# Patient Record
Sex: Female | Born: 1955 | Race: White | Hispanic: No | Marital: Married | Smoking: Never smoker
Health system: Southern US, Community
[De-identification: ages and names within clinical notes are randomized; demographics above are authoritative.]

## PROBLEM LIST (undated history)

## (undated) DIAGNOSIS — K602 Anal fissure, unspecified: Secondary | ICD-10-CM

## (undated) DIAGNOSIS — E785 Hyperlipidemia, unspecified: Secondary | ICD-10-CM

## (undated) DIAGNOSIS — K760 Fatty (change of) liver, not elsewhere classified: Secondary | ICD-10-CM

## (undated) DIAGNOSIS — M797 Fibromyalgia: Secondary | ICD-10-CM

## (undated) DIAGNOSIS — C50919 Malignant neoplasm of unspecified site of unspecified female breast: Secondary | ICD-10-CM

## (undated) DIAGNOSIS — F32A Depression, unspecified: Secondary | ICD-10-CM

## (undated) DIAGNOSIS — I499 Cardiac arrhythmia, unspecified: Secondary | ICD-10-CM

## (undated) DIAGNOSIS — D126 Benign neoplasm of colon, unspecified: Secondary | ICD-10-CM

## (undated) DIAGNOSIS — F329 Major depressive disorder, single episode, unspecified: Secondary | ICD-10-CM

## (undated) DIAGNOSIS — R002 Palpitations: Secondary | ICD-10-CM

## (undated) DIAGNOSIS — Z9221 Personal history of antineoplastic chemotherapy: Secondary | ICD-10-CM

## (undated) DIAGNOSIS — R51 Headache: Secondary | ICD-10-CM

## (undated) DIAGNOSIS — G4730 Sleep apnea, unspecified: Secondary | ICD-10-CM

## (undated) DIAGNOSIS — H269 Unspecified cataract: Secondary | ICD-10-CM

## (undated) DIAGNOSIS — J189 Pneumonia, unspecified organism: Secondary | ICD-10-CM

## (undated) DIAGNOSIS — Z87440 Personal history of urinary (tract) infections: Secondary | ICD-10-CM

## (undated) DIAGNOSIS — I10 Essential (primary) hypertension: Secondary | ICD-10-CM

## (undated) DIAGNOSIS — I253 Aneurysm of heart: Secondary | ICD-10-CM

## (undated) DIAGNOSIS — K635 Polyp of colon: Secondary | ICD-10-CM

## (undated) DIAGNOSIS — C4490 Unspecified malignant neoplasm of skin, unspecified: Secondary | ICD-10-CM

## (undated) DIAGNOSIS — Z923 Personal history of irradiation: Secondary | ICD-10-CM

## (undated) DIAGNOSIS — E669 Obesity, unspecified: Secondary | ICD-10-CM

## (undated) DIAGNOSIS — E039 Hypothyroidism, unspecified: Secondary | ICD-10-CM

## (undated) HISTORY — PX: POLYPECTOMY: SHX149

## (undated) HISTORY — DX: Personal history of urinary (tract) infections: Z87.440

## (undated) HISTORY — PX: KNEE ARTHROSCOPY: SUR90

## (undated) HISTORY — DX: Fibromyalgia: M79.7

## (undated) HISTORY — DX: Malignant neoplasm of unspecified site of unspecified female breast: C50.919

## (undated) HISTORY — PX: BREAST LUMPECTOMY: SHX2

## (undated) HISTORY — DX: Major depressive disorder, single episode, unspecified: F32.9

## (undated) HISTORY — DX: Palpitations: R00.2

## (undated) HISTORY — PX: COLONOSCOPY: SHX174

## (undated) HISTORY — DX: Aneurysm of heart: I25.3

## (undated) HISTORY — DX: Headache: R51

## (undated) HISTORY — DX: Obesity, unspecified: E66.9

## (undated) HISTORY — DX: Essential (primary) hypertension: I10

## (undated) HISTORY — DX: Anal fissure, unspecified: K60.2

## (undated) HISTORY — DX: Unspecified malignant neoplasm of skin, unspecified: C44.90

## (undated) HISTORY — DX: Benign neoplasm of colon, unspecified: D12.6

## (undated) HISTORY — PX: SKIN CANCER EXCISION: SHX779

## (undated) HISTORY — DX: Depression, unspecified: F32.A

## (undated) HISTORY — DX: Hyperlipidemia, unspecified: E78.5

## (undated) HISTORY — DX: Unspecified cataract: H26.9

## (undated) HISTORY — PX: TONSILLECTOMY: SHX5217

## (undated) HISTORY — DX: Polyp of colon: K63.5

## (undated) HISTORY — DX: Hypothyroidism, unspecified: E03.9

---
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
## 1976-05-12 HISTORY — PX: DILATION AND CURETTAGE OF UTERUS: SHX78

## 1981-05-12 HISTORY — PX: ECTOPIC PREGNANCY SURGERY: SHX613

## 1998-01-09 ENCOUNTER — Ambulatory Visit (HOSPITAL_COMMUNITY): Admission: RE | Admit: 1998-01-09 | Discharge: 1998-01-09 | Payer: Self-pay | Admitting: Gynecology

## 1999-06-14 ENCOUNTER — Encounter: Payer: Self-pay | Admitting: Gastroenterology

## 1999-06-14 ENCOUNTER — Ambulatory Visit (HOSPITAL_COMMUNITY): Admission: RE | Admit: 1999-06-14 | Discharge: 1999-06-14 | Payer: Self-pay | Admitting: Gastroenterology

## 1999-07-24 ENCOUNTER — Ambulatory Visit (HOSPITAL_COMMUNITY): Admission: RE | Admit: 1999-07-24 | Discharge: 1999-07-24 | Payer: Self-pay | Admitting: Gynecology

## 1999-07-24 ENCOUNTER — Encounter: Payer: Self-pay | Admitting: Gynecology

## 1999-08-26 ENCOUNTER — Other Ambulatory Visit: Admission: RE | Admit: 1999-08-26 | Discharge: 1999-08-26 | Payer: Self-pay | Admitting: Gynecology

## 2000-04-11 ENCOUNTER — Emergency Department (HOSPITAL_COMMUNITY): Admission: EM | Admit: 2000-04-11 | Discharge: 2000-04-11 | Payer: Self-pay | Admitting: Emergency Medicine

## 2000-08-05 ENCOUNTER — Other Ambulatory Visit: Admission: RE | Admit: 2000-08-05 | Discharge: 2000-08-05 | Payer: Self-pay | Admitting: Obstetrics and Gynecology

## 2000-09-02 ENCOUNTER — Ambulatory Visit (HOSPITAL_BASED_OUTPATIENT_CLINIC_OR_DEPARTMENT_OTHER): Admission: RE | Admit: 2000-09-02 | Discharge: 2000-09-02 | Payer: Self-pay | Admitting: Plastic Surgery

## 2000-09-02 ENCOUNTER — Encounter (INDEPENDENT_AMBULATORY_CARE_PROVIDER_SITE_OTHER): Payer: Self-pay | Admitting: *Deleted

## 2000-09-04 ENCOUNTER — Encounter: Payer: Self-pay | Admitting: Gastroenterology

## 2000-09-04 ENCOUNTER — Ambulatory Visit (HOSPITAL_COMMUNITY): Admission: RE | Admit: 2000-09-04 | Discharge: 2000-09-04 | Payer: Self-pay | Admitting: Gastroenterology

## 2000-09-09 ENCOUNTER — Encounter (INDEPENDENT_AMBULATORY_CARE_PROVIDER_SITE_OTHER): Payer: Self-pay | Admitting: Specialist

## 2000-09-09 ENCOUNTER — Other Ambulatory Visit: Admission: RE | Admit: 2000-09-09 | Discharge: 2000-09-09 | Payer: Self-pay | Admitting: Gastroenterology

## 2001-08-31 ENCOUNTER — Encounter: Payer: Self-pay | Admitting: Obstetrics and Gynecology

## 2001-08-31 ENCOUNTER — Encounter: Admission: RE | Admit: 2001-08-31 | Discharge: 2001-08-31 | Payer: Self-pay | Admitting: Obstetrics and Gynecology

## 2001-09-28 ENCOUNTER — Other Ambulatory Visit: Admission: RE | Admit: 2001-09-28 | Discharge: 2001-09-28 | Payer: Self-pay | Admitting: Obstetrics and Gynecology

## 2004-03-20 ENCOUNTER — Other Ambulatory Visit: Admission: RE | Admit: 2004-03-20 | Discharge: 2004-03-20 | Payer: Self-pay | Admitting: Obstetrics and Gynecology

## 2004-05-17 ENCOUNTER — Ambulatory Visit: Payer: Self-pay | Admitting: Internal Medicine

## 2004-07-05 ENCOUNTER — Ambulatory Visit: Payer: Self-pay | Admitting: Internal Medicine

## 2004-08-02 ENCOUNTER — Ambulatory Visit: Payer: Self-pay | Admitting: Internal Medicine

## 2004-12-10 ENCOUNTER — Ambulatory Visit: Payer: Self-pay | Admitting: Internal Medicine

## 2004-12-10 ENCOUNTER — Ambulatory Visit (HOSPITAL_COMMUNITY): Admission: RE | Admit: 2004-12-10 | Discharge: 2004-12-10 | Payer: Self-pay | Admitting: Internal Medicine

## 2005-03-11 ENCOUNTER — Ambulatory Visit: Payer: Self-pay | Admitting: Internal Medicine

## 2005-06-03 ENCOUNTER — Ambulatory Visit: Payer: Self-pay | Admitting: Internal Medicine

## 2005-06-25 ENCOUNTER — Other Ambulatory Visit: Admission: RE | Admit: 2005-06-25 | Discharge: 2005-06-25 | Payer: Self-pay | Admitting: Obstetrics and Gynecology

## 2005-08-11 ENCOUNTER — Ambulatory Visit: Payer: Self-pay | Admitting: Internal Medicine

## 2005-09-02 ENCOUNTER — Ambulatory Visit: Payer: Self-pay | Admitting: Internal Medicine

## 2005-09-08 ENCOUNTER — Emergency Department (HOSPITAL_COMMUNITY): Admission: EM | Admit: 2005-09-08 | Discharge: 2005-09-08 | Payer: Self-pay | Admitting: Emergency Medicine

## 2005-09-29 ENCOUNTER — Encounter: Admission: RE | Admit: 2005-09-29 | Discharge: 2005-09-29 | Payer: Self-pay | Admitting: Orthopedic Surgery

## 2005-10-21 ENCOUNTER — Ambulatory Visit: Payer: Self-pay | Admitting: Internal Medicine

## 2005-11-18 ENCOUNTER — Encounter: Admission: RE | Admit: 2005-11-18 | Discharge: 2005-11-18 | Payer: Self-pay | Admitting: Orthopedic Surgery

## 2005-12-15 ENCOUNTER — Ambulatory Visit: Payer: Self-pay | Admitting: Internal Medicine

## 2005-12-23 ENCOUNTER — Ambulatory Visit: Payer: Self-pay | Admitting: Internal Medicine

## 2006-01-20 ENCOUNTER — Ambulatory Visit: Payer: Self-pay | Admitting: Internal Medicine

## 2006-06-15 ENCOUNTER — Ambulatory Visit: Payer: Self-pay | Admitting: Internal Medicine

## 2006-06-29 ENCOUNTER — Ambulatory Visit: Payer: Self-pay | Admitting: Internal Medicine

## 2006-07-02 ENCOUNTER — Ambulatory Visit: Payer: Self-pay | Admitting: Internal Medicine

## 2006-07-09 ENCOUNTER — Ambulatory Visit: Payer: Self-pay | Admitting: Internal Medicine

## 2006-07-09 LAB — CONVERTED CEMR LAB
Bilirubin, Direct: 0.1 mg/dL (ref 0.0–0.3)
Cholesterol: 215 mg/dL (ref 0–200)
Creatinine, Ser: 0.9 mg/dL (ref 0.4–1.2)
GFR calc Af Amer: 85 mL/min
HDL: 55.1 mg/dL (ref >39.0)
Hgb A1c MFr Bld: 5.6 % (ref 4.6–6.0)
Sodium: 139 meq/L (ref 135–145)
Total CHOL/HDL Ratio: 3.9
Total Protein: 6.9 g/dL (ref 6.0–8.3)

## 2006-08-21 LAB — HM COLONOSCOPY

## 2006-10-30 ENCOUNTER — Ambulatory Visit: Payer: Self-pay | Admitting: Family Medicine

## 2006-11-11 ENCOUNTER — Encounter: Payer: Self-pay | Admitting: Internal Medicine

## 2006-11-11 DIAGNOSIS — G43009 Migraine without aura, not intractable, without status migrainosus: Secondary | ICD-10-CM | POA: Insufficient documentation

## 2006-11-11 DIAGNOSIS — K219 Gastro-esophageal reflux disease without esophagitis: Secondary | ICD-10-CM | POA: Insufficient documentation

## 2006-11-11 DIAGNOSIS — F329 Major depressive disorder, single episode, unspecified: Secondary | ICD-10-CM | POA: Insufficient documentation

## 2006-11-11 DIAGNOSIS — E039 Hypothyroidism, unspecified: Secondary | ICD-10-CM | POA: Insufficient documentation

## 2006-11-11 DIAGNOSIS — R748 Abnormal levels of other serum enzymes: Secondary | ICD-10-CM | POA: Insufficient documentation

## 2006-11-11 DIAGNOSIS — IMO0001 Reserved for inherently not codable concepts without codable children: Secondary | ICD-10-CM | POA: Insufficient documentation

## 2007-01-27 ENCOUNTER — Telehealth: Payer: Self-pay | Admitting: *Deleted

## 2007-01-28 ENCOUNTER — Ambulatory Visit: Payer: Self-pay | Admitting: Internal Medicine

## 2007-01-28 DIAGNOSIS — R7309 Other abnormal glucose: Secondary | ICD-10-CM | POA: Insufficient documentation

## 2007-01-28 DIAGNOSIS — R42 Dizziness and giddiness: Secondary | ICD-10-CM | POA: Insufficient documentation

## 2007-01-28 DIAGNOSIS — I10 Essential (primary) hypertension: Secondary | ICD-10-CM | POA: Insufficient documentation

## 2007-03-01 ENCOUNTER — Telehealth: Payer: Self-pay | Admitting: Internal Medicine

## 2007-03-01 ENCOUNTER — Telehealth: Payer: Self-pay | Admitting: *Deleted

## 2007-03-31 ENCOUNTER — Telehealth: Payer: Self-pay | Admitting: Internal Medicine

## 2007-05-03 ENCOUNTER — Ambulatory Visit: Payer: Self-pay | Admitting: Internal Medicine

## 2007-05-03 DIAGNOSIS — R51 Headache: Secondary | ICD-10-CM | POA: Insufficient documentation

## 2007-05-03 DIAGNOSIS — R519 Headache, unspecified: Secondary | ICD-10-CM | POA: Insufficient documentation

## 2007-05-10 LAB — CONVERTED CEMR LAB
ALT: 53 units/L — ABNORMAL HIGH (ref 0–35)
Albumin: 4 g/dL (ref 3.5–5.2)
Alkaline Phosphatase: 119 units/L — ABNORMAL HIGH (ref 39–117)
BUN: 15 mg/dL (ref 6–23)
Basophils Absolute: 0 10*3/uL (ref 0.0–0.1)
Basophils Relative: 0.1 % (ref 0.0–1.0)
CO2: 29 meq/L (ref 19–32)
Calcium: 9.5 mg/dL (ref 8.4–10.5)
Chloride: 100 meq/L (ref 96–112)
Creatinine, Ser: 0.8 mg/dL (ref 0.4–1.2)
Eosinophils Absolute: 0.2 10*3/uL (ref 0.0–0.6)
Eosinophils Relative: 1.8 % (ref 0.0–5.0)
GFR calc Af Amer: 96 mL/min
GFR calc non Af Amer: 80 mL/min
Glucose, Bld: 83 mg/dL (ref 70–99)
HCT: 37.2 % (ref 36.0–46.0)
Monocytes Absolute: 0.6 10*3/uL (ref 0.2–0.7)
Neutrophils Relative %: 57 % (ref 43.0–77.0)
Platelets: 215 10*3/uL (ref 150–400)
RBC: 4.11 M/uL (ref 3.87–5.11)
Sodium: 138 meq/L (ref 135–145)
WBC: 9.8 10*3/uL (ref 4.5–10.5)

## 2007-07-02 ENCOUNTER — Ambulatory Visit: Payer: Self-pay | Admitting: Internal Medicine

## 2007-07-02 DIAGNOSIS — J310 Chronic rhinitis: Secondary | ICD-10-CM | POA: Insufficient documentation

## 2007-07-06 ENCOUNTER — Telehealth: Payer: Self-pay | Admitting: Internal Medicine

## 2007-07-09 ENCOUNTER — Ambulatory Visit: Payer: Self-pay | Admitting: Internal Medicine

## 2007-07-09 DIAGNOSIS — R5381 Other malaise: Secondary | ICD-10-CM | POA: Insufficient documentation

## 2007-07-09 DIAGNOSIS — R5383 Other fatigue: Secondary | ICD-10-CM

## 2007-07-19 LAB — CONVERTED CEMR LAB
ALT: 59 units/L — ABNORMAL HIGH (ref 0–35)
AST: 27 units/L (ref 0–37)
Albumin: 4.3 g/dL (ref 3.5–5.2)
Bilirubin, Direct: 0.1 mg/dL (ref 0.0–0.3)
CO2: 26 meq/L (ref 19–32)
Calcium: 9.2 mg/dL (ref 8.4–10.5)
Chloride: 101 meq/L (ref 96–112)
Creatinine, Ser: 0.76 mg/dL (ref 0.40–1.20)
GGT: 111 units/L — ABNORMAL HIGH (ref 7–51)
Lymphs Abs: 3 10*3/uL (ref 0.7–4.0)
MCHC: 32.2 g/dL (ref 30.0–36.0)
Monocytes Relative: 7 % (ref 3–12)
Neutro Abs: 4.7 10*3/uL (ref 1.7–7.7)
Potassium: 4.1 meq/L (ref 3.5–5.3)
RBC: 4.19 M/uL (ref 3.87–5.11)
TSH: 0.745 microintl units/mL (ref 0.350–5.50)
Total Protein: 7.1 g/dL (ref 6.0–8.3)
WBC: 8.6 10*3/uL (ref 4.0–10.5)

## 2007-11-04 ENCOUNTER — Encounter: Payer: Self-pay | Admitting: Internal Medicine

## 2007-11-04 ENCOUNTER — Emergency Department (HOSPITAL_COMMUNITY): Admission: EM | Admit: 2007-11-04 | Discharge: 2007-11-04 | Payer: Self-pay | Admitting: Emergency Medicine

## 2007-11-05 ENCOUNTER — Ambulatory Visit: Payer: Self-pay | Admitting: Internal Medicine

## 2007-11-08 ENCOUNTER — Ambulatory Visit: Payer: Self-pay | Admitting: Internal Medicine

## 2007-12-06 ENCOUNTER — Ambulatory Visit: Payer: Self-pay | Admitting: Internal Medicine

## 2008-03-22 ENCOUNTER — Telehealth: Payer: Self-pay | Admitting: Internal Medicine

## 2008-11-06 ENCOUNTER — Ambulatory Visit: Payer: Self-pay | Admitting: Internal Medicine

## 2008-11-06 LAB — CONVERTED CEMR LAB
ALT: 33 units/L (ref 0–35)
Albumin: 3.8 g/dL (ref 3.5–5.2)
Alkaline Phosphatase: 100 units/L (ref 39–117)
Basophils Absolute: 0.1 10*3/uL (ref 0.0–0.1)
Basophils Relative: 0.8 % (ref 0.0–3.0)
Bilirubin Urine: NEGATIVE
Bilirubin, Direct: 0.1 mg/dL (ref 0.0–0.3)
CO2: 31 meq/L (ref 19–32)
Chloride: 105 meq/L (ref 96–112)
Creatinine, Ser: 0.7 mg/dL (ref 0.4–1.2)
Direct LDL: 155.1 mg/dL
Eosinophils Absolute: 0.1 10*3/uL (ref 0.0–0.7)
Glucose, Bld: 97 mg/dL (ref 70–99)
Glucose, Urine, Semiquant: NEGATIVE
Ketones, urine, test strip: NEGATIVE
Lymphocytes Relative: 31.4 % (ref 12.0–46.0)
Lymphs Abs: 2.9 10*3/uL (ref 0.7–4.0)
MCHC: 33.5 g/dL (ref 30.0–36.0)
Monocytes Relative: 5.3 % (ref 3.0–12.0)
Neutrophils Relative %: 61.2 % (ref 43.0–77.0)
Platelets: 248 10*3/uL (ref 150.0–400.0)
RBC: 4.3 M/uL (ref 3.87–5.11)
RDW: 13.4 % (ref 11.5–14.6)
Specific Gravity, Urine: 1.02
TSH: 1.5 microintl units/mL (ref 0.35–5.50)
Urobilinogen, UA: 0.2
WBC: 9.3 10*3/uL (ref 4.5–10.5)
pH: 7

## 2008-11-10 ENCOUNTER — Ambulatory Visit: Payer: Self-pay | Admitting: Internal Medicine

## 2008-11-24 ENCOUNTER — Telehealth: Payer: Self-pay | Admitting: *Deleted

## 2008-12-15 ENCOUNTER — Telehealth: Payer: Self-pay | Admitting: *Deleted

## 2009-03-28 ENCOUNTER — Ambulatory Visit: Payer: Self-pay | Admitting: Internal Medicine

## 2009-04-03 ENCOUNTER — Telehealth: Payer: Self-pay | Admitting: *Deleted

## 2009-04-04 ENCOUNTER — Emergency Department (HOSPITAL_BASED_OUTPATIENT_CLINIC_OR_DEPARTMENT_OTHER): Admission: EM | Admit: 2009-04-04 | Discharge: 2009-04-04 | Payer: Self-pay | Admitting: Emergency Medicine

## 2009-04-04 ENCOUNTER — Telehealth: Payer: Self-pay | Admitting: Internal Medicine

## 2009-06-11 ENCOUNTER — Ambulatory Visit: Payer: Self-pay | Admitting: Internal Medicine

## 2009-06-11 ENCOUNTER — Telehealth: Payer: Self-pay | Admitting: *Deleted

## 2009-06-11 DIAGNOSIS — R259 Unspecified abnormal involuntary movements: Secondary | ICD-10-CM | POA: Insufficient documentation

## 2009-06-11 DIAGNOSIS — S139XXA Sprain of joints and ligaments of unspecified parts of neck, initial encounter: Secondary | ICD-10-CM | POA: Insufficient documentation

## 2009-06-11 DIAGNOSIS — R209 Unspecified disturbances of skin sensation: Secondary | ICD-10-CM | POA: Insufficient documentation

## 2010-02-18 ENCOUNTER — Ambulatory Visit: Payer: Self-pay | Admitting: Internal Medicine

## 2010-02-18 DIAGNOSIS — J309 Allergic rhinitis, unspecified: Secondary | ICD-10-CM | POA: Insufficient documentation

## 2010-02-18 DIAGNOSIS — E559 Vitamin D deficiency, unspecified: Secondary | ICD-10-CM | POA: Insufficient documentation

## 2010-02-18 DIAGNOSIS — E669 Obesity, unspecified: Secondary | ICD-10-CM | POA: Insufficient documentation

## 2010-02-18 DIAGNOSIS — M722 Plantar fascial fibromatosis: Secondary | ICD-10-CM | POA: Insufficient documentation

## 2010-02-18 LAB — CONVERTED CEMR LAB: Vit D, 25-Hydroxy: 57 ng/mL (ref 30–89)

## 2010-02-22 LAB — CONVERTED CEMR LAB
ALT: 42 units/L — ABNORMAL HIGH (ref 0–35)
Basophils Relative: 0.5 % (ref 0.0–3.0)
CO2: 31 meq/L (ref 19–32)
GFR calc non Af Amer: 62.38 mL/min (ref >60)
Glucose, Bld: 108 mg/dL — ABNORMAL HIGH (ref 70–99)
HCT: 38.7 % (ref 36.0–46.0)
Hemoglobin: 13.2 g/dL (ref 12.0–15.0)
Lymphocytes Relative: 30.3 % (ref 12.0–46.0)
MCHC: 34.2 g/dL (ref 30.0–36.0)
Neutrophils Relative %: 61.7 % (ref 43.0–77.0)
RBC: 4.28 M/uL (ref 3.87–5.11)
TSH: 0.58 microintl units/mL (ref 0.35–5.50)
Total Bilirubin: 0.5 mg/dL (ref 0.3–1.2)
Total Protein: 7.1 g/dL (ref 6.0–8.3)
WBC: 9.2 10*3/uL (ref 4.5–10.5)

## 2010-03-15 ENCOUNTER — Ambulatory Visit: Payer: Self-pay | Admitting: Internal Medicine

## 2010-03-15 DIAGNOSIS — H8110 Benign paroxysmal vertigo, unspecified ear: Secondary | ICD-10-CM | POA: Insufficient documentation

## 2010-03-15 LAB — CONVERTED CEMR LAB: Hemoglobin: 13.4 g/dL

## 2010-03-18 ENCOUNTER — Telehealth: Payer: Self-pay | Admitting: Internal Medicine

## 2010-03-26 ENCOUNTER — Telehealth: Payer: Self-pay | Admitting: Internal Medicine

## 2010-04-08 ENCOUNTER — Emergency Department (HOSPITAL_BASED_OUTPATIENT_CLINIC_OR_DEPARTMENT_OTHER): Admission: EM | Admit: 2010-04-08 | Discharge: 2010-04-08 | Payer: Self-pay | Admitting: Emergency Medicine

## 2010-04-08 ENCOUNTER — Telehealth: Payer: Self-pay | Admitting: Internal Medicine

## 2010-04-09 ENCOUNTER — Telehealth: Payer: Self-pay | Admitting: Internal Medicine

## 2010-04-11 ENCOUNTER — Encounter: Payer: Self-pay | Admitting: Internal Medicine

## 2010-05-08 ENCOUNTER — Ambulatory Visit
Admission: RE | Admit: 2010-05-08 | Discharge: 2010-05-08 | Payer: Self-pay | Source: Home / Self Care | Attending: Internal Medicine | Admitting: Internal Medicine

## 2010-05-08 DIAGNOSIS — F438 Other reactions to severe stress: Secondary | ICD-10-CM | POA: Insufficient documentation

## 2010-05-23 ENCOUNTER — Ambulatory Visit
Admission: RE | Admit: 2010-05-23 | Discharge: 2010-05-23 | Payer: Self-pay | Source: Home / Self Care | Attending: Internal Medicine | Admitting: Internal Medicine

## 2010-05-23 ENCOUNTER — Other Ambulatory Visit: Payer: Self-pay | Admitting: Internal Medicine

## 2010-05-24 ENCOUNTER — Telehealth: Payer: Self-pay | Admitting: *Deleted

## 2010-05-24 LAB — BASIC METABOLIC PANEL
BUN: 20 mg/dL (ref 6–23)
CO2: 31 mEq/L (ref 19–32)
Calcium: 9.5 mg/dL (ref 8.4–10.5)
Chloride: 101 mEq/L (ref 96–112)
Creatinine, Ser: 0.8 mg/dL (ref 0.4–1.2)
GFR: 79.91 mL/min (ref >60.00)
Glucose, Bld: 85 mg/dL (ref 70–99)
Potassium: 3.9 mEq/L (ref 3.5–5.1)
Sodium: 140 mEq/L (ref 135–145)

## 2010-05-24 LAB — HEPATIC FUNCTION PANEL
ALT: 26 U/L (ref 0–35)
AST: 24 U/L (ref 0–37)
Albumin: 4.4 g/dL (ref 3.5–5.2)
Alkaline Phosphatase: 106 U/L (ref 39–117)
Bilirubin, Direct: 0.1 mg/dL (ref 0.0–0.3)
Total Bilirubin: 0.9 mg/dL (ref 0.3–1.2)
Total Protein: 7.2 g/dL (ref 6.0–8.3)

## 2010-05-24 LAB — TSH: TSH: 0.81 u[IU]/mL (ref 0.35–5.50)

## 2010-06-10 ENCOUNTER — Other Ambulatory Visit: Payer: Self-pay | Admitting: Internal Medicine

## 2010-06-10 ENCOUNTER — Ambulatory Visit
Admission: RE | Admit: 2010-06-10 | Discharge: 2010-06-10 | Payer: Self-pay | Source: Home / Self Care | Attending: Internal Medicine | Admitting: Internal Medicine

## 2010-06-10 ENCOUNTER — Encounter: Payer: Self-pay | Admitting: Internal Medicine

## 2010-06-10 DIAGNOSIS — G478 Other sleep disorders: Secondary | ICD-10-CM | POA: Insufficient documentation

## 2010-06-10 DIAGNOSIS — R252 Cramp and spasm: Secondary | ICD-10-CM | POA: Insufficient documentation

## 2010-06-10 LAB — CONVERTED CEMR LAB: Vit D, 25-Hydroxy: 49 ng/mL (ref 30–89)

## 2010-06-11 LAB — MAGNESIUM: Magnesium: 2.2 mg/dL (ref 1.5–2.5)

## 2010-06-11 NOTE — Progress Notes (Signed)
Summary: ED Visit from 11/28   Leel[place], [place]thorne - MRN: [id number] Acct#: [id number] PHYSICIAN DOCUMENTATION Claudia Pollock Nov 28 16:41:55 EST 2011 [facility] [address] [place], [place] [address] PHONE: [phone] MRN: [id number] Account #: [id number] Name: [name], [name] Sex: F Age: 55 DOB: 1955/10/14 Complaint: Dizziness Primary Diagnosis: Vertigo Arrival Time: 04/08/2010 14:10 Discharge Time: 04/08/2010 16:41 All Providers: Dr. Annye Rusk - MD PROVIDER: Dr. Annye Rusk - MD HPI: The patient is a 55 year old female who presents with a chief complaint of dizziness. The history was provided by the patient and EMS. Pt states she has had increasing dizziness over the past several weeks and is being evaluated by her PCP for vertigo. Pt states she awoke this AM and felt "weak." Pt went to a class and states while she was sititing at her desk, she became very dizzy. Staff state that the pt was pale and diaphoretic. Pt states she was very dizzy and became mildly nausiatesd and felt like she was going to pass out. Pt laid down and had significant improvement in sx. Pt states sx return when standing. Pt denies CP or SOB. No vomiting or diarrhea, no vaginal bleeding or discharge. No recent febrile illness. Pt states like previous sx, only more intense. The dizziness started just prior to arrival. The onset was acute. The Pattern is episodic. The Course is improving. It is characterized as lightheadedness. The symptoms are described as moderate. The condition is aggravated by nothing. The condition is relieved by lying still. The symptoms have been associated with no other complaints. The patient has a significant history of similar symptoms previously and recently being evaluated for this complaint, while there is no significant history of serious medical conditions, recent sick contacts, multiple prior evals for same or medication noncompliance. 14:53 04/08/2010 by  Annye Rusk - MD, Dr. Linus Orn: Statement: all systems negative except as marked or noted in the HPI Constitutional: Negative for fever and weight loss. Eyes: all Negative ENMT: Negative for sore throat. Cardiovascular: Negative for chest pain, palpitations and dyspnea. Respiratory: Negative for cough and wheezing. Gastrointestinal: Negative for nausea, vomiting and diarrhea. Genitourinary: Negative for dysuria and Urgency. Musculoskeletal: Negative for arthralgias and myalgias. Skin: Negative for rash. Neuro: Positive for Weakness, dizziness and lightheadedness. Negative for headache and confusion. Psychiatric: all Negative 14:53 04/08/2010 by Annye Rusk - MD, Dr. Earl Lagos: 1 [name], [name] - MRN: [id number] Acct#: [id number] Documentation: physician reviewed/amended Historian: patient Patient's Current Physicians Patient's Current Physicians (please list PCP first) Denya Buckingham - IM, [name] Last normal period: 10/04/2007 Past medical history: hypertension, acid reflux, ectopic pregnancy, hypothyroid, urinary tract infection, vertigo Family History: none Surgical History: ectopic pregnancy requiring surgery, tonsillectomy Social History: non-smoker, non-drinker, no drug abuse TB Screen: no symptoms present Travel History: no recent air travel Contraception: nothing Immunization status: tetanus unknown Special Needs: none Allergies Drug Reaction Allergy Note Penicillins angioedema Cephalosporins angioedema new diagnosis 04/04/2009 14:21 04/08/2010 by Annye Rusk - MD, Dr. Home Medications: Documentation: nurse entered directly Medications Medication [Medication] Dosage Frequency Last Dose levothyroxine Oral DIOvan HCT Oral Other sulfa drug for staph infection 14:21 04/08/2010 by Annye Rusk - MD, Dr. Physical examination: Vital signs and O2 SAT: reviewed Constitutional: well developed, well nourished, well hydrated, in no acute distress Head and Face:  normocephalic, atraumatic Eyes: normal appearance, EOMI, PERRL NOTE - Nystagmus when looking left. ENMT: ears, nose and throat normal, mouth and pharynx normal Neck: supple, no lymphadenopathy Spine: cervical spine non-tender Cardiovascular: regular rate and rhythm, no murmur,  rub, or gallop Respiratory: breath sounds clear & equal bilaterally, no rales, no rhonchi, no wheezes Chest: nontender Abdomen: soft, nontender, nondistended NOTE - No peritoneal sx 2 [name], [name] - MRN: [id number] Acct#: [id number] Extremities: no tenderness, no edema Neuro: AA&Ox3, motor intact in all extremities, sensation normal , normal speech, Cranial Nerves II-XII intact Skin: color normal, no rash, no petechiae, warm, dry Psychiatric: AA&Ox4 Lymph: no palpable or tender nodes 14:54 04/08/2010 by Annye Rusk - MD, Dr. Reviewed result: Result Type: Cleda Daub: [id number] Step Type: LAB Procedure Name: URINE MACROSCOPIC Procedure: URINE MACROSCOPIC Result: URINE COLOR YELLOW [YELLOW] URINE APPEARANCE CLEAR [CLEAR] URINE SPEC GRAVITY 1.006 [1.005-1.030] URINE PH 6.5 [5.0-8.0] URINE GLUCOSE NEGATIVE mg/dL [NEG] URINE HEMOGLOBIN SMALL [NEG] A URINE BILIRUBIN NEGATIVE [NEG] URINE KETONES NEGATIVE mg/dL [NEG] URINE TOTAL PROTEIN NEGATIVE mg/dL [NEG] URINE UROBILINOGEN 0.2 mg/dL [3.0-8.6] URINE NITRITE NEGATIVE [NEG] LEUKOCYTE ESTERASE NEGATIVE [NEG] 15:01 04/08/2010 by Annye Rusk - MD, Dr. Reviewed result: Result Type: Cleda Daub: [id number] Step Type: LAB Procedure Name: URINE MICROSCOPIC Procedure: URINE MICROSCOPIC Result: URINE EPITHELIAL RARE [RARE] URINE RBC'S 3-6 RBC/hpf [<3] BACTERIA RARE [RARE] 15:01 04/08/2010 by Annye Rusk - MD, Dr. Reviewed result: Result Type: Cleda Daub: [id number] Step Type: LAB 3 [name], [name] - MRN: [id number] Acct#: [id number] Procedure Name: CBC WITH DIFF Procedure: CBC WITH DIFF Result: WBC COUNT 10.3 K/uL  [4.0-10.5] RBC COUNT 4.27 MIL/uL [3.87-5.11] HEMOGLOBIN 12.9 g/dL [36.6-44.0] HEMATOCRIT 37.6 % [36.0-46.0] MCV 87.9 fL [78.0-100.0] MCH 30.3 pg [26.0-34.0] MCHC 34.4 g/dL [34.7-42.5] RDW 95.6 % [11.5-15.5] PLATELET COUNT 219 K/uL [150-400] NEUTROPHIL 72 % [43-77] ABS GRANULOCYTE 7.3 K/uL [1.7-7.7] LYMPHOCYTE 22 % [12-46] ABS LYMPH 2.3 K/uL [0.7-4.0] MONOCYTE 5 % [3-12] ABS MONOCYTE 0.5 K/uL [0.1-1.0] EOSINOPHIL 1 % [0-5] ABS EOS 0.1 K/uL [0.0-0.7] BASOPHIL 1 % [0-1] ABS BASO 0.1 K/uL [0.0-0.1] 15:22 04/08/2010 by Annye Rusk - MD, Dr. Reviewed result: Result Type: Cleda Daub: [id number] Step Type: LAB Procedure Name: BASIC METABOLIC PANEL Procedure: BASIC METABOLIC PANEL Procedure Notes: GFR, Est Afr Am - The eGFR has been calculated using the MDRD equation. This calculation has not been validated in all clinical situations. eGFR's persistently <60 mL/min signify possible Chronic Kidney Disease. Result: SODIUM 143 mEq/L [135-145] POTASSIUM 3.8 mEq/L [3.5-5.1] CHLORIDE 107 mEq/L [96-112] CARBON DIOXIDE 22 mEq/L [19-32] GLUCOSE 100 mg/dL [29-51] H BUN 20 mg/dL [8-84] CREATININE 1.6 mg/dL [1.6-6.0] H CALCIUM 8.8 mg/dL [6.3-01.6] GFR, Est Non Af Am 33 mL/min [>60] L GFR, Est Afr Am 40 mL/min [>60] L 15:22 04/08/2010 by Annye Rusk - MD, Dr. Dian Situ [name], [name] - MRN: [id number] Acct#: [id number] Cardiac: EKG EKG Time Rate Interpretation 02:31 PM rate 81 BPM normal sinus rhythm; normal axis; normal intervals; normal PQRS; normal ST and T waves. Interpretation: normal. Compared with previous EKG Date Comparison Comments No prior EKG available for comparison. 15:35 04/08/2010 by Annye Rusk - MD, Dr. Patient disposition: Patient disposition: Disch - Home Primary Diagnosis: vertigo Counseling: advised of diagnosis, advised of treatment plan, advised of need for close follow- up, advised of need to return for worsening or changing symptoms,  advised of specific symptoms that should prompt their return, patient voices understanding 15:38 04/08/2010 by Annye Rusk - MD, Dr. Prescriptions: Prescription Medication Dispense Sig Line meclizine 25 mg Tab 20 (twenty) One PO q 6 hrs PRN dizziness 15:38 04/08/2010 by Annye Rusk - MD, Dr. Medication disposition: Medications Medication [Medication] Dosage Frequency Last Dose Medication disposition PCP contact levothyroxine Oral continue DIOvan HCT Oral continue Other sulfa drug for staph  infection continue 15:38 04/08/2010 by Annye Rusk - MD, Dr. Discharge: 5 Nakari, Bracknell - MRN: [id number] Acct#: [id number] Discharge Instructions: importance of primary care doctor (edu), vertigo - nonspecific, *resource guide Append a Note to Discharge Instructions: Please follow up with your physician in the next 2-3 days. Return to the Emergency Department for persistant fever >100.4 not controlled with tylenol or ibuprofen, chest pain, shortness of breath, increasing abdominal pain, inability to tolerate fluids by mouth, or worsening of your condition. Referral/Appointment Refer Patient To: Phone Number: Follow-up in Appointment Details: [facility] [phone] [facility] [phone] (bet 10-12 & 3-5) Physician Referral Service [phone] [facility] [phone] [facility] [phone] Lajuana Ripple [phone] 3 days 15:39 04/08/2010 by Annye Rusk - MD, Dr. ED Course: Comments: Pt appears to have peripherial vertigo. Pt with non-focal neuro exam, and states that when she was younger she would get very car sick on family trips and instead of vomiting, she would pass out. Pt feeling better after meclizine, is ambulatory in teh ED. Doubt central process. Will have pt continue eval by PCP. 15:43 04/08/2010 by Annye Rusk - MD, Dr. Milinda Pointer electronically signed by  Responsible Physician 15:43 04/08/2010 by Annye Rusk - MD, Dr. Ermalinda Memos

## 2010-06-11 NOTE — Assessment & Plan Note (Signed)
Summary: bp elevated/njr   Vital Signs:  Patient profile:   56 year old female Menstrual status:  postmenopausal Weight:      246 pounds Temp:     98.2 degrees F oral Pulse rate:   88 / minute BP sitting:   120 / 80  (right arm) Cuff size:   large  Vitals Entered By: Romualdo Bolk, CMA (AAMA) (June 11, 2009 1:21 PM)  Serial Vital Signs/Assessments:  Time      Position  BP       Pulse  Resp  Temp     By                     138/72                         Madelin Headings MD  Comments: large cuff  right arm sitting By: Madelin Headings MD   CC: BP was 159/79 on 1/28. Lips tingling, face tingling and twitch. Eyes feel like they are underwater this am and pt is also having fatigue.  This has been going on and off for about 1 week except for face tingle started today. Pt is hacving some bilateral shoulder pain and numbness on the pinky side of hands , Hypertension Management   History of Present Illness: [name] comesin today for   for SDA .    Since last visit  here  there have been no major changes in health status  . Seen in Ed last fall for allergic rx to omnicef.  Fell 4 weeks at school and hurt    shoulders and knees   then numb   to pinkiy fingers  . comes and goes.  No weakness.  ? worse when driving or sitting.   Had gone to [facility].   Refered to PT.   But   numbness starteda fter last visit.  Face twitching  today   .    Worried about her symptom . hx of opthalmic  migraine.     also.    this seems different. Lots of situaltional stresses in the family but the above didnt ocurr until recently after fall.    Hypertension History:      She complains of palpitations, dyspnea with exertion, visual symptoms, neurologic problems, and syncope, but denies headache, chest pain, orthopnea, PND, peripheral edema, and side effects from treatment.  She notes no problems with any antihypertensive medication side effects.        Positive major cardiovascular risk factors include  female age 54 years old or older and hypertension.  Negative major cardiovascular risk factors include no history of diabetes and non-tobacco-user status.        Further assessment for target organ damage reveals no history of ASHD, cardiac end-organ damage (CHF/LVH), or stroke/TIA.     Preventive Screening-Counseling & Management  Alcohol-Tobacco     Alcohol drinks/day: 0     Smoking Status: never  Caffeine-Diet-Exercise     Caffeine use/day: <1     Does Patient Exercise: no  Current Medications (verified): 1)  Calcium 500 Mg Tabs (Calcium) .... Take Daily 2)  Synthroid 112 Mcg Tabs (Levothyroxine Sodium) .... Take 1 Tablet By Mouth Once A Day. 3)  Multiple Vitamin   Tabs (Multiple Vitamin) .... Daily 4)  Diovan Hct 320-25 Mg  Tabs (Valsartan-Hydrochlorothiazide) .Marland Kitchen.. 1 By Mouth Qd 5)  Norvasc 5 Mg  Tabs (Amlodipine Besylate) .Marland Kitchen.. 1 By  Mouth Once Daily 6)  Flonase 50 Mcg/act  Susp (Fluticasone Propionate) .... 2 Sprays [place] Nares Q D  Allergies (verified): 1)  ! Penicillin 2)  ! Cefdinir (Cefdinir)  Past History:  Past medical, surgical, family and social histories (including risk factors) reviewed, and no changes noted (except as noted below).  Past Medical History: Depression GERD Hypothyroidism Headache  hx of migraines when younger  Fibromyalgia Hypertension ABnormal lfts presumes nafil  per [facility] g8p6 Hx palpitations with normal holter event monitoring.    Past Surgical History: Reviewed history from 11/11/2006 and no changes required. 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 Family History Diabetes 1st degree relative  daughter with type 1 and2  Family History High cholesterol Family History Hypertension both parents Family History  Osteoporosis ADHD   Social History: Reviewed history from 11/10/2008 and no changes required.   Teachers aide ec Divorced remarried  tsince last visit  Never Smoked special educ   [employer].   HH of 4   3 outside dogs and 2 goats and 30 ckickens and quail. [place]     Review of Systems  The patient denies anorexia, fever, weight loss, weight gain, decreased hearing, abdominal pain, melena, transient blindness, difficulty walking, abnormal bleeding, enlarged lymph nodes, and angioedema.         some gerd with caffiene  Physical Exam  General:  Well-developed,well-nourished,in no acute distress; alert,appropriate and cooperative throughout examination Head:  normocephalic and atraumatic.   face symmetric Eyes:  vision grossly intact.  eoms nl   Ears:  R ear normal and L ear normal.   Mouth:  pharynx pink and moist.   Neck:  No deformities, masses,   pain with flexion and lateral movement left.    Lungs:  Normal respiratory effort, chest expands symmetrically. Lungs are clear to auscultation, no crackles or wheezes. Heart:  Normal rate and regular rhythm. S1 and S2 normal without gallop, murmur, click, rub or other extra sounds. Pulses:  pulses intact without delay   Extremities:  no clubbing cyanosis or edema  Neurologic:  alert & oriented X3, gait normal, and DTRs symmetrical and normal.  grip ok  cn seem ok    intrinsic muscle strenth gossly nl  rom neck with pain left shoulder back area   gait ok  Skin:  turgor normal, color normal, no ecchymoses, and no petechiae.     Cervical Nodes:  No lymphadenopathy noted Psych:  Oriented X3, normally interactive, good eye contact, not anxious appearing, and not depressed appearing.     Impression & Recommendations:  Problem # 1:  CERVICAL STRAIN, ACUTE (ICD-847.0)   acts like a wiplash injury both in mechanism and  pain    Problem # 2:  NUMBNESS, HAND (ICD-782.0) pinky fingers   ? if   positional but not  seeminly from  elbow   ? neck related new since onset of injury  no weakness today last labs  in May June 2010 .   Problem # 3:  TWITCHING (ICD-781.0) h x of face  lots of stress but seems to ocurr after fall.    not on exam today.   other risk factors .  Problem # 4:  HYPERTENSION (ICD-401.9) ok today  Her updated medication list for this problem includes:  Diovan Hct 320-25 Mg Tabs (Valsartan-hydrochlorothiazide) .Marland Kitchen... 1 by mouth qd    Norvasc 5 Mg Tabs (Amlodipine besylate) .Marland Kitchen... 1 by mouth once daily  BP today: 120/80 Prior BP: 100/70 (03/28/2009)  Prior 10 Yr Risk Heart Disease: Not enough information (05/03/2007)  Labs Reviewed: K+: 4.0 (11/06/2008) Creat: : 0.7 (11/06/2008)   Chol: 222 (11/06/2008)   HDL: 47.10 (11/06/2008)   LDL: DEL (07/09/2006)   TG: 120.0 (11/06/2008)  Complete Medication List: 1)  Calcium 500 Mg Tabs (Calcium) .... Take daily 2)  Synthroid 112 Mcg Tabs (Levothyroxine sodium) .... Take 1 tablet by mouth once a day. 3)  Multiple Vitamin Tabs (Multiple vitamin) .... Daily 4)  Diovan Hct 320-25 Mg Tabs (Valsartan-hydrochlorothiazide) .Marland Kitchen.. 1 by mouth qd 5)  Norvasc 5 Mg Tabs (Amlodipine besylate) .Marland Kitchen.. 1 by mouth once daily 6)  Flonase 50 Mcg/act Susp (Fluticasone propionate) .... 2 sprays eaach nares q d  Hypertension Assessment/Plan:      The patient's hypertensive risk group is category B: At least one risk factor (excluding diabetes) with no target organ damage.  Today's blood pressure is 120/80.  Her blood pressure goal is < 140/90.  Patient Instructions: 1)  REC   further evaluation for your neck  and numbness   into your fingers   face  .Marland Kitchen  Rec  spine / ortho or neurology consult.  2)  Your blood pressure is ok today.     3)  Call your workers comp person about how to go about getting more evaluation. Call us about what to do.

## 2010-06-11 NOTE — Progress Notes (Signed)
Summary: Still having lightheadedness  Phone Note Call from Patient Call back at [phone]   Caller: Patient Summary of Call: Pt states that she is still having lightheadedness but no dizziness. Pt's bp 154/84 for the last 2 am's then today it was 157/81. Pt states that her ears are still clogged and is under alot of stress. Initial call taken by: Romualdo Bolk, CMA Duncan Dull),  March 26, 2010 8:31 AM  Follow-up for Phone Call        need to make sure Blood pressure coming down to normal. over the next week.   If so  and continues can have ent to see,   If bp not controlled then   we may have to increase her meds.  Follow-up by: Madelin Headings MD,  March 26, 2010 4:05 PM  Additional Follow-up for Phone Call Additional follow up Details #1::        Notified pt. She would like to make her own ENT appt. Will monitor BP and call us. Additional Follow-up by: Lynann Beaver CMA AAMA,  March 26, 2010 4:34 PM

## 2010-06-11 NOTE — Consult Note (Signed)
Summary: [facility]  [facility]   Imported By: Maryln Gottron 04/17/2010 10:14:49  _____________________________________________________________________  External Attachment:    Type:   Image     Comment:   External Document

## 2010-06-11 NOTE — Assessment & Plan Note (Signed)
Summary: extreme dizzy spells/bp higher than usual/cjr   Vital Signs:  Patient profile:   55 year old female Menstrual status:  postmenopausal Weight:      242 pounds Pulse rate:   88 / minute Pulse (ortho):   125 / minute BP sitting:   150 / 80  (right arm) BP standing:   110 / 70 Cuff size:   large  Vitals Entered By: Romualdo Bolk, CMA (AAMA) (March 15, 2010 10:46 AM) CC: Dizziness, worse this am. Pt states that when she lays down on rt side it gets worse. She states that she fell down this am. Pt states that she is now on synthroid gen x 1 week.   Serial Vital Signs/Assessments:  Time      Position  BP       Pulse  Resp  Temp     By 10:51 AM  Lying RA  110/64   88                    Shannon S Cranford, CMA (AAMA) 10:51 AM  Sitting   140/76   95                    Shannon S Cranford, CMA (AAMA) 10:51 AM  Standing  110/70   125                   Shannon S Cranford, CMA (AAMA)  Comments: 10:51 AM Pt was dizzy when going from laying down to setting. She was also a little dizzy when she was standing.  By: Romualdo Bolk, CMA (AAMA)    History of Present Illness: [name] comes in today with her husband for an acute visit. She was in her usual state of health when getting up this morning turning to the right and bad I'm trying to get up and had acute onset of dizziness spinning. She did not fall have loss of consciousness vision changes fever or pain. She was unable to drive and her husband drove her here. She did take her blood pressure medications this morning but not have much fluid. She states she felt normal yesterday although her allergies may have been bothering her a bit. she's taken no new medication although her husband had her take her Flonase this morning.   No significant change in her health since her last visit see notes and lab studies .  no history of current problem hearing is good but feels a little off with fluid feeling in her ear over the  last months.   She does have a history of tendency for motion sickness under certain extremes.  Preventive Screening-Counseling & Management  Alcohol-Tobacco     Alcohol drinks/day: 0     Smoking Status: never  Caffeine-Diet-Exercise     Caffeine use/day: <1     Does Patient Exercise: no  Current Medications (verified): 1)  Calcium 500 Mg Tabs (Calcium) .... Take Daily 2)  Synthroid 112 Mcg Tabs (Levothyroxine Sodium) .... Take 1 Tablet By Mouth Once A Day. 3)  Multiple Vitamin   Tabs (Multiple Vitamin) .... Daily 4)  Diovan Hct 320-25 Mg  Tabs (Valsartan-Hydrochlorothiazide) .Marland Kitchen.. 1 By Mouth Qd 5)  Norvasc 5 Mg  Tabs (Amlodipine Besylate) .Marland Kitchen.. 1 By Mouth Once Daily 6)  Flonase 50 Mcg/act  Susp (Fluticasone Propionate) .... 2 Sprays [place]ach Nares Q D  Allergies (verified): 1)  ! Penicillin 2)  ! Cefdinir (Cefdinir)  Past History:  Care Management: Gynecology: [facility] Dermatology: Elberta Leatherwood: [facility] in the past Cards:  Occupational Health: [employer]- [employer]  Review of Systems  The patient denies anorexia, fever, weight loss, weight gain, vision loss, syncope, prolonged cough, headaches, abdominal pain, transient blindness, abnormal bleeding, enlarged lymph nodes, and angioedema.    Physical Exam  General:  alert, well-developed, well-nourished, and well-hydrated.  laying down and no acute distress Head:  normocephalic and atraumatic.   Eyes:  PERRL, EOMs full, conjunctiva clear  Ears:  R ear normal, L ear normal, and no external deformities.   Nose:  no external deformity, no external erythema, and no nasal discharge.  mild congestiontenderness Mouth:  pharynx pink and moist.   Neck:  No deformities, masses, or tenderness noted. Lungs:  Normal respiratory effort, chest expands symmetrically. Lungs are clear to auscultation, no crackles or wheezes. Heart:  Normal rate and regular rhythm. S1 and S2 normal without gallop, murmur, click, rub or other extra sounds.no lifts.     Abdomen:  Bowel sounds positive,abdomen soft and non-tender without masses, organomegaly or  noted. Pulses:  pulses intact without delay   Extremities:  no clubbing cyanosis or edema  Neurologic:  alert & oriented X3, strength normal in all extremities, gait normal, DTRs symmetrical and normal, finger-to-nose normal, and Romberg negative.   prefers to walk and move with had in one position gets dizzy if she turns to the right   Skin:  turgor normal, color normal, no ecchymoses, and no petechiae.   Cervical Nodes:  No lymphadenopathy noted Psych:  Oriented X3, normally interactive, good eye contact, not anxious appearing, and not depressed appearing.     Impression & Recommendations:  Problem # 1:  VERTIGO, POSITIONAL (ICD-386.11) him acute onset with  benign context no alarm features on physical exam.  She does have some mild orthostatic blood pressure drop but she is a bit dehydrated today.  Problem # 2:  ALLERGIC RHINITIS (ICD-477.9) possibly influencing number one. Recent exposure to some old. Will restart her medications. Her updated medication list for this problem includes:    Flonase 50 Mcg/act Susp (Fluticasone propionate) .Marland Kitchen... 2 sprays eaach nares q d  Problem # 3:  HYPERTENSION (ICD-401.9)  no change in medicine that this time she's recently have blood work done Her updated medication list for this problem includes:    Diovan Hct 320-25 Mg Tabs (Valsartan-hydrochlorothiazide) .Marland Kitchen... 1 by mouth qd    Norvasc 5 Mg Tabs (Amlodipine besylate) .Marland Kitchen... 1 by mouth once daily  BP today: 150/80 Prior BP: 126/80 (02/18/2010)  Prior 10 Yr Risk Heart Disease: Not enough information (05/03/2007)  Labs Reviewed: K+: 3.9 (02/18/2010) Creat: : 1.0 (02/18/2010)   Chol: 227 (02/18/2010)   HDL: 53.90 (02/18/2010)   LDL: DEL (07/09/2006)   TG: 162.0 (02/18/2010)  Complete Medication List: 1)  Calcium 500 Mg Tabs (Calcium) .... Take daily 2)  Synthroid 112 Mcg Tabs (Levothyroxine sodium)  .... Take 1 tablet by mouth once a day. 3)  Multiple Vitamin Tabs (Multiple vitamin) .... Daily 4)  Diovan Hct 320-25 Mg Tabs (Valsartan-hydrochlorothiazide) .Marland Kitchen.. 1 by mouth qd 5)  Norvasc 5 Mg Tabs (Amlodipine besylate) .Marland Kitchen.. 1 by mouth once daily 6)  Flonase 50 Mcg/act Susp (Fluticasone propionate) .... 2 sprays eaach nares q d  Other Orders: Hgb (85018) Glucose, (CBG) ([address]) Fingerstick (60454)  Patient Instructions: 1)  I think you have positional vertigo  2)  Poss aggravated   by   allergy.  3)  take  flonase every day .  4)  also take   claritin allegra or zyrtec   for possible allergy.  cause. 5)  Can add meclizine  ( antivert 0 sea sickness med alos but can make you drowsy and doesnt make you better quicker.  6)  Increase fluids today. 7)  expect improvement within the week.  call if not getting better  .    Orders Added: 1)  Hgb [85018] 2)  Glucose, (CBG) [82962] 3)  Fingerstick [36416] 4)  Est. Patient Level IV [04540]    Laboratory Results   Blood Tests     Glucose (random): 137 mg/dL   (Normal Range: 98-119)   CBC   HGB:  13.4 g/dL   (Normal Range: 14.7-82.9 in Males, 12.0-15.0 in Females) Comments: Rita Ohara  March 15, 2010 11:27 AM

## 2010-06-11 NOTE — Letter (Signed)
Summary: Generic Letter  [place] [facility]  [address] [place], [place] [address]   Phone: [phone]  Fax: [phone]    02/18/2010  [name] [address] [place], [place]  [address] DOB March 30, 1956  To whom it may concern:    The above person is a patient in our practice . She should be  permitted to wear her  athletic shoes   for  medical reasons.            Sincerely,   Berniece Andreas MD

## 2010-06-11 NOTE — Progress Notes (Signed)
Summary: [employer] is wanting to do PT for face  Phone Note Call from Patient Call back at [facility] Phone [phone]   Caller: Patient Summary of Call: Pt seen Occupation [employer] and they toldher that the fall would have caused her problems from the neck down and it isn't causing problems with her face. They are recommending PT for her face. She is wanting to know if you think this is a good idea or if it's just stress? Initial call taken by: Romualdo Bolk, CMA Duncan Dull),  June 11, 2009 4:48 PM  Follow-up for Phone Call        Per Dr. Fabian Sharp- Okay to do Pt but if numbness gets worse needs to d/c PT. Follow-up by: Romualdo Bolk, CMA Duncan Dull),  June 12, 2009 5:16 PM  Additional Follow-up for Phone Call Additional follow up Details #1::        Pt aware.  Additional Follow-up by: Romualdo Bolk, CMA (AAMA),  June 13, 2009 1:30 PM

## 2010-06-11 NOTE — Assessment & Plan Note (Signed)
Summary: follow up on bp/ssc   Vital Signs:  Patient profile:   55 year old female Menstrual status:  postmenopausal Height:      65.5 inches Weight:      242 pounds BMI:     39.80 Pulse rate:   100 / minute BP sitting:   126 / 80  (right arm) Cuff size:   large  Vitals Entered By: Romualdo Bolk, CMA (AAMA) (February 18, 2010 1:03 PM) CC: Follow-up visit on blood pressure, Hypertension Management   History of Present Illness: [name] comes in today  for  follow up of multiple medical problems . Since last visit Jan 2011  her  health has been stable and taking meds without difficulty . BP:   doing    ok.   Thyroid no change  Job stress:  the most problematic but no new signs. No change in vision no cp sob except her previous doe that she says is from weight and deconditioning Mood stable   Plantar fasciitis   :  controlled  at times with sports shoes at work . Needs  a note for work to wear sports shoes. RHnitis : stable   Hypertension History:      She complains of dyspnea with exertion, but denies headache, chest pain, palpitations, orthopnea, PND, peripheral edema, visual symptoms, neurologic problems, syncope, and side effects from treatment.  She notes no problems with any antihypertensive medication side effects.  SOB when exercising.        Positive major cardiovascular risk factors include female age 11 years old or older and hypertension.  Negative major cardiovascular risk factors include no history of diabetes and non-tobacco-user status.        Further assessment for target organ damage reveals no history of ASHD, cardiac end-organ damage (CHF/LVH), or stroke/TIA.     Preventive Screening-Counseling & Management  Alcohol-Tobacco     Alcohol drinks/day: 0     Smoking Status: never  Caffeine-Diet-Exercise     Caffeine use/day: <1     Does Patient Exercise: no  Hep-HIV-STD-Contraception     Dental Visit-last 6 months yes     Sun Exposure-Excessive:  no  Safety-Violence-Falls     Seat Belt Use: yes     Smoke Detectors: yes  Current Medications (verified): 1)  Calcium 500 Mg Tabs (Calcium) .... Take Daily 2)  Synthroid 112 Mcg Tabs (Levothyroxine Sodium) .... Take 1 Tablet By Mouth Once A Day. 3)  Multiple Vitamin   Tabs (Multiple Vitamin) .... Daily 4)  Diovan Hct 320-25 Mg  Tabs (Valsartan-Hydrochlorothiazide) .Marland Kitchen.. 1 By Mouth Qd 5)  Norvasc 5 Mg  Tabs (Amlodipine Besylate) .Marland Kitchen.. 1 By Mouth Once Daily 6)  Flonase 50 Mcg/act  Susp (Fluticasone Propionate) .... 2 Sprays Eaach Nares Q D  Allergies (verified): 1)  ! Penicillin 2)  ! Cefdinir (Cefdinir)  Past History:  Past medical, surgical, family and social histories (including risk factors) reviewed, and no changes noted (except as noted below).  Past Medical History: Reviewed history from 06/11/2009 and no changes required. Depression GERD Hypothyroidism Headache  hx of migraines when younger  Fibromyalgia Hypertension ABnormal lfts presumes nafil  per [facility] g8p6 Hx palpitations with normal holter event monitoring.    Past Surgical History: Tonsillectomy D & C Ectopic Pregnancy  Past History:  Care Management: Gynecology: [facility] Dermatology: Elberta Leatherwood: [facility] in the past Cards:  Occupational Health: [employer]- Work[employer]'s [employer]  Family History: Reviewed history from 11/10/2008 and no changes required. Family History Diabetes  1st degree relative  daughter with type 1 and2  Family History High cholesterol Family History Hypertension both parents Family History Osteoporosis ADHD   Social History: Reviewed history from 11/10/2008 and no changes required.   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 [place]  Job  stresses.  ex husband died this year     Review of Systems  The patient denies anorexia, fever, weight loss,  weight gain, vision loss, decreased hearing, chest pain, syncope, peripheral edema, prolonged cough, abdominal pain, melena, hematochezia, severe indigestion/heartburn, hematuria, transient blindness, difficulty walking, depression, unusual weight change, abnormal bleeding, and enlarged lymph nodes.    Physical Exam  General:  Well-developed,well-nourished,in no acute distress; alert,appropriate and cooperative throughout examination Head:  normocephalic and atraumatic.   Eyes:  vision grossly intact, pupils equal, and pupils round.   Ears:  R ear normal, L ear normal, and no external deformities.   Nose:  no external deformity and no external erythema.   Mouth:  pharynx pink and moist.   Neck:  No deformities, masses, or tenderness noted. Lungs:  Normal respiratory effort, chest expands symmetrically. Lungs are clear to auscultation, no crackles or wheezes. Heart:  Normal rate and regular rhythm. S1 and S2 normal without gallop, murmur, click, rub or other extra sounds.no lifts.   Abdomen:  Bowel sounds positive,abdomen soft and non-tender without masses, organomegaly or hernias noted. Pulses:  pulses intact without delay   Extremities:  no clubbing cyanosis or edema  Neurologic:  alert & oriented X3, strength normal in all extremities, and gait normal.   Skin:  turgor normal, color normal, no suspicious lesions, no ecchymoses, and no petechiae.   Cervical Nodes:  No lymphadenopathy noted Psych:  Oriented X3, normally interactive, good eye contact, not anxious appearing, and not depressed appearing.     Impression & Recommendations:  Problem # 1:  HYPERTENSION (ICD-401.9)  Her updated medication list for this problem includes:    Diovan Hct 320-25 Mg Tabs (Valsartan-hydrochlorothiazide) .Marland Kitchen... 1 by mouth qd    Norvasc 5 Mg Tabs (Amlodipine besylate) .Marland Kitchen... 1 by mouth once daily  Orders: TLB-BMP (Basic Metabolic Panel-BMET) (80048-METABOL)  BP today: 126/80 Prior BP: 120/80  (06/11/2009)  Prior 10 Yr Risk Heart Disease: Not enough information (05/03/2007)  Labs Reviewed: K+: 4.0 (11/06/2008) Creat: : 0.7 (11/06/2008)   Chol: 222 (11/06/2008)   HDL: 47.10 (11/06/2008)   LDL: DEL (07/09/2006)   TG: 120.0 (11/06/2008)  Problem # 2:  HYPOTHYROIDISM (ICD-244.9) due for labs  Her updated medication list for this problem includes:    Synthroid 112 Mcg Tabs (Levothyroxine sodium) .Marland Kitchen... Take 1 tablet by mouth once a day.  Orders: TLB-TSH (Thyroid Stimulating Hormone) (84443-TSH) Specimen Handling (02725) Venipuncture (36644)  Problem # 3:  VITAMIN D DEFICIENCY (ICD-268.9)  Orders: TLB-BMP (Basic Metabolic Panel-BMET) (80048-METABOL) T-Vitamin D (25-Hydroxy) (03474-25956) Specimen Handling (38756) Venipuncture (43329)  Problem # 4:  ALLERGIC RHINITIS (ICD-477.9)  Her updated medication list for this problem includes:    Flonase 50 Mcg/act Susp (Fluticasone propionate) .Marland Kitchen... 2 sprays eaach nares q d  Problem # 5:  PLANTAR FASCIITIS (ICD-728.71) continue  exercise inserts good heel support. rec weight loss   Problem # 6:  OBESITY (ICD-278.00)  Ht: 65.5 (02/18/2010)   Wt: 242 (02/18/2010)   BMI: 39.80 (02/18/2010)  Complete Medication List: 1)  Calcium 500 Mg Tabs (Calcium) .... Take daily 2)  Synthroid 112 Mcg Tabs (Levothyroxine sodium) .... Take 1 tablet by mouth once a day. 3)  Multiple Vitamin Tabs (Multiple vitamin) .... Daily 4)  Diovan Hct 320-25 Mg Tabs (Valsartan-hydrochlorothiazide) .Marland Kitchen.. 1 by mouth qd 5)  Norvasc 5 Mg Tabs (Amlodipine besylate) .Marland Kitchen.. 1 by mouth once daily 6)  Flonase 50 Mcg/act Susp (Fluticasone propionate) .... 2 sprays eaach nares q d  Other Orders: Admin 1st Vaccine (44010) Flu Vaccine 22yrs + (27253) TLB-CBC Platelet - w/Differential (85025-CBCD) TLB-Hepatic/Liver Function Pnl (80076-HEPATIC) TLB-Lipid Panel (80061-LIPID)  Hypertension Assessment/Plan:      The patient's hypertensive risk group is category B: At  least one risk factor (excluding diabetes) with no target organ damage.  Today's blood pressure is 126/80.  Her blood pressure goal is < 140/90.  Patient Instructions: 1)  You will be informed of lab results when available.  2)  continue medications. 3)  then plan follow up  Prescriptions: FLONASE 50 MCG/ACT  SUSP (FLUTICASONE PROPIONATE) 2 sprays eaach nares q d  #16 Gram x 11   Entered by:   Romualdo Bolk, CMA (AAMA)   Authorized by:   Madelin Headings MD   Signed by:   Romualdo Bolk, CMA (AAMA) on 02/18/2010   Method used:   Electronically to        [id number] (retail)       [address]       [place], [place]  [address]       Ph: [phone] or [phone]       Fax: [phone]   RxID:   [id number] NORVASC 5 MG  TABS (AMLODIPINE BESYLATE) 1 by mouth once daily  #30 Tablet x 11   Entered by:   Romualdo Bolk, CMA (AAMA)   Authorized by:   Madelin Headings MD   Signed by:   Romualdo Bolk, CMA (AAMA) on 02/18/2010   Method used:   Electronically to        [id number] (retail)       [address]       [place], [place]  [address]       Ph: [phone] or [phone]       Fax: [phone]   RxID:   [id number] DIOVAN HCT 320-25 MG  TABS (VALSARTAN-HYDROCHLOROTHIAZIDE) 1 by mouth qd  #30 Tablet x 11   Entered by:   Romualdo Bolk, CMA (AAMA)   Authorized by:   Madelin Headings MD   Signed by:   Romualdo Bolk, CMA (AAMA) on 02/18/2010   Method used:   Electronically to        [id number] (retail)       [address]       [place], [place]  [address]       Ph: [phone] or [phone]       Fax: [phone]   RxID:   [id number] SYNTHROID 112 MCG TABS (LEVOTHYROXINE SODIUM) Take 1 tablet by mouth once a day.  #30 Tablet x 11   Entered by:   Romualdo Bolk, CMA (AAMA)   Authorized by:   Madelin Headings MD   Signed by:   Romualdo Bolk, CMA (AAMA) on 02/18/2010   Method used:   Electronically to        [id number] (retail)       [address]  [place], [place]  [address]       Ph: [phone] or [phone]       Fax: [phone]   RxID:   [id number]    Flu Vaccine Consent Questions     Do you have a history of severe allergic reactions to this vaccine? no    Any prior history of allergic reactions to egg and/or gelatin? no    Do you have a sensitivity to the preservative Thimersol? no    Do you have a past history of Guillan-Barre Syndrome? no    Do you currently have an acute febrile illness? no    Have you ever had a severe reaction to latex? no    Vaccine information given and explained to patient? yes    Are you currently pregnant? no    Lot Number:[id number]   Exp Date:11/09/2010   Site Given  Left Deltoid IMu Romualdo Bolk, CMA (AAMA)  February 18, 2010 1:09 PM

## 2010-06-11 NOTE — Progress Notes (Signed)
Summary: fyi  Phone Note Call from Patient Call back at Home Phone [phone]   Caller: Patient Summary of Call: Pt just wanted to let us know that the dizziness has gone but still feels lightheaded in am. She will call us if she gets worse. Initial call taken by: Romualdo Bolk, CMA (AAMA),  March 18, 2010 4:50 PM

## 2010-06-11 NOTE — Progress Notes (Signed)
Summary: At [facility]  Phone Note Call from Patient Call back at [facility] Phone [phone]   Caller: Patient Summary of Call: Pt is now at [facility] BP is 146/87. She almost passed out at school. Pt was told to go to ENT. I told pt that she could call [facility] but if she has any problems to call us and let us know. Initial call taken by: Romualdo Bolk, CMA Duncan Dull),  April 08, 2010 4:20 PM  Follow-up for Phone Call        noted

## 2010-06-11 NOTE — Letter (Signed)
Summary: Out of Work  Adult nurse at [employer]  [address]   [place], [place] [address]   Phone: [phone]  Fax: [phone]    March 15, 2010   Employee:  [name] Nobles    To Whom It May Concern:   For Medical reasons, please excuse the above named employee from work for the following dates:  Start:   November 4th   End:   November  7th if better   If you need additional information, please feel free to contact our office.         Sincerely,    Madelin Headings MD

## 2010-06-13 NOTE — Progress Notes (Signed)
Summary: refill brand name only******  Phone Note Refill Request Call back at Home Phone [phone] Message from:  Patient-----live call  Refills Requested: Medication #1:  SYNTHROID 112 MCG TABS Take 1 tablet by mouth once a day.   Brand Name Necessary? Yes [facility] -------wants brand name only.  Initial call taken by: Warnell Forester,  May 24, 2010 4:31 PM  Follow-up for Phone Call        Left message on pt's voicemail saying that pt should have enough refills. But if the pharmacy will not give her brand without [place] saying that it's okay, then they need to give [place] a call about this. Follow-up by: Romualdo Bolk, CMA (AAMA),  May 24, 2010 5:02 PM     Appended Document: refill brand name only****** see Last ov   should be on brand medication of synthroid.  Had side effect possibel when changed to generic.  Appended Document: refill brand name only****** Left message on machine about this.

## 2010-06-13 NOTE — Assessment & Plan Note (Signed)
Summary: elevated bp//ccm   Vital Signs:  Patient profile:   55 year old female Menstrual status:  postmenopausal Weight:      245 pounds Pulse rate:   78 / minute BP sitting:   140 / 80  (left arm) Cuff size:   large  Vitals Entered By: Romualdo Bolk, CMA (AAMA) (May 08, 2010 2:38 PM)  Serial Vital Signs/Assessments:  Time      Position  BP       Pulse  Resp  Temp     By                     161/88                         Romualdo Bolk, CMA (AAMA) 2:41 PM   R Arm     139/82                         Madelin Headings MD           R Arm     140/80                         Madelin Headings MD  Comments: Pt's machine By: Romualdo Bolk, CMA (AAMA)  2:41 PM large cuff sitting  By: Madelin Headings MD  personal machine sitting right  By: Madelin Headings MD   CC: BP was 180/74 last night, light headed and dizzy., Hypertension Management   History of Present Illness: [name]  comes in today  for acute visit for problem swith BP readings.  Had been feeling badly with dizziness and clammy ( different than the vertifo that resolved)  and began noting her BP readings that were up when at work   and down to 120 range when relaxed and well ocass 140 . recently had a reading of 180 but it came down in the next hour to 130-140 range.    Worried about having a stroke as this runs in the family .   No chang  e in BP meds but synthroid has been changed to generic  just before this problem started.  NO heart racing but some flip fopping and no cp sob that is new.  Had a spell at work and had ed evaluation that was unrevealing ? dx vertifo.  has seen ent in the recent past and was  nl exam ( vertigo had subsided)  Hypertension History:      She complains of palpitations, dyspnea with exertion, and syncope, but denies headache, chest pain, orthopnea, PND, peripheral edema, visual symptoms, neurologic problems, and side effects from treatment.  She notes no problems with any  antihypertensive medication side effects.        Positive major cardiovascular risk factors include female age 28 years old or older and hypertension.  Negative major cardiovascular risk factors include no history of diabetes and non-tobacco-user status.        Further assessment for target organ damage reveals no history of ASHD, cardiac end-organ damage (CHF/LVH), or stroke/TIA.     Preventive Screening-Counseling & Management  Alcohol-Tobacco     Alcohol drinks/day: 0     Smoking Status: never  Caffeine-Diet-Exercise     Caffeine use/day: <1     Does Patient Exercise: no  Current Medications (verified): 1)  Calcium 500 Mg Tabs (Calcium) .Marland KitchenMarland KitchenMarland Kitchen  Take Daily 2)  Synthroid 112 Mcg Tabs (Levothyroxine Sodium) .... Take 1 Tablet By Mouth Once A Day. 3)  Multiple Vitamin   Tabs (Multiple Vitamin) .... Daily 4)  Diovan Hct 320-25 Mg  Tabs (Valsartan-Hydrochlorothiazide) .Marland Kitchen.. 1 By Mouth Qd 5)  Norvasc 5 Mg  Tabs (Amlodipine Besylate) .Marland Kitchen.. 1 By Mouth Once Daily 6)  Flonase 50 Mcg/act  Susp (Fluticasone Propionate) .... 2 Sprays [place] Nares Q D  Allergies (verified): 1)  ! Penicillin 2)  ! Cefdinir (Cefdinir)  Past History:  Past medical, surgical, family and social histories (including risk factors) reviewed, and no changes noted (except as noted below).  Past Medical History: Reviewed history from 06/11/2009 and no changes required. Depression GERD Hypothyroidism Headache  hx of migraines when younger  Fibromyalgia Hypertension ABnormal lfts presumes nafil  per [facility] g8p6 Hx palpitations with normal holter event monitoring.    Past Surgical History: Reviewed history from 02/18/2010 and no changes required. Tonsillectomy D & C Ectopic Pregnancy  Past History:  Care Management: Gynecology: [facility] Dermatology: Elberta Leatherwood: [facility] in the past Cards:  Occupational Health: [employer]- [employer] ENT:   Family History: Reviewed history from 11/10/2008 and no changes  required. Family History Diabetes 1st degree relative  daughter with type 1 and2  Family History High cholesterol Family History Hypertension both parents Family History Osteoporosis ADHD   Social History: Reviewed history from 02/18/2010 and no changes required.   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 [place]  Job  stresses.  ex husband died this year     Review of Systems  The patient denies anorexia, fever, weight loss, weight gain, vision loss, decreased hearing, hoarseness, chest pain, syncope, dyspnea on exertion, prolonged cough, melena, hematochezia, severe indigestion/heartburn, muscle weakness, transient blindness, difficulty walking, unusual weight change, abnormal bleeding, enlarged lymph nodes, and angioedema.         see hpi  Physical Exam  General:  Well-developed,well-nourished,in no acute distress; alert,appropriate and cooperative throughout examination Head:  normocephalic and atraumatic.   Neck:  No deformities, masses, or tenderness noted. Lungs:  Normal respiratory effort, chest expands symmetrically. Lungs are clear to auscultation, no crackles or wheezes. Heart:  Normal rate and regular rhythm. S1 and S2 normal without gallop, murmur, click, rub or other extra sounds.no lifts.   Pulses:  nl cap arefill and pulses  Extremities:  no clubbing cyanosis or edema  Neurologic:  alert & oriented X3, strength normal in all extremities, and gait normal.  non focal  Skin:  turgor normal, color normal, no ecchymoses, and no petechiae.   Cervical Nodes:  No lymphadenopathy noted Psych:  Oriented X3, normally interactive, good eye contact, and not depressed appearing.  midly anxious reviewed her BP pulse log with her   Impression & Recommendations:  Problem # 1:  HYPERTENSION, LABILE (ICD-401.9) recent onset  of instable bp  readings  . Most readings are pretty good  but spikes of systolic quite high seem  to be related to work and stress ther but at risk and  have also change syntroid med to generic which could affect her situation .  No drug interactions obvious and not taking grapefruit juice.  Consider echo /dopplers eval for secondary ht  other eval as appropriate.  Her updated medication list for this  problem includes:    Diovan Hct 320-25 Mg Tabs (Valsartan-hydrochlorothiazide) .Marland Kitchen... 1 by mouth qd    Norvasc 5 Mg Tabs (Amlodipine besylate) .Marland Kitchen... 1 by mouth once daily  Problem # 2:  HYPOTHYROIDISM (ICD-244.9) cahgne back to brand and  sample of 28 given  for now. Her updated medication list for this problem includes:    Synthroid 112 Mcg Tabs (Levothyroxine sodium) .Marland Kitchen... Take 1 tablet by mouth once a day.  Problem # 3:  ANXIETY, SITUATIONAL (ICD-308.3) ? if adding to above    and augmenting the situation.  will follow closely    add med as needed as a trial and consider b blocker in future if needed  Complete Medication List: 1)  Calcium 500 Mg Tabs (Calcium) .... Take daily 2)  Synthroid 112 Mcg Tabs (Levothyroxine sodium) .... Take 1 tablet by mouth once a day. 3)  Multiple Vitamin Tabs (Multiple vitamin) .... Daily 4)  Diovan Hct 320-25 Mg Tabs (Valsartan-hydrochlorothiazide) .Marland Kitchen.. 1 by mouth qd 5)  Norvasc 5 Mg Tabs (Amlodipine besylate) .Marland Kitchen.. 1 by mouth once daily 6)  Flonase 50 Mcg/act Susp (Fluticasone propionate) .... 2 sprays eaach nares q d 7)  Alprazolam 0.25 Mg Tabs (Alprazolam) .Marland Kitchen.. 1-2 by mouth three times a day as needed anxiety  Hypertension Assessment/Plan:      The patient's hypertensive risk group is category B: At least one risk factor (excluding diabetes) with no target organ damage.  Today's blood pressure is 140/80.  Her blood pressure goal is < 140/90.  Patient Instructions: 1)  change back to branded  synthroid. 2)  continue to monitor BP  but  need to sit and relax for 5 mionutes  for reading to be accurate. 3)  can try  as needed xanax incase anxiety is adding to the elevation of your BP.  4)  return office visit in 1 month or as needed with readings. Prescriptions: ALPRAZOLAM 0.25 MG TABS (ALPRAZOLAM) 1-2 by mouth three times a day as needed anxiety  #30 x 1   Entered and Authorized by:   Madelin Headings MD   Signed by:   Madelin Headings MD on 05/08/2010   Method used:   Print then Give to Patient   RxID:   [id number]    Orders Added: 1)  Est. Patient Level IV [14782]   After pat left revied ed labs  ekg normal  cbc ok  bmp showed creatinine of 1.6   bun 20    last Cr  in EMR was 1.0 2 months ago.  wkp.   will need to repeat BMP   with lfts and tsh In January.  wkp.  Appended Document: elevated bp//ccm tell patient that i reviewed her ed labs  after she left and they were normal except her kidney function   which showed some increase in her creatinine . this could be a temporary issue  such as with  dehydration but would advise   repeat BMP with   LFTs and TSH before her visit in january.   Appended Document: elevated bp//ccm Pt aware and appts made.

## 2010-06-19 NOTE — Assessment & Plan Note (Signed)
Summary: follow up on lab/ssc   Vital Signs:  Patient profile:   55 year old female Menstrual status:  postmenopausal Weight:      247 pounds Pulse rate:   72 / minute BP sitting:   120 / 80  (left arm) Cuff size:   large  Vitals Entered By: Romualdo Bolk, CMA (AAMA) (June 10, 2010 3:30 PM)  Serial Vital Signs/Assessments:  Time      Position  BP       Pulse  Resp  Temp     By                     114/78                         Madelin Headings MD  Comments: right  arm  large cuff.  By: Madelin Headings MD   CC: Follow-up visit on labs, Hypertension Management   History of Present Illness: [name].t fpr follow up of multiple medical problems . BP : has log of such and generally slgihty higher in am 130- 140  rest nl except for a rare 160 .  back on  branded synthroid .    Dizzines some better  ocass   vertigo. Cramp in foot toe  at times   .   not on ppi currently   Hypertension History:      She complains of palpitations, dyspnea with exertion, neurologic problems, and syncope, but denies headache, chest pain, orthopnea, PND, peripheral edema, visual symptoms, and side effects from treatment.  She notes no problems with any antihypertensive medication side effects.  occ heart palps, feel cramps up at night, dizziness occ.        Positive major cardiovascular risk factors include female age 39 years old or older and hypertension.  Negative major cardiovascular risk factors include no history of diabetes and non-tobacco-user status.        Further assessment for target organ damage reveals no history of ASHD, cardiac end-organ damage (CHF/LVH), or stroke/TIA.     Preventive Screening-Counseling & Management  Alcohol-Tobacco     Alcohol drinks/day: 0     Smoking Status: never  Caffeine-Diet-Exercise     Caffeine use/day: <1     Does Patient Exercise: no  Current Medications (verified): 1)  Calcium 500 Mg Tabs (Calcium) .... Take Daily 2)  Synthroid 112  Mcg Tabs (Levothyroxine Sodium) .... Take 1 Tablet By Mouth Once A Day. 3)  Multiple Vitamin   Tabs (Multiple Vitamin) .... Daily 4)  Diovan Hct 320-25 Mg  Tabs (Valsartan-Hydrochlorothiazide) .Marland Kitchen.. 1 By Mouth Qd 5)  Norvasc 5 Mg  Tabs (Amlodipine Besylate) .Marland Kitchen.. 1 By Mouth Once Daily 6)  Flonase 50 Mcg/act  Susp (Fluticasone Propionate) .... 2 Sprays [place] Nares Q D  Allergies (verified): 1)  ! Penicillin 2)  ! Cefdinir (Cefdinir)  Past History:  Past medical, surgical, family and social histories (including risk factors) reviewed for relevance to current acute and chronic problems.  Past Medical History: Reviewed history from 06/11/2009 and no changes required. Depression GERD Hypothyroidism Headache  hx of migraines when younger  Fibromyalgia Hypertension ABnormal lfts presumes nafil  per [facility] g8p6 Hx palpitations with normal holter event monitoring.    Past Surgical History: Reviewed history from 02/18/2010 and no changes required. Tonsillectomy D & C Ectopic Pregnancy  Past History:  Care Management: Gynecology: [facility] Dermatology: [facility] Leatherwood: [facility] in the past Cards:  Occupational Health: [employer]- [employer] ENT:   Family History: Reviewed history from 11/10/2008 and no changes required. Family History Diabetes 1st degree relative  daughter with type 1 and2  Family History High cholesterol Family History Hypertension both parents Family History Osteoporosis ADHD   Social History: Reviewed history from 02/18/2010 and no changes required.   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 [place]  Job  stresses.  ex husband died this year     Review of Systems  The patient denies anorexia, fever, weight loss, vision loss, decreased hearing, hoarseness, chest pain, prolonged cough, abdominal pain, transient blindness,  depression, abnormal bleeding, enlarged lymph nodes, and angioedema.         nocturia chronic    interrupted sleep  Physical Exam  General:  Well-developed,well-nourished,in no acute distress; alert,appropriate and cooperative throughout examination Head:  normocephalic and atraumatic.   Lungs:  normal respiratory effort and no intercostal retractions.   Heart:  normal rate and regular rhythm.  see bp readings  Neurologic:  gossly non focal  Psych:  Oriented X3, memory intact for recent and remote, normally interactive, good eye contact, not anxious appearing, and not depressed appearing.   Bp log reviewed from last   month or so.  Impression & Recommendations:  Problem # 1:  HYPERTENSION, LABILE (ICD-401.9) still some up but much better  recently  ? if related to  the generic syntroigh does better on brand. Her updated medication list for this problem includes:    Diovan Hct 320-25 Mg Tabs (Valsartan-hydrochlorothiazide) .Marland Kitchen... 1 by mouth qd    Norvasc 5 Mg Tabs (Amlodipine besylate) .Marland Kitchen... 1 by mouth once daily  Problem # 2:  MUSCLE CRAMPS, FOOT (ICD-729.82) r/o low mg with diuretic use  doubt  Problem # 3:  Hx of FASTING HYPERGLYCEMIA (ICD-790.29) check aic  Orders: TLB-A1C / Hgb A1C (Glycohemoglobin) (83036-A1C) Venipuncture (16109) Specimen Handling (60454)  Problem # 4:  VERTIGO, POSITIONAL (ICD-386.11) an ddizziness  is better but still gets this.  Problem # 5:  OBESITY (ICD-278.00) counseled      strategies .    Ht: 65.5 (02/18/2010)   Wt: 247 (06/10/2010)   BMI: 39.80 (02/18/2010)  Problem # 6:  OTHER SLEEP DISTURBANCES (ICD-780.59) poss contributing  to above and interfer with weight loss.   Complete Medication List: 1)  Calcium 500 Mg Tabs (Calcium) .... Take daily 2)  Synthroid 112 Mcg Tabs (Levothyroxine sodium) .... Take 1 tablet by mouth once a day. 3)  Multiple Vitamin Tabs (Multiple vitamin) .... Daily 4)  Diovan Hct 320-25 Mg Tabs  (Valsartan-hydrochlorothiazide) .Marland Kitchen.. 1 by mouth qd 5)  Norvasc 5 Mg Tabs (Amlodipine besylate) .Marland Kitchen.. 1 by mouth once daily 6)  Flonase 50 Mcg/act Susp (Fluticasone propionate) .... 2 sprays eaach nares q d  Other Orders: TLB-Magnesium (Mg) (83735-MG) T-Vitamin D (25-Hydroxy) (09811-91478)  Hypertension Assessment/Plan:      The patient's hypertensive risk group is category B: At least one risk factor (excluding diabetes) with no target organ damage.  Today's blood pressure is 120/80.  Her blood pressure goal is < 140/90.  Patient Instructions: 1)  try taking  one or both   bp meds at night  .   2)  Continue on same thyroid med.  3)  Consider adding magnesium oxide  for muscle cramps and we can check a level.  4)  You will be informed of lab results when available.  5)  return office visit in 2-3 months  or asneeded.                                                                                                                                                                                                                                                                                 Orders Added: 1)  TLB-Magnesium (Mg) [83735-MG] 2)  T-Vitamin D (25-Hydroxy) [29528-41324] 3)  TLB-A1C / Hgb A1C (Glycohemoglobin) [83036-A1C] 4)  Venipuncture [40102] 5)  Specimen Handling [99000] 6)  Est. Patient Level IV [72536]   greater than 50% of visit spent in counseling   25 minutes

## 2010-07-23 LAB — URINALYSIS, ROUTINE W REFLEX MICROSCOPIC
Ketones, ur: NEGATIVE mg/dL
Leukocytes, UA: NEGATIVE
Nitrite: NEGATIVE
Specific Gravity, Urine: 1.006 (ref 1.005–1.030)

## 2010-07-23 LAB — BASIC METABOLIC PANEL
Calcium: 8.8 mg/dL (ref 8.4–10.5)
Creatinine, Ser: 1.6 mg/dL — ABNORMAL HIGH (ref 0.4–1.2)
GFR calc Af Amer: 40 mL/min — ABNORMAL LOW (ref >60)
Glucose, Bld: 100 mg/dL — ABNORMAL HIGH (ref 70–99)
Sodium: 143 mEq/L (ref 135–145)

## 2010-07-23 LAB — CBC
Hemoglobin: 12.9 g/dL (ref 12.0–15.0)
MCH: 30.3 pg (ref 26.0–34.0)
Platelets: 219 10*3/uL (ref 150–400)
RBC: 4.27 MIL/uL (ref 3.87–5.11)
WBC: 10.3 10*3/uL (ref 4.0–10.5)

## 2010-07-23 LAB — DIFFERENTIAL
Basophils Absolute: 0.1 10*3/uL (ref 0.0–0.1)
Basophils Relative: 1 % (ref 0–1)
Eosinophils Absolute: 0.1 10*3/uL (ref 0.0–0.7)
Lymphocytes Relative: 22 % (ref 12–46)
Lymphs Abs: 2.3 10*3/uL (ref 0.7–4.0)

## 2010-08-22 ENCOUNTER — Encounter: Payer: Self-pay | Admitting: Internal Medicine

## 2010-08-28 ENCOUNTER — Telehealth: Payer: Self-pay | Admitting: *Deleted

## 2010-08-28 ENCOUNTER — Emergency Department (HOSPITAL_COMMUNITY)
Admission: EM | Admit: 2010-08-28 | Discharge: 2010-08-28 | Disposition: A | Discharge status: Home / Self Care | Payer: BC Managed Care – PPO | Attending: Emergency Medicine | Admitting: Emergency Medicine

## 2010-08-28 ENCOUNTER — Ambulatory Visit: Payer: Self-pay | Admitting: Internal Medicine

## 2010-08-28 DIAGNOSIS — E039 Hypothyroidism, unspecified: Secondary | ICD-10-CM | POA: Insufficient documentation

## 2010-08-28 DIAGNOSIS — K219 Gastro-esophageal reflux disease without esophagitis: Secondary | ICD-10-CM | POA: Insufficient documentation

## 2010-08-28 DIAGNOSIS — I10 Essential (primary) hypertension: Secondary | ICD-10-CM | POA: Insufficient documentation

## 2010-08-28 DIAGNOSIS — R5381 Other malaise: Secondary | ICD-10-CM | POA: Insufficient documentation

## 2010-08-28 DIAGNOSIS — Z79899 Other long term (current) drug therapy: Secondary | ICD-10-CM | POA: Insufficient documentation

## 2010-08-28 DIAGNOSIS — R51 Headache: Secondary | ICD-10-CM | POA: Insufficient documentation

## 2010-08-28 LAB — POCT I-STAT, CHEM 8
Chloride: 100 mEq/L (ref 96–112)
Hemoglobin: 14.3 g/dL (ref 12.0–15.0)
Potassium: 3.5 mEq/L (ref 3.5–5.1)
TCO2: 27 mmol/L (ref 0–100)

## 2010-08-28 NOTE — Telephone Encounter (Signed)
[facility] called stating that pt's BP was 170/96, and she was slumped in chair complaining of not feeling well.   Advised ER ASAP.  Appt here cancelled.

## 2010-08-29 ENCOUNTER — Ambulatory Visit (INDEPENDENT_AMBULATORY_CARE_PROVIDER_SITE_OTHER): Payer: BC Managed Care – PPO | Admitting: Internal Medicine

## 2010-08-29 ENCOUNTER — Encounter: Payer: Self-pay | Admitting: Internal Medicine

## 2010-08-29 VITALS — BP 132/87 | HR 81 | Wt 236.0 lb

## 2010-08-29 DIAGNOSIS — I10 Essential (primary) hypertension: Secondary | ICD-10-CM

## 2010-08-29 DIAGNOSIS — Z5689 Other problems related to employment: Secondary | ICD-10-CM

## 2010-08-29 DIAGNOSIS — R51 Headache: Secondary | ICD-10-CM

## 2010-08-29 DIAGNOSIS — R232 Flushing: Secondary | ICD-10-CM

## 2010-08-29 DIAGNOSIS — Z566 Other physical and mental strain related to work: Secondary | ICD-10-CM

## 2010-08-29 DIAGNOSIS — R0989 Other specified symptoms and signs involving the circulatory and respiratory systems: Secondary | ICD-10-CM | POA: Insufficient documentation

## 2010-08-29 DIAGNOSIS — G478 Other sleep disorders: Secondary | ICD-10-CM

## 2010-08-29 NOTE — Patient Instructions (Signed)
Avoid the caffiene as much as possible . This still could  be a migraine  Issues.    Also .Marland Kitchen We can do  24 hour urine for  Ruling out  Other causes of spells.  Avoid decongestants for now.

## 2010-08-29 NOTE — Progress Notes (Signed)
Subjective:    Patient ID: [name], female    DOB: 06-28-1955, 55 y.o.   MRN: [id number]  HPI Patient comes in today with husband after being seen in the emergency room yesterday. We have been monitoring her blood pressure and it had been doing very well according to her. She brings in a flowsheet and almost all of the readings are in range many very good in the 120 range. She states that she went on vacation and stopped monitoring her blood pressure but did take her medication but perhaps didn't eat is healthy with more sodium salt in the food. She then returned to work which is very stressful environment. This past week she didn't feel as well and her blood pressure was creeping up into the 130 140  range. Yesterday she had a headache that was mild but with light sensitivity and she took in Excedrin. She ate lunch and soon after that felt fleshy and very bad tired and off-balance. She did not have sweats chest pain and shortness of breath. Because she felt so bad she was taken by ambulance to the emergency room because her blood pressure was 170. In the emergency room she had an EKG   Which showed LAD and nonspecific changesand basic chemistries which were Normal.  Since then she's rested in her blood pressure is in the 130 range still feels tired. She'is worried thather blood pressure is causing or these symptoms.   No fever vision changes fainting. Sleeps ok gets up to use br.  Some snoring but not severe all their husband says sometimes she stops.  He is on a CPAP machine.  excedrin  For HA  And then post eating felt flusshy and bad.  And swaying and tired .   exhaused   Not  Sweaty.  Past history family history social history reviewed in the electronic medical record.  Review of Systems No bleeding losing weight in a healthy dietary change tries to avoid sodium. She has no current vertigo weakness or new joint pain rest of ROS negative or noncontributory or asper  HPI  She has had  some [place] vaginitis they are treating with Monistat wonders if that is related.      Objective:   Physical Exam   Wt Readings from Last 3 Encounters:  08/29/10 236 lb (107.049 kg)  06/10/10 247 lb (112.038 kg)  05/08/10 245 lb (111.131 kg)    Well developed well-nourished and no acute distress looks healthy.  Blood pressure log reviewed blood pressure reading by nurse and her machine noted. Neck  No bruits or masses  Chest:  Clear to A&P without wheezes rales or rhonchi CV:  S1-S2 no gallops or murmurs peripheral perfusion is normal  Neuro non focal  Oriented x 3 and no noted deficits in memory, attention, and speech. Articulate and  Verbal.   Reviewed Hosp ED notes and labs    Assessment & Plan:  Hypertension with episode of lability. And feeling weak in general Unsure if she could've had something like a migraine or reaction to the caffeine that caused her to feel bad and then her blood pressure rise. Otherwise it'd been pretty well controlled.     This is under the chronic stress of her current job which she does well but is very frustrated. R/O  sleep apnea. We'll get overnight pulse ox screen from home health to see if we need to refer for further evaluation. Will also get a 24-hour urine for Catecholamines metanephrines  and 5HIAA  To r/o pheo and carcinoid.(  Doubt) Don't think the vaginitis is related to above.  Total visit > 50% spent counseling and coordinating care .

## 2010-09-02 ENCOUNTER — Telehealth: Payer: Self-pay | Admitting: *Deleted

## 2010-09-02 NOTE — Telephone Encounter (Signed)
Will review this data. 24 hour urine pending.

## 2010-09-02 NOTE — Telephone Encounter (Signed)
Pt is at school lying down with fatigue and BP 160/89, and HR is 77.  Wants to know if she should come home?  Husband is calling.

## 2010-09-02 NOTE — Telephone Encounter (Signed)
Per Dr. Fabian Sharp- go home and rest. Then call us with her bp readings.

## 2010-09-02 NOTE — Telephone Encounter (Signed)
Notified pt. 

## 2010-09-02 NOTE — Telephone Encounter (Signed)
Pt typed a letter saying that she just don't feel well and hasn't since the fall. Complaints are as followed: her bp can be fine but pulse is up, fatigue, weakness, sick more this year, muscular weakness, lethargic, skin problems, low sex drive, lightheadedness, decreased ability to handle stress, trouble waking up in am, trouble getting back to sleep, poor memory, excessive hair loss and some flushy responses.

## 2010-09-06 LAB — CATECHOLAMINES, FRACTIONATED, URINE, 24 HOUR
Calculated Total (E+NE): 34 mcg/24 h (ref 26–121)
Creatinine, Urine mg/day-CATEUR: 1.66 g/(24.h) (ref 0.63–2.50)
Total Volume - CF 24Hr U: 3700 mL

## 2010-09-07 LAB — 5 HIAA, QUANTITATIVE, URINE, 24 HOUR: 5-HIAA, 24 Hr Urine: 5.2 mg/24 h (ref <=6.0)

## 2010-09-07 LAB — METANEPHRINES, URINE, 24 HOUR
Metaneph Total, Ur: 378 mcg/24 h (ref 224–832)
Normetanephrine, 24H Ur: 311 mcg/24 h (ref 122–676)

## 2010-09-09 ENCOUNTER — Telehealth: Payer: Self-pay | Admitting: *Deleted

## 2010-09-09 NOTE — Telephone Encounter (Signed)
Blood pressure readings reviewed and most are within normal limits although soma or borderline low in the low 100s.  Due to number of notes that she is sent in.

## 2010-09-09 NOTE — Telephone Encounter (Signed)
date time   pressure   activity      09/02/2010                  4:32a   147-82-95   had to pee and store it for the test you ordered    7:35a   144-85-90   ready to walk out the door for work    8:23a   139-79-82   after a 35 min. drive to work    6:21H   086-57-84   after 1st period planning- sat and worked the whole time    12:07p   152-91-79   after 2nd and 3rd period- getting ready to eat lunch    12:32p   [phone]   after laying down for 20 mins. In a dark room on some chairs    1:07p   160-84-77   after walking to the bathroom and sitting up talking with a teacher,   laying down           felt poorly  took it down 1:45p   145-76-73   after lying down for the rest of 4th period  36 pts. In an 2:04p   124-76-73   after another 15 mins. of laying down. Decided to go to class. Got    hour           a call from your office to go home and rest and continue               taking record of my blood pressure    3:10p   137-78-74   after getting home      4:17p   [phone]   after lying down for an hour.decided to go with Ernie to the store and               get something to eat and some vitamines    5:32p   130-104-93 after sitting up in the restaurant and eating, getting in the car to go to               [facility]      6:07p   140-80-90   after laying back in the car    9:04p   134-83-73   had been on sofa sitting back    11:21p   136-82-83   sitting - before going to sleep    11:23p   103-76-107 standing -before going to sleep    11:24p   125-70-68   lying down before going to sleep  09/03/2010 FORGOT TO TAKE MY MEDS LAST NIGHT SO I HAD TO START TAKING THEM THIS MORNING                        8:29a   113-62-75   lying down- getting ready to get up    8:32a   121-81-83   sitting up        8:34a   010-27-253 standing up   (went to the bathroom and went back to bed)    9:19a   127-75-74   awake-  sitting      10:15a   130-85-75   sitting up        11:31p   156-79-82   sitting up at school      11:48p   129-83-78      "    "      12:24p   134-93-88      "    "  12:27p   045-40-98      "    "      1:40p          160-86-92    "     "      2:44p   119-14-782    "    " (dropped 45 points in 1 hour??)    2:49p   122-71-90            5:17p   129-108-98 (high bottom number?)    6:07p   956-21-30            8:02p   122-86-101          9:57p   865-78-46          09/04/2010                  7:09a   962-95-28   got up        8:42a   413-24-40   before going to school    12:15p   126-71-77   before lunch      12:53p   144-64-81   after lunch      3:53p   129-80-79   almost after school      5:36p   135-77-85            5:41p   157-84-71   after dinner and :) a cup of coffee ( just was experimenting)    9:16p   138-82-72   before bed    09/05/2010                  6:21a   109-61-70  laying down      7:25a   142-75-83   sitting before leaving for school    11:47a   124-77-80   sitting before lunch    6:15p   115-56-89   sitting  low bottom numbers    8:22p   116-65-83   sitting      ate time   pressure   activity     09/02/2010                 4:32a   115-55-85   had to pee and store it for the test you ordered   7:35a   144-85-90   ready to walk out the door for work   8:23a   139-79-82   after a 35 min. drive to work   1:02V   253-66-44   after 1st period planning- sat and worked the whole time   12:07p   152-91-79   after 2nd and 3rd period- getting ready to eat lunch   12:32p   [id number]   after laying down for 20 mins. In a dark room on some chairs   1:07p   160-84-77   after walking to the bathroom and sitting up talking with a teacher,  laying down           felt poorly took it down 1:45p   145-76-73   after lying down for the rest of  4th period 36 pts. In an 2:04p   124-76-73   after another 15 mins. of laying down. Decided to go to class. Got   hour           a call from your office to go home and rest and continue              taking record of my blood  pressure   3:10p   137-78-74   after getting home     4:17p   [phone]   after lying down for an hour.decided to go with Ernie to the store and              get something to eat and some vitamines   5:32p   130-104-93 after sitting up in the restaurant and eating, getting in the car to go to              [facility]     6:07p   140-80-90   after laying back in the car   9:04p   134-83-73   had been on sofa sitting back   11:21p   136-82-83   sitting - before going to sleep   11:23p   103-76-107 standing -before going to sleep   11:24p   125-70-68   lying down before going to sleep 09/03/2010 FORGOT TO TAKE MY MEDS LAST NIGHT SO I HAD TO START TAKING THEM THIS MORNING                       8:29a   113-62-75   lying down- getting ready to get up   8:32a   121-81-83   sitting up       8:34a   454-09-811 standing up   (went to the bathroom and went back to bed)   9:19a   127-75-74   awake- sitting     10:15a   130-85-75   sitting up       11:31p   156-79-82   sitting up at school     11:48p   129-83-78      "    "     12:24p   134-93-88      "    "     12:27p   131-76-88      "    "     1:40p          160-86-92    "     "     2:44p   914-78-295    "    " (dropped 45 points in 1 hour??)   2:49p   122-71-90           5:17p   129-108-98 (high bottom number?)   6:07p   621-30-86           8:02p   122-86-101         9:57p   578-46-96         09/04/2010                 7:09a   295-28-41   got up       8:42a   324-40-10   before going to school   12:15p   126-71-77   before lunch     12:53p   144-64-81   after lunch     3:53p   129-80-79   almost after school     5:36p    135-77-85           5:41p   157-84-71   after dinner and :) a cup of coffee ( just was experimenting)   9:16p   138-82-72   before bed   09/05/2010                 6:21a   109-61-70  laying down     7:25a  142-75-83   sitting before leaving for school   11:47a   124-77-80   sitting before lunch   6:15p   115-56-89   sitting  low bottom numbers   8:22p   116-65-83   sitting                               date time   pressure   activity     09/02/2010                 4:32a   161-09-60   had to pee and store it for the test you ordered   7:35a   144-85-90   ready to walk out the door for work   8:23a   139-79-82   after a 35 min. drive to work   4:54U   981-19-14   after 1st period planning- sat and worked the whole time   12:07p   152-91-79   after 2nd and 3rd period- getting ready to eat lunch   12:32p   [phone]   after laying down for 20 mins. In a dark room on some chairs   1:07p   160-84-77   after walking to the bathroom and sitting up talking with a teacher,  laying down           felt poorly took it down 1:45p   145-76-73   after lying down for the rest of 4th period 36 pts. In an 2:04p   124-76-73   after another 15 mins. of laying down. Decided to go to class. Got   hour           a call from your office to go home and rest and continue              taking record of my blood pressure   3:10p   137-78-74   after getting home     4:17p   [phone]   after lying down for an hour.decided to go with Ernie to the store and              get something to eat and some vitamines   5:32p   130-104-93 after sitting up in the restaurant and eating, getting in the car to go to              [facility]     6:07p   140-80-90   after laying back in the car   9:04p   134-83-73   had been on sofa sitting back   11:21p   136-82-83   sitting - before going to sleep   11:23p   103-76-107 standing  -before going to sleep   11:24p   125-70-68   lying down before going to sleep 09/03/2010 FORGOT TO TAKE MY MEDS LAST NIGHT SO I HAD TO START TAKING THEM THIS MORNING                       8:29a   113-62-75   lying down- getting ready to get up   8:32a   121-81-83   sitting up       8:34a   846-96-295 standing up   (went to the bathroom and went back to bed)   9:19a   127-75-74   awake- sitting     10:15a   130-85-75   sitting up       11:31p  811-91-47   sitting up at school     11:48p   129-83-78      "    "     12:24p   134-93-88      "    "     12:27p   131-76-88      "    "     1:40p          160-86-92    "     "     2:44p   829-56-213    "    " (dropped 45 points in 1 hour??)   2:49p   122-71-90           5:17p   129-108-98 (high bottom number?)   6:07p   086-57-84           8:02p   122-86-101         9:57p   696-29-52         09/04/2010                 7:09a   841-32-44   got up       8:42a   010-27-25   before going to school   12:15p   126-71-77   before lunch     12:53p   144-64-81   after lunch     3:53p   129-80-79   almost after school     5:36p   135-77-85           5:41p   157-84-71   after dinner and :) a cup of coffee ( just was experimenting)   9:16p   138-82-72   before bed   09/05/2010                 6:21a   109-61-70  laying down     7:25a   142-75-83   sitting before leaving for school   11:47a   124-77-80   sitting before lunch   6:15p   115-56-89   sitting  low bottom numbers   8:22p   366-44-03   sitting     This was last evening and this morning.  8:21pm 116-65-83 sitting  10:43pm 123-73-85 lean sitting  11:06pm 105-61-76 laying down  4:24am 102-56-69 laying down ( woke up and couldn't go back to sleep until 6:00)  7:09am 124-70-78 laying down  7:11am 117-78-78 sitting up  7:14am 117-81-107 standing up

## 2010-09-10 ENCOUNTER — Telehealth: Payer: Self-pay | Admitting: *Deleted

## 2010-09-10 NOTE — Telephone Encounter (Signed)
Message copied by Tor Netters on Tue Sep 10, 2010  2:43 PM ------      Message from: [facility], [place] [facility]      Created: Mon Sep 09, 2010  5:39 PM       Tell patient that her urine tests are normal.  Did review all of her blood pressure readings. Most of them are fine.      I would not keep checking her blood pressure so often anymore now that we have established a baseline.              We could try decreasing  amlodipine to a half a pill a day.  We could also get Dr. Tenny Craw to see you for an opinion about her blood pressure. I still think there could be stress and headache issues that are the cause of problem.  Let me know when we get the  overnight pulse oximetry readings.

## 2010-09-10 NOTE — Telephone Encounter (Signed)
Left message on machine about below and to call us back to let us know if she is going to decrease the norvasc to 1/2 tab daily. Also if she wants to see Dr. Tenny Craw for an opinion about her bp.

## 2010-09-11 NOTE — Telephone Encounter (Signed)
Pt called back saying that she is going to take 1/2 tab but doesn't want to do the referral to Dr. Tenny Craw yet. She is going to monitor her bp and see what happens then go from there.

## 2010-09-12 NOTE — Telephone Encounter (Signed)
Plan ov in 1 month then

## 2010-09-13 NOTE — Telephone Encounter (Signed)
Left message on machine to call back and make an appt in 1 month.

## 2010-09-24 ENCOUNTER — Telehealth: Payer: Self-pay | Admitting: Internal Medicine

## 2010-09-24 NOTE — Telephone Encounter (Signed)
We don't have the results yet. Need to know when she had this done. Left message on machine about this.

## 2010-09-24 NOTE — Telephone Encounter (Signed)
Pt req results from oxygen test.

## 2010-09-26 NOTE — Telephone Encounter (Signed)
Spoke to pt and she had it done 2 days after the last ov. She is going to have her husband to call them to see why we haven't gotten the results yet.

## 2010-09-27 NOTE — Op Note (Signed)
[place]  Patient:    [name], DALPE                      MRN: [id number] Proc. Date: 09/02/00 Adm. Date:  [id number] Attending:  Loura Halt Ii                           Operative Report  PREOPERATIVE DIAGNOSIS:  Biopsy proven squamous cell carcinoma, left lateral leg, 1.2 cm.  POSTOPERATIVE DIAGNOSIS:  Biopsy proven squamous cell carcinoma, left lateral leg, 1.2 cm.  OPERATION PERFORMED:  Excision of squamous cell carcinoma, left lateral leg with wound left open for secondary healing.  SURGEON:  Alfredia Ferguson, M.D.  ANESTHESIA:  2% Xylocaine 1:100,000 epinephrine.  INDICATIONS FOR PROCEDURE:  The patient is a 55 year old woman with biopsy proven squamous cell carcinoma, left lateral calf area.  She wishes to have it excised.  The patient had fairly tight legs and it is unlikely we will be able to close this without undue tension.  The patient understands that and wishes to proceed.  DESCRIPTION OF PROCEDURE:  Skin markers were placed around the lesion with about 2 mm margins.  Local anesthesia was infiltrated and the area was prepped and draped in a sterile fashion.  After awaiting approximately 10 minutes, a circular excision of the lesion down to the level of the subcutaneous tissues was carried out.  Hemostasis was accomplished using cautery.  Lesion was marked with suture for margin purposes.  The lesion was passed off.  Attempt to close this wound was made but it was so tight, I could not get the wound edges together.  For that reason, I left the wound open.  The wound was dressed appropriately.  The patient was discharged home in satisfactory condition. DD:  09/03/00 TD:  09/03/00 Job: [id number]

## 2010-10-31 ENCOUNTER — Encounter: Payer: Self-pay | Admitting: Internal Medicine

## 2010-10-31 ENCOUNTER — Ambulatory Visit (INDEPENDENT_AMBULATORY_CARE_PROVIDER_SITE_OTHER): Payer: BC Managed Care – PPO | Admitting: Internal Medicine

## 2010-10-31 VITALS — BP 132/76 | HR 77 | Resp 18 | Ht 66.0 in | Wt 231.0 lb

## 2010-10-31 DIAGNOSIS — E669 Obesity, unspecified: Secondary | ICD-10-CM

## 2010-10-31 DIAGNOSIS — I10 Essential (primary) hypertension: Secondary | ICD-10-CM

## 2010-10-31 DIAGNOSIS — E785 Hyperlipidemia, unspecified: Secondary | ICD-10-CM

## 2010-10-31 NOTE — Progress Notes (Signed)
HPI  Patient is a 55 year old with a history of hypertension.  She is followed by [name].  I saw her in clinic years ago (chart in archives) She presents for reevaluation of her hypertension. She denies chest pains   Breathing is OK She has had a couple episodes wehre her BP has been signif elevated.  These were related to stressful situation.  BP was 170 at highest. Machine readings on average are much better, 120s to 140.  She does say that she is at increased stress at work.  Would like to modify but has not.   Allergies  Allergen Reactions  . Cefdinir     REACTION: Rash, swelling and itching  . Penicillins     REACTION: swelling up as a child    Current Outpatient Prescriptions  Medication Sig Dispense Refill  . amLODipine (NORVASC) 5 MG tablet Take 2.5 mg by mouth daily.       . calcium carbonate (CALCIUM 500) 1250 MG tablet Take 1 tablet by mouth daily.        . fluticasone (FLONASE) 50 MCG/ACT nasal spray 2 sprays by Nasal route daily.        Marland Kitchen levothyroxine (SYNTHROID, LEVOTHROID) 112 MCG tablet Take 112 mcg by mouth daily. Brand      . MULTIPLE VITAMIN PO Take by mouth.        . valsartan-hydrochlorothiazide (DIOVAN-HCT) 320-25 MG per tablet Take 1 tablet by mouth daily.          Past Medical History  Diagnosis Date  . Depression   . GERD (gastroesophageal reflux disease)   . Hypothyroidism   . Headache     hx of migraines when younger  . Fibromyalgia   . Hypertension   . Heart palpitations     hx with normal holter event monitoring  . Hx: UTI (urinary tract infection)     Past Surgical History  Procedure Date  . Dilation and curettage of uterus   . Tonsillectomy   . Ectopic pregnancy surgery     Family History  Problem Relation Age of Onset  . Hypertension Mother     low borderline  . Hypertension Father   . Liver disease Father   . Diabetes Daughter   . ADD / ADHD    . Hyperlipidemia      Maternal grandmother  . Osteoporosis      History    Social History  . Marital Status: Married    Spouse Name: N/A    Number of Children: N/A  . Years of Education: N/A   Occupational History  . Not on file.   Social History Main Topics  . Smoking status: Never Smoker   . Smokeless tobacco: Not on file  . Alcohol Use: Yes     socially (2-3 x times a year)  . Drug Use: No  . Sexually Active: Not on file   Other Topics Concern  . 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 Lives in farm like areaJob stressesEx husband passes away    Review of Systems:  All systems reviewed.  They are negative to the above problem except as previously stated.  Vital Signs: BP 132/76  Pulse 77  Resp 18  Ht 5\' 6"  (1.676 m)  Wt 231 lb (104.781 kg)  BMI 37.28 kg/m2  Physical Exam Patient is in NAD  HEENT:  Normocephalic,  atraumatic. EOMI, PERRLA.  Neck: JVP is normal. No thyromegaly. No bruits.  Lungs: clear to auscultation. No rales no wheezes.  Heart: Regular rate and rhythm. Normal S1, S2. No S3.   No significant murmurs. PMI not displaced.  Abdomen:  Supple, nontender. Normal bowel sounds. No masses. No hepatomegaly.  Extremities:   Good distal pulses throughout. No lower extremity edema.  Musculoskeletal :moving all extremities.  Neuro:   alert and oriented x3.  CN II-XII grossly intact.  EKG:  NSR.  77 bpm.  Nonspecific ST T wave changes.   Assessment and Plan:

## 2010-11-01 ENCOUNTER — Telehealth: Payer: Self-pay | Admitting: Internal Medicine

## 2010-11-01 DIAGNOSIS — G4730 Sleep apnea, unspecified: Secondary | ICD-10-CM

## 2010-11-01 DIAGNOSIS — E785 Hyperlipidemia, unspecified: Secondary | ICD-10-CM | POA: Insufficient documentation

## 2010-11-01 NOTE — Telephone Encounter (Signed)
Pt called req Oxygen Sensor Test from [facility]. Did Dr Lambert Keto the fax from [facility] re: results from test?

## 2010-11-01 NOTE — Assessment & Plan Note (Signed)
Encouraged her to continue to lose wt.

## 2010-11-01 NOTE — Telephone Encounter (Signed)
Called [facility] and they are going to fax them over again. Left message on machine about this.

## 2010-11-01 NOTE — Assessment & Plan Note (Signed)
Needs tighter control.  Discussed diet changes and wt loss.  I would not begin Rx now.

## 2010-11-01 NOTE — Assessment & Plan Note (Signed)
Patient very anxious about her blood pressure.  I reassured her.  Overall it is under good control.  With stress BP can go up.  We discussed life style changes to cope with stress (mindfulness).  She will work on   I will see as needed.

## 2010-11-04 NOTE — Telephone Encounter (Signed)
Apologize for misplacing the original papers.   The screening pulse ox did show  Some desaturations. But doesn't tell us why this happens. I would like her to see the Pulmonary sleep doctors with a copy of the screening test  And opinion if  more evaluation and treatment should be done.

## 2010-11-05 ENCOUNTER — Encounter: Payer: Self-pay | Admitting: Internal Medicine

## 2010-11-05 NOTE — Telephone Encounter (Signed)
Pt aware of results. Order placed for referral.

## 2010-11-27 ENCOUNTER — Encounter: Payer: Self-pay | Admitting: Pulmonary Disease

## 2010-11-27 ENCOUNTER — Ambulatory Visit (INDEPENDENT_AMBULATORY_CARE_PROVIDER_SITE_OTHER): Payer: BC Managed Care – PPO | Admitting: Pulmonary Disease

## 2010-11-27 VITALS — BP 138/80 | HR 89 | Temp 98.6°F | Ht 66.0 in | Wt 236.2 lb

## 2010-11-27 DIAGNOSIS — G4733 Obstructive sleep apnea (adult) (pediatric): Secondary | ICD-10-CM | POA: Insufficient documentation

## 2010-11-27 NOTE — Progress Notes (Signed)
  Subjective:    Patient ID: [name], female    DOB: 19-Jul-1955, 55 y.o.   MRN: [id number]  HPI The pt is a 54y/o female who I have been asked to see for possible osa.  The pt has had sleep studies in the past that she tells me did not show osa, but this was over 10 yrs ago.  Her history is + for: -snoring noted by husband, as well as an abnormal breathing pattern during sleep. -she has had issues in the past with awakening at night and having a hard time returning to sleep, but this is much better since taking a magnesium supplement and melatonin. -unrested upon awakening most am's -some sleep pressure with quiet times during the day, and will take a nap at least 2-3 days/7 during summer.  Denies any issues with watching tv or movies.  No sleepiness with driving. -weight down 30 pounds last 3 yrs, and epworth score today is 2. -recent ONO with low sat 84%, and only spent less than 89% entire night  Sleep Questionnaire: What time do you typically go to bed?( Between what hours) 10 to 11 How long does it take you to fall asleep? go to sleep within 10 mins How many times during the night do you wake up? 1 What time do you get out of bed to start your day? 0700 Do you drive or operate heavy machinery in your occupation? No How much has your weight changed (up or down) over the past two years? (In pounds) 34 lb (15.422 kg) Have you ever had a sleep study before? Yes If yes, location of study? [facility] If yes, date of study? approx 3 years ago Do you currently use CPAP? No Do you wear oxygen at any time? No    Review of Systems  Constitutional: Negative for fever and unexpected weight change.  HENT: Positive for congestion and trouble swallowing. Negative for ear pain, nosebleeds, sore throat, rhinorrhea, sneezing, dental problem, postnasal drip and sinus pressure.   Eyes: Negative for redness and itching.  Respiratory: Positive for cough. Negative for chest tightness, shortness of  breath and wheezing.   Cardiovascular: Positive for palpitations. Negative for leg swelling.  Gastrointestinal: Negative for nausea and vomiting.  Genitourinary: Negative for dysuria.  Musculoskeletal: Negative for joint swelling.  Skin: Negative for rash.  Neurological: Negative for headaches.  Hematological: Does not bruise/bleed easily.  Psychiatric/Behavioral: Negative for dysphoric mood. The patient is not nervous/anxious.        Objective:   Physical Exam Constitutional:  Obese female, no acute distress  HENT:  Nares patent without discharge  Oropharynx without exudate, palate and uvula are normal  Eyes:  Perrla, eomi, no scleral icterus  Neck:  No JVD, no TMG  Cardiovascular:  Normal rate, regular rhythm, no rubs or gallops.  No murmurs        Intact distal pulses  Pulmonary :  Normal breath sounds, no stridor or respiratory distress   No rales, rhonchi, or wheezing  Abdominal:  Soft, nondistended, bowel sounds present.  No tenderness noted.   Musculoskeletal:  No significant lower extremity edema noted.  Lymph Nodes:  No cervical lymphadenopathy noted  Skin:  No cyanosis noted  Neurologic:  Alert, appropriate, moves all 4 extremities without obvious deficit.         Assessment & Plan:

## 2010-11-27 NOTE — Assessment & Plan Note (Signed)
The pt's history is suggestive of osa, but she does not have excessive daytime symptoms (does have some sleepiness though).  She is obese, snores with abnormal breathing pattern during sleep, has nonrestorative sleep, and has had ONO that shows some mild oxygen desaturation (although this could be due to hypoaeration during sleep from her centripetal obesity).  I think she would benefit from a sleep study, and can be done at home given her history and lack of comorbid medical conditions.

## 2010-11-27 NOTE — Patient Instructions (Signed)
Will set up for home sleep testing, and call you with results. Continue working on weight loss, you are doing well.

## 2010-12-10 ENCOUNTER — Ambulatory Visit (INDEPENDENT_AMBULATORY_CARE_PROVIDER_SITE_OTHER): Payer: BC Managed Care – PPO | Admitting: Pulmonary Disease

## 2010-12-10 DIAGNOSIS — G4733 Obstructive sleep apnea (adult) (pediatric): Secondary | ICD-10-CM

## 2010-12-14 ENCOUNTER — Telehealth: Payer: Self-pay | Admitting: Pulmonary Disease

## 2010-12-14 NOTE — Telephone Encounter (Signed)
Pt needs ov this week to discuss sleep study results

## 2010-12-14 NOTE — Assessment & Plan Note (Signed)
The pt has very mild osa, and treatment options can include weight loss, surgery, dental appliance, and cpap.  Will arrange OV to discuss results.

## 2010-12-14 NOTE — Progress Notes (Signed)
The pt underwent home sleep testing with a type 3 portable device.  Airflow, effort, oxygen saturation, and pulse rate were all monitored and recorded during this time.  The raw data and tracings have been reviewed, with the following findings:  1) flow evaluation period of 7hrs and 2) the pt was found to have 9 obstructive apneas, 6 central apneas, and 36 obstructive hypopneas.  AHI 7/hr 3) low oxygen saturation of 81%, and only 4 min spent less than or equal to 88%. 4) the pt notes that she went to bed 12mn, awakened a few times very briefly, and then awakened 6:15 without return to sleep.

## 2010-12-20 NOTE — Telephone Encounter (Signed)
LMTCB

## 2010-12-23 NOTE — Telephone Encounter (Signed)
Called and spoke with pt.  Offered her an appt to discuss sleep study results. Pt states she cannot afford to pay her $70 copay.  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  Pt wanted to know if KC would be willing to discuss her results over the phone or by email.  KC, please advise.  Thanks.

## 2010-12-24 ENCOUNTER — Encounter: Payer: Self-pay | Admitting: Pulmonary Disease

## 2010-12-24 NOTE — Telephone Encounter (Signed)
Discussed results of sleep study with her.  Gave her treatment options of weight loss alone, cpap, and dental appliance.  She will decide about options, check with insurance coverage, and get back with me if she decides to treat this more aggressively .

## 2011-02-06 ENCOUNTER — Ambulatory Visit (INDEPENDENT_AMBULATORY_CARE_PROVIDER_SITE_OTHER): Payer: BC Managed Care – PPO | Admitting: Internal Medicine

## 2011-02-06 ENCOUNTER — Encounter: Payer: Self-pay | Admitting: Internal Medicine

## 2011-02-06 DIAGNOSIS — E039 Hypothyroidism, unspecified: Secondary | ICD-10-CM

## 2011-02-06 DIAGNOSIS — L659 Nonscarring hair loss, unspecified: Secondary | ICD-10-CM

## 2011-02-06 DIAGNOSIS — I10 Essential (primary) hypertension: Secondary | ICD-10-CM

## 2011-02-06 DIAGNOSIS — E559 Vitamin D deficiency, unspecified: Secondary | ICD-10-CM

## 2011-02-06 DIAGNOSIS — G4733 Obstructive sleep apnea (adult) (pediatric): Secondary | ICD-10-CM

## 2011-02-06 DIAGNOSIS — M722 Plantar fascial fibromatosis: Secondary | ICD-10-CM

## 2011-02-06 DIAGNOSIS — R0989 Other specified symptoms and signs involving the circulatory and respiratory systems: Secondary | ICD-10-CM

## 2011-02-06 DIAGNOSIS — R252 Cramp and spasm: Secondary | ICD-10-CM

## 2011-02-06 DIAGNOSIS — M25569 Pain in unspecified knee: Secondary | ICD-10-CM

## 2011-02-06 LAB — CBC
HCT: 37.5
Hemoglobin: 12.6
MCHC: 33.5
MCV: 86.9
Platelets: 230
RBC: 4.32
RDW: 13.9
WBC: 8.5

## 2011-02-06 LAB — CBC WITH DIFFERENTIAL/PLATELET
Basophils Absolute: 0 10*3/uL (ref 0.0–0.1)
Basophils Relative: 0.6 % (ref 0.0–3.0)
Eosinophils Absolute: 0.1 10*3/uL (ref 0.0–0.7)
Lymphocytes Relative: 33.4 % (ref 12.0–46.0)
MCHC: 32.7 g/dL (ref 30.0–36.0)
Neutrophils Relative %: 58.8 % (ref 43.0–77.0)
RBC: 4.32 Mil/uL (ref 3.87–5.11)
RDW: 13.6 % (ref 11.5–14.6)

## 2011-02-06 LAB — DIFFERENTIAL
Basophils Absolute: 0
Basophils Relative: 0
Eosinophils Absolute: 0.1
Eosinophils Relative: 1
Lymphocytes Relative: 27
Lymphs Abs: 2.3
Monocytes Absolute: 0.6
Monocytes Relative: 7
Neutro Abs: 5.5
Neutrophils Relative %: 65

## 2011-02-06 LAB — URINALYSIS, ROUTINE W REFLEX MICROSCOPIC
Bilirubin Urine: NEGATIVE
Glucose, UA: NEGATIVE
Hgb urine dipstick: NEGATIVE
Ketones, ur: NEGATIVE
Nitrite: NEGATIVE
Protein, ur: NEGATIVE
Specific Gravity, Urine: 1.008
Urobilinogen, UA: 0.2
pH: 6

## 2011-02-06 LAB — COMPREHENSIVE METABOLIC PANEL WITH GFR
ALT: 27
Albumin: 3.9
Alkaline Phosphatase: 88
Potassium: 4
Sodium: 138
Total Protein: 7.1

## 2011-02-06 LAB — LIPASE, BLOOD: Lipase: 16

## 2011-02-06 LAB — COMPREHENSIVE METABOLIC PANEL
AST: 31
BUN: 16
CO2: 29
Calcium: 9.4
Chloride: 103
Creatinine, Ser: 0.77
GFR calc Af Amer: >60
GFR calc non Af Amer: >60
Glucose, Bld: 101 — ABNORMAL HIGH
Total Bilirubin: 0.8

## 2011-02-06 LAB — POCT PREGNANCY, URINE
Operator id: 231701
Preg Test, Ur: NEGATIVE

## 2011-02-06 NOTE — Patient Instructions (Signed)
Need modified  Work situation and then reassess after a month. Will notify you  of labs when available.

## 2011-02-06 NOTE — Progress Notes (Signed)
Subjective:    Patient ID: [name], female    DOB: 22-Oct-1955, 55 y.o.   MRN: [id number]  HPI Patient comes in today for SDA  For acute problem evaluation. But has a number of issues or recenet onset. 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    Push wheelchair around the school. Over different terrain and other physical tasks in  attending for this student Plantar fasciitis and knee flaring up.   After 10 days  Of doing this .   And shoulder aching.  Taking measures but concerned it could get worse    Using glider at home to help knee.  But does adapted exercise  Remote hx of arthroscopic on knees.  Bilaterally .   BP seems ok when not stressed   Hair  :  Shedding a lot    No skin changes  Does color hair .  No change.   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  Some  No current cp sob fever bleeding syncope. Vision changes.   Past Medical History  Diagnosis Date  . Depression   . GERD (gastroesophageal reflux disease)   . Hypothyroidism   . Headache     hx of migraines when younger  . Fibromyalgia   . Hypertension   . Heart palpitations     hx with normal holter event monitoring  . Hx: UTI (urinary tract infection)   . Skin cancer     basal, squamous cell   Past Surgical History  Procedure Date  . Dilation and curettage of uterus   . Tonsillectomy   . Ectopic pregnancy surgery     reports that she has never smoked. She does not have any smokeless tobacco history on file. She reports that she drinks alcohol. She reports that she does not use illicit drugs. family history includes ADD / ADHD in an unspecified family member; Diabetes in her daughter; Hyperlipidemia in an unspecified family member; Hypertension in her father and mother; Liver disease in her father; Melanoma in her sister; and Osteoporosis in an unspecified family  member. Allergies  Allergen Reactions  . Cefdinir     REACTION: Rash, swelling and itching  . Penicillins     REACTION: swelling up as a child       Objective:   Physical Exam  HEENT: Normocephalic ;atraumatic , Eyes;  PERRL, EOMs  Full, lids and conjunctiva clear,,Ears: no deformities, canals nl, TM landmarks normal, Nose: no deformity or discharge  Mouth : OP clear without lesion or edema . No clubbing cyanosis  Neck: Supple without adenopathy or masses or bruits Chest:  Clear to A&P without wheezes rales or rhonchi CV:  S1-S2 no gallops or murmurs peripheral perfusion is normal Ext No redness or swelling  Som tenderness plantar surface .  Goo romof knees some oa changes? No clubbing cyanosis or edema  Skin: normal capillary refill ,turgor , color: No acute rashes ,petechiae or bruising hair looks normal colored      Assessment & Plan:  Knee pain plantar fasciitis aggravated by current assigned physical work  Tasks. I advise only light lifting less than 15 pounds , pushing and walking.  She can continue her other exercise at home. Ice as needed  HAir changes possibly age related but rule out thyroid dysfunction on current situation and iron studies. Sleep  very mild sleep apnea check iron studies vitamin D. Hypertension seems to be better when less stressed continue her medication   Letter written   Fu in 1 month

## 2011-02-06 NOTE — Assessment & Plan Note (Signed)
Mild and no current intervention and low risk

## 2011-02-07 LAB — BASIC METABOLIC PANEL WITH GFR
Calcium: 10 mg/dL (ref 8.4–10.5)
Creat: 0.86 mg/dL (ref 0.50–1.10)
GFR, Est African American: >60 mL/min (ref >60)
Glucose, Bld: 88 mg/dL (ref 70–99)
Sodium: 143 mEq/L (ref 135–145)

## 2011-02-07 LAB — FERRITIN: Ferritin: 46.3 ng/mL (ref 10.0–291.0)

## 2011-02-07 LAB — IBC PANEL
Iron: 68 ug/dL (ref 42–145)
Transferrin: 270.3 mg/dL (ref 212.0–360.0)

## 2011-02-13 ENCOUNTER — Encounter: Payer: Self-pay | Admitting: *Deleted

## 2011-02-24 ENCOUNTER — Other Ambulatory Visit: Payer: Self-pay | Admitting: Internal Medicine

## 2011-02-28 ENCOUNTER — Ambulatory Visit: Payer: BC Managed Care – PPO | Admitting: Internal Medicine

## 2011-03-11 ENCOUNTER — Ambulatory Visit (INDEPENDENT_AMBULATORY_CARE_PROVIDER_SITE_OTHER): Payer: BC Managed Care – PPO | Admitting: Internal Medicine

## 2011-03-11 ENCOUNTER — Encounter: Payer: Self-pay | Admitting: Internal Medicine

## 2011-03-11 VITALS — BP 130/89 | HR 73 | Wt 232.0 lb

## 2011-03-11 DIAGNOSIS — M722 Plantar fascial fibromatosis: Secondary | ICD-10-CM

## 2011-03-11 DIAGNOSIS — R5381 Other malaise: Secondary | ICD-10-CM

## 2011-03-11 DIAGNOSIS — I10 Essential (primary) hypertension: Secondary | ICD-10-CM

## 2011-03-11 DIAGNOSIS — Z5689 Other problems related to employment: Secondary | ICD-10-CM

## 2011-03-11 DIAGNOSIS — Z566 Other physical and mental strain related to work: Secondary | ICD-10-CM

## 2011-03-11 DIAGNOSIS — G4733 Obstructive sleep apnea (adult) (pediatric): Secondary | ICD-10-CM

## 2011-03-11 DIAGNOSIS — R0989 Other specified symptoms and signs involving the circulatory and respiratory systems: Secondary | ICD-10-CM

## 2011-03-11 DIAGNOSIS — R5383 Other fatigue: Secondary | ICD-10-CM

## 2011-03-11 DIAGNOSIS — E669 Obesity, unspecified: Secondary | ICD-10-CM

## 2011-03-11 DIAGNOSIS — Z23 Encounter for immunization: Secondary | ICD-10-CM

## 2011-03-11 NOTE — Patient Instructions (Signed)
Aerobic exercise is good for  Mental clarity/ sleep     But need to avoid  things that aggravate ortho problems.   20+ minutes 3-5 x per week to start.  Suggest see Dr Shelle Iron again? Advisability of whether sleep apnea rx will help the sx of mental fogginess, interrupted sleep, and labile hypertension that you are having now .  Melatonin ok to try.  Read on sleep hygiene.

## 2011-03-11 NOTE — Progress Notes (Signed)
  Subjective:    Patient ID: [name], female    DOB: [birth year], 55 y.o.   MRN: [id number]  HPI Patient comes in today for follow up of  multiple medical problems.    BP:  Up and down.   Worse since  In school.  Mostly controlled but about 30 % out of range and   Heel pain   Stable. Wearing good shoes. Knee and back better when not pushing wheel  Chairs.  Sleep: disrupted.  Unsure why.   Many factors possible  Back  No  progression  Review of Systems NO fever cp sob new rash  Ne GI GU issues  Past history family history social history reviewed in the electronic medical record.     Objective:   Physical Exam WDWN  in nad  Gait normal   Neuro grossly non focal . Reviewed BP log  and interview.   Lab Results  Component Value Date   WBC 7.3 02/06/2011   HGB 13.0 02/06/2011   HCT 39.7 02/06/2011   PLT 207.0 02/06/2011   GLUCOSE 88 02/06/2011   CHOL 227* 02/18/2010   TRIG 162.0* 02/18/2010   HDL 53.90 02/18/2010   LDLDIRECT 146.9 02/18/2010   ALT 26 05/23/2010   AST 24 05/23/2010   NA 143 02/06/2011   K 4.3 02/06/2011   CL 103 02/06/2011   CREATININE 0.86 02/06/2011   BUN 14 02/06/2011   CO2 29 02/06/2011   TSH 1.33 02/06/2011   HGBA1C 5.6 06/10/2010        Assessment & Plan:   Hypertension:  blood pressure labile since back t owork   Much better in the summer when less stress.  ? About a generic    If a change she will check in to this.  Mild osa    Not enough  For mandatory intervention however  This could be causing some her or many sx such as  Foggy headed  concentration and erratic BP  Many  C/o today.  None new Plantar fasciitis stable with management of activity Thyroid stable  Total visit > 50% spent counseling and coordinating care

## 2011-03-12 ENCOUNTER — Ambulatory Visit: Payer: BC Managed Care – PPO | Admitting: Internal Medicine

## 2011-04-07 ENCOUNTER — Telehealth: Payer: Self-pay | Admitting: Internal Medicine

## 2011-04-07 NOTE — Telephone Encounter (Signed)
Left message to call back  

## 2011-04-07 NOTE — Telephone Encounter (Signed)
Pt called req call back from nurse re: getting an oxymeter to check for sleep apnea via Christoper Allegra

## 2011-04-07 NOTE — Telephone Encounter (Signed)
Pt wants this for her son.

## 2011-04-16 ENCOUNTER — Telehealth: Payer: Self-pay | Admitting: Internal Medicine

## 2011-04-16 NOTE — Telephone Encounter (Signed)
Labs are ready to pick up.

## 2011-04-16 NOTE — Telephone Encounter (Signed)
Pt requesting copy of her most resent lab work. Please contact when ready to pick up

## 2011-09-08 ENCOUNTER — Ambulatory Visit (INDEPENDENT_AMBULATORY_CARE_PROVIDER_SITE_OTHER): Payer: BC Managed Care – PPO | Admitting: Internal Medicine

## 2011-09-08 ENCOUNTER — Encounter: Payer: Self-pay | Admitting: Internal Medicine

## 2011-09-08 VITALS — BP 132/78 | HR 84 | Temp 98.6°F | Wt 240.0 lb

## 2011-09-08 DIAGNOSIS — R42 Dizziness and giddiness: Secondary | ICD-10-CM

## 2011-09-08 DIAGNOSIS — Z569 Unspecified problems related to employment: Secondary | ICD-10-CM

## 2011-09-08 DIAGNOSIS — Z823 Family history of stroke: Secondary | ICD-10-CM

## 2011-09-08 DIAGNOSIS — Z566 Other physical and mental strain related to work: Secondary | ICD-10-CM

## 2011-09-08 DIAGNOSIS — I10 Essential (primary) hypertension: Secondary | ICD-10-CM

## 2011-09-08 NOTE — Progress Notes (Signed)
  Subjective:    Patient ID: [name], female    DOB: December 19, 1955, 56 y.o.   MRN: [id number]  HPI  Patient comes in today for SDA for  problem evaluation. Here with husband  Mom had stroke recently had ht untreated concern about this.having dizziness lightheaded  Usually within  hours of taking medications  ? If related to increasing diovan ti the max dose a while back . Still with stress at work. NO cp sob neur sx .  Denies any new supplement but see list  Of supplements .  Hormone rx   Review of Systems No fever cp sob other neuro sx  Thyroid the same  Has seen cards in the past . Ms no change weight no change  Allergies no change has hx of positional vertigo this is different  Past history family history social history reviewed in the electronic medical record.     Objective:   Physical Exam BP 132/78  Pulse 84  Temp(Src) 98.6 F (37 C) (Oral)  Wt 240 lb (108.863 kg)  SpO2 98% wdwn in nad repeat GP right reg cuff  130/84 HEENT grossly normal neuro intact  Chest:  Clear to A&P without wheezes rales or rhonchi CV:  S1-S2 no gallops or murmurs peripheral perfusion is normal Abdomen:  Sof,t normal bowel sounds without hepatosplenomegaly, no guarding rebound or masses no CVA tenderness NEURO: oriented x 3 CN 3-12 appear intact. No focal muscle weakness or atrophy. DTRs symmetrical. Gait WNL.  Grossly non focal. No tremor or abnormal movement.      Assessment & Plan:  Dizziness feeling  HT  Family hx of cva  Thyroid diseas ? Others have checked uncertain who is monitoring this  Has been in range  Hx of migraine and allergy  OSA hx   Blood pressure is adequate today I suspect this could be medication reaction however she does have risk factors family history and age. We'll get carotid Dopplers and echocardiogram take blood pressure medicines at different times monitor and have her followup. Consider changing medication dosing. Caution with interactions of nonpharmaceutical's  supplements et Karie Soda.  Encouraged her to get [place] copies of any labs done elsewhere.

## 2011-09-08 NOTE — Patient Instructions (Signed)
Will order carotid Dopplers and an echocardiogram of your heart to evaluate for your dizziness and hypertension.  However it is possible that it is medication related and recommend taking one of the medications in the evening and 1 in the morning to see if the dizzy feeling abates.   If this does not help we may adjust her medication.  If you're not already doing and it is reasonable to take a baby aspirin 81 mg a day .  Or a few times a week.  Please get [place] a copy of any lab work that is done at other sites including her thyroid test.  Followup visit in one to 2 months to review your symptoms haven't the above tests.

## 2011-09-10 ENCOUNTER — Other Ambulatory Visit (HOSPITAL_COMMUNITY): Payer: BC Managed Care – PPO

## 2011-09-11 ENCOUNTER — Other Ambulatory Visit: Payer: Self-pay | Admitting: Cardiology

## 2011-09-11 DIAGNOSIS — R42 Dizziness and giddiness: Secondary | ICD-10-CM

## 2011-09-15 ENCOUNTER — Encounter (INDEPENDENT_AMBULATORY_CARE_PROVIDER_SITE_OTHER): Payer: BC Managed Care – PPO

## 2011-09-15 DIAGNOSIS — R42 Dizziness and giddiness: Secondary | ICD-10-CM

## 2011-09-18 NOTE — Progress Notes (Signed)
Quick Note:  Spoke with pt and pt is aware. ______ 

## 2011-10-20 ENCOUNTER — Ambulatory Visit (HOSPITAL_COMMUNITY): Payer: BC Managed Care – PPO | Attending: Internal Medicine | Admitting: Radiology

## 2011-10-20 DIAGNOSIS — Z823 Family history of stroke: Secondary | ICD-10-CM

## 2011-10-20 DIAGNOSIS — I517 Cardiomegaly: Secondary | ICD-10-CM | POA: Insufficient documentation

## 2011-10-20 DIAGNOSIS — I10 Essential (primary) hypertension: Secondary | ICD-10-CM

## 2011-10-20 DIAGNOSIS — R42 Dizziness and giddiness: Secondary | ICD-10-CM | POA: Insufficient documentation

## 2011-10-20 NOTE — Progress Notes (Signed)
Echocardiogram performed.  

## 2011-10-23 ENCOUNTER — Encounter: Payer: Self-pay | Admitting: Internal Medicine

## 2011-10-23 ENCOUNTER — Ambulatory Visit (INDEPENDENT_AMBULATORY_CARE_PROVIDER_SITE_OTHER): Payer: BC Managed Care – PPO | Admitting: Internal Medicine

## 2011-10-23 VITALS — BP 124/74 | HR 99 | Temp 99.0°F | Wt 240.0 lb

## 2011-10-23 DIAGNOSIS — I253 Aneurysm of heart: Secondary | ICD-10-CM

## 2011-10-23 DIAGNOSIS — H1130 Conjunctival hemorrhage, unspecified eye: Secondary | ICD-10-CM

## 2011-10-23 DIAGNOSIS — H1132 Conjunctival hemorrhage, left eye: Secondary | ICD-10-CM

## 2011-10-23 DIAGNOSIS — R42 Dizziness and giddiness: Secondary | ICD-10-CM

## 2011-10-23 DIAGNOSIS — I10 Essential (primary) hypertension: Secondary | ICD-10-CM

## 2011-10-23 HISTORY — DX: Aneurysm of heart: I25.3

## 2011-10-23 NOTE — Progress Notes (Signed)
  Subjective:    Patient ID: [name], female    DOB: Apr 14, 1956, 56 y.o.   MRN: [id number]  HPI Patient comes in today for followup of imaging studies and blood pressure management. Since her last visit she has taken the amlodipine in the morning and the Diovan about 3-4 hours later and has much less fogginess fatigue a few hours after blood pressure medication however it has not completely subsided. She has no syncope chest pain or shortness of breath that is new. She has a blood pressure log which shows 90% of her readings are in range occasionally 140s in the a.m. Yesterday she developed a sub-conjunctiva hemorrhage on the left eye without pain did have dry eyes and kidney area. No unusual bleeding otherwise she is not currently on aspirin. Review of Systems No new chest pain shortness of breath syncope swelling. Past history family history social history reviewed in the electronic medical record.  Mom recovering from stroke has carotid disease. Also dementia    Objective:   Physical Exam BP 124/74  Pulse 99  Temp 99 F (37.2 C) (Oral)  Wt 240 lb (108.863 kg) Repeat right large 134/70   Pt cuff  136/76 WDWN in nad but [place] hemorrhage left eye  Otherwise looks normal  Oriented x 3 and no noted deficits in memory, attention, and speech. Log reviewed  Echo reviewed     Assessment & Plan:  HT  Better  spitiing dosage  ? If se of med or now or can take the diovan at night.   hemorrhage  Not related to above   Expectant management. Echo  Diastolic dysfunction and  Asa and ? pfo  No hx of stroke but mom just had one  And had carotid disease. alll of above reviewd with  Pt . No alarm features but can begin asa at some point and will arrange for cardiology opinion .   Total visit > 50% spent counseling and coordinating care

## 2011-10-23 NOTE — Patient Instructions (Signed)
I  think your BP is better  Consider taking diovan at night .  Take baby asa each day for now.  After eye is better.  Will arrange  for cardiology to see you about the echo test results.

## 2011-12-31 ENCOUNTER — Ambulatory Visit (INDEPENDENT_AMBULATORY_CARE_PROVIDER_SITE_OTHER): Payer: BC Managed Care – PPO | Admitting: Cardiovascular Disease

## 2011-12-31 ENCOUNTER — Encounter: Payer: Self-pay | Admitting: Cardiovascular Disease

## 2011-12-31 VITALS — BP 141/81 | HR 87 | Ht 65.0 in | Wt 244.0 lb

## 2011-12-31 DIAGNOSIS — R0989 Other specified symptoms and signs involving the circulatory and respiratory systems: Secondary | ICD-10-CM

## 2011-12-31 DIAGNOSIS — I10 Essential (primary) hypertension: Secondary | ICD-10-CM

## 2011-12-31 DIAGNOSIS — I253 Aneurysm of heart: Secondary | ICD-10-CM

## 2011-12-31 NOTE — Patient Instructions (Addendum)
Your physician recommends that you schedule a follow-up appointment as needed.   Your physician has recommended you make the following change in your medication: START Aspirin 81mg  take one by mouth daily

## 2011-12-31 NOTE — Assessment & Plan Note (Signed)
I have personally reviewed the patient's echo images. 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 There has not definitive evidence of a PFO. Regardless of whether a PFO exists, the patient would not meet criteria for consideration of transcatheter closure is she has no personal history of TIA or stroke. There is an association between PFO and cryptogenic stroke, especially in the presence of an atrial septal aneurysm. I think this situation clearly warrants antiplatelet therapy with a daily aspirin 81 mg and I have recommended that the patient begin to take this consistently. We discussed the fact that the only way to definitively diagnose her with a PFO would be a transesophageal echocardiogram. We could consider an agitated saline study to evaluate for right-to-left shunt, regardless of the presence of the shunt, I would still recommend a daily aspirin and no other intervention at this point. Since it would not change this recommendation, I don't think there is any to pursue further studies. We had a long discussion about this issue and she is comfortable with the plan.

## 2011-12-31 NOTE — Progress Notes (Signed)
HPI:  56 year old woman presenting for initial cardiac evaluation. The patient underwent a 2-D echocardiogram to evaluate for hypertensive heart disease and strong family history of stroke. Her echocardiogram demonstrated diastolic dysfunction as well as atrial septal aneurysm and question of PFO. She was referred for further evaluation.  The patient has mild shortness of breath with activity, but she relates this to "being out of shape." She has no personal history of cardiac disease, myocardial infarction, stroke, or TIA. She denies orthopnea, PND, syncope, or chest pain.  She's had hypertension for several years. She's been treated with a combination of Diovan HCT and amlodipine. She has not felt well amlodipine and recently discontinued this on her on. She's had much less mental fogginess since discontinuing amlodipine. Home blood pressures are typically in the 130s over 70s. She has occasional readings over 140 but this is infrequent. She admits to a lot of stress at work and her blood pressures been a little bit higher since school has started again. She works as a Architectural technologist at [employer].  Outpatient Encounter Prescriptions as of 12/31/2011  Medication Sig Dispense Refill  . calcium carbonate (CALCIUM 500) 1250 MG tablet Take 1 tablet by mouth daily.        Marland Kitchen DIOVAN HCT 320-25 MG per tablet TAKE 1 TABLET BY MOUTH EVERY DAY  30 tablet  11  . fluticasone (FLONASE) 50 MCG/ACT nasal spray 2 sprays by Nasal route daily.        . MULTIPLE VITAMIN PO Take by mouth.        . Nutritional Supplements (DHEA PO) Take by mouth.      . progesterone (PROMETRIUM) 100 MG capsule Take 100 mg by mouth daily.      Marland Kitchen SYNTHROID 112 MCG tablet TAKE 1 TABLET BY MOUTH ONCE A DAY.  30 tablet  11  . aspirin EC 81 MG tablet Take 1 tablet (81 mg total) by mouth daily.  1 tablet  0  . DISCONTD: amLODipine (NORVASC) 5 MG tablet 1 BY MOUTH ONCE DAILY  30 tablet  9    Cefdinir and  Penicillins  Past Medical History  Diagnosis Date  . Depression   . GERD (gastroesophageal reflux disease)   . Hypothyroidism   . Headache     hx of migraines when younger  . Fibromyalgia   . Hypertension   . Heart palpitations     hx with normal holter event monitoring  . Hx: UTI (urinary tract infection)   . Skin cancer     basal, squamous cell    Past Surgical History  Procedure Date  . Dilation and curettage of uterus   . Tonsillectomy   . Ectopic pregnancy surgery   . Knee arthroscopy     both in past    History   Social History  . Marital Status: Married    Spouse Name: N/A    Number of Children: Y  . Years of Education: N/A   Occupational History  . TEACHER [employer]    Social History Main Topics  . Smoking status: Never Smoker   . Smokeless tobacco: Not on file  . Alcohol Use: Yes     socially (2-3 x times a year)  . Drug Use: No  . Sexually Active: Not on file   Other Topics Concern  . 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 Lives  in farm like areaJob stressesEx husband passes away    Family History  Problem Relation Age of Onset  . Hypertension Mother     low borderline  . Hypertension Father   . Liver disease Father   . Diabetes Daughter   . ADD / ADHD    . Hyperlipidemia      Maternal grandmother  . Osteoporosis    . Melanoma Sister     ROS: General: no fevers/chills/night sweats, positive for fatigue Eyes: no blurry vision, diplopia, or amaurosis ENT: no sore throat or hearing loss Resp: no cough, wheezing, or hemoptysis CV: no edema or palpitations GI: no abdominal pain, nausea, vomiting, diarrhea, or constipation GU: no dysuria, frequency, or hematuria Skin: no rash Neuro: no headache, numbness, tingling, or weakness of extremities Musculoskeletal: Positive for left knee pain Heme: no bleeding, DVT, or easy bruising Endo:  no polydipsia or polyuria  BP 141/81  Pulse 87  Ht 5\' 5"  (1.651 m)  Wt 110.678 kg (244 lb)  BMI 40.60 kg/m2  PHYSICAL EXAM: Pt is alert and oriented, WD, WN, pleasant obese woman in no distress. HEENT: normal Neck: JVP normal. Carotid upstrokes normal without bruits. No thyromegaly. Lungs: equal expansion, clear bilaterally CV: Apex is discrete and nondisplaced, RRR without murmur or gallop Abd: soft, NT, +BS, no bruit, no hepatosplenomegaly Back: no CVA tenderness Ext: no C/C/E        Femoral pulses 2+= without bruits        DP/PT pulses intact and = Skin: warm and dry without rash Neuro: CNII-XII intact             Strength intact = bilaterally  EKG:  Sinus rhythm with left anterior fascicular block and incomplete right bundle branch block, heart rate 87 beats per minute.  ECHO: Study Conclusions  - Left ventricle: The cavity size was normal. Wall thickness was normal. The estimated ejection fraction was 55%. Wall motion was normal; there were no regional wall motion abnormalities. Features are consistent with a pseudonormal left ventricular filling pattern, with concomitant abnormal relaxation and increased filling pressure (grade 2 diastolic dysfunction). - Aortic valve: There was no stenosis. - Mitral valve: Trivial regurgitation. - Left atrium: The atrium was mildly dilated. - Right ventricle: The cavity size was normal. Systolic function was normal. - Atrial septum: There was an atrial septal aneurysm. There may be a small PFO. - Pulmonary arteries: PA peak pressure: 21mm Hg (S). - Inferior vena cava: The vessel was normal in size; the respirophasic diameter changes were in the normal range (= 50%); findings are consistent with normal central venous pressure. Impressions:  - Normal LV size and systolic function, EF 55%. Moderate diastolic dysfunction. Normal RV size and systolic function. Atrial septal aneurysm with possible PFO.  ASSESSMENT AND  PLAN:

## 2011-12-31 NOTE — Assessment & Plan Note (Signed)
Blood pressures reviewed. She has reasonable control on Diovan/hydrochlorothiazide. She will not go back on amlodipine because of intolerance. She does have evidence of diastolic dysfunction by echo and we discussed the importance of weight management and aggressive treatment of hypertension as it relates to diastolic dysfunction and prevention of diastolic heart failure.

## 2012-01-01 ENCOUNTER — Other Ambulatory Visit: Payer: Self-pay | Admitting: Internal Medicine

## 2012-01-07 ENCOUNTER — Encounter: Payer: Self-pay | Admitting: Internal Medicine

## 2012-01-07 ENCOUNTER — Ambulatory Visit (INDEPENDENT_AMBULATORY_CARE_PROVIDER_SITE_OTHER): Payer: BC Managed Care – PPO | Admitting: Internal Medicine

## 2012-01-07 VITALS — BP 150/82 | HR 115 | Temp 98.3°F | Wt 246.0 lb

## 2012-01-07 DIAGNOSIS — Z566 Other physical and mental strain related to work: Secondary | ICD-10-CM

## 2012-01-07 DIAGNOSIS — R0989 Other specified symptoms and signs involving the circulatory and respiratory systems: Secondary | ICD-10-CM

## 2012-01-07 DIAGNOSIS — I10 Essential (primary) hypertension: Secondary | ICD-10-CM

## 2012-01-07 DIAGNOSIS — Z569 Unspecified problems related to employment: Secondary | ICD-10-CM

## 2012-01-07 DIAGNOSIS — I253 Aneurysm of heart: Secondary | ICD-10-CM

## 2012-01-07 DIAGNOSIS — E669 Obesity, unspecified: Secondary | ICD-10-CM

## 2012-01-07 MED ORDER — VERAPAMIL HCL ER 120 MG PO TBCR: 120.0000 mg | EXTENDED_RELEASE_TABLET | Freq: Every day | ORAL | Status: DC

## 2012-01-07 NOTE — Progress Notes (Signed)
  Subjective:    Patient ID: [name], female    DOB: 26-Mar-1956, 56 y.o.   MRN: [id number]  HPI Patient comes in for acute visit evaluation today. Or she's had this issue before and is regarding her blood pressure control. She did see the cardiologist who said she does not have a PFO but hasn't atrial septal aneurysm and to be placed on aspirin no other intervention. Was also recommended to have aggressive blood pressure control. She has gone off the amlodipine that we had her on in the past because it did cause of fatigue side effect. However she's noted that her blood pressure readings have crept up since that time although relates the mostly to her current job. When she goes on vacation or other areas her blood pressure is better. She has her machine here to review her readings.  Also of note she's had some screening laboratory studies done elsewhere does not have a copy today. She had been losing weight but now has had some come back all. It is difficult for her to exercise because of her knees and her plantar fasciitis although she feels that would be quite helpful for her.  Review of Systems Negative for current chest pain shortness of breath syncope fever rest as per hpi  Mother has had a CVA and has hypertension now in an assisted living situation. She is in her 108s.    Objective:   Physical Exam BP 150/82  Pulse 115  Temp 98.3 F (36.8 C)  Wt 246 lb (111.585 kg)  SpO2 98% Repeat blood pressure left 140/80 right 130/72 large cuff sitting  Well-developed well-nourished in no acute distress appears relaxed normal speech articulation thought  cognition. Negative CCE. Wt Readings from Last 3 Encounters:  01/07/12 246 lb (111.585 kg)  12/31/11 244 lb (110.678 kg)  10/23/11 240 lb (108.863 kg)  resp quiet unlabored   Cardiac regular rhythm no gallops or murmurs.   Assessment & Plan:  Hypertension labile history of side effect of medication amlodipine Apparently excess  response elevation on work days. But may be creeping up otherwise. Discussed options at this time we'll try per rectum L. low dose in the evening. Contact us for side effects and then follow up as in 1-2 months. Other option would be carvedilol and also beta blocker but would have to remember to take it twice a day. Obesity she had some initial weight loss the summer and has gained 6 pounds back since June. Certainly problematic for her health. Stress as a trigger. She will give [place] a copy of labs that have been done. She sees a Development worker, international aid and is on specific diet choices of foods that are generally heart healthy. She does take nutritional supplements. ASAneurysm without PFO  Total visit > 50% spent counseling and coordinating care

## 2012-01-07 NOTE — Patient Instructions (Addendum)
Intensify lifestyle interventions. Agree with weight watchers  Weight loss and BP control Add  Verapamil  Each day and call in 3-4 weeks about readings  Another options would be to try   Add on. Carvedilol as a twice a day medication .

## 2012-01-09 ENCOUNTER — Encounter: Payer: Self-pay | Admitting: Internal Medicine

## 2012-02-27 ENCOUNTER — Other Ambulatory Visit: Payer: Self-pay | Admitting: Internal Medicine

## 2012-04-03 ENCOUNTER — Other Ambulatory Visit: Payer: Self-pay | Admitting: Internal Medicine

## 2012-04-12 ENCOUNTER — Encounter: Payer: Self-pay | Admitting: Internal Medicine

## 2012-04-12 ENCOUNTER — Ambulatory Visit (INDEPENDENT_AMBULATORY_CARE_PROVIDER_SITE_OTHER): Payer: BC Managed Care – PPO | Admitting: Internal Medicine

## 2012-04-12 VITALS — BP 142/88 | HR 95 | Temp 98.5°F | Wt 228.0 lb

## 2012-04-12 DIAGNOSIS — R0989 Other specified symptoms and signs involving the circulatory and respiratory systems: Secondary | ICD-10-CM

## 2012-04-12 DIAGNOSIS — M954 Acquired deformity of chest and rib: Secondary | ICD-10-CM | POA: Insufficient documentation

## 2012-04-12 DIAGNOSIS — I10 Essential (primary) hypertension: Secondary | ICD-10-CM

## 2012-04-12 DIAGNOSIS — I253 Aneurysm of heart: Secondary | ICD-10-CM

## 2012-04-12 DIAGNOSIS — E669 Obesity, unspecified: Secondary | ICD-10-CM

## 2012-04-12 NOTE — Patient Instructions (Addendum)
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 Continue lifestyle intervention healthy eating and exercise . As you are doing. Your blood  Pressure seems to be controlled. At this   Time.  Can get ocassional  spikes . I think the healthier eating and weight loss is helping. Last labs  In the record  Were fall 2012 .  Should have fasting labs or get me other results   then plan follow up.   Or 4 - 6 months  Continue 81 mg asa.

## 2012-04-12 NOTE — Progress Notes (Signed)
Chief Complaint  Patient presents with  . Follow-up    BP    HPI: Patient comes in today for follow up of  multiple medical problems.  More particularly her HT meds   . Has been losing weight  Now on the paleo diet.  Doing better and has lost 16 pounds so far. Has bp monitor whose readings reveal mostly 120 130 and below readings some at 108 range .  No cp syncope. takine asa also  Saw dr Excell Seltzer who noted atrial septal aneurysm without obv pfo  bp control advised with asa.    Echo showed diastolic dysfunction. ROS: See pertinent positives and negatives per HPI. Check out nontender  Bump at bottom of sternum area has been feeling this since the summer and no change and no sx.  No trauma  Past Medical History  Diagnosis Date  . Depression   . GERD (gastroesophageal reflux disease)   . Hypothyroidism   . Headache     hx of migraines when younger  . Fibromyalgia   . Hypertension   . Heart palpitations     hx with normal holter event monitoring  . Hx: UTI (urinary tract infection)   . Skin cancer     basal, squamous cell    Family History  Problem Relation Age of Onset  . Hypertension Mother     low borderline  . Hypertension Father   . Liver disease Father   . Diabetes Daughter   . ADD / ADHD    . Hyperlipidemia      Maternal grandmother  . Osteoporosis    . Melanoma Sister     History   Social History  . Marital Status: Married    Spouse Name: N/A    Number of Children: Y  . Years of Education: N/A   Occupational History  . TEACHER [employer]    Social History Main Topics  . Smoking status: Never Smoker   . Smokeless tobacco: None  . Alcohol Use: Yes     Comment: socially (2-3 x times a year)  . Drug Use: No  . Sexually Active: None   Other Topics Concern  . 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 Lives in farm like areaJob stressesEx husband passes away     Outpatient Encounter Prescriptions as of 04/12/2012  Medication Sig Dispense Refill  . aspirin EC 81 MG tablet Take 1 tablet (81 mg total) by mouth daily.  1 tablet  0  . calcium carbonate (CALCIUM 500) 1250 MG tablet Take 1 tablet by mouth daily.        . fluticasone (FLONASE) 50 MCG/ACT nasal spray USE 2 SPRAYS IN EACH NOSTRIL DAILY  16 g  2  . levothyroxine (SYNTHROID, LEVOTHROID) 125 MCG tablet Take 125 mcg by mouth daily.      . MULTIPLE VITAMIN PO Take by mouth.        . progesterone (PROMETRIUM) 100 MG capsule Take 100 mg by mouth daily.      . valsartan-hydrochlorothiazide (DIOVAN-HCT) 320-25 MG per tablet TAKE 1 TABLET BY MOUTH EVERY DAY  30 tablet  0  . verapamil (CALAN-SR) 120 MG CR tablet Take 1 tablet (120 mg total) by mouth at bedtime.  30 tablet  3  . Nutritional Supplements (DHEA PO) Take by mouth.      . [DISCONTINUED] SYNTHROID 112 MCG tablet TAKE 1 TABLET BY MOUTH ONCE  A DAY.  30 tablet  11    EXAM:  BP 142/88  Pulse 95  Temp 98.5 F (36.9 C) (Oral)  Wt 228 lb (103.42 kg)  SpO2 98%  There is no height on file to calculate BMI. Wt Readings from Last 3 Encounters:  04/12/12 228 lb (103.42 kg)  01/07/12 246 lb (111.585 kg)  12/31/11 244 lb (110.678 kg)  137/78 right machine  Reg cuff size   GENERAL: vitals reviewed and listed above, alert, oriented, appears well hydrated and in no acute distress  HEENT: atraumatic, conjunctiva  clear, no obvious abnormalities on inspection of external nose and ears OP : no lesion edema or exudate   NECK: no obvious masses on inspection palpation   LUNGS: clear to auscultation bilaterally,  Area in xyphoid area is prominent bony non tender area of about  2-3 cm  irreg   No masses or tenderness Abdomen:  Sof,t normal bowel sounds without hepatosplenomegaly, no guarding rebound or masses no CVA tenderness  CV: HRRR, no clubbing cyanosis or  peripheral edema nl cap refill   MS: moves all extremities without noticeable focal   abnormality  PSYCH: pleasant and cooperative, no obvious depression or anxiety Lab Results  Component Value Date   WBC 7.3 02/06/2011   HGB 13.0 02/06/2011   HCT 39.7 02/06/2011   PLT 207.0 02/06/2011   GLUCOSE 88 02/06/2011   CHOL 227* 02/18/2010   TRIG 162.0* 02/18/2010   HDL 53.90 02/18/2010   LDLDIRECT 146.9 02/18/2010   ALT 26 05/23/2010   AST 24 05/23/2010   NA 143 02/06/2011   K 4.3 02/06/2011   CL 103 02/06/2011   CREATININE 0.86 02/06/2011   BUN 14 02/06/2011   CO2 29 02/06/2011   TSH 1.33 02/06/2011   HGBA1C 5.6 06/10/2010   bp readings reviewed  On machine and 90 % very good and at goal ASSESSMENT AND PLAN:  Discussed the following assessment and plan:  1. HYPERTENSION     better with weight loss.  due for labs says obtained elsewhere get copy to [place].  2. Labile hypertension     her own monitor seem accurate  3. OBESITY    4. Atrial septal aneurysm / if pfo  echo 6 13     5. Chest wall deformity  DG Sternum   poss prominet xyphoid process now noted after weight loss. will follow consider plain x ray  at this time doubt need for sophisticated imaging     -Patient advised to return or notify health care team  immediately if symptoms worsen or persist or new concerns arise.  Patient Instructions  Will look into any imaging study but i think the prominence you feel is the xyphoid bone. Continue lifestyle intervention healthy eating and exercise . As you are doing. Your blood  Pressure seems to be controlled. At this   Time.  Can get ocassional  spikes . I think the healthier eating and weight loss is helping. Last labs  In the record  Were fall 2012 .  Should have fasting labs or get me other results   then plan follow up.   Or 4 - 6 months  Continue 81 mg asa.    [name]. Panosh M.D.

## 2012-04-27 ENCOUNTER — Telehealth: Payer: Self-pay | Admitting: Family Medicine

## 2012-04-27 NOTE — Telephone Encounter (Signed)
Pt.notified

## 2012-04-27 NOTE — Telephone Encounter (Signed)
Message copied by Nils Flack on Tue Apr 27, 2012  4:17 PM ------      Message from: Madelin Headings      Created: Sat Apr 17, 2012  6:29 PM      Regarding: x ray       Misty             please tell patient I made order for a plain sternal xyphoid x ray  to do when convenient for her .             I think she has a prominent xyphoid bone .  If x ray is ok  And she has no pain or change in the area then would just follow her physical  exam  For now.       Thanks       [facility]

## 2012-04-28 ENCOUNTER — Encounter: Payer: Self-pay | Admitting: Gastroenterology

## 2012-04-29 ENCOUNTER — Ambulatory Visit (INDEPENDENT_AMBULATORY_CARE_PROVIDER_SITE_OTHER)
Admission: RE | Admit: 2012-04-29 | Discharge: 2012-04-29 | Disposition: A | Discharge status: Home / Self Care | Payer: BC Managed Care – PPO | Source: Ambulatory Visit | Attending: Internal Medicine | Admitting: Internal Medicine

## 2012-04-29 ENCOUNTER — Other Ambulatory Visit: Payer: Self-pay | Admitting: Internal Medicine

## 2012-04-29 DIAGNOSIS — M954 Acquired deformity of chest and rib: Secondary | ICD-10-CM

## 2012-04-30 ENCOUNTER — Telehealth: Payer: Self-pay | Admitting: Internal Medicine

## 2012-04-30 NOTE — Telephone Encounter (Signed)
Pt had xray done on 04-29-2012 requesting results. Pt is aware MD out of office until 12-23

## 2012-05-03 NOTE — Telephone Encounter (Signed)
Pt notified by telephone. 

## 2012-05-05 ENCOUNTER — Other Ambulatory Visit: Payer: Self-pay | Admitting: Internal Medicine

## 2012-05-25 ENCOUNTER — Ambulatory Visit (AMBULATORY_SURGERY_CENTER): Payer: BC Managed Care – PPO | Admitting: *Deleted

## 2012-05-25 VITALS — Ht 66.0 in | Wt 222.4 lb

## 2012-05-25 DIAGNOSIS — Z1211 Encounter for screening for malignant neoplasm of colon: Secondary | ICD-10-CM

## 2012-05-25 MED ORDER — MOVIPREP 100 G PO SOLR: ORAL | Status: DC

## 2012-05-26 ENCOUNTER — Encounter: Payer: Self-pay | Admitting: Gastroenterology

## 2012-06-02 ENCOUNTER — Telehealth: Payer: Self-pay | Admitting: Gastroenterology

## 2012-06-07 ENCOUNTER — Encounter: Payer: BC Managed Care – PPO | Admitting: Gastroenterology

## 2012-06-11 ENCOUNTER — Other Ambulatory Visit: Payer: Self-pay | Admitting: Internal Medicine

## 2012-06-22 NOTE — Telephone Encounter (Signed)
Patient cancelled the procedure will await a call back from patient

## 2012-06-22 NOTE — Telephone Encounter (Signed)
Please document if complete and close encounter 

## 2012-07-19 ENCOUNTER — Encounter: Payer: Self-pay | Admitting: Gastroenterology

## 2012-08-10 DIAGNOSIS — D126 Benign neoplasm of colon, unspecified: Secondary | ICD-10-CM

## 2012-08-10 HISTORY — DX: Benign neoplasm of colon, unspecified: D12.6

## 2012-08-20 ENCOUNTER — Ambulatory Visit (AMBULATORY_SURGERY_CENTER): Payer: BC Managed Care – PPO

## 2012-08-20 VITALS — Ht 66.0 in | Wt 218.0 lb

## 2012-08-20 DIAGNOSIS — Z1211 Encounter for screening for malignant neoplasm of colon: Secondary | ICD-10-CM

## 2012-08-20 MED ORDER — MOVIPREP 100 G PO SOLR: ORAL | Status: DC

## 2012-08-23 ENCOUNTER — Encounter: Payer: Self-pay | Admitting: Gastroenterology

## 2012-09-03 ENCOUNTER — Encounter: Payer: Self-pay | Admitting: Gastroenterology

## 2012-09-03 ENCOUNTER — Ambulatory Visit (AMBULATORY_SURGERY_CENTER): Payer: BC Managed Care – PPO | Admitting: Gastroenterology

## 2012-09-03 VITALS — BP 114/68 | HR 59 | Temp 97.7°F | Resp 15 | Ht 66.0 in | Wt 218.0 lb

## 2012-09-03 DIAGNOSIS — Z1211 Encounter for screening for malignant neoplasm of colon: Secondary | ICD-10-CM

## 2012-09-03 DIAGNOSIS — D126 Benign neoplasm of colon, unspecified: Secondary | ICD-10-CM

## 2012-09-03 MED ORDER — SODIUM CHLORIDE 0.9 % IV SOLN: 500.0000 mL | INTRAVENOUS | Status: DC

## 2012-09-03 NOTE — Patient Instructions (Addendum)

## 2012-09-03 NOTE — Op Note (Signed)
[facility] [address] [place], [address]   COLONOSCOPY PROCEDURE REPORT  PATIENT: [name], [name]  MR#: [id number] BIRTHDATE: August 11, 1955 , 56  yrs. old GENDER: Female ENDOSCOPIST: Meryl Dare, MD, [facility] PROCEDURE DATE:  09/03/2012 PROCEDURE:   Colonoscopy with snare polypectomy ASA CLASS:   Class II INDICATIONS:average risk screening. MEDICATIONS: MAC sedation, administered by CRNA and propofol (Diprivan) 270mg  IV DESCRIPTION OF PROCEDURE:   After the risks benefits and alternatives of the procedure were thoroughly explained, informed consent was obtained.  A digital rectal exam revealed no abnormalities of the rectum.   The LB [id number]  endoscope was introduced through the anus and advanced to the cecum, which was identified by both the appendix and ileocecal valve. No adverse events experienced.   The quality of the prep was good, using MoviPrep  The instrument was then slowly withdrawn as the colon was fully examined.  COLON FINDINGS: A sessile polyp measuring 1 cm in size was found in the ascending colon.  A pieciemeal polypectomy was performed using snare cautery. The resection was complete and the polyp tissue was completely retrieved. Two sessile polyps measuring 5-6 mm in size were found in the sigmoid colon. A polypectomy was performed with a cold snare. The resection was complete and the polyp tissue was completely retrieved. The colon was otherwise normal.  There was no diverticulosis, inflammation, polyps or cancers unless previously stated.  Retroflexed views revealed no abnormalities. The time to cecum=3 minutes 31 seconds.  Withdrawal time=13 minutes 50 seconds. The scope was withdrawn and the procedure completed.  COMPLICATIONS: There were no complications.  ENDOSCOPIC IMPRESSION: 1.   Sessile polyp measuring 1 cm in the ascending colon; piecemeal polypectomy performed using snare cautery 2.   Two sessile polyps measuring  5-6 mm in the sigmoid colon; polypectomy performed with a cold snare  RECOMMENDATIONS: 1.  Hold aspirin, aspirin products, and anti-inflammatory medication for 2 weeks. 2.  Await pathology results 3.  Repeat colonoscopy in 1 year if ascending colon polyp is adenomatous; 5 years if other polpys are adenomatous; otherwise 10 years   eSigned:  Meryl Dare, MD, [facility] 09/03/2012 9:04 AM

## 2012-09-03 NOTE — Progress Notes (Signed)
Report to pacu rn, vss, bbs=clear 

## 2012-09-03 NOTE — Progress Notes (Signed)
Patient did not experience any of the following events: a burn prior to discharge; a fall within the facility; wrong site/side/patient/procedure/implant event; or a hospital transfer or hospital admission upon discharge from the facility. (G8907) Patient did not have preoperative order for IV antibiotic SSI prophylaxis. (G8918)  

## 2012-09-03 NOTE — Progress Notes (Signed)
Called to room to assist during endoscopic procedure.  Patient ID and intended procedure confirmed with present staff. Received instructions for my participation in the procedure from the performing physician. ewm 

## 2012-09-06 ENCOUNTER — Telehealth: Payer: Self-pay | Admitting: *Deleted

## 2012-09-06 NOTE — Telephone Encounter (Signed)
  Follow up Call-  Call back number 09/03/2012  Post procedure Call Back phone  # [phone]  Permission to leave phone message Yes     Patient questions:  Do you have a fever, pain , or abdominal swelling? no Pain Score  0 *  Have you tolerated food without any problems? yes  Have you been able to return to your normal activities? yes  Do you have any questions about your discharge instructions: Diet   no Medications  no Follow up visit  no  Do you have questions or concerns about your Care? no  Actions: * If pain score is 4 or above: No action needed, pain <4.

## 2012-09-07 ENCOUNTER — Encounter: Payer: Self-pay | Admitting: Gastroenterology

## 2012-11-03 ENCOUNTER — Other Ambulatory Visit: Payer: Self-pay | Admitting: Internal Medicine

## 2012-11-09 ENCOUNTER — Other Ambulatory Visit: Payer: Self-pay | Admitting: Internal Medicine

## 2012-12-07 ENCOUNTER — Other Ambulatory Visit: Payer: Self-pay | Admitting: Internal Medicine

## 2012-12-08 NOTE — Telephone Encounter (Signed)
Tell pat that she is due for  Monitoring blood tests ,,, mostly BMP  Or other. Please have her arrange  Ov  Cn do lab at her visit or before.  Ok to refill med x 1 months in the interim

## 2012-12-08 NOTE — Telephone Encounter (Signed)
No lab work since 02-06-11.  Please advise. Thanks!

## 2012-12-09 ENCOUNTER — Telehealth: Payer: Self-pay | Admitting: Family Medicine

## 2012-12-09 NOTE — Telephone Encounter (Signed)
Per [facility], this patient needs to come in for OV.  BP meds filled for 1 month.  Please make appt with the pt.  Thanks!!

## 2012-12-10 NOTE — Telephone Encounter (Signed)
Scheduled

## 2012-12-17 ENCOUNTER — Other Ambulatory Visit: Payer: Self-pay | Admitting: Dermatology

## 2012-12-27 ENCOUNTER — Encounter: Payer: Self-pay | Admitting: Internal Medicine

## 2012-12-27 ENCOUNTER — Ambulatory Visit (INDEPENDENT_AMBULATORY_CARE_PROVIDER_SITE_OTHER): Payer: BC Managed Care – PPO | Admitting: Internal Medicine

## 2012-12-27 VITALS — BP 124/76 | HR 94 | Temp 97.8°F | Wt 211.0 lb

## 2012-12-27 DIAGNOSIS — R7309 Other abnormal glucose: Secondary | ICD-10-CM

## 2012-12-27 DIAGNOSIS — I10 Essential (primary) hypertension: Secondary | ICD-10-CM

## 2012-12-27 DIAGNOSIS — E039 Hypothyroidism, unspecified: Secondary | ICD-10-CM

## 2012-12-27 DIAGNOSIS — E785 Hyperlipidemia, unspecified: Secondary | ICD-10-CM

## 2012-12-27 DIAGNOSIS — E559 Vitamin D deficiency, unspecified: Secondary | ICD-10-CM

## 2012-12-27 LAB — BASIC METABOLIC PANEL
BUN: 17 mg/dL (ref 6–23)
Calcium: 9.7 mg/dL (ref 8.4–10.5)
GFR: 76.94 mL/min (ref >60.00)
Glucose, Bld: 87 mg/dL (ref 70–99)
Sodium: 139 mEq/L (ref 135–145)

## 2012-12-27 LAB — HEPATIC FUNCTION PANEL
Albumin: 4.4 g/dL (ref 3.5–5.2)
Total Bilirubin: 0.9 mg/dL (ref 0.3–1.2)

## 2012-12-27 LAB — HEMOGLOBIN A1C: Hgb A1c MFr Bld: 5.4 % (ref 4.6–6.5)

## 2012-12-27 LAB — LIPID PANEL
HDL: 61.6 mg/dL (ref >39.00)
Total CHOL/HDL Ratio: 4
Triglycerides: 100 mg/dL (ref 0.0–149.0)
VLDL: 20 mg/dL (ref 0.0–40.0)

## 2012-12-27 NOTE — Progress Notes (Signed)
Chief Complaint  Patient presents with  . Follow-up    Hypertension medicine management    HPI: Patient comes in today for follow up of  multiple medical problems.   Last visit 12 13 since that time he she's taken action to eat healthier avoid processed foods sugars and some activity in his been able to lose some weight. She feels better she has a list of her recent blood pressure monitoring most or all in range with occasional 140 or 150 but mostly in the teens and 120s. Pulses are in the normal range 70s to 80s.  Asks  if she can cut back on her blood pressure medicines.  She is under care by other physician for her thyroid and hormonal supplementation she is taking Prometrium ,DHEA and Armour thyroid.  She is on ASA for a   Atrial septal aneur? Of pfo.  Taking 10,000 units of vitamin D a day but forgets to take it is had a very low vitamin D level in the past her like that checked also  ROS: See pertinent positives and negatives per HPI. No current chest pain shortness of breath or syncope. She hit her left little toe about a month ago thought it might of been broken it was very swollen and bruised around it no deformity otherwise feels like when she broke her toe during pregnancy can walk flat-footed.  Area in her sub-xiphoid area has not changed and is nontender she thinks it might just be because she's lost weight and now she can feel it.  Past Medical History  Diagnosis Date  . Depression   . GERD (gastroesophageal reflux disease)   . Hypothyroidism   . Headache(784.0)     hx of migraines when younger  . Fibromyalgia   . Hypertension   . Heart palpitations     hx with normal holter event monitoring  . Hx: UTI (urinary tract infection)   . Skin cancer     basal, squamous cell    Family History  Problem Relation Age of Onset  . Hypertension Mother     low borderline  . Hypertension Father   . Liver disease Father   . Diabetes Daughter   . ADD / ADHD    .  Hyperlipidemia      Maternal grandmother  . Osteoporosis    . Melanoma Sister   . Colon cancer Neg Hx   . Stomach cancer Neg Hx     History   Social History  . Marital Status: Married    Spouse Name: N/A    Number of Children: Y  . Years of Education: N/A   Occupational History  . TEACHER [employer]    Social History Main Topics  . Smoking status: Never Smoker   . Smokeless tobacco: Never Used  . Alcohol Use: Yes     Comment: socially (2-3 x times a year)  . Drug Use: No  . Sexual Activity: None   Other Topics Concern  . 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 [place]   Job stresses   Ex husband passes away          Outpatient Encounter Prescriptions as of 12/27/2012  Medication Sig Dispense Refill  . aspirin EC 81 MG tablet Take 1 tablet (81 mg total) by mouth daily.  1 tablet  0  . calcium carbonate (CALCIUM 500) 1250 MG tablet Take 1 tablet by mouth daily.        . fluticasone (FLONASE) 50 MCG/ACT nasal spray USE 2 SPRAYS IN EACH NOSTRIL EVERY DAY  16 g  2  . Melatonin CR 3 MG TBCR Take by mouth at bedtime.      . MULTIPLE VITAMIN PO Take by mouth.        . Nutritional Supplements (DHEA PO) Take 7.5 mg by mouth.       . progesterone (PROMETRIUM) 100 MG capsule Take 100 mg by mouth daily.      Marland Kitchen thyroid (ARMOUR THYROID) 60 MG tablet Take 60 mg by mouth daily. With 15mg       . thyroid (ARMOUR) 15 MG tablet Take 15 mg by mouth daily. With 60mg       . valsartan-hydrochlorothiazide (DIOVAN-HCT) 320-25 MG per tablet TAKE 1 TABLET BY MOUTH EVERY DAY  30 tablet  0  . verapamil (CALAN-SR) 120 MG CR tablet TAKE 1 TABLET BY MOUTH AT BEDTIME  30 tablet  0   No facility-administered encounter medications on file as of 12/27/2012.    EXAM:  BP 124/76  Pulse 94  Temp(Src) 97.8 F (36.6 C) (Oral)  Wt 211 lb (95.709 kg)  BMI 34.07 kg/m2  SpO2  98%  Body mass index is 34.07 kg/(m^2).  GENERAL: vitals reviewed and listed above, alert, oriented, appears well hydrated and in no acute distress looks well today  HEENT: atraumatic, conjunctiva  clear, no obvious abnormalities on inspection of external nose and ears NECK: no obvious masses on inspection palpation no adenopathy or bruit LUNGS: clear to auscultation bilaterally, no wheezes, rales or rhonchi, good air movement CV: HRRR, no gallops or murmurs heard no clubbing cyanosis or  peripheral edema nl cap refill  MS: moves all extremities without noticeable focal  abnormality Abdomen:  Sof,t normal bowel sounds without hepatosplenomegaly, no guarding rebound or masses no CVA tenderness PSYCH: pleasant and cooperative, no obvious depression or anxiety Lab Results  Component Value Date   WBC 7.3 02/06/2011   HGB 13.0 02/06/2011   HCT 39.7 02/06/2011   PLT 207.0 02/06/2011   GLUCOSE 88 02/06/2011   CHOL 227* 02/18/2010   TRIG 162.0* 02/18/2010   HDL 53.90 02/18/2010   LDLDIRECT 146.9 02/18/2010   ALT 26 05/23/2010   AST 24 05/23/2010   NA 143 02/06/2011   K 4.3 02/06/2011   CL 103 02/06/2011   CREATININE 0.86 02/06/2011   BUN 14 02/06/2011   CO2 29 02/06/2011   TSH 1.33 02/06/2011   HGBA1C 5.6 06/10/2010   She states that she had laboratory studies done monitoring her thyroid most recently. No recent cholesterol or chemistry that she does up. ASSESSMENT AND PLAN:  Discussed the following assessment and plan:  HYPERTENSION - currently controlled no change yet consider dec med if weight loss conitnuing - Plan: thyroid (ARMOUR) 15 MG tablet, Basic metabolic panel, Lipid panel, Vitamin D 25 hydroxy, Hepatic function panel, Hemoglobin A1c  FASTING HYPERGLYCEMIA - check a1c prob  good with weight loss - Plan: thyroid (ARMOUR) 15 MG tablet, Basic metabolic panel, Lipid panel, Vitamin D 25 hydroxy, Hepatic function panel, Hemoglobin A1c  Dyslipidemia - Plan: thyroid (ARMOUR) 15 MG tablet,  Basic metabolic panel, Lipid panel, Vitamin D 25 hydroxy, Hepatic function panel, Hemoglobin A1c  Unspecified vitamin D deficiency - on 10000per day but not taking every day .  - Plan: thyroid (ARMOUR) 15 MG tablet,  Basic metabolic panel, Lipid panel, Vitamin D 25 hydroxy, Hepatic function panel, Hemoglobin A1c  HYPOTHYROIDISM - under other care monitored and at goal per patient Continue healthy lsi . No osa sx at present?  Avoiding processed foods and carbs.  Overall probably not a good time to stop blood pressure medication and she is now controlled. She  takes a number of supplements. Would best reduce weight and continue lifestyle intervention before stopping prescription medication for hypertension. As hypertension control has the best track record interventions to help reduce risk of stroke. -Patient advised to return or notify health care team  if symptoms worsen or persist or new concerns arise.  Patient Instructions  For  Now   Stay on same meds.    If continued weight loss .    Consider decreasing medication.  Chemistry panel today.     [name]. Panosh M.D.  Wt Readings from Last 3 Encounters:  12/27/12 211 lb (95.709 kg)  09/03/12 218 lb (98.884 kg)  08/20/12 218 lb (98.884 kg)

## 2012-12-27 NOTE — Patient Instructions (Signed)
For  Now   Stay on same meds.    If continued weight loss .    Consider decreasing medication.  Chemistry panel today.

## 2012-12-28 LAB — VITAMIN D 25 HYDROXY (VIT D DEFICIENCY, FRACTURES): Vit D, 25-Hydroxy: 68 ng/mL (ref 30–89)

## 2012-12-28 LAB — LDL CHOLESTEROL, DIRECT: Direct LDL: 165.2 mg/dL

## 2012-12-30 ENCOUNTER — Other Ambulatory Visit: Payer: Self-pay | Admitting: Family Medicine

## 2012-12-30 DIAGNOSIS — R945 Abnormal results of liver function studies: Secondary | ICD-10-CM

## 2013-01-12 ENCOUNTER — Telehealth: Payer: Self-pay | Admitting: Internal Medicine

## 2013-01-12 NOTE — Telephone Encounter (Signed)
[facility] Triage Call Report Triage Record Num: [id number] Operator: Gypsy Decant Patient Name: [name] Call Date & Time: 01/11/2013 5:20:17PM Patient Phone: [phone] PCP: [name]. Panosh Patient Gender: Female PCP Fax : [phone] Patient DOB: Aug 21, 1955 [facility] Reason for Call: Caller: Mahasin/Patient; PCP: Berniece Andreas (Family Practice); CB#: [phone]; Call regarding Migraine Headache; She is worried about taking Tylenol due to her liver enzymes have been elevated. LMP " Several years ago." Afebrile. All emergent s/sx of Headache, ruled out. Home care advice given. Patient will call in the AM If she is not feeling better. Pt feels like this is a "sinus" H/A, she questioned if she could take Sudafed. Advised her she should not since she has HTN. Advised she should take Ibuprofen and 2 adult Benadryl and rest in bed, if she does not feel better in the AM she should call us back Protocol(s) Used: Headache Recommended Outcome per Protocol: See Provider within 24 hours Reason for Outcome: Typical headache AND usual therapy is not available or is not working Care Advice: ~ Another adult should drive. ~ Avoid known causes and factors that trigger headaches. ~ Do not take aspirin for headache until discussing with your provider. ~ SYMPTOM / CONDITION MANAGEMENT ~ CAUTIONS ~ List, or take, all current prescription(s), nonprescription or alternative medication(s) to provider for evaluation. Analgesic/Antipyretic Advice - NSAIDs: Consider aspirin, ibuprofen, naproxen or ketoprofen for pain or fever as directed on label or by pharmacist/provider. PRECAUTIONS: - If over 73 years of age, should not take longer than 1 week without consulting provider. EXCEPTIONS: - Should not be used if taking blood thinners or have bleeding problems. - Do not use if have history of sensitivity/allergy to any of these medications; or history of cardiovascular,  ulcer, kidney, liver disease or diabetes unless approved by provider. - Do not exceed recommended dose or frequency. ~ 01/11/2013 5:35:02PM Page 1 of 1 [email or web address]

## 2013-01-14 ENCOUNTER — Other Ambulatory Visit: Payer: Self-pay | Admitting: Internal Medicine

## 2013-01-20 ENCOUNTER — Ambulatory Visit: Payer: BC Managed Care – PPO | Admitting: Internal Medicine

## 2013-01-26 ENCOUNTER — Other Ambulatory Visit (INDEPENDENT_AMBULATORY_CARE_PROVIDER_SITE_OTHER): Payer: BC Managed Care – PPO

## 2013-01-26 ENCOUNTER — Other Ambulatory Visit: Payer: BC Managed Care – PPO

## 2013-01-26 DIAGNOSIS — R7989 Other specified abnormal findings of blood chemistry: Secondary | ICD-10-CM

## 2013-01-26 DIAGNOSIS — R945 Abnormal results of liver function studies: Secondary | ICD-10-CM

## 2013-01-26 LAB — HEPATIC FUNCTION PANEL
Alkaline Phosphatase: 98 U/L (ref 39–117)
Bilirubin, Direct: 0.1 mg/dL (ref 0.0–0.3)

## 2013-01-31 ENCOUNTER — Telehealth: Payer: Self-pay | Admitting: Internal Medicine

## 2013-01-31 NOTE — Telephone Encounter (Signed)
Pt is calling to inquire about labs from 01/27/13. Please assist.

## 2013-02-01 NOTE — Telephone Encounter (Signed)
Pt notified that liver is normal.

## 2013-03-17 ENCOUNTER — Other Ambulatory Visit: Payer: Self-pay

## 2013-03-23 ENCOUNTER — Other Ambulatory Visit: Payer: Self-pay | Admitting: Internal Medicine

## 2013-05-02 ENCOUNTER — Other Ambulatory Visit (INDEPENDENT_AMBULATORY_CARE_PROVIDER_SITE_OTHER): Payer: BC Managed Care – PPO

## 2013-05-02 DIAGNOSIS — I10 Essential (primary) hypertension: Secondary | ICD-10-CM

## 2013-05-02 LAB — HEPATIC FUNCTION PANEL
ALT: 33 U/L (ref 0–35)
AST: 30 U/L (ref 0–37)
Albumin: 4.3 g/dL (ref 3.5–5.2)
Alkaline Phosphatase: 91 U/L (ref 39–117)
Bilirubin, Direct: 0 mg/dL (ref 0.0–0.3)

## 2013-05-09 ENCOUNTER — Encounter: Payer: Self-pay | Admitting: Family Medicine

## 2013-06-01 ENCOUNTER — Ambulatory Visit (INDEPENDENT_AMBULATORY_CARE_PROVIDER_SITE_OTHER): Payer: BC Managed Care – PPO | Admitting: Internal Medicine

## 2013-06-01 ENCOUNTER — Encounter: Payer: Self-pay | Admitting: Internal Medicine

## 2013-06-01 VITALS — BP 140/80 | HR 81 | Temp 99.5°F | Ht 65.5 in | Wt 210.0 lb

## 2013-06-01 DIAGNOSIS — R7309 Other abnormal glucose: Secondary | ICD-10-CM

## 2013-06-01 DIAGNOSIS — R42 Dizziness and giddiness: Secondary | ICD-10-CM | POA: Insufficient documentation

## 2013-06-01 DIAGNOSIS — R269 Unspecified abnormalities of gait and mobility: Secondary | ICD-10-CM

## 2013-06-01 DIAGNOSIS — J310 Chronic rhinitis: Secondary | ICD-10-CM

## 2013-06-01 DIAGNOSIS — I10 Essential (primary) hypertension: Secondary | ICD-10-CM

## 2013-06-01 DIAGNOSIS — R2689 Other abnormalities of gait and mobility: Secondary | ICD-10-CM

## 2013-06-01 DIAGNOSIS — E039 Hypothyroidism, unspecified: Secondary | ICD-10-CM

## 2013-06-01 NOTE — Progress Notes (Signed)
Chief Complaint  Patient presents with  . Dizziness    The first bad encounter was about a month ago.  Sometimes feels slightly light headed.    HPI: Patient comes in today for SDA for   problem evaluation.  Last ov was 8 14  Had been doing well but doesn't feel right over the last month . Most recently was at [employer] after eating and stood up and felt light headed and had to sit down and hold head down ;no palpitation and no vision changes .  Has been feeling a bit disease lightheaded perhaps walking funny but no acute weakness or event. Has some nasal stuffiness using her Flonase but no real sinus pain or headaches. Uses peppermint oil to treat headaches   Her Armour Thyroid has been altered about a week and half ago but she had symptoms before this. She seen a doctor in [place] to help her loose weight and adjust her thyroid medicicin   ROS: See pertinent positives and negatives per HPI. Will need shoulder surgery left  After injury at work child in [facility].  Dr [place] Poli. It is a workers comp case so no date has been set. She's not taking anti-inflammatories her pain medicines. Wants to avoid side effects. No fevers adenopathy new chest pain shortness of breath or exercise change.  She had slightly abnormal liver test and on repeat in December was normal.  Mom had a CVA in her 55s. Had high cholesterol. And hypertension.   Past Medical History  Diagnosis Date  . Depression   . GERD (gastroesophageal reflux disease)   . Hypothyroidism   . Headache(784.0)     hx of migraines when younger  . Fibromyalgia   . Hypertension   . Heart palpitations     hx with normal holter event monitoring  . Hx: UTI (urinary tract infection)   . Skin cancer     basal, squamous cell  . Atrial septal aneurysm / if pfo  echo 6 13  10/23/2011    Family History  Problem Relation Age of Onset  . Hypertension Mother     low borderline  . Hypertension Father   . Liver disease Father   . Diabetes Daughter    . ADD / ADHD    . Hyperlipidemia      Maternal grandmother  . Osteoporosis    . Melanoma Sister   . Colon cancer Neg Hx   . Stomach cancer Neg Hx     History   Social History  . Marital Status: Married    Spouse Name: N/A    Number of Children: Y  . Years of Education: N/A   Occupational History  . TEACHER [employer]    Social History Main Topics  . Smoking status: Never Smoker   . Smokeless tobacco: Never Used  . Alcohol Use: Yes     Comment: socially (2-3 x times a year)  . Drug Use: No  . Sexual Activity: None   Other Topics Concern  . 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 [place]   [place] stresses   Ex husband passes away          Outpatient Encounter Prescriptions as of 06/01/2013  Medication Sig  . fluticasone (FLONASE) 50 MCG/ACT nasal spray USE 2 SPRAYS IN [facility]  NOSTRIL EVERY DAY  . Melatonin CR 3 MG TBCR Take by mouth at bedtime.  . MULTIPLE VITAMIN PO Take by mouth.    . Nutritional Supplements (DHEA PO) Take 7.5 mg by mouth.   . progesterone (PROMETRIUM) 100 MG capsule Take 100 mg by mouth daily.  Marland Kitchen thyroid (ARMOUR) 90 MG tablet Take 90 mg by mouth daily.  . valsartan-hydrochlorothiazide (DIOVAN-HCT) 320-25 MG per tablet TAKE 1 TABLET BY MOUTH EVERY DAY  . verapamil (CALAN-SR) 120 MG CR tablet TAKE 1 TABLET BY MOUTH AT BEDTIME  . [DISCONTINUED] aspirin EC 81 MG tablet Take 1 tablet (81 mg total) by mouth daily.  . [DISCONTINUED] calcium carbonate (CALCIUM 500) 1250 MG tablet Take 1 tablet by mouth daily.    . [DISCONTINUED] thyroid (ARMOUR THYROID) 60 MG tablet Take 60 mg by mouth daily. With 15mg   . [DISCONTINUED] thyroid (ARMOUR) 15 MG tablet Take 15 mg by mouth daily. With 60mg     EXAM:  BP 140/80  Pulse 81  Temp(Src) 99.5 F (37.5 C) (Oral)  Ht 5' 5.5" (1.664 m)  Wt 210 lb (95.255 kg)  BMI 34.40 kg/m2  SpO2  98%  Body mass index is 34.4 kg/(m^2).  GENERAL: vitals reviewed and listed above, alert, oriented, appears well hydrated and in no acute distress looks well normal gait HEENT: atraumatic, conjunctiva  clear, no obvious abnormalities on inspection of external nose and ears TMs are clear knee airways minimally congested face nontender OP : no lesion edema or tongue midline exudate  NECK: no obvious masses on inspection palpation I don't hear bruits no JVD LUNGS: clear to auscultation bilaterally, no wheezes, rales or rhonchi,  CV: HRRR, no clubbing cyanosis or  peripheral edema nl cap refill pulse 84 regular no murmur heard No unusual skin changes faded rash left upper chest PSYCH: pleasant and cooperative, no obvious depression or anxiety Neurologic cranial nerves III through XII grossly intact no obvious motor weakness or abnormal reflexes. Negative Romberg EOMs are full ASSESSMENT AND PLAN:  Discussed the following assessment and plan:  Dizziness and giddiness - Plan: thyroid (ARMOUR) 90 MG tablet, Basic metabolic panel, CBC with Differential, Hemoglobin A1c, TSH, Magnesium, Sedimentation rate  Light headedness - Plan: thyroid (ARMOUR) 90 MG tablet, Basic metabolic panel, CBC with Differential, Hemoglobin A1c, TSH, Magnesium, Sedimentation rate  HYPOTHYROIDISM - Alternative medicine treatment recently adjusted them to this predated this - Plan: thyroid (ARMOUR) 90 MG tablet, Basic metabolic panel, CBC with Differential, Hemoglobin A1c, TSH, Magnesium, Sedimentation rate  HYPERTENSION - Early controlled occasionally labile - Plan: thyroid (ARMOUR) 90 MG tablet, Basic metabolic panel, CBC with Differential, Hemoglobin A1c, TSH, Magnesium, Sedimentation rate  FASTING HYPERGLYCEMIA - Plan: thyroid (ARMOUR) 90 MG tablet, Basic metabolic panel, CBC with Differential, Hemoglobin A1c, TSH, Magnesium, Sedimentation rate  Rhinitis  Imbalance Reviewed supplements none of them are new she doesn't  think it's related. -Patient advised to return or notify health care team  if symptoms worsen or persist or new concerns arise.  Patient Instructions  Uncertain cause of your symptoms.could try your Flonase twice a day and saline. In the meantime blood test to rule out metabolic aberrations. Not revealing would get a consult from neurology about your symptoms. Suppose a sinus infection to do this but is not typical. Uncertain if adjustment of your thyroid medicine could be related. Your exam is reassuring today.  Standley Brooking. Panosh M.D.  Pre visit review using our clinic review tool, if applicable. No additional management support is needed unless otherwise documented  below in the visit note.

## 2013-06-01 NOTE — Patient Instructions (Addendum)
Uncertain cause of your symptoms.could try your Flonase twice a day and saline. In the meantime blood test to rule out metabolic aberrations. Not revealing would get a consult from neurology about your symptoms. Suppose a sinus infection to do this but is not typical. Uncertain if adjustment of your thyroid medicine could be related. Your exam is reassuring today.

## 2013-06-02 LAB — CBC WITH DIFFERENTIAL/PLATELET
BASOS PCT: 0.4 % (ref 0.0–3.0)
Basophils Absolute: 0 10*3/uL (ref 0.0–0.1)
EOS PCT: 1.3 % (ref 0.0–5.0)
Eosinophils Absolute: 0.1 10*3/uL (ref 0.0–0.7)
HCT: 38.4 % (ref 36.0–46.0)
Hemoglobin: 12.8 g/dL (ref 12.0–15.0)
LYMPHS ABS: 3.6 10*3/uL (ref 0.7–4.0)
Lymphocytes Relative: 38 % (ref 12.0–46.0)
MCHC: 33.4 g/dL (ref 30.0–36.0)
MCV: 90.8 fl (ref 78.0–100.0)
MONO ABS: 0.5 10*3/uL (ref 0.1–1.0)
Monocytes Relative: 5.2 % (ref 3.0–12.0)
Neutro Abs: 5.2 10*3/uL (ref 1.4–7.7)
Neutrophils Relative %: 55.1 % (ref 43.0–77.0)
PLATELETS: 209 10*3/uL (ref 150.0–400.0)
RBC: 4.23 Mil/uL (ref 3.87–5.11)
RDW: 13.5 % (ref 11.5–14.6)
WBC: 9.5 10*3/uL (ref 4.5–10.5)

## 2013-06-02 LAB — SEDIMENTATION RATE: Sed Rate: 19 mm/hr (ref 0–22)

## 2013-06-02 LAB — HEMOGLOBIN A1C: HEMOGLOBIN A1C: 5.2 % (ref 4.6–6.5)

## 2013-06-02 LAB — BASIC METABOLIC PANEL
BUN: 21 mg/dL (ref 6–23)
CO2: 25 mEq/L (ref 19–32)
Calcium: 9.5 mg/dL (ref 8.4–10.5)
Chloride: 103 mEq/L (ref 96–112)
Creatinine, Ser: 1.2 mg/dL (ref 0.4–1.2)
GFR: 50.26 mL/min — ABNORMAL LOW (ref >60.00)
Glucose, Bld: 84 mg/dL (ref 70–99)
Potassium: 3.4 mEq/L — ABNORMAL LOW (ref 3.5–5.1)
Sodium: 138 mEq/L (ref 135–145)

## 2013-06-02 LAB — TSH: TSH: 0.26 u[IU]/mL — ABNORMAL LOW (ref 0.35–5.50)

## 2013-06-02 LAB — MAGNESIUM: MAGNESIUM: 2.1 mg/dL (ref 1.5–2.5)

## 2013-06-03 ENCOUNTER — Encounter: Payer: Self-pay | Admitting: Family Medicine

## 2013-06-06 ENCOUNTER — Ambulatory Visit (INDEPENDENT_AMBULATORY_CARE_PROVIDER_SITE_OTHER): Payer: BC Managed Care – PPO | Admitting: Neurology

## 2013-06-06 ENCOUNTER — Telehealth: Payer: Self-pay | Admitting: Neurology

## 2013-06-06 ENCOUNTER — Encounter: Payer: Self-pay | Admitting: Neurology

## 2013-06-06 ENCOUNTER — Ambulatory Visit (INDEPENDENT_AMBULATORY_CARE_PROVIDER_SITE_OTHER)
Admission: RE | Admit: 2013-06-06 | Discharge: 2013-06-06 | Disposition: A | Discharge status: Home / Self Care | Payer: BC Managed Care – PPO | Source: Ambulatory Visit | Attending: Neurology | Admitting: Neurology

## 2013-06-06 VITALS — BP 138/80 | HR 88 | Temp 98.1°F | Ht 66.0 in | Wt 210.0 lb

## 2013-06-06 DIAGNOSIS — R42 Dizziness and giddiness: Secondary | ICD-10-CM

## 2013-06-06 NOTE — Telephone Encounter (Signed)
Call patient with results of CT brain which is normal.  I also discussed that CT head was not approved by her insurance despite my peer-to-peer review and by that time we contacted the imaging center, it had already been performed. She is aware of the situation, but still very appreciative that the scan was done.  I informed her that our office will continue to look into this problem and she asked her to keep [place] informed and allow [place] to assist her however we can.  Donika K. Posey Pronto, DO

## 2013-06-06 NOTE — Progress Notes (Signed)
[facility] Clinic Note - Initial Visit   Date: 06/06/2013    [name] MRN: [id number] DOB: 17-Jun-1955   Dear Dr Regis Bill:  Thank you for your kind referral of [name] for consultation of dizziness. Although her history is well known to you, please allow [place] to reiterate it for the purpose of our medical record. The patient was accompanied to the clinic by husband who also provides collateral information.     History of Present Illness: [name] is a 58 y.o. right-handed Caucasian female with history of depression, GERD, hypothyroidism, hypertension, fibromylagia, and atrial septal aneurysm presenting for evaluation of dizziness.    She reports having vertigo two years ago.  Around December 2014, she was at [employer] and she was getting up from the table and became lightheadedness.  She had to sit back down and lay her head on the table. She felt as if she could have passed out, but did not.  She rested and waited for several minutes and within 5-minutes it has resolved.  There was no room-spinning.  No associated headaches, vision changes, numbness/tingling, weakness, loss of consciousness, or palpitations.  She has had no further spells since then.  She is planning on having left rotator cuff surgery and is concerned about potential worsening of lightheadedness or other neurological problems.  Out-side paper records, electronic medical record, and images have been reviewed where available and summarized as:  Component     Latest Ref Rng 06/01/2013  Hemoglobin A1C     4.6 - 6.5 % 5.2  TSH     0.35 - 5.50 uIU/mL 0.26 (L)  Sed Rate     0 - 22 mm/hr 19   US carotids 09/15/2011:  No significant stenosis  Past Medical History  Diagnosis Date  . Depression   . GERD (gastroesophageal reflux disease)   . Hypothyroidism   . Headache(784.0)     hx of migraines when younger  . Fibromyalgia   . Hypertension   . Heart palpitations       hx with normal holter event monitoring  . Hx: UTI (urinary tract infection)   . Skin cancer     basal, squamous cell  . Atrial septal aneurysm / if pfo  echo 6 13  10/23/2011    Past Surgical History  Procedure Laterality Date  . Dilation and curettage of uterus    . Tonsillectomy    . Ectopic pregnancy surgery    . Knee arthroscopy      both in past     Medications:  Current Outpatient Prescriptions on File Prior to Visit  Medication Sig Dispense Refill  . fluticasone (FLONASE) 50 MCG/ACT nasal spray USE 2 SPRAYS IN EACH NOSTRIL EVERY DAY  16 g  2  . Melatonin CR 3 MG TBCR Take by mouth at bedtime.      . MULTIPLE VITAMIN PO Take by mouth.        . Nutritional Supplements (DHEA PO) Take 7.5 mg by mouth.       . progesterone (PROMETRIUM) 100 MG capsule Take 100 mg by mouth daily.      Marland Kitchen thyroid (ARMOUR) 90 MG tablet Take 90 mg by mouth daily.      . valsartan-hydrochlorothiazide (DIOVAN-HCT) 320-25 MG per tablet TAKE 1 TABLET BY MOUTH EVERY DAY  30 tablet  5  . verapamil (CALAN-SR) 120 MG CR tablet TAKE 1 TABLET BY MOUTH AT BEDTIME  30 tablet  5   No current facility-administered  medications on file prior to visit.    Allergies:  Allergies  Allergen Reactions  . Norvasc [Amlodipine Besylate] Other (See Comments)    Fatigue and dizziness  . Cefdinir     REACTION: Rash, swelling and itching  . Penicillins     REACTION: swelling up as a child    Family History: Family History  Problem Relation Age of Onset  . Hypertension Mother     low borderline  . Hypertension Father   . Liver disease Father   . Diabetes Daughter   . ADD / ADHD    . Hyperlipidemia      Maternal grandmother  . Osteoporosis    . Melanoma Sister   . Colon cancer Neg Hx   . Stomach cancer Neg Hx     Social History: History   Social History  . Marital Status: Married    Spouse Name: N/A    Number of Children: Y  . Years of Education: N/A   Occupational History  . TEACHER [employer]     Social History Main Topics  . Smoking status: Never Smoker   . Smokeless tobacco: Never Used  . Alcohol Use: Yes     Comment: socially (2-3 x times a year)  . Drug Use: No  . Sexual Activity: Not on file   Other Topics Concern  . 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 [place]   Job stresses   Ex husband passes away          Review of Systems:  CONSTITUTIONAL: No fevers, chills, night sweats, or weight loss.   EYES: No visual changes or eye pain ENT: No hearing changes.  No history of nose bleeds.   RESPIRATORY: No cough, wheezing and shortness of breath.   CARDIOVASCULAR: Negative for chest pain, and palpitations.   GI: Negative for abdominal discomfort, blood in stools or black stools.  No recent change in bowel habits.   GU:  No history of incontinence.   MUSCLOSKELETAL: + history of joint pain or swelling.  No myalgias.   SKIN: Negative for lesions, rash, and itching.   HEMATOLOGY/ONCOLOGY: Negative for prolonged bleeding, bruising easily, and swollen nodes.     ENDOCRINE: Negative for cold or heat intolerance, polydipsia or goiter.   PSYCH:  +depression or anxiety symptoms.   NEURO: As Above.   Vital Signs:  BP 138/80  Pulse 88  Temp(Src) 98.1 F (36.7 C) (Oral)  Ht 5\' 6"  (1.676 m)  Wt 210 lb (95.255 kg)  BMI 33.91 kg/m2    General Medical Exam:   General:  Well appearing, comfortable.   Eyes/ENT: see cranial nerve examination.   Neck: No masses appreciated.  Full range of motion without tenderness.  No carotid bruits. Respiratory:  Clear to auscultation, good air entry bilaterally.   Cardiac:  Regular rate and rhythm, no murmur.     Extremities:  No deformities, edema, or skin discoloration.     Neurological Exam: MENTAL STATUS including orientation to time, place, person, recent and remote memory, attention span  and concentration, language, and fund of knowledge is normal.  Speech is not dysarthric.  CRANIAL NERVES: II:  No visual field defects.  Unremarkable fundi.   III-IV-VI: Pupils equal round and reactive to light.  Normal conjugate, extra-ocular eye movements in all  directions of gaze.  No nystagmus.  No ptosis  V:  Normal facial sensation.    VII:  Normal facial symmetry and movements.    VIII:  Normal hearing and vestibular function.   IX-X:  Normal palatal movement.   XI:  Normal shoulder shrug and head rotation.   XII:  Normal tongue strength and range of motion, no deviation or fasciculation.  MOTOR:  No atrophy, fasciculations or abnormal movements.  No pronator drift.  Tone is normal.    Right Upper Extremity:    Left Upper Extremity:    Deltoid  5/5   Deltoid  5/5   Biceps  5/5   Biceps  5/5   Triceps  5/5   Triceps  5/5   Wrist extensors  5/5   Wrist extensors  5/5   Wrist flexors  5/5   Wrist flexors  5/5   Finger extensors  5/5   Finger extensors  5/5   Finger flexors  5/5   Finger flexors  5/5   Dorsal interossei  5/5   Dorsal interossei  5/5   Abductor pollicis  5/5   Abductor pollicis  5/5   Tone (Ashworth scale)  0  Tone (Ashworth scale)  0   Right Lower Extremity:    Left Lower Extremity:    Hip flexors  5/5   Hip flexors  5/5   Hip extensors  5/5   Hip extensors  5/5   Knee flexors  5/5   Knee flexors  5/5   Knee extensors  5/5   Knee extensors  5/5   Dorsiflexors  5/5   Dorsiflexors  5/5   Plantarflexors  5/5   Plantarflexors  5/5   Toe extensors  5/5   Toe extensors  5/5   Toe flexors  5/5   Toe flexors  5/5   Tone (Ashworth scale)  0  Tone (Ashworth scale)  0   MSRs:  Right                                                                 Left brachioradialis 3+  brachioradialis 3+  biceps 3+  biceps 3+  triceps 3+  triceps 3+  patellar 3+  patellar 3+  ankle jerk 2+  ankle jerk 2+  Hoffman no  Hoffman no  plantar response down  plantar response down    SENSORY:  Normal and symmetric perception of light touch, pinprick, vibration, and proprioception.  Romberg's sign absent.   COORDINATION/GAIT: Normal finger-to- nose-finger and heel-to-shin.  Intact rapid alternating movements bilaterally.  Able to rise from a chair without using arms.  Gait narrow based and stable. Tandem and stressed gait intact.    IMPRESSION: Ms. Noakes is a 58 year-old female presenting for evaluation of lightheadedness.  Her neurological examination is notable for brisk and symmetric reflexes throughout and in the absence of other upper motor neuron findings, I suspect they are normal for patient.  Brisk reflexes can also be seen with excess thyroid hormone and given that her TSH is slightly suppressed, she may also be a little over treated.  Otherwise, her examination was entirely non-focal.  Based on her history and exam, she most likely had vasovagal near-syncope.  Fortunately, she has not had further spells.  Given her upcoming surgery, I would like to get an CT head to be sure I am not missing a posterior fossa abnormality.  Her US carotids from 2013 showed patent vasculature.    PLAN/RECOMMENDATIONS:  1.  CT head without contrast 2.  Reassured patient that her lightheadedness was most likely due to transient low blood pressure. Encouraged to stay well hydrated and make positional changes slowly 3.  Check orthostatic vital signs 4.  Return to clinic as needed for new or worsening symptoms.   The duration of this appointment visit was 45 minutes of face-to-face time with the patient.  Greater than 50% of this time was spent in counseling, explanation of diagnosis, planning of further management, and coordination of care.   Thank you for allowing me to participate in patient's care.  If I can answer any additional questions, I would be pleased to do so.    Sincerely,    Donika K. Posey Pronto, DO

## 2013-06-06 NOTE — Patient Instructions (Addendum)
1.  Stay well-hydrated, make positional changes slowly 2.  CT head without contrast- 3.  My office will call you with results 4.  If your symptoms worsen, please call my office for an appointment

## 2013-06-15 ENCOUNTER — Telehealth: Payer: Self-pay | Admitting: Internal Medicine

## 2013-06-15 NOTE — Telephone Encounter (Signed)
Relevant patient education assigned to patient using Emmi. ° °

## 2013-06-23 ENCOUNTER — Other Ambulatory Visit: Payer: Self-pay | Admitting: Orthopedic Surgery

## 2013-06-24 ENCOUNTER — Other Ambulatory Visit: Payer: Self-pay | Admitting: Orthopedic Surgery

## 2013-06-24 NOTE — H&P (Signed)
[name] is an 58 y.o. female.   Chief Complaint: left shoulder pain HPI: The patient is a 58 year old female who presents today for follow up of their neck. The patient is being followed for their neck and left shoulder. They are 17 week(s) out from injury. Symptoms reported today include: pain and weakness (left shoulder). and report their pain level to be mild. Current treatment includes: physical therapy (ice and heat). The patient presents today following MRI. The patient has reported symptom improvement with: activity modification and physical therapy while they have not gotten any relief of their symptoms with: Cortisone injections (some improvement of pain for only 1 week). The patient is currently working with a 10lb. lifting restriction, no overhead work, and no pushing or pulling.  [name] follows up to review MRI of the Cspine and L shoulder. She reports overall she does feel her symptoms are somewhat improved from initial onset due to PT. The steroid injection gave her partial relief but only for about a week. She feels with PT she has regained a good amount of motion and strength. She does continue to note a tender point in the left medial scapular border region as well as pain with certain movements of the shoulder. She does have hypothyroidism which is followed by Dr. Kellie Manning, she recently had a thyroid U/S with a stable nodule, about a month ago. She states she will be getting a follow up study in 6 months.  Past Medical History  Diagnosis Date  . Depression   . GERD (gastroesophageal reflux disease)   . Hypothyroidism   . Headache(784.0)     hx of migraines when younger  . Fibromyalgia   . Hypertension   . Heart palpitations     hx with normal holter event monitoring  . Hx: UTI (urinary tract infection)   . Skin cancer     basal, squamous cell  . Atrial septal aneurysm / if pfo  echo 6 13  10/23/2011    Past Surgical History  Procedure Laterality Date  .  Dilation and curettage of uterus    . Tonsillectomy    . Ectopic pregnancy surgery    . Knee arthroscopy      both in past    Family History  Problem Relation Age of Onset  . Hypertension Mother     low borderline  . Hypertension Father   . Liver disease Father   . Diabetes Daughter   . ADD / ADHD    . Hyperlipidemia      Maternal grandmother  . Osteoporosis    . Melanoma Sister   . Colon cancer Neg Hx   . Stomach cancer Neg Hx    Social History:  reports that she has never smoked. She has never used smokeless tobacco. She reports that she drinks alcohol. She reports that she does not use illicit drugs.  Allergies:  Allergies  Allergen Reactions  . Norvasc [Amlodipine Besylate] Other (See Comments)    Fatigue and dizziness  . Cefdinir     REACTION: Rash, swelling and itching  . Penicillins     REACTION: swelling up as a child     (Not in a hospital admission)  No results found for this or any previous visit (from the past 48 hour(s)). No results found.  Review of Systems  Constitutional: Negative.   HENT: Negative.   Eyes: Negative.   Respiratory: Negative.   Cardiovascular: Negative.   Gastrointestinal: Negative.   Genitourinary: Negative.  Musculoskeletal: Positive for joint pain.  Skin: Negative.   Neurological: Negative.   Psychiatric/Behavioral: Negative.     There were no vitals taken for this visit. Physical Exam  Constitutional: She is oriented to person, place, and time. She appears well-developed and well-nourished.  HENT:  Head: Normocephalic and atraumatic.  Eyes: Conjunctivae and EOM are normal. Pupils are equal, round, and reactive to light.  Neck: Normal range of motion. Neck supple.  Cardiovascular: Normal rate and regular rhythm.   Respiratory: Effort normal and breath sounds normal.  GI: Soft. Bowel sounds are normal.  Musculoskeletal:  Left Shoulder: Inspection and Palpation:Tenderness- AC joint tender to palpation (mild) and  subacromial space tender to palpation. no tenderness to palpation of the [place] joint and no tenderness to palpation of the clavicle. Swelling- none. Tissue tension/texture is - soft. Sensation- intact to light touch. Skin:Color- no ecchymosis and no erythema. ROM: Internal Rotation:AROM- within 5% of the contralateral extremity. External Rotation:AROM- full. Flexion:AROM- within 5% of the contralateral extremity. Glenohumeral Abduction:AROM- full. Strength and Tone:Biceps- 5/5. Triceps- 5/5. Abduction- 5/5 and painful. Internal Rotation- 5/5. External Rotation- 5/5. Instability- sulcus sign negative. Impingement- impingement sign positive and secondary impingement sign positive. Deformities/Malalignments/Discrepancies- no deformities noted. Special Testing- Speed's test negative. Left Elbow:Special Testing- Tinel's Sign negative over the ulnar nerve and Tinel's Sign negative over the median nerve.  Neurological: She is alert and oriented to person, place, and time. She has normal reflexes.  Skin: Skin is warm and dry.  Psychiatric: She has a normal mood and affect.     MRI L shoulder images and report reviewed by [name] with a tear of the supraspinatus, retracted 3cm. No other tears noted. Mild AC arthrosis which narrows the subacromial space.  Assessment/Plan L shoulder impingement, RCT  Pt with L sided neck and shoulder pain following a traction-type injury 17 weeks ago, while trying to stop a rolling wheelchair when her left arm was pulled across her body, some improvement in her symptoms with PT. Specifically she has ongoing shoulder pain with motion, impingement, refractory to steroid injection, with MRI significant for a retracted RCT. We discussed relevant anatomy for the cervical spine and shoulder. In regards to her neck, we discussed continued importance of proper posture, avoiding extension, continued HEP, and possible trigger point injection at the left medial  scapular border if ongoing. Would not recommend an ESI or SNRB at this point given her lack of radicular symptoms.  In regards to her left shoulder, with her ongoing pain, impingement symptoms, and retracted RCT on MRI, recommend proceeding with surgery, L shoulder mini-open SAD and RCR, possible patch graft. Discussed the procedure itself as well as risks, complications, alternatives including but not limited to DVT, PE, infx, bleeding, failure of procedure, need for secondary procedure, anesthesia risk. Discussed post-op protocols, time out of work (at least 6 weeks), PT, use of sling, restrictions and modifications. Discussed the possibility that she may not regain full ROM due to the severity of her tear. Also discussed that it will be 3-5 months post-op until she is at [place]. We also discussed the possibility of nonsurgical options, given the severity of her tear, would worry this could lead to instability, worsening tear, worsening arthritic changes, possible future need for total shoulder replacement. All her questions were answered and she desires to proceed. She will continue with her current work restrictions. Will proceed accordingly with scheduling surgery, and she will follow up 10-14 days post-op for suture removal.  I had a long discussion  with the patient concerning the risks and benefits of a rotator cuff repair, including bleeding, infection, prolonged postoperative recovery, which may require 3 to 5 months until maximum medical improvement. Overight procedure with initiation of early passive range of motion within physical therapy. Avoid any active motion for the first six weeks. This is all in an effort to avoid recurrent tear of the rotator cuff and adhesive capsulitis. Return to work without use of the arm can be obtained following two weeks. However, driving will be a challenge. We also discussed the possibility of requiring implants including bone anchors,as well as an Allograft patch graft  if a massive rotator cuff tear is encountered. Removal of any bones for spurs as well as bursitis will be performed during the procedure and also any associated anesthetic complications as well.  Left shoulder mini-open SAD, RCR, possible patch graft  Kimiyo Carmicheal M. for [name] 06/24/2013, 1:49 PM

## 2013-06-29 ENCOUNTER — Encounter (HOSPITAL_COMMUNITY): Payer: Self-pay | Admitting: Pharmacy Technician

## 2013-07-01 ENCOUNTER — Encounter: Payer: Self-pay | Admitting: Gastroenterology

## 2013-07-04 ENCOUNTER — Encounter (HOSPITAL_COMMUNITY)
Admission: RE | Admit: 2013-07-04 | Discharge: 2013-07-04 | Disposition: A | Discharge status: Home / Self Care | Payer: Worker's Compensation | Source: Ambulatory Visit | Attending: Specialist | Admitting: Specialist

## 2013-07-04 ENCOUNTER — Ambulatory Visit (HOSPITAL_COMMUNITY)
Admission: RE | Admit: 2013-07-04 | Discharge: 2013-07-04 | Disposition: A | Discharge status: Home / Self Care | Payer: Worker's Compensation | Source: Ambulatory Visit | Attending: Specialist | Admitting: Specialist

## 2013-07-04 ENCOUNTER — Encounter (HOSPITAL_COMMUNITY): Payer: Self-pay

## 2013-07-04 DIAGNOSIS — Z01818 Encounter for other preprocedural examination: Secondary | ICD-10-CM | POA: Insufficient documentation

## 2013-07-04 HISTORY — DX: Pneumonia, unspecified organism: J18.9

## 2013-07-04 HISTORY — DX: Fatty (change of) liver, not elsewhere classified: K76.0

## 2013-07-04 LAB — CBC
HCT: 41.1 % (ref 36.0–46.0)
HEMOGLOBIN: 13.9 g/dL (ref 12.0–15.0)
MCH: 30.8 pg (ref 26.0–34.0)
MCHC: 33.8 g/dL (ref 30.0–36.0)
MCV: 91.1 fL (ref 78.0–100.0)
PLATELETS: 236 10*3/uL (ref 150–400)
RBC: 4.51 MIL/uL (ref 3.87–5.11)
RDW: 13.2 % (ref 11.5–15.5)
WBC: 10.1 10*3/uL (ref 4.0–10.5)

## 2013-07-04 LAB — BASIC METABOLIC PANEL
BUN: 16 mg/dL (ref 6–23)
CALCIUM: 10.2 mg/dL (ref 8.4–10.5)
CO2: 27 mEq/L (ref 19–32)
Chloride: 99 mEq/L (ref 96–112)
Creatinine, Ser: 0.69 mg/dL (ref 0.50–1.10)
GFR calc Af Amer: >90 mL/min (ref >90)
GLUCOSE: 95 mg/dL (ref 70–99)
POTASSIUM: 4.1 meq/L (ref 3.7–5.3)
SODIUM: 139 meq/L (ref 137–147)

## 2013-07-04 NOTE — Patient Instructions (Addendum)
[place]  07/04/2013   Your procedure is scheduled on: 07/07/13   Report to [facility] at 8:00 AM.  Call this number if you have problems the morning of surgery [phone]: [phone]   Remember:   Do not eat food or drink liquids After Midnight.     Take these medicines the morning of surgery with A SIP OF WATER: thyroid   Do not wear jewelry, make-up or nail polish.  Do not wear lotions, powders, or perfumes. You may wear deodorant.  Do not shave 48 hours prior to surgery. Men may shave face and neck.  Do not bring valuables to the hospital.  Contacts, dentures or bridgework may not be worn into surgery.  Leave suitcase in the car. After surgery it may be brought to your room.  For patients admitted to the hospital, checkout time is 11:00 AM the day of discharge.   Please read over the following fact sheets that you were given:[facility] preparing for surgery sheet, incentive spirometry fact sheet Paulette Blanch, RN  pre op nurse call if needed [phone]    FAILURE TO [place]   Patient Signature: ___________________________________________

## 2013-07-07 ENCOUNTER — Encounter (HOSPITAL_COMMUNITY): Payer: Worker's Compensation | Admitting: Anesthesiology

## 2013-07-07 ENCOUNTER — Encounter (HOSPITAL_COMMUNITY): Payer: Self-pay | Admitting: *Deleted

## 2013-07-07 ENCOUNTER — Ambulatory Visit (HOSPITAL_COMMUNITY): Payer: Worker's Compensation | Admitting: Anesthesiology

## 2013-07-07 ENCOUNTER — Encounter (HOSPITAL_COMMUNITY)
Admission: RE | Disposition: A | Discharge status: Home / Self Care | Payer: Self-pay | Source: Ambulatory Visit | Attending: Specialist

## 2013-07-07 ENCOUNTER — Ambulatory Visit (HOSPITAL_COMMUNITY)
Admission: RE | Admit: 2013-07-07 | Discharge: 2013-07-09 | Disposition: A | Discharge status: Home / Self Care | Payer: Worker's Compensation | Source: Ambulatory Visit | Attending: Specialist | Admitting: Specialist

## 2013-07-07 DIAGNOSIS — F3289 Other specified depressive episodes: Secondary | ICD-10-CM | POA: Insufficient documentation

## 2013-07-07 DIAGNOSIS — I739 Peripheral vascular disease, unspecified: Secondary | ICD-10-CM | POA: Insufficient documentation

## 2013-07-07 DIAGNOSIS — E039 Hypothyroidism, unspecified: Secondary | ICD-10-CM | POA: Insufficient documentation

## 2013-07-07 DIAGNOSIS — F329 Major depressive disorder, single episode, unspecified: Secondary | ICD-10-CM | POA: Insufficient documentation

## 2013-07-07 DIAGNOSIS — E669 Obesity, unspecified: Secondary | ICD-10-CM | POA: Insufficient documentation

## 2013-07-07 DIAGNOSIS — S43429A Sprain of unspecified rotator cuff capsule, initial encounter: Secondary | ICD-10-CM | POA: Insufficient documentation

## 2013-07-07 DIAGNOSIS — G4730 Sleep apnea, unspecified: Secondary | ICD-10-CM | POA: Insufficient documentation

## 2013-07-07 DIAGNOSIS — Z85828 Personal history of other malignant neoplasm of skin: Secondary | ICD-10-CM | POA: Insufficient documentation

## 2013-07-07 DIAGNOSIS — K219 Gastro-esophageal reflux disease without esophagitis: Secondary | ICD-10-CM | POA: Insufficient documentation

## 2013-07-07 DIAGNOSIS — I10 Essential (primary) hypertension: Secondary | ICD-10-CM | POA: Insufficient documentation

## 2013-07-07 DIAGNOSIS — IMO0001 Reserved for inherently not codable concepts without codable children: Secondary | ICD-10-CM | POA: Insufficient documentation

## 2013-07-07 DIAGNOSIS — M75102 Unspecified rotator cuff tear or rupture of left shoulder, not specified as traumatic: Secondary | ICD-10-CM

## 2013-07-07 DIAGNOSIS — X58XXXA Exposure to other specified factors, initial encounter: Secondary | ICD-10-CM | POA: Insufficient documentation

## 2013-07-07 HISTORY — PX: SHOULDER OPEN ROTATOR CUFF REPAIR: SHX2407

## 2013-07-07 LAB — CBC
HEMATOCRIT: 37.5 % (ref 36.0–46.0)
Hemoglobin: 12.9 g/dL (ref 12.0–15.0)
MCH: 31.1 pg (ref 26.0–34.0)
MCHC: 34.4 g/dL (ref 30.0–36.0)
MCV: 90.4 fL (ref 78.0–100.0)
Platelets: 191 10*3/uL (ref 150–400)
RBC: 4.15 MIL/uL (ref 3.87–5.11)
RDW: 13 % (ref 11.5–15.5)
WBC: 14.2 10*3/uL — AB (ref 4.0–10.5)

## 2013-07-07 LAB — CREATININE, SERUM: CREATININE: 0.65 mg/dL (ref 0.50–1.10)

## 2013-07-07 SURGERY — REPAIR, ROTATOR CUFF, OPEN
Anesthesia: Regional | Site: Shoulder | Laterality: Left

## 2013-07-07 MED ORDER — CLINDAMYCIN PHOSPHATE 900 MG/50ML IV SOLN
INTRAVENOUS | Status: AC
  Filled 2013-07-07: qty 50

## 2013-07-07 MED ORDER — METOCLOPRAMIDE HCL 5 MG/ML IJ SOLN: 5.0000 mg | Freq: Three times a day (TID) | INTRAMUSCULAR | Status: DC | PRN

## 2013-07-07 MED ORDER — CLINDAMYCIN PHOSPHATE 900 MG/50ML IV SOLN
900.0000 mg | INTRAVENOUS | Status: AC
  Administered 2013-07-07: 900 mg via INTRAVENOUS
  Filled 2013-07-07: qty 50

## 2013-07-07 MED ORDER — PHENOL 1.4 % MT LIQD: 1.0000 | OROMUCOSAL | Status: DC | PRN

## 2013-07-07 MED ORDER — VITAMIN C 500 MG PO TABS
1000.0000 mg | ORAL_TABLET | Freq: Every day | ORAL | Status: DC
  Administered 2013-07-07 – 2013-07-09 (×3): 1000 mg via ORAL
  Filled 2013-07-07 (×3): qty 2

## 2013-07-07 MED ORDER — ENOXAPARIN SODIUM 40 MG/0.4ML ~~LOC~~ SOLN
40.0000 mg | SUBCUTANEOUS | Status: DC
  Administered 2013-07-08 – 2013-07-09 (×2): 40 mg via SUBCUTANEOUS
  Filled 2013-07-07 (×3): qty 0.4

## 2013-07-07 MED ORDER — BUPIVACAINE-EPINEPHRINE PF 0.5-1:200000 % IJ SOLN
INTRAMUSCULAR | Status: AC
  Filled 2013-07-07: qty 30

## 2013-07-07 MED ORDER — POLYVINYL ALCOHOL 1.4 % OP SOLN
1.0000 [drp] | OPHTHALMIC | Status: DC | PRN
  Filled 2013-07-07: qty 15

## 2013-07-07 MED ORDER — MEPERIDINE HCL 50 MG/ML IJ SOLN: 6.2500 mg | INTRAMUSCULAR | Status: DC | PRN

## 2013-07-07 MED ORDER — OXYCODONE HCL 5 MG/5ML PO SOLN
5.0000 mg | Freq: Once | ORAL | Status: DC | PRN
  Filled 2013-07-07: qty 5

## 2013-07-07 MED ORDER — PHENYLEPHRINE HCL 10 MG/ML IJ SOLN
INTRAMUSCULAR | Status: AC
  Filled 2013-07-07: qty 1

## 2013-07-07 MED ORDER — PHENYLEPHRINE 40 MCG/ML (10ML) SYRINGE FOR IV PUSH (FOR BLOOD PRESSURE SUPPORT)
PREFILLED_SYRINGE | INTRAVENOUS | Status: AC
  Filled 2013-07-07: qty 10

## 2013-07-07 MED ORDER — OXYCODONE HCL 5 MG PO TABS: 5.0000 mg | ORAL_TABLET | Freq: Once | ORAL | Status: DC | PRN

## 2013-07-07 MED ORDER — METOCLOPRAMIDE HCL 10 MG PO TABS: 5.0000 mg | ORAL_TABLET | Freq: Three times a day (TID) | ORAL | Status: DC | PRN

## 2013-07-07 MED ORDER — DOCUSATE SODIUM 100 MG PO CAPS
100.0000 mg | ORAL_CAPSULE | Freq: Two times a day (BID) | ORAL | Status: DC
  Administered 2013-07-07 – 2013-07-09 (×4): 100 mg via ORAL

## 2013-07-07 MED ORDER — PROMETHAZINE HCL 25 MG/ML IJ SOLN: 6.2500 mg | INTRAMUSCULAR | Status: DC | PRN

## 2013-07-07 MED ORDER — FENTANYL CITRATE 0.05 MG/ML IJ SOLN
INTRAMUSCULAR | Status: DC | PRN
  Administered 2013-07-07: 50 ug via INTRAVENOUS

## 2013-07-07 MED ORDER — MIDAZOLAM HCL 2 MG/2ML IJ SOLN
INTRAMUSCULAR | Status: AC
  Filled 2013-07-07: qty 2

## 2013-07-07 MED ORDER — VITAMIN D3 250 MCG (10000 UT) PO CAPS
10000.0000 [IU] | ORAL_CAPSULE | Freq: Every day | ORAL | Status: DC
Start: 2013-07-07 — End: 2013-07-07

## 2013-07-07 MED ORDER — ADULT MULTIVITAMIN W/MINERALS CH
1.0000 | ORAL_TABLET | Freq: Every day | ORAL | Status: DC
  Administered 2013-07-07 – 2013-07-09 (×3): 1 via ORAL
  Filled 2013-07-07 (×3): qty 1

## 2013-07-07 MED ORDER — PHENYLEPHRINE HCL 10 MG/ML IJ SOLN
INTRAMUSCULAR | Status: DC | PRN
  Administered 2013-07-07 (×3): 80 ug via INTRAVENOUS

## 2013-07-07 MED ORDER — HYDROCHLOROTHIAZIDE 25 MG PO TABS
25.0000 mg | ORAL_TABLET | Freq: Every day | ORAL | Status: DC
  Administered 2013-07-07 – 2013-07-09 (×2): 25 mg via ORAL
  Filled 2013-07-07 (×3): qty 1

## 2013-07-07 MED ORDER — GLYCOPYRROLATE 0.2 MG/ML IJ SOLN
INTRAMUSCULAR | Status: AC
  Filled 2013-07-07: qty 3

## 2013-07-07 MED ORDER — FENTANYL CITRATE 0.05 MG/ML IJ SOLN
INTRAMUSCULAR | Status: AC
  Filled 2013-07-07: qty 5

## 2013-07-07 MED ORDER — ROCURONIUM BROMIDE 100 MG/10ML IV SOLN
INTRAVENOUS | Status: AC
  Filled 2013-07-07: qty 1

## 2013-07-07 MED ORDER — THYROID 60 MG PO TABS
90.0000 mg | ORAL_TABLET | Freq: Every morning | ORAL | Status: DC
  Administered 2013-07-08 – 2013-07-09 (×2): 90 mg via ORAL
  Filled 2013-07-07 (×2): qty 1

## 2013-07-07 MED ORDER — VITAMIN D3 25 MCG (1000 UNIT) PO TABS
1000.0000 [IU] | ORAL_TABLET | Freq: Every day | ORAL | Status: DC
  Administered 2013-07-08 – 2013-07-09 (×2): 1000 [IU] via ORAL
  Filled 2013-07-07 (×2): qty 1

## 2013-07-07 MED ORDER — ACETAMINOPHEN 325 MG PO TABS
650.0000 mg | ORAL_TABLET | Freq: Four times a day (QID) | ORAL | Status: DC | PRN
Start: 2013-07-07 — End: 2013-07-09

## 2013-07-07 MED ORDER — METHOCARBAMOL 500 MG PO TABS: 500.0000 mg | ORAL_TABLET | Freq: Three times a day (TID) | ORAL | Status: DC | PRN

## 2013-07-07 MED ORDER — HYDROMORPHONE HCL PF 1 MG/ML IJ SOLN: 0.2500 mg | INTRAMUSCULAR | Status: DC | PRN

## 2013-07-07 MED ORDER — BUPIVACAINE-EPINEPHRINE 0.5% -1:200000 IJ SOLN
INTRAMUSCULAR | Status: DC | PRN
  Administered 2013-07-07: 10 mL

## 2013-07-07 MED ORDER — DOCUSATE SODIUM 100 MG PO CAPS: 100.0000 mg | ORAL_CAPSULE | Freq: Two times a day (BID) | ORAL | Status: DC | PRN

## 2013-07-07 MED ORDER — RISAQUAD PO CAPS
2.0000 | ORAL_CAPSULE | Freq: Every day | ORAL | Status: DC
  Administered 2013-07-07 – 2013-07-09 (×3): 2 via ORAL
  Filled 2013-07-07 (×3): qty 2

## 2013-07-07 MED ORDER — OXYCODONE HCL 5 MG PO TABS: ORAL_TABLET | ORAL | Status: DC

## 2013-07-07 MED ORDER — NEOSTIGMINE METHYLSULFATE 1 MG/ML IJ SOLN
INTRAMUSCULAR | Status: AC
  Filled 2013-07-07: qty 10

## 2013-07-07 MED ORDER — HYDROMORPHONE HCL PF 1 MG/ML IJ SOLN: 0.5000 mg | INTRAMUSCULAR | Status: DC | PRN

## 2013-07-07 MED ORDER — SODIUM CHLORIDE 0.9 % IV SOLN
10.0000 mg | INTRAVENOUS | Status: DC | PRN
  Administered 2013-07-07: 50 ug/min via INTRAVENOUS

## 2013-07-07 MED ORDER — FENTANYL CITRATE 0.05 MG/ML IJ SOLN
50.0000 ug | INTRAMUSCULAR | Status: DC | PRN
  Administered 2013-07-07: 50 ug via INTRAVENOUS

## 2013-07-07 MED ORDER — ONDANSETRON HCL 4 MG/2ML IJ SOLN
INTRAMUSCULAR | Status: DC | PRN
  Administered 2013-07-07: 4 mg via INTRAVENOUS

## 2013-07-07 MED ORDER — ONDANSETRON HCL 4 MG/2ML IJ SOLN
4.0000 mg | Freq: Four times a day (QID) | INTRAMUSCULAR | Status: DC | PRN
  Administered 2013-07-08: 4 mg via INTRAVENOUS
  Filled 2013-07-07: qty 2

## 2013-07-07 MED ORDER — MIDAZOLAM HCL 10 MG/2ML IJ SOLN
1.0000 mg | INTRAMUSCULAR | Status: DC | PRN
Start: 2013-07-07 — End: 2013-07-07
  Administered 2013-07-07: 2 mg via INTRAVENOUS

## 2013-07-07 MED ORDER — CLINDAMYCIN PHOSPHATE 900 MG/50ML IV SOLN
900.0000 mg | Freq: Four times a day (QID) | INTRAVENOUS | Status: AC
  Administered 2013-07-07 – 2013-07-08 (×3): 900 mg via INTRAVENOUS
  Filled 2013-07-07 (×3): qty 50

## 2013-07-07 MED ORDER — PROPOFOL 10 MG/ML IV BOLUS
INTRAVENOUS | Status: DC | PRN
  Administered 2013-07-07: 140 mg via INTRAVENOUS

## 2013-07-07 MED ORDER — ACETAMINOPHEN 650 MG RE SUPP: 650.0000 mg | Freq: Four times a day (QID) | RECTAL | Status: DC | PRN

## 2013-07-07 MED ORDER — VERAPAMIL HCL ER 120 MG PO CP24: 120.0000 mg | ORAL_CAPSULE | Freq: Every day | ORAL | Status: DC

## 2013-07-07 MED ORDER — IRBESARTAN 300 MG PO TABS
300.0000 mg | ORAL_TABLET | Freq: Every day | ORAL | Status: DC
  Administered 2013-07-07 – 2013-07-09 (×2): 300 mg via ORAL
  Filled 2013-07-07 (×3): qty 1

## 2013-07-07 MED ORDER — ONDANSETRON HCL 4 MG/2ML IJ SOLN
INTRAMUSCULAR | Status: AC
  Filled 2013-07-07: qty 2

## 2013-07-07 MED ORDER — HYDROCODONE-ACETAMINOPHEN 5-325 MG PO TABS
1.0000 | ORAL_TABLET | ORAL | Status: DC | PRN
  Administered 2013-07-08: 1 via ORAL
  Filled 2013-07-07: qty 1

## 2013-07-07 MED ORDER — DEXAMETHASONE SODIUM PHOSPHATE 10 MG/ML IJ SOLN
INTRAMUSCULAR | Status: AC
  Filled 2013-07-07: qty 1

## 2013-07-07 MED ORDER — DEXAMETHASONE SODIUM PHOSPHATE 10 MG/ML IJ SOLN
INTRAMUSCULAR | Status: DC | PRN
  Administered 2013-07-07: 10 mg via INTRAVENOUS

## 2013-07-07 MED ORDER — ONDANSETRON HCL 4 MG PO TABS: 4.0000 mg | ORAL_TABLET | Freq: Four times a day (QID) | ORAL | Status: DC | PRN

## 2013-07-07 MED ORDER — SODIUM CHLORIDE 0.9 % IR SOLN
Status: DC | PRN
  Administered 2013-07-07: 11:00:00

## 2013-07-07 MED ORDER — ROPIVACAINE HCL 5 MG/ML IJ SOLN
INTRAMUSCULAR | Status: AC
  Filled 2013-07-07: qty 30

## 2013-07-07 MED ORDER — PROPOFOL 10 MG/ML IV BOLUS
INTRAVENOUS | Status: AC
  Filled 2013-07-07: qty 20

## 2013-07-07 MED ORDER — METHOCARBAMOL 500 MG PO TABS: 500.0000 mg | ORAL_TABLET | Freq: Four times a day (QID) | ORAL | Status: DC | PRN

## 2013-07-07 MED ORDER — LACTATED RINGERS IV SOLN
INTRAVENOUS | Status: DC
  Administered 2013-07-07 (×2): via INTRAVENOUS

## 2013-07-07 MED ORDER — OXYCODONE HCL 5 MG PO TABS
5.0000 mg | ORAL_TABLET | ORAL | Status: DC | PRN
  Administered 2013-07-08: 10 mg via ORAL
  Administered 2013-07-08: 5 mg via ORAL
  Administered 2013-07-08 (×4): 10 mg via ORAL
  Administered 2013-07-08: 5 mg via ORAL
  Administered 2013-07-08 – 2013-07-09 (×8): 10 mg via ORAL
  Filled 2013-07-07 (×6): qty 2
  Filled 2013-07-07: qty 1
  Filled 2013-07-07 (×5): qty 2
  Filled 2013-07-07: qty 1
  Filled 2013-07-07 (×2): qty 2

## 2013-07-07 MED ORDER — DEXTROSE 5 % IV SOLN
500.0000 mg | Freq: Four times a day (QID) | INTRAVENOUS | Status: DC | PRN
  Administered 2013-07-07: 500 mg via INTRAVENOUS
  Filled 2013-07-07: qty 5

## 2013-07-07 MED ORDER — UBIQUINOL 100 MG PO CAPS: 1.0000 | ORAL_CAPSULE | Freq: Every day | ORAL | Status: DC

## 2013-07-07 MED ORDER — VERAPAMIL HCL ER 120 MG PO TBCR
120.0000 mg | EXTENDED_RELEASE_TABLET | Freq: Every day | ORAL | Status: DC
  Administered 2013-07-07 – 2013-07-08 (×2): 120 mg via ORAL
  Filled 2013-07-07 (×3): qty 1

## 2013-07-07 MED ORDER — VALSARTAN-HYDROCHLOROTHIAZIDE 320-25 MG PO TABS: 1.0000 | ORAL_TABLET | Freq: Every morning | ORAL | Status: DC

## 2013-07-07 MED ORDER — SUCCINYLCHOLINE CHLORIDE 20 MG/ML IJ SOLN
INTRAMUSCULAR | Status: DC | PRN
  Administered 2013-07-07: 100 mg via INTRAVENOUS

## 2013-07-07 MED ORDER — FENTANYL CITRATE 0.05 MG/ML IJ SOLN
INTRAMUSCULAR | Status: AC
  Filled 2013-07-07: qty 2

## 2013-07-07 MED ORDER — MENTHOL 3 MG MT LOZG: 1.0000 | LOZENGE | OROMUCOSAL | Status: DC | PRN

## 2013-07-07 MED ORDER — SODIUM CHLORIDE 0.45 % IV SOLN
INTRAVENOUS | Status: AC
  Administered 2013-07-07: 18:00:00 via INTRAVENOUS

## 2013-07-07 MED ORDER — ROPIVACAINE HCL 5 MG/ML IJ SOLN
INTRAMUSCULAR | Status: DC | PRN
  Administered 2013-07-07: 30 mL via PERINEURAL

## 2013-07-07 MED ORDER — PROGESTERONE MICRONIZED 100 MG PO CAPS
100.0000 mg | ORAL_CAPSULE | Freq: Every day | ORAL | Status: DC
  Administered 2013-07-08 – 2013-07-09 (×2): 100 mg via ORAL
  Filled 2013-07-07 (×2): qty 1

## 2013-07-07 SURGICAL SUPPLY — 44 items
ANCH SUT PUSHLCK 24X4.5 STRL (Orthopedic Implant) ×2 IMPLANT
ANCH SUT SWLK 19.1X4.75 VT (Anchor) ×2 IMPLANT
ANCHOR NDL 9/16 CIR SZ 8 (NEEDLE) IMPLANT
ANCHOR NEEDLE 9/16 CIR SZ 8 (NEEDLE) ×2 IMPLANT
ANCHOR PEEK 4.75X19.1 SWLK C (Anchor) ×2 IMPLANT
BAG SPEC THK2 15X12 ZIP CLS (MISCELLANEOUS)
BAG ZIPLOCK 12X15 (MISCELLANEOUS) IMPLANT
CLEANER TIP ELECTROSURG 2X2 (MISCELLANEOUS) ×2 IMPLANT
CLOTH 2% CHLOROHEXIDINE 3PK (PERSONAL CARE ITEMS) ×2 IMPLANT
DECANTER SPIKE VIAL GLASS SM (MISCELLANEOUS) IMPLANT
DRAPE ORTHO SPLIT 77X108 STRL (DRAPES) ×4
DRAPE POUCH INSTRU U-SHP 10X18 (DRAPES) ×2 IMPLANT
DRAPE SURG ORHT 6 SPLT 77X108 (DRAPES) ×1 IMPLANT
DRSG AQUACEL AG ADV 3.5X 4 (GAUZE/BANDAGES/DRESSINGS) ×1 IMPLANT
DRSG AQUACEL AG ADV 3.5X 6 (GAUZE/BANDAGES/DRESSINGS) IMPLANT
DURAPREP 26ML APPLICATOR (WOUND CARE) ×2 IMPLANT
ELECT NEEDLE TIP 2.8 STRL (NEEDLE) ×2 IMPLANT
ELECT REM PT RETURN 9FT ADLT (ELECTROSURGICAL) ×2
ELECTRODE REM PT RTRN 9FT ADLT (ELECTROSURGICAL) ×1 IMPLANT
GLOVE BIOGEL PI IND STRL 7.5 (GLOVE) ×1 IMPLANT
GLOVE BIOGEL PI IND STRL 8 (GLOVE) IMPLANT
GLOVE BIOGEL PI INDICATOR 7.5 (GLOVE) ×1
GLOVE BIOGEL PI INDICATOR 8 (GLOVE)
GLOVE SURG SS PI 7.5 STRL IVOR (GLOVE) ×2 IMPLANT
GLOVE SURG SS PI 8.0 STRL IVOR (GLOVE) ×4 IMPLANT
GOWN STRL REUS W/TWL XL LVL3 (GOWN DISPOSABLE) ×4 IMPLANT
KIT BASIN OR (CUSTOM PROCEDURE TRAY) ×2 IMPLANT
KIT POSITION SHOULDER SCHLEI (MISCELLANEOUS) ×2 IMPLANT
MANIFOLD NEPTUNE II (INSTRUMENTS) ×2 IMPLANT
NDL SCORPION MULTI FIRE (NEEDLE) IMPLANT
NEEDLE SCORPION MULTI FIRE (NEEDLE) ×2 IMPLANT
PACK SHOULDER CUSTOM OPM052 (CUSTOM PROCEDURE TRAY) ×2 IMPLANT
POSITIONER SURGICAL ARM (MISCELLANEOUS) ×2 IMPLANT
PUSHLOCK PEEK 4.5X24 (Orthopedic Implant) ×2 IMPLANT
SLING ARM IMMOBILIZER LRG (SOFTGOODS) IMPLANT
SLING ULTRA II L (ORTHOPEDIC SUPPLIES) ×1 IMPLANT
STRIP CLOSURE SKIN 1/2X4 (GAUZE/BANDAGES/DRESSINGS) ×2 IMPLANT
SUT BONE WAX W31G (SUTURE) IMPLANT
SUT ETHIBOND NAB CT1 #1 30IN (SUTURE) IMPLANT
SUT PROLENE 3 0 PS 2 (SUTURE) ×2 IMPLANT
SUT TIGER TAPE 7 IN WHITE (SUTURE) ×1 IMPLANT
SUT VIC AB 1-0 CT2 27 (SUTURE) ×2 IMPLANT
SUT VIC AB 2-0 CT2 27 (SUTURE) ×2 IMPLANT
TAPE FIBER 2MM 7IN #2 BLUE (SUTURE) ×2 IMPLANT

## 2013-07-07 NOTE — Brief Op Note (Signed)
07/07/2013  11:53 AM  PATIENT:  [name]  58 y.o. 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   PROCEDURE:  Procedure(s) with comments: LEFT SHOULDER MINI OPEN SUBACROMIAL DECOMPRESSION ROTATOR CUFF REPAIR AND POSSIBLE PATCH GRAFT  (Left) - with interscaline block  SURGEON:  Surgeon(s) and Role:    * Johnn Hai, MD - Primary  PHYSICIAN ASSISTANT:   ASSISTANTS: [place]   ANESTHESIA:   general  EBL:  Total I/O In: 1000 [I.V.:1000] Out: -   BLOOD ADMINISTERED:none  DRAINS: none   LOCAL MEDICATIONS USED:  MARCAINE     SPECIMEN:  No Specimen  DISPOSITION OF SPECIMEN:  N/A  COUNTS:  YES  TOURNIQUET:  * No tourniquets in log *  DICTATION: .Other Dictation: Dictation Number [id number]  PLAN OF CARE: Admit for overnight observation  PATIENT DISPOSITION:  PACU - hemodynamically stable.   Delay start of Pharmacological VTE agent (>24hrs) due to surgical blood loss or risk of bleeding: no

## 2013-07-07 NOTE — Anesthesia Preprocedure Evaluation (Addendum)
Anesthesia Evaluation  Patient identified by MRN, date of birth, ID band Patient awake    Reviewed: Allergy & Precautions, H&P , NPO status , Patient's Chart, lab work & pertinent test results  Airway Mallampati: II TM Distance: >3 FB Neck ROM: Full    Dental  (+) Dental Advisory Given   Pulmonary sleep apnea , pneumonia -, resolved,  breath sounds clear to auscultation        Cardiovascular hypertension, Pt. on medications + Peripheral Vascular Disease Rhythm:Regular Rate:Normal     Neuro/Psych  Headaches, PSYCHIATRIC DISORDERS Depression    GI/Hepatic Neg liver ROS, GERD-  ,  Endo/Other  Hypothyroidism   Renal/GU negative Renal ROS     Musculoskeletal  (+) Fibromyalgia -  Abdominal (+) + obese,   Peds  Hematology negative hematology ROS (+)   Anesthesia Other Findings   Reproductive/Obstetrics negative OB ROS                        Anesthesia Physical Anesthesia Plan  ASA: III  Anesthesia Plan: General and Regional   Post-op Pain Management:    Induction: Intravenous  Airway Management Planned: Oral ETT  Additional Equipment:   Intra-op Plan:   Post-operative Plan: Extubation in OR  Informed Consent: I have reviewed the patients History and Physical, chart, labs and discussed the procedure including the risks, benefits and alternatives for the proposed anesthesia with the patient or authorized representative who has indicated his/her understanding and acceptance.   Dental advisory given  Plan Discussed with: CRNA  Anesthesia Plan Comments:       Anesthesia Quick Evaluation

## 2013-07-07 NOTE — Interval H&P Note (Signed)
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 After consideration of risks, benefits and other options for treatment, the patient has consented to  Procedure(s): LEFT SHOULDER MINI OPEN SUBACROMIAL DECOMPRESSION ROTATOR CUFF REPAIR AND POSSIBLE PATCH GRAFT  (Left) as a surgical intervention .  The patient's history has been reviewed, patient examined, no change in status, stable for surgery.  I have reviewed the patient's chart and labs.  Questions were answered to the patient's satisfaction.     [place] [facility]

## 2013-07-07 NOTE — Transfer of Care (Signed)
Immediate Anesthesia Transfer of Care Note  Patient: [name]  Procedure(s) Performed: Procedure(s) with comments: LEFT SHOULDER MINI OPEN SUBACROMIAL DECOMPRESSION ROTATOR CUFF REPAIR AND POSSIBLE PATCH GRAFT  (Left) - with interscaline block  Patient Location: PACU  Anesthesia Type:General, Regional and GA combined with regional for post-op pain  Level of Consciousness: awake, oriented and patient cooperative  Airway & Oxygen Therapy: Patient Spontanous Breathing and Patient connected to face mask oxygen  Post-op Assessment: Report given to PACU RN and Post -op Vital signs reviewed and stable  Post vital signs: Reviewed and stable  Complications: No apparent anesthesia complications

## 2013-07-07 NOTE — Plan of Care (Signed)
Problem: Diagnosis - Type of Surgery Goal: General Surgical Patient Education (See Patient Education module for education specifics) Left rotator cuff

## 2013-07-07 NOTE — Anesthesia Procedure Notes (Signed)
Anesthesia Regional Block:  Interscalene brachial plexus block  Pre-Anesthetic Checklist: ,, timeout performed, Correct Patient, Correct Site, Correct Laterality, Correct Procedure, Correct Position, site marked, Risks and benefits discussed,  Surgical consent,  Pre-op evaluation,  At surgeon's request and post-op pain management  Laterality: Left  Prep: chloraprep       Needles:  Injection technique: Single-shot  Needle Type: Stimiplex     Needle Length: 9cm 9 cm Needle Gauge: 21 and 21 G    Additional Needles:  Procedures: ultrasound guided (picture in chart) and nerve stimulator Interscalene brachial plexus block  Nerve Stimulator or Paresthesia:  Response: Deltoid, 0.5 mA,   Additional Responses:   Narrative:  Start time: 07/07/2013 10:05 AM End time: 07/07/2013 10:15 AM Injection made incrementally with aspirations every 5 mL.  Performed by: Personally  Anesthesiologist: Maicol Bowland  Additional Notes: Patient tolerated the procedure well without complications

## 2013-07-07 NOTE — H&P (View-Only) (Signed)
[name] is an 58 y.o. female.   Chief Complaint: left shoulder pain HPI: The patient is a 58 year old female who presents today for follow up of their neck. The patient is being followed for their neck and left shoulder. They are 17 week(s) out from injury. Symptoms reported today include: pain and weakness (left shoulder). and report their pain level to be mild. Current treatment includes: physical therapy (ice and heat). The patient presents today following MRI. The patient has reported symptom improvement with: activity modification and physical therapy while they have not gotten any relief of their symptoms with: Cortisone injections (some improvement of pain for only 1 week). The patient is currently working with a 10lb. lifting restriction, no overhead work, and no pushing or pulling.  [name] follows up to review MRI of the Cspine and L shoulder. She reports overall she does feel her symptoms are somewhat improved from initial onset due to PT. The steroid injection gave her partial relief but only for about a week. She feels with PT she has regained a good amount of motion and strength. She does continue to note a tender point in the left medial scapular border region as well as pain with certain movements of the shoulder. She does have hypothyroidism which is followed by Dr. Kellie Simmering, she recently had a thyroid U/S with a stable nodule, about a month ago. She states she will be getting a follow up study in 6 months.  Past Medical History  Diagnosis Date  . Depression   . GERD (gastroesophageal reflux disease)   . Hypothyroidism   . Headache(784.0)     hx of migraines when younger  . Fibromyalgia   . Hypertension   . Heart palpitations     hx with normal holter event monitoring  . Hx: UTI (urinary tract infection)   . Skin cancer     basal, squamous cell  . Atrial septal aneurysm / if pfo  echo 6 13  10/23/2011    Past Surgical History  Procedure Laterality Date  .  Dilation and curettage of uterus    . Tonsillectomy    . Ectopic pregnancy surgery    . Knee arthroscopy      both in past    Family History  Problem Relation Age of Onset  . Hypertension Mother     low borderline  . Hypertension Father   . Liver disease Father   . Diabetes Daughter   . ADD / ADHD    . Hyperlipidemia      Maternal grandmother  . Osteoporosis    . Melanoma Sister   . Colon cancer Neg Hx   . Stomach cancer Neg Hx    Social History:  reports that she has never smoked. She has never used smokeless tobacco. She reports that she drinks alcohol. She reports that she does not use illicit drugs.  Allergies:  Allergies  Allergen Reactions  . Norvasc [Amlodipine Besylate] Other (See Comments)    Fatigue and dizziness  . Cefdinir     REACTION: Rash, swelling and itching  . Penicillins     REACTION: swelling up as a child     (Not in a hospital admission)  No results found for this or any previous visit (from the past 48 hour(s)). No results found.  Review of Systems  Constitutional: Negative.   HENT: Negative.   Eyes: Negative.   Respiratory: Negative.   Cardiovascular: Negative.   Gastrointestinal: Negative.   Genitourinary: Negative.  Musculoskeletal: Positive for joint pain.  Skin: Negative.   Neurological: Negative.   Psychiatric/Behavioral: Negative.     There were no vitals taken for this visit. Physical Exam  Constitutional: She is oriented to person, place, and time. She appears well-developed and well-nourished.  HENT:  Head: Normocephalic and atraumatic.  Eyes: Conjunctivae and EOM are normal. Pupils are equal, round, and reactive to light.  Neck: Normal range of motion. Neck supple.  Cardiovascular: Normal rate and regular rhythm.   Respiratory: Effort normal and breath sounds normal.  GI: Soft. Bowel sounds are normal.  Musculoskeletal:  Left Shoulder: Inspection and Palpation:Tenderness- AC joint tender to palpation (mild) and  subacromial space tender to palpation. no tenderness to palpation of the [place] joint and no tenderness to palpation of the clavicle. Swelling- none. Tissue tension/texture is - soft. Sensation- intact to light touch. Skin:Color- no ecchymosis and no erythema. ROM: Internal Rotation:AROM- within 5% of the contralateral extremity. External Rotation:AROM- full. Flexion:AROM- within 5% of the contralateral extremity. Glenohumeral Abduction:AROM- full. Strength and Tone:Biceps- 5/5. Triceps- 5/5. Abduction- 5/5 and painful. Internal Rotation- 5/5. External Rotation- 5/5. Instability- sulcus sign negative. Impingement- impingement sign positive and secondary impingement sign positive. Deformities/Malalignments/Discrepancies- no deformities noted. Special Testing- Speed's test negative. Left Elbow:Special Testing- Tinel's Sign negative over the ulnar nerve and Tinel's Sign negative over the median nerve.  Neurological: She is alert and oriented to person, place, and time. She has normal reflexes.  Skin: Skin is warm and dry.  Psychiatric: She has a normal mood and affect.     MRI L shoulder images and report reviewed by Dr. Beane with a tear of the supraspinatus, retracted 3cm. No other tears noted. Mild AC arthrosis which narrows the subacromial space.  Assessment/Plan L shoulder impingement, RCT  Pt with L sided neck and shoulder pain following a traction-type injury 17 weeks ago, while trying to stop a rolling wheelchair when her left arm was pulled across her body, some improvement in her symptoms with PT. Specifically she has ongoing shoulder pain with motion, impingement, refractory to steroid injection, with MRI significant for a retracted RCT. We discussed relevant anatomy for the cervical spine and shoulder. In regards to her neck, we discussed continued importance of proper posture, avoiding extension, continued HEP, and possible trigger point injection at the left medial  scapular border if ongoing. Would not recommend an ESI or SNRB at this point given her lack of radicular symptoms.  In regards to her left shoulder, with her ongoing pain, impingement symptoms, and retracted RCT on MRI, recommend proceeding with surgery, L shoulder mini-open SAD and RCR, possible patch graft. Discussed the procedure itself as well as risks, complications, alternatives including but not limited to DVT, PE, infx, bleeding, failure of procedure, need for secondary procedure, anesthesia risk. Discussed post-op protocols, time out of work (at least 6 weeks), PT, use of sling, restrictions and modifications. Discussed the possibility that she may not regain full ROM due to the severity of her tear. Also discussed that it will be 3-5 months post-op until she is at [facility]. We also discussed the possibility of nonsurgical options, given the severity of her tear, would worry this could lead to instability, worsening tear, worsening arthritic changes, possible future need for total shoulder replacement. All her questions were answered and she desires to proceed. She will continue with her current work restrictions. Will proceed accordingly with scheduling surgery, and she will follow up 10-14 days post-op for suture removal.  I had a long discussion   with the patient concerning the risks and benefits of a rotator cuff repair, including bleeding, infection, prolonged postoperative recovery, which may require 3 to 5 months until maximum medical improvement. Overight procedure with initiation of early passive range of motion within physical therapy. Avoid any active motion for the first six weeks. This is all in an effort to avoid recurrent tear of the rotator cuff and adhesive capsulitis. Return to work without use of the arm can be obtained following two weeks. However, driving will be a challenge. We also discussed the possibility of requiring implants including bone anchors,as well as an Allograft patch graft  if a massive rotator cuff tear is encountered. Removal of any bones for spurs as well as bursitis will be performed during the procedure and also any associated anesthetic complications as well.  Left shoulder mini-open SAD, RCR, possible patch graft  Gwendolynn Merkey M. for Dr. Tonita Cong 06/24/2013, 1:49 PM

## 2013-07-07 NOTE — Preoperative (Signed)
Beta Blockers   Reason not to administer Beta Blockers:Not Applicable 

## 2013-07-07 NOTE — Anesthesia Postprocedure Evaluation (Signed)
Anesthesia Post Note  Patient: [name]  Procedure(s) Performed: Procedure(s) (LRB): LEFT SHOULDER MINI OPEN SUBACROMIAL DECOMPRESSION ROTATOR CUFF REPAIR AND POSSIBLE PATCH GRAFT  (Left)  Anesthesia type: General  Patient location: PACU  Post pain: Pain level controlled  Post assessment: Post-op Vital signs reviewed  Last Vitals: BP 149/73  Pulse 83  Temp(Src) 36.9 C (Oral)  Resp 10  SpO2 100%  Post vital signs: Reviewed  Level of consciousness: sedated  Complications: No apparent anesthesia complications

## 2013-07-07 NOTE — Discharge Instructions (Signed)
Aquacel dressing to remain in place x 7 days. May shower with aquacel in place. After 7 days, remove aquacel, keep steri strips in place, and place new dressing with gauze and tape which should be kept clean and dry and changed daily. Use sling at times except when exercising or showering No driving for 4-6 weeks No lifting for 6 weeks operative arm Pendulum exercises as instructed. Ok to move wrist,elbow, and hand. See Dr. Tonita Cong in 10-14 days. Take one aspirin per day with a meal if not on a blood thinner or allergic to aspirin.

## 2013-07-08 ENCOUNTER — Encounter (HOSPITAL_COMMUNITY): Payer: Self-pay | Admitting: Specialist

## 2013-07-08 LAB — BASIC METABOLIC PANEL
BUN: 16 mg/dL (ref 6–23)
CALCIUM: 9.2 mg/dL (ref 8.4–10.5)
CHLORIDE: 100 meq/L (ref 96–112)
CO2: 24 mEq/L (ref 19–32)
Creatinine, Ser: 0.65 mg/dL (ref 0.50–1.10)
GFR calc non Af Amer: >90 mL/min (ref >90)
Glucose, Bld: 137 mg/dL — ABNORMAL HIGH (ref 70–99)
Potassium: 3.6 mEq/L — ABNORMAL LOW (ref 3.7–5.3)
Sodium: 137 mEq/L (ref 137–147)

## 2013-07-08 MED ORDER — KETOROLAC TROMETHAMINE 10 MG PO TABS
10.0000 mg | ORAL_TABLET | Freq: Four times a day (QID) | ORAL | Status: DC
  Administered 2013-07-08 – 2013-07-09 (×5): 10 mg via ORAL
  Filled 2013-07-08 (×9): qty 1

## 2013-07-08 NOTE — Evaluation (Signed)
Occupational Therapy Evaluation Patient Details Name: [name] MRN: [id number] DOB: December 11, 1955 Today's Date: 07/08/2013 Time: 7893-8101 OT Time Calculation (min): 55 min  OT Assessment / Plan / Recommendation History of present illness pt was admitted for L RCR   Clinical Impression   Pt was admitted for the above surgery.  We will follow her in acute to reinforce education.  Pt became nauseas during OT with decreased BP and husband verbalizes but did not get to practice donning sling    OT Assessment  Patient needs continued OT Services    Follow Up Recommendations   (pt will follow up with [name] for further therapy)    Barriers to Discharge      Equipment Recommendations  None recommended by OT    Recommendations for Other Services    Frequency  Min 2X/week    Precautions / Restrictions Precautions Precautions: Shoulder Type of Shoulder Precautions: talked to [name].  Ok to do gentle pendulums. If she uses LUE, support on table and bend elbow to reach face Shoulder Interventions: Timmothy Sours joy ultra sling;Off for dressing/bathing/exercises Required Braces or Orthoses: Sling Restrictions Weight Bearing Restrictions: Yes LUE Weight Bearing: Non weight bearing Other Position/Activity Restrictions: OK for support on table when flexing elbow, reaching to face.  This is non-dominant hand   Pertinent Vitals/Pain Pt with high pain but willing to try OT as pain medicine made her sleepy and IV was only option.  Became nauseated and BP was low.  Repositioned in bed    ADL  Upper Body Bathing: Maximal assistance Where Assessed - Upper Body Bathing: Unsupported sitting Upper Body Dressing: Maximal assistance Where Assessed - Upper Body Dressing: Unsupported sitting Lower Body Dressing: +1 Total assistance Where Assessed - Lower Body Dressing: Unsupported sit to stand Transfers/Ambulation Related to ADLs: stood for pendulums but did not tolerate well and also did not really  get movement.   Pt helped support LUE with mobility and during adls ADL Comments: worked through ADL; husband present and observed.  Educated on shoulder precautions and pendulums as well as distal ROM. Educated on positioning in bed and sling. Therapist donned sling as pt felt nauseas and BP low--see vital signs.  Also see education portion of chart; shoulder protocol education completed.  Handout given and pt/husband verbalize understanding.  Husband will have nursing page me later so he can don sling, if pt can tolerated this    OT Diagnosis: Generalized weakness;Acute pain  OT Problem List: Pain;Decreased strength;Decreased activity tolerance OT Treatment Interventions: Self-care/ADL training;DME and/or AE instruction;Patient/family education;Therapeutic exercise   OT Goals(Current goals can be found in the care plan section) Acute Rehab OT Goals Patient Stated Goal: have a good recovery OT Goal Formulation: With patient/family Time For Goal Achievement: 07/15/13 Potential to Achieve Goals: Good ADL Goals Pt Will Perform Lower Body Bathing: with max assist;sit to/from stand Pt Will Perform Lower Body Dressing: with max assist;sit to/from stand Additional ADL Goal #1: pt will be independent with pendulum exercises Additional ADL Goal #2: Husband will don sling with supervision  Visit Information  Last OT Received On: 07/08/13 Assistance Needed: +1 History of Present Illness: pt was dmitted for L RCR       Prior [place] expects to be discharged to:: Private residence Living Arrangements: Spouse/significant other Additional Comments: pt moving well Prior Function Level of Independence: Independent Communication Communication: No difficulties Dominant Hand: Right         Vision/Perception  Cognition  Cognition Arousal/Alertness: Awake/alert Behavior During Therapy: WFL for tasks assessed/performed Overall Cognitive Status: Within  Functional Limits for tasks assessed    Extremity/Trunk Assessment Upper Extremity Assessment Upper Extremity Assessment: LUE deficits/detail LUE Deficits / Details: immobilized due to RCR.  Distal ROM wlfs     Mobility Bed Mobility Overal bed mobility: Modified Independent General bed mobility comments: hob raised.  Pt plans to sleep in lounge chair.  Transfers Overall transfer level: Needs assistance Equipment used: None Transfers: Sit to/from Stand Sit to Stand: Min guard (due to dizziness)     Exercise     Balance     End of Session OT - End of Session Activity Tolerance: Patient limited by pain Patient left: in bed;with bed alarm set;with family/visitor present Nurse Communication:  (nausea medication)  GO Functional Assessment Tool Used: clinical observation Functional Limitation: Self care Self Care Current Status (Z2080): 100 percent impaired, limited or restricted Self Care Goal Status (E2336): At least 60 percent but less than 80 percent impaired, limited or restricted   [facility] 07/08/2013, 11:31 AM Lesle Chris, OTR/L [phone] 07/08/2013

## 2013-07-08 NOTE — Progress Notes (Signed)
Subjective: 1 Day Post-Op Procedure(s) (LRB): LEFT SHOULDER MINI OPEN SUBACROMIAL DECOMPRESSION ROTATOR CUFF REPAIR AND POSSIBLE PATCH GRAFT  (Left) Patient reports pain as 3 on 0-10 scale.    Objective: Vital signs in last 24 hours: Temp:  [97.4 F (36.3 C)-98.4 F (36.9 C)] 98.2 F (36.8 C) (02/27 0531) Pulse Rate:  [70-93] 73 (02/27 0531) Resp:  [8-21] 8 (02/27 0531) BP: (95-165)/(60-91) 112/73 mmHg (02/27 0531) SpO2:  [93 %-100 %] 97 % (02/27 0531) Weight:  [96.616 kg (213 lb)] 96.616 kg (213 lb) (02/26 1445)  Intake/Output from previous day: 02/26 0701 - 02/27 0700 In: 2914.2 [P.O.:1080; I.V.:1784.2; IV Piggyback:50] Out: 900 [Urine:900] Intake/Output this shift:     Recent Labs  07/07/13 1605  HGB 12.9    Recent Labs  07/07/13 1605  WBC 14.2*  RBC 4.15  HCT 37.5  PLT 191    Recent Labs  07/07/13 1605 07/08/13 0430  NA  --  137  K  --  3.6*  CL  --  100  CO2  --  24  BUN  --  16  CREATININE 0.65 0.65  GLUCOSE  --  137*  CALCIUM  --  9.2   No results found for this basename: LABPT, INR,  in the last 72 hours  Neurologically intact Neurovascular intact Intact pulses distally Compartment soft Discussed OR and F/U Assessment/Plan: 1 Day Post-Op Procedure(s) (LRB): LEFT SHOULDER MINI OPEN SUBACROMIAL DECOMPRESSION ROTATOR CUFF REPAIR AND POSSIBLE PATCH GRAFT  (Left) Discharge home with home health after PT PROM only  Jackelyn Illingworth C 07/08/2013, 7:46 AM

## 2013-07-08 NOTE — Progress Notes (Signed)
Spoke with Judson Roch, RN. Pt does not feel she can be D/C'd today due to pain and nausea. Nausea has improved some since her PT/OT visit. She is taking OxyIR for pain and reports 5-6/10 level with that, does not feel it is controlled enough for D/C home. Will order toradol for pain as well, 10mg  PO q6h. She typically prefers not to take NSAIDs, does NOT have an allergy, she is concerned about pre-fatty liver. I explained to Judson Roch and she will explain to pt the short term use of toradol to help get her pain under control. Will hold her D/C today and plan to D/C tomorrow to home. If toradol is helpful it could also be added to her meds on D/C. Discussed with Dr. Tonita Cong.

## 2013-07-08 NOTE — Op Note (Signed)
[name], [name]           ACCOUNT NO.:  [id number]  MEDICAL RECORD NO.:  [id number]  LOCATION:  8119                         FACILITY:  [facility]  PHYSICIAN:  Susa Day, M.D.    DATE OF BIRTH:  05-18-1955  DATE OF PROCEDURE:  07/07/2013 DATE OF DISCHARGE:                              OPERATIVE REPORT   PREOPERATIVE DIAGNOSIS:  Massive tear of the rotator cuff retracted.  POSTOPERATIVE DIAGNOSIS:  Massive tear of the rotator cuff retracted.  PROCEDURES PERFORMED:  Mini-open rotator cuff repair, subacromial decompression.  IMPLANTS:  SwiveLock x2, PushLocks x2, TigerTape x2.  ANESTHESIA:  General.  ASSISTANT:  Cleophas Dunker, PA who was needed for positioning of the arm rotation, insertion of the implants.  HISTORY:  A 58 year old, retracted tear of the rotator cuff.  Indicated for repair.  Risk and benefits discussed including bleeding, infection, damage to neurovascular structures, DVT, PE, anesthetic complications, etc.  TECHNIQUE:  With the patient in a supine beach-chair position, after induction of adequate general anesthesia and 2 g of Kefzol, left shoulder and upper extremity were prepped and draped in usual sterile fashion after exam under anesthesia revealed full range.  Surgical marker was utilized to delineate the acromion, AC joint, and coracoid. A standard posterolateral incision was utilized 2 cm in length. Subcutaneous tissue was dissected.  Electrocautery was utilized to achieve hemostasis.  The raphe between the anterior and the lateral heads of the deltoid was identified and divided in line with the skin incision.  We placed a self-retaining retractor.  Released CA ligament. A small spur off the anterolateral aspect of the acromion was removed with 3-mm Kerrison.  Retracted tear was noted in the rotator cuff.  The biceps tendon was exposed.  It is mainly supraspinatus and part of the infraspinatus.  We fashioned the trough just 2 mm of the  lateral portion of the articular surface and that was removed with a Beyer rongeur.  We mobilized the cuff meticulously on its bursal and articular surface of the glenohumeral joint laterally.  We were able to mobilize this and advanced it lateral to the bicipital groove.  I therefore placed 2 PushLocks utilizing an awl first in the bed, 1 anteriorly and 1 posteriorly.  We then threaded our fiber tape with help of the scorpion suture passer through the infraspinatus and the supraspinatus.  The cuff was then advanced anteriorly and laterally to cover the cancellous bed to the lateral aspect of the biceps.  I then placed them in 2 PushLocks over the lateral aspect of the greater tuberosity.  I first used an awl, insertion of the PushLock, appropriate tensioning.  We inserted them for a double row fixation configuration, we actually had full coverage and deliverance to at least the midportion of the cancellous bed.  We did not overtighten this.  I then used rescue sutures to suture side-to-side from the leading edge of the supraspinatus to the subscap.  The biceps was unencumbered.  She had good flexion and extension of the arm without difficulty.  Wound copiously irrigated again, full coverage was noted. No active bleeding.  Then, we excised the previous bursa.  I then repaired the raphe with 1 Vicryl, subcu with 2-0, and skin  with Prolene. Sterile dressing applied, placed in an abduction pillow, extubated without difficulty, and transported to the recovery room in satisfactory condition.  The patient tolerated the procedure well.  No complications.  Second assistant, Cleophas Dunker, PA.  Minimal blood loss.     Susa Day, M.D.     Geralynn Rile  D:  07/07/2013  T:  07/08/2013  Job:  [id number]

## 2013-07-08 NOTE — Discharge Summary (Signed)
Patient ID: [name] MRN: [id number] DOB/AGE: [birth year] 58 y.o.  Admit date: 07/07/2013 Discharge date: 07/08/2013  Admission Diagnoses:  Principal Problem:   Left rotator cuff tear Active Problems:   Tear of left rotator cuff   Discharge Diagnoses:  Same  Past Medical History  Diagnosis Date  . Depression   . Hypothyroidism   . Headache(784.0)     hx of migraines when younger  . Fibromyalgia   . Hypertension   . Heart palpitations     hx with normal holter event monitoring  . Hx: UTI (urinary tract infection)   . Skin cancer     basal, squamous cell  . Atrial septal aneurysm / if pfo  echo 6 13  10/23/2011  . Fatty liver     "pre fatty liver"  . Pneumonia 1972    hx of  . GERD (gastroesophageal reflux disease)     hx of, none in a long time    Surgeries: Procedure(s): LEFT SHOULDER MINI OPEN SUBACROMIAL DECOMPRESSION ROTATOR CUFF REPAIR AND POSSIBLE PATCH GRAFT  on 07/07/2013   Consultants:  none  Discharged Condition: Improved  Hospital Course: [name] is an 58 y.o. female who was admitted 07/07/2013 for operative treatment ofLeft rotator cuff tear. Patient has severe unremitting pain that affects sleep, daily activities, and work/hobbies. After pre-op clearance the patient was taken to the operating room on 07/07/2013 and underwent  Procedure(s): LEFT SHOULDER MINI OPEN SUBACROMIAL DECOMPRESSION ROTATOR CUFF REPAIR AND POSSIBLE PATCH GRAFT .    Patient was given perioperative antibiotics: Anti-infectives   Start     Dose/Rate Route Frequency Ordered Stop   07/07/13 1700  clindamycin (CLEOCIN) IVPB 900 mg     900 mg 100 mL/hr over 30 Minutes Intravenous Every 6 hours 07/07/13 1452 07/08/13 0513   07/07/13 1059  polymyxin B 500,000 Units, bacitracin 50,000 Units in sodium chloride irrigation 0.9 % 500 mL irrigation  Status:  Discontinued       As needed 07/07/13 1059 07/07/13 1205   07/07/13 0800  clindamycin (CLEOCIN) IVPB 900 mg     900 mg 100  mL/hr over 30 Minutes Intravenous On call to O.R. 07/07/13 0748 07/07/13 1040       Patient was given sequential compression devices, early ambulation, and chemoprophylaxis to prevent DVT.  Patient benefited maximally from hospital stay and there were no complications.    Recent vital signs: Patient Vitals for the past 24 hrs:  BP Temp Temp src Pulse Resp SpO2 Height Weight  07/08/13 0531 112/73 mmHg 98.2 F (36.8 C) - 73 8 97 % - -  07/08/13 0142 95/60 mmHg 98.3 F (36.8 C) - 70 16 96 % - -  07/07/13 2131 115/77 mmHg 97.7 F (36.5 C) - 74 16 97 % - -  07/07/13 2000 - - - - 16 - - -  07/07/13 1749 145/82 mmHg 98 F (36.7 C) - 92 16 94 % - -  07/07/13 1643 148/81 mmHg 97.7 F (36.5 C) - 76 18 95 % - -  07/07/13 1545 134/80 mmHg 97.4 F (36.3 C) - 76 18 96 % - -  07/07/13 1445 150/91 mmHg 98.2 F (36.8 C) Oral 92 14 94 % 5\' 5"  (1.651 m) 96.616 kg (213 lb)  07/07/13 1443 150/91 mmHg 98.2 F (36.8 C) - 92 - 94 % - -  07/07/13 1400 145/71 mmHg - - 77 13 93 % - -  07/07/13 1345 151/73 mmHg - - 90 18 95 % - -  07/07/13 1330 148/74 mmHg - - 88 17 93 % - -  07/07/13 1315 146/81 mmHg - - 89 12 95 % - -  07/07/13 1300 151/79 mmHg 98.4 F (36.9 C) - 92 11 95 % - -  07/07/13 1245 149/73 mmHg - - 83 10 100 % - -  07/07/13 1230 153/76 mmHg - - 88 14 100 % - -  07/07/13 1215 148/82 mmHg - - 93 14 100 % - -  07/07/13 1209 165/80 mmHg 98.4 F (36.9 C) - 93 17 100 % - -  07/07/13 1016 - - - 74 16 100 % - -  07/07/13 1015 145/72 mmHg - - 81 16 100 % - -  07/07/13 1014 - - - 83 19 100 % - -  07/07/13 1013 - - - 86 18 100 % - -  07/07/13 1012 - - - 82 21 100 % - -  07/07/13 1011 - - - - 18 - - -  07/07/13 1010 - - - - 10 - - -  07/07/13 1009 - - - - 10 - - -  07/07/13 1008 151/74 mmHg - - - 11 - - -     Recent laboratory studies:  Recent Labs  07/07/13 1605 07/08/13 0430  WBC 14.2*  --   HGB 12.9  --   HCT 37.5  --   PLT 191  --   NA  --  137  K  --  3.6*  CL  --  100  CO2  --   24  BUN  --  16  CREATININE 0.65 0.65  GLUCOSE  --  137*  CALCIUM  --  9.2     Discharge Medications:     Medication List         acidophilus Caps capsule  Take 2 capsules by mouth daily.     CHLORELLA PO  Take 5 g by mouth daily.     DHEA PO  Take 7.5 mg by mouth.     docusate sodium 100 MG capsule  Commonly known as:  COLACE  Take 1 capsule (100 mg total) by mouth 2 (two) times daily as needed for mild constipation.     Ginger Root 550 MG Caps  Take 1 capsule by mouth daily.     Iodine (Kelp) Tabs  Take 1 tablet by mouth daily.     Krill Oil 1000 MG Caps  Take 1 capsule by mouth 2 (two) times daily.     Magnesium 250 MG Tabs  Take 2 tablets by mouth daily.     Melatonin CR 3 MG Tbcr  Take by mouth at bedtime.     methocarbamol 500 MG tablet  Commonly known as:  ROBAXIN  Take 1 tablet (500 mg total) by mouth 3 (three) times daily between meals as needed for muscle spasms.     multivitamin with minerals Tabs tablet  Take 1 tablet by mouth daily.     OVER THE COUNTER MEDICATION  Take 2 tablets by mouth 2 (two) times daily. Hepatrope 2 for liver support     OVER THE COUNTER MEDICATION  Take 1 tablet by mouth daily. InflaMed for liver support     oxyCODONE 5 MG immediate release tablet  Commonly known as:  Oxy IR/ROXICODONE  1-2 po q4-6 prn pain     potassium gluconate 595 MG Tabs tablet  Take 595 mg by mouth daily.     progesterone 100 MG capsule  Commonly known as:  PROMETRIUM  Take 100 mg by mouth daily.     thyroid 90 MG tablet  Commonly known as:  ARMOUR  Take 90 mg by mouth every morning.     Ubiquinol 100 MG Caps  Take 1 capsule by mouth daily.     valsartan-hydrochlorothiazide 320-25 MG per tablet  Commonly known as:  DIOVAN-HCT  Take 1 tablet by mouth every morning.     verapamil 120 MG 24 hr capsule  Commonly known as:  VERELAN PM  Take 120 mg by mouth at bedtime.     vitamin C 1000 MG tablet  Take 1,000 mg by mouth daily.      Vitamin D3 10000 UNITS capsule  Take 10,000 Units by mouth daily.     VITAMIN K PO  Take 400 mcg by mouth daily.        Diagnostic Studies: Dg Chest 2 View  07/04/2013   CLINICAL DATA:  pre-op L shoulder RCR, SAD, patch graft  EXAM: CHEST  2 VIEW  COMPARISON:  None.  FINDINGS: The heart size and mediastinal contours are within normal limits. Both lungs are clear. The visualized skeletal structures are unremarkable.  IMPRESSION: No active cardiopulmonary disease.   Electronically Signed   By: Margaree Mackintosh M.D.   On: 07/04/2013 16:25    Disposition: 01-Home or Self Care      Discharge Orders   Future Orders Complete By Expires   Call MD / Call 911  As directed    Comments:     If you experience chest pain or shortness of breath, CALL 911 and be transported to the hospital emergency room.  If you develope a fever above 101 F, pus (white drainage) or increased drainage or redness at the wound, or calf pain, call your surgeon's office.   Constipation Prevention  As directed    Comments:     Drink plenty of fluids.  Prune juice may be helpful.  You may use a stool softener, such as Colace (over the counter) 100 mg twice a day.  Use MiraLax (over the counter) for constipation as needed.   Diet - low sodium heart healthy  As directed    Driving restrictions  As directed    Comments:     No driving for 6 weeks   Increase activity slowly as tolerated  As directed    Lifting restrictions  As directed    Comments:     No lifting for 12 weeks left arm      Follow-up Information   Follow up with Takeysha Bonk C, MD In 2 weeks.   Specialty:  Orthopedic Surgery   Contact information:   [address] [place] [address] [id number]        Signed: Johnn Hai 07/08/2013, 7:51 AM   Patient ID: [name] MRN: [id number] DOB/AGE: [birth year] 58 y.o.  Admit date: 07/07/2013 Discharge date: 07/08/2013  Admission Diagnoses:  Principal Problem:   Left  rotator cuff tear Active Problems:   Tear of left rotator cuff   Discharge Diagnoses:  Same  Past Medical History  Diagnosis Date  . Depression   . Hypothyroidism   . Headache(784.0)     hx of migraines when younger  . Fibromyalgia   . Hypertension   . Heart palpitations     hx with normal holter event monitoring  . Hx: UTI (urinary tract infection)   . Skin cancer     basal, squamous cell  . Atrial septal aneurysm / if pfo  echo 6 13  10/23/2011  . Fatty liver     "pre fatty liver"  . Pneumonia 1972    hx of  . GERD (gastroesophageal reflux disease)     hx of, none in a long time    Surgeries: Procedure(s): LEFT SHOULDER MINI OPEN SUBACROMIAL DECOMPRESSION ROTATOR CUFF REPAIR AND POSSIBLE PATCH GRAFT  on 07/07/2013   Consultants:    Discharged Condition: Improved  Hospital Course: [name] is an 58 y.o. female who was admitted 07/07/2013 for operative treatment ofLeft rotator cuff tear. Patient has severe unremitting pain that affects sleep, daily activities, and work/hobbies. After pre-op clearance the patient was taken to the operating room on 07/07/2013 and underwent  Procedure(s): LEFT SHOULDER MINI OPEN SUBACROMIAL DECOMPRESSION ROTATOR CUFF REPAIR AND POSSIBLE PATCH GRAFT .    Patient was given perioperative antibiotics: Anti-infectives   Start     Dose/Rate Route Frequency Ordered Stop   07/07/13 1700  clindamycin (CLEOCIN) IVPB 900 mg     900 mg 100 mL/hr over 30 Minutes Intravenous Every 6 hours 07/07/13 1452 07/08/13 0513   07/07/13 1059  polymyxin B 500,000 Units, bacitracin 50,000 Units in sodium chloride irrigation 0.9 % 500 mL irrigation  Status:  Discontinued       As needed 07/07/13 1059 07/07/13 1205   07/07/13 0800  clindamycin (CLEOCIN) IVPB 900 mg     900 mg 100 mL/hr over 30 Minutes Intravenous On call to O.R. 07/07/13 0748 07/07/13 1040       Patient was given sequential compression devices, early ambulation, and chemoprophylaxis to  prevent DVT.  Patient benefited maximally from hospital stay and there were no complications.    Recent vital signs: Patient Vitals for the past 24 hrs:  BP Temp Temp src Pulse Resp SpO2 Height Weight  07/08/13 0531 112/73 mmHg 98.2 F (36.8 C) - 73 8 97 % - -  07/08/13 0142 95/60 mmHg 98.3 F (36.8 C) - 70 16 96 % - -  07/07/13 2131 115/77 mmHg 97.7 F (36.5 C) - 74 16 97 % - -  07/07/13 2000 - - - - 16 - - -  07/07/13 1749 145/82 mmHg 98 F (36.7 C) - 92 16 94 % - -  07/07/13 1643 148/81 mmHg 97.7 F (36.5 C) - 76 18 95 % - -  07/07/13 1545 134/80 mmHg 97.4 F (36.3 C) - 76 18 96 % - -  07/07/13 1445 150/91 mmHg 98.2 F (36.8 C) Oral 92 14 94 % 5\' 5"  (1.651 m) 96.616 kg (213 lb)  07/07/13 1443 150/91 mmHg 98.2 F (36.8 C) - 92 - 94 % - -  07/07/13 1400 145/71 mmHg - - 77 13 93 % - -  07/07/13 1345 151/73 mmHg - - 90 18 95 % - -  07/07/13 1330 148/74 mmHg - - 88 17 93 % - -  07/07/13 1315 146/81 mmHg - - 89 12 95 % - -  07/07/13 1300 151/79 mmHg 98.4 F (36.9 C) - 92 11 95 % - -  07/07/13 1245 149/73 mmHg - - 83 10 100 % - -  07/07/13 1230 153/76 mmHg - - 88 14 100 % - -  07/07/13 1215 148/82 mmHg - - 93 14 100 % - -  07/07/13 1209 165/80 mmHg 98.4 F (36.9 C) - 93 17 100 % - -  07/07/13 1016 - - - 74 16 100 % - -  07/07/13 1015 145/72 mmHg - - 81 16 100 % - -  07/07/13 1014 - - - 83 19 100 % - -  07/07/13 1013 - - - 86 18 100 % - -  07/07/13 1012 - - - 82 21 100 % - -  07/07/13 1011 - - - - 18 - - -  07/07/13 1010 - - - - 10 - - -  07/07/13 1009 - - - - 10 - - -  07/07/13 1008 151/74 mmHg - - - 11 - - -     Recent laboratory studies:  Recent Labs  07/07/13 1605 07/08/13 0430  WBC 14.2*  --   HGB 12.9  --   HCT 37.5  --   PLT 191  --   NA  --  137  K  --  3.6*  CL  --  100  CO2  --  24  BUN  --  16  CREATININE 0.65 0.65  GLUCOSE  --  137*  CALCIUM  --  9.2     Discharge Medications:     Medication List         acidophilus Caps capsule  Take 2  capsules by mouth daily.     CHLORELLA PO  Take 5 g by mouth daily.     DHEA PO  Take 7.5 mg by mouth.     docusate sodium 100 MG capsule  Commonly known as:  COLACE  Take 1 capsule (100 mg total) by mouth 2 (two) times daily as needed for mild constipation.     Ginger Root 550 MG Caps  Take 1 capsule by mouth daily.     Iodine (Kelp) Tabs  Take 1 tablet by mouth daily.     Krill Oil 1000 MG Caps  Take 1 capsule by mouth 2 (two) times daily.     Magnesium 250 MG Tabs  Take 2 tablets by mouth daily.     Melatonin CR 3 MG Tbcr  Take by mouth at bedtime.     methocarbamol 500 MG tablet  Commonly known as:  ROBAXIN  Take 1 tablet (500 mg total) by mouth 3 (three) times daily between meals as needed for muscle spasms.     multivitamin with minerals Tabs tablet  Take 1 tablet by mouth daily.     OVER THE COUNTER MEDICATION  Take 2 tablets by mouth 2 (two) times daily. Hepatrope 2 for liver support     OVER THE COUNTER MEDICATION  Take 1 tablet by mouth daily. InflaMed for liver support     oxyCODONE 5 MG immediate release tablet  Commonly known as:  Oxy IR/ROXICODONE  1-2 po q4-6 prn pain     potassium gluconate 595 MG Tabs tablet  Take 595 mg by mouth daily.     progesterone 100 MG capsule  Commonly known as:  PROMETRIUM  Take 100 mg by mouth daily.     thyroid 90 MG tablet  Commonly known as:  ARMOUR  Take 90 mg by mouth every morning.     Ubiquinol 100 MG Caps  Take 1 capsule by mouth daily.     valsartan-hydrochlorothiazide 320-25 MG per tablet  Commonly known as:  DIOVAN-HCT  Take 1 tablet by mouth every morning.     verapamil 120 MG 24 hr capsule  Commonly known as:  VERELAN PM  Take 120 mg by mouth at bedtime.     vitamin C 1000 MG tablet  Take 1,000 mg by mouth daily.     Vitamin D3 10000 UNITS capsule  Take 10,000 Units  by mouth daily.     VITAMIN K PO  Take 400 mcg by mouth daily.        Diagnostic Studies: Dg Chest 2 View  07/04/2013    CLINICAL DATA:  pre-op L shoulder RCR, SAD, patch graft  EXAM: CHEST  2 VIEW  COMPARISON:  None.  FINDINGS: The heart size and mediastinal contours are within normal limits. Both lungs are clear. The visualized skeletal structures are unremarkable.  IMPRESSION: No active cardiopulmonary disease.   Electronically Signed   By: Margaree Mackintosh M.D.   On: 07/04/2013 16:25    Disposition: 01-Home or Self Care      Discharge Orders   Future Orders Complete By Expires   Call MD / Call 911  As directed    Comments:     If you experience chest pain or shortness of breath, CALL 911 and be transported to the hospital emergency room.  If you develope a fever above 101 F, pus (white drainage) or increased drainage or redness at the wound, or calf pain, call your surgeon's office.   Constipation Prevention  As directed    Comments:     Drink plenty of fluids.  Prune juice may be helpful.  You may use a stool softener, such as Colace (over the counter) 100 mg twice a day.  Use MiraLax (over the counter) for constipation as needed.   Diet - low sodium heart healthy  As directed    Driving restrictions  As directed    Comments:     No driving for 6 weeks   Increase activity slowly as tolerated  As directed    Lifting restrictions  As directed    Comments:     No lifting for 12 weeks left arm      Follow-up Information   Follow up with Hudsyn Champine C, MD In 2 weeks.   Specialty:  Orthopedic Surgery   Contact information:   [address] [place] [address] [phone]        Signed: Johnn Hai 07/08/2013, 7:51 AM

## 2013-07-08 NOTE — Progress Notes (Signed)
    CARE MANAGEMENT NOTE 07/08/2013  Patient:  [name],[name]   Account Number:  [id number]  Date Initiated:  07/08/2013  Documentation initiated by:  [facility]  Subjective/Objective Assessment:   LEFT SHOULDER MINI OPEN SUBACROMIAL DECOMPRESSION     Action/Plan:   no NCM needs identified.   Anticipated DC Date:  07/08/2013   Anticipated DC Plan:  [place]  CM consult      Choice offered to / List presented to:             Status of service:  Completed, signed off Medicare Important Message given?   (If response is "NO", the following Medicare IM given date fields will be blank) Date Medicare IM given:   Date Additional Medicare IM given:    Discharge Disposition:  HOME/SELF CARE  Per UR Regulation:    If discussed at Long Length of Stay Meetings, dates discussed:    Comments:  07/08/2013 1100 NCM spoke to pt and states her Worker's Comp RN CM is Richrd Humbles # [phone] fax [phone]. Spoke to RN CM and requested dc summary and op notes faxed to her office. Faxed dc summary and op notes. No HH needs this admission. RN CM will follow up with pt on meds for home. Jonnie Finner RN CCM Case Mgmt phone [phone]

## 2013-07-09 MED ORDER — MAGNESIUM CITRATE PO SOLN
0.5000 | Freq: Once | ORAL | Status: AC
  Administered 2013-07-09: 0.5 via ORAL

## 2013-07-09 MED ORDER — FLEET ENEMA 7-19 GM/118ML RE ENEM
1.0000 | ENEMA | Freq: Every day | RECTAL | Status: DC | PRN
  Administered 2013-07-09: 1 via RECTAL
  Filled 2013-07-09: qty 1

## 2013-07-09 NOTE — Progress Notes (Signed)
Subjective: Doing well.  Pain controlled.  Feeling better today.  No bowel movement x 3-4 days.     Objective: Vital signs in last 24 hours: Temp:  [97.7 F (36.5 C)-98.7 F (37.1 C)] 98.7 F (37.1 C) (02/28 0615) Pulse Rate:  [55-72] 69 (02/28 0615) Resp:  [16] 16 (02/28 0615) BP: (102-149)/(60-80) 149/67 mmHg (02/28 0615) SpO2:  [94 %-99 %] 99 % (02/28 0615)  Intake/Output from previous day: 02/27 0701 - 02/28 0700 In: 880 [P.O.:880] Out: -  Intake/Output this shift:     Recent Labs  07/07/13 1605  HGB 12.9    Recent Labs  07/07/13 1605  WBC 14.2*  RBC 4.15  HCT 37.5  PLT 191    Recent Labs  07/07/13 1605 07/08/13 0430  NA  --  137  K  --  3.6*  CL  --  100  CO2  --  24  BUN  --  16  CREATININE 0.65 0.65  GLUCOSE  --  137*  CALCIUM  --  9.2   No results found for this basename: LABPT, INR,  in the last 72 hours   Exam:  Dressing intact.  Nvi.    Assessment/Plan: Give mag citrate 1/2 bottle now.  D/c home later today.     Dwayna Kentner M 07/09/2013, 9:24 AM

## 2013-07-09 NOTE — Progress Notes (Signed)
Occupational Therapy Treatment Patient Details Name: [name] MRN: [id number] DOB: [birth year] Today's Date: 07/09/2013 Time: 0350-0938 OT Time Calculation (min): 36 min  OT Assessment / Plan / Recommendation  History of present illness pt was dmitted for L RCR   OT comments  Pt limited by pain during session. 3/10 at start of session but increased to 6/10 with pendulum exercises. Husband practiced with donning/doffing sling and educated/reinforced on correct fit/support. Pt states sling feels very supportive and comfortable. All precautions reviewed and pt /spouse verbalize understanding.   Follow Up Recommendations    Pt will follow up with [name] to progress shoulder.   Barriers to Discharge       Equipment Recommendations  None recommended by OT    Recommendations for Other Services    Frequency Min 2X/week   Progress towards OT Goals Progress towards OT goals: Progressing toward goals  Plan Discharge plan remains appropriate    Precautions / Restrictions Precautions Precautions: Shoulder Type of Shoulder Precautions: talked to [name].  Ok to do gentle pendulums. If she uses LUE, support on table and bend elbow to reach face Shoulder Interventions: Timmothy Sours joy ultra sling;Off for dressing/bathing/exercises Required Braces or Orthoses: Sling Restrictions Weight Bearing Restrictions: Yes LUE Weight Bearing: Non weight bearing Other Position/Activity Restrictions: OK for support on table when flexing elbow, reaching to face.  This is non-dominant hand   Pertinent Vitals/Pain 3/10 at start of session 6/10 at end of session.; reposition, had had pain meds    ADL  ADL Comments: husband assist pt to don pants over her legs in supine and then she stood and together they pulled up pants safely. Pt states she has been up to the bathroom to the toilet and reports not feeling dizzy. Pt up with OT in the room and she is slow and cautious as she states she is alittle "nervous"  but did ok. Do not feel she needs PT evaluation. Spouse assisted with donning sling with supervision overall. Reviewed pendulum handout and self care instructions with shoulder precautions. Also discussed positioning while in bed and use of pillows for support.     OT Diagnosis:    OT Problem List:   OT Treatment Interventions:     OT Goals(current goals can now be found in the care plan section)    Visit Information  Last OT Received On: 07/09/13 Assistance Needed: +1 History of Present Illness: pt was dmitted for L RCR    Subjective Data      Prior Functioning       Cognition  Cognition Arousal/Alertness: Awake/alert Behavior During Therapy: WFL for tasks assessed/performed Overall Cognitive Status: Within Functional Limits for tasks assessed    Mobility       Exercises  Other Exercises Other Exercises: pt performed X 10 of all pendulums except counterlcockwise circles (only about 5-6 as she was starting to hurt more) with min guard/min assist for facilitation of body movement. Pt and spouse verbalize understanding of how to do all exercises and feel pt will improve as her confidence builds.    Balance    End of Session OT - End of Session Activity Tolerance: Patient limited by pain Patient left: in bed;with call bell/phone within reach;with family/visitor present  [place], [place] [phone] 07/09/2013, 11:34 AM

## 2013-07-09 NOTE — Plan of Care (Signed)
Problem: Discharge Progression Outcomes Goal: Complications resolved/controlled Outcome: Completed/Met Date Met:  07/09/13 Had BM

## 2013-07-09 NOTE — Progress Notes (Signed)
Updated J.Owens,PA, re: no results from laxative. Order given. Jadae Steinke, [employer]

## 2013-07-09 NOTE — Progress Notes (Signed)
Discharged from floor via w/c, spouse with pt. No changes in assessment. [name]   

## 2013-07-30 ENCOUNTER — Other Ambulatory Visit: Payer: Self-pay | Admitting: Internal Medicine

## 2013-10-18 ENCOUNTER — Encounter: Payer: Self-pay | Admitting: Gastroenterology

## 2013-11-28 ENCOUNTER — Encounter: Payer: Self-pay | Admitting: Family Medicine

## 2013-11-28 ENCOUNTER — Ambulatory Visit (INDEPENDENT_AMBULATORY_CARE_PROVIDER_SITE_OTHER): Payer: BC Managed Care – PPO | Admitting: Family Medicine

## 2013-11-28 VITALS — BP 110/78 | HR 84 | Temp 98.4°F | Wt 225.0 lb

## 2013-11-28 DIAGNOSIS — W57XXXA Bitten or stung by nonvenomous insect and other nonvenomous arthropods, initial encounter: Secondary | ICD-10-CM | POA: Diagnosis not present

## 2013-11-28 DIAGNOSIS — T148 Other injury of unspecified body region: Secondary | ICD-10-CM

## 2013-11-28 NOTE — Progress Notes (Signed)
Garret Reddish, MD Phone: [phone]  Subjective:   [name] is a 58 y.o. year old very pleasant female patient who presents with the following:  Insect bites/rash Patient awoke yesterday morning to note 5 red bumps in her groin region with 2 on left thigh, 1 on right, and 2 at her mons pubis. She noted itching and a burning pain. [place] Her husband was in bed with her and did nto have similar rash. Patient denies new contacts such as detergents or fabric softeners though had tried on some new pants 2 days prior. Patient tried treatment with washing with antibiotic soap and using neosporin with mild improvement. Areas have been stable in size and have a central papule with surrounding erythema (without expansion since initially noting other than what appears to be mild increase when walking and thighs rubbing together). There has been no exudate from papules. Patient has a trip to [place] this upcoming weekend and wanted to make sure she was ok before leaving.   ROS-not ill appearing, no fever/chills. No new medications. Not immunocompromised. No mucus membrane involvement.  Past Medical History- history rotator cuff tear, ASD, OSA, HLD, HTN, anxiety, vit D deficiency, hypothyroidism, migraines  Medications- reviewed and updated Current Outpatient Prescriptions  Medication Sig Dispense Refill  . Ascorbic Acid (VITAMIN C) 1000 MG tablet Take 1,000 mg by mouth daily.      . Cholecalciferol (VITAMIN D3) 10000 UNITS capsule Take 10,000 Units by mouth daily.      . Ginger, Zingiber officinalis, (GINGER ROOT) 550 MG CAPS Take 1 capsule by mouth daily.      . Iodine, Kelp, TABS Take 1 tablet by mouth daily.      Javier Docker Oil 1000 MG CAPS Take 1 capsule by mouth 2 (two) times daily.      . Magnesium 250 MG TABS Take 2 tablets by mouth daily.      . Melatonin CR 3 MG TBCR Take by mouth at bedtime.      . Multiple Vitamin (MULTIVITAMIN WITH MINERALS) TABS  tablet Take 1 tablet by mouth daily.      . Multiple Vitamins-Iron (CHLORELLA PO) Take 5 g by mouth daily.      . Nutritional Supplements (DHEA PO) Take 7.5 mg by mouth.       Marland Kitchen OVER THE COUNTER MEDICATION Take 2 tablets by mouth 2 (two) times daily. Hepatrope 2 for liver support      . OVER THE COUNTER MEDICATION Take 1 tablet by mouth daily. InflaMed for liver support      . potassium gluconate 595 MG TABS tablet Take 595 mg by mouth daily.      . progesterone (PROMETRIUM) 100 MG capsule Take 100 mg by mouth daily.      Marland Kitchen thyroid (ARMOUR) 90 MG tablet Take 90 mg by mouth every morning.       Marland Kitchen Ubiquinol 100 MG CAPS Take 1 capsule by mouth daily.      . verapamil (CALAN-SR) 120 MG CR tablet TAKE 1 TABLET BY MOUTH AT BEDTIME  30 tablet  5  . VITAMIN K PO Take 400 mcg by mouth daily.      Marland Kitchen acidophilus (RISAQUAD) CAPS capsule Take 2 capsules by mouth daily.       No current facility-administered medications for this visit.    Objective: BP 110/78  Pulse 84  Temp(Src) 98.4 F (36.9 C) (Oral)  Wt 225 lb (102.059 kg) Gen: NAD, resting comfortably  on table, well appearing, does not scratch legs during encounter Skin:  5 papules (2 on left inner thigh, 1 on right inner thigh, 2 above mons pubis) with surrounding erythema less than 2-3 cm in diameter. Slight excoriation on largest lesion.   Assessment/Plan:  Insect bites/rash Unclear etiology. Doubt contact dermatitis though ? Due to new shorts that were tried on. These could certainly be insect bites though unclear which type of insect. No signs of infection. Advised of symptomatic care per AVS. Reassurance provided. Follow up prn or if worsening or failing to improve.

## 2013-11-28 NOTE — Patient Instructions (Signed)
Rash/? Bug bites  Not sure of the cause or specific inset that bit you. Do not think this is a hazardous bite such as brown recluse.   Does not look infected  Symptomatic Treatment: benadryl cream OTC or benadryl by mouth. Can try cortisone cream (if want sometime stronger I can call some in). I would ice spots that are most bothersome.   Things to look out for/reasons for return: expanding redness/worsening pain/fevers

## 2013-11-28 NOTE — Progress Notes (Signed)
Pre visit review using our clinic review tool, if applicable. No additional management support is needed unless otherwise documented below in the visit note. 

## 2013-11-30 ENCOUNTER — Ambulatory Visit (AMBULATORY_SURGERY_CENTER): Payer: Self-pay | Admitting: *Deleted

## 2013-11-30 VITALS — Ht 66.0 in | Wt 226.0 lb

## 2013-11-30 DIAGNOSIS — Z86010 Personal history of colonic polyps: Secondary | ICD-10-CM

## 2013-11-30 MED ORDER — MOVIPREP 100 G PO SOLR: ORAL | Status: DC

## 2013-11-30 NOTE — Progress Notes (Signed)
Patient denies any allergies to eggs or soy. May have sensitivity to eggs, but eats them with out any problems.  Patient denies any problems with anesthesia/sedation. Patient denies any oxygen use at home and does not take any diet/weight loss medications. EMMI education assisgned to patient on colonoscopy, this was explained and instructions given to patient. S/P left shoulder surgery 2015. Notified patient she must be on left side during procedure, she states she will call shoulder MD to make sure that is ok, she will call us back if she cannot do this.

## 2013-12-13 ENCOUNTER — Ambulatory Visit (AMBULATORY_SURGERY_CENTER): Payer: BC Managed Care – PPO | Admitting: Gastroenterology

## 2013-12-13 ENCOUNTER — Encounter: Payer: Self-pay | Admitting: Gastroenterology

## 2013-12-13 VITALS — BP 136/70 | HR 64 | Temp 98.2°F | Resp 13 | Ht 66.0 in | Wt 226.0 lb

## 2013-12-13 DIAGNOSIS — D126 Benign neoplasm of colon, unspecified: Secondary | ICD-10-CM

## 2013-12-13 DIAGNOSIS — Z86010 Personal history of colonic polyps: Secondary | ICD-10-CM

## 2013-12-13 MED ORDER — SODIUM CHLORIDE 0.9 % IV SOLN: 500.0000 mL | INTRAVENOUS | Status: DC

## 2013-12-13 NOTE — Progress Notes (Signed)
Report to PACU, RN, vss, BBS= Clear.  

## 2013-12-13 NOTE — Op Note (Signed)
[place]  [address]. [place], [address]   COLONOSCOPY PROCEDURE REPORT PATIENT: [name], [name]  MR#: [id number] BIRTHDATE: [birth year] , 26  yrs. old GENDER: Female ENDOSCOPIST: Ladene Artist, MD, [facility] PROCEDURE DATE:  12/13/2013 PROCEDURE:   Colonoscopy with snare polypectomy and Colonoscopy with hot biopsy/bipolar First Screening Colonoscopy - Avg.  risk and is 50 yrs.  old or older - No.  Prior Negative Screening - Now for repeat screening. N/A  History of Adenoma - Now for follow-up colonoscopy & has been > or = to 3 yrs.  No.  It has been less than 3 yrs since last colonoscopy.  Medical reason.  Polyps Removed Today? Yes. ASA CLASS:   Class III INDICATIONS:Patient's personal history of adenomatous colon polyps and Piecemeal polypectomy in 2014. MEDICATIONS: MAC sedation, administered by CRNA and propofol (Diprivan) 300mg  IV DESCRIPTION OF PROCEDURE:   After the risks benefits and alternatives of the procedure were thoroughly explained, informed consent was obtained.  A digital rectal exam revealed no abnormalities of the rectum.   The LB [id number]  endoscope was introduced through the anus and advanced to the cecum, which was identified by both the appendix and ileocecal valve. No adverse events experienced.   The quality of the prep was good, using MoviPrep  The instrument was then slowly withdrawn as the colon was fully examined.  COLON FINDINGS: Two sessile polyps measuring 7-12 mm in size were found in the transverse colon.  A polypectomy was performed using snare cautery and with a cold snare.  The resection was complete and the polyp tissue was completely retrieved.   Seven sessile polyps ranging between 3-68mm in size were found in the sigmoid colon and rectum.  A polypectomy was performed using hot forceps. The resection was complete and the polyp tissue was completely retrieved.   The colon was otherwise normal.  There was  no diverticulosis, inflammation, polyps or cancers unless previously stated.  Retroflexed views revealed small internal hemorrhoids. The time to cecum=3 minutes 14 seconds.  Withdrawal time=14 minutes 04 seconds.  The scope was withdrawn and the procedure completed. COMPLICATIONS: There were no complications.  ENDOSCOPIC IMPRESSION: 1.   Two sessile polyps measuring 7-12 mm in the transverse; polypectomy performed snare cautery and cold snare 2.   Seven sessile polyps ranging between 3-5 mm in the sigmoid and rectum; polypectomy performed using hot forceps 3.   Small internal hemorrhoids  RECOMMENDATIONS: 1.  Hold aspirin, aspirin products, and anti-inflammatory medication for 2 weeks. 2.  Await pathology results 3.  Repeat Colonoscopy in 3 years pending pathology review  eSigned:  Ladene Artist, MD, [facility] 12/13/2013 11:55 AM

## 2013-12-13 NOTE — Progress Notes (Signed)
Called to room to assist during endoscopic procedure.  Patient ID and intended procedure confirmed with present staff. Received instructions for my participation in the procedure from the performing physician.  

## 2013-12-13 NOTE — Patient Instructions (Signed)
YOU HAD AN ENDOSCOPIC PROCEDURE TODAY AT THE [facility]: Refer to the procedure report that was given to you for any specific questions about what was found during the examination.  If the procedure report does not answer your questions, please call your gastroenterologist to clarify.  If you requested that your care partner not be given the details of your procedure findings, then the procedure report has been included in a sealed envelope for you to review at your convenience later.  YOU SHOULD EXPECT: Some feelings of bloating in the abdomen. Passage of more gas than usual.  Walking can help get rid of the air that was put into your GI tract during the procedure and reduce the bloating. If you had a lower endoscopy (such as a colonoscopy or flexible sigmoidoscopy) you may notice spotting of blood in your stool or on the toilet paper. If you underwent a bowel prep for your procedure, then you may not have a normal bowel movement for a few days.  DIET: Your first meal following the procedure should be a light meal and then it is ok to progress to your normal diet.  A half-sandwich or bowl of soup is an example of a good first meal.  Heavy or fried foods are harder to digest and may make you feel nauseous or bloated.  Likewise meals heavy in dairy and vegetables can cause extra gas to form and this can also increase the bloating.  Drink plenty of fluids but you should avoid alcoholic beverages for 24 hours.  ACTIVITY: Your care partner should take you home directly after the procedure.  You should plan to take it easy, moving slowly for the rest of the day.  You can resume normal activity the day after the procedure however you should NOT DRIVE or use heavy machinery for 24 hours (because of the sedation medicines used during the test).    SYMPTOMS TO REPORT IMMEDIATELY: A gastroenterologist can be reached at any hour.  During normal business hours, 8:30 AM to 5:00 PM Monday through Friday,  call [phone].  After hours and on weekends, please call the GI answering service at [phone] who will take a message and have the physician on call contact you.   Following lower endoscopy (colonoscopy or flexible sigmoidoscopy):  Excessive amounts of blood in the stool  Significant tenderness or worsening of abdominal pains  Swelling of the abdomen that is new, acute  Fever of 100F or higher  FOLLOW UP: If any biopsies were taken you will be contacted by phone or by letter within the next 1-3 weeks.  Call your gastroenterologist if you have not heard about the biopsies in 3 weeks.  Our staff will call the home number listed on your records the next business day following your procedure to check on you and address any questions or concerns that you may have at that time regarding the information given to you following your procedure. This is a courtesy call and so if there is no answer at the home number and we have not heard from you through the emergency physician on call, we will assume that you have returned to your regular daily activities without incident.  SIGNATURES/CONFIDENTIALITY: You and/or your care partner have signed paperwork which will be entered into your electronic medical record.  These signatures attest to the fact that that the information above on your After Visit Summary has been reviewed and is understood.  Full responsibility of the confidentiality of this   discharge information lies with you and/or your care-partner.  Hold aspirin and aspirin products, plus anti-inflammatories for two weeks.  Some of the polyps were taken off with hot cautery, and we don't want them to bleed.   Please, read the handouts given to you by your recovery room nurse.

## 2013-12-13 NOTE — Progress Notes (Signed)
Recent left rotator cuff surgery.notified tech and crna in proc rm

## 2013-12-14 ENCOUNTER — Telehealth: Payer: Self-pay | Admitting: *Deleted

## 2013-12-14 NOTE — Telephone Encounter (Signed)
Message left

## 2013-12-22 ENCOUNTER — Encounter: Payer: Self-pay | Admitting: Gastroenterology

## 2014-01-05 ENCOUNTER — Encounter: Payer: Self-pay | Admitting: Gastroenterology

## 2014-02-22 ENCOUNTER — Other Ambulatory Visit: Payer: Self-pay | Admitting: Internal Medicine

## 2014-02-22 NOTE — Telephone Encounter (Signed)
Spoke to the pt and informed her that she is past due for her yearly wellness exam and lab work.  She could not schedule her appointments due to her driving.  She will call back to schedule appointments.  Will send in a 30 day supply of medications until appointment is made.  Will also send a reminder letter.

## 2014-03-13 ENCOUNTER — Encounter: Payer: Self-pay | Admitting: Gastroenterology

## 2014-03-16 ENCOUNTER — Other Ambulatory Visit: Payer: BC Managed Care – PPO

## 2014-03-21 ENCOUNTER — Other Ambulatory Visit (INDEPENDENT_AMBULATORY_CARE_PROVIDER_SITE_OTHER): Payer: BC Managed Care – PPO

## 2014-03-21 DIAGNOSIS — Z0000 Encounter for general adult medical examination without abnormal findings: Secondary | ICD-10-CM

## 2014-03-21 LAB — BASIC METABOLIC PANEL
BUN: 22 mg/dL (ref 6–23)
CALCIUM: 9.4 mg/dL (ref 8.4–10.5)
CHLORIDE: 104 meq/L (ref 96–112)
CO2: 28 meq/L (ref 19–32)
CREATININE: 0.8 mg/dL (ref 0.4–1.2)
GFR: 83.73 mL/min (ref >60.00)
GLUCOSE: 90 mg/dL (ref 70–99)
Potassium: 3.8 mEq/L (ref 3.5–5.1)
Sodium: 140 mEq/L (ref 135–145)

## 2014-03-21 LAB — CBC WITH DIFFERENTIAL/PLATELET
BASOS ABS: 0 10*3/uL (ref 0.0–0.1)
Basophils Relative: 0.5 % (ref 0.0–3.0)
EOS ABS: 0.2 10*3/uL (ref 0.0–0.7)
Eosinophils Relative: 2.5 % (ref 0.0–5.0)
HCT: 41.1 % (ref 36.0–46.0)
Hemoglobin: 13.5 g/dL (ref 12.0–15.0)
Lymphocytes Relative: 34.1 % (ref 12.0–46.0)
Lymphs Abs: 3.1 10*3/uL (ref 0.7–4.0)
MCHC: 32.8 g/dL (ref 30.0–36.0)
MCV: 91.3 fl (ref 78.0–100.0)
MONOS PCT: 6.8 % (ref 3.0–12.0)
Monocytes Absolute: 0.6 10*3/uL (ref 0.1–1.0)
NEUTROS ABS: 5.1 10*3/uL (ref 1.4–7.7)
Neutrophils Relative %: 56.1 % (ref 43.0–77.0)
PLATELETS: 209 10*3/uL (ref 150.0–400.0)
RBC: 4.5 Mil/uL (ref 3.87–5.11)
RDW: 13.6 % (ref 11.5–15.5)
WBC: 9.2 10*3/uL (ref 4.0–10.5)

## 2014-03-21 LAB — LIPID PANEL
Cholesterol: 249 mg/dL — ABNORMAL HIGH (ref 0–200)
HDL: 59.7 mg/dL (ref >39.00)
LDL Cholesterol: 179 mg/dL — ABNORMAL HIGH (ref 0–99)
NONHDL: 189.3
TRIGLYCERIDES: 52 mg/dL (ref 0.0–149.0)
Total CHOL/HDL Ratio: 4
VLDL: 10.4 mg/dL (ref 0.0–40.0)

## 2014-03-21 LAB — TSH: TSH: 0.8 u[IU]/mL (ref 0.35–4.50)

## 2014-03-21 LAB — HEPATIC FUNCTION PANEL
ALK PHOS: 99 U/L (ref 39–117)
ALT: 33 U/L (ref 0–35)
AST: 24 U/L (ref 0–37)
Albumin: 3.6 g/dL (ref 3.5–5.2)
BILIRUBIN TOTAL: 0.7 mg/dL (ref 0.2–1.2)
Bilirubin, Direct: 0.1 mg/dL (ref 0.0–0.3)
Total Protein: 7 g/dL (ref 6.0–8.3)

## 2014-03-24 ENCOUNTER — Other Ambulatory Visit: Payer: Self-pay | Admitting: Internal Medicine

## 2014-03-24 NOTE — Telephone Encounter (Signed)
Pt has CPX scheduled for 03/28/14.  Sent to the pharmacy by e-scribe.

## 2014-03-28 ENCOUNTER — Ambulatory Visit (INDEPENDENT_AMBULATORY_CARE_PROVIDER_SITE_OTHER): Payer: BC Managed Care – PPO | Admitting: Internal Medicine

## 2014-03-28 ENCOUNTER — Encounter: Payer: Self-pay | Admitting: Internal Medicine

## 2014-03-28 VITALS — BP 138/80 | Temp 97.9°F | Ht 65.5 in | Wt 232.4 lb

## 2014-03-28 DIAGNOSIS — I10 Essential (primary) hypertension: Secondary | ICD-10-CM

## 2014-03-28 DIAGNOSIS — Z23 Encounter for immunization: Secondary | ICD-10-CM

## 2014-03-28 DIAGNOSIS — Z2821 Immunization not carried out because of patient refusal: Secondary | ICD-10-CM

## 2014-03-28 DIAGNOSIS — R0989 Other specified symptoms and signs involving the circulatory and respiratory systems: Secondary | ICD-10-CM

## 2014-03-28 DIAGNOSIS — Z0000 Encounter for general adult medical examination without abnormal findings: Secondary | ICD-10-CM | POA: Insufficient documentation

## 2014-03-28 DIAGNOSIS — E785 Hyperlipidemia, unspecified: Secondary | ICD-10-CM

## 2014-03-28 MED ORDER — VALSARTAN-HYDROCHLOROTHIAZIDE 320-25 MG PO TABS: 1.0000 | ORAL_TABLET | Freq: Every day | ORAL | Status: DC

## 2014-03-28 MED ORDER — VERAPAMIL HCL ER 120 MG PO TBCR: 120.0000 mg | EXTENDED_RELEASE_TABLET | Freq: Every day | ORAL | Status: DC

## 2014-03-28 NOTE — Progress Notes (Signed)
Pre visit review using our clinic review tool, if applicable. No additional management support is needed unless otherwise documented below in the visit note.  Chief Complaint  Patient presents with  . Annual Exam    HPI: Patient  [name] 58 y.o. comes in today for Preventive Health Care visit  He is due for lab monitoring for her hypertension medications and refills. Since her last visit she has had her shoulder surgery on the (Dr. Being still not been back to work since February 2015. Can raise her arm above her head on a regular basis. She sees alternative medicine doctor in [place] who puts her on Armour Thyroid DHEA and progesterone and recently added estriol cream. She had lost a good bit of weight 40-50 pounds and felt much better energy level by eating clean however has put weight back on for various reasons. There is what she has to do   Health Maintenance  Topic Date Due  . MAMMOGRAM  01/18/2004  . ZOSTAVAX  01/17/2014  . INFLUENZA VACCINE  08/10/2014 (Originally 12/10/2013)  . PAP SMEAR  12/12/2016  . COLONOSCOPY  12/13/2016  . TETANUS/TDAP  03/28/2024   Health Maintenance Review LIFESTYLE:  Exercise:   No  Tobacco/ETS:no Alcohol:  1 per month  Sugar beverages: no Sleep: waking up tired some snoring using flonase  8 Drug use: no Colonoscopy: yes  PAP: every year.    Mammo due  Arm issue  ROS:  GEN/ HEENT: No fever, significant weight changes sweats does get headaches up from the neck sometime related to position. headaches vision problems hearing changes, CV/ PULM; No chest pain shortness of breath cough, syncope,edema  change in exercise tolerance. GI /GU: No adominal pain, vomiting, change in bowel habits. No blood in the stool. No significant GU symptoms. SKIN/HEME: ,no acute skin rashes suspicious lesions or bleeding. No lymphadenopathy, nodules, masses.  NEURO/ PSYCH:  No neurologic signs such as weakness numbness. No depression anxiety. IMM/  Allergy: No unusual infections.  Allergy .   REST of 12 system review negative except as per HPI   Past Medical History  Diagnosis Date  . Depression   . Hypothyroidism   . Headache(784.0)     hx of migraines when younger  . Fibromyalgia   . Hypertension   . Heart palpitations     hx with normal holter event monitoring  . Hx: UTI (urinary tract infection)   . Skin cancer     basal, squamous cell  . Atrial septal aneurysm / if pfo  echo 6 13  10/23/2011  . Fatty liver     "pre fatty liver"  . Pneumonia 1972    hx of  . GERD (gastroesophageal reflux disease)     hx of, none in a long time   Past Surgical History  Procedure Laterality Date  . Tonsillectomy    . Ectopic pregnancy surgery  1983  . Knee arthroscopy      both in past  . Tonsillectomy  1971  . Skin cancer excision Bilateral     arm, legs, and chest  . Dilation and curettage of uterus  1978  . Shoulder open rotator cuff repair Left 07/07/2013    Procedure: LEFT SHOULDER MINI OPEN SUBACROMIAL DECOMPRESSION ROTATOR CUFF REPAIR AND POSSIBLE PATCH GRAFT ;  Surgeon: Johnn Hai, MD;  Location: [facility];  Service: Orthopedics;  Laterality: Left;  with interscaline block     Family History  Problem Relation Age of Onset  . Hypertension Mother  low borderline  . Hypertension Father   . Liver disease Father   . Diabetes Daughter   . ADD / ADHD    . Hyperlipidemia      Maternal grandmother  . Osteoporosis    . Melanoma Sister   . Colon cancer Neg Hx   . Stomach cancer Neg Hx     History   Social History  . Marital Status: Married    Spouse Name: N/A    Number of Children: Y  . Years of Education: N/A   Occupational History  . TEACHER [employer]    Social History Main Topics  . Smoking status: Never Smoker   . Smokeless tobacco: Never Used  . Alcohol Use: Yes     Comment: rare  . Drug Use: No  . Sexual Activity: None   Other Topics Concern  . None   Social History Narrative   [employer]  [employer] [place]  Not workking since last February .    Married   Special educ   HH of 4   3 outside dogs, 8 goats, 40 chickens and quail. [place]   Job stresses   Ex husband passes away                Outpatient Encounter Prescriptions as of 03/28/2014  Medication Sig  . acidophilus (RISAQUAD) CAPS capsule Take 2 capsules by mouth daily.  . Ascorbic Acid (VITAMIN C) 1000 MG tablet Take 1,000 mg by mouth daily.  . Cholecalciferol (VITAMIN D3) 10000 UNITS capsule Take 10,000 Units by mouth daily.  Marland Kitchen DHEA 10 MG TABS Take by mouth.  . estradiol (ESTRACE) 0.1 MG/GM vaginal cream Place 1 Applicatorful vaginally at bedtime.  . fluticasone (FLONASE) 50 MCG/ACT nasal spray   . Ginger, Zingiber officinalis, (GINGER ROOT) 550 MG CAPS Take 1 capsule by mouth daily.  . Iodine, Kelp, TABS Take 1 tablet by mouth daily.  Javier Docker Oil 1000 MG CAPS Take 1 capsule by mouth 2 (two) times daily.  . Magnesium 250 MG TABS Take 2 tablets by mouth daily.  . Melatonin CR 3 MG TBCR Take by mouth at bedtime.  . Multiple Vitamin (MULTIVITAMIN WITH MINERALS) TABS tablet Take 1 tablet by mouth daily.  . Nutritional Supplements (DHEA PO) Take 8 mg by mouth.   Marland Kitchen OVER THE COUNTER MEDICATION Take 2 tablets by mouth 2 (two) times daily. Hepatrope 2 for liver support  . OVER THE COUNTER MEDICATION Take 1 tablet by mouth daily. InflaMed for liver support  . potassium gluconate 595 MG TABS tablet Take 595 mg by mouth daily.  . progesterone (PROMETRIUM) 100 MG capsule Take 100 mg by mouth daily.  Marland Kitchen thyroid (ARMOUR) 90 MG tablet Take 90 mg by mouth every morning.   Marland Kitchen Ubiquinol 100 MG CAPS Take 1 capsule by mouth daily.  . valsartan-hydrochlorothiazide (DIOVAN-HCT) 320-25 MG per tablet Take 1 tablet by mouth daily.  . verapamil (CALAN-SR) 120 MG CR tablet Take 1 tablet (120 mg total) by mouth at bedtime.  Marland Kitchen VITAMIN K PO Take 400 mcg by mouth daily.  . [DISCONTINUED] valsartan-hydrochlorothiazide  (DIOVAN-HCT) 320-25 MG per tablet   . [DISCONTINUED] valsartan-hydrochlorothiazide (DIOVAN-HCT) 320-25 MG per tablet TAKE 1 TABLET BY MOUTH EVERY DAY  . [DISCONTINUED] verapamil (CALAN-SR) 120 MG CR tablet TAKE 1 TABLET BY MOUTH AT BEDTIME    EXAM:  BP 138/80 mmHg  Temp(Src) 97.9 F (36.6 C) (Oral)  Ht 5' 5.5" (1.664 m)  Wt 232 lb 6.4  oz (105.416 kg)  BMI 38.07 kg/m2  Body mass index is 38.07 kg/(m^2).  Physical Exam: Vital signs reviewed CWC:BJSE is a well-developed well-nourished alert cooperative    who appearsr stated age in no acute distress.  HEENT: normocephalic atraumatic , Eyes: PERRL EOM's full, conjunctiva clear, Nares: paten,t no deformity discharge or tenderness., Ears: no deformity EAC's clear TMs with normal landmarks. Mouth: clear OP, no lesions, edema.  Moist mucous membranes. Dentition in adequate repair. NECK: supple without masses, thyromegaly or bruits. CHEST/PULM:  Clear to auscultation and percussion breath sounds equal no wheeze , rales or rhonchi. No chest wall deformities or tenderness. CV: PMI is nondisplaced, S1 S2 no gallops, murmurs, rubs. Peripheral pulses are full without delay.No JVDrepeat blood pressure improved . Breast: normal by inspection . No dimpling, discharge, masses, tenderness or discharge . ABDOMEN: Bowel sounds normal nontender  No guard or rebound, no hepato splenomegal no CVA tenderness.  No hernia. Extremtities:  No clubbing cyanosis or edema, no acute joint swelling or redness no focal atrophy decrease range of motion left shoulder NEURO:  Oriented x3, cranial nerves 3-12 appear to be intact, no obvious focal weakness,gait within normal limits no abnormal reflexes or asymmetrical SKIN: No acute rashes normal turgor, color, no bruising or petechiae. PSYCH: Oriented, good eye contact, no obvious depression anxiety, cognition and judgment appear normal. LN: no cervical axillary inguinal adenopathy  Lab Results  Component Value Date   WBC  9.2 03/21/2014   HGB 13.5 03/21/2014   HCT 41.1 03/21/2014   PLT 209.0 03/21/2014   GLUCOSE 90 03/21/2014   CHOL 249* 03/21/2014   TRIG 52.0 03/21/2014   HDL 59.70 03/21/2014   LDLDIRECT 165.2 12/27/2012   LDLCALC 179* 03/21/2014   ALT 33 03/21/2014   AST 24 03/21/2014   NA 140 03/21/2014   K 3.8 03/21/2014   CL 104 03/21/2014   CREATININE 0.8 03/21/2014   BUN 22 03/21/2014   CO2 28 03/21/2014   TSH 0.80 03/21/2014   HGBA1C 5.2 06/01/2013   BP Readings from Last 3 Encounters:  03/28/14 138/80  12/13/13 136/70  11/28/13 110/78   Wt Readings from Last 3 Encounters:  03/28/14 232 lb 6.4 oz (105.416 kg)  12/13/13 226 lb (102.513 kg)  11/30/13 226 lb (102.513 kg)    ASSESSMENT AND PLAN:  Discussed the following assessment and plan:  Visit for preventive health examination  Labile hypertension - had been controlled up today better on repeat monitor at home lifestyle intervention continue medicine  Hyperlipidemia  Need for Tdap vaccination - Plan: Tdap vaccine greater than or equal to 7yo IM  Influenza vaccination declined by patient Discussion agree with reinstitution her healthy eating weight loss which will help her health and her energy level. Discussed shingles vaccine can make appointment for injection only if needed Patient Care Team: Burnis Medin, MD as PCP - General Fay Records, MD (Cardiology) Kathee Delton, MD (Pulmonary Disease) Darlyn Chamber, MD as Consulting Physician (Obstetrics and Gynecology) Dr A  Patient Instructions   Healthy lifestyle includes : At least 150 minutes of exercise weeks  , weight at healthy levels, which is usually   BMI 19-25. Avoid trans fats and processed foods;  Increase fresh fruits and veges to 5 servings per day. And avoid sweet beverages including tea and juice. Mediterranean diet with olive oil and nuts have been noted to be heart and brain healthy . Avoid tobacco products . Limit  alcohol to  7 per week for women  and  14 servings for men.  Get adequate sleep . Wear seat belts . Don't text and drive .   CV risk assessment  5.6 % 10 year risk at this time Check on shingles vaccine  Check into shingles vaccine ( Zostavax) reimbursement or cost to you  and can return at any time if call ahead for injection. Yearly wellenss labs and med check     Standley Brooking. Panosh M.D.

## 2014-03-28 NOTE — Patient Instructions (Addendum)
  Healthy lifestyle includes : At least 150 minutes of exercise weeks  , weight at healthy levels, which is usually   BMI 19-25. Avoid trans fats and processed foods;  Increase fresh fruits and veges to 5 servings per day. And avoid sweet beverages including tea and juice. Mediterranean diet with olive oil and nuts have been noted to be heart and brain healthy . Avoid tobacco products . Limit  alcohol to  7 per week for women and 14 servings for men.  Get adequate sleep . Wear seat belts . Don't text and drive .   CV risk assessment  5.6 % 10 year risk at this time Check on shingles vaccine  Check into shingles vaccine ( Zostavax) reimbursement or cost to you  and can return at any time if call ahead for injection. Yearly wellenss labs and med check

## 2014-08-02 ENCOUNTER — Ambulatory Visit (INDEPENDENT_AMBULATORY_CARE_PROVIDER_SITE_OTHER): Payer: BC Managed Care – PPO | Admitting: Internal Medicine

## 2014-08-02 ENCOUNTER — Encounter: Payer: Self-pay | Admitting: Internal Medicine

## 2014-08-02 VITALS — BP 148/78 | Temp 98.0°F | Ht 65.5 in | Wt 225.0 lb

## 2014-08-02 DIAGNOSIS — R945 Abnormal results of liver function studies: Secondary | ICD-10-CM

## 2014-08-02 DIAGNOSIS — Z23 Encounter for immunization: Secondary | ICD-10-CM

## 2014-08-02 DIAGNOSIS — R1012 Left upper quadrant pain: Secondary | ICD-10-CM | POA: Diagnosis not present

## 2014-08-02 DIAGNOSIS — I10 Essential (primary) hypertension: Secondary | ICD-10-CM

## 2014-08-02 DIAGNOSIS — R7989 Other specified abnormal findings of blood chemistry: Secondary | ICD-10-CM | POA: Diagnosis not present

## 2014-08-02 DIAGNOSIS — R748 Abnormal levels of other serum enzymes: Secondary | ICD-10-CM | POA: Diagnosis not present

## 2014-08-02 DIAGNOSIS — K760 Fatty (change of) liver, not elsewhere classified: Secondary | ICD-10-CM

## 2014-08-02 NOTE — Progress Notes (Signed)
Pre visit review using our clinic review tool, if applicable. No additional management support is needed unless otherwise documented below in the visit note.  Chief Complaint  Patient presents with  . Abdominal Pain    X1 Month    HPI: Patient [name]  comes in today for SDA for  problem evaluation. 59 y.o. with ht hypothyroid   Hx of fatty liver on [place]  Years ago who is  Seeing Dr Judeen Hammans  Alternative md (holistic  FP. And did labs with abnormal lfts  Poss fatty liver has twinge luq epig pain off and on  Was to repeat labs but comes her to evaluated in stead Labs show ot/ pt 38/64 alk phos 118  3 21  Vit b shots   Cause of fatigue  Not exercising  Some weight  Gain.   Healthy oils and  Vegges.  ocass prickiy left upper abd pain  positional    Neg h pylori breat tests .  Has HH .Marland Kitchen. BP up  Again had been better  Still on workers comp  Pain neck  To hand  On may supplements   Went off healty diet in past few months  Had gotten down to 211 weight and felt good at that time .   ROS: See pertinent positives and negatives per HPI.  Past Medical History  Diagnosis Date  . Depression   . Hypothyroidism   . Headache(784.0)     hx of migraines when younger  . Fibromyalgia   . Hypertension   . Heart palpitations     hx with normal holter event monitoring  . Hx: UTI (urinary tract infection)   . Skin cancer     basal, squamous cell  . Atrial septal aneurysm / if pfo  echo 6 13  10/23/2011  . Fatty liver     "pre fatty liver"  . Pneumonia 1972    hx of  . GERD (gastroesophageal reflux disease)     hx of, none in a long time    Family History  Problem Relation Age of Onset  . Hypertension Mother     low borderline  . Hypertension Father   . Liver disease Father     amyloid deceased  . Diabetes Daughter   . ADD / ADHD    . Hyperlipidemia      Maternal grandmother  . Osteoporosis    . Melanoma Sister   . Colon cancer Neg Hx   . Stomach cancer Neg Hx      History   Social History  . Marital Status: Married    Spouse Name: N/A  . Number of Children: Y  . Years of Education: N/A   Occupational History  . TEACHER [employer]    Social History Main Topics  . Smoking status: Never Smoker   . Smokeless tobacco: Never Used  . Alcohol Use: Yes     Comment: rare  . Drug Use: No  . Sexual Activity: Not on file   Other Topics Concern  . 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 15 left shoulder surgery    Married   Special educ   HH of 4   3 outside dogs, 8 goats, 40 chickens and quail. [place]   [place]   Ex husband passes away                Outpatient Encounter  Prescriptions as of 08/02/2014  Medication Sig  . Ascorbic Acid (VITAMIN C) 1000 MG tablet Take 1,000 mg by mouth daily.  . Cholecalciferol (VITAMIN D3) 10000 UNITS capsule Take 10,000 Units by mouth daily.  Marland Kitchen DHEA 10 MG TABS Take by mouth.  . fluticasone (FLONASE) 50 MCG/ACT nasal spray   . Iodine, Kelp, TABS Take 1 tablet by mouth daily.  Javier Docker Oil 1000 MG CAPS Take 1 capsule by mouth 2 (two) times daily.  . Magnesium 250 MG TABS Take 2 tablets by mouth daily.  . Multiple Vitamin (MULTIVITAMIN WITH MINERALS) TABS tablet Take 1 tablet by mouth daily.  Marland Kitchen NATURE-THROID 16.25 MG TABS   . NATURE-THROID 97.5 MG TABS   . NON FORMULARY Similace for dietary health once with each meal  . OVER THE COUNTER MEDICATION Take 2 tablets by mouth 2 (two) times daily. Hepatrope 2 for liver support  . potassium gluconate 595 MG TABS tablet Take 595 mg by mouth daily.  . Progesterone Micronized (PROGESTERONE PO) Take 112.5 mg by mouth daily.  Marland Kitchen Ubiquinol 100 MG CAPS Take 1 capsule by mouth daily.  . valsartan-hydrochlorothiazide (DIOVAN-HCT) 320-25 MG per tablet Take 1 tablet by mouth daily.  . verapamil (CALAN-SR) 120 MG CR tablet Take 1 tablet (120 mg total) by mouth at bedtime.  Marland Kitchen VITAMIN K PO Take  400 mcg by mouth daily.  Marland Kitchen acidophilus (RISAQUAD) CAPS capsule Take 2 capsules by mouth daily.  . [DISCONTINUED] estradiol (ESTRACE) 0.1 MG/GM vaginal cream Place 1 Applicatorful vaginally at bedtime.  . [DISCONTINUED] Ginger, Zingiber officinalis, (GINGER ROOT) 550 MG CAPS Take 1 capsule by mouth daily.  . [DISCONTINUED] Melatonin CR 3 MG TBCR Take by mouth at bedtime.  . [DISCONTINUED] Nutritional Supplements (DHEA PO) Take 8 mg by mouth.   . [DISCONTINUED] OVER THE COUNTER MEDICATION Take 1 tablet by mouth daily. InflaMed for liver support  . [DISCONTINUED] progesterone (PROMETRIUM) 100 MG capsule Take 100 mg by mouth daily.  . [DISCONTINUED] thyroid (ARMOUR) 90 MG tablet Take 90 mg by mouth every morning.     EXAM:  BP 148/78 mmHg  Temp(Src) 98 F (36.7 C) (Oral)  Ht 5' 5.5" (1.664 m)  Wt 225 lb (102.059 kg)  BMI 36.86 kg/m2  Body mass index is 36.86 kg/(m^2).  GENERAL: vitals reviewed and listed above, alert, oriented, appears well hydrated and in no acute distress HEENT: atraumatic, conjunctiva  clear, no obvious abnormalities on inspection of external nose and ears OP : no lesion edema or exudate  NECK: no obvious masses on inspection palpation  LUNGS: clear to auscultation bilaterally, no wheezes, rales or rhonchi, good air movement Abdomen:  Sof,t normal bowel sounds without hepatosplenomegaly, no guarding rebound or masses no CVA tenderness CV: HRRR, no clubbing cyanosis or  peripheral edema nl cap refill   PSYCH: pleasant and cooperative, no obvious depression or anxiety Wt Readings from Last 3 Encounters:  08/02/14 225 lb (102.059 kg)  03/28/14 232 lb 6.4 oz (105.416 kg)  12/13/13 226 lb (102.513 kg)   Lab Results  Component Value Date   WBC 9.2 03/21/2014   HGB 13.5 03/21/2014   HCT 41.1 03/21/2014   PLT 209.0 03/21/2014   GLUCOSE 90 03/21/2014   CHOL 249* 03/21/2014   TRIG 52.0 03/21/2014   HDL 59.70 03/21/2014   LDLDIRECT 165.2 12/27/2012   LDLCALC 179*  03/21/2014   ALT 33 03/21/2014   AST 24 03/21/2014   NA 140 03/21/2014   K 3.8 03/21/2014   CL 104  03/21/2014   CREATININE 0.8 03/21/2014   BUN 22 03/21/2014   CO2 28 03/21/2014   TSH 0.80 03/21/2014   HGBA1C 5.2 06/01/2013    ASSESSMENT AND PLAN:  Discussed the following assessment and plan: Father  With amyloid liver .  Abnormal LFTs - Plan: US Abdomen Complete, Hepatic function panel, Ceruloplasmin, Gamma GT, Hepatitis C antibody, IBC panel, Ferritin, ANA, Nucleotidase, 5', blood, Vit D  25 hydroxy (rtn osteoporosis monitoring), Mitochondrial antibodies, C-reactive protein  Essential hypertension - had been controlled  up now  inc weight dec exercise  Left upper quadrant pain - Plan: US Abdomen Complete, Hepatic function panel, Ceruloplasmin, Gamma GT, Hepatitis C antibody, IBC panel, Ferritin, ANA, Nucleotidase, 5', blood, Vit D  25 hydroxy (rtn osteoporosis monitoring), Mitochondrial antibodies, C-reactive protein  Elevated alkaline phosphatase level - Plan: US Abdomen Complete, Hepatic function panel, Ceruloplasmin, Gamma GT, Hepatitis C antibody, IBC panel, Ferritin, ANA, Nucleotidase, 5', blood, Vit D  25 hydroxy (rtn osteoporosis monitoring), Mitochondrial antibodies, C-reactive protein  Need for hepatitis A and B vaccination - Plan: Hepatitis A hepatitis B combined vaccine IM  Fatty liver - by [place] in past  - Plan: Hepatic function panel, Ceruloplasmin, Gamma GT, Hepatitis C antibody, IBC panel, Ferritin, ANA, Nucleotidase, 5', blood, Vit D  25 hydroxy (rtn osteoporosis monitoring), Mitochondrial antibodies, C-reactive protein Advised trial off all supplements    Incase adding to any problem  Getting b 12 shots for fatigue  With out dx of b12 deficiency noted  Father had amyloid liver  Disc importance of lsi     Consider gi consult ? If role for vit d etc  Plan labs and fu after [place].  Back  -Patient advised to return or notify health care team  if symptoms worsen ,persist or new  concerns arise.  Patient Instructions  This could be fatty liver but  Needs fu and evaluation.  Take blood pressure readings twice a day for 10 days and then periodically .To ensure below 140/90   .Send in readings    .  Mediterranean diet  Hepatitis vaccine series  Twinrix.  Will review records about other labs to do . Next week.  i advise considiering stopping supplements  To make sure not causing  Liver changes . Will arrange a liver  ultrasound .   Standley Brooking. Connery Shiffler M.D.

## 2014-08-02 NOTE — Patient Instructions (Signed)
This could be fatty liver but  Needs fu and evaluation.  Take blood pressure readings twice a day for 10 days and then periodically .To ensure below 140/90   .Send in readings    .  Mediterranean diet  Hepatitis vaccine series  Twinrix.  Will review records about other labs to do . Next week.  i advise considiering stopping supplements  To make sure not causing  Liver changes . Will arrange a liver  ultrasound .

## 2014-08-10 ENCOUNTER — Other Ambulatory Visit (INDEPENDENT_AMBULATORY_CARE_PROVIDER_SITE_OTHER): Payer: BC Managed Care – PPO

## 2014-08-10 DIAGNOSIS — K760 Fatty (change of) liver, not elsewhere classified: Secondary | ICD-10-CM | POA: Diagnosis not present

## 2014-08-10 DIAGNOSIS — R748 Abnormal levels of other serum enzymes: Secondary | ICD-10-CM

## 2014-08-10 DIAGNOSIS — R945 Abnormal results of liver function studies: Secondary | ICD-10-CM

## 2014-08-10 DIAGNOSIS — R7989 Other specified abnormal findings of blood chemistry: Secondary | ICD-10-CM

## 2014-08-10 DIAGNOSIS — R1012 Left upper quadrant pain: Secondary | ICD-10-CM | POA: Diagnosis not present

## 2014-08-10 LAB — HEPATIC FUNCTION PANEL
ALBUMIN: 4.3 g/dL (ref 3.5–5.2)
ALT: 36 U/L — AB (ref 0–35)
AST: 26 U/L (ref 0–37)
Alkaline Phosphatase: 107 U/L (ref 39–117)
BILIRUBIN DIRECT: 0.1 mg/dL (ref 0.0–0.3)
Total Bilirubin: 0.6 mg/dL (ref 0.2–1.2)
Total Protein: 7.3 g/dL (ref 6.0–8.3)

## 2014-08-10 LAB — VITAMIN D 25 HYDROXY (VIT D DEFICIENCY, FRACTURES): VITD: 37.07 ng/mL (ref 30.00–100.00)

## 2014-08-10 LAB — IBC PANEL
Iron: 80 ug/dL (ref 42–145)
Saturation Ratios: 22.9 % (ref 20.0–50.0)
TRANSFERRIN: 249 mg/dL (ref 212.0–360.0)

## 2014-08-10 LAB — FERRITIN: Ferritin: 69.2 ng/mL (ref 10.0–291.0)

## 2014-08-10 LAB — C-REACTIVE PROTEIN: CRP: 0.8 mg/dL (ref 0.5–20.0)

## 2014-08-10 LAB — GAMMA GT: GGT: 54 U/L — ABNORMAL HIGH (ref 7–51)

## 2014-08-11 LAB — ANA: ANA: NEGATIVE

## 2014-08-11 LAB — HEPATITIS C ANTIBODY: HCV Ab: NEGATIVE

## 2014-08-12 LAB — NUCLEOTIDASE, 5', BLOOD: 5-Nucleotidase: 6 U/L (ref <11)

## 2014-08-14 LAB — CERULOPLASMIN: CERULOPLASMIN: 32 mg/dL (ref 18–53)

## 2014-08-16 LAB — MITOCHONDRIAL ANTIBODIES: Mitochondrial M2 Ab, IgG: 0.92 — ABNORMAL HIGH (ref <0.91)

## 2014-08-25 ENCOUNTER — Ambulatory Visit
Admission: RE | Admit: 2014-08-25 | Discharge: 2014-08-25 | Disposition: A | Discharge status: Home / Self Care | Payer: BC Managed Care – PPO | Source: Ambulatory Visit | Attending: Internal Medicine | Admitting: Internal Medicine

## 2014-08-25 DIAGNOSIS — R1012 Left upper quadrant pain: Secondary | ICD-10-CM

## 2014-08-25 DIAGNOSIS — R7989 Other specified abnormal findings of blood chemistry: Secondary | ICD-10-CM

## 2014-08-25 DIAGNOSIS — R945 Abnormal results of liver function studies: Secondary | ICD-10-CM

## 2014-08-25 DIAGNOSIS — R748 Abnormal levels of other serum enzymes: Secondary | ICD-10-CM

## 2014-08-29 ENCOUNTER — Ambulatory Visit (INDEPENDENT_AMBULATORY_CARE_PROVIDER_SITE_OTHER): Payer: BC Managed Care – PPO | Admitting: Internal Medicine

## 2014-08-29 ENCOUNTER — Encounter: Payer: Self-pay | Admitting: Internal Medicine

## 2014-08-29 VITALS — BP 124/84 | Temp 98.8°F | Ht 65.5 in | Wt 222.7 lb

## 2014-08-29 DIAGNOSIS — R7989 Other specified abnormal findings of blood chemistry: Secondary | ICD-10-CM

## 2014-08-29 DIAGNOSIS — K760 Fatty (change of) liver, not elsewhere classified: Secondary | ICD-10-CM

## 2014-08-29 DIAGNOSIS — R894 Abnormal immunological findings in specimens from other organs, systems and tissues: Secondary | ICD-10-CM

## 2014-08-29 DIAGNOSIS — R945 Abnormal results of liver function studies: Secondary | ICD-10-CM

## 2014-08-29 DIAGNOSIS — I10 Essential (primary) hypertension: Secondary | ICD-10-CM

## 2014-08-29 DIAGNOSIS — R760 Raised antibody titer: Secondary | ICD-10-CM

## 2014-08-29 NOTE — Progress Notes (Signed)
Pre visit review using our clinic review tool, if applicable. No additional management support is needed unless otherwise documented below in the visit note.  Chief Complaint  Patient presents with  . Follow-up    HPI: Patient [name]  comes in today for SDA for   problem evaluation. Fu resouts of labs and [place]  Father died of amyloid of liver not felt to be genetic , Trying to do healthy eating  And activity   Has some bp readings   Usually 130 rangae  Once had 107 with pulse in 107 range  ROS: See pertinent positives and negatives per HPI. See last note no change  Past Medical History  Diagnosis Date  . Depression   . Hypothyroidism   . Headache(784.0)     hx of migraines when younger  . Fibromyalgia   . Hypertension   . Heart palpitations     hx with normal holter event monitoring  . Hx: UTI (urinary tract infection)   . Skin cancer     basal, squamous cell  . Atrial septal aneurysm / if pfo  echo 6 13  10/23/2011  . Fatty liver     "pre fatty liver"  . Pneumonia 1972    hx of  . GERD (gastroesophageal reflux disease)     hx of, none in a long time    Family History  Problem Relation Age of Onset  . Hypertension Mother     low borderline  . Hypertension Father   . Liver disease Father     amyloid deceased  . Diabetes Daughter   . ADD / ADHD    . Hyperlipidemia      Maternal grandmother  . Osteoporosis    . Melanoma Sister   . Colon cancer Neg Hx   . Stomach cancer Neg Hx     History   Social History  . Marital Status: Married    Spouse Name: N/A  . Number of Children: Y  . Years of Education: N/A   Occupational History  . TEACHER [employer]    Social History Main Topics  . Smoking status: Never Smoker   . Smokeless tobacco: Never Used  . Alcohol Use: Yes     Comment: rare  . Drug Use: No  . Sexual Activity: Not on file   Other Topics Concern  . 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 15 left shoulder surgery    Married   Special educ   HH of 4   3 outside dogs, 8 goats, 40 chickens and quail. [place]   [place]   Ex husband passes away                Outpatient Encounter Prescriptions as of 08/29/2014  Medication Sig  . acidophilus (RISAQUAD) CAPS capsule Take 2 capsules by mouth daily.  . Ascorbic Acid (VITAMIN C) 1000 MG tablet Take 1,000 mg by mouth daily.  . Cholecalciferol (VITAMIN D3) 10000 UNITS capsule Take 10,000 Units by mouth daily.  Marland Kitchen DHEA 10 MG TABS Take by mouth.  . fluticasone (FLONASE) 50 MCG/ACT nasal spray   . Iodine, Kelp, TABS Take 1 tablet by mouth daily.  Javier Docker Oil 1000 MG CAPS Take 1 capsule by mouth 2 (two) times daily.  . Magnesium 250 MG TABS Take 2 tablets by mouth daily.  . Multiple Vitamin (MULTIVITAMIN WITH MINERALS) TABS tablet Take  1 tablet by mouth daily.  Marland Kitchen NATURE-THROID 16.25 MG TABS   . NATURE-THROID 97.5 MG TABS   . NON FORMULARY Similace for dietary health once with each meal  . OVER THE COUNTER MEDICATION Take 2 tablets by mouth 2 (two) times daily. Hepatrope 2 for liver support  . potassium gluconate 595 MG TABS tablet Take 595 mg by mouth daily.  . Progesterone Micronized (PROGESTERONE PO) Take 112.5 mg by mouth daily.  Marland Kitchen Ubiquinol 100 MG CAPS Take 1 capsule by mouth daily.  . valsartan-hydrochlorothiazide (DIOVAN-HCT) 320-25 MG per tablet Take 1 tablet by mouth daily.  . verapamil (CALAN-SR) 120 MG CR tablet Take 1 tablet (120 mg total) by mouth at bedtime.  Marland Kitchen VITAMIN K PO Take 400 mcg by mouth daily.    EXAM:  BP 124/84 mmHg  Temp(Src) 98.8 F (37.1 C) (Oral)  Ht 5' 5.5" (1.664 m)  Wt 222 lb 11.2 oz (101.016 kg)  BMI 36.48 kg/m2  Body mass index is 36.48 kg/(m^2).  GENERAL: vitals reviewed and listed above, alert, oriented, appears well hydrated and in no acute distress HEENT: atraumatic, conjunctiva  clear, no obvious abnormalities on inspection of external nose and  ears MS: moves all extremities without noticeable focal  abnormality PSYCH: pleasant and cooperative, no obvious depression or anxiety Lab Results  Component Value Date   WBC 9.2 03/21/2014   HGB 13.5 03/21/2014   HCT 41.1 03/21/2014   PLT 209.0 03/21/2014   GLUCOSE 90 03/21/2014   CHOL 249* 03/21/2014   TRIG 52.0 03/21/2014   HDL 59.70 03/21/2014   LDLDIRECT 165.2 12/27/2012   LDLCALC 179* 03/21/2014   ALT 36* 08/10/2014   AST 26 08/10/2014   NA 140 03/21/2014   K 3.8 03/21/2014   CL 104 03/21/2014   CREATININE 0.8 03/21/2014   BUN 22 03/21/2014   CO2 28 03/21/2014   TSH 0.80 03/21/2014   HGBA1C 5.2 06/01/2013    ASSESSMENT AND PLAN:  Discussed the following assessment and plan:  Abnormal LFTs  Fatty liver  Abnormal antibody titer - borderline ama  Essential hypertension - ocass lows ok at home contact  iof not in range  Continued minor but persistent abnormalities and non homogenous liver US .  Poss fatty liver father died of amyloid liver   ama borderline  Level  Past hx of elevated alk phos and ggt but nl 5 "nucelotidase at this time.   No other findings of PBC  however  Get gi opinion about the liver  To decide if anything e else should be followed or  Treated.   -Patient advised to return or notify health care team  if symptoms worsen ,persist or new concerns arise.  Patient Instructions  Labs have minor liver abnormality .   Persistent still could be fatty.   Liver still important.   Will arrange gi referral opinion.   Continue healthy parameteres .     Standley Brooking. Panosh M.D.

## 2014-08-29 NOTE — Patient Instructions (Addendum)
Labs have minor liver abnormality .   Persistent still could be fatty.   Liver still important.   Will arrange gi referral opinion.   Continue healthy parameteres .

## 2014-08-30 ENCOUNTER — Encounter: Payer: Self-pay | Admitting: Gastroenterology

## 2014-08-30 ENCOUNTER — Telehealth: Payer: Self-pay | Admitting: Family Medicine

## 2014-08-30 NOTE — Telephone Encounter (Signed)
Patient needs GI referral.  Are we sending her to a specific physician?

## 2014-08-30 NOTE — Telephone Encounter (Signed)
Iput in order not sure who yet

## 2014-09-02 ENCOUNTER — Other Ambulatory Visit: Payer: Self-pay | Admitting: Internal Medicine

## 2014-09-04 NOTE — Telephone Encounter (Signed)
Denied. Filled for 1 year in Nov 2015.

## 2014-09-15 ENCOUNTER — Telehealth: Payer: Self-pay | Admitting: Internal Medicine

## 2014-09-15 NOTE — Telephone Encounter (Signed)
FYI pt wanted dr Regis Bill to know dr stark can not see her until 11-06-14 is that ok. Pt is on waiting list

## 2014-09-17 NOTE — Telephone Encounter (Signed)
I think that it currently safe to wait until appt    In June   Let us know if other issues come up before then .

## 2014-09-18 ENCOUNTER — Ambulatory Visit: Payer: BC Managed Care – PPO | Admitting: Family Medicine

## 2014-09-18 NOTE — Telephone Encounter (Signed)
Left a message on listed number informing the pt of message from [facility].  Instructed her to call back if she has any questions.

## 2014-09-20 ENCOUNTER — Ambulatory Visit (INDEPENDENT_AMBULATORY_CARE_PROVIDER_SITE_OTHER): Payer: BC Managed Care – PPO | Admitting: Family Medicine

## 2014-09-20 DIAGNOSIS — Z23 Encounter for immunization: Secondary | ICD-10-CM | POA: Diagnosis not present

## 2014-10-26 ENCOUNTER — Encounter: Payer: Self-pay | Admitting: Gastroenterology

## 2014-11-06 ENCOUNTER — Ambulatory Visit: Payer: Self-pay | Admitting: Gastroenterology

## 2014-11-27 ENCOUNTER — Ambulatory Visit (INDEPENDENT_AMBULATORY_CARE_PROVIDER_SITE_OTHER): Payer: BC Managed Care – PPO | Admitting: Gastroenterology

## 2014-11-27 ENCOUNTER — Encounter: Payer: Self-pay | Admitting: Gastroenterology

## 2014-11-27 VITALS — BP 130/70 | HR 76 | Ht 65.0 in | Wt 218.0 lb

## 2014-11-27 DIAGNOSIS — K760 Fatty (change of) liver, not elsewhere classified: Secondary | ICD-10-CM | POA: Diagnosis not present

## 2014-11-27 DIAGNOSIS — Z86010 Personal history of colonic polyps: Secondary | ICD-10-CM | POA: Diagnosis not present

## 2014-11-27 DIAGNOSIS — R945 Abnormal results of liver function studies: Principal | ICD-10-CM

## 2014-11-27 DIAGNOSIS — R7989 Other specified abnormal findings of blood chemistry: Secondary | ICD-10-CM | POA: Diagnosis not present

## 2014-11-27 NOTE — Progress Notes (Signed)
    History of Present Illness: This is a 59 year-old female referred by Dr. Regis Bill for evaluation of abnormal LFTs. She is accompanied by her husband. Abdominal ultrasound in September 2013 showed changes consistent with fatty liver. Abdominal ultrasound in April 2016 showed an inhomogeneous liver echotexture without focal abnormality or intrahepatic ductal dilatation. Blood work in March 2016 showed AST=38, ALT=54 and alkaline phosphatase=118, all mildly elevated. She began what she describes as a paleo diet and lost several pounds. Repeat LFTs in June 2016 were normal. Iron studies, ferritin, ANA, ceruloplasmin and HCV antibody were all negative. Her mitochondrial antibody was minimally elevated at a titer of 0.92 with abnormal being above 0.91.   Current Medications, Allergies, Past Medical History, Past Surgical History, Family History and Social History were reviewed in [employer] record.  Physical Exam: General: Well developed , well nourished, no acute distress Head: Normocephalic and atraumatic Eyes:  sclerae anicteric, EOMI Ears: Normal auditory acuity Mouth: No deformity or lesions Lungs: Clear throughout to auscultation Heart: Regular rate and rhythm; no murmurs, rubs or bruits Abdomen: Soft, non tender and non distended. No masses, hepatosplenomegaly or hernias noted. Normal Bowel sounds Musculoskeletal: Symmetrical with no gross deformities  Pulses:  Normal pulses noted Extremities: No clubbing, cyanosis, edema or deformities noted Neurological: Alert oriented x 4, grossly nonfocal Psychological:  Alert and cooperative. Normal mood and affect  Assessment and Recommendations:  1. Hepatic steatosis is the likely cause of mildly elevated LFTs. Borderline elevated AMA is likely clinically insignificant. Advised to continue her current diet. She is advised to follow a long-term weight loss program with a heart healthy diet to achieve a normal BMI supervised by  her PCP. Monitor LFTs about every 6 months per her PCP. If her LFTs remain persistently elevated will pursue further hepatic evaluation.  2. Personal history of adenomatous colon polyps. 3 year interval surveillance colonoscopy recommended in August 2018.

## 2014-11-27 NOTE — Patient Instructions (Addendum)
Follow up with your Primary Care physician.   Thank you for choosing me and [facility].  Pricilla Riffle. Dagoberto Ligas., MD., Marval Regal  cc: Shanon Ace, MD

## 2015-05-25 ENCOUNTER — Other Ambulatory Visit: Payer: Self-pay | Admitting: Internal Medicine

## 2015-05-25 ENCOUNTER — Other Ambulatory Visit: Payer: Self-pay | Admitting: Family Medicine

## 2015-05-25 ENCOUNTER — Telehealth: Payer: Self-pay | Admitting: Family Medicine

## 2015-05-25 DIAGNOSIS — Z0000 Encounter for general adult medical examination without abnormal findings: Secondary | ICD-10-CM

## 2015-05-25 NOTE — Telephone Encounter (Signed)
Sent to the pharmacy by e-scribe.  Message sent to scheduling to help pt make lab and cpx appt.

## 2015-05-25 NOTE — Telephone Encounter (Signed)
Pt has been sch

## 2015-05-25 NOTE — Telephone Encounter (Signed)
Pt is past due for lab work and cpx.  Please help her to make both appointments.  Orders placed in the system.  Please do "something special for her."  Use [place] slot if needed.  Last cpx was 03/2014

## 2015-06-13 ENCOUNTER — Other Ambulatory Visit (INDEPENDENT_AMBULATORY_CARE_PROVIDER_SITE_OTHER): Payer: BC Managed Care – PPO

## 2015-06-13 DIAGNOSIS — Z0000 Encounter for general adult medical examination without abnormal findings: Secondary | ICD-10-CM

## 2015-06-13 LAB — BASIC METABOLIC PANEL
BUN: 17 mg/dL (ref 6–23)
CHLORIDE: 104 meq/L (ref 96–112)
CO2: 29 mEq/L (ref 19–32)
Calcium: 9.7 mg/dL (ref 8.4–10.5)
Creatinine, Ser: 0.77 mg/dL (ref 0.40–1.20)
GFR: 80.89 mL/min (ref >60.00)
Glucose, Bld: 92 mg/dL (ref 70–99)
POTASSIUM: 4.7 meq/L (ref 3.5–5.1)
Sodium: 141 mEq/L (ref 135–145)

## 2015-06-13 LAB — CBC WITH DIFFERENTIAL/PLATELET
BASOS PCT: 0.4 % (ref 0.0–3.0)
Basophils Absolute: 0 10*3/uL (ref 0.0–0.1)
EOS PCT: 2.1 % (ref 0.0–5.0)
Eosinophils Absolute: 0.2 10*3/uL (ref 0.0–0.7)
HEMATOCRIT: 41.7 % (ref 36.0–46.0)
HEMOGLOBIN: 13.6 g/dL (ref 12.0–15.0)
LYMPHS PCT: 42.7 % (ref 12.0–46.0)
Lymphs Abs: 3.8 10*3/uL (ref 0.7–4.0)
MCHC: 32.5 g/dL (ref 30.0–36.0)
MCV: 91.5 fl (ref 78.0–100.0)
Monocytes Absolute: 0.4 10*3/uL (ref 0.1–1.0)
Monocytes Relative: 4.7 % (ref 3.0–12.0)
Neutro Abs: 4.4 10*3/uL (ref 1.4–7.7)
Neutrophils Relative %: 50.1 % (ref 43.0–77.0)
Platelets: 260 10*3/uL (ref 150.0–400.0)
RBC: 4.55 Mil/uL (ref 3.87–5.11)
RDW: 13.8 % (ref 11.5–15.5)
WBC: 8.9 10*3/uL (ref 4.0–10.5)

## 2015-06-13 LAB — HEPATIC FUNCTION PANEL
ALT: 28 U/L (ref 0–35)
AST: 20 U/L (ref 0–37)
Albumin: 4.2 g/dL (ref 3.5–5.2)
Alkaline Phosphatase: 108 U/L (ref 39–117)
BILIRUBIN TOTAL: 0.5 mg/dL (ref 0.2–1.2)
Bilirubin, Direct: 0.1 mg/dL (ref 0.0–0.3)
Total Protein: 6.6 g/dL (ref 6.0–8.3)

## 2015-06-13 LAB — TSH: TSH: 1.59 u[IU]/mL (ref 0.35–4.50)

## 2015-06-13 LAB — LIPID PANEL
CHOL/HDL RATIO: 4
Cholesterol: 243 mg/dL — ABNORMAL HIGH (ref 0–200)
HDL: 59.4 mg/dL (ref >39.00)
LDL CALC: 172 mg/dL — AB (ref 0–99)
NonHDL: 183.58
Triglycerides: 59 mg/dL (ref 0.0–149.0)
VLDL: 11.8 mg/dL (ref 0.0–40.0)

## 2015-06-20 ENCOUNTER — Ambulatory Visit (INDEPENDENT_AMBULATORY_CARE_PROVIDER_SITE_OTHER): Payer: BC Managed Care – PPO | Admitting: Internal Medicine

## 2015-06-20 ENCOUNTER — Encounter: Payer: Self-pay | Admitting: Internal Medicine

## 2015-06-20 VITALS — BP 136/76 | Temp 98.6°F | Ht 65.0 in | Wt 206.0 lb

## 2015-06-20 DIAGNOSIS — R7989 Other specified abnormal findings of blood chemistry: Secondary | ICD-10-CM | POA: Diagnosis not present

## 2015-06-20 DIAGNOSIS — K760 Fatty (change of) liver, not elsewhere classified: Secondary | ICD-10-CM

## 2015-06-20 DIAGNOSIS — I10 Essential (primary) hypertension: Secondary | ICD-10-CM

## 2015-06-20 DIAGNOSIS — Z23 Encounter for immunization: Secondary | ICD-10-CM | POA: Diagnosis not present

## 2015-06-20 DIAGNOSIS — Z0000 Encounter for general adult medical examination without abnormal findings: Secondary | ICD-10-CM

## 2015-06-20 DIAGNOSIS — R945 Abnormal results of liver function studies: Secondary | ICD-10-CM

## 2015-06-20 NOTE — Patient Instructions (Signed)
Continue lifestyle intervention healthy eating and exercise . Try  HCS on rash    Wt Readings from Last 3 Encounters:  06/20/15 206 lb (93.441 kg)  11/27/14 218 lb (98.884 kg)  08/29/14 222 lb 11.2 oz (101.016 kg)  Check into shingles vaccine ( Zostavax) reimbursement or cost to you  and can return at any time if call ahead for injection.  lfts in 6 months.   No ov if doing well  ok.   Health Maintenance, Female Adopting a healthy lifestyle and getting preventive care can go a long way to promote health and wellness. Talk with your health care provider about what schedule of regular examinations is right for you. This is a good chance for you to check in with your provider about disease prevention and staying healthy. In between checkups, there are plenty of things you can do on your own. Experts have done a lot of research about which lifestyle changes and preventive measures are most likely to keep you healthy. Ask your health care provider for more information. WEIGHT AND DIET  Eat a healthy diet  Be sure to include plenty of vegetables, fruits, low-fat dairy products, and lean protein.  Do not eat a lot of foods high in solid fats, added sugars, or salt.  Get regular exercise. This is one of the most important things you can do for your health.  Most adults should exercise for at least 150 minutes each week. The exercise should increase your heart rate and make you sweat (moderate-intensity exercise).  Most adults should also do strengthening exercises at least twice a week. This is in addition to the moderate-intensity exercise.  Maintain a healthy weight  Body mass index (BMI) is a measurement that can be used to identify possible weight problems. It estimates body fat based on height and weight. Your health care provider can help determine your BMI and help you achieve or maintain a healthy weight.  For females 52 years of age and older:   A BMI below 18.5 is considered  underweight.  A BMI of 18.5 to 24.9 is normal.  A BMI of 25 to 29.9 is considered overweight.  A BMI of 30 and above is considered obese.  Watch levels of cholesterol and blood lipids  You should start having your blood tested for lipids and cholesterol at 62 years of age, then have this test every 5 years.  You may need to have your cholesterol levels checked more often if:  Your lipid or cholesterol levels are high.  You are older than 62 years of age.  You are at high risk for heart disease.  CANCER SCREENING   Lung Cancer  Lung cancer screening is recommended for adults 63-59 years old who are at high risk for lung cancer because of a history of smoking.  A yearly low-dose CT scan of the lungs is recommended for people who:  Currently smoke.  Have quit within the past 15 years.  Have at least a 30-pack-year history of smoking. A pack year is smoking an average of one pack of cigarettes a day for 1 year.  Yearly screening should continue until it has been 15 years since you quit.  Yearly screening should stop if you develop a health problem that would prevent you from having lung cancer treatment.  Breast Cancer  Practice breast self-awareness. This means understanding how your breasts normally appear and feel.  It also means doing regular breast self-exams. Let your health care provider know  about any changes, no matter how small.  If you are in your 20s or 30s, you should have a clinical breast exam (CBE) by a health care provider every 1-3 years as part of a regular health exam.  If you are 51 or older, have a CBE every year. Also consider having a breast X-ray (mammogram) every year.  If you have a family history of breast cancer, talk to your health care provider about genetic screening.  If you are at high risk for breast cancer, talk to your health care provider about having an MRI and a mammogram every year.  Breast cancer gene (BRCA) assessment is  recommended for women who have family members with BRCA-related cancers. BRCA-related cancers include:  Breast.  Ovarian.  Tubal.  Peritoneal cancers.  Results of the assessment will determine the need for genetic counseling and BRCA1 and BRCA2 testing. Cervical Cancer Your health care provider may recommend that you be screened regularly for cancer of the pelvic organs (ovaries, uterus, and vagina). This screening involves a pelvic examination, including checking for microscopic changes to the surface of your cervix (Pap test). You may be encouraged to have this screening done every 3 years, beginning at age 63.  For women ages 72-65, health care providers may recommend pelvic exams and Pap testing every 3 years, or they may recommend the Pap and pelvic exam, combined with testing for human papilloma virus (HPV), every 5 years. Some types of HPV increase your risk of cervical cancer. Testing for HPV may also be done on women of any age with unclear Pap test results.  Other health care providers may not recommend any screening for nonpregnant women who are considered low risk for pelvic cancer and who do not have symptoms. Ask your health care provider if a screening pelvic exam is right for you.  If you have had past treatment for cervical cancer or a condition that could lead to cancer, you need Pap tests and screening for cancer for at least 20 years after your treatment. If Pap tests have been discontinued, your risk factors (such as having a new sexual partner) need to be reassessed to determine if screening should resume. Some women have medical problems that increase the chance of getting cervical cancer. In these cases, your health care provider may recommend more frequent screening and Pap tests. Colorectal Cancer  This type of cancer can be detected and often prevented.  Routine colorectal cancer screening usually begins at 62 years of age and continues through 62 years of  age.  Your health care provider may recommend screening at an earlier age if you have risk factors for colon cancer.  Your health care provider may also recommend using home test kits to check for hidden blood in the stool.  A small camera at the end of a tube can be used to examine your colon directly (sigmoidoscopy or colonoscopy). This is done to check for the earliest forms of colorectal cancer.  Routine screening usually begins at age 28.  Direct examination of the colon should be repeated every 5-10 years through 62 years of age. However, you may need to be screened more often if early forms of precancerous polyps or small growths are found. Skin Cancer  Check your skin from head to toe regularly.  Tell your health care provider about any new moles or changes in moles, especially if there is a change in a mole's shape or color.  Also tell your health care provider if you  have a mole that is larger than the size of a pencil eraser.  Always use sunscreen. Apply sunscreen liberally and repeatedly throughout the day.  Protect yourself by wearing long sleeves, pants, a wide-brimmed hat, and sunglasses whenever you are outside. HEART DISEASE, DIABETES, AND HIGH BLOOD PRESSURE   High blood pressure causes heart disease and increases the risk of stroke. High blood pressure is more likely to develop in:  People who have blood pressure in the high end of the normal range (130-139/85-89 mm Hg).  People who are overweight or obese.  People who are African American.  If you are 12-24 years of age, have your blood pressure checked every 3-5 years. If you are 20 years of age or older, have your blood pressure checked every year. You should have your blood pressure measured twice--once when you are at a hospital or clinic, and once when you are not at a hospital or clinic. Record the average of the two measurements. To check your blood pressure when you are not at a hospital or clinic, you can  use:  An automated blood pressure machine at a pharmacy.  A home blood pressure monitor.  If you are between 70 years and 66 years old, ask your health care provider if you should take aspirin to prevent strokes.  Have regular diabetes screenings. This involves taking a blood sample to check your fasting blood sugar level.  If you are at a normal weight and have a low risk for diabetes, have this test once every three years after 62 years of age.  If you are overweight and have a high risk for diabetes, consider being tested at a younger age or more often. PREVENTING INFECTION  Hepatitis B  If you have a higher risk for hepatitis B, you should be screened for this virus. You are considered at high risk for hepatitis B if:  You were born in a country where hepatitis B is common. Ask your health care provider which countries are considered high risk.  Your parents were born in a high-risk country, and you have not been immunized against hepatitis B (hepatitis B vaccine).  You have HIV or AIDS.  You use needles to inject street drugs.  You live with someone who has hepatitis B.  You have had sex with someone who has hepatitis B.  You get hemodialysis treatment.  You take certain medicines for conditions, including cancer, organ transplantation, and autoimmune conditions. Hepatitis C  Blood testing is recommended for:  Everyone born from [birth year] through 1967.  Anyone with known risk factors for hepatitis C. Sexually transmitted infections (STIs)  You should be screened for sexually transmitted infections (STIs) including gonorrhea and chlamydia if:  You are sexually active and are younger than 62 years of age.  You are older than 62 years of age and your health care provider tells you that you are at risk for this type of infection.  Your sexual activity has changed since you were last screened and you are at an increased risk for chlamydia or gonorrhea. Ask your health care  provider if you are at risk.  If you do not have HIV, but are at risk, it may be recommended that you take a prescription medicine daily to prevent HIV infection. This is called pre-exposure prophylaxis (PrEP). You are considered at risk if:  You are sexually active and do not regularly use condoms or know the HIV status of your partner(s).  You take drugs by injection.  You are sexually active with a partner who has HIV. Talk with your health care provider about whether you are at high risk of being infected with HIV. If you choose to begin PrEP, you should first be tested for HIV. You should then be tested every 3 months for as long as you are taking PrEP.  PREGNANCY   If you are premenopausal and you may become pregnant, ask your health care provider about preconception counseling.  If you may become pregnant, take 400 to 800 micrograms (mcg) of folic acid every day.  If you want to prevent pregnancy, talk to your health care provider about birth control (contraception). OSTEOPOROSIS AND MENOPAUSE   Osteoporosis is a disease in which the bones lose minerals and strength with aging. This can result in serious bone fractures. Your risk for osteoporosis can be identified using a bone density scan.  If you are 67 years of age or older, or if you are at risk for osteoporosis and fractures, ask your health care provider if you should be screened.  Ask your health care provider whether you should take a calcium or vitamin D supplement to lower your risk for osteoporosis.  Menopause may have certain physical symptoms and risks.  Hormone replacement therapy may reduce some of these symptoms and risks. Talk to your health care provider about whether hormone replacement therapy is right for you.  HOME CARE INSTRUCTIONS   Schedule regular health, dental, and eye exams.  Stay current with your immunizations.   Do not use any tobacco products including cigarettes, chewing tobacco, or  electronic cigarettes.  If you are pregnant, do not drink alcohol.  If you are breastfeeding, limit how much and how often you drink alcohol.  Limit alcohol intake to no more than 1 drink per day for nonpregnant women. One drink equals 12 ounces of beer, 5 ounces of wine, or 1 ounces of hard liquor.  Do not use street drugs.  Do not share needles.  Ask your health care provider for help if you need support or information about quitting drugs.  Tell your health care provider if you often feel depressed.  Tell your health care provider if you have ever been abused or do not feel safe at home.   This information is not intended to replace advice given to you by your health care provider. Make sure you discuss any questions you have with your health care provider.   Document Released: 11/11/2010 Document Revised: 05/19/2014 Document Reviewed: 03/30/2013 Elsevier Interactive Patient Education [employer].

## 2015-06-20 NOTE — Assessment & Plan Note (Signed)
lfts normal today   Plan lab check in 6 months

## 2015-06-20 NOTE — Progress Notes (Signed)
Chief Complaint  Patient presents with  . Annual Exam    HPI: Patient  [name]  60 y.o. comes in today for Preventive Health Care visit  And Chronic disease management Ht doing ok Has lost weight from lsi Saw dr Fuller Plan about lfts  Said prob fatty liver  Working hard on this  paleao diet  Neck and ha still problematic   Health Maintenance  Topic Date Due  . HIV Screening  01/17/1969  . MAMMOGRAM  01/18/2004  . ZOSTAVAX  01/17/2014  . INFLUENZA VACCINE  12/11/2014  . PAP SMEAR  12/12/2016  . COLONOSCOPY  12/13/2016  . TETANUS/TDAP  03/28/2024  . Hepatitis C Screening  Completed   Health Maintenance Review LIFESTYLE:  Exercise:  s Tobacco/ETS:n Alcohol: per day  Sugar beverages:n Sleep:y Drug use: no  ROS:  GEN/ HEENT: No fever, significant weight changes sweats new headhacehs still battling from injury headaches vision problems hearing changes, CV/ PULM; No chest pain shortness of breath cough, syncope,edema  change in exercise tolerance. GI /GU: No adominal pain, vomiting, change in bowel habits. No blood in the stool. No significant GU symptoms. SKIN/HEME: ,no acute skin rashes suspicious lesions or bleeding. No lymphadenopathy, nodules, masses.  NEURO/ PSYCH:  No neurologic signs such as weakness numbness. No depression anxiety. IMM/ Allergy: No unusual infections.  Allergy .   REST of 12 system review negative except as per HPI   Past Medical History  Diagnosis Date  . Depression   . Hypothyroidism   . Headache(784.0)     hx of migraines when younger  . Fibromyalgia   . Hypertension   . Heart palpitations     hx with normal holter event monitoring  . Hx: UTI (urinary tract infection)   . Skin cancer     basal, squamous cell  . Atrial septal aneurysm / if pfo  echo 6 13  10/23/2011  . Fatty liver     "pre fatty liver"  . Pneumonia 1972    hx of  . GERD (gastroesophageal reflux disease)     hx of, none in a long time  . 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 Anal fissure   . Polyp of colon   . Obesity     Past Surgical History  Procedure Laterality Date  . Tonsillectomy    . Ectopic pregnancy surgery  1983  . Knee arthroscopy      both in past  . Tonsillectomy  1971  . Skin cancer excision Bilateral     arm, legs, and chest  . Dilation and curettage of uterus  1978  . Shoulder open rotator cuff repair Left 07/07/2013    Procedure: LEFT SHOULDER MINI OPEN SUBACROMIAL DECOMPRESSION ROTATOR CUFF REPAIR AND POSSIBLE PATCH GRAFT ;  Surgeon: Johnn Hai, MD;  Location: [facility];  Service: Orthopedics;  Laterality: Left;  with interscaline block    Family History  Problem Relation Age of Onset  . Hypertension Mother     low borderline  . Hypertension Father   . Liver disease Father     amyloid deceased  . Juvenile Diabetes Daughter   . ADD / ADHD Child   . Hyperlipidemia      Maternal grandmother  . Osteoporosis Mother   . Melanoma Sister   . Colon cancer Neg Hx   . Stomach cancer Neg Hx     Social History   Social History  . Marital Status: Married    Spouse Name: N/A  .  Number of Children: 6  . Years of Education: N/A   Occupational History  . TEACHER [employer]    Social History Main Topics  . Smoking status: Never Smoker   . Smokeless tobacco: Never Used  . Alcohol Use: Yes     Comment: rare  . Drug Use: No  . Sexual Activity: Not Asked   Other Topics Concern  . 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 15 left shoulder surgery    Married   Special educ   HH of 4   3 outside dogs, 8 goats, 40 chickens and quail. [place]   Job stresses   Ex husband passes away                Outpatient Prescriptions Prior to Visit  Medication Sig Dispense Refill  . Ascorbic Acid (VITAMIN C) 1000 MG tablet Take 1,000 mg by mouth daily.    . Cholecalciferol (VITAMIN D3) 10000 UNITS capsule Take 10,000 Units by mouth daily.    Marland Kitchen  DHEA 10 MG TABS Take by mouth.    . Iodine, Kelp, TABS Take 1 tablet by mouth daily.    Javier Docker Oil 1000 MG CAPS Take 1 capsule by mouth 2 (two) times daily.    . Magnesium 250 MG TABS Take 2 tablets by mouth daily.    . Multiple Vitamin (MULTIVITAMIN WITH MINERALS) TABS tablet Take 1 tablet by mouth daily.    Marland Kitchen NATURE-THROID 16.25 MG TABS     . NATURE-THROID 97.5 MG TABS     . NON FORMULARY Similace for dietary health once with each meal    . OVER THE COUNTER MEDICATION Take 2 tablets by mouth 2 (two) times daily. Hepatrope 2 for liver support    . potassium gluconate 595 MG TABS tablet Take 595 mg by mouth daily.    . Progesterone Micronized (PROGESTERONE PO) Take 150 mg by mouth daily.     Marland Kitchen Ubiquinol 100 MG CAPS Take 1 capsule by mouth daily.    . valsartan-hydrochlorothiazide (DIOVAN-HCT) 320-25 MG tablet TAKE 1 TABLET BY MOUTH DAILY. 90 tablet 0  . verapamil (CALAN-SR) 120 MG CR tablet Take 1 tablet (120 mg total) by mouth at bedtime. 90 tablet 3  . VITAMIN K PO Take 400 mcg by mouth daily.    Marland Kitchen acidophilus (RISAQUAD) CAPS capsule Take 2 capsules by mouth daily.    . fluticasone (FLONASE) 50 MCG/ACT nasal spray      No facility-administered medications prior to visit.     EXAM:  BP 136/76 mmHg  Temp(Src) 98.6 F (37 C) (Oral)  Ht $R'5\' 5"'ZC$  (1.651 m)  Wt 206 lb (93.441 kg)  BMI 34.28 kg/m2  Body mass index is 34.28 kg/(m^2).  Physical Exam: Vital signs reviewed TIR:WERX is a well-developed well-nourished alert cooperative    who appearsr stated age in no acute distress.  HEENT: normocephalic atraumatic , Eyes: PERRL EOM's full, conjunctiva clear, Nares: paten,t no deformity discharge or tenderness., Ears: no deformity EAC's clear TMs with normal landmarks. Mouth: clear OP, no lesions, edema.  Moist mucous membranes. Dentition in adequate repair. NECK: mild spasm  without masses, thyromegaly or bruits. CHEST/PULM:  Clear to auscultation and percussion breath sounds equal no  wheeze , rales or rhonchi. No chest wall deformities or tenderness. CV: PMI is nondisplaced, S1 S2 no gallops, murmurs, rubs. Peripheral pulses are full without delay.No JVD . Breast: normal  by inspection . No dimpling, discharge, masses, tenderness or discharge . ABDOMEN: Bowel sounds normal nontender  No guard or rebound, no hepato splenomegal no CVA tenderness.  No hernia. Extremtities:  No clubbing cyanosis or edema, no acute joint swelling or redness no focal atrophy NEURO:  Oriented x3, cranial nerves 3-12 appear to be intact, no obvious focal weakness,gait within normal limits no abnormal reflexes or asymmetrical SKIN: normal turgor, color, no bruising or petechiae. Rash along  Panty line right buttock s says itchy looks like CD  PSYCH: Oriented, good eye contact, no obvious depression anxiety, cognition and judgment appear normal. LN: no cervical axillary inguinal adenopathy  Lab Results  Component Value Date   WBC 8.9 06/13/2015   HGB 13.6 06/13/2015   HCT 41.7 06/13/2015   PLT 260.0 06/13/2015   GLUCOSE 92 06/13/2015   CHOL 243* 06/13/2015   TRIG 59.0 06/13/2015   HDL 59.40 06/13/2015   LDLDIRECT 165.2 12/27/2012   LDLCALC 172* 06/13/2015   ALT 28 06/13/2015   AST 20 06/13/2015   NA 141 06/13/2015   K 4.7 06/13/2015   CL 104 06/13/2015   CREATININE 0.77 06/13/2015   BUN 17 06/13/2015   CO2 29 06/13/2015   TSH 1.59 06/13/2015   HGBA1C 5.2 06/01/2013    ASSESSMENT AND PLAN:  Discussed the following assessment and plan:  Visit for preventive health examination  Essential hypertension  Abnormal LFTs  Need for hepatitis A and B vaccination - Plan: Hepatitis A hepatitis B combined vaccine IM  Fatty liver Much improved LFTs so far I believe lifestyle intervention is helpful. Discussed recommendations repeat LFTs in 6 months if okay then checkup in a year with full set of labs. Her cholesterol still quite elevated although not worsened part may be genetic. Follow.  Blood pressure controlled. Blood sugar is much better. Patient Care Team: Burnis Medin, MD as PCP - General Kathee Delton, MD (Pulmonary Disease) Arvella Nigh, MD as Consulting Physician (Obstetrics and Gynecology) Dr Peggye Form, MD as Consulting Physician (Orthopedic Surgery) Ladene Artist, MD as Consulting Physician (Gastroenterology) Patient Instructions   Continue lifestyle intervention healthy eating and exercise . Try  HCS on rash    Wt Readings from Last 3 Encounters:  06/20/15 206 lb (93.441 kg)  11/27/14 218 lb (98.884 kg)  08/29/14 222 lb 11.2 oz (101.016 kg)  Check into shingles vaccine ( Zostavax) reimbursement or cost to you  and can return at any time if call ahead for injection.  lfts in 6 months.   No ov if doing well  ok.   Health Maintenance, Female Adopting a healthy lifestyle and getting preventive care can go a long way to promote health and wellness. Talk with your health care provider about what schedule of regular examinations is right for you. This is a good chance for you to check in with your provider about disease prevention and staying healthy. In between checkups, there are plenty of things you can do on your own. Experts have done a lot of research about which lifestyle changes and preventive measures are most likely to keep you healthy. Ask your health care provider for more information. WEIGHT AND DIET  Eat a healthy diet  Be sure to include plenty of vegetables, fruits, low-fat dairy products, and lean protein.  Do not eat a lot of foods high in solid fats, added sugars, or salt.  Get regular exercise. This is one of the most important things you can do for  your health.  Most adults should exercise for at least 150 minutes each week. The exercise should increase your heart rate and make you sweat (moderate-intensity exercise).  Most adults should also do strengthening exercises at least twice a week. This is in addition to the  moderate-intensity exercise.  Maintain a healthy weight  Body mass index (BMI) is a measurement that can be used to identify possible weight problems. It estimates body fat based on height and weight. Your health care provider can help determine your BMI and help you achieve or maintain a healthy weight.  For females 3 years of age and older:   A BMI below 18.5 is considered underweight.  A BMI of 18.5 to 24.9 is normal.  A BMI of 25 to 29.9 is considered overweight.  A BMI of 30 and above is considered obese.  Watch levels of cholesterol and blood lipids  You should start having your blood tested for lipids and cholesterol at 62 years of age, then have this test every 5 years.  You may need to have your cholesterol levels checked more often if:  Your lipid or cholesterol levels are high.  You are older than 62 years of age.  You are at high risk for heart disease.  CANCER SCREENING   Lung Cancer  Lung cancer screening is recommended for adults 95-34 years old who are at high risk for lung cancer because of a history of smoking.  A yearly low-dose CT scan of the lungs is recommended for people who:  Currently smoke.  Have quit within the past 15 years.  Have at least a 30-pack-year history of smoking. A pack year is smoking an average of one pack of cigarettes a day for 1 year.  Yearly screening should continue until it has been 15 years since you quit.  Yearly screening should stop if you develop a health problem that would prevent you from having lung cancer treatment.  Breast Cancer  Practice breast self-awareness. This means understanding how your breasts normally appear and feel.  It also means doing regular breast self-exams. Let your health care provider know about any changes, no matter how small.  If you are in your 20s or 30s, you should have a clinical breast exam (CBE) by a health care provider every 1-3 years as part of a regular health exam.  If  you are 25 or older, have a CBE every year. Also consider having a breast X-ray (mammogram) every year.  If you have a family history of breast cancer, talk to your health care provider about genetic screening.  If you are at high risk for breast cancer, talk to your health care provider about having an MRI and a mammogram every year.  Breast cancer gene (BRCA) assessment is recommended for women who have family members with BRCA-related cancers. BRCA-related cancers include:  Breast.  Ovarian.  Tubal.  Peritoneal cancers.  Results of the assessment will determine the need for genetic counseling and BRCA1 and BRCA2 testing. Cervical Cancer Your health care provider may recommend that you be screened regularly for cancer of the pelvic organs (ovaries, uterus, and vagina). This screening involves a pelvic examination, including checking for microscopic changes to the surface of your cervix (Pap test). You may be encouraged to have this screening done every 3 years, beginning at age 42.  For women ages 62-65, health care providers may recommend pelvic exams and Pap testing every 3 years, or they may recommend the Pap and pelvic exam,  combined with testing for human papilloma virus (HPV), every 5 years. Some types of HPV increase your risk of cervical cancer. Testing for HPV may also be done on women of any age with unclear Pap test results.  Other health care providers may not recommend any screening for nonpregnant women who are considered low risk for pelvic cancer and who do not have symptoms. Ask your health care provider if a screening pelvic exam is right for you.  If you have had past treatment for cervical cancer or a condition that could lead to cancer, you need Pap tests and screening for cancer for at least 20 years after your treatment. If Pap tests have been discontinued, your risk factors (such as having a new sexual partner) need to be reassessed to determine if screening should  resume. Some women have medical problems that increase the chance of getting cervical cancer. In these cases, your health care provider may recommend more frequent screening and Pap tests. Colorectal Cancer  This type of cancer can be detected and often prevented.  Routine colorectal cancer screening usually begins at 62 years of age and continues through 62 years of age.  Your health care provider may recommend screening at an earlier age if you have risk factors for colon cancer.  Your health care provider may also recommend using home test kits to check for hidden blood in the stool.  A small camera at the end of a tube can be used to examine your colon directly (sigmoidoscopy or colonoscopy). This is done to check for the earliest forms of colorectal cancer.  Routine screening usually begins at age 70.  Direct examination of the colon should be repeated every 5-10 years through 62 years of age. However, you may need to be screened more often if early forms of precancerous polyps or small growths are found. Skin Cancer  Check your skin from head to toe regularly.  Tell your health care provider about any new moles or changes in moles, especially if there is a change in a mole's shape or color.  Also tell your health care provider if you have a mole that is larger than the size of a pencil eraser.  Always use sunscreen. Apply sunscreen liberally and repeatedly throughout the day.  Protect yourself by wearing long sleeves, pants, a wide-brimmed hat, and sunglasses whenever you are outside. HEART DISEASE, DIABETES, AND HIGH BLOOD PRESSURE   High blood pressure causes heart disease and increases the risk of stroke. High blood pressure is more likely to develop in:  People who have blood pressure in the high end of the normal range (130-139/85-89 mm Hg).  People who are overweight or obese.  People who are African American.  If you are 42-77 years of age, have your blood pressure  checked every 3-5 years. If you are 57 years of age or older, have your blood pressure checked every year. You should have your blood pressure measured twice--once when you are at a hospital or clinic, and once when you are not at a hospital or clinic. Record the average of the two measurements. To check your blood pressure when you are not at a hospital or clinic, you can use:  An automated blood pressure machine at a pharmacy.  A home blood pressure monitor.  If you are between 67 years and 46 years old, ask your health care provider if you should take aspirin to prevent strokes.  Have regular diabetes screenings. This involves taking a blood sample  to check your fasting blood sugar level.  If you are at a normal weight and have a low risk for diabetes, have this test once every three years after 62 years of age.  If you are overweight and have a high risk for diabetes, consider being tested at a younger age or more often. PREVENTING INFECTION  Hepatitis B  If you have a higher risk for hepatitis B, you should be screened for this virus. You are considered at high risk for hepatitis B if:  You were born in a country where hepatitis B is common. Ask your health care provider which countries are considered high risk.  Your parents were born in a high-risk country, and you have not been immunized against hepatitis B (hepatitis B vaccine).  You have HIV or AIDS.  You use needles to inject street drugs.  You live with someone who has hepatitis B.  You have had sex with someone who has hepatitis B.  You get hemodialysis treatment.  You take certain medicines for conditions, including cancer, organ transplantation, and autoimmune conditions. Hepatitis C  Blood testing is recommended for:  Everyone born from [birth year] through 1967.  Anyone with known risk factors for hepatitis C. Sexually transmitted infections (STIs)  You should be screened for sexually transmitted infections (STIs)  including gonorrhea and chlamydia if:  You are sexually active and are younger than 62 years of age.  You are older than 62 years of age and your health care provider tells you that you are at risk for this type of infection.  Your sexual activity has changed since you were last screened and you are at an increased risk for chlamydia or gonorrhea. Ask your health care provider if you are at risk.  If you do not have HIV, but are at risk, it may be recommended that you take a prescription medicine daily to prevent HIV infection. This is called pre-exposure prophylaxis (PrEP). You are considered at risk if:  You are sexually active and do not regularly use condoms or know the HIV status of your partner(s).  You take drugs by injection.  You are sexually active with a partner who has HIV. Talk with your health care provider about whether you are at high risk of being infected with HIV. If you choose to begin PrEP, you should first be tested for HIV. You should then be tested every 3 months for as long as you are taking PrEP.  PREGNANCY   If you are premenopausal and you may become pregnant, ask your health care provider about preconception counseling.  If you may become pregnant, take 400 to 800 micrograms (mcg) of folic acid every day.  If you want to prevent pregnancy, talk to your health care provider about birth control (contraception). OSTEOPOROSIS AND MENOPAUSE   Osteoporosis is a disease in which the bones lose minerals and strength with aging. This can result in serious bone fractures. Your risk for osteoporosis can be identified using a bone density scan.  If you are 63 years of age or older, or if you are at risk for osteoporosis and fractures, ask your health care provider if you should be screened.  Ask your health care provider whether you should take a calcium or vitamin D supplement to lower your risk for osteoporosis.  Menopause may have certain physical symptoms and  risks.  Hormone replacement therapy may reduce some of these symptoms and risks. Talk to your health care provider about whether hormone replacement therapy is  right for you.  HOME CARE INSTRUCTIONS   Schedule regular health, dental, and eye exams.  Stay current with your immunizations.   Do not use any tobacco products including cigarettes, chewing tobacco, or electronic cigarettes.  If you are pregnant, do not drink alcohol.  If you are breastfeeding, limit how much and how often you drink alcohol.  Limit alcohol intake to no more than 1 drink per day for nonpregnant women. One drink equals 12 ounces of beer, 5 ounces of wine, or 1 ounces of hard liquor.  Do not use street drugs.  Do not share needles.  Ask your health care provider for help if you need support or information about quitting drugs.  Tell your health care provider if you often feel depressed.  Tell your health care provider if you have ever been abused or do not feel safe at home.   This information is not intended to replace advice given to you by your health care provider. Make sure you discuss any questions you have with your health care provider.   Document Released: 11/11/2010 Document Revised: 05/19/2014 Document Reviewed: 03/30/2013 Elsevier Interactive Patient Education 2016 [place]. Margaret Cockerill M.D.

## 2015-08-19 ENCOUNTER — Ambulatory Visit (HOSPITAL_COMMUNITY)
Admission: EM | Admit: 2015-08-19 | Discharge: 2015-08-19 | Disposition: A | Discharge status: Home / Self Care | Payer: BC Managed Care – PPO

## 2015-08-20 ENCOUNTER — Other Ambulatory Visit: Payer: Self-pay | Admitting: Internal Medicine

## 2015-08-20 ENCOUNTER — Ambulatory Visit (INDEPENDENT_AMBULATORY_CARE_PROVIDER_SITE_OTHER): Payer: BC Managed Care – PPO | Admitting: Family Medicine

## 2015-08-20 ENCOUNTER — Telehealth: Payer: Self-pay | Admitting: Internal Medicine

## 2015-08-20 VITALS — BP 120/82 | HR 108 | Temp 98.4°F | Resp 16 | Ht 65.0 in | Wt 204.0 lb

## 2015-08-20 DIAGNOSIS — R509 Fever, unspecified: Secondary | ICD-10-CM | POA: Diagnosis not present

## 2015-08-20 DIAGNOSIS — J069 Acute upper respiratory infection, unspecified: Secondary | ICD-10-CM

## 2015-08-20 DIAGNOSIS — R059 Cough, unspecified: Secondary | ICD-10-CM

## 2015-08-20 DIAGNOSIS — R05 Cough: Secondary | ICD-10-CM | POA: Diagnosis not present

## 2015-08-20 DIAGNOSIS — B9789 Other viral agents as the cause of diseases classified elsewhere: Secondary | ICD-10-CM

## 2015-08-20 LAB — POCT INFLUENZA A/B
INFLUENZA B, POC: NEGATIVE
Influenza A, POC: NEGATIVE

## 2015-08-20 MED ORDER — HYDROCODONE-HOMATROPINE 5-1.5 MG/5ML PO SYRP: 5.0000 mL | ORAL_SOLUTION | Freq: Four times a day (QID) | ORAL | Status: DC | PRN

## 2015-08-20 NOTE — Telephone Encounter (Signed)
Called and spoke to the pt.  She is currently at [facility].  Daughter was dx with the flu (last week) and now has PNA.  Husband was dx with the flu on 08-19-15.  Today pt has cough and body temperature up to 99.  Pt states she usually runs 97.  Concerned she is getting the flu or PNA.

## 2015-08-20 NOTE — Patient Instructions (Addendum)
Your influenza test is negative. You likely have a viral illness.  For muscle aches, headache, sore throat you can take over the counter acetaminophen or ibuprofen as directed on the package For nasal congestion you can use Afrin nasal spray twice a day for up to 3 days, and /or sudafed, and/or saline nasal spray For cough you can use Delsym cough syrup Please come back to see [place] or go to the emergency department if you are not better in 5 to 7 days or if you develop fever over 101 for more than 48 hours or if you develop wheezing or shortness of breath.      IF you received an x-ray today, you will receive an invoice from [facility]. Please contact [facility] at [phone] with questions or concerns regarding your invoice.   IF you received labwork today, you will receive an invoice from [employer]. Please contact [facility] at [phone] with questions or concerns regarding your invoice.   Our billing staff will not be able to assist you with questions regarding bills from these companies.  You will be contacted with the lab results as soon as they are available. The fastest way to get your results is to activate your My Chart account. Instructions are located on the last page of this paperwork. If you have not heard from [place] regarding the results in 2 weeks, please contact this office.

## 2015-08-20 NOTE — Telephone Encounter (Signed)
Pt would like to know if we have the Flu test in.  If so please give her a call.

## 2015-08-20 NOTE — Progress Notes (Signed)
Subjective:    Patient ID: [name], female    DOB: Feb 03, 1956, 60 y.o.   MRN: [id number]  HPI This is a pleasant 60 yo female who presents today with cough that started this morning. Cough non productive. Some burning in chest with cough. Some nasal drainage and congestion. Her daughter has double pneumonia (flu negative x 2) and her husband has the flu and she is concerned that she may have the flu. Has felt warm. No muscle aches. No SOB.   Her daughter has been in the hospital for the last 5 days. The patient has been staying nights with her daughter. Has had little sleep and has not been drinking many fluids.   Past Medical History  Diagnosis Date  . Depression   . Hypothyroidism   . Headache(784.0)     hx of migraines when younger  . Fibromyalgia   . Hypertension   . Heart palpitations     hx with normal holter event monitoring  . Hx: UTI (urinary tract infection)   . Skin cancer     basal, squamous cell  . Atrial septal aneurysm / if pfo  echo 6 13  10/23/2011  . Fatty liver     "pre fatty liver"  . Pneumonia 1972    hx of  . GERD (gastroesophageal reflux disease)     hx of, none in a long time  . 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 Anal fissure   . Polyp of colon   . Obesity    Past Surgical History  Procedure Laterality Date  . Tonsillectomy    . Ectopic pregnancy surgery  1983  . Knee arthroscopy      both in past  . Tonsillectomy  1971  . Skin cancer excision Bilateral     arm, legs, and chest  . Dilation and curettage of uterus  1978  . Shoulder open rotator cuff repair Left 07/07/2013    Procedure: LEFT SHOULDER MINI OPEN SUBACROMIAL DECOMPRESSION ROTATOR CUFF REPAIR AND POSSIBLE PATCH GRAFT ;  Surgeon: Johnn Hai, MD;  Location: [facility];  Service: Orthopedics;  Laterality: Left;  with interscaline block   Family History  Problem Relation Age of Onset  . Hypertension Mother     low borderline  . Osteoporosis Mother   . Hypertension Father     . Liver disease Father     amyloid deceased  . Hyperlipidemia Father   . Juvenile Diabetes Daughter   . ADD / ADHD Child   . Hyperlipidemia      Maternal grandmother  . Melanoma Sister   . Cancer Sister   . Colon cancer Neg Hx   . Stomach cancer Neg Hx    Social History  Substance Use Topics  . Smoking status: Never Smoker   . Smokeless tobacco: Never Used  . Alcohol Use: Yes     Comment: rare      Review of Systems Per HPI     Objective:   Physical Exam  Constitutional: She is oriented to person, place, and time. She appears well-developed and well-nourished. No distress.  Occasional dry cough   HENT:  Head: Normocephalic and atraumatic.  Right Ear: Tympanic membrane, external ear and ear canal normal.  Left Ear: Tympanic membrane, external ear and ear canal normal.  Nose: Mucosal edema and rhinorrhea present.  Mouth/Throat: Uvula is midline and oropharynx is clear and moist.  Eyes: Conjunctivae are normal.  Neck: Normal range of motion. Neck supple.  Cardiovascular:  Normal rate, regular rhythm and normal heart sounds.   HR 90 on auscultation.   Pulmonary/Chest: Effort normal and breath sounds normal.  Lymphadenopathy:    She has no cervical adenopathy.  Neurological: She is alert and oriented to person, place, and time.  Skin: Skin is warm and dry. She is not diaphoretic.  Psychiatric: She has a normal mood and affect. Her behavior is normal. Judgment and thought content normal.  Vitals reviewed.     BP 120/82 mmHg  Pulse 108  Temp(Src) 98.4 F (36.9 C) (Oral)  Resp 16  Ht 5\' 5"  (1.651 m)  Wt 204 lb (92.534 kg)  BMI 33.95 kg/m2  SpO2 97% Wt Readings from Last 3 Encounters:  08/20/15 204 lb (92.534 kg)  06/20/15 206 lb (93.441 kg)  11/27/14 218 lb (98.884 kg)   Results for orders placed or performed in visit on 08/20/15  POCT Influenza A/B  Result Value Ref Range   Influenza A, POC Negative Negative   Influenza B, POC Negative Negative       Assessment & Plan:  1. Cough - POCT Influenza A/B - HYDROcodone-homatropine (HYCODAN) 5-1.5 MG/5ML syrup; Take 5 mLs by mouth every 6 (six) hours as needed for cough.  Dispense: 120 mL; Refill: 0  2. Fever, unspecified - POCT Influenza A/B  3. Viral URI with cough - negative influenza - discussed symptomatic treatment, RTC precautions   Clarene Reamer, FNP-BC  [facility]  08/20/2015 11:23 AM

## 2015-08-21 NOTE — Telephone Encounter (Signed)
Sent to the pharmacy by e-scribe. 

## 2015-08-23 ENCOUNTER — Ambulatory Visit (INDEPENDENT_AMBULATORY_CARE_PROVIDER_SITE_OTHER): Payer: BC Managed Care – PPO | Admitting: Internal Medicine

## 2015-08-23 ENCOUNTER — Encounter: Payer: Self-pay | Admitting: Internal Medicine

## 2015-08-23 VITALS — BP 120/70 | HR 98 | Temp 98.9°F | Wt 204.0 lb

## 2015-08-23 DIAGNOSIS — J988 Other specified respiratory disorders: Secondary | ICD-10-CM | POA: Diagnosis not present

## 2015-08-23 DIAGNOSIS — Z20828 Contact with and (suspected) exposure to other viral communicable diseases: Secondary | ICD-10-CM

## 2015-08-23 DIAGNOSIS — R21 Rash and other nonspecific skin eruption: Secondary | ICD-10-CM

## 2015-08-23 DIAGNOSIS — J22 Unspecified acute lower respiratory infection: Secondary | ICD-10-CM

## 2015-08-23 NOTE — Patient Instructions (Signed)
Lungs are clear today and pulse ox is good.  Rest fluids  . Try miconazole or  Clotrimazole  etc to rash and  1-2 % HCS .   This acts  Like a viral resp infection  . That should  Resolve on its own  If not check with [place] .

## 2015-08-23 NOTE — Progress Notes (Signed)
Pre visit review using our clinic review tool, if applicable. No additional management support is needed unless otherwise documented below in the visit note.  Chief Complaint  Patient presents with  . Cough    Started on Sunday  . Nasal Congestion  . Fever  . Fatigue    HPI: [name] 60 y.o.  comesin for sda  [facility]en [facility]  Days ago for flu like illness   poct neg flu  Says temp  in 99 range  Daughter  Recently hosp  With pna .    Husband had flu and bronchitis  And then was in hospital  And low grade  Fever.  Clear  phelgm  .  Ur congestgoin no headache . Doesn't feel like when had pna in past  jsut get checked  Tired . Onset  About sun day night .   Check rash using ketoconazole there for a month think getting better min ithcing at times  ROS: See pertinent positives and negatives per HPI.  Past Medical History  Diagnosis Date  . Depression   . Hypothyroidism   . Headache(784.0)     hx of migraines when younger  . Fibromyalgia   . Hypertension   . Heart palpitations     hx with normal holter event monitoring  . Hx: UTI (urinary tract infection)   . Skin cancer     basal, squamous cell  . Atrial septal aneurysm / if pfo  echo 6 13  10/23/2011  . Fatty liver     "pre fatty liver"  . Pneumonia 1972    hx of  . GERD (gastroesophageal reflux disease)     hx of, none in a long time  . 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 Anal fissure   . Polyp of colon   . Obesity     Family History  Problem Relation Age of Onset  . Hypertension Mother     low borderline  . Osteoporosis Mother   . Hypertension Father   . Liver disease Father     amyloid deceased  . Hyperlipidemia Father   . Juvenile Diabetes Daughter   . ADD / ADHD Child   . Hyperlipidemia      Maternal grandmother  . Melanoma Sister   . Cancer Sister   . Colon cancer Neg Hx   . Stomach cancer Neg Hx     Social History   Social History  . Marital Status: Married    Spouse Name: N/A  .  Number of Children: 6  . Years of Education: N/A   Occupational History  . TEACHER [employer]    Social History Main Topics  . Smoking status: Never Smoker   . Smokeless tobacco: Never Used  . Alcohol Use: Yes     Comment: rare  . Drug Use: No  . Sexual Activity: Not Asked   Other Topics Concern  . 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 15 left shoulder surgery    Married   Special educ   HH of 4   3 outside dogs, 8 goats, 40 chickens and quail. [place]   Job stresses   Ex husband passes away   Not working after injur at school  Shoulder neck                    Outpatient Prescriptions Prior to Visit  Medication  Sig Dispense Refill  . Ascorbic Acid (VITAMIN C) 1000 MG tablet Take 1,000 mg by mouth daily.    . Cholecalciferol (VITAMIN D3) 10000 UNITS capsule Take 10,000 Units by mouth daily.    Marland Kitchen DHEA 10 MG TABS Take by mouth.    Marland Kitchen HYDROcodone-homatropine (HYCODAN) 5-1.5 MG/5ML syrup Take 5 mLs by mouth every 6 (six) hours as needed for cough. 120 mL 0  . Iodine, Kelp, TABS Take 1 tablet by mouth daily.    Javier Docker Oil 1000 MG CAPS Take 1 capsule by mouth 2 (two) times daily.    . Magnesium 250 MG TABS Take 2 tablets by mouth daily.    . Multiple Vitamin (MULTIVITAMIN WITH MINERALS) TABS tablet Take 1 tablet by mouth daily.    Marland Kitchen NATURE-THROID 16.25 MG TABS     . NATURE-THROID 97.5 MG TABS     . NON FORMULARY Reported on 08/20/2015    . OVER THE COUNTER MEDICATION Take 2 tablets by mouth 2 (two) times daily. Reported on 08/20/2015    . potassium gluconate 595 MG TABS tablet Take 595 mg by mouth daily.    . Probiotic Product (PROBIOTIC PO) Take by mouth.    . Progesterone Micronized (PROGESTERONE PO) Take 150 mg by mouth daily.     Marland Kitchen Ubiquinol 100 MG CAPS Take 1 capsule by mouth daily.    . valsartan-hydrochlorothiazide (DIOVAN-HCT) 320-25 MG tablet TAKE 1 TABLET BY MOUTH DAILY. 90 tablet 2  .  verapamil (CALAN-SR) 120 MG CR tablet Take 1 tablet (120 mg total) by mouth at bedtime. 90 tablet 3  . VITAMIN K PO Take 400 mcg by mouth daily.     No facility-administered medications prior to visit.     EXAM:  BP 120/70 mmHg  Pulse 98  Temp(Src) 98.9 F (37.2 C) (Oral)  Wt 204 lb (92.534 kg)  SpO2 97%  Body mass index is 33.95 kg/(m^2).  GENERAL: vitals reviewed and listed above, alert, oriented, appears well hydrated and in no acute distress congested non toxic nl speech HEENT: atraumatic, conjunctiva  clear, no obvious abnormalities on inspection of external nose and ears tm nad nared con neg face pain  OP : no lesion edema or exudate  NECK: no obvious masses on inspection palpation  LUNGS: clear to auscultation bilaterally, no wheezes, rales or rhonchi, good air movement all lung fields  CV: HRRR, no clubbing cyanosis or  peripheral edema nl cap refill  MS: moves all extremities without noticeable focal  abnormalitys PSYCH: pleasant and cooperative, no obvious depression or anxiety Skin fading linear scaling  Indistinct border  Right upper thigh ASSESSMENT AND PLAN:  Discussed the following assessment and plan:  Acute respiratory infection - prob viral low grade  ? flu like  Exposure to the flu  Rash and nonspecific skin eruption Exam is reassuring but  If any fever  Then consider x ray  To check for   PNA  Pt will be here after weekend  With daughter fu visit  To let us know how doing  Multiple flu exposures    Rash prob  Contact vs eczema  ? Had been using ketoconazle but not typical of tinea  Can use otcs and hcs and follow -Patient advised to return or notify health care team  if symptoms worsen ,persist or new concerns arise.  Patient Instructions  Lungs are clear today and pulse ox is good.  Rest fluids  . Try miconazole or  Clotrimazole  etc to rash and  1-2 % HCS .   This acts  Like a viral resp infection  . That should  Resolve on its own  If not check with  [place] .         Standley Brooking. Wilton Thrall M.D.

## 2015-09-24 ENCOUNTER — Encounter (HOSPITAL_COMMUNITY): Payer: Self-pay | Admitting: Emergency Medicine

## 2015-09-24 ENCOUNTER — Telehealth: Payer: Self-pay | Admitting: Family Medicine

## 2015-09-24 ENCOUNTER — Ambulatory Visit (INDEPENDENT_AMBULATORY_CARE_PROVIDER_SITE_OTHER): Payer: BC Managed Care – PPO | Admitting: Internal Medicine

## 2015-09-24 ENCOUNTER — Emergency Department (HOSPITAL_COMMUNITY)
Admission: EM | Admit: 2015-09-24 | Discharge: 2015-09-24 | Disposition: A | Discharge status: Home / Self Care | Payer: BC Managed Care – PPO | Attending: Emergency Medicine | Admitting: Emergency Medicine

## 2015-09-24 ENCOUNTER — Encounter: Payer: Self-pay | Admitting: Internal Medicine

## 2015-09-24 VITALS — BP 122/82 | HR 79 | Temp 98.3°F | Ht 66.0 in | Wt 206.0 lb

## 2015-09-24 DIAGNOSIS — R030 Elevated blood-pressure reading, without diagnosis of hypertension: Secondary | ICD-10-CM

## 2015-09-24 DIAGNOSIS — E669 Obesity, unspecified: Secondary | ICD-10-CM | POA: Diagnosis not present

## 2015-09-24 DIAGNOSIS — E039 Hypothyroidism, unspecified: Secondary | ICD-10-CM | POA: Diagnosis not present

## 2015-09-24 DIAGNOSIS — R252 Cramp and spasm: Secondary | ICD-10-CM

## 2015-09-24 DIAGNOSIS — I10 Essential (primary) hypertension: Secondary | ICD-10-CM | POA: Diagnosis not present

## 2015-09-24 DIAGNOSIS — F329 Major depressive disorder, single episode, unspecified: Secondary | ICD-10-CM | POA: Insufficient documentation

## 2015-09-24 DIAGNOSIS — E871 Hypo-osmolality and hyponatremia: Secondary | ICD-10-CM | POA: Diagnosis not present

## 2015-09-24 DIAGNOSIS — Z79899 Other long term (current) drug therapy: Secondary | ICD-10-CM | POA: Insufficient documentation

## 2015-09-24 DIAGNOSIS — Z85828 Personal history of other malignant neoplasm of skin: Secondary | ICD-10-CM | POA: Insufficient documentation

## 2015-09-24 DIAGNOSIS — Z96653 Presence of artificial knee joint, bilateral: Secondary | ICD-10-CM | POA: Diagnosis not present

## 2015-09-24 DIAGNOSIS — R002 Palpitations: Secondary | ICD-10-CM

## 2015-09-24 DIAGNOSIS — R0789 Other chest pain: Secondary | ICD-10-CM

## 2015-09-24 DIAGNOSIS — Z6833 Body mass index (BMI) 33.0-33.9, adult: Secondary | ICD-10-CM | POA: Insufficient documentation

## 2015-09-24 DIAGNOSIS — R682 Dry mouth, unspecified: Secondary | ICD-10-CM | POA: Diagnosis not present

## 2015-09-24 DIAGNOSIS — E876 Hypokalemia: Secondary | ICD-10-CM

## 2015-09-24 DIAGNOSIS — R110 Nausea: Secondary | ICD-10-CM | POA: Diagnosis not present

## 2015-09-24 DIAGNOSIS — R0989 Other specified symptoms and signs involving the circulatory and respiratory systems: Secondary | ICD-10-CM

## 2015-09-24 DIAGNOSIS — K219 Gastro-esophageal reflux disease without esophagitis: Secondary | ICD-10-CM | POA: Insufficient documentation

## 2015-09-24 LAB — URINALYSIS, ROUTINE W REFLEX MICROSCOPIC
BILIRUBIN URINE: NEGATIVE
Glucose, UA: NEGATIVE mg/dL
Hgb urine dipstick: NEGATIVE
Ketones, ur: NEGATIVE mg/dL
Leukocytes, UA: NEGATIVE
NITRITE: NEGATIVE
PH: 6 (ref 5.0–8.0)
Protein, ur: NEGATIVE mg/dL
SPECIFIC GRAVITY, URINE: 1.011 (ref 1.005–1.030)

## 2015-09-24 LAB — I-STAT TROPONIN, ED: TROPONIN I, POC: 0 ng/mL (ref 0.00–0.08)

## 2015-09-24 LAB — CBC
HEMATOCRIT: 37.6 % (ref 36.0–46.0)
HEMOGLOBIN: 12.8 g/dL (ref 12.0–15.0)
MCH: 30 pg (ref 26.0–34.0)
MCHC: 34 g/dL (ref 30.0–36.0)
MCV: 88.1 fL (ref 78.0–100.0)
Platelets: 226 10*3/uL (ref 150–400)
RBC: 4.27 MIL/uL (ref 3.87–5.11)
RDW: 13.5 % (ref 11.5–15.5)
WBC: 9.1 10*3/uL (ref 4.0–10.5)

## 2015-09-24 LAB — LIPASE, BLOOD: LIPASE: 16 U/L (ref 11–51)

## 2015-09-24 LAB — COMPREHENSIVE METABOLIC PANEL
ALT: 51 U/L (ref 14–54)
ANION GAP: 8 (ref 5–15)
AST: 29 U/L (ref 15–41)
Albumin: 4.2 g/dL (ref 3.5–5.0)
Alkaline Phosphatase: 99 U/L (ref 38–126)
BILIRUBIN TOTAL: 0.9 mg/dL (ref 0.3–1.2)
BUN: 16 mg/dL (ref 6–20)
CO2: 26 mmol/L (ref 22–32)
Calcium: 9 mg/dL (ref 8.9–10.3)
Chloride: 97 mmol/L — ABNORMAL LOW (ref 101–111)
Creatinine, Ser: 0.65 mg/dL (ref 0.44–1.00)
Glucose, Bld: 107 mg/dL — ABNORMAL HIGH (ref 65–99)
POTASSIUM: 3.3 mmol/L — AB (ref 3.5–5.1)
Sodium: 131 mmol/L — ABNORMAL LOW (ref 135–145)
TOTAL PROTEIN: 7.1 g/dL (ref 6.5–8.1)

## 2015-09-24 MED ORDER — POTASSIUM CHLORIDE CRYS ER 20 MEQ PO TBCR: 20.0000 meq | EXTENDED_RELEASE_TABLET | Freq: Once | ORAL | Status: DC

## 2015-09-24 MED ORDER — ONDANSETRON 4 MG PO TBDP
4.0000 mg | ORAL_TABLET | Freq: Once | ORAL | Status: AC | PRN
  Administered 2015-09-24: 4 mg via ORAL
  Filled 2015-09-24: qty 1

## 2015-09-24 NOTE — ED Provider Notes (Signed)
CSN: [id number]     Arrival date & time 09/24/15  [id number] History   First MD Initiated Contact with Patient 09/24/15 [phone]     Chief Complaint  Patient presents with  . Dry Mouth   . Hypertension  . Nausea     (Consider location/radiation/quality/duration/timing/severity/associated sxs/prior Treatment) HPI [name] is a 60 y.o. female with history of hypertension comes in for evaluation of elevated blood pressure. Patient reports that approximately 2:30 AM this morning, she woke up feeling anxious and noted her blood pressure to be [id number] systolic. She denies any associated headache, vision changes, chest pain, shortness of breath. She denies any changes in urinary habits. Symptoms resolved prior to arrival in emergency department. Patient's blood pressure is managed by her PCP. She does report while sitting in the waiting room feeling a burning sensation in her chest, is unsure if this is related to her chronic left shoulder injury, but reports she has experienced it in the past. Chest burning was fleeting in nature and there was no associated shortness of breath, nausea or vomiting, numbness or weakness. States that she drinks roughly 12 cups of water per day. Nothing makes this problem better or worse. No other Modifying factors.  Past Medical History  Diagnosis Date  . Depression   . Hypothyroidism   . Headache(784.0)     hx of migraines when younger  . Fibromyalgia   . Hypertension   . Heart palpitations     hx with normal holter event monitoring  . Hx: UTI (urinary tract infection)   . Skin cancer     basal, squamous cell  . Atrial septal aneurysm / if pfo  echo 6 13  10/23/2011  . Fatty liver     "pre fatty liver"  . Pneumonia 1972    hx of  . GERD (gastroesophageal reflux disease)     hx of, none in a long time  . 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 Anal fissure   . Polyp of colon   . Obesity    Past Surgical History  Procedure Laterality Date  . Tonsillectomy    .  Ectopic pregnancy surgery  1983  . Knee arthroscopy      both in past  . Tonsillectomy  1971  . Skin cancer excision Bilateral     arm, legs, and chest  . Dilation and curettage of uterus  1978  . Shoulder open rotator cuff repair Left 07/07/2013    Procedure: LEFT SHOULDER MINI OPEN SUBACROMIAL DECOMPRESSION ROTATOR CUFF REPAIR AND POSSIBLE PATCH GRAFT ;  Surgeon: Johnn Hai, MD;  Location: [facility];  Service: Orthopedics;  Laterality: Left;  with interscaline block   Family History  Problem Relation Age of Onset  . Hypertension Mother     low borderline  . Osteoporosis Mother   . Hypertension Father   . Liver disease Father     amyloid deceased  . Hyperlipidemia Father   . Juvenile Diabetes Daughter   . ADD / ADHD Child   . Hyperlipidemia      Maternal grandmother  . Melanoma Sister   . Cancer Sister   . Colon cancer Neg Hx   . Stomach cancer Neg Hx    Social History  Substance Use Topics  . Smoking status: Never Smoker   . Smokeless tobacco: Never Used  . Alcohol Use: Yes     Comment: rare   OB History    Gravida Para Term Preterm AB TAB SAB Ectopic Multiple  Living   8 6 0 0 1 0 0 1 0 6      Review of Systems A 10 point review of systems was completed and was negative except for pertinent positives and negatives as mentioned in the history of present illness     Allergies  Norvasc; Cefdinir; Ibuprofen; Penicillins; and Tylenol  Home Medications   Prior to Admission medications   Medication Sig Start Date End Date Taking? Authorizing Provider  Ascorbic Acid (VITAMIN C) 1000 MG tablet Take 1,000 mg by mouth daily.   Yes Historical Provider, MD  Cholecalciferol (VITAMIN D3) 10000 UNITS capsule Take 10,000 Units by mouth daily.   Yes Historical Provider, MD  DHEA 10 MG TABS Take 1 tablet by mouth daily.    Yes Historical Provider, MD  HYDROcodone-homatropine (HYCODAN) 5-1.5 MG/5ML syrup Take 5 mLs by mouth every 6 (six) hours as needed for cough. 08/20/15  Yes  Elby Beck, FNP  Iodine, Kelp, TABS Take 1 tablet by mouth daily.   Yes Historical Provider, MD  Javier Docker Oil 1000 MG CAPS Take 1 capsule by mouth 2 (two) times daily.   Yes Historical Provider, MD  Magnesium 250 MG TABS Take 2 tablets by mouth daily.   Yes Historical Provider, MD  Multiple Vitamin (MULTIVITAMIN WITH MINERALS) TABS tablet Take 1 tablet by mouth daily.   Yes Historical Provider, MD  NATURE-THROID 16.25 MG TABS Take 1 tablet by mouth daily.  07/17/14  Yes Historical Provider, MD  NATURE-THROID 97.5 MG TABS Take 1 tablet by mouth daily.  07/17/14  Yes Historical Provider, MD  OVER THE COUNTER MEDICATION Take 2 tablets by mouth 2 (two) times daily. Reported on 08/20/2015   Yes Historical Provider, MD  potassium gluconate 595 MG TABS tablet Take 595 mg by mouth daily.   Yes Historical Provider, MD  Probiotic Product (PROBIOTIC PO) Take 1 capsule by mouth daily.    Yes Historical Provider, MD  Progesterone Micronized (PROGESTERONE PO) Take 150 mg by mouth daily.    Yes Historical Provider, MD  Ubiquinol 100 MG CAPS Take 1 capsule by mouth daily.   Yes Historical Provider, MD  valsartan-hydrochlorothiazide (DIOVAN-HCT) 320-25 MG tablet TAKE 1 TABLET BY MOUTH DAILY. 08/21/15  Yes Burnis Medin, MD  VITAMIN K PO Take 400 mcg by mouth daily.   Yes Historical Provider, MD  NON FORMULARY Reported on 08/20/2015    Historical Provider, MD  verapamil (CALAN-SR) 120 MG CR tablet Take 1 tablet (120 mg total) by mouth at bedtime. 03/28/14   Burnis Medin, MD   BP 131/81 mmHg  Pulse 74  Temp(Src) 97.8 F (36.6 C) (Oral)  Resp 18  Ht 5\' 6"  (1.676 m)  Wt 92.987 kg  BMI 33.10 kg/m2  SpO2 100% Physical Exam  Constitutional: She is oriented to person, place, and time. She appears well-developed and well-nourished.  HENT:  Head: Normocephalic and atraumatic.  Mouth/Throat: Oropharynx is clear and moist.  Eyes: Conjunctivae are normal. Pupils are equal, round, and reactive to light. Right eye  exhibits no discharge. Left eye exhibits no discharge. No scleral icterus.  Neck: Neck supple.  Cardiovascular: Normal rate, regular rhythm and normal heart sounds.   Pulmonary/Chest: Effort normal and breath sounds normal. No respiratory distress. She has no wheezes. She has no rales.  Abdominal: Soft. There is no tenderness.  Musculoskeletal: She exhibits no tenderness.  Neurological: She is alert and oriented to person, place, and time.  Cranial Nerves II-XII grossly intact  Skin: Skin is warm  and dry. No rash noted.  Psychiatric: She has a normal mood and affect.  Nursing note and vitals reviewed.   ED Course  Procedures (including critical care time) Labs Review Labs Reviewed  COMPREHENSIVE METABOLIC PANEL - Abnormal; Notable for the following:    Sodium 131 (*)    Potassium 3.3 (*)    Chloride 97 (*)    Glucose, Bld 107 (*)    All other components within normal limits  CBC  URINALYSIS, ROUTINE W REFLEX MICROSCOPIC (NOT AT [facility])  LIPASE, BLOOD  I-STAT TROPOININ, ED    Imaging Review No results found. I have personally reviewed and evaluated these images and lab results as part of my medical decision-making.   EKG Interpretation   Date/Time:  Monday Sep 24 2015 08:16:42 EDT Ventricular Rate:  75 PR Interval:  177 QRS Duration: 116 QT Interval:  414 QTC Calculation: 462 R Axis:   -43 Text Interpretation:  Sinus rhythm Nonspecific IVCD with LAD Borderline T  abnormalities, inferior leads T wave flattening compared to previous  tracing Confirmed by Alfonse Spruce, EMILY ([id number]) on 09/24/2015 8:28:05 AM      MDM  Keturah Morris is a 60 y.o. female with history of hypertension here for isolated elevated blood pressure. Blood pressure on arrival 148/78. She is asymptomatic. Chest burning sounds very atypical for ACS, however will obtain a troponin/EKG. If negative, anticipate discharge to follow-up with PCP.  Screening labs obtained in triage are not concerning. She has a  mild decrease in sodium, potassium and chloride, suspect possibly due to dilution. EKG reassuring. Low suspicion for ACS. Recommended follow-up with PCP 2 days.Encourage adding Gatorade or other electrolyte balanced solution to hydration strategy. Final diagnoses:  Elevated blood pressure reading        Comer Locket, PA-C 09/24/15 [place], MD 09/26/15 [phone]

## 2015-09-24 NOTE — ED Notes (Signed)
LABS OBTAINED BY Roderic Palau RN

## 2015-09-24 NOTE — Telephone Encounter (Signed)
[facility] - Client [place] Patient Name: JEREMIE SALTARELLI Gender: Female DOB: Aug 14, 1955 Age: 60 Y 73 M 7 D Return Phone Number: [phone] (Primary), [phone] (Secondary) Address: City/State/Zip: [place] Client [facility] - Client Client Site [facility] - Night Physician Shanon Ace - MD Contact Type Call Who Is Calling Patient / Member / Family / Caregiver Call Type Triage / Clinical Relationship To Patient Self Return Phone Number [phone] (Primary) Chief Complaint Heart palpitations or irregular heartbeat Reason for Call Symptomatic / Request for [place] states her BP is 171/99 with a pulse is 99 and feels like her heart is racing. PreDisposition InappropriateToAsk Translation No Nurse Assessment Nurse: [place] Desanctis, RN, Amy Date/Time (Eastern Time): 09/24/2015 2:32:21 AM Confirm and document reason for call. If symptomatic, describe symptoms. You must click the next button to save text entered. ---Caller states her BP is 171/99 with a pulse is 99 and feels like her heart is beating hard. States it started yesterday. Recently went off verapamil. Still on valsartan with hcl. No other symptoms. Has the patient traveled out of the country within the last 30 days? ---No Does the patient have any new or worsening symptoms? ---Yes Will a triage be completed? ---Yes Related visit to physician within the last 2 weeks? ---No Does the PT have any chronic conditions? (i.e. diabetes, asthma, etc.) ---Yes List chronic conditions. ---htn, recent shoulder surgery 1 1/2 yr ago. Is this a behavioral health or substance abuse call? ---No Guidelines Guideline Title Affirmed Question Affirmed Notes Nurse Date/Time (Eastern Time) High Blood Pressure [1] BP # 160 / 100 AND [2] cardiac or neurologic symptoms (e.g., chest pain,  difficulty breathing, unsteady gait, blurred vision) [place] Desanctis, RN, Amy 09/24/2015 2:34:56 AM Disp. Time Eilene Ghazi Time) Disposition Final User PLEASE NOTE: All timestamps contained within this report are represented as [place] Standard Time. CONFIDENTIALTY NOTICE: This fax transmission is intended only for the addressee. It contains information that is legally privileged, confidential or otherwise protected from use or disclosure. If you are not the intended recipient, you are strictly prohibited from reviewing, disclosing, copying using or disseminating any of this information or taking any action in reliance on or regarding this information. If you have received this fax in error, please notify us immediately by telephone so that we can arrange for its return to [place]. Phone: [phone], Toll-Free: [phone], Fax: [phone] Page: 2 of 2 Call Id: [id number] 09/24/2015 2:37:41 AM Go to ED Now Yes [place] Desanctis, RN, Amy Caller Understands: Yes Disagree/Comply: Comply Care Advice Given Per Guideline GO TO ED NOW: You need to be seen in the Emergency Department. Go to the ER at ___________ [place] now. Drive carefully. NOTE TO TRIAGER - DRIVING: * Another adult should drive. * If immediate transportation is not available via car or taxi, then the patient should be instructed to call [id number]. CARE ADVICE given per High Blood Pressure (Adult) guideline. CALL EMS 911 IF: * Patient passes out, starts acting confused or becomes too weak to stand. * You become worse. Referrals GO TO FACILITY UNDECIDED

## 2015-09-24 NOTE — Telephone Encounter (Signed)
Dr Regis Bill has ordered labs for pt in 2 weeks, but no order in.  Can you put the order in?

## 2015-09-24 NOTE — Telephone Encounter (Signed)
Pt seen in ED 

## 2015-09-24 NOTE — Progress Notes (Signed)
Chief Complaint  Patient presents with  . Hypertension    elevated BP yesterday and today. Pt states she went to [facility] this am for her BP    HPI: Terrel Noakes 60 y.o.  Went to ed middle of night when awoke with palpitations  And noted bp was very high   Has had burning luc p ? From shoulder or not  TH referred to ed.  Neg eval there with bp ok .  records of her bp readings since stopping the verapamil in Jan  Ok  Except ocass up  .   No osa sx .  Had been off   Verapamil in past  mponths   Has ? About package insert from  Divan hctz including potassium ( she is taking otc pot)  Eye issues  Sulfa allergynd leg cramps and eye findings seh takes mag supp not recently.   ROS: See pertinent positives and negatives per HPI. No sob  Syncope  Ms issues   Cathi Roan says she should ask for  Long term disability   Dr bean declines except as needed .  No jobs avaialbe for her in the [employer]  Past Medical History  Diagnosis Date  . Depression   . Hypothyroidism   . Headache(784.0)     hx of migraines when younger  . Fibromyalgia   . Hypertension   . Heart palpitations     hx with normal holter event monitoring  . Hx: UTI (urinary tract infection)   . Skin cancer     basal, squamous cell  . Atrial septal aneurysm / if pfo  echo 6 13  10/23/2011  . Fatty liver     "pre fatty liver"  . Pneumonia 1972    hx of  . GERD (gastroesophageal reflux disease)     hx of, none in a long time  . 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 Anal fissure   . Polyp of colon   . Obesity     Family History  Problem Relation Age of Onset  . Hypertension Mother     low borderline  . Osteoporosis Mother   . Hypertension Father   . Liver disease Father     amyloid deceased  . Hyperlipidemia Father   . Juvenile Diabetes Daughter   . ADD / ADHD Child   . Hyperlipidemia      Maternal grandmother  . Melanoma Sister   . Cancer Sister   . Colon cancer Neg Hx   . Stomach cancer Neg Hx     Social History    Social History  . Marital Status: Married    Spouse Name: N/A  . Number of Children: 6  . Years of Education: N/A   Occupational History  . TEACHER [employer]    Social History Main Topics  . Smoking status: Never Smoker   . Smokeless tobacco: Never Used  . Alcohol Use: Yes     Comment: rare  . Drug Use: No  . Sexual Activity: Not Asked   Other Topics Concern  . 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 15 left shoulder surgery    Married   Special educ   HH of 4   3 outside dogs, 8 goats, 40 chickens and quail. [place]   [place]   Ex husband passes away   Not working after injur at school  Shoulder neck  Outpatient Prescriptions Prior to Visit  Medication Sig Dispense Refill  . Ascorbic Acid (VITAMIN C) 1000 MG tablet Take 1,000 mg by mouth daily.    . Cholecalciferol (VITAMIN D3) 10000 UNITS capsule Take 10,000 Units by mouth daily.    Marland Kitchen DHEA 10 MG TABS Take 1 tablet by mouth daily.     . Iodine, Kelp, TABS Take 1 tablet by mouth daily.    Javier Docker Oil 1000 MG CAPS Take 1 capsule by mouth 2 (two) times daily.    . Magnesium 250 MG TABS Take 2 tablets by mouth daily.    . Multiple Vitamin (MULTIVITAMIN WITH MINERALS) TABS tablet Take 1 tablet by mouth daily.    Marland Kitchen NATURE-THROID 16.25 MG TABS Take 1 tablet by mouth daily.     Marland Kitchen NATURE-THROID 97.5 MG TABS Take 1 tablet by mouth daily.     . Probiotic Product (PROBIOTIC PO) Take 1 capsule by mouth daily.     . Progesterone Micronized (PROGESTERONE PO) Take 150 mg by mouth daily.     Marland Kitchen Ubiquinol 100 MG CAPS Take 1 capsule by mouth daily.    . valsartan-hydrochlorothiazide (DIOVAN-HCT) 320-25 MG tablet TAKE 1 TABLET BY MOUTH DAILY. 90 tablet 2  . VITAMIN K PO Take 400 mcg by mouth daily.    . potassium gluconate 595 MG TABS tablet Take 595 mg by mouth daily.    Marland Kitchen OVER THE COUNTER MEDICATION Take 2 tablets by mouth 2 (two)  times daily. Reported on 08/20/2015    . HYDROcodone-homatropine (HYCODAN) 5-1.5 MG/5ML syrup Take 5 mLs by mouth every 6 (six) hours as needed for cough. (Patient not taking: Reported on 09/24/2015) 120 mL 0  . NON FORMULARY Reported on 08/20/2015    . verapamil (CALAN-SR) 120 MG CR tablet Take 1 tablet (120 mg total) by mouth at bedtime. (Patient not taking: Reported on 09/24/2015) 90 tablet 3   No facility-administered medications prior to visit.     EXAM:  BP 122/82 mmHg  Pulse 79  Temp(Src) 98.3 F (36.8 C) (Oral)  Ht 5\' 6"  (1.676 m)  Wt 206 lb (93.441 kg)  BMI 33.27 kg/m2  SpO2 98%  Body mass index is 33.27 kg/(m^2).  GENERAL: vitals reviewed and listed above, alert, oriented, appears well hydrated and in no acute distress HEENT: atraumatic, conjunctiva  clear, no obvious abnormalities on inspection of external nose and ears NECK: no obvious masses on inspection palpation  LUNGS: clear to auscultation bilaterally, no wheezes, rales or rhonchi, good air movement CV: HRRR, no clubbing cyanosis or  peripheral edema nl cap refill  MS: moves all extremities without noticeable focal  abnormality PSYCH: pleasant and cooperative, no obvious depression or anxiety Lab Results  Component Value Date   WBC 9.1 09/24/2015   HGB 12.8 09/24/2015   HCT 37.6 09/24/2015   PLT 226 09/24/2015   GLUCOSE 107* 09/24/2015   CHOL 243* 06/13/2015   TRIG 59.0 06/13/2015   HDL 59.40 06/13/2015   LDLDIRECT 165.2 12/27/2012   LDLCALC 172* 06/13/2015   ALT 51 09/24/2015   AST 29 09/24/2015   NA 131* 09/24/2015   K 3.3* 09/24/2015   CL 97* 09/24/2015   CREATININE 0.65 09/24/2015   BUN 16 09/24/2015   CO2 26 09/24/2015   TSH 1.59 06/13/2015   HGBA1C 5.2 06/01/2013  reviewed her log  Of bop over last months  Most in range  ASSESSMENT AND PLAN:  Discussed the following assessment and plan:  Atypical chest pain  Essential hypertension  Hypokalemia  Hyponatremia  Intermittent palpitations  - nocturnal x 1 with bp up  Labile hypertension  Cramps of lower extremity, unspecified laterality Many ?s   about issues in    Package insert for her bp meds    And other   Leg cramps could be from pot or med itself  Cannot do a long term disability for  Problem  Managed by a specialist and is ortho  .    She would like to be trained in other jobs that wouldn't aggravate her other ortho issues -Patient advised to return or notify health care team  if symptoms worsen ,persist or new concerns arise. Total visit 78mins > 50% spent counseling and coordinating care as indicated in above note and in instructions to patient .     Patient Instructions  Stop the otc potassium  And we will begin a  Prescription  potassium You can take your  Magnesium .  As you are taking and  Bring in bottle   To visit.  .    Labs  In 2 weeks then ov  To decide  On plan   Diuretics can cause leg cramps from low potassium and magnesium but also in their own right.       Standley Brooking. Yvan Dority M.D.

## 2015-09-24 NOTE — Patient Instructions (Signed)
Stop the otc potassium  And we will begin a  Prescription  potassium You can take your  Magnesium .  As you are taking and  Bring in bottle   To visit.  .    Labs  In 2 weeks then ov  To decide  On plan   Diuretics can cause leg cramps from low potassium and magnesium but also in their own right.

## 2015-09-24 NOTE — Discharge Instructions (Signed)
There is not appear to be an emergent cause for your symptoms at this time. Your exam was reassuring. Her labs, EKG were also reassuring. Please follow-up with your doctor in 1-2 days for reevaluation. Return to ED for new or worsening symptoms as we discussed.  Hypertension Hypertension, commonly called high blood pressure, is when the force of blood pumping through your arteries is too strong. Your arteries are the blood vessels that carry blood from your heart throughout your body. A blood pressure reading consists of a higher number over a lower number, such as 110/72. The higher number (systolic) is the pressure inside your arteries when your heart pumps. The lower number (diastolic) is the pressure inside your arteries when your heart relaxes. Ideally you want your blood pressure below 120/80. Hypertension forces your heart to work harder to pump blood. Your arteries may become narrow or stiff. Having untreated or uncontrolled hypertension can cause heart attack, stroke, kidney disease, and other problems. RISK FACTORS Some risk factors for high blood pressure are controllable. Others are not.  Risk factors you cannot control include:   Race. You may be at higher risk if you are African American.  Age. Risk increases with age.  Gender. Men are at higher risk than women before age 42 years. After age 54, women are at higher risk than men. Risk factors you can control include:  Not getting enough exercise or physical activity.  Being overweight.  Getting too much fat, sugar, calories, or salt in your diet.  Drinking too much alcohol. SIGNS AND SYMPTOMS Hypertension does not usually cause signs or symptoms. Extremely high blood pressure (hypertensive crisis) may cause headache, anxiety, shortness of breath, and nosebleed. DIAGNOSIS To check if you have hypertension, your health care provider will measure your blood pressure while you are seated, with your arm held at the level of your  heart. It should be measured at least twice using the same arm. Certain conditions can cause a difference in blood pressure between your right and left arms. A blood pressure reading that is higher than normal on one occasion does not mean that you need treatment. If it is not clear whether you have high blood pressure, you may be asked to return on a different day to have your blood pressure checked again. Or, you may be asked to monitor your blood pressure at home for 1 or more weeks. TREATMENT Treating high blood pressure includes making lifestyle changes and possibly taking medicine. Living a healthy lifestyle can help lower high blood pressure. You may need to change some of your habits. Lifestyle changes may include:  Following the DASH diet. This diet is high in fruits, vegetables, and whole grains. It is low in salt, red meat, and added sugars.  Keep your sodium intake below 2,300 mg per day.  Getting at least 30-45 minutes of aerobic exercise at least 4 times per week.  Losing weight if necessary.  Not smoking.  Limiting alcoholic beverages.  Learning ways to reduce stress. Your health care provider may prescribe medicine if lifestyle changes are not enough to get your blood pressure under control, and if one of the following is true:  You are 48-24 years of age and your systolic blood pressure is above 140.  You are 64 years of age or older, and your systolic blood pressure is above 150.  Your diastolic blood pressure is above 90.  You have diabetes, and your systolic blood pressure is over [id number] or your diastolic blood pressure  is over 90.  You have kidney disease and your blood pressure is above 140/90.  You have heart disease and your blood pressure is above 140/90. Your personal target blood pressure may vary depending on your medical conditions, your age, and other factors. HOME CARE INSTRUCTIONS  Have your blood pressure rechecked as directed by your health care  provider.   Take medicines only as directed by your health care provider. Follow the directions carefully. Blood pressure medicines must be taken as prescribed. The medicine does not work as well when you skip doses. Skipping doses also puts you at risk for problems.  Do not smoke.   Monitor your blood pressure at home as directed by your health care provider. SEEK MEDICAL CARE IF:   You think you are having a reaction to medicines taken.  You have recurrent headaches or feel dizzy.  You have swelling in your ankles.  You have trouble with your vision. SEEK IMMEDIATE MEDICAL CARE IF:  You develop a severe headache or confusion.  You have unusual weakness, numbness, or feel faint.  You have severe chest or abdominal pain.  You vomit repeatedly.  You have trouble breathing. MAKE SURE YOU:   Understand these instructions.  Will watch your condition.  Will get help right away if you are not doing well or get worse.   This information is not intended to replace advice given to you by your health care provider. Make sure you discuss any questions you have with your health care provider.   Document Released: 04/28/2005 Document Revised: 09/12/2014 Document Reviewed: 02/18/2013 Elsevier Interactive Patient Education [employer].

## 2015-09-25 ENCOUNTER — Ambulatory Visit: Payer: Self-pay | Admitting: Internal Medicine

## 2015-09-25 ENCOUNTER — Other Ambulatory Visit: Payer: Self-pay | Admitting: Family Medicine

## 2015-09-25 DIAGNOSIS — E876 Hypokalemia: Secondary | ICD-10-CM

## 2015-09-25 NOTE — Telephone Encounter (Signed)
Orders placed.

## 2015-10-10 ENCOUNTER — Other Ambulatory Visit (INDEPENDENT_AMBULATORY_CARE_PROVIDER_SITE_OTHER): Payer: BC Managed Care – PPO

## 2015-10-10 DIAGNOSIS — E876 Hypokalemia: Secondary | ICD-10-CM | POA: Diagnosis not present

## 2015-10-10 LAB — BASIC METABOLIC PANEL
BUN: 16 mg/dL (ref 6–23)
CALCIUM: 10 mg/dL (ref 8.4–10.5)
CO2: 28 meq/L (ref 19–32)
Chloride: 104 mEq/L (ref 96–112)
Creatinine, Ser: 0.86 mg/dL (ref 0.40–1.20)
GFR: 71.13 mL/min (ref >60.00)
GLUCOSE: 85 mg/dL (ref 70–99)
Potassium: 3.9 mEq/L (ref 3.5–5.1)
SODIUM: 140 meq/L (ref 135–145)

## 2015-10-10 LAB — MAGNESIUM: Magnesium: 2.4 mg/dL (ref 1.5–2.5)

## 2015-10-11 NOTE — Progress Notes (Signed)
Chief Complaint  Patient presents with  . Follow-up    OVER LABS     HPI: [name] 60 y.o.  Fu leg cramps low k and HT    154  84  Then   112/67  Leg cramps   Better most days except for  Prev   issue  She has a log of bp reading tend to be up in am and very good repeat  118 120 range otherwise . Would like to dec pill count   At some point   No new sx  ROS: See pertinent positives and negatives per HPI.  Past Medical History  Diagnosis Date  . Depression   . Hypothyroidism   . Headache(784.0)     hx of migraines when younger  . Fibromyalgia   . Hypertension   . Heart palpitations     hx with normal holter event monitoring  . Hx: UTI (urinary tract infection)   . Skin cancer     basal, squamous cell  . Atrial septal aneurysm / if pfo  echo 6 13  10/23/2011  . Fatty liver     "pre fatty liver"  . Pneumonia 1972    hx of  . GERD (gastroesophageal reflux disease)     hx of, none in a long time  . 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 Anal fissure   . Polyp of colon   . Obesity     Family History  Problem Relation Age of Onset  . Hypertension Mother     low borderline  . Osteoporosis Mother   . Hypertension Father   . Liver disease Father     amyloid deceased  . Hyperlipidemia Father   . Juvenile Diabetes Daughter   . ADD / ADHD Child   . Hyperlipidemia      Maternal grandmother  . Melanoma Sister   . Cancer Sister   . Colon cancer Neg Hx   . Stomach cancer Neg Hx     Social History   Social History  . Marital Status: Married    Spouse Name: N/A  . Number of Children: 6  . Years of Education: N/A   Occupational History  . TEACHER [employer]    Social History Main Topics  . Smoking status: Never Smoker   . Smokeless tobacco: Never Used  . Alcohol Use: Yes     Comment: rare  . Drug Use: No  . Sexual Activity: Not Asked   Other Topics Concern  . None   Social History Narrative   Te[employer] A[employer]  Not workking since last  February . 15 left shoulder surgery    Married   Special educ   HH of 4   3 outside dogs, 8 goats, 40 chickens and [place]ail. [place]   [place]   Ex husband passes away   Not working after injur at school  Shoulder neck                    Outpatient Prescriptions Prior to Visit  Medication Sig Dispense Refill  . Ascorbic Acid (VITAMIN C) 1000 MG tablet Take 1,000 mg by mouth daily.    . Cholecalciferol (VITAMIN D3) 10000 UNITS capsule Take 10,000 Units by mouth daily.    Marland Kitchen DHEA 10 MG TABS Take 1 tablet by mouth daily.     . Iodine, Kelp, TABS Take 1 tablet by mouth daily.    Astrid Drafts  1000 MG CAPS Take 1 capsule by mouth 2 (two) times daily.    . Magnesium 250 MG TABS Take 2 tablets by mouth daily.    . Multiple Vitamin (MULTIVITAMIN WITH MINERALS) TABS tablet Take 1 tablet by mouth daily.    Marland Kitchen NATURE-THROID 16.25 MG TABS Take 1 tablet by mouth daily.     Marland Kitchen NATURE-THROID 97.5 MG TABS Take 1 tablet by mouth daily.     Marland Kitchen OVER THE COUNTER MEDICATION Take 2 tablets by mouth 2 (two) times daily. Reported on 08/20/2015    . potassium chloride SA (K-DUR,KLOR-CON) 20 MEQ tablet Take 1 tablet (20 mEq total) by mouth once. 30 tablet 3  . Probiotic Product (PROBIOTIC PO) Take 1 capsule by mouth daily.     . Progesterone Micronized (PROGESTERONE PO) Take 150 mg by mouth daily.     Marland Kitchen Ubiquinol 100 MG CAPS Take 1 capsule by mouth daily.    . valsartan-hydrochlorothiazide (DIOVAN-HCT) 320-25 MG tablet TAKE 1 TABLET BY MOUTH DAILY. 90 tablet 2  . VITAMIN K PO Take 400 mcg by mouth daily.     No facility-administered medications prior to visit.     EXAM:  BP 120/70 mmHg  Pulse 82  Temp(Src) 98.2 F (36.8 C) (Oral)  Ht 5\' 6"  (1.676 m)  Wt 203 lb 9.6 oz (92.352 kg)  BMI 32.88 kg/m2  SpO2 98%  Body mass index is 32.88 kg/(m^2).  GENERAL: vitals reviewed and listed above, alert, oriented, appears well hydrated and in no acute distress HEENT: atraumatic, conjunctiva   clear, no obvious abnormalities on inspection of external nose and ears PSYCH: pleasant and cooperative, no obvious depression or anxiety Lab Results  Component Value Date   WBC 9.1 09/24/2015   HGB 12.8 09/24/2015   HCT 37.6 09/24/2015   PLT 226 09/24/2015   GLUCOSE 85 10/10/2015   CHOL 243* 06/13/2015   TRIG 59.0 06/13/2015   HDL 59.40 06/13/2015   LDLDIRECT 165.2 12/27/2012   LDLCALC 172* 06/13/2015   ALT 51 09/24/2015   AST 29 09/24/2015   NA 140 10/10/2015   K 3.9 10/10/2015   CL 104 10/10/2015   CREATININE 0.86 10/10/2015   BUN 16 10/10/2015   CO2 28 10/10/2015   TSH 1.59 06/13/2015   HGBA1C 5.2 06/01/2013   BP Readings from Last 3 Encounters:  10/12/15 120/70  09/24/15 122/82  09/24/15 131/80    ASSESSMENT AND PLAN:  Discussed the following assessment and plan:  Labile hypertension  Hypokalemia  Essential hypertension - inc in am at times othewise controlled   Cramps of lower extremity, unspecified laterality - better on potassium  try taking med at night to see if am bp controlled better. Rest of day seems great.  The can try dec the hctz component to 12. 5  With separate rxs     If bp controlled can try off the diuretic all together and stay on Diovan 320. ( off potassium at that point)  Plan rov in 3 months to review  Can message in interim  As  We proceed with plan with ? Etc.  -Patient advised to return or notify health care team  if symptoms worsen ,persist or new concerns arise.  Patient Instructions  Consider  Trying    Dec diuretic.  In the diovan    Try taking med   At night to see if covers  bp in am .  Stay on the potassium for now.     Standley Brooking. Panosh M.D.

## 2015-10-12 ENCOUNTER — Ambulatory Visit (INDEPENDENT_AMBULATORY_CARE_PROVIDER_SITE_OTHER): Payer: BC Managed Care – PPO | Admitting: Internal Medicine

## 2015-10-12 ENCOUNTER — Encounter: Payer: Self-pay | Admitting: Internal Medicine

## 2015-10-12 VITALS — BP 120/70 | HR 82 | Temp 98.2°F | Ht 66.0 in | Wt 203.6 lb

## 2015-10-12 DIAGNOSIS — R252 Cramp and spasm: Secondary | ICD-10-CM

## 2015-10-12 DIAGNOSIS — R0989 Other specified symptoms and signs involving the circulatory and respiratory systems: Secondary | ICD-10-CM

## 2015-10-12 DIAGNOSIS — I10 Essential (primary) hypertension: Secondary | ICD-10-CM | POA: Diagnosis not present

## 2015-10-12 DIAGNOSIS — E876 Hypokalemia: Secondary | ICD-10-CM

## 2015-10-12 MED ORDER — HYDROCHLOROTHIAZIDE 25 MG PO TABS: 25.0000 mg | ORAL_TABLET | Freq: Every day | ORAL | Status: DC

## 2015-10-12 MED ORDER — VALSARTAN 320 MG PO TABS: 320.0000 mg | ORAL_TABLET | Freq: Every day | ORAL | Status: DC

## 2015-10-12 NOTE — Patient Instructions (Signed)
Consider  Trying    Dec diuretic.  In the diovan    Try taking med   At night to see if covers  bp in am .  Stay on the potassium for now.

## 2015-11-20 DIAGNOSIS — M545 Low back pain: Secondary | ICD-10-CM | POA: Diagnosis not present

## 2015-11-20 DIAGNOSIS — M542 Cervicalgia: Secondary | ICD-10-CM | POA: Diagnosis not present

## 2015-11-20 DIAGNOSIS — M546 Pain in thoracic spine: Secondary | ICD-10-CM | POA: Diagnosis not present

## 2015-11-21 DIAGNOSIS — Z1231 Encounter for screening mammogram for malignant neoplasm of breast: Secondary | ICD-10-CM | POA: Diagnosis not present

## 2015-11-21 DIAGNOSIS — Z13820 Encounter for screening for osteoporosis: Secondary | ICD-10-CM | POA: Diagnosis not present

## 2015-11-21 DIAGNOSIS — Z6834 Body mass index (BMI) 34.0-34.9, adult: Secondary | ICD-10-CM | POA: Diagnosis not present

## 2015-11-21 DIAGNOSIS — Z01419 Encounter for gynecological examination (general) (routine) without abnormal findings: Secondary | ICD-10-CM | POA: Diagnosis not present

## 2015-11-23 DIAGNOSIS — H1132 Conjunctival hemorrhage, left eye: Secondary | ICD-10-CM | POA: Diagnosis not present

## 2015-12-13 DIAGNOSIS — M545 Low back pain: Secondary | ICD-10-CM | POA: Diagnosis not present

## 2015-12-13 DIAGNOSIS — M546 Pain in thoracic spine: Secondary | ICD-10-CM | POA: Diagnosis not present

## 2015-12-13 DIAGNOSIS — M542 Cervicalgia: Secondary | ICD-10-CM | POA: Diagnosis not present

## 2015-12-14 DIAGNOSIS — E782 Mixed hyperlipidemia: Secondary | ICD-10-CM | POA: Diagnosis not present

## 2015-12-26 ENCOUNTER — Telehealth: Payer: Self-pay | Admitting: Internal Medicine

## 2015-12-26 ENCOUNTER — Other Ambulatory Visit: Payer: Self-pay | Admitting: Family Medicine

## 2015-12-26 DIAGNOSIS — R252 Cramp and spasm: Secondary | ICD-10-CM

## 2015-12-26 DIAGNOSIS — E039 Hypothyroidism, unspecified: Secondary | ICD-10-CM | POA: Diagnosis not present

## 2015-12-26 DIAGNOSIS — J309 Allergic rhinitis, unspecified: Secondary | ICD-10-CM | POA: Diagnosis not present

## 2015-12-26 DIAGNOSIS — M797 Fibromyalgia: Secondary | ICD-10-CM | POA: Diagnosis not present

## 2015-12-26 DIAGNOSIS — R5383 Other fatigue: Secondary | ICD-10-CM | POA: Diagnosis not present

## 2015-12-26 NOTE — Telephone Encounter (Signed)
Am ok ordering bmp and magnesium tests  For  Leg cramps But  Not sure we can order at another site  And we dont usually  Do this because of staffing  . Would have to be approved by that site.   Otherwise can do at [facility] as a walk  in

## 2015-12-26 NOTE — Telephone Encounter (Signed)
Will check with [facility] in the am if ok for labs there.

## 2015-12-26 NOTE — Telephone Encounter (Signed)
Orders have been placed in the system.  Please help the pt make appt

## 2015-12-26 NOTE — Telephone Encounter (Signed)
Pt states she is still not feeling well, tired and would like to have her Potassium and magnesium rechecked.  Pt states Dr Regis Bill is aware of her low potassium, and she has been having leg cramps.  If she can get this order, pt would like to go to the [facility] to have labs done.  Pt is going out of town tomorrow and wants asap

## 2015-12-27 ENCOUNTER — Other Ambulatory Visit (INDEPENDENT_AMBULATORY_CARE_PROVIDER_SITE_OTHER): Payer: BC Managed Care – PPO

## 2015-12-27 DIAGNOSIS — R252 Cramp and spasm: Secondary | ICD-10-CM

## 2015-12-27 LAB — BASIC METABOLIC PANEL
BUN: 13 mg/dL (ref 6–23)
CO2: 27 mEq/L (ref 19–32)
CREATININE: 0.85 mg/dL (ref 0.40–1.20)
Calcium: 9.7 mg/dL (ref 8.4–10.5)
Chloride: 106 mEq/L (ref 96–112)
GFR: 72.04 mL/min (ref >60.00)
Glucose, Bld: 82 mg/dL (ref 70–99)
Potassium: 4.2 mEq/L (ref 3.5–5.1)
Sodium: 141 mEq/L (ref 135–145)

## 2015-12-27 LAB — MAGNESIUM: MAGNESIUM: 2.2 mg/dL (ref 1.5–2.5)

## 2015-12-27 NOTE — Telephone Encounter (Signed)
Pt ok'd by Estill Bamberg to have labs drawn at [facility].

## 2016-01-01 DIAGNOSIS — M542 Cervicalgia: Secondary | ICD-10-CM | POA: Diagnosis not present

## 2016-01-01 DIAGNOSIS — M545 Low back pain: Secondary | ICD-10-CM | POA: Diagnosis not present

## 2016-01-01 DIAGNOSIS — M546 Pain in thoracic spine: Secondary | ICD-10-CM | POA: Diagnosis not present

## 2016-01-02 ENCOUNTER — Encounter: Payer: Self-pay | Admitting: Internal Medicine

## 2016-01-02 ENCOUNTER — Ambulatory Visit (INDEPENDENT_AMBULATORY_CARE_PROVIDER_SITE_OTHER): Payer: BC Managed Care – PPO | Admitting: Internal Medicine

## 2016-01-02 VITALS — BP 146/82 | Temp 98.2°F | Wt 202.6 lb

## 2016-01-02 DIAGNOSIS — Z91048 Other nonmedicinal substance allergy status: Secondary | ICD-10-CM

## 2016-01-02 DIAGNOSIS — Z77120 Contact with and (suspected) exposure to mold (toxic): Secondary | ICD-10-CM

## 2016-01-02 DIAGNOSIS — I10 Essential (primary) hypertension: Secondary | ICD-10-CM | POA: Diagnosis not present

## 2016-01-02 DIAGNOSIS — R0989 Other specified symptoms and signs involving the circulatory and respiratory systems: Secondary | ICD-10-CM

## 2016-01-02 DIAGNOSIS — L299 Pruritus, unspecified: Secondary | ICD-10-CM | POA: Diagnosis not present

## 2016-01-02 NOTE — Progress Notes (Signed)
Pre visit review using our clinic review tool, if applicable. No additional management support is needed unless otherwise documented below in the visit note.  Chief Complaint  Patient presents with  . Skin Problem    Itchy skin.  Exposure to mold in the home.    HPI: [name] 60 y.o. comes in with husband because of 1-2 months of itchiness all over mostly the torso in the head. They just discovered a chronic water leak under the kitchen floor in the house with mold going down to the support structure. This will have to be cleaned up and they will be living in a hotel for a while. Wonders if this could've been adding to her itching. She has some sinus congestion and occasional cough but no unusual rashes. She knows she is allergic to dust mites and mold. Uncertain what she can take cannot raise her blood pressure up she has been weaning on the diuretic.  She sees an alternative dietitian doctor who has her on a number of supplements to help her liver and health and apparently felt to be doing well. No new medications. On up [place] organic diet plus supplements. ROS: See pertinent positives and negatives per HPI.  Past Medical History:  Diagnosis Date  . Anal fissure   . Atrial septal aneurysm / if pfo  echo 6 13  10/23/2011  . Depression   . Fatty liver    "pre fatty liver"  . Fibromyalgia   . GERD (gastroesophageal reflux disease)    hx of, none in a long time  . Headache(784.0)    hx of migraines when younger  . Heart palpitations    hx with normal holter event monitoring  . Hx: UTI (urinary tract infection)   . Hypertension   . Hypothyroidism   . Obesity   . Pneumonia 1972   hx of  . Polyp of colon   . 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 Skin cancer    basal, squamous cell    Family History  Problem Relation Age of Onset  . Hypertension Mother     low borderline  . Osteoporosis Mother   . Hypertension Father   . Liver disease Father     amyloid deceased    . Hyperlipidemia Father   . Juvenile Diabetes Daughter   . ADD / ADHD Child   . Hyperlipidemia      Maternal grandmother  . Melanoma Sister   . Cancer Sister   . Colon cancer Neg Hx   . Stomach cancer Neg Hx     Social History   Social History  . Marital status: Married    Spouse name: N/A  . Number of children: 6  . Years of education: N/A   Occupational History  . 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 Smoking status: Never Smoker  . Smokeless tobacco: Never Used  . Alcohol use Yes     Comment: rare  . Drug use: No  . Sexual activity: Not Asked   Other Topics Concern  . 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 13 left shoulder surgery    Married   Special educ   HH of 4   3 outside dogs, 8 goats, 40 chickens and quail. [place]   Job stresses   Ex husband passes away   Not working after injur at  school  Shoulder neck                    Outpatient Medications Prior to Visit  Medication Sig Dispense Refill  . Ascorbic Acid (VITAMIN C) 1000 MG tablet Take 1,000 mg by mouth daily.    . Cholecalciferol (VITAMIN D3) 10000 UNITS capsule Take 10,000 Units by mouth daily.    Marland Kitchen DHEA 10 MG TABS Take 1 tablet by mouth daily.     . Iodine, Kelp, TABS Take 1 tablet by mouth daily.    Javier Docker Oil 1000 MG CAPS Take 1 capsule by mouth 2 (two) times daily.    . Magnesium 250 MG TABS Take 2 tablets by mouth daily.    . Multiple Vitamin (MULTIVITAMIN WITH MINERALS) TABS tablet Take 1 tablet by mouth daily.    Marland Kitchen NATURE-THROID 16.25 MG TABS Take 1 tablet by mouth daily.     Marland Kitchen NATURE-THROID 97.5 MG TABS Take 1 tablet by mouth daily.     Marland Kitchen OVER THE COUNTER MEDICATION Take 2 tablets by mouth 2 (two) times daily. Reported on 08/20/2015    . potassium chloride SA (K-DUR,KLOR-CON) 20 MEQ tablet Take 1 tablet (20 mEq total) by mouth once. 30 tablet 3  . Probiotic Product  (PROBIOTIC PO) Take 1 capsule by mouth daily.     . Progesterone Micronized (PROGESTERONE PO) Take 150 mg by mouth daily.     Marland Kitchen Ubiquinol 100 MG CAPS Take 1 capsule by mouth daily.    . valsartan (DIOVAN) 320 MG tablet Take 1 tablet (320 mg total) by mouth daily. 90 tablet 2  . VITAMIN K PO Take 400 mcg by mouth daily.    . hydrochlorothiazide (HYDRODIURIL) 25 MG tablet Take 1 tablet (25 mg total) by mouth daily. Or as directed 90 tablet 1  . valsartan-hydrochlorothiazide (DIOVAN-HCT) 320-25 MG tablet TAKE 1 TABLET BY MOUTH DAILY. 90 tablet 2   No facility-administered medications prior to visit.      EXAM:  BP (!) 146/82 (BP Location: Left Arm, Patient Position: Sitting, Cuff Size: Large)   Temp 98.2 F (36.8 C) (Oral)   Wt 202 lb 9.6 oz (91.9 kg)   BMI 32.70 kg/m   Body mass index is 32.7 kg/m.  GENERAL: vitals reviewed and listed above, alert, oriented, appears well hydrated and in no acute distress HEENT: atraumatic, conjunctiva  clear, no obvious abnormalities on inspection of external nose and ears OP : no lesion edema or exudate mild upper congestion. NECK: no obvious masses on inspection palpation  LUNGS: clear to auscultation bilaterally, no wheezes, rales or rhonchi,  CV: HRRR, no clubbing cyanosis or  peripheral edema nl cap refill  MS: moves all extremities without noticeable focal  Abnormality Skin no acute rashes but some excoriations on the forearms somewhat dry skin but no flakiness neck supple without thyromegaly. PSYCH: pleasant and cooperative, no obvious depression or anxiety BP Readings from Last 3 Encounters:  01/02/16 (!) 146/82  10/12/15 120/70  09/24/15 122/82   Wt Readings from Last 3 Encounters:  01/02/16 202 lb 9.6 oz (91.9 kg)  10/12/15 203 lb 9.6 oz (92.4 kg)  09/24/15 206 lb (93.4 kg)   Lab Results  Component Value Date   WBC 9.1 09/24/2015   HGB 12.8 09/24/2015   HCT 37.6 09/24/2015   PLT 226 09/24/2015   GLUCOSE 82 12/27/2015   CHOL  243 (H) 06/13/2015   TRIG 59.0 06/13/2015   HDL 59.40 06/13/2015   LDLDIRECT 165.2 12/27/2012  LDLCALC 172 (H) 06/13/2015   ALT 51 09/24/2015   AST 29 09/24/2015   NA 141 12/27/2015   K 4.2 12/27/2015   CL 106 12/27/2015   CREATININE 0.85 12/27/2015   BUN 13 12/27/2015   CO2 27 12/27/2015   TSH 1.59 06/13/2015   HGBA1C 5.2 06/01/2013     ASSESSMENT AND PLAN:  Discussed the following assessment and plan:  Itching  Mold exposure  Allergy to mold  Labile hypertension Uncertain if her itching is related to her exposure although could have congestion and cough related. However does fit  With the timing of the water damage estimation and mold exposure. so possible related Discussed histamine release can take over-the-counter histamines without decongestants. Hydrate the skin to decrease itching impotence. Follow-up of getting a rash or more severe symptoms. She will follow her blood pressure readings had been good but has been creeping up recently. Total visit 74mins > 50% spent counseling and coordinating care as indicated in above note and in instructions to patient .   Alternative meds  Uses coconut oil ocass for skin hydration  -Patient advised to return or notify health care team  if symptoms worsen ,persist or new concerns arise.  Patient Instructions  Moisturize  The skin can help the itching  Apply after bathing with beads of water on skin.  To seal  In an may help decrease the itching .  Try  Antihistamine  .     Zyrtec or xyzal   Every with add on  CTM or benadryl for comfort.  Can use every day .     Lungs are clear  I dont see need to  Repeat blood work at this time.   Pruritus Pruritus is an itching feeling. There are many different conditions and factors that can make your skin itchy. Dry skin is one of the most common causes of itching. Most cases of itching do not require medical attention. Itchy skin can turn into a rash.  HOME CARE INSTRUCTIONS  Watch your  pruritus for any changes. Take these steps to help with your condition:  Skin Care  Moisturize your skin as needed. A moisturizer that contains petroleum jelly is best for keeping moisture in your skin.  Take or apply medicines only as directed by your health care provider. This may include:  Corticosteroid cream.  Anti-itch lotions.  Oral anti-histamines.  Apply cool compresses to the affected areas.  Try taking a bath with:  Epsom salts. Follow the instructions on the packaging. You can get these at your local pharmacy or grocery store.  Baking soda. Pour a small amount into the bath as directed by your health care provider.  Colloidal oatmeal. Follow the instructions on the packaging. You can get this at your local pharmacy or grocery store.  Try applying baking soda paste to your skin. Stir water into baking soda until it reaches a paste-like consistency.   Do not scratch your skin.  Avoid hot showers or baths, which can make itching worse. A cold shower may help with itching as long as you use a moisturizer after.  Avoid scented soaps, detergents, and perfumes. Use gentle soaps, detergents, perfumes, and other cosmetic products. General Instructions  Avoid wearing tight clothes.  Keep a journal to help track what causes your itch. Write down:  What you eat.  What cosmetic products you use.  What you drink.  What you wear. This includes jewelry.  Use a humidifier. This keeps the air moist, which helps  to prevent dry skin. SEEK MEDICAL CARE IF:  The itching does not go away after several days.  You sweat at night.  You have weight loss.  You are unusually thirsty.  You urinate more than normal.  You are more tired than normal.  You have abdominal pain.  Your skin tingles.  You feel weak.  Your skin or the whites of your eyes look yellow (jaundice).  Your skin feels numb.   This information is not intended to replace advice given to you by your  health care provider. Make sure you discuss any questions you have with your health care provider.   Document Released: 01/08/2011 Document Revised: 09/12/2014 Document Reviewed: 04/24/2014 Elsevier Interactive Patient Education 2016 [place]. Adaja Wander M.D.

## 2016-01-02 NOTE — Patient Instructions (Addendum)
Moisturize  The skin can help the itching  Apply after bathing with beads of water on skin.  To seal  In an may help decrease the itching .  Try  Antihistamine  .     Zyrtec or xyzal   Every with add on  CTM or benadryl for comfort.  Can use every day .     Lungs are clear  I dont see need to  Repeat blood work at this time.   Pruritus Pruritus is an itching feeling. There are many different conditions and factors that can make your skin itchy. Dry skin is one of the most common causes of itching. Most cases of itching do not require medical attention. Itchy skin can turn into a rash.  HOME CARE INSTRUCTIONS  Watch your pruritus for any changes. Take these steps to help with your condition:  Skin Care  Moisturize your skin as needed. A moisturizer that contains petroleum jelly is best for keeping moisture in your skin.  Take or apply medicines only as directed by your health care provider. This may include:  Corticosteroid cream.  Anti-itch lotions.  Oral anti-histamines.  Apply cool compresses to the affected areas.  Try taking a bath with:  Epsom salts. Follow the instructions on the packaging. You can get these at your local pharmacy or grocery store.  Baking soda. Pour a small amount into the bath as directed by your health care provider.  Colloidal oatmeal. Follow the instructions on the packaging. You can get this at your local pharmacy or grocery store.  Try applying baking soda paste to your skin. Stir water into baking soda until it reaches a paste-like consistency.   Do not scratch your skin.  Avoid hot showers or baths, which can make itching worse. A cold shower may help with itching as long as you use a moisturizer after.  Avoid scented soaps, detergents, and perfumes. Use gentle soaps, detergents, perfumes, and other cosmetic products. General Instructions  Avoid wearing tight clothes.  Keep a journal to help track what causes your itch. Write down:  What  you eat.  What cosmetic products you use.  What you drink.  What you wear. This includes jewelry.  Use a humidifier. This keeps the air moist, which helps to prevent dry skin. SEEK MEDICAL CARE IF:  The itching does not go away after several days.  You sweat at night.  You have weight loss.  You are unusually thirsty.  You urinate more than normal.  You are more tired than normal.  You have abdominal pain.  Your skin tingles.  You feel weak.  Your skin or the whites of your eyes look yellow (jaundice).  Your skin feels numb.   This information is not intended to replace advice given to you by your health care provider. Make sure you discuss any questions you have with your health care provider.   Document Released: 01/08/2011 Document Revised: 09/12/2014 Document Reviewed: 04/24/2014 Elsevier Interactive Patient Education [employer].

## 2016-01-08 DIAGNOSIS — M546 Pain in thoracic spine: Secondary | ICD-10-CM | POA: Diagnosis not present

## 2016-01-08 DIAGNOSIS — M542 Cervicalgia: Secondary | ICD-10-CM | POA: Diagnosis not present

## 2016-01-08 DIAGNOSIS — M545 Low back pain: Secondary | ICD-10-CM | POA: Diagnosis not present

## 2016-01-20 ENCOUNTER — Other Ambulatory Visit: Payer: Self-pay | Admitting: Internal Medicine

## 2016-01-22 ENCOUNTER — Telehealth: Payer: Self-pay | Admitting: Family Medicine

## 2016-01-22 DIAGNOSIS — M546 Pain in thoracic spine: Secondary | ICD-10-CM | POA: Diagnosis not present

## 2016-01-22 DIAGNOSIS — M542 Cervicalgia: Secondary | ICD-10-CM | POA: Diagnosis not present

## 2016-01-22 DIAGNOSIS — M545 Low back pain: Secondary | ICD-10-CM | POA: Diagnosis not present

## 2016-01-22 NOTE — Telephone Encounter (Signed)
Pt is now due for BP follow up.  Please help her to make an appointment.  Thanks!!

## 2016-01-22 NOTE — Telephone Encounter (Signed)
Pt has been sch

## 2016-01-22 NOTE — Telephone Encounter (Signed)
Sent to the pharmacy by e-scribe. Last lab normal and instructed to continue medication per last ov note addressing hypokalemia.  Pt now due for bp follow up.  Message sent to scheduling.

## 2016-01-26 NOTE — Progress Notes (Deleted)
No chief complaint on file.   HPI: [name] 60 y.o.  ROS: See pertinent positives and negatives per HPI.  Past Medical History:  Diagnosis Date  . Anal fissure   . Atrial septal aneurysm / if pfo  echo 6 13  10/23/2011  . Depression   . Fatty liver    "pre fatty liver"  . Fibromyalgia   . GERD (gastroesophageal reflux disease)    hx of, none in a long time  . Headache(784.0)    hx of migraines when younger  . Heart palpitations    hx with normal holter event monitoring  . Hx: UTI (urinary tract infection)   . Hypertension   . Hypothyroidism   . Obesity   . Pneumonia 1972   hx of  . Polyp of colon   . 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 Skin cancer    basal, squamous cell    Family History  Problem Relation Age of Onset  . Hypertension Mother     low borderline  . Osteoporosis Mother   . Hypertension Father   . Liver disease Father     amyloid deceased  . Hyperlipidemia Father   . Juvenile Diabetes Daughter   . ADD / ADHD Child   . Hyperlipidemia      Maternal grandmother  . Melanoma Sister   . Cancer Sister   . Colon cancer Neg Hx   . Stomach cancer Neg Hx     Social History   Social History  . Marital status: Married    Spouse name: N/A  . Number of children: 6  . Years of education: N/A   Occupational History  . 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 Smoking status: Never Smoker  . Smokeless tobacco: Never Used  . Alcohol use Yes     Comment: rare  . Drug use: No  . Sexual activity: Not on file   Other Topics Concern  . 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 13 left shoulder surgery    Married   Special educ   HH of 4   3 outside dogs, 8 goats, 40 chickens and quail. [place]   Job stresses   Ex husband passes away   Not working after injur at school  Shoulder neck                    Outpatient  Medications Prior to Visit  Medication Sig Dispense Refill  . Ascorbic Acid (VITAMIN C) 1000 MG tablet Take 1,000 mg by mouth daily.    . Cholecalciferol (VITAMIN D3) 10000 UNITS capsule Take 10,000 Units by mouth daily.    Marland Kitchen DHEA 10 MG TABS Take 1 tablet by mouth daily.     . hydrochlorothiazide (MICROZIDE) 12.5 MG capsule Take 6.25 mg by mouth daily.    . Iodine, Kelp, TABS Take 1 tablet by mouth daily.    Marland Kitchen KLOR-CON M20 20 MEQ tablet TAKE 1 TABLET BY MOUTH EVERY DAY 30 tablet 3  . Krill Oil 1000 MG CAPS Take 1 capsule by mouth 2 (two) times daily.    . Magnesium 250 MG TABS Take 2 tablets by mouth daily.    . Multiple Vitamin (MULTIVITAMIN WITH MINERALS) TABS tablet Take 1 tablet by mouth daily.    Marland Kitchen NATURE-THROID 16.25 MG TABS Take 1 tablet by  mouth daily.     Marland Kitchen NATURE-THROID 97.5 MG TABS Take 1 tablet by mouth daily.     Marland Kitchen OVER THE COUNTER MEDICATION Take 2 tablets by mouth 2 (two) times daily. Reported on 08/20/2015    . Probiotic Product (PROBIOTIC PO) Take 1 capsule by mouth daily.     . Progesterone Micronized (PROGESTERONE PO) Take 150 mg by mouth daily.     Marland Kitchen Ubiquinol 100 MG CAPS Take 1 capsule by mouth daily.    . valsartan (DIOVAN) 320 MG tablet Take 1 tablet (320 mg total) by mouth daily. 90 tablet 2  . VITAMIN K PO Take 400 mcg by mouth daily.     No facility-administered medications prior to visit.      EXAM:  There were no vitals taken for this visit.  There is no height or weight on file to calculate BMI.  GENERAL: vitals reviewed and listed above, alert, oriented, appears well hydrated and in no acute distress HEENT: atraumatic, conjunctiva  clear, no obvious abnormalities on inspection of external nose and ears OP : no lesion edema or exudate  NECK: no obvious masses on inspection palpation  LUNGS: clear to auscultation bilaterally, no wheezes, rales or rhonchi, good air movement CV: HRRR, no clubbing cyanosis or  peripheral edema nl cap refill  MS: moves all  extremities without noticeable focal  abnormality PSYCH: pleasant and cooperative, no obvious depression or anxiety BP Readings from Last 3 Encounters:  01/02/16 (!) 146/82  10/12/15 120/70  09/24/15 122/82   Wt Readings from Last 3 Encounters:  01/02/16 202 lb 9.6 oz (91.9 kg)  10/12/15 203 lb 9.6 oz (92.4 kg)  09/24/15 206 lb (93.4 kg)    ASSESSMENT AND PLAN:  Discussed the following assessment and plan:  No diagnosis found.  -Patient advised to return or notify health care team  if symptoms worsen ,persist or new concerns arise.  There are no Patient Instructions on file for this visit.   Standley Brooking. Nastasha Reising M.D.

## 2016-01-28 ENCOUNTER — Ambulatory Visit: Payer: Self-pay | Admitting: Internal Medicine

## 2016-01-30 DIAGNOSIS — B079 Viral wart, unspecified: Secondary | ICD-10-CM | POA: Diagnosis not present

## 2016-01-30 DIAGNOSIS — D2261 Melanocytic nevi of right upper limb, including shoulder: Secondary | ICD-10-CM | POA: Diagnosis not present

## 2016-01-30 DIAGNOSIS — D485 Neoplasm of uncertain behavior of skin: Secondary | ICD-10-CM | POA: Diagnosis not present

## 2016-01-30 DIAGNOSIS — D225 Melanocytic nevi of trunk: Secondary | ICD-10-CM | POA: Diagnosis not present

## 2016-01-30 DIAGNOSIS — D1800 Hemangioma unspecified site: Secondary | ICD-10-CM | POA: Diagnosis not present

## 2016-01-30 DIAGNOSIS — L821 Other seborrheic keratosis: Secondary | ICD-10-CM | POA: Diagnosis not present
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
## 2016-01-31 ENCOUNTER — Encounter: Payer: Self-pay | Admitting: Internal Medicine

## 2016-01-31 ENCOUNTER — Ambulatory Visit (INDEPENDENT_AMBULATORY_CARE_PROVIDER_SITE_OTHER): Payer: BC Managed Care – PPO | Admitting: Internal Medicine

## 2016-01-31 VITALS — BP 136/70 | Temp 98.3°F | Wt 202.4 lb

## 2016-01-31 DIAGNOSIS — I10 Essential (primary) hypertension: Secondary | ICD-10-CM | POA: Diagnosis not present

## 2016-01-31 DIAGNOSIS — L299 Pruritus, unspecified: Secondary | ICD-10-CM | POA: Diagnosis not present

## 2016-01-31 NOTE — Patient Instructions (Signed)
Continue back on the diuretic with potassium until we are able to get a more controlled diet. Her blood pressure is good today. Check a chemistry panel BMP in about a month no office visit needed. If this is okay will continue for now. Contact us when each want to try to go off again will make a plan.

## 2016-02-06 DIAGNOSIS — R0981 Nasal congestion: Secondary | ICD-10-CM | POA: Diagnosis not present

## 2016-02-06 DIAGNOSIS — H35033 Hypertensive retinopathy, bilateral: Secondary | ICD-10-CM | POA: Diagnosis not present

## 2016-02-06 DIAGNOSIS — J3089 Other allergic rhinitis: Secondary | ICD-10-CM | POA: Diagnosis not present

## 2016-02-06 DIAGNOSIS — R439 Unspecified disturbances of smell and taste: Secondary | ICD-10-CM | POA: Diagnosis not present

## 2016-02-07 DIAGNOSIS — M545 Low back pain: Secondary | ICD-10-CM | POA: Diagnosis not present

## 2016-02-07 DIAGNOSIS — M546 Pain in thoracic spine: Secondary | ICD-10-CM | POA: Diagnosis not present

## 2016-02-07 DIAGNOSIS — M542 Cervicalgia: Secondary | ICD-10-CM | POA: Diagnosis not present

## 2016-02-11 DIAGNOSIS — M546 Pain in thoracic spine: Secondary | ICD-10-CM | POA: Diagnosis not present

## 2016-02-11 DIAGNOSIS — M545 Low back pain: Secondary | ICD-10-CM | POA: Diagnosis not present

## 2016-02-11 DIAGNOSIS — M542 Cervicalgia: Secondary | ICD-10-CM | POA: Diagnosis not present

## 2016-02-18 DIAGNOSIS — M542 Cervicalgia: Secondary | ICD-10-CM | POA: Diagnosis not present

## 2016-02-18 DIAGNOSIS — M546 Pain in thoracic spine: Secondary | ICD-10-CM | POA: Diagnosis not present

## 2016-02-18 DIAGNOSIS — M545 Low back pain: Secondary | ICD-10-CM | POA: Diagnosis not present

## 2016-02-27 DIAGNOSIS — M545 Low back pain: Secondary | ICD-10-CM | POA: Diagnosis not present

## 2016-02-27 DIAGNOSIS — M542 Cervicalgia: Secondary | ICD-10-CM | POA: Diagnosis not present

## 2016-02-27 DIAGNOSIS — M546 Pain in thoracic spine: Secondary | ICD-10-CM | POA: Diagnosis not present

## 2016-03-03 ENCOUNTER — Other Ambulatory Visit (INDEPENDENT_AMBULATORY_CARE_PROVIDER_SITE_OTHER): Payer: BC Managed Care – PPO

## 2016-03-03 DIAGNOSIS — I10 Essential (primary) hypertension: Secondary | ICD-10-CM | POA: Diagnosis not present

## 2016-03-03 LAB — BASIC METABOLIC PANEL
BUN: 16 mg/dL (ref 6–23)
CALCIUM: 10.1 mg/dL (ref 8.4–10.5)
CO2: 31 meq/L (ref 19–32)
CREATININE: 0.69 mg/dL (ref 0.40–1.20)
Chloride: 103 mEq/L (ref 96–112)
GFR: 91.59 mL/min (ref >60.00)
GLUCOSE: 92 mg/dL (ref 70–99)
Potassium: 4.3 mEq/L (ref 3.5–5.1)
Sodium: 142 mEq/L (ref 135–145)

## 2016-03-12 DIAGNOSIS — M542 Cervicalgia: Secondary | ICD-10-CM | POA: Diagnosis not present

## 2016-03-12 DIAGNOSIS — M546 Pain in thoracic spine: Secondary | ICD-10-CM | POA: Diagnosis not present

## 2016-03-12 DIAGNOSIS — M545 Low back pain: Secondary | ICD-10-CM | POA: Diagnosis not present

## 2016-03-26 DIAGNOSIS — M545 Low back pain: Secondary | ICD-10-CM | POA: Diagnosis not present

## 2016-03-26 DIAGNOSIS — M542 Cervicalgia: Secondary | ICD-10-CM | POA: Diagnosis not present

## 2016-03-26 DIAGNOSIS — M546 Pain in thoracic spine: Secondary | ICD-10-CM | POA: Diagnosis not present

## 2016-04-09 DIAGNOSIS — M542 Cervicalgia: Secondary | ICD-10-CM | POA: Diagnosis not present

## 2016-04-09 DIAGNOSIS — M545 Low back pain: Secondary | ICD-10-CM | POA: Diagnosis not present

## 2016-04-09 DIAGNOSIS — M546 Pain in thoracic spine: Secondary | ICD-10-CM | POA: Diagnosis not present

## 2016-04-14 DIAGNOSIS — R5381 Other malaise: Secondary | ICD-10-CM | POA: Diagnosis not present

## 2016-04-14 DIAGNOSIS — E049 Nontoxic goiter, unspecified: Secondary | ICD-10-CM | POA: Diagnosis not present

## 2016-04-14 DIAGNOSIS — N959 Unspecified menopausal and perimenopausal disorder: Secondary | ICD-10-CM | POA: Diagnosis not present

## 2016-04-15 ENCOUNTER — Other Ambulatory Visit: Payer: Self-pay | Admitting: Internal Medicine

## 2016-04-16 NOTE — Telephone Encounter (Signed)
Should pt continue to take medication or wean?

## 2016-04-16 NOTE — Telephone Encounter (Signed)
Would only consider weaning if her blood pressure is fully controlled for at least 4-6 months and then can try half dose. We'll refill her medicine for 6 months in the meantime.

## 2016-04-17 NOTE — Telephone Encounter (Signed)
Noted.  Sent to the pharmacy by e-scribe for 6 months.

## 2016-04-23 DIAGNOSIS — M797 Fibromyalgia: Secondary | ICD-10-CM | POA: Diagnosis not present

## 2016-04-23 DIAGNOSIS — J309 Allergic rhinitis, unspecified: Secondary | ICD-10-CM | POA: Diagnosis not present

## 2016-04-23 DIAGNOSIS — R5383 Other fatigue: Secondary | ICD-10-CM | POA: Diagnosis not present

## 2016-04-23 DIAGNOSIS — E039 Hypothyroidism, unspecified: Secondary | ICD-10-CM | POA: Diagnosis not present

## 2016-04-28 DIAGNOSIS — M546 Pain in thoracic spine: Secondary | ICD-10-CM | POA: Diagnosis not present

## 2016-04-28 DIAGNOSIS — M545 Low back pain: Secondary | ICD-10-CM | POA: Diagnosis not present

## 2016-04-28 DIAGNOSIS — M542 Cervicalgia: Secondary | ICD-10-CM | POA: Diagnosis not present

## 2016-05-14 DIAGNOSIS — M546 Pain in thoracic spine: Secondary | ICD-10-CM | POA: Diagnosis not present

## 2016-05-14 DIAGNOSIS — M542 Cervicalgia: Secondary | ICD-10-CM | POA: Diagnosis not present

## 2016-05-14 DIAGNOSIS — M545 Low back pain: Secondary | ICD-10-CM | POA: Diagnosis not present

## 2016-06-02 DIAGNOSIS — M545 Low back pain: Secondary | ICD-10-CM | POA: Diagnosis not present

## 2016-06-02 DIAGNOSIS — M546 Pain in thoracic spine: Secondary | ICD-10-CM | POA: Diagnosis not present

## 2016-06-02 DIAGNOSIS — M542 Cervicalgia: Secondary | ICD-10-CM | POA: Diagnosis not present

## 2016-06-04 DIAGNOSIS — R5381 Other malaise: Secondary | ICD-10-CM | POA: Diagnosis not present

## 2016-06-04 DIAGNOSIS — E039 Hypothyroidism, unspecified: Secondary | ICD-10-CM | POA: Diagnosis not present

## 2016-06-05 DIAGNOSIS — E042 Nontoxic multinodular goiter: Secondary | ICD-10-CM | POA: Diagnosis not present

## 2016-06-16 DIAGNOSIS — M542 Cervicalgia: Secondary | ICD-10-CM | POA: Diagnosis not present

## 2016-06-16 DIAGNOSIS — M545 Low back pain: Secondary | ICD-10-CM | POA: Diagnosis not present

## 2016-06-16 DIAGNOSIS — M546 Pain in thoracic spine: Secondary | ICD-10-CM | POA: Diagnosis not present

## 2016-07-01 ENCOUNTER — Other Ambulatory Visit: Payer: Self-pay | Admitting: Internal Medicine

## 2016-07-03 ENCOUNTER — Other Ambulatory Visit: Payer: Self-pay | Admitting: Family Medicine

## 2016-07-03 ENCOUNTER — Telehealth: Payer: Self-pay | Admitting: Family Medicine

## 2016-07-03 DIAGNOSIS — Z0000 Encounter for general adult medical examination without abnormal findings: Secondary | ICD-10-CM

## 2016-07-03 NOTE — Telephone Encounter (Signed)
Pt due for cpx and lab work.  I have placed the lab orders.  Please help her to make both appointments.  Thanks!!

## 2016-07-03 NOTE — Telephone Encounter (Signed)
Sent to the pharmacy by e-scribe for 90 days.  Pt is now due for labs and yearly.  Message sent to scheduling.  Did have normal bmp on 03/03/16 and normal.

## 2016-07-03 NOTE — Telephone Encounter (Signed)
Pt has been sch

## 2016-07-07 DIAGNOSIS — M546 Pain in thoracic spine: Secondary | ICD-10-CM | POA: Diagnosis not present

## 2016-07-07 DIAGNOSIS — M545 Low back pain: Secondary | ICD-10-CM | POA: Diagnosis not present

## 2016-07-07 DIAGNOSIS — M542 Cervicalgia: Secondary | ICD-10-CM | POA: Diagnosis not present

## 2016-07-14 ENCOUNTER — Other Ambulatory Visit: Payer: Self-pay | Admitting: Internal Medicine

## 2016-07-23 ENCOUNTER — Other Ambulatory Visit (INDEPENDENT_AMBULATORY_CARE_PROVIDER_SITE_OTHER): Payer: BC Managed Care – PPO

## 2016-07-23 DIAGNOSIS — Z0000 Encounter for general adult medical examination without abnormal findings: Secondary | ICD-10-CM | POA: Diagnosis not present

## 2016-07-23 LAB — HEPATIC FUNCTION PANEL
ALK PHOS: 100 U/L (ref 39–117)
ALT: 31 U/L (ref 0–35)
AST: 20 U/L (ref 0–37)
Albumin: 4.3 g/dL (ref 3.5–5.2)
BILIRUBIN DIRECT: 0.1 mg/dL (ref 0.0–0.3)
TOTAL PROTEIN: 6.7 g/dL (ref 6.0–8.3)
Total Bilirubin: 0.5 mg/dL (ref 0.2–1.2)

## 2016-07-23 LAB — LIPID PANEL
CHOLESTEROL: 244 mg/dL — AB (ref 0–200)
HDL: 62.2 mg/dL (ref >39.00)
LDL Cholesterol: 169 mg/dL — ABNORMAL HIGH (ref 0–99)
NONHDL: 181.46
TRIGLYCERIDES: 60 mg/dL (ref 0.0–149.0)
Total CHOL/HDL Ratio: 4
VLDL: 12 mg/dL (ref 0.0–40.0)

## 2016-07-23 LAB — CBC WITH DIFFERENTIAL/PLATELET
Basophils Absolute: 0 10*3/uL (ref 0.0–0.1)
Basophils Relative: 0.5 % (ref 0.0–3.0)
EOS ABS: 0.2 10*3/uL (ref 0.0–0.7)
EOS PCT: 1.8 % (ref 0.0–5.0)
HCT: 40.6 % (ref 36.0–46.0)
Hemoglobin: 13.8 g/dL (ref 12.0–15.0)
LYMPHS ABS: 3.2 10*3/uL (ref 0.7–4.0)
Lymphocytes Relative: 34.8 % (ref 12.0–46.0)
MCHC: 34 g/dL (ref 30.0–36.0)
MCV: 90.3 fl (ref 78.0–100.0)
MONO ABS: 0.5 10*3/uL (ref 0.1–1.0)
Monocytes Relative: 5.9 % (ref 3.0–12.0)
NEUTROS PCT: 57 % (ref 43.0–77.0)
Neutro Abs: 5.2 10*3/uL (ref 1.4–7.7)
PLATELETS: 228 10*3/uL (ref 150.0–400.0)
RBC: 4.5 Mil/uL (ref 3.87–5.11)
RDW: 13.4 % (ref 11.5–15.5)
WBC: 9.1 10*3/uL (ref 4.0–10.5)

## 2016-07-23 LAB — BASIC METABOLIC PANEL
BUN: 22 mg/dL (ref 6–23)
CO2: 28 mEq/L (ref 19–32)
CREATININE: 0.8 mg/dL (ref 0.40–1.20)
Calcium: 9.8 mg/dL (ref 8.4–10.5)
Chloride: 104 mEq/L (ref 96–112)
GFR: 77.12 mL/min (ref >60.00)
GLUCOSE: 84 mg/dL (ref 70–99)
POTASSIUM: 3.9 meq/L (ref 3.5–5.1)
Sodium: 142 mEq/L (ref 135–145)

## 2016-07-23 LAB — TSH: TSH: 0.85 u[IU]/mL (ref 0.35–4.50)

## 2016-07-24 DIAGNOSIS — M542 Cervicalgia: Secondary | ICD-10-CM | POA: Diagnosis not present

## 2016-07-24 DIAGNOSIS — M546 Pain in thoracic spine: Secondary | ICD-10-CM | POA: Diagnosis not present

## 2016-07-24 DIAGNOSIS — M545 Low back pain: Secondary | ICD-10-CM | POA: Diagnosis not present

## 2016-07-29 NOTE — Progress Notes (Signed)
Chief Complaint  Patient presents with  . Annual Exam    HPI: Patient  [name]  62 y.o. comes in today for Preventive Health Care visit  And med management  HT ;ast check about 128 taking med  Leg cramps at times taking potass qod cause forgets  At times    [facility] otherwise  Shoulder  Left  At maximal improvement  On disability but no job available for her in the county.   eating pale  Some coconut oil .  utd pap mammo and dexa per dr Radene Knee   Health Maintenance  Topic Date Due  . INFLUENZA VACCINE  10/09/2016 (Originally 12/11/2015)  . HIV Screening  07/30/2017 (Originally 01/17/1971)  . PAP SMEAR  12/12/2016  . COLONOSCOPY  12/13/2016  . MAMMOGRAM  12/10/2017  . TETANUS/TDAP  03/28/2024  . 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 GI /GU: No adominal pain, vomiting, change in bowel habits. No blood in the stool. No significant GU symptoms. SKIN/HEME: ,no acute skin rashes suspicious lesions or bleeding. No lymphadenopathy, nodules, masses.  NEURO/ PSYCH:  No neurologic signs such as weakness numbness. No depression anxiety. IMM/ Allergy: No unusual infections.  Allergy .   REST of 12 system review negative except as per HPI   Past Medical History:  Diagnosis Date  . Anal fissure   . Atrial septal aneurysm / if pfo  echo 6 13  10/23/2011  . Depression   . Fatty liver    "pre fatty liver"  . Fibromyalgia   . GERD  (gastroesophageal reflux disease)    hx of, none in a long time  . Headache(784.0)    hx of migraines when younger  . Heart palpitations    hx with normal holter event monitoring  . Hx: UTI (urinary tract infection)   . Hypertension   . Hypothyroidism   . Obesity   . Pneumonia 1972   hx of  . Polyp of colon   . 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 Skin cancer    basal, squamous cell    Past Surgical History:  Procedure Laterality Date  . DILATION AND CURETTAGE OF UTERUS  1978  . [place]  . KNEE ARTHROSCOPY     both in past  . SHOULDER OPEN ROTATOR CUFF REPAIR Left 07/07/2013   Procedure: LEFT SHOULDER MINI OPEN SUBACROMIAL DECOMPRESSION ROTATOR CUFF REPAIR AND POSSIBLE PATCH GRAFT ;  Surgeon: Johnn Hai, MD;  Location: [facility];  Service: Orthopedics;  Laterality: Left;  with interscaline block  . SKIN CANCER EXCISION Bilateral    arm, legs, and chest  . TONSILLECTOMY    . TONSILLECTOMY  1971    Family History  Problem Relation Age of Onset  . Hypertension Mother     low borderline  . Osteoporosis Mother   . Hypertension Father   . Liver disease Father     amyloid  deceased  . Hyperlipidemia Father   . Melanoma Sister   . Cancer Sister   . Juvenile Diabetes Daughter   . ADD / ADHD Child   . Hyperlipidemia      Maternal grandmother  . Colon cancer Neg Hx   . Stomach cancer Neg Hx     Social History   Social History  . Marital status: Married    Spouse name: N/A  . Number of children: 6  . Years of education: N/A   Occupational History  . 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 Smoking status: Never Smoker  . Smokeless tobacco: Never Used  . Alcohol use Yes     Comment: rare  . Drug use: No  . Sexual activity: Not Asked   Other Topics Concern  . 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 15 left shoulder surgery    Married   Special  educ   HH of 4   3 outside dogs, 8 goats, 40 chickens and quail. [place]   Job stresses   Ex husband passes away   Not working after injur at school  Shoulder neck                    Outpatient Medications Prior to Visit  Medication Sig Dispense Refill  . Ascorbic Acid (VITAMIN C) 1000 MG tablet Take 1,000 mg by mouth daily.    . Cholecalciferol (VITAMIN D3) 10000 UNITS capsule Take 10,000 Units by mouth daily.    Marland Kitchen DHEA 10 MG TABS Take 1 tablet by mouth daily.     . hydrochlorothiazide (HYDRODIURIL) 25 MG tablet TAKE 1 TABLET BY MOUTH EVERY DAY OR AS DIRECTED 90 tablet 1  . Iodine, Kelp, TABS Take 1 tablet by mouth daily.    Marland Kitchen KLOR-CON M20 20 MEQ tablet TAKE 1 TABLET BY MOUTH EVERY DAY 30 tablet 3  . Krill Oil 1000 MG CAPS Take 1 capsule by mouth 2 (two) times daily.    . Magnesium 250 MG TABS Take 2 tablets by mouth daily.    . Multiple Vitamin (MULTIVITAMIN WITH MINERALS) TABS tablet Take 1 tablet by mouth daily.    Marland Kitchen NATURE-THROID 16.25 MG TABS Take 1 tablet by mouth daily.     Marland Kitchen NATURE-THROID 97.5 MG TABS Take 1 tablet by mouth daily.     . Probiotic Product (PROBIOTIC PO) Take 1 capsule by mouth daily.     . Progesterone Micronized (PROGESTERONE PO) Take 150 mg by mouth daily.     Marland Kitchen Ubiquinol 100 MG CAPS Take 1 capsule by mouth daily.    . valsartan (DIOVAN) 320 MG tablet TAKE 1 TABLET BY MOUTH EVERY DAY 90 tablet 2  . VITAMIN K PO Take 400 mcg by mouth daily.    . hydrochlorothiazide (MICROZIDE) 12.5 MG capsule Take 12.5 mg by mouth daily.     Marland Kitchen OVER THE COUNTER MEDICATION Take 2 tablets by mouth 2 (two) times daily. Reported on 08/20/2015    . KLOR-CON M20 20 MEQ tablet TAKE 1 TABLET BY MOUTH EVERY DAY 90 tablet 0   No facility-administered medications prior to visit.      EXAM:  BP 100/68 (BP Location: Right Arm, Patient Position: Sitting, Cuff Size: Normal)   Pulse (!) 105   Ht 5' 5.5" (1.664 m)   Wt 201 lb (91.2 kg)   BMI  32.94 kg/m   Body  mass index is 32.94 kg/m. Wt Readings from Last 3 Encounters:  07/30/16 201 lb (91.2 kg)  01/31/16 202 lb 6.4 oz (91.8 kg)  01/02/16 202 lb 9.6 oz (91.9 kg)    Physical Exam: Vital signs reviewed ENI:DPOE is a well-developed well-nourished alert cooperative    who appearsr stated age in no acute distress.  HEENT: normocephalic atraumatic , Eyes: PERRL EOM's full, conjunctiva clear, Nares: paten,t no deformity discharge or tenderness., Ears: no deformity EAC's clear TMs with normal landmarks. Mouth: clear OP, no lesions, edema.  Moist mucous membranes. Dentition in adequate repair. NECK: supple without masses, thyromegaly or bruits. CHEST/PULM:  Clear to auscultation and percussion breath sounds equal no wheeze , rales or rhonchi. No chest wall deformities or tenderness. Breast: normal by inspection . No dimpling, discharge, masses, tenderness or discharge . CV: PMI is nondisplaced, S1 S2 no gallops, murmurs, rubs. Peripheral pulses are full without delay.No JVD .  ABDOMEN: Bowel sounds normal nontender  No guard or rebound, no hepato splenomegal no CVA tenderness.  No hernia. Extremtities:  No clubbing cyanosis or edema, no acute joint swelling or redness no focal atrophy NEURO:  Oriented x3, cranial nerves 3-12 appear to be intact, no obvious focal weakness,gait within normal limits no abnormal reflexes or asymmetrical SKIN: No acute rashes normal turgor, color, no bruising or petechiae. PSYCH: Oriented, good eye contact, no obvious depression anxiety, cognition and judgment appear normal. LN: no cervical axillary inguinal adenopathy  Lab Results  Component Value Date   WBC 9.1 07/23/2016   HGB 13.8 07/23/2016   HCT 40.6 07/23/2016   PLT 228.0 07/23/2016   GLUCOSE 84 07/23/2016   CHOL 244 (H) 07/23/2016   TRIG 60.0 07/23/2016   HDL 62.20 07/23/2016   LDLDIRECT 165.2 12/27/2012   LDLCALC 169 (H) 07/23/2016   ALT 31 07/23/2016   AST 20 07/23/2016   NA 142 07/23/2016   K 3.9  07/23/2016   CL 104 07/23/2016   CREATININE 0.80 07/23/2016   BUN 22 07/23/2016   CO2 28 07/23/2016   TSH 0.85 07/23/2016   HGBA1C 5.2 06/01/2013    BP Readings from Last 3 Encounters:  07/30/16 100/68  01/31/16 136/70  01/02/16 (!) 146/82   Wt Readings from Last 3 Encounters:  07/30/16 201 lb (91.2 kg)  01/31/16 202 lb 6.4 oz (91.8 kg)  01/02/16 202 lb 9.6 oz (91.9 kg)     Lab results reviewed with patient   ASSESSMENT AND PLAN:  Discussed the following assessment and plan:  Visit for preventive health examination  Essential hypertension - controlled   Hyperlipidemia, unspecified hyperlipidemia type  Medication management Foot cramps  Poss med potassium  Over all doing well  Declines medication for lipid control reveiwed lsi     Avoid plam oi,ls more vegan /mediterranean to try  Patient Care Team: Burnis Medin, MD as PCP - General Kathee Delton, MD (Pulmonary Disease) Arvella Nigh, MD as Consulting Physician (Obstetrics and Gynecology) Dr Peggye Form, MD as Consulting Physician (Orthopedic Surgery) Ladene Artist, MD as Consulting Physician (Gastroenterology) Patient Instructions  Continue lifestyle intervention healthy eating and exercise .  Avoid palm oils etc  More veggies      Mediterranean type eating   Same meds   .preventive  And med check in a year  Try the potassium every day   And magnesium ok with caution   Health Maintenance, Female Adopting a healthy lifestyle and getting preventive care  can go a long way to promote health and wellness. Talk with your health care provider about what schedule of regular examinations is right for you. This is a good chance for you to check in with your provider about disease prevention and staying healthy. In between checkups, there are plenty of things you can do on your own. Experts have done a lot of research about which lifestyle changes and preventive measures are most likely to keep you healthy. Ask  your health care provider for more information. Weight and diet Eat a healthy diet  Be sure to include plenty of vegetables, fruits, low-fat dairy products, and lean protein.  Do not eat a lot of foods high in solid fats, added sugars, or salt.  Get regular exercise. This is one of the most important things you can do for your health.  Most adults should exercise for at least 150 minutes each week. The exercise should increase your heart rate and make you sweat (moderate-intensity exercise).  Most adults should also do strengthening exercises at least twice a week. This is in addition to the moderate-intensity exercise. Maintain a healthy weight  Body mass index (BMI) is a measurement that can be used to identify possible weight problems. It estimates body fat based on height and weight. Your health care provider can help determine your BMI and help you achieve or maintain a healthy weight.  For females 79 years of age and older:  A BMI below 18.5 is considered underweight.  A BMI of 18.5 to 24.9 is normal.  A BMI of 25 to 29.9 is considered overweight.  A BMI of 30 and above is considered obese. Watch levels of cholesterol and blood lipids  You should start having your blood tested for lipids and cholesterol at 63 years of age, then have this test every 5 years.  You may need to have your cholesterol levels checked more often if:  Your lipid or cholesterol levels are high.  You are older than 63 years of age.  You are at high risk for heart disease. Cancer screening Lung Cancer  Lung cancer screening is recommended for adults 68-54 years old who are at high risk for lung cancer because of a history of smoking.  A yearly low-dose CT scan of the lungs is recommended for people who:  Currently smoke.  Have quit within the past 15 years.  Have at least a 30-pack-year history of smoking. A pack year is smoking an average of one pack of cigarettes a day for 1  year.  Yearly screening should continue until it has been 15 years since you quit.  Yearly screening should stop if you develop a health problem that would prevent you from having lung cancer treatment. Breast Cancer  Practice breast self-awareness. This means understanding how your breasts normally appear and feel.  It also means doing regular breast self-exams. Let your health care provider know about any changes, no matter how small.  If you are in your 20s or 30s, you should have a clinical breast exam (CBE) by a health care provider every 1-3 years as part of a regular health exam.  If you are 24 or older, have a CBE every year. Also consider having a breast X-ray (mammogram) every year.  If you have a family history of breast cancer, talk to your health care provider about genetic screening.  If you are at high risk for breast cancer, talk to your health care provider about having an MRI and  a mammogram every year.  Breast cancer gene (BRCA) assessment is recommended for women who have family members with BRCA-related cancers. BRCA-related cancers include:  Breast.  Ovarian.  Tubal.  Peritoneal cancers.  Results of the assessment will determine the need for genetic counseling and BRCA1 and BRCA2 testing. Cervical Cancer  Your health care provider may recommend that you be screened regularly for cancer of the pelvic organs (ovaries, uterus, and vagina). This screening involves a pelvic examination, including checking for microscopic changes to the surface of your cervix (Pap test). You may be encouraged to have this screening done every 3 years, beginning at age 37.  For women ages 61-65, health care providers may recommend pelvic exams and Pap testing every 3 years, or they may recommend the Pap and pelvic exam, combined with testing for human papilloma virus (HPV), every 5 years. Some types of HPV increase your risk of cervical cancer. Testing for HPV may also be done on women  of any age with unclear Pap test results.  Other health care providers may not recommend any screening for nonpregnant women who are considered low risk for pelvic cancer and who do not have symptoms. Ask your health care provider if a screening pelvic exam is right for you.  If you have had past treatment for cervical cancer or a condition that could lead to cancer, you need Pap tests and screening for cancer for at least 20 years after your treatment. If Pap tests have been discontinued, your risk factors (such as having a new sexual partner) need to be reassessed to determine if screening should resume. Some women have medical problems that increase the chance of getting cervical cancer. In these cases, your health care provider may recommend more frequent screening and Pap tests. Colorectal Cancer  This type of cancer can be detected and often prevented.  Routine colorectal cancer screening usually begins at 63 years of age and continues through 63 years of age.  Your health care provider may recommend screening at an earlier age if you have risk factors for colon cancer.  Your health care provider may also recommend using home test kits to check for hidden blood in the stool.  A small camera at the end of a tube can be used to examine your colon directly (sigmoidoscopy or colonoscopy). This is done to check for the earliest forms of colorectal cancer.  Routine screening usually begins at age 46.  Direct examination of the colon should be repeated every 5-10 years through 63 years of age. However, you may need to be screened more often if early forms of precancerous polyps or small growths are found. Skin Cancer  Check your skin from head to toe regularly.  Tell your health care provider about any new moles or changes in moles, especially if there is a change in a mole's shape or color.  Also tell your health care provider if you have a mole that is larger than the size of a pencil  eraser.  Always use sunscreen. Apply sunscreen liberally and repeatedly throughout the day.  Protect yourself by wearing long sleeves, pants, a wide-brimmed hat, and sunglasses whenever you are outside. Heart disease, diabetes, and high blood pressure  High blood pressure causes heart disease and increases the risk of stroke. High blood pressure is more likely to develop in:  People who have blood pressure in the high end of the normal range (130-139/85-89 mm Hg).  People who are overweight or obese.  People who are  African American.  If you are 26-41 years of age, have your blood pressure checked every 3-5 years. If you are 65 years of age or older, have your blood pressure checked every year. You should have your blood pressure measured twice-once when you are at a hospital or clinic, and once when you are not at a hospital or clinic. Record the average of the two measurements. To check your blood pressure when you are not at a hospital or clinic, you can use:  An automated blood pressure machine at a pharmacy.  A home blood pressure monitor.  If you are between 47 years and 51 years old, ask your health care provider if you should take aspirin to prevent strokes.  Have regular diabetes screenings. This involves taking a blood sample to check your fasting blood sugar level.  If you are at a normal weight and have a low risk for diabetes, have this test once every three years after 64 years of age.  If you are overweight and have a high risk for diabetes, consider being tested at a younger age or more often. Preventing infection Hepatitis B  If you have a higher risk for hepatitis B, you should be screened for this virus. You are considered at high risk for hepatitis B if:  You were born in a country where hepatitis B is common. Ask your health care provider which countries are considered high risk.  Your parents were born in a high-risk country, and you have not been immunized  against hepatitis B (hepatitis B vaccine).  You have HIV or AIDS.  You use needles to inject street drugs.  You live with someone who has hepatitis B.  You have had sex with someone who has hepatitis B.  You get hemodialysis treatment.  You take certain medicines for conditions, including cancer, organ transplantation, and autoimmune conditions. Hepatitis C  Blood testing is recommended for:  Everyone born from [birth year] through 1967.  Anyone with known risk factors for hepatitis C. Sexually transmitted infections (STIs)  You should be screened for sexually transmitted infections (STIs) including gonorrhea and chlamydia if:  You are sexually active and are younger than 63 years of age.  You are older than 63 years of age and your health care provider tells you that you are at risk for this type of infection.  Your sexual activity has changed since you were last screened and you are at an increased risk for chlamydia or gonorrhea. Ask your health care provider if you are at risk.  If you do not have HIV, but are at risk, it may be recommended that you take a prescription medicine daily to prevent HIV infection. This is called pre-exposure prophylaxis (PrEP). You are considered at risk if:  You are sexually active and do not regularly use condoms or know the HIV status of your partner(s).  You take drugs by injection.  You are sexually active with a partner who has HIV. Talk with your health care provider about whether you are at high risk of being infected with HIV. If you choose to begin PrEP, you should first be tested for HIV. You should then be tested every 3 months for as long as you are taking PrEP. Pregnancy  If you are premenopausal and you may become pregnant, ask your health care provider about preconception counseling.  If you may become pregnant, take 400 to 800 micrograms (mcg) of folic acid every day.  If you want to prevent pregnancy, talk to  your health care  provider about birth control (contraception). Osteoporosis and menopause  Osteoporosis is a disease in which the bones lose minerals and strength with aging. This can result in serious bone fractures. Your risk for osteoporosis can be identified using a bone density scan.  If you are 76 years of age or older, or if you are at risk for osteoporosis and fractures, ask your health care provider if you should be screened.  Ask your health care provider whether you should take a calcium or vitamin D supplement to lower your risk for osteoporosis.  Menopause may have certain physical symptoms and risks.  Hormone replacement therapy may reduce some of these symptoms and risks. Talk to your health care provider about whether hormone replacement therapy is right for you. Follow these instructions at home:  Schedule regular health, dental, and eye exams.  Stay current with your immunizations.  Do not use any tobacco products including cigarettes, chewing tobacco, or electronic cigarettes.  If you are pregnant, do not drink alcohol.  If you are breastfeeding, limit how much and how often you drink alcohol.  Limit alcohol intake to no more than 1 drink per day for nonpregnant women. One drink equals 12 ounces of beer, 5 ounces of wine, or 1 ounces of hard liquor.  Do not use street drugs.  Do not share needles.  Ask your health care provider for help if you need support or information about quitting drugs.  Tell your health care provider if you often feel depressed.  Tell your health care provider if you have ever been abused or do not feel safe at home. This information is not intended to replace advice given to you by your health care provider. Make sure you discuss any questions you have with your health care provider. Document Released: 11/11/2010 Document Revised: 10/04/2015 Document Reviewed: 01/30/2015 Elsevier Interactive Patient Education  2017 [employer].  Maggie Dworkin M.D.

## 2016-07-30 ENCOUNTER — Ambulatory Visit (INDEPENDENT_AMBULATORY_CARE_PROVIDER_SITE_OTHER): Payer: BC Managed Care – PPO | Admitting: Internal Medicine

## 2016-07-30 ENCOUNTER — Encounter: Payer: Self-pay | Admitting: Internal Medicine

## 2016-07-30 VITALS — BP 100/68 | HR 105 | Ht 65.5 in | Wt 201.0 lb

## 2016-07-30 DIAGNOSIS — E785 Hyperlipidemia, unspecified: Secondary | ICD-10-CM | POA: Diagnosis not present

## 2016-07-30 DIAGNOSIS — Z0000 Encounter for general adult medical examination without abnormal findings: Secondary | ICD-10-CM

## 2016-07-30 DIAGNOSIS — Z79899 Other long term (current) drug therapy: Secondary | ICD-10-CM

## 2016-07-30 DIAGNOSIS — I10 Essential (primary) hypertension: Secondary | ICD-10-CM

## 2016-07-30 NOTE — Patient Instructions (Addendum)
Continue lifestyle intervention healthy eating and exercise .  Avoid palm oils etc  More veggies      Mediterranean type eating   Same meds   .preventive  And med check in a year  Try the potassium every day   And magnesium ok with caution   Health Maintenance, Female Adopting a healthy lifestyle and getting preventive care can go a long way to promote health and wellness. Talk with your health care provider about what schedule of regular examinations is right for you. This is a good chance for you to check in with your provider about disease prevention and staying healthy. In between checkups, there are plenty of things you can do on your own. Experts have done a lot of research about which lifestyle changes and preventive measures are most likely to keep you healthy. Ask your health care provider for more information. Weight and diet Eat a healthy diet  Be sure to include plenty of vegetables, fruits, low-fat dairy products, and lean protein.  Do not eat a lot of foods high in solid fats, added sugars, or salt.  Get regular exercise. This is one of the most important things you can do for your health.  Most adults should exercise for at least 150 minutes each week. The exercise should increase your heart rate and make you sweat (moderate-intensity exercise).  Most adults should also do strengthening exercises at least twice a week. This is in addition to the moderate-intensity exercise. Maintain a healthy weight  Body mass index (BMI) is a measurement that can be used to identify possible weight problems. It estimates body fat based on height and weight. Your health care provider can help determine your BMI and help you achieve or maintain a healthy weight.  For females 60 years of age and older:  A BMI below 18.5 is considered underweight.  A BMI of 18.5 to 24.9 is normal.  A BMI of 25 to 29.9 is considered overweight.  A BMI of 30 and above is considered obese. Watch levels  of cholesterol and blood lipids  You should start having your blood tested for lipids and cholesterol at 63 years of age, then have this test every 5 years.  You may need to have your cholesterol levels checked more often if:  Your lipid or cholesterol levels are high.  You are older than 63 years of age.  You are at high risk for heart disease. Cancer screening Lung Cancer  Lung cancer screening is recommended for adults 56-75 years old who are at high risk for lung cancer because of a history of smoking.  A yearly low-dose CT scan of the lungs is recommended for people who:  Currently smoke.  Have quit within the past 15 years.  Have at least a 30-pack-year history of smoking. A pack year is smoking an average of one pack of cigarettes a day for 1 year.  Yearly screening should continue until it has been 15 years since you quit.  Yearly screening should stop if you develop a health problem that would prevent you from having lung cancer treatment. Breast Cancer  Practice breast self-awareness. This means understanding how your breasts normally appear and feel.  It also means doing regular breast self-exams. Let your health care provider know about any changes, no matter how small.  If you are in your 20s or 30s, you should have a clinical breast exam (CBE) by a health care provider every 1-3 years as part of a regular  health exam.  If you are 40 or older, have a CBE every year. Also consider having a breast X-ray (mammogram) every year.  If you have a family history of breast cancer, talk to your health care provider about genetic screening.  If you are at high risk for breast cancer, talk to your health care provider about having an MRI and a mammogram every year.  Breast cancer gene (BRCA) assessment is recommended for women who have family members with BRCA-related cancers. BRCA-related cancers include:  Breast.  Ovarian.  Tubal.  Peritoneal cancers.  Results of  the assessment will determine the need for genetic counseling and BRCA1 and BRCA2 testing. Cervical Cancer  Your health care provider may recommend that you be screened regularly for cancer of the pelvic organs (ovaries, uterus, and vagina). This screening involves a pelvic examination, including checking for microscopic changes to the surface of your cervix (Pap test). You may be encouraged to have this screening done every 3 years, beginning at age 38.  For women ages 70-65, health care providers may recommend pelvic exams and Pap testing every 3 years, or they may recommend the Pap and pelvic exam, combined with testing for human papilloma virus (HPV), every 5 years. Some types of HPV increase your risk of cervical cancer. Testing for HPV may also be done on women of any age with unclear Pap test results.  Other health care providers may not recommend any screening for nonpregnant women who are considered low risk for pelvic cancer and who do not have symptoms. Ask your health care provider if a screening pelvic exam is right for you.  If you have had past treatment for cervical cancer or a condition that could lead to cancer, you need Pap tests and screening for cancer for at least 20 years after your treatment. If Pap tests have been discontinued, your risk factors (such as having a new sexual partner) need to be reassessed to determine if screening should resume. Some women have medical problems that increase the chance of getting cervical cancer. In these cases, your health care provider may recommend more frequent screening and Pap tests. Colorectal Cancer  This type of cancer can be detected and often prevented.  Routine colorectal cancer screening usually begins at 63 years of age and continues through 62 years of age.  Your health care provider may recommend screening at an earlier age if you have risk factors for colon cancer.  Your health care provider may also recommend using home test  kits to check for hidden blood in the stool.  A small camera at the end of a tube can be used to examine your colon directly (sigmoidoscopy or colonoscopy). This is done to check for the earliest forms of colorectal cancer.  Routine screening usually begins at age 75.  Direct examination of the colon should be repeated every 5-10 years through 63 years of age. However, you may need to be screened more often if early forms of precancerous polyps or small growths are found. Skin Cancer  Check your skin from head to toe regularly.  Tell your health care provider about any new moles or changes in moles, especially if there is a change in a mole's shape or color.  Also tell your health care provider if you have a mole that is larger than the size of a pencil eraser.  Always use sunscreen. Apply sunscreen liberally and repeatedly throughout the day.  Protect yourself by wearing long sleeves, pants, a wide-brimmed hat,  and sunglasses whenever you are outside. Heart disease, diabetes, and high blood pressure  High blood pressure causes heart disease and increases the risk of stroke. High blood pressure is more likely to develop in:  People who have blood pressure in the high end of the normal range (130-139/85-89 mm Hg).  People who are overweight or obese.  People who are African American.  If you are 76-70 years of age, have your blood pressure checked every 3-5 years. If you are 33 years of age or older, have your blood pressure checked every year. You should have your blood pressure measured twice-once when you are at a hospital or clinic, and once when you are not at a hospital or clinic. Record the average of the two measurements. To check your blood pressure when you are not at a hospital or clinic, you can use:  An automated blood pressure machine at a pharmacy.  A home blood pressure monitor.  If you are between 59 years and 10 years old, ask your health care provider if you should  take aspirin to prevent strokes.  Have regular diabetes screenings. This involves taking a blood sample to check your fasting blood sugar level.  If you are at a normal weight and have a low risk for diabetes, have this test once every three years after 63 years of age.  If you are overweight and have a high risk for diabetes, consider being tested at a younger age or more often. Preventing infection Hepatitis B  If you have a higher risk for hepatitis B, you should be screened for this virus. You are considered at high risk for hepatitis B if:  You were born in a country where hepatitis B is common. Ask your health care provider which countries are considered high risk.  Your parents were born in a high-risk country, and you have not been immunized against hepatitis B (hepatitis B vaccine).  You have HIV or AIDS.  You use needles to inject street drugs.  You live with someone who has hepatitis B.  You have had sex with someone who has hepatitis B.  You get hemodialysis treatment.  You take certain medicines for conditions, including cancer, organ transplantation, and autoimmune conditions. Hepatitis C  Blood testing is recommended for:  Everyone born from [birth year] through 1965.  Anyone with known risk factors for hepatitis C. Sexually transmitted infections (STIs)  You should be screened for sexually transmitted infections (STIs) including gonorrhea and chlamydia if:  You are sexually active and are younger than 63 years of age.  You are older than 63 years of age and your health care provider tells you that you are at risk for this type of infection.  Your sexual activity has changed since you were last screened and you are at an increased risk for chlamydia or gonorrhea. Ask your health care provider if you are at risk.  If you do not have HIV, but are at risk, it may be recommended that you take a prescription medicine daily to prevent HIV infection. This is called  pre-exposure prophylaxis (PrEP). You are considered at risk if:  You are sexually active and do not regularly use condoms or know the HIV status of your partner(s).  You take drugs by injection.  You are sexually active with a partner who has HIV. Talk with your health care provider about whether you are at high risk of being infected with HIV. If you choose to begin PrEP, you should first be  tested for HIV. You should then be tested every 3 months for as long as you are taking PrEP. Pregnancy  If you are premenopausal and you may become pregnant, ask your health care provider about preconception counseling.  If you may become pregnant, take 400 to 800 micrograms (mcg) of folic acid every day.  If you want to prevent pregnancy, talk to your health care provider about birth control (contraception). Osteoporosis and menopause  Osteoporosis is a disease in which the bones lose minerals and strength with aging. This can result in serious bone fractures. Your risk for osteoporosis can be identified using a bone density scan.  If you are 65 years of age or older, or if you are at risk for osteoporosis and fractures, ask your health care provider if you should be screened.  Ask your health care provider whether you should take a calcium or vitamin D supplement to lower your risk for osteoporosis.  Menopause may have certain physical symptoms and risks.  Hormone replacement therapy may reduce some of these symptoms and risks. Talk to your health care provider about whether hormone replacement therapy is right for you. Follow these instructions at home:  Schedule regular health, dental, and eye exams.  Stay current with your immunizations.  Do not use any tobacco products including cigarettes, chewing tobacco, or electronic cigarettes.  If you are pregnant, do not drink alcohol.  If you are breastfeeding, limit how much and how often you drink alcohol.  Limit alcohol intake to no more  than 1 drink per day for nonpregnant women. One drink equals 12 ounces of beer, 5 ounces of wine, or 1 ounces of hard liquor.  Do not use street drugs.  Do not share needles.  Ask your health care provider for help if you need support or information about quitting drugs.  Tell your health care provider if you often feel depressed.  Tell your health care provider if you have ever been abused or do not feel safe at home. This information is not intended to replace advice given to you by your health care provider. Make sure you discuss any questions you have with your health care provider. Document Released: 11/11/2010 Document Revised: 10/04/2015 Document Reviewed: 01/30/2015 Elsevier Interactive Patient Education  2017 [employer].

## 2016-08-11 DIAGNOSIS — M545 Low back pain: Secondary | ICD-10-CM | POA: Diagnosis not present

## 2016-08-11 DIAGNOSIS — M542 Cervicalgia: Secondary | ICD-10-CM | POA: Diagnosis not present

## 2016-08-11 DIAGNOSIS — M546 Pain in thoracic spine: Secondary | ICD-10-CM | POA: Diagnosis not present

## 2016-08-15 DIAGNOSIS — I10 Essential (primary) hypertension: Secondary | ICD-10-CM | POA: Diagnosis not present

## 2016-08-15 DIAGNOSIS — N951 Menopausal and female climacteric states: Secondary | ICD-10-CM | POA: Diagnosis not present

## 2016-08-15 DIAGNOSIS — E782 Mixed hyperlipidemia: Secondary | ICD-10-CM | POA: Diagnosis not present

## 2016-08-15 DIAGNOSIS — E039 Hypothyroidism, unspecified: Secondary | ICD-10-CM | POA: Diagnosis not present

## 2016-08-22 DIAGNOSIS — D2272 Melanocytic nevi of left lower limb, including hip: Secondary | ICD-10-CM | POA: Diagnosis not present

## 2016-08-22 DIAGNOSIS — D225 Melanocytic nevi of trunk: Secondary | ICD-10-CM | POA: Diagnosis not present

## 2016-08-22 DIAGNOSIS — D1800 Hemangioma unspecified site: Secondary | ICD-10-CM | POA: Diagnosis not present

## 2016-08-22 DIAGNOSIS — D2261 Melanocytic nevi of right upper limb, including shoulder: Secondary | ICD-10-CM | POA: Diagnosis not present

## 2016-08-27 DIAGNOSIS — M797 Fibromyalgia: Secondary | ICD-10-CM | POA: Diagnosis not present

## 2016-08-27 DIAGNOSIS — R5383 Other fatigue: Secondary | ICD-10-CM | POA: Diagnosis not present

## 2016-08-27 DIAGNOSIS — E039 Hypothyroidism, unspecified: Secondary | ICD-10-CM | POA: Diagnosis not present

## 2016-08-27 DIAGNOSIS — J309 Allergic rhinitis, unspecified: Secondary | ICD-10-CM | POA: Diagnosis not present

## 2016-10-16 ENCOUNTER — Other Ambulatory Visit: Payer: Self-pay | Admitting: Internal Medicine

## 2016-10-28 ENCOUNTER — Encounter: Payer: Self-pay | Admitting: Gastroenterology

## 2016-11-14 ENCOUNTER — Encounter: Payer: Self-pay | Admitting: Gastroenterology

## 2016-11-24 ENCOUNTER — Ambulatory Visit (INDEPENDENT_AMBULATORY_CARE_PROVIDER_SITE_OTHER): Payer: BC Managed Care – PPO | Admitting: Internal Medicine

## 2016-11-24 ENCOUNTER — Encounter: Payer: Self-pay | Admitting: Internal Medicine

## 2016-11-24 VITALS — BP 120/70 | HR 96 | Temp 98.3°F | Wt 205.9 lb

## 2016-11-24 DIAGNOSIS — I10 Essential (primary) hypertension: Secondary | ICD-10-CM | POA: Diagnosis not present

## 2016-11-24 DIAGNOSIS — Z79899 Other long term (current) drug therapy: Secondary | ICD-10-CM

## 2016-11-24 NOTE — Progress Notes (Signed)
Chief Complaint  Patient presents with  . Medication Management    HPI: [name] 61 y.o. come in for SDA   Today    She heard on news and checked into the fact that valsartan manufacatured  In [place]  Poss recall with contaminated  Factors   CVs found    4 pills from [place] [facility]   Not comfortable with all of this  ? What to tdo?   Asks about  Use of valsartan  Working on control and on diuretic .   No obv se of meds at this time  ROS: See pertinent positives and negatives per HPI.  Past Medical History:  Diagnosis Date  . Anal fissure   . Atrial septal aneurysm / if pfo  echo 6 13  10/23/2011  . Depression   . Fatty liver    "pre fatty liver"  . Fibromyalgia   . GERD (gastroesophageal reflux disease)    hx of, none in a long time  . Headache(784.0)    hx of migraines when younger  . Heart palpitations    hx with normal holter event monitoring  . Hx: UTI (urinary tract infection)   . Hypertension   . Hypothyroidism   . Obesity   . Pneumonia 1972   hx of  . Polyp of colon   . 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 Skin cancer    basal, squamous cell    Family History  Problem Relation Age of Onset  . Hypertension Mother        low borderline  . Osteoporosis Mother   . Hypertension Father   . Liver disease Father        amyloid deceased  . Hyperlipidemia Father   . Melanoma Sister   . Cancer Sister   . Juvenile Diabetes Daughter   . ADD / ADHD Child   . Hyperlipidemia Unknown        Maternal grandmother  . Colon cancer Neg Hx   . Stomach cancer Neg Hx     Social History   Social History  . Marital status: Married    Spouse name: N/A  . Number of children: 6  . Years of education: N/A   Occupational History  . 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 Smoking status: Never Smoker  . Smokeless tobacco: Never Used  . Alcohol use Yes     Comment: rare  . Drug use: No  . Sexual activity: Not Asked   Other Topics  Concern  . 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 15 left shoulder surgery    Married   Special educ   HH of 4   3 outside dogs, 8 goats, 40 chickens and quail. [place]   Job stresses   Ex husband passes away   Not working after injur at school  Shoulder neck                    Outpatient Medications Prior to Visit  Medication Sig Dispense Refill  . Ascorbic Acid (VITAMIN C) 1000 MG tablet Take 1,000 mg by mouth daily.    . Cholecalciferol (VITAMIN D3) 10000 UNITS capsule Take 10,000 Units by mouth daily.    Marland Kitchen DHEA 10 MG TABS Take 1 tablet by mouth daily.     . hydrochlorothiazide (HYDRODIURIL) 25 MG tablet TAKE 1  TABLET BY MOUTH EVERY DAY OR AS DIRECTED 90 tablet 1  . hydrochlorothiazide (MICROZIDE) 12.5 MG capsule Take 12.5 mg by mouth daily.     . Iodine, Kelp, TABS Take 1 tablet by mouth daily.    Marland Kitchen KLOR-CON M20 20 MEQ tablet TAKE 1 TABLET BY MOUTH EVERY DAY 30 tablet 3  . Krill Oil 1000 MG CAPS Take 1 capsule by mouth 2 (two) times daily.    . Magnesium 250 MG TABS Take 2 tablets by mouth daily.    . Multiple Vitamin (MULTIVITAMIN WITH MINERALS) TABS tablet Take 1 tablet by mouth daily.    Marland Kitchen NATURE-THROID 16.25 MG TABS Take 1 tablet by mouth daily.     Marland Kitchen NATURE-THROID 97.5 MG TABS Take 1 tablet by mouth daily.     Marland Kitchen OVER THE COUNTER MEDICATION Take 2 tablets by mouth 2 (two) times daily. Reported on 08/20/2015    . Probiotic Product (PROBIOTIC PO) Take 1 capsule by mouth daily.     . Progesterone Micronized (PROGESTERONE PO) Take 150 mg by mouth daily.     Marland Kitchen Ubiquinol 100 MG CAPS Take 1 capsule by mouth daily.    . valsartan (DIOVAN) 320 MG tablet TAKE 1 TABLET BY MOUTH EVERY DAY 90 tablet 2  . VITAMIN K PO Take 400 mcg by mouth daily.     No facility-administered medications prior to visit.      EXAM:  BP 120/70 (BP Location: Left Arm, Patient Position: Sitting, Cuff Size: Large)    Pulse 96   Temp 98.3 F (36.8 C) (Oral)   Wt 205 lb 14.4 oz (93.4 kg)   BMI 33.74 kg/m   Body mass index is 33.74 kg/m.  GENERAL: vitals reviewed and listed above, alert, oriented, appears well hydrated and in no acute distress MS: moves all extremities without noticeable focal  Abnormality attention to small nodular are right index mcp  PSYCH: pleasant and cooperative, no obvious depression or anxiety Lab Results  Component Value Date   WBC 9.1 07/23/2016   HGB 13.8 07/23/2016   HCT 40.6 07/23/2016   PLT 228.0 07/23/2016   GLUCOSE 84 07/23/2016   CHOL 244 (H) 07/23/2016   TRIG 60.0 07/23/2016   HDL 62.20 07/23/2016   LDLDIRECT 165.2 12/27/2012   LDLCALC 169 (H) 07/23/2016   ALT 31 07/23/2016   AST 20 07/23/2016   NA 142 07/23/2016   K 3.9 07/23/2016   CL 104 07/23/2016   CREATININE 0.80 07/23/2016   BUN 22 07/23/2016   CO2 28 07/23/2016   TSH 0.85 07/23/2016   HGBA1C 5.2 06/01/2013   BP Readings from Last 3 Encounters:  11/24/16 120/70  07/30/16 100/68  01/31/16 136/70    ASSESSMENT AND PLAN:  Discussed the following assessment and plan:  Essential hypertension  Medication management May be able to decrease  Strength of med but  Disc arb positives  Other options as opposed to stopping all together  But immediately danger if off med for now   Will look into other pharmacy options  and let me know   Consider change to another arb  l0wer dose intensity -Patient advised to return or notify health care team  if  new concerns arise.  Patient Instructions  I agree with you caution about generics from [place] and a based on current flags. Otherwise valsartan is a very good medication. It is an ace receptor blocker There are other medications in the group that you should be able to tolerate but I cannot  tell you where they are manufactured.  Some of these names are  Losartan Candesartan  irbesartan telmisartan olmesartan  Let us know  If you want to change based  onavailablitly  I do not think you are in   Current danget to health but agree changing  Is wise at this time.       Standley Brooking. Antoinette Borgwardt M.D.

## 2016-11-24 NOTE — Patient Instructions (Addendum)
I agree with you caution about generics from [place] and a based on current flags. Otherwise valsartan is a very good medication. It is an ace receptor blocker There are other medications in the group that you should be able to tolerate but I cannot tell you where they are manufactured.  Some of these names are  Losartan Candesartan  irbesartan telmisartan olmesartan  Let us know  If you want to change based onavailablitly  I do not think you are in   Current danget to health but agree changing  Is wise at this time.

## 2016-11-25 ENCOUNTER — Encounter: Payer: Self-pay | Admitting: Internal Medicine

## 2016-11-27 ENCOUNTER — Encounter: Payer: Self-pay | Admitting: Internal Medicine

## 2016-11-28 ENCOUNTER — Telehealth: Payer: Self-pay | Admitting: Emergency Medicine

## 2016-11-28 NOTE — Telephone Encounter (Addendum)
Please check my chart messages  Conversation with patient as we were trying off the medicine       Contact patient  and if  Patient wants to try  Another med   And can send in  Losartan 50 mg  1 po qd to try disp 30  And fu bp in a month

## 2016-11-28 NOTE — Telephone Encounter (Signed)
Pharmacy sent a request in stating that the patient medication Valsartan is one of the medication that has been recalled. Patient needs an alternative. Please advise.

## 2016-12-01 NOTE — Telephone Encounter (Signed)
Sounds like a good plan

## 2016-12-01 NOTE — Telephone Encounter (Signed)
Patient states that she wants to hold off on medication because her blood pressure has been doing good. This weekend readings. Patient states that she will send in reading through mychart  120/74 118/80

## 2016-12-11 ENCOUNTER — Encounter: Payer: Self-pay | Admitting: Emergency Medicine

## 2016-12-22 DIAGNOSIS — R5381 Other malaise: Secondary | ICD-10-CM | POA: Diagnosis not present

## 2016-12-22 DIAGNOSIS — N951 Menopausal and female climacteric states: Secondary | ICD-10-CM | POA: Diagnosis not present

## 2016-12-22 DIAGNOSIS — H43391 Other vitreous opacities, right eye: Secondary | ICD-10-CM | POA: Diagnosis not present

## 2016-12-22 DIAGNOSIS — E049 Nontoxic goiter, unspecified: Secondary | ICD-10-CM | POA: Diagnosis not present

## 2016-12-28 ENCOUNTER — Encounter: Payer: Self-pay | Admitting: Internal Medicine

## 2016-12-29 ENCOUNTER — Encounter: Payer: Self-pay | Admitting: Internal Medicine

## 2016-12-31 DIAGNOSIS — E039 Hypothyroidism, unspecified: Secondary | ICD-10-CM | POA: Diagnosis not present

## 2016-12-31 DIAGNOSIS — R5383 Other fatigue: Secondary | ICD-10-CM | POA: Diagnosis not present

## 2016-12-31 DIAGNOSIS — J309 Allergic rhinitis, unspecified: Secondary | ICD-10-CM | POA: Diagnosis not present

## 2016-12-31 DIAGNOSIS — M797 Fibromyalgia: Secondary | ICD-10-CM | POA: Diagnosis not present

## 2017-01-09 ENCOUNTER — Ambulatory Visit (AMBULATORY_SURGERY_CENTER): Payer: Self-pay | Admitting: *Deleted

## 2017-01-09 ENCOUNTER — Ambulatory Visit (INDEPENDENT_AMBULATORY_CARE_PROVIDER_SITE_OTHER): Payer: BC Managed Care – PPO | Admitting: Internal Medicine

## 2017-01-09 ENCOUNTER — Encounter: Payer: Self-pay | Admitting: Internal Medicine

## 2017-01-09 ENCOUNTER — Other Ambulatory Visit (INDEPENDENT_AMBULATORY_CARE_PROVIDER_SITE_OTHER): Payer: BC Managed Care – PPO

## 2017-01-09 VITALS — BP 130/80 | HR 98 | Temp 98.4°F | Wt 201.0 lb

## 2017-01-09 VITALS — Ht 66.0 in | Wt 201.0 lb

## 2017-01-09 DIAGNOSIS — R0989 Other specified symptoms and signs involving the circulatory and respiratory systems: Secondary | ICD-10-CM | POA: Diagnosis not present

## 2017-01-09 DIAGNOSIS — Z79899 Other long term (current) drug therapy: Secondary | ICD-10-CM

## 2017-01-09 DIAGNOSIS — I10 Essential (primary) hypertension: Secondary | ICD-10-CM

## 2017-01-09 DIAGNOSIS — Z86010 Personal history of colonic polyps: Secondary | ICD-10-CM

## 2017-01-09 LAB — BASIC METABOLIC PANEL
BUN: 15 mg/dL (ref 6–23)
CO2: 29 mEq/L (ref 19–32)
Calcium: 10.1 mg/dL (ref 8.4–10.5)
Chloride: 98 mEq/L (ref 96–112)
Creatinine, Ser: 0.82 mg/dL (ref 0.40–1.20)
GFR: 74.84 mL/min (ref >60.00)
Glucose, Bld: 98 mg/dL (ref 70–99)
Potassium: 4 mEq/L (ref 3.5–5.1)
Sodium: 135 mEq/L (ref 135–145)

## 2017-01-09 LAB — HEPATIC FUNCTION PANEL
ALT: 31 U/L (ref 0–35)
AST: 22 U/L (ref 0–37)
Albumin: 4.6 g/dL (ref 3.5–5.2)
Alkaline Phosphatase: 91 U/L (ref 39–117)
Bilirubin, Direct: 0.1 mg/dL (ref 0.0–0.3)
Total Bilirubin: 0.6 mg/dL (ref 0.2–1.2)
Total Protein: 7.3 g/dL (ref 6.0–8.3)

## 2017-01-09 LAB — MAGNESIUM: Magnesium: 2.1 mg/dL (ref 1.5–2.5)

## 2017-01-09 MED ORDER — NA SULFATE-K SULFATE-MG SULF 17.5-3.13-1.6 GM/177ML PO SOLN: 1.0000 [IU] | Freq: Once | ORAL | 0 refills | Status: AC

## 2017-01-09 NOTE — Patient Instructions (Signed)
Decrease  Blood pressure readings to twice a day only .  Goal is 120/80 Lab  Today  At [facility]   Let you know and then go from there.

## 2017-01-09 NOTE — Progress Notes (Signed)
Chief Complaint  Patient presents with  . Follow-up    HPI: [name] 61 y.o. come in for Chronic disease management   Has a list of concerns here with husband .   BP issues   Had to stop the valsartan because of the valsartan recall is trying off antihypertensive medicine although had been very well-controlled continue lifestyle intervention.  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 ROS: See pertinent positives and negatives per HPI.  Past Medical History:  Diagnosis Date  . Anal fissure   . Atrial septal aneurysm / if pfo  echo 6 13  10/23/2011  . Cataract    both eyes  . Depression   . Fatty liver    "pre fatty liver"  . Fibromyalgia   . Headache(784.0)    hx of migraines when younger  . Heart palpitations    hx with normal holter event monitoring  . Hx: UTI (urinary tract infection)   . Hyperlipidemia   . Hypertension   . Hypothyroidism   . Obesity   . Pneumonia 1972   hx of  . Polyp of colon   . 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 Skin cancer    basal, squamous cell    Family History  Problem Relation Age of Onset  . Hypertension Mother        low borderline  . Osteoporosis Mother   . Hypertension Father   . Liver disease Father        amyloid deceased  . Hyperlipidemia Father   . Melanoma Sister   . Cancer Sister   . Juvenile Diabetes Daughter   . ADD / ADHD Child   . Hyperlipidemia Unknown        Maternal grandmother  . Colon cancer Neg Hx   . Stomach cancer Neg Hx   . Colon polyps Neg Hx   . Esophageal cancer Neg Hx   . Rectal cancer Neg Hx     Social History   Social History  . Marital status: Married    Spouse name: N/A  . Number of children: 6  . Years of education:  N/A   Occupational History  . TEACHER [employer] [place] History Main Topics  . Smoking status: Never Smoker  . Smokeless tobacco: Never Used  . Alcohol use Yes     Comment: rare  . Drug use: No  . Sexual activity: Not Asked   Other Topics Concern  . 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 15 left shoulder surgery    Married   Special educ   HH of 4   3 outside dogs, 8 goats, 40 chickens and quail. [place]   Job stresses   Ex husband passes away   Not working after injur at school  Shoulder neck                    Outpatient Medications Prior to Visit  Medication Sig Dispense Refill  . Ascorbic Acid (VITAMIN C) 1000 MG tablet Take 1,000 mg  by mouth daily.    . Cholecalciferol (VITAMIN D3) 10000 UNITS capsule Take 10,000 Units by mouth daily.    Marland Kitchen DHEA 10 MG TABS Take 1 tablet by mouth daily.     . hydrochlorothiazide (HYDRODIURIL) 25 MG tablet TAKE 1 TABLET BY MOUTH EVERY DAY OR AS DIRECTED 90 tablet 1  . KLOR-CON M20 20 MEQ tablet TAKE 1 TABLET BY MOUTH EVERY DAY 30 tablet 3  . NATURE-THROID 16.25 MG TABS Take 1 tablet by mouth daily.     Marland Kitchen NATURE-THROID 97.5 MG TABS Take 1 tablet by mouth daily.     . Progesterone Micronized (PROGESTERONE PO) Take 150 mg by mouth daily.     Marland Kitchen VITAMIN K PO Take 400 mcg by mouth daily.    . hydrochlorothiazide (MICROZIDE) 12.5 MG capsule Take 12.5 mg by mouth daily.     . Iodine, Kelp, TABS Take 1 tablet by mouth daily.    Javier Docker Oil 1000 MG CAPS Take 1 capsule by mouth 2 (two) times daily.    . Magnesium 250 MG TABS Take 2 tablets by mouth daily.    . Multiple Vitamin (MULTIVITAMIN WITH MINERALS) TABS tablet Take 1 tablet by mouth daily.    Marland Kitchen OVER THE COUNTER MEDICATION Take 2 tablets by mouth 2 (two) times daily. Reported on 08/20/2015    . Probiotic Product (PROBIOTIC PO) Take 1 capsule by mouth daily.     Marland Kitchen Ubiquinol 100 MG CAPS Take 1  capsule by mouth daily.    . valsartan (DIOVAN) 320 MG tablet TAKE 1 TABLET BY MOUTH EVERY DAY 90 tablet 2   No facility-administered medications prior to visit.      EXAM:  BP 130/80 (BP Location: Left Arm, Patient Position: Sitting, Cuff Size: Large)   Pulse 98   Temp 98.4 F (36.9 C) (Oral)   Wt 201 lb (91.2 kg)   BMI 32.94 kg/m   Body mass index is 32.94 kg/m.  GENERAL: vitals reviewed and listed above, alert, oriented, appears well hydrated and in no acute distress PSYCH: pleasant and cooperative, no obvious depression or anxiety Lab Results  Component Value Date   WBC 9.1 07/23/2016   HGB 13.8 07/23/2016   HCT 40.6 07/23/2016   PLT 228.0 07/23/2016   GLUCOSE 84 07/23/2016   CHOL 244 (H) 07/23/2016   TRIG 60.0 07/23/2016   HDL 62.20 07/23/2016   LDLDIRECT 165.2 12/27/2012   LDLCALC 169 (H) 07/23/2016   ALT 31 07/23/2016   AST 20 07/23/2016   NA 142 07/23/2016   K 3.9 07/23/2016   CL 104 07/23/2016   CREATININE 0.80 07/23/2016   BUN 22 07/23/2016   CO2 28 07/23/2016   TSH 0.85 07/23/2016   HGBA1C 5.2 06/01/2013   BP Readings from Last 3 Encounters:  01/09/17 130/80  11/24/16 120/70  07/30/16 100/68   Wt Readings from Last 3 Encounters:  01/09/17 201 lb (91.2 kg)  01/09/17 201 lb (91.2 kg)  11/24/16 205 lb 14.4 oz (93.4 kg)    Blood pressure monitor log reviewed.  ASSESSMENT AND PLAN:  Discussed the following assessment and plan:  Essential hypertension - Plan: Magnesium, Hepatic function panel, Basic metabolic panel  Medication management - Plan: Magnesium, Hepatic function panel, Basic metabolic panel  Labile hypertension Answered multiple questions as best possible in context.I do not think high-sensitivity CRP or nontraditional testing is going to help her long-term health. She could consider a coronary artery calcium to look at cardiovascular rix to consider a statin medicine.  Discussed blood pressure control and medications that A's and are  are used to prevent kidney failure and even though risk of kidney failure is on the adverse side effects reassured her that candesartan is in the same group as valsartan. At being said at least half of her blood pressures are in control off of the valsartan for a while. We can try staying on the diuretic plus potassium check her lab tests we can always add medication back. She is taken measures significant lifestyle since she was put on the valsartan may have help her blood pressure. Also want her to avoid excess blood pressure readings and focusing with anxiety. Increase activity level as discussed. She had to leave today to go to a colonoscopy pre certification depending on her labs and readings plan further follow-up or go on an ace receptor blocker. I did review with her and effects of diuretics and arbs  On  potassium magnesium  Total visit 56mins > 50% spent counseling and coordinating care as indicated in above note and in instructions to patient .  reviewed data and fu  -Patient advised to return or notify health care team  if  new concerns arise.  Patient Instructions  Decrease  Blood pressure readings to twice a day only .  Goal is 120/80 Lab  Today  At [facility]   Let you know and then go from there.        Standley Brooking. Tameca Jerez M.D.

## 2017-01-09 NOTE — Progress Notes (Signed)
Eggs causes gas but no allergy or soy allergy known to patient  No issues with past sedation with any surgeries  or procedures, no intubation problems  No diet pills per patient No home 02 use per patient  No blood thinners per patient  Pt denies issues with constipation  No A fib or A flutter  EMMI video sent to pt's e mail

## 2017-01-13 ENCOUNTER — Encounter: Payer: Self-pay | Admitting: Gastroenterology

## 2017-01-19 DIAGNOSIS — M25511 Pain in right shoulder: Secondary | ICD-10-CM | POA: Diagnosis not present

## 2017-01-19 DIAGNOSIS — M79604 Pain in right leg: Secondary | ICD-10-CM | POA: Diagnosis not present

## 2017-01-19 DIAGNOSIS — M79605 Pain in left leg: Secondary | ICD-10-CM | POA: Diagnosis not present

## 2017-01-19 DIAGNOSIS — M25512 Pain in left shoulder: Secondary | ICD-10-CM | POA: Diagnosis not present

## 2017-01-21 ENCOUNTER — Other Ambulatory Visit: Payer: Self-pay | Admitting: Internal Medicine

## 2017-01-22 DIAGNOSIS — M79605 Pain in left leg: Secondary | ICD-10-CM | POA: Diagnosis not present

## 2017-01-22 DIAGNOSIS — M25512 Pain in left shoulder: Secondary | ICD-10-CM | POA: Diagnosis not present

## 2017-01-22 DIAGNOSIS — M25511 Pain in right shoulder: Secondary | ICD-10-CM | POA: Diagnosis not present

## 2017-01-22 DIAGNOSIS — M79604 Pain in right leg: Secondary | ICD-10-CM | POA: Diagnosis not present

## 2017-01-26 ENCOUNTER — Encounter: Payer: Self-pay | Admitting: Gastroenterology

## 2017-01-26 ENCOUNTER — Ambulatory Visit (AMBULATORY_SURGERY_CENTER): Payer: BC Managed Care – PPO | Admitting: Gastroenterology

## 2017-01-26 VITALS — BP 139/76 | HR 62 | Temp 98.4°F | Resp 10 | Ht 66.0 in | Wt 201.0 lb

## 2017-01-26 DIAGNOSIS — K635 Polyp of colon: Secondary | ICD-10-CM | POA: Diagnosis not present

## 2017-01-26 DIAGNOSIS — D125 Benign neoplasm of sigmoid colon: Secondary | ICD-10-CM | POA: Diagnosis not present

## 2017-01-26 DIAGNOSIS — Z86010 Personal history of colonic polyps: Secondary | ICD-10-CM

## 2017-01-26 MED ORDER — SODIUM CHLORIDE 0.9 % IV SOLN: 500.0000 mL | INTRAVENOUS | Status: DC

## 2017-01-26 NOTE — Patient Instructions (Signed)
**   Handouts were given on polyps and hemorrhoids **  YOU HAD AN ENDOSCOPIC PROCEDURE TODAY AT [facility]:   Refer to the procedure report that was given to you for any specific questions about what was found during the examination.  If the procedure report does not answer your questions, please call your gastroenterologist to clarify.  If you requested that your care partner not be given the details of your procedure findings, then the procedure report has been included in a sealed envelope for you to review at your convenience later.  YOU SHOULD EXPECT: Some feelings of bloating in the abdomen. Passage of more gas than usual.  Walking can help get rid of the air that was put into your GI tract during the procedure and reduce the bloating. If you had a lower endoscopy (such as a colonoscopy or flexible sigmoidoscopy) you may notice spotting of blood in your stool or on the toilet paper. If you underwent a bowel prep for your procedure, you may not have a normal bowel movement for a few days.  Please Note:  You might notice some irritation and congestion in your nose or some drainage.  This is from the oxygen used during your procedure.  There is no need for concern and it should clear up in a day or so.  SYMPTOMS TO REPORT IMMEDIATELY:   Following lower endoscopy (colonoscopy or flexible sigmoidoscopy):  Excessive amounts of blood in the stool  Significant tenderness or worsening of abdominal pains  Swelling of the abdomen that is new, acute  Fever of 100F or higher  For urgent or emergent issues, a gastroenterologist can be reached at any hour by calling [phone].   DIET:  We do recommend a small meal at first, but then you may proceed to your regular diet.  Drink plenty of fluids but you should avoid alcoholic beverages for 24 hours.  ACTIVITY:  You should plan to take it easy for the rest of today and you should NOT DRIVE or use heavy machinery until tomorrow  (because of the sedation medicines used during the test).    FOLLOW UP: Our staff will call the number listed on your records the next business day following your procedure to check on you and address any questions or concerns that you may have regarding the information given to you following your procedure. If we do not reach you, we will leave a message.  However, if you are feeling well and you are not experiencing any problems, there is no need to return our call.  We will assume that you have returned to your regular daily activities without incident.  If any biopsies were taken you will be contacted by phone or by letter within the next 1-3 weeks.  Please call us at [phone] if you have not heard about the biopsies in 3 weeks.    SIGNATURES/CONFIDENTIALITY: You and/or your care partner have signed paperwork which will be entered into your electronic medical record.  These signatures attest to the fact that that the information above on your After Visit Summary has been reviewed and is understood.  Full responsibility of the confidentiality of this discharge information lies with you and/or your care-partner.

## 2017-01-26 NOTE — Op Note (Signed)
[facility] Patient Name: [name] Procedure Date: 01/26/2017 10:11 AM MRN: [id number] Endoscopist: Ladene Artist , MD Age: 61 Referring MD:  Date of Birth: 1955/10/29 Gender: Female Account #: [id number] Procedure:                Colonoscopy Indications:              Surveillance: Personal history of adenomatous                            polyps on last colonoscopy 3 years ago Medicines:                Monitored Anesthesia Care Procedure:                Pre-Anesthesia Assessment:                           - Prior to the procedure, a History and Physical                            was performed, and patient medications and                            allergies were reviewed. The patient's tolerance of                            previous anesthesia was also reviewed. The risks                            and benefits of the procedure and the sedation                            options and risks were discussed with the patient.                            All questions were answered, and informed consent                            was obtained. Prior Anticoagulants: The patient has                            taken no previous anticoagulant or antiplatelet                            agents. ASA Grade Assessment: II - A patient with                            mild systemic disease. After reviewing the risks                            and benefits, the patient was deemed in                            satisfactory condition to undergo the procedure.  After obtaining informed consent, the colonoscope                            was passed under direct vision. Throughout the                            procedure, the patient's blood pressure, pulse, and                            oxygen saturations were monitored continuously. The                            Model PCF-[id number]) scope was introduced                            through the anus  and advanced to the the cecum,                            identified by appendiceal orifice and ileocecal                            valve. The ileocecal valve, appendiceal orifice,                            and rectum were photographed. The quality of the                            bowel preparation was excellent. The colonoscopy                            was performed without difficulty. The patient                            tolerated the procedure well. Scope In: 10:19:14 AM Scope Out: 10:36:15 AM Scope Withdrawal Time: 0 hours 11 minutes 30 seconds  Total Procedure Duration: 0 hours 17 minutes 1 second  Findings:                 The perianal and digital rectal examinations were                            normal.                           Three sessile polyps were found in the sigmoid                            colon. The polyps were 5 to 8 mm in size. These                            polyps were removed with a cold snare. Resection                            and retrieval were complete.  Internal hemorrhoids were found during                            retroflexion. The hemorrhoids were small and Grade                            I (internal hemorrhoids that do not prolapse).                           The exam was otherwise without abnormality on                            direct and retroflexion views. Complications:            No immediate complications. Estimated blood loss:                            None. Estimated Blood Loss:     Estimated blood loss: none. Impression:               - Three 5 to 8 mm polyps in the sigmoid colon,                            removed with a cold snare. Resected and retrieved.                           - Internal hemorrhoids.                           - The examination was otherwise normal on direct                            and retroflexion views. Recommendation:           - Repeat colonoscopy in 5 years for  surveillance.                           - Patient has a contact number available for                            emergencies. The signs and symptoms of potential                            delayed complications were discussed with the                            patient. Return to normal activities tomorrow.                            Written discharge instructions were provided to the                            patient.                           - Resume previous diet.                           -  Continue present medications.                           - Await pathology results. Ladene Artist, MD 01/26/2017 10:39:31 AM This report has been signed electronically.

## 2017-01-26 NOTE — Progress Notes (Signed)
Called to room to assist during endoscopic procedure.  Patient ID and intended procedure confirmed with present staff. Received instructions for my participation in the procedure from the performing physician.  

## 2017-01-26 NOTE — Progress Notes (Signed)
Report to PACU, RN, vss, BBS= Clear.  

## 2017-01-27 ENCOUNTER — Telehealth: Payer: Self-pay

## 2017-01-27 DIAGNOSIS — M25511 Pain in right shoulder: Secondary | ICD-10-CM | POA: Diagnosis not present

## 2017-01-27 DIAGNOSIS — M79605 Pain in left leg: Secondary | ICD-10-CM | POA: Diagnosis not present

## 2017-01-27 DIAGNOSIS — M25512 Pain in left shoulder: Secondary | ICD-10-CM | POA: Diagnosis not present

## 2017-01-27 DIAGNOSIS — M79604 Pain in right leg: Secondary | ICD-10-CM | POA: Diagnosis not present

## 2017-01-27 NOTE — Telephone Encounter (Signed)
  Follow up Call-  Call [name] number 01/26/2017  Post procedure Call [name] phone  # [phone]  Permission to leave phone message Yes  Some recent data might be hidden     Patient questions:  Do you have a fever, pain , or abdominal swelling? No. Pain Score  0 *  Have you tolerated food without any problems? Yes.    Have you been able to return to your normal activities? Yes.    Do you have any questions about your discharge instructions: Diet   No. Medications  No. Follow up visit  No.  Do you have questions or concerns about your Care? No.  Actions: * If pain score is 4 or above: No action needed, pain <4.

## 2017-01-29 DIAGNOSIS — M79605 Pain in left leg: Secondary | ICD-10-CM | POA: Diagnosis not present

## 2017-01-29 DIAGNOSIS — M25511 Pain in right shoulder: Secondary | ICD-10-CM | POA: Diagnosis not present

## 2017-01-29 DIAGNOSIS — M25512 Pain in left shoulder: Secondary | ICD-10-CM | POA: Diagnosis not present

## 2017-01-29 DIAGNOSIS — M79604 Pain in right leg: Secondary | ICD-10-CM | POA: Diagnosis not present

## 2017-01-30 ENCOUNTER — Encounter: Payer: Self-pay | Admitting: Internal Medicine

## 2017-02-05 ENCOUNTER — Encounter: Payer: Self-pay | Admitting: Gastroenterology

## 2017-02-09 DIAGNOSIS — H35033 Hypertensive retinopathy, bilateral: Secondary | ICD-10-CM | POA: Diagnosis not present

## 2017-02-11 DIAGNOSIS — R5383 Other fatigue: Secondary | ICD-10-CM | POA: Diagnosis not present

## 2017-03-04 DIAGNOSIS — Z6832 Body mass index (BMI) 32.0-32.9, adult: Secondary | ICD-10-CM | POA: Diagnosis not present

## 2017-03-04 DIAGNOSIS — Z1231 Encounter for screening mammogram for malignant neoplasm of breast: Secondary | ICD-10-CM | POA: Diagnosis not present

## 2017-03-04 DIAGNOSIS — Z01419 Encounter for gynecological examination (general) (routine) without abnormal findings: Secondary | ICD-10-CM | POA: Diagnosis not present

## 2017-03-10 DIAGNOSIS — I10 Essential (primary) hypertension: Secondary | ICD-10-CM | POA: Diagnosis not present

## 2017-03-10 DIAGNOSIS — R5381 Other malaise: Secondary | ICD-10-CM | POA: Diagnosis not present

## 2017-03-10 DIAGNOSIS — E049 Nontoxic goiter, unspecified: Secondary | ICD-10-CM | POA: Diagnosis not present

## 2017-03-10 DIAGNOSIS — N951 Menopausal and female climacteric states: Secondary | ICD-10-CM | POA: Diagnosis not present

## 2017-03-18 DIAGNOSIS — D1800 Hemangioma unspecified site: Secondary | ICD-10-CM | POA: Diagnosis not present

## 2017-03-18 DIAGNOSIS — D225 Melanocytic nevi of trunk: Secondary | ICD-10-CM | POA: Diagnosis not present

## 2017-03-18 DIAGNOSIS — Z85828 Personal history of other malignant neoplasm of skin: Secondary | ICD-10-CM | POA: Diagnosis not present

## 2017-03-18 DIAGNOSIS — D2261 Melanocytic nevi of right upper limb, including shoulder: Secondary | ICD-10-CM | POA: Diagnosis not present

## 2017-03-23 DIAGNOSIS — R5383 Other fatigue: Secondary | ICD-10-CM | POA: Diagnosis not present

## 2017-04-13 ENCOUNTER — Other Ambulatory Visit: Payer: Self-pay | Admitting: Internal Medicine

## 2017-06-01 ENCOUNTER — Telehealth: Payer: Self-pay | Admitting: Internal Medicine

## 2017-06-01 DIAGNOSIS — H10811 Pingueculitis, right eye: Secondary | ICD-10-CM | POA: Diagnosis not present

## 2017-06-01 NOTE — Telephone Encounter (Signed)
I think the readings are in the goal range and acceptable   So keep up  Life style  And medication   Please  Confirm what medi she is taking since  The BP medication recall.    Lab Results  Component Value Date   WBC 9.1 07/23/2016   HGB 13.8 07/23/2016   HCT 40.6 07/23/2016   PLT 228.0 07/23/2016   GLUCOSE 98 01/09/2017   CHOL 244 (H) 07/23/2016   TRIG 60.0 07/23/2016   HDL 62.20 07/23/2016   LDLDIRECT 165.2 12/27/2012   LDLCALC 169 (H) 07/23/2016   ALT 31 01/09/2017   AST 22 01/09/2017   NA 135 01/09/2017   K 4.0 01/09/2017   CL 98 01/09/2017   CREATININE 0.82 01/09/2017   BUN 15 01/09/2017   CO2 29 01/09/2017   TSH 0.85 07/23/2016   HGBA1C 5.2 06/01/2013

## 2017-06-01 NOTE — Telephone Encounter (Signed)
Pt dropped off BP readings  These are in your yellow folder for review.   Please advise Dr Regis Bill, thanks.

## 2017-06-03 NOTE — Telephone Encounter (Signed)
Attempted to call pt; left voice message to return call.

## 2017-06-03 NOTE — Telephone Encounter (Signed)
Copied from [place]. Topic: General - Other >> Jun 03, 2017 10:26 AM Neva Seat wrote: Pt returned missed call regarding BP Rx recall.  Please call pt back anytime, except 2 to 3:15 today.

## 2017-06-03 NOTE — Telephone Encounter (Signed)
Left detailed message  Asked for a call back with BP meds verification to ensure she is not on one the meds being recalled.  Will await call back.

## 2017-06-04 NOTE — Telephone Encounter (Signed)
Noted  

## 2017-06-04 NOTE — Telephone Encounter (Signed)
rec'd call back from pt. with report of current blood pressure medication she is taking.  She reported she is only taking HCTZ 25 mg daily.  Questioned if this is on recall?  Noted on the Internet that one lot of HCTZ 25 mg is on recall.  Stated she is going to check her medication.  Will make Dr. Regis Bill aware.

## 2017-06-08 DIAGNOSIS — E042 Nontoxic multinodular goiter: Secondary | ICD-10-CM | POA: Diagnosis not present

## 2017-07-02 ENCOUNTER — Ambulatory Visit: Payer: Self-pay | Admitting: *Deleted

## 2017-07-02 ENCOUNTER — Ambulatory Visit: Payer: BC Managed Care – PPO | Admitting: Family Medicine

## 2017-07-02 ENCOUNTER — Encounter: Payer: Self-pay | Admitting: Family Medicine

## 2017-07-02 VITALS — BP 132/82 | HR 89 | Temp 98.4°F | Ht 66.0 in | Wt 201.0 lb

## 2017-07-02 DIAGNOSIS — E785 Hyperlipidemia, unspecified: Secondary | ICD-10-CM

## 2017-07-02 DIAGNOSIS — I10 Essential (primary) hypertension: Secondary | ICD-10-CM

## 2017-07-02 DIAGNOSIS — R079 Chest pain, unspecified: Secondary | ICD-10-CM

## 2017-07-02 LAB — TSH: TSH: 0.12 — AB (ref 0.41–5.90)

## 2017-07-02 NOTE — Telephone Encounter (Signed)
Thank you Cassie for calling patient and making sure that she was stable. We did refer to cardiology given her risk factors though symptoms were overall low risk for cardiac cause.  Luckily patient was ok and didn't need to go to the ER but there was some very high risk language in this note from [facility]. Dr. Regis Bill also was available by phone to discuss my concerns- which I greatly appreciate. Norlene Duel, Environmental education officer at [facility] was also available to help clarify the situation which I appreciate.   A few issues that need to be followed up here by admin/leadership 1. Flow coordinators that are spoken to need to be listed. Flow Coordinator at [facility] was not aware of the comments about troponin and d-dimer.  2. If someone is having chest pain even if EMS evaluated them- patient needs to still be triaged by [facility] and case discussed with provider if potentially high risk such as "may need troponin and d-dimer" 3. We had a difficult time reaching the [facility] to triage patient and also reaching admin to help address our concerns. I am thankful Cassie called but the point of the [facility] from my perspective is to help triage and this did not follow that protocol.  4. I would also suggest that PCP or primary office be contacted about potentially high risk situations instead of scheduling hours later with another office- luckily our team caught this and alerted me to the high risk situation.

## 2017-07-02 NOTE — Telephone Encounter (Signed)
Pt called because she had an episode of chest pain this morning over her left breast. She called the EMTs. They did an EKG on her. Her B/P was 150/90, HR 1154 and O2 sat 97%. She states she feels ok now but it was recommended by the EMT for her to be seen today by her pcp. They mentioned that she may need a D-Dimer and troponin done. Notified the flow coordinator to see if they could work the patient in. Unable to get in because of availability of providers.  [facility] was recommended. Flow was notified and they said that they could see this patient. Appointment was made for today with Dr. Yong Channel.

## 2017-07-02 NOTE — Telephone Encounter (Signed)
I called and spoke to patient. She states that she experienced sharp pain in her left chest this morning. She states it scared her so she called EMS who did an EKG that was normal. She states that EMS said as long as she sees her PCP she does not have to go to the ED. She states that she is currently in physical therapy for a pulled muscle in her neck. She states that pain lasted 10-15 minutes. Denies nausea, diarrhea, radiation and shortness of breath. I passed this information on to Dr Yong Channel.

## 2017-07-02 NOTE — Progress Notes (Signed)
Subjective:  [name] is a 62 y.o. year old very pleasant female patient who presents for/with See problem oriented charting ROS- no lightheadedness, nausea, vomiting, left arm pain or paresthesias. No shortness of breath.    Past Medical History-  Patient Active Problem List   Diagnosis Date Noted  . Abnormal LFTs 06/20/2015  . Hepatic steatosis 11/27/2014  . Hx of adenomatous colonic polyps 11/27/2014  . Visit for preventive health examination 03/28/2014  . Hyperlipidemia 03/28/2014  . Left rotator cuff tear 07/07/2013  . Tear of left rotator cuff 07/07/2013  . Rhinitis 06/01/2013  . Light headedness 06/01/2013  . Chest wall deformity 04/12/2012  . Atrial septal aneurysm / if pfo  echo 6 13  10/23/2011  . Hair loss 02/06/2011  . OSA (obstructive sleep apnea) 11/27/2010  . Dyslipidemia 11/01/2010  . Flushing 08/29/2010  . Labile hypertension 08/29/2010  . Stress at work 08/29/2010  . MUSCLE CRAMPS, FOOT 06/10/2010  . OTHER SLEEP DISTURBANCES 06/10/2010  . ANXIETY, SITUATIONAL 05/08/2010  . VERTIGO, POSITIONAL 03/15/2010  . VITAMIN D DEFICIENCY 02/18/2010  . OBESITY 02/18/2010  . ALLERGIC RHINITIS 02/18/2010  . PLANTAR FASCIITIS 02/18/2010  . TWITCHING 06/11/2009  . NUMBNESS, HAND 06/11/2009  . CERVICAL STRAIN, ACUTE 06/11/2009  . OTHER MALAISE AND FATIGUE 07/09/2007  . RHINITIS 07/02/2007  . HEADACHE 05/03/2007  . HYPERTENSION 01/28/2007  . DIZZINESS 01/28/2007  . FASTING HYPERGLYCEMIA 01/28/2007  . HYPOTHYROIDISM 11/11/2006  . DEPRESSION 11/11/2006  . COMMON MIGRAINE 11/11/2006  . GERD 11/11/2006  . FIBROMYALGIA 11/11/2006  . Elevated alkaline phosphatase level 11/11/2006    Medications- reviewed and updated Current Outpatient Medications  Medication Sig Dispense Refill  . Ascorbic Acid (VITAMIN C) 1000 MG tablet Take 1,000 mg by mouth daily.    . Cholecalciferol (VITAMIN D3) 10000 UNITS capsule Take 10,000 Units by mouth daily.    Marland Kitchen DHEA 10 MG TABS  Take 1 tablet by mouth daily.     . hydrochlorothiazide (HYDRODIURIL) 25 MG tablet TAKE 1 TABLET BY MOUTH EVERY DAY OR AS DIRECTED 90 tablet 1  . KLOR-CON M20 20 MEQ tablet TAKE 1 TABLET BY MOUTH EVERY DAY 30 tablet 5  . NATURE-THROID 16.25 MG TABS Take 1 tablet by mouth daily.     Marland Kitchen NATURE-THROID 97.5 MG TABS Take 1 tablet by mouth daily.     . Progesterone Micronized (PROGESTERONE PO) Take 150 mg by mouth daily.     . Turmeric 500 MG CAPS Take 2 capsules by mouth 2 (two) times daily.    Marland Kitchen VITAMIN K PO Take 400 mcg by mouth daily.       Objective: BP 132/82 (BP Location: Left Arm, Patient Position: Sitting, Cuff Size: Large)   Pulse 89   Temp 98.4 F (36.9 C) (Oral)   Ht 5\' 6"  (1.676 m)   Wt 201 lb (91.2 kg)   SpO2 97%   BMI 32.44 kg/m  Gen: NAD, resting comfortably CV: RRR no murmurs rubs or gallops Patient has some chest wall tenderness but is equal and does not reproduce pain from earlier today.  Lungs: CTAB no crackles, wheeze, rhonchi Abdomen: soft/nontender/nondistended/normal bowel sounds. No rebound or guarding.  Ext: no edema Skin: warm, dry Neuro: grossly normal, moves all extremities   EKG: sinus rhythm with rate 81, left axis, normal intervals, no hypertrophy, no significant st or t wave changes other than somewhat flat t waves III and avf. I disagree with prominent R in V1- low voltage only. Comparison to 09/25/15 is unchanged.  Assessment/Plan:  Chest pain, unspecified type - Plan: EKG [id number], Basic metabolic panel, CBC, TSH, Ambulatory referral to Cardiology  Essential hypertension - Plan: Basic metabolic panel S: slept on sofa overnight (husband was snoring). Had sharp pain in left upper chest. Pain did not wake her from sleep. Pain perhaps 3/10. Felt like a pulled muscle (doesn't normally sleep in couch and in position she was in). Has been having some neck issues due to shoulder surgery and has been going to PT. Went to chiropractor yesterday- uncomfortable  overall.   She had some palpitations. She tried to cough and did some deep breathing thinking it would help the palpitations- has had some palpitations in the past but never this strong. When she walked to get her husband- palpitations worsened but chest pain did not worsen.  EMS came out to the house and did vital signs. BP was 150/90 which is not normal for her and pulse was 115. By the time EMS came the pain went away while they were there, palpitations also resolved. About 15 minutes total of pain and palpitations.   No dizziness with this- though does have some on and off dizziness for years on hctz. No shortness of breath. No left arm or neck pain (other than her normal left neck pain and soreness from chiropractor, pt and needling).   A/P: 62 year old female with uncontrolled hyperlipidemia (though 10 year risk only 6.8% per ascvd risk estimator- she states would firmly decline statin), well controlled hypertension, obesity presents with chest pain. Grandmother had a stroke but no other cardiovascular family history.  I suspect her overall risk for cardiac cause of chest pain is low- sharp left sided chest pain that was not exertional. Suspect it was MSK in origin (possibly from sleeping on couch) and then anxiety provoked palpitations. With her risk profile though and after discussion with patient- she would prefer to see cardiology to get their opinion on potential stress testing given the chest pain and palpitations. I referred her today. We discussed if she had new or worsening symptoms she should seek care immediately. I did advise an 81 mg aspirin daily until cardiology evaluation. We discussed possible statin and she declined.   For palpitations- we will update her TSH. Also get bmet in case electrolyte issues prompted palpitations. Finally CBC given chest pain to evaluate for anemia.   In regards to the conversation with EMS- apparently patient had asked them about potential workup and  these were discussed in conversation but she was never told that she specifically needed those items for workup. I explained to her that typically if we had enough concern to do a troponin that we would want a patient seen in ER (though not 100% of the time) and we discussed possible d dimer but she has no leg swelling, no recent prolonged travel, had no shortness of breath, is not tachycardic at this time, has no chest pain- strongly doubt PE so we opted to hold off on d dimer and troponin.   Lab/Order associations: Chest pain, unspecified type - Plan: EKG 04-VWUJ, Basic metabolic panel, CBC, TSH, Ambulatory referral to Cardiology  Essential hypertension - Plan: Basic metabolic panel  Time Stamp The duration of face-to-face time during this visit was greater than 40 (3:00 PM to 3:46 PM) minutes. Greater than 50% of this time was spent in counseling, explanation of diagnosis, planning of further management, and/or coordination of care including discussion of terms/tests EMS suggested and reasons I did no tthink those  were appropriate in her case, discussion of next steps, reasoning for lab tests, ED return precautions, possible causes (MSK likely, cardiac less likely), comforting patient anxieties  Return precautions advised.  Garret Reddish, MD

## 2017-07-02 NOTE — Patient Instructions (Addendum)
Please stop by lab before you go  We will call you within a week or two about your referral to cardiology. If you do not hear within 3 weeks, give [place] a call.  If you have recurrence of chest pain or palpitatoins please seek care immediately  Also please take 1 aspirin 81mg  a day until you see cardiology

## 2017-07-03 ENCOUNTER — Telehealth: Payer: Self-pay | Admitting: Family Medicine

## 2017-07-03 LAB — CBC
HCT: 41.5 % (ref 36.0–46.0)
HEMOGLOBIN: 13.9 g/dL (ref 12.0–15.0)
MCHC: 33.5 g/dL (ref 30.0–36.0)
MCV: 91.2 fl (ref 78.0–100.0)
PLATELETS: 267 10*3/uL (ref 150.0–400.0)
RBC: 4.55 Mil/uL (ref 3.87–5.11)
RDW: 13.6 % (ref 11.5–15.5)
WBC: 11.1 10*3/uL — ABNORMAL HIGH (ref 4.0–10.5)

## 2017-07-03 LAB — BASIC METABOLIC PANEL
BUN: 18 mg/dL (ref 6–23)
CALCIUM: 10.1 mg/dL (ref 8.4–10.5)
CO2: 27 meq/L (ref 19–32)
Chloride: 101 mEq/L (ref 96–112)
Creatinine, Ser: 0.8 mg/dL (ref 0.40–1.20)
GFR: 76.89 mL/min (ref >60.00)
Glucose, Bld: 96 mg/dL (ref 70–99)
POTASSIUM: 3.8 meq/L (ref 3.5–5.1)
SODIUM: 138 meq/L (ref 135–145)

## 2017-07-03 LAB — TSH: TSH: 0.12 u[IU]/mL — ABNORMAL LOW (ref 0.35–4.50)

## 2017-07-03 NOTE — Telephone Encounter (Signed)
Copied from [place]. Topic: Quick Communication - See Telephone Encounter >> Jul 03, 2017  4:10 PM Ether Griffins B wrote: CRM for notification. See Telephone encounter for:  Pt calling about lab results 07/02/17  07/03/17.

## 2017-07-03 NOTE — Telephone Encounter (Signed)
Please advise 

## 2017-07-06 DIAGNOSIS — F438 Other reactions to severe stress: Secondary | ICD-10-CM | POA: Diagnosis not present

## 2017-07-06 DIAGNOSIS — F419 Anxiety disorder, unspecified: Secondary | ICD-10-CM | POA: Diagnosis not present

## 2017-07-06 NOTE — Telephone Encounter (Signed)
Called pt. and advised of Dr. Ansel Bong review of labs and notes re: her recent lab work.  See result note.

## 2017-07-06 NOTE — Telephone Encounter (Signed)
Copied from [place] [id number]. Topic: Quick Communication - Lab Results >> Jul 06, 2017 10:30 AM [place], [place], [place] wrote: Called patient to inform them of 07/04/2017 lab results. When patient returns call, triage nurse may disclose results.  Please schedule patient with Dr. Regis Bill this week >> Jul 06, 2017 10:34 AM Valla Leaver wrote: Patient returning Dulcy Fanny call. She will be at an appt between 11 and 12 today and needs labs results disclosed.

## 2017-07-06 NOTE — Telephone Encounter (Signed)
See note

## 2017-07-08 ENCOUNTER — Encounter: Payer: Self-pay | Admitting: Family Medicine

## 2017-07-09 ENCOUNTER — Encounter: Payer: Self-pay | Admitting: Family Medicine

## 2017-07-10 ENCOUNTER — Encounter: Payer: Self-pay | Admitting: Internal Medicine

## 2017-07-10 ENCOUNTER — Ambulatory Visit (INDEPENDENT_AMBULATORY_CARE_PROVIDER_SITE_OTHER): Payer: BC Managed Care – PPO | Admitting: Internal Medicine

## 2017-07-10 VITALS — BP 158/78 | HR 96 | Ht 66.0 in | Wt 203.0 lb

## 2017-07-10 DIAGNOSIS — R0789 Other chest pain: Secondary | ICD-10-CM

## 2017-07-10 DIAGNOSIS — R002 Palpitations: Secondary | ICD-10-CM | POA: Diagnosis not present

## 2017-07-10 DIAGNOSIS — I253 Aneurysm of heart: Secondary | ICD-10-CM | POA: Diagnosis not present

## 2017-07-10 DIAGNOSIS — Q211 Atrial septal defect: Secondary | ICD-10-CM

## 2017-07-10 DIAGNOSIS — I10 Essential (primary) hypertension: Secondary | ICD-10-CM

## 2017-07-10 DIAGNOSIS — Q2112 Patent foramen ovale: Secondary | ICD-10-CM

## 2017-07-10 NOTE — Patient Instructions (Addendum)

## 2017-07-10 NOTE — Progress Notes (Signed)
Cardiology Office Note   Date:  07/10/2017   ID:  [name], DOB Dec 03, 1955, MRN [id number]  PCP:  Frances Medin, MD  Cardiologist:   Dorris Carnes, MD   Pt referred by Dr [name]  for CP     History of Present Illness: [name] is a 62 y.o. female with a history of HTN, HL obesity, palpitations and CP   Pt seen by Dr [name] on 2/21 for sharp L upper chest pain  3/10 in intensity    Also had some palpitations   Pat was on the sofa at the time   Felt heart flip/flop EMS called  BP was up   Pt says that just prior to this she had started exercising    Went to Dr  [name]   Thryoid off   Overtreated    Since then pt has had no further spells  Breathing is OK   Notes occasoinal palpitations  No dizziness  Pt brings in echo report Says possible PFO  Concerned  BP at home 110 to 130    Current Meds  Medication Sig  . Ascorbic Acid (VITAMIN C) 1000 MG tablet Take 1,000 mg by mouth daily.  . Cholecalciferol (VITAMIN D3) 10000 UNITS capsule Take 10,000 Units by mouth daily.  Marland Kitchen DHEA 10 MG TABS Take 1 tablet by mouth daily.   . hydrochlorothiazide (HYDRODIURIL) 25 MG tablet TAKE 1 TABLET BY MOUTH EVERY DAY OR AS DIRECTED  . KLOR-CON M20 20 MEQ tablet TAKE 1 TABLET BY MOUTH EVERY DAY  . NATURE-THROID 97.5 MG TABS Take 1 tablet by mouth daily.   . Progesterone Micronized (PROGESTERONE PO) Take 150 mg by mouth daily.   . Turmeric 500 MG CAPS Take 2 capsules by mouth 2 (two) times daily.  Marland Kitchen VITAMIN K PO Take 400 mcg by mouth daily.     Allergies:   Norvasc [amlodipine besylate]; Cefdinir; Penicillins; and Tylenol [acetaminophen]   Past Medical History:  Diagnosis Date  . Anal fissure   . Atrial septal aneurysm / if pfo  echo 6 13  10/23/2011  . Cataract    both eyes  . Depression   . Fatty liver    "pre fatty liver"  . Fibromyalgia   . Headache(784.0)    hx of migraines when younger  . Heart palpitations    hx with normal holter event monitoring  . Hx: UTI  (urinary tract infection)   . Hyperlipidemia   . Hypertension   . Hypothyroidism   . Obesity   . Pneumonia 1972   hx of  . Polyp of colon   . 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 Skin cancer    basal, squamous cell    Past Surgical History:  Procedure Laterality Date  . COLONOSCOPY    . DILATION AND CURETTAGE OF UTERUS  1978  . [place]  . KNEE ARTHROSCOPY     both in past  . POLYPECTOMY    . SHOULDER OPEN ROTATOR CUFF REPAIR Left 07/07/2013   Procedure: LEFT SHOULDER MINI OPEN SUBACROMIAL DECOMPRESSION ROTATOR CUFF REPAIR AND POSSIBLE PATCH GRAFT ;  Surgeon: Johnn Hai, MD;  Location: [facility];  Service: Orthopedics;  Laterality: Left;  with interscaline block  . SKIN CANCER EXCISION Bilateral    arm, legs, and chest  . TONSILLECTOMY       Social History:  The patient  reports that  has never smoked. she has never used smokeless tobacco. She reports  that she drinks alcohol. She reports that she does not use drugs.   Family History:  The patient's family history includes ADD / ADHD in her child; Cancer in her sister; Hyperlipidemia in her father and unknown relative; Hypertension in her father and mother; Juvenile Diabetes in her daughter; Liver disease in her father; Melanoma in her sister; Osteoporosis in her mother.    ROS:  Please see the history of present illness. All other systems are reviewed and  Negative to the above problem except as noted.    PHYSICAL EXAM: VS:  BP (!) 158/78   Pulse 96   Ht 5\' 6"  (1.676 m)   Wt 203 lb (92.1 kg)   SpO2 98%   BMI 32.77 kg/m   GEN: Well nourished, well developed, in no acute distress  HEENT: normal  Neck: no JVD, carotid bruits, or masses Cardiac: RRR; no murmurs, rubs, or gallops,no edema  Respiratory:  clear to auscultation bilaterally, normal work of breathing GI: soft, nontender, nondistended, + BS  No hepatomegaly  MS: no deformity Moving all extremities   Skin: warm and dry, no rash Neuro:   Strength and sensation are intact Psych: euthymic mood, full affect   EKG:  EKG is not  ordered today.On 2/21 SR 81 bpm  LAD   Lipid Panel    Component Value Date/Time   CHOL 244 (H) 07/23/2016 0853   TRIG 60.0 07/23/2016 0853   HDL 62.20 07/23/2016 0853   CHOLHDL 4 07/23/2016 0853   VLDL 12.0 07/23/2016 0853   LDLCALC 169 (H) 07/23/2016 0853   LDLDIRECT 165.2 12/27/2012 1355      Wt Readings from Last 3 Encounters:  07/10/17 203 lb (92.1 kg)  07/02/17 201 lb (91.2 kg)  01/26/17 201 lb (91.2 kg)      ASSESSMENT AND PLAN:  1  Chest pain  Atypical for cardiac  Probable musculoskeletal  2  HTN  BP is up today  She says it is better at home  Needs to be followed  3  Abnormal echo   Echo in past with possible PFO   Will set for f/u with bubble study to confirm  Reviewed anatomy with pt   Reassured her   4.  HCM   LDL 169  Needs to work on diet   Follow   Current medicines are reviewed at length with the patient today.  The patient does not have concerns regarding medicines.  Signed, Dorris Carnes, MD  07/10/2017 3:35 PM    [facility] [place], [place], [place]  [address] Phone: [phone]; Fax: [phone]

## 2017-07-13 ENCOUNTER — Encounter: Payer: Self-pay | Admitting: Internal Medicine

## 2017-07-13 DIAGNOSIS — F419 Anxiety disorder, unspecified: Secondary | ICD-10-CM | POA: Diagnosis not present

## 2017-07-13 DIAGNOSIS — F438 Other reactions to severe stress: Secondary | ICD-10-CM | POA: Diagnosis not present

## 2017-07-20 ENCOUNTER — Ambulatory Visit (HOSPITAL_COMMUNITY): Payer: BC Managed Care – PPO | Attending: Cardiovascular Disease

## 2017-07-20 ENCOUNTER — Other Ambulatory Visit: Payer: Self-pay

## 2017-07-20 DIAGNOSIS — I10 Essential (primary) hypertension: Secondary | ICD-10-CM | POA: Insufficient documentation

## 2017-07-20 DIAGNOSIS — I253 Aneurysm of heart: Secondary | ICD-10-CM | POA: Diagnosis not present

## 2017-07-20 DIAGNOSIS — Q2112 Patent foramen ovale: Secondary | ICD-10-CM

## 2017-07-20 HISTORY — DX: Patent foramen ovale: Q21.12

## 2017-07-21 DIAGNOSIS — F438 Other reactions to severe stress: Secondary | ICD-10-CM | POA: Diagnosis not present

## 2017-07-21 DIAGNOSIS — F419 Anxiety disorder, unspecified: Secondary | ICD-10-CM | POA: Diagnosis not present

## 2017-07-22 DIAGNOSIS — E049 Nontoxic goiter, unspecified: Secondary | ICD-10-CM | POA: Diagnosis not present

## 2017-07-22 DIAGNOSIS — K7210 Chronic hepatic failure without coma: Secondary | ICD-10-CM | POA: Diagnosis not present

## 2017-07-22 DIAGNOSIS — R5381 Other malaise: Secondary | ICD-10-CM | POA: Diagnosis not present

## 2017-07-27 DIAGNOSIS — F419 Anxiety disorder, unspecified: Secondary | ICD-10-CM | POA: Diagnosis not present

## 2017-07-27 DIAGNOSIS — F438 Other reactions to severe stress: Secondary | ICD-10-CM | POA: Diagnosis not present

## 2017-07-31 ENCOUNTER — Ambulatory Visit: Payer: BC Managed Care – PPO | Admitting: Internal Medicine

## 2017-07-31 ENCOUNTER — Encounter: Payer: Self-pay | Admitting: Internal Medicine

## 2017-07-31 VITALS — BP 158/82 | HR 83 | Temp 98.1°F | Wt 201.8 lb

## 2017-07-31 DIAGNOSIS — Z8249 Family history of ischemic heart disease and other diseases of the circulatory system: Secondary | ICD-10-CM | POA: Diagnosis not present

## 2017-07-31 DIAGNOSIS — R0989 Other specified symptoms and signs involving the circulatory and respiratory systems: Secondary | ICD-10-CM

## 2017-07-31 DIAGNOSIS — Z79899 Other long term (current) drug therapy: Secondary | ICD-10-CM

## 2017-07-31 DIAGNOSIS — R002 Palpitations: Secondary | ICD-10-CM | POA: Diagnosis not present

## 2017-07-31 DIAGNOSIS — E039 Hypothyroidism, unspecified: Secondary | ICD-10-CM

## 2017-07-31 DIAGNOSIS — F419 Anxiety disorder, unspecified: Secondary | ICD-10-CM | POA: Diagnosis not present

## 2017-07-31 MED ORDER — CARVEDILOL 6.25 MG PO TABS: 6.2500 mg | ORAL_TABLET | Freq: Two times a day (BID) | ORAL | 1 refills | Status: DC

## 2017-07-31 NOTE — Progress Notes (Signed)
Chief Complaint  Patient presents with  . Hypertension    Pt still having elevated BP with dizziness. Range 150s/90s down to 120s/70s. Pt brought BP machine to visit and range was still in 150s when compared to being manually checked.     HPI: [name] 62 y.o. come in for  SDA   Concern about her bp  again and dizzy at times   See prev notes   Recheck    Has seen dr Harrington Challenger    Echo ok very small pfo not felt to be clinically important   Heading to pt and had feeling bad      Dizzy and Bp scared .    When this happens .  Now taking Very few  Supplements    Mag and k .     Less  thyoid   Med   To check today on meds .  May have had ocass flip flop remote hx of  Monitor   Son had  Hr at some point of 200 + and required a procedure extra electrical pathway  Doing fine now Seeing counselor now  For anxiety Is doing ok trying to lose weight  And exercise  Still problematic  ROS: See pertinent positives and negatives per HPI.  Past Medical History:  Diagnosis Date  . Anal fissure   . Atrial septal aneurysm / if pfo  echo 6 13  10/23/2011  . Cataract    both eyes  . Depression   . Fatty liver    "pre fatty liver"  . Fibromyalgia   . Headache(784.0)    hx of migraines when younger  . Heart palpitations    hx with normal holter event monitoring  . Hx: UTI (urinary tract infection)   . Hyperlipidemia   . Hypertension   . Hypothyroidism   . Obesity   . Pneumonia 1972   hx of  . Polyp of colon   . 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 Skin cancer    basal, squamous cell    Family History  Problem Relation Age of Onset  . Hypertension Mother        low borderline  . Osteoporosis Mother   . Hypertension Father   . Liver disease Father        amyloid deceased  . Hyperlipidemia Father   . Melanoma Sister   . Cancer Sister   . Juvenile Diabetes Daughter   . ADD / ADHD Child   . Hyperlipidemia Unknown        Maternal grandmother  . Colon cancer Neg Hx   . Stomach  cancer Neg Hx   . Colon polyps Neg Hx   . Esophageal cancer Neg Hx   . Rectal cancer Neg Hx     Social History   Socioeconomic History  . Marital status: Married    Spouse name: Not on file  . Number of children: 6  . Years of education: Not on file  . Highest education level: Not on file  Occupational History  . Occupation: TEACHER ASST    Employer: [employer]  Social Needs  . Financial resource strain: Not on file  . Food insecurity:    Worry: Not on file    Inability: Not on file  . Transportation needs:    Medical: Not on file    Non-medical: Not on file  Tobacco Use  . Smoking status: Never Smoker  . Smokeless tobacco: Never Used  Substance and Sexual  Activity  . Alcohol use: Yes    Comment: rare  . Drug use: No  . Sexual activity: Not on file  Lifestyle  . Physical activity:    Days per week: Not on file    Minutes per session: Not on file  . Stress: Not on file  Relationships  . Social connections:    Talks on phone: Not on file    Gets together: Not on file    Attends religious service: Not on file    Active member of club or organization: Not on file    Attends meetings of clubs or organizations: Not on file    Relationship status: Not on file  Other Topics Concern  . Not on file  Social History Narrative   Teachers [employer]  Not workking since last February . 15 left shoulder surgery    Married   Special educ   HH of 4   3 outside dogs, 8 goats, 40 chickens and quail. [place]   Job stresses   Ex husband passes away   Not working after injur at school  Shoulder neck                 Outpatient Medications Prior to Visit  Medication Sig Dispense Refill  . Ascorbic Acid (VITAMIN C) 1000 MG tablet Take 1,000 mg by mouth daily.    . Cholecalciferol (VITAMIN D3) 10000 UNITS capsule Take 10,000 Units by mouth daily.    Marland Kitchen DHEA 10 MG TABS Take 1 tablet by mouth daily.     . hydrochlorothiazide (HYDRODIURIL)  25 MG tablet TAKE 1 TABLET BY MOUTH EVERY DAY OR AS DIRECTED 90 tablet 1  . KLOR-CON M20 20 MEQ tablet TAKE 1 TABLET BY MOUTH EVERY DAY 30 tablet 5  . NATURE-THROID 97.5 MG TABS Take 1 tablet by mouth daily.     . Progesterone Micronized (PROGESTERONE PO) Take 150 mg by mouth daily.     . Turmeric 500 MG CAPS Take 2 capsules by mouth 2 (two) times daily.    Marland Kitchen VITAMIN K PO Take 400 mcg by mouth daily.     No facility-administered medications prior to visit.      EXAM:  BP (!) 158/82 (BP Location: Left Arm, Patient Position: Sitting, Cuff Size: Normal)   Pulse 83   Temp 98.1 F (36.7 C) (Oral)   Wt 201 lb 12.8 oz (91.5 kg)   BMI 32.57 kg/m   Body mass index is 32.57 kg/m.  GENERAL: vitals reviewed and listed above, alert, oriented, appears well hydrated and in no acute distress HEENT: atraumatic, conjunctiva  clear, no obvious abnormalities on inspection of external nose and ears   NECK: no obvious masses on inspection palpation  LUNGS: clear to auscultation bilaterally, no wheezes, rales or rhonchi, good air movement CV: HRRR, no clubbing cyanosis or  peripheral edema nl cap refill  MS: moves all extremities without noticeable focal  abnormality PSYCH: pleasant and cooperative, no obvious depression or anxiety Lab Results  Component Value Date   WBC 11.1 (H) 07/02/2017   HGB 13.9 07/02/2017   HCT 41.5 07/02/2017   PLT 267.0 07/02/2017   GLUCOSE 96 07/02/2017   CHOL 244 (H) 07/23/2016   TRIG 60.0 07/23/2016   HDL 62.20 07/23/2016   LDLDIRECT 165.2 12/27/2012   LDLCALC 169 (H) 07/23/2016   ALT 31 01/09/2017   AST 22 01/09/2017   NA 138 07/02/2017   K 3.8 07/02/2017   CL  101 07/02/2017   CREATININE 0.80 07/02/2017   BUN 18 07/02/2017   CO2 27 07/02/2017   TSH 0.12 (L) 07/02/2017   HGBA1C 5.2 06/01/2013   BP Readings from Last 3 Encounters:  07/31/17 (!) 158/82  07/10/17 (!) 158/78  07/02/17 132/82    ASSESSMENT AND PLAN:  Discussed the following assessment  and plan:  Labile hypertension  Medication management  Intermittent palpitations  Anxiety  Family history of arrhythmia - tachy cardia  son had procedure   Hypothyroidism, unspecified type - oversuppressed recnetly but  adjusted  now consider adding  back a med   4 sartans  Are  On recall  And she  prefers  A different type med   if needed trial carvedilol   And then fu   Agree that anxiety can add to sx .  If rapid heart rate consider  Event monitor etc   With fam hx  Of tachy arrythmia  -Patient advised to return or notify health care team  if  new concerns arise. Total visit 21mins > 50% spent counseling and coordinating care as indicated in above note and in instructions to patient .  cautioned about  Avoiding supplements if possible Patient Instructions  Continue lifestyle intervention healthy eating and exercise . Get the thyroid in range and  If  BP still not in range we can add low dose of carvedilol    Anxiety can   elevated bp  From stress   And over active thyroid  Also.  Let me know if you start the medication   And then fu for BP   2-3 months   consider  Heart monitor if getting   ...    Fast heart rates a lot.              Standley Brooking. Manisha Cancel M.D.

## 2017-07-31 NOTE — Patient Instructions (Addendum)
Continue lifestyle intervention healthy eating and exercise . Get the thyroid in range and  If  BP still not in range we can add low dose of carvedilol    Anxiety can   elevated bp  From stress   And over active thyroid  Also.  Let me know if you start the medication   And then fu for BP   2-3 months   consider  Heart monitor if getting   ...    Fast heart rates a lot.

## 2017-08-04 DIAGNOSIS — F438 Other reactions to severe stress: Secondary | ICD-10-CM | POA: Diagnosis not present

## 2017-08-04 DIAGNOSIS — E039 Hypothyroidism, unspecified: Secondary | ICD-10-CM | POA: Diagnosis not present

## 2017-08-04 DIAGNOSIS — F419 Anxiety disorder, unspecified: Secondary | ICD-10-CM | POA: Diagnosis not present

## 2017-08-04 DIAGNOSIS — J309 Allergic rhinitis, unspecified: Secondary | ICD-10-CM | POA: Diagnosis not present

## 2017-08-04 DIAGNOSIS — M797 Fibromyalgia: Secondary | ICD-10-CM | POA: Diagnosis not present

## 2017-08-04 DIAGNOSIS — R5383 Other fatigue: Secondary | ICD-10-CM | POA: Diagnosis not present

## 2017-08-10 DIAGNOSIS — F419 Anxiety disorder, unspecified: Secondary | ICD-10-CM | POA: Diagnosis not present

## 2017-08-10 DIAGNOSIS — F438 Other reactions to severe stress: Secondary | ICD-10-CM | POA: Diagnosis not present

## 2017-08-18 DIAGNOSIS — F438 Other reactions to severe stress: Secondary | ICD-10-CM | POA: Diagnosis not present

## 2017-08-18 DIAGNOSIS — F419 Anxiety disorder, unspecified: Secondary | ICD-10-CM | POA: Diagnosis not present

## 2017-08-25 DIAGNOSIS — F419 Anxiety disorder, unspecified: Secondary | ICD-10-CM | POA: Diagnosis not present

## 2017-08-25 DIAGNOSIS — F438 Other reactions to severe stress: Secondary | ICD-10-CM | POA: Diagnosis not present

## 2017-08-26 ENCOUNTER — Other Ambulatory Visit: Payer: Self-pay | Admitting: Internal Medicine

## 2017-08-31 DIAGNOSIS — F438 Other reactions to severe stress: Secondary | ICD-10-CM | POA: Diagnosis not present

## 2017-08-31 DIAGNOSIS — F419 Anxiety disorder, unspecified: Secondary | ICD-10-CM | POA: Diagnosis not present

## 2017-09-01 NOTE — Progress Notes (Signed)
Chief Complaint  Patient presents with  . Cough    x3 weeks, tightness in the chest, head and chest congestion, sore throat, drainage, runny nose, dry cough, took guaifenesin 12 hour, some help,     HPI: [name] 62 y.o.   sda    Dry cough runny pnd  Worse ? After working outside   No itching  Throat clearing cough on going worse at night   Laying down No sob     hsa tried flonase for 3 days  So far no help  bp better after dec of thyroid medication.  usua 120 118  ocass 130 140   Going to lose insurance at end of month  Ltd gone  ROS: See pertinent positives and negatives per HPI.  Past Medical History:  Diagnosis Date  . Anal fissure   . Atrial septal aneurysm / if pfo  echo 6 13  10/23/2011  . Cataract    both eyes  . Depression   . Fatty liver    "pre fatty liver"  . Fibromyalgia   . Headache(784.0)    hx of migraines when younger  . Heart palpitations    hx with normal holter event monitoring  . Hx: UTI (urinary tract infection)   . Hyperlipidemia   . Hypertension   . Hypothyroidism   . Obesity   . Pneumonia 1972   hx of  . Polyp of colon   . 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 Skin cancer    basal, squamous cell    Family History  Problem Relation Age of Onset  . Hypertension Mother        low borderline  . Osteoporosis Mother   . Hypertension Father   . Liver disease Father        amyloid deceased  . Hyperlipidemia Father   . Melanoma Sister   . Cancer Sister   . Juvenile Diabetes Daughter   . ADD / ADHD Child   . Hyperlipidemia Unknown        Maternal grandmother  . Colon cancer Neg Hx   . Stomach cancer Neg Hx   . Colon polyps Neg Hx   . Esophageal cancer Neg Hx   . Rectal cancer Neg Hx     Social History   Socioeconomic History  . Marital status: Married    Spouse name: Not on file  . Number of children: 6  . Years of education: Not on file  . Highest education level: Not on file  Occupational History  . Occupation:  TEACHER ASST    Employer: [employer]  Social Needs  . Financial resource strain: Not on file  . Food insecurity:    Worry: Not on file    Inability: Not on file  . Transportation needs:    Medical: Not on file    Non-medical: Not on file  Tobacco Use  . Smoking status: Never Smoker  . Smokeless tobacco: Never Used  Substance and Sexual Activity  . Alcohol use: Yes    Comment: rare  . Drug use: No  . Sexual activity: Not on file  Lifestyle  . Physical activity:    Days per week: Not on file    Minutes per session: Not on file  . Stress: Not on file  Relationships  . Social connections:    Talks on phone: Not on file    Gets together: Not on file    Attends religious service: Not on file  Active member of club or organization: Not on file    Attends meetings of clubs or organizations: Not on file    Relationship status: Not on file  Other Topics Concern  . Not on file  Social History Narrative   [employer]  Not workking since last February . 13 left shoulder surgery    Married   Special educ   HH of 4   3 outside dogs, 8 goats, 40 chickens and quail. [place]   [place]   Ex husband passes away   Not working after injur at school  Shoulder neck                 Outpatient Medications Prior to Visit  Medication Sig Dispense Refill  . Ascorbic Acid (VITAMIN C) 1000 MG tablet Take 1,000 mg by mouth daily.    . Cholecalciferol (VITAMIN D3) 10000 UNITS capsule Take 10,000 Units by mouth daily.    Marland Kitchen DHEA 10 MG TABS Take 1 tablet by mouth daily.     . hydrochlorothiazide (HYDRODIURIL) 25 MG tablet TAKE 1 TABLET BY MOUTH EVERY DAY OR AS DIRECTED 90 tablet 1  . KLOR-CON M20 20 MEQ tablet TAKE 1 TABLET BY MOUTH EVERY DAY 30 tablet 5  . MAGNESIUM CARBONATE PO Take 1 tablet by mouth daily.    Marland Kitchen NATURE-THROID 97.5 MG TABS Take 1 tablet on Sunday, Tuesday, Thursday and Saturday. Take 1/2 tablet all other days, M, W, F.    .  Progesterone Micronized (PROGESTERONE PO) Take 150 mg by mouth daily.     Marland Kitchen UNABLE TO FIND Med Name: Ultra Hepa Trope II -- Take 1 daily    . VITAMIN K PO Take 400 mcg by mouth daily.    . carvedilol (COREG) 6.25 MG tablet Take 1 tablet (6.25 mg total) by mouth 2 (two) times daily with a meal. (Patient not taking: Reported on 09/03/2017) 60 tablet 1  . KLOR-CON M20 20 MEQ tablet TAKE 1 TABLET BY MOUTH EVERY DAY (Patient not taking: Reported on 09/03/2017) 90 tablet 0  . Turmeric 500 MG CAPS Take 2 capsules by mouth 2 (two) times daily.     No facility-administered medications prior to visit.      EXAM:  BP 138/80 (BP Location: Left Arm, Patient Position: Sitting, Cuff Size: Normal)   Pulse 83   Temp 98.4 F (36.9 C) (Oral)   Wt 200 lb (90.7 kg)   BMI 32.28 kg/m   Body mass index is 32.28 kg/m.  GENERAL: vitals reviewed and listed above, alert, oriented, appears well hydrated and in no acute distress ocass throat clearing  Looks well today  HEENT: atraumatic, conjunctiva  clear, no obvious abnormalities on inspection of external nose and ears OP : no lesion edema or exudate  Nares min congestion face non tender  NECK: no obvious masses on inspection palpation  LUNGS: clear to auscultation bilaterally, no wheezes, rales or rhonchi, good air movement CV: HRRR, no clubbing cyanosis or  peripheral edema nl cap refill  MS: moves all extremities without noticeable focal  abnormality PSYCH: pleasant and cooperative, no obvious depression or anxiety bp log review  ASSESSMENT AND PLAN:  Discussed the following assessment and plan:  Persistent cough  Labile hypertension - improved with adjsutement of   thyroi med   Medication management Suspect combo of environmental factors and poss reflux       rx for both  See  Instructions  Exam reassuring  and  Fu if  persistent or progressive cons c xray  Other rx    Expectant management. -Patient advised to return or notify health care team  if  symptoms worsen ,persist or new concerns arise.  Patient Instructions  Anti reflux   Parameters .    No food / Pills  Before bed reclining .   Can also try  Add on ranitidine 150 mg pr pepcid  20 at night incase reflux adding to problem .  flonase for at least  10 days  And then ongoing if helps .      Continue   Can try  Antihistamine  Generic claritin  Allegra or zyrtec plain .   Also    If not improving in the next 2 weeks contact us for further  Review.     Standley Brooking. Panosh M.D.

## 2017-09-03 ENCOUNTER — Encounter: Payer: Self-pay | Admitting: Internal Medicine

## 2017-09-03 ENCOUNTER — Ambulatory Visit: Payer: BC Managed Care – PPO | Admitting: Internal Medicine

## 2017-09-03 VITALS — BP 138/80 | HR 83 | Temp 98.4°F | Wt 200.0 lb

## 2017-09-03 DIAGNOSIS — R053 Chronic cough: Secondary | ICD-10-CM

## 2017-09-03 DIAGNOSIS — E039 Hypothyroidism, unspecified: Secondary | ICD-10-CM | POA: Diagnosis not present

## 2017-09-03 DIAGNOSIS — R05 Cough: Secondary | ICD-10-CM

## 2017-09-03 DIAGNOSIS — R0989 Other specified symptoms and signs involving the circulatory and respiratory systems: Secondary | ICD-10-CM | POA: Diagnosis not present

## 2017-09-03 DIAGNOSIS — Z79899 Other long term (current) drug therapy: Secondary | ICD-10-CM | POA: Diagnosis not present

## 2017-09-03 DIAGNOSIS — R5383 Other fatigue: Secondary | ICD-10-CM | POA: Diagnosis not present

## 2017-09-03 NOTE — Patient Instructions (Signed)
Anti reflux   Parameters .    No food / Pills  Before bed reclining .   Can also try  Add on ranitidine 150 mg pr pepcid  20 at night incase reflux adding to problem .  flonase for at least  10 days  And then ongoing if helps .      Continue   Can try  Antihistamine  Generic claritin  Allegra or zyrtec plain .   Also    If not improving in the next 2 weeks contact us for further  Review.

## 2017-09-08 DIAGNOSIS — F438 Other reactions to severe stress: Secondary | ICD-10-CM | POA: Diagnosis not present

## 2017-09-08 DIAGNOSIS — F419 Anxiety disorder, unspecified: Secondary | ICD-10-CM | POA: Diagnosis not present

## 2017-10-07 ENCOUNTER — Encounter: Payer: Self-pay | Admitting: Internal Medicine

## 2017-10-08 MED ORDER — POTASSIUM CHLORIDE CRYS ER 20 MEQ PO TBCR: 20.0000 meq | EXTENDED_RELEASE_TABLET | Freq: Every day | ORAL | 5 refills | Status: DC

## 2017-10-08 NOTE — Telephone Encounter (Signed)
Rx placed in red folder to be signed ASAP  Pt is coming to pick up 10/09/17  Pt due for OV for BP check. Will be made aware at time of pick up of Rx -- appt will need to be made.   Please advise Dr Regis Bill, thanks.

## 2017-10-09 ENCOUNTER — Ambulatory Visit: Payer: Self-pay

## 2017-10-09 NOTE — Telephone Encounter (Signed)
Per Dr. Regis Bill, pt needs office visit to follow up on blood pressure. I spoke with pt and she agreed.

## 2017-10-09 NOTE — Telephone Encounter (Signed)
Rx placed up front for patient to pick up. Nothing further needed.

## 2017-10-09 NOTE — Telephone Encounter (Signed)
rx signed

## 2017-10-26 ENCOUNTER — Telehealth: Payer: Self-pay | Admitting: Family Medicine

## 2017-10-26 NOTE — Telephone Encounter (Signed)
Patient dropped off a log of blood pressure readings, copy put in green folder for review.

## 2017-10-29 NOTE — Telephone Encounter (Signed)
reviewed readings  Most in range  Will hold until she comes for next visit  On June 27th

## 2017-11-04 DIAGNOSIS — M797 Fibromyalgia: Secondary | ICD-10-CM | POA: Diagnosis not present

## 2017-11-04 DIAGNOSIS — J309 Allergic rhinitis, unspecified: Secondary | ICD-10-CM | POA: Diagnosis not present

## 2017-11-04 DIAGNOSIS — E039 Hypothyroidism, unspecified: Secondary | ICD-10-CM | POA: Diagnosis not present

## 2017-11-04 DIAGNOSIS — R5383 Other fatigue: Secondary | ICD-10-CM | POA: Diagnosis not present

## 2017-11-05 ENCOUNTER — Ambulatory Visit (INDEPENDENT_AMBULATORY_CARE_PROVIDER_SITE_OTHER): Payer: BLUE CROSS/BLUE SHIELD | Admitting: Internal Medicine

## 2017-11-05 ENCOUNTER — Encounter: Payer: Self-pay | Admitting: Internal Medicine

## 2017-11-05 VITALS — BP 136/82 | HR 88 | Temp 98.3°F | Ht 66.0 in | Wt 198.2 lb

## 2017-11-05 DIAGNOSIS — Z79899 Other long term (current) drug therapy: Secondary | ICD-10-CM

## 2017-11-05 DIAGNOSIS — E039 Hypothyroidism, unspecified: Secondary | ICD-10-CM

## 2017-11-05 DIAGNOSIS — I10 Essential (primary) hypertension: Secondary | ICD-10-CM | POA: Diagnosis not present

## 2017-11-05 NOTE — Progress Notes (Signed)
Chief Complaint  Patient presents with  . Blood Pressure Check    BP f/u    HPI: [name] 62 y.o. come in for Chronic disease management  BP  Now on  Whole   hctz and potassium  Only for bp  And  In range   White coat    bp 168   Readings and went down 34 points.  ses nutrition and thyroid doc and adjsuted  meds  And doing ok trying to lose more weight  ROS: See pertinent positives and negatives per HPI. No cv sx  checkin bp less often   Past Medical History:  Diagnosis Date  . Anal fissure   . Atrial septal aneurysm / if pfo  echo 6 13  10/23/2011  . Cataract    both eyes  . Depression   . Fatty liver    "pre fatty liver"  . Fibromyalgia   . Headache(784.0)    hx of migraines when younger  . Heart palpitations    hx with normal holter event monitoring  . Hx: UTI (urinary tract infection)   . Hyperlipidemia   . Hypertension   . Hypothyroidism   . Obesity   . Pneumonia 1972   hx of  . Polyp of colon   . 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 Skin cancer    basal, squamous cell    Family History  Problem Relation Age of Onset  . Hypertension Mother        low borderline  . Osteoporosis Mother   . Hypertension Father   . Liver disease Father        amyloid deceased  . Hyperlipidemia Father   . Melanoma Sister   . Cancer Sister   . Juvenile Diabetes Daughter   . ADD / ADHD Child   . Hyperlipidemia Unknown        Maternal grandmother  . Colon cancer Neg Hx   . Stomach cancer Neg Hx   . Colon polyps Neg Hx   . Esophageal cancer Neg Hx   . Rectal cancer Neg Hx     Social History   Socioeconomic History  . Marital status: Married    Spouse name: Not on file  . Number of children: 6  . Years of education: Not on file  . Highest education level: Not on file  Occupational History  . Occupation: TEACHER AS[employer]    Employer: [employer]  Social Needs  . Financial resource strain: Not on file  . Food insecurity:    Worry: Not on file   Inability: Not on file  . Transportation needs:    Medical: Not on file    Non-medical: Not on file  Tobacco Use  . Smoking status: Never Smoker  . Smokeless tobacco: Never Used  Substance and Sexual Activity  . Alcohol use: Yes    Comment: rare  . Drug use: No  . Sexual activity: Not on file  Lifestyle  . Physical activity:    Days per week: Not on file    Minutes per session: Not on file  . Stress: Not on file  Relationships  . Social connections:    Talks on phone: Not on file    Gets together: Not on file    Attends religious service: Not on file    Active member of club or organization: Not on file    Attends meetings of clubs or organizations: Not on file    Relationship status: Not  on file  Other Topics Concern  . Not on file  Social History Narrative   [employer]  Not workking since last February . 13 left shoulder surgery    Married   Special educ   HH of 4   3 outside dogs, 8 goats, 40 chickens and quail. [place]   Job stresses   Ex husband passes away   Not working after injur at school  Shoulder neck                 Outpatient Medications Prior to Visit  Medication Sig Dispense Refill  . Ascorbic Acid (VITAMIN C) 1000 MG tablet Take 1,000 mg by mouth daily.    . Cholecalciferol (VITAMIN D3) 10000 UNITS capsule Take 10,000 Units by mouth daily.    Marland Kitchen DHEA 10 MG TABS Take 1 tablet by mouth daily.     . hydrochlorothiazide (HYDRODIURIL) 25 MG tablet TAKE 1 TABLET BY MOUTH EVERY DAY OR AS DIRECTED 90 tablet 1  . MAGNESIUM CARBONATE PO Take 1 tablet by mouth daily.    Marland Kitchen NATURE-THROID 97.5 MG TABS Take 1 tablet on Sunday, Tuesday, Thursday and Saturday. Take 1/2 tablet all other days, M, W, F.    . potassium chloride SA (KLOR-CON M20) 20 MEQ tablet Take 1 tablet (20 mEq total) by mouth daily. 30 tablet 5  . Progesterone Micronized (PROGESTERONE PO) Take 150 mg by mouth daily.     Marland Kitchen UNABLE TO FIND Med Name: Ultra Hepa  Trope II -- Take 1 daily    . VITAMIN K PO Take 400 mcg by mouth daily.     No facility-administered medications prior to visit.      EXAM:  BP 136/82 (BP Location: Left Arm, Patient Position: Sitting, Cuff Size: Large)   Pulse 88   Temp 98.3 F (36.8 C) (Oral)   Ht 5\' 6"  (1.676 m)   Wt 198 lb 4 oz (89.9 kg)   SpO2 97%   BMI 32.00 kg/m   Body mass index is 32 kg/m.  GENERAL: vitals reviewed and listed above, alert, oriented, appears well hydrated and in no acute distress PSYCH: pleasant and cooperative, no obvious depression or anxiety Lab Results  Component Value Date   WBC 11.1 (H) 07/02/2017   HGB 13.9 07/02/2017   HCT 41.5 07/02/2017   PLT 267.0 07/02/2017   GLUCOSE 96 07/02/2017   CHOL 244 (H) 07/23/2016   TRIG 60.0 07/23/2016   HDL 62.20 07/23/2016   LDLDIRECT 165.2 12/27/2012   LDLCALC 169 (H) 07/23/2016   ALT 31 01/09/2017   AST 22 01/09/2017   NA 138 07/02/2017   K 3.8 07/02/2017   CL 101 07/02/2017   CREATININE 0.80 07/02/2017   BUN 18 07/02/2017   CO2 27 07/02/2017   TSH 0.12 (L) 07/02/2017   HGBA1C 5.2 06/01/2013   BP Readings from Last 3 Encounters:  11/05/17 136/82  09/03/17 138/80  07/31/17 (!) 158/82   Wt Readings from Last 3 Encounters:  11/05/17 198 lb 4 oz (89.9 kg)  09/03/17 200 lb (90.7 kg)  07/31/17 201 lb 12.8 oz (91.5 kg)   The 10-year ASCVD risk score Mikey Bussing DC Jr., et al., 2013) is: 7.2%   Values used to calculate the score:     Age: 5 years     Sex: Female     Is Non-Hispanic African American: No     Diabetic: No     Tobacco smoker: No  Systolic Blood Pressure: 643 mmHg     Is BP treated: Yes     HDL Cholesterol: 62.2 mg/dL     Total Cholesterol: 244 mg/dL  ASSESSMENT AND PLAN:  Discussed the following assessment and plan:  Essential hypertension - some whtie coat effect but in control   Medication management  Hypothyroidism, unspecified type Will get repeat labs tsh    In September .     Doing .  Ok ask to do  lipid panel and BMP at that time to [place]    Has limited insurance coverage and would like to minimize cost of lab   As appropriate  Continue lifestyle intervention healthy eating and exercise . Cont  hctz at this time  Disc etc pt not intereested in statin at this time -Patient advised to return or notify health care team  if  new concerns arise.  Patient Instructions  Continue blood pressure control.  Make sure thyroid in range  Would like to have an updated LIPID panel , Kidney.  Potassium ( BMP)   Send [place] results  Continue weight loss .    Healthy  Manner.        The 10-year ASCVD risk score Mikey Bussing DC Brooke Bonito., et al., 2013) is: 7.2%   Values used to calculate the score:     Age: 51 years     Sex: Female     Is Non-Hispanic African American: No     Diabetic: No     Tobacco smoker: No     Systolic Blood Pressure: 838 mmHg     Is BP treated: Yes     HDL Cholesterol: 62.2 mg/dL     Total Cholesterol: 244 mg/dL     Standley Brooking. Lawan Nanez M.D.

## 2017-11-05 NOTE — Patient Instructions (Addendum)
Continue blood pressure control.  Make sure thyroid in range  Would like to have an updated LIPID panel , Kidney.  Potassium ( BMP)   Send [place] results  Continue weight loss .    Healthy  Manner.        The 10-year ASCVD risk score Mikey Bussing DC Brooke Bonito., et al., 2013) is: 7.2%   Values used to calculate the score:     Age: 62 years     Sex: Female     Is Non-Hispanic African American: No     Diabetic: No     Tobacco smoker: No     Systolic Blood Pressure: 468 mmHg     Is BP treated: Yes     HDL Cholesterol: 62.2 mg/dL     Total Cholesterol: 244 mg/dL

## 2017-11-11 ENCOUNTER — Other Ambulatory Visit: Payer: Self-pay | Admitting: Internal Medicine

## 2017-11-24 DIAGNOSIS — F438 Other reactions to severe stress: Secondary | ICD-10-CM | POA: Diagnosis not present

## 2017-11-24 DIAGNOSIS — F419 Anxiety disorder, unspecified: Secondary | ICD-10-CM | POA: Diagnosis not present

## 2018-01-14 LAB — HEPATIC FUNCTION PANEL
ALT: 24 (ref 7–35)
AST: 21 (ref 13–35)
Alkaline Phosphatase: 78 (ref 25–125)
BILIRUBIN, TOTAL: 0.6

## 2018-01-14 LAB — LIPID PANEL
CHOLESTEROL: 262 — AB (ref 0–200)
HDL: 73 — AB (ref 35–70)
LDL Cholesterol: 173
Triglycerides: 63 (ref 40–160)

## 2018-01-14 LAB — CBC AND DIFFERENTIAL
HCT: 39 (ref 36–46)
Hemoglobin: 13.5 (ref 12.0–16.0)
PLATELETS: 240 (ref 150–399)
WBC: 7.5

## 2018-01-14 LAB — TSH: TSH: 1.96 (ref 0.41–5.90)

## 2018-01-14 LAB — BASIC METABOLIC PANEL
Potassium: 3.6 (ref 3.4–5.3)
Sodium: 142 (ref 137–147)

## 2018-01-27 DIAGNOSIS — R5383 Other fatigue: Secondary | ICD-10-CM | POA: Diagnosis not present

## 2018-01-27 DIAGNOSIS — J309 Allergic rhinitis, unspecified: Secondary | ICD-10-CM | POA: Diagnosis not present

## 2018-01-27 DIAGNOSIS — E039 Hypothyroidism, unspecified: Secondary | ICD-10-CM | POA: Diagnosis not present

## 2018-01-27 DIAGNOSIS — M797 Fibromyalgia: Secondary | ICD-10-CM | POA: Diagnosis not present

## 2018-02-01 ENCOUNTER — Other Ambulatory Visit: Payer: Self-pay | Admitting: Obstetrics and Gynecology

## 2018-02-01 DIAGNOSIS — Z1231 Encounter for screening mammogram for malignant neoplasm of breast: Secondary | ICD-10-CM

## 2018-02-08 ENCOUNTER — Encounter: Payer: Self-pay | Admitting: Family Medicine

## 2018-02-08 ENCOUNTER — Ambulatory Visit (INDEPENDENT_AMBULATORY_CARE_PROVIDER_SITE_OTHER): Payer: BLUE CROSS/BLUE SHIELD | Admitting: Family Medicine

## 2018-02-08 VITALS — BP 120/72 | HR 91 | Temp 98.1°F | Ht 66.0 in | Wt 197.9 lb

## 2018-02-08 DIAGNOSIS — I10 Essential (primary) hypertension: Secondary | ICD-10-CM | POA: Diagnosis not present

## 2018-02-08 DIAGNOSIS — I152 Hypertension secondary to endocrine disorders: Secondary | ICD-10-CM

## 2018-02-08 DIAGNOSIS — E1159 Type 2 diabetes mellitus with other circulatory complications: Secondary | ICD-10-CM | POA: Diagnosis not present

## 2018-02-08 MED ORDER — IRBESARTAN 75 MG PO TABS: 75.0000 mg | ORAL_TABLET | Freq: Every day | ORAL | 0 refills | Status: DC

## 2018-02-08 NOTE — Patient Instructions (Addendum)
BEFORE YOU LEAVE: -follow up: 1 month with Dr. Regis Bill - please bring your labs for her to review  Keep log of blood pressures - but only check once every other day at different times. If on average running high can start the irbesartan at night.  Seek care promptly if your symptoms worsen, new concerns arise or you are not improving with treatment.

## 2018-02-08 NOTE — Progress Notes (Signed)
HPI:  Using dictation device. Unfortunately this device frequently misinterprets words/phrases.  [name] is a pleasant 62 year old here for concerns about her blood pressure.  She reports she is on a diuretic and potassium for blood pressure.  She reports that about a year ago she used to be on valsartan as well, but she personally took herself off of this because she did not want to take so many medications.  However she noticed the last day or 2 some higher blood pressure readings when she first wakes up in the morning.  She admits to anxiety and does have some anxiety in the morning.  She also admits that her PCP told her not to check her blood pressure so much, this sometimes causes her blood pressure to go up.  However she continues to check her blood pressure frequently.  And she does get anxious about it.  She reports on average her blood pressure is tender when pretty good at home and will teens to 120/70.  However she saw a high reading at home very early in the morning and got concerned.  She wonders if she needs to be taking a different medication or add back the valsartan.  However she is worried about recalls and coughs.  She had her labs done elsewhere recently and her thyroid is okay.  She sees an integrative clinic and had extensive labs done.  They are adjusting her thyroid dose.  No symptoms today reported such as headaches, chest pain or shortness of breath.  She had some palpitations once when checking her blood pressure.  She tries to eat very healthy and gets regular exercise.  Up to   ROS: See pertinent positives and negatives per HPI.  Past Medical History:  Diagnosis Date  . Anal fissure   . Atrial septal aneurysm / if pfo  echo 6 13  10/23/2011  . Cataract    both eyes  . Depression   . Fatty liver    "pre fatty liver"  . Fibromyalgia   . Headache(784.0)    hx of migraines when younger  . Heart palpitations    hx with normal holter event monitoring  . Hx:  UTI (urinary tract infection)   . Hyperlipidemia   . Hypertension   . Hypothyroidism   . Obesity   . Pneumonia 1972   hx of  . Polyp of colon   . 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 Skin cancer    basal, squamous cell    Past Surgical History:  Procedure Laterality Date  . COLONOSCOPY    . DILATION AND CURETTAGE OF UTERUS  1978  . [place]  . KNEE ARTHROSCOPY     both in past  . POLYPECTOMY    . SHOULDER OPEN ROTATOR CUFF REPAIR Left 07/07/2013   Procedure: LEFT SHOULDER MINI OPEN SUBACROMIAL DECOMPRESSION ROTATOR CUFF REPAIR AND POSSIBLE PATCH GRAFT ;  Surgeon: Johnn Hai, MD;  Location: [facility];  Service: Orthopedics;  Laterality: Left;  with interscaline block  . SKIN CANCER EXCISION Bilateral    arm, legs, and chest  . TONSILLECTOMY      Family History  Problem Relation Age of Onset  . Hypertension Mother        low borderline  . Osteoporosis Mother   . Hypertension Father   . Liver disease Father        amyloid deceased  . Hyperlipidemia Father   . Melanoma Sister   . Cancer Sister   .  Juvenile Diabetes Daughter   . ADD / ADHD Child   . Hyperlipidemia Unknown        Maternal grandmother  . Colon cancer Neg Hx   . Stomach cancer Neg Hx   . Colon polyps Neg Hx   . Esophageal cancer Neg Hx   . Rectal cancer Neg Hx     SOCIAL HX: See HPI   Current Outpatient Medications:  .  Ascorbic Acid (VITAMIN C) 1000 MG tablet, Take 1,000 mg by mouth daily., Disp: , Rfl:  .  Cholecalciferol (VITAMIN D3) 10000 UNITS capsule, Take 10,000 Units by mouth daily., Disp: , Rfl:  .  DHEA 10 MG TABS, Take 1 tablet by mouth daily. , Disp: , Rfl:  .  hydrochlorothiazide (HYDRODIURIL) 25 MG tablet, TAKE 1 TABLET BY MOUTH EVERY DAY OR AS DIRECTED, Disp: 90 tablet, Rfl: 1 .  MAGNESIUM CARBONATE PO, Take 1 tablet by mouth daily., Disp: , Rfl:  .  NATURE-THROID 97.5 MG TABS, daily. , Disp: , Rfl:  .  OVER THE COUNTER MEDICATION, Essential Pro, Disp: , Rfl:   .  potassium chloride SA (KLOR-CON M20) 20 MEQ tablet, Take 1 tablet (20 mEq total) by mouth daily., Disp: 30 tablet, Rfl: 5 .  Progesterone Micronized (PROGESTERONE PO), Take 200 mg by mouth daily. , Disp: , Rfl:  .  VITAMIN K PO, Take 400 mcg by mouth daily., Disp: , Rfl:  .  irbesartan (AVAPRO) 75 MG tablet, Take 1 tablet (75 mg total) by mouth daily., Disp: 30 tablet, Rfl: 0  EXAM:  Vitals:   02/08/18 0859  BP: 120/72  Pulse: 91  Temp: 98.1 F (36.7 C)    Body mass index is 31.94 kg/m.  GENERAL: vitals reviewed and listed above, alert, oriented, appears well hydrated and in no acute distress  HEENT: atraumatic, conjunttiva clear, no obvious abnormalities on inspection of external nose and ears  NECK: no obvious masses on inspection  LUNGS: clear to auscultation bilaterally, no wheezes, rales or rhonchi, good air movement  CV: HRRR, no peripheral edema  MS: moves all extremities without noticeable abnormality  PSYCH: pleasant and cooperative, no obvious depression or anxiety  ASSESSMENT AND PLAN:  Discussed the following assessment and plan:  Hypertension associated with diabetes ([place])  -Her blood pressure was great on recheck here, however she is quite anxious about this and wants to think about increasing her blood pressure regimen -We discussed various options, she plans to monitor less frequently, as anxiety could be contributing. If she is seeing a trend of it running higher she may add a low-dose irbesartan in the evenings if it is running consistently over 140 on the top for over 80 on the bottom.  Also we will have her follow-up with her primary doctor in about a month regarding.  Advised that she bring her lab results with her. -Patient advised to follow-up sooner if symptoms worsen  or new concerns arise.  Patient Instructions  BEFORE YOU LEAVE: -follow up: 1 month with Dr. Regis Bill - please bring your labs for her to review  Keep log of blood pressures -  but only check once every other day at different times. If on average running high can start the irbesartan at night.  Seek care promptly if your symptoms worsen, new concerns arise or you are not improving with treatment.     Lucretia Kern, DO

## 2018-02-19 DIAGNOSIS — R5383 Other fatigue: Secondary | ICD-10-CM | POA: Diagnosis not present

## 2018-02-19 DIAGNOSIS — E039 Hypothyroidism, unspecified: Secondary | ICD-10-CM | POA: Diagnosis not present

## 2018-02-19 DIAGNOSIS — M797 Fibromyalgia: Secondary | ICD-10-CM | POA: Diagnosis not present

## 2018-02-19 DIAGNOSIS — J309 Allergic rhinitis, unspecified: Secondary | ICD-10-CM | POA: Diagnosis not present

## 2018-02-24 ENCOUNTER — Encounter: Payer: Self-pay | Admitting: Internal Medicine

## 2018-03-09 NOTE — Progress Notes (Deleted)
No chief complaint on file.   HPI: [name] 62 y.o. come in for Chronic disease management  ROS: See pertinent positives and negatives per HPI.  Past Medical History:  Diagnosis Date  . Anal fissure   . Atrial septal aneurysm / if pfo  echo 6 13  10/23/2011  . Cataract    both eyes  . Depression   . Fatty liver    "pre fatty liver"  . Fibromyalgia   . Headache(784.0)    hx of migraines when younger  . Heart palpitations    hx with normal holter event monitoring  . Hx: UTI (urinary tract infection)   . Hyperlipidemia   . Hypertension   . Hypothyroidism   . Obesity   . Pneumonia 1972   hx of  . Polyp of colon   . 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 Skin cancer    basal, squamous cell    Family History  Problem Relation Age of Onset  . Hypertension Mother        low borderline  . Osteoporosis Mother   . Hypertension Father   . Liver disease Father        amyloid deceased  . Hyperlipidemia Father   . Melanoma Sister   . Cancer Sister   . Juvenile Diabetes Daughter   . ADD / ADHD Child   . Hyperlipidemia Unknown        Maternal grandmother  . Colon cancer Neg Hx   . Stomach cancer Neg Hx   . Colon polyps Neg Hx   . Esophageal cancer Neg Hx   . Rectal cancer Neg Hx     Social History   Socioeconomic History  . Marital status: Married    Spouse name: Not on file  . Number of children: 6  . Years of education: Not on file  . Highest education level: Not on file  Occupational History  . Occupation: TEACHER ASST    Employer: [employer]  Social Needs  . Financial resource strain: Not on file  . Food insecurity:    Worry: Not on file    Inability: Not on file  . Transportation needs:    Medical: Not on file    Non-medical: Not on file  Tobacco Use  . Smoking status: Never Smoker  . Smokeless tobacco: Never Used  Substance and Sexual Activity  . Alcohol use: Yes    Comment: rare  . Drug use: No  . Sexual activity: Not on file    Lifestyle  . Physical activity:    Days per week: Not on file    Minutes per session: Not on file  . Stress: Not on file  Relationships  . Social connections:    Talks on phone: Not on file    Gets together: Not on file    Attends religious service: Not on file    Active member of club or organization: Not on file    Attends meetings of clubs or organizations: Not on file    Relationship status: Not on file  Other Topics Concern  . Not on file  Social History Narrative   [employer]  Not workking since last February . 13 left shoulder surgery    Married   Special educ   HH of 4   3 outside dogs, 8 goats, 40 chickens and quail. [place]   Job stresses   Ex husband passes away   Not  working after injur at school  Shoulder neck                 Outpatient Medications Prior to Visit  Medication Sig Dispense Refill  . Ascorbic Acid (VITAMIN C) 1000 MG tablet Take 1,000 mg by mouth daily.    . Cholecalciferol (VITAMIN D3) 10000 UNITS capsule Take 10,000 Units by mouth daily.    Marland Kitchen DHEA 10 MG TABS Take 1 tablet by mouth daily.     . hydrochlorothiazide (HYDRODIURIL) 25 MG tablet TAKE 1 TABLET BY MOUTH EVERY DAY OR AS DIRECTED 90 tablet 1  . irbesartan (AVAPRO) 75 MG tablet Take 1 tablet (75 mg total) by mouth daily. 30 tablet 0  . MAGNESIUM CARBONATE PO Take 1 tablet by mouth daily.    Marland Kitchen NATURE-THROID 97.5 MG TABS daily.     Marland Kitchen OVER THE COUNTER MEDICATION Essential Pro    . potassium chloride SA (KLOR-CON M20) 20 MEQ tablet Take 1 tablet (20 mEq total) by mouth daily. 30 tablet 5  . Progesterone Micronized (PROGESTERONE PO) Take 200 mg by mouth daily.     Marland Kitchen VITAMIN K PO Take 400 mcg by mouth daily.     No facility-administered medications prior to visit.      EXAM:  There were no vitals taken for this visit.  There is no height or weight on file to calculate BMI.  GENERAL: vitals reviewed and listed above, alert, oriented, appears  well hydrated and in no acute distress HEENT: atraumatic, conjunctiva  clear, no obvious abnormalities on inspection of external nose and ears OP : no lesion edema or exudate  NECK: no obvious masses on inspection palpation  LUNGS: clear to auscultation bilaterally, no wheezes, rales or rhonchi, good air movement CV: HRRR, no clubbing cyanosis or  peripheral edema nl cap refill  MS: moves all extremities without noticeable focal  abnormality PSYCH: pleasant and cooperative, no obvious depression or anxiety Lab Results  Component Value Date   WBC 7.5 01/14/2018   HGB 13.5 01/14/2018   HCT 39 01/14/2018   PLT 240 01/14/2018   GLUCOSE 96 07/02/2017   CHOL 262 (A) 01/14/2018   TRIG 63 01/14/2018   HDL 73 (A) 01/14/2018   LDLDIRECT 165.2 12/27/2012   LDLCALC 173 01/14/2018   ALT 24 01/14/2018   AST 21 01/14/2018   NA 142 01/14/2018   K 3.6 01/14/2018   CL 101 07/02/2017   CREATININE 0.80 07/02/2017   BUN 18 07/02/2017   CO2 27 07/02/2017   TSH 1.96 01/14/2018   HGBA1C 5.2 06/01/2013   BP Readings from Last 3 Encounters:  02/08/18 120/72  11/05/17 136/82  09/03/17 138/80    ASSESSMENT AND PLAN:  Discussed the following assessment and plan:  Medication management  Hypothyroidism, unspecified type  Essential hypertension  -Patient advised to return or notify health care team  if  new concerns arise.  There are no Patient Instructions on file for this visit.   Standley Brooking. Panosh M.D.

## 2018-03-10 ENCOUNTER — Ambulatory Visit: Payer: Self-pay | Admitting: Internal Medicine

## 2018-03-16 ENCOUNTER — Encounter: Payer: Self-pay | Admitting: Internal Medicine

## 2018-03-16 ENCOUNTER — Ambulatory Visit (INDEPENDENT_AMBULATORY_CARE_PROVIDER_SITE_OTHER): Payer: BLUE CROSS/BLUE SHIELD | Admitting: Internal Medicine

## 2018-03-16 VITALS — BP 158/80 | HR 101 | Temp 98.0°F | Wt 196.0 lb

## 2018-03-16 DIAGNOSIS — E039 Hypothyroidism, unspecified: Secondary | ICD-10-CM | POA: Diagnosis not present

## 2018-03-16 DIAGNOSIS — Z79899 Other long term (current) drug therapy: Secondary | ICD-10-CM | POA: Diagnosis not present

## 2018-03-16 DIAGNOSIS — E785 Hyperlipidemia, unspecified: Secondary | ICD-10-CM

## 2018-03-16 DIAGNOSIS — I10 Essential (primary) hypertension: Secondary | ICD-10-CM | POA: Diagnosis not present

## 2018-03-16 NOTE — Patient Instructions (Addendum)
I think   irbesartan is safe to try. just enough for BP control  The cholesterol is high but ok to do plant based diet .  Too much thyroid medication is  More risky than  Too little.  Goal bp  Is 120/80.     ok to use walking and   breathing exercises  .  After a  month see what readings are    Wt Readings from Last 3 Encounters:  03/16/18 196 lb (88.9 kg)  02/08/18 197 lb 14.4 oz (89.8 kg)  11/05/17 198 lb 4 oz (89.9 kg)

## 2018-03-16 NOTE — Progress Notes (Signed)
Chief Complaint  Patient presents with  . Follow-up    Lab review. Discuss BPs - pt brought a log. Some high readings. Pt did not take BP medication as prescribed, "too many recalls"    HPI: [name] 62 y.o. come in for Chronic disease management  Had send in readings and copy of lab ahead of time   See above  Has readings  [name] a log  Was doing at goal but stopped medicine when recalls happened but now creeping up . Daughter just ha preeclampsia but delivered  Healthy 32 week baby .     So stress but is losing weight  Non trad doc in her thyoid med whn free t3 slightlyl ow 2.1 but tsh and t4 nl    bp shot up and some palpiations  So back down to reg.   Lab reviewed  liver nl lipid up some ldl  Still gets leg cramps at times  ROS: See pertinent positives and negatives per HPI. Can gegt hb in ears   No neuro sx   Past Medical History:  Diagnosis Date  . Anal fissure   . Atrial septal aneurysm / if pfo  echo 6 13  10/23/2011  . Cataract    both eyes  . Depression   . Fatty liver    "pre fatty liver"  . Fibromyalgia   . Headache(784.0)    hx of migraines when younger  . Heart palpitations    hx with normal holter event monitoring  . Hx: UTI (urinary tract infection)   . Hyperlipidemia   . Hypertension   . Hypothyroidism   . Obesity   . Pneumonia 1972   hx of  . Polyp of colon   . 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 Skin cancer    basal, squamous cell    Family History  Problem Relation Age of Onset  . Hypertension Mother        low borderline  . Osteoporosis Mother   . Hypertension Father   . Liver disease Father        amyloid deceased  . Hyperlipidemia Father   . Melanoma Sister   . Cancer Sister   . Juvenile Diabetes Daughter   . ADD / ADHD Child   . Hyperlipidemia Unknown        Maternal grandmother  . Colon cancer Neg Hx   . Stomach cancer Neg Hx   . Colon polyps Neg Hx   . Esophageal cancer Neg Hx   . Rectal cancer Neg Hx     Social  History   Socioeconomic History  . Marital status: Married    Spouse name: Not on file  . Number of children: 6  . Years of education: Not on file  . Highest education level: Not on file  Occupational History  . Occupation: TEACHER ASST    Employer: [employer]  Social Needs  . Financial resource strain: Not on file  . Food insecurity:    Worry: Not on file    Inability: Not on file  . Transportation needs:    Medical: Not on file    Non-medical: Not on file  Tobacco Use  . Smoking status: Never Smoker  . Smokeless tobacco: Never Used  Substance and Sexual Activity  . Alcohol use: Yes    Comment: rare  . Drug use: No  . Sexual activity: Not on file  Lifestyle  . Physical activity:    Days per week: Not  on file    Minutes per session: Not on file  . Stress: Not on file  Relationships  . Social connections:    Talks on phone: Not on file    Gets together: Not on file    Attends religious service: Not on file    Active member of club or organization: Not on file    Attends meetings of clubs or organizations: Not on file    Relationship status: Not on file  Other Topics Concern  . Not on file  Social History Narrative   [employer]  Not workking since last February . 13 left shoulder surgery    Married   Special educ   HH of 4   3 outside dogs, 8 goats, 40 chickens and quail. [place]   Job stresses   Ex husband passes away   Not working after injur at school  Shoulder neck                 Allergies as of 03/16/2018      Reactions   Norvasc [amlodipine Besylate] Other (See Comments)   Fatigue and dizziness   Cefdinir    REACTION: Rash, swelling and itching   Penicillins    REACTION: swelling up as a child   Tylenol [acetaminophen]    Pre fatty liver: does not takes these   Zyrtec [cetirizine]       Medication List        Accurate as of 03/16/18  5:40 PM. Always use your most recent med list.            DHEA 10 MG Tabs Take 1 tablet by mouth daily.   hydrochlorothiazide 25 MG tablet Commonly known as:  HYDRODIURIL TAKE 1 TABLET BY MOUTH EVERY DAY OR AS DIRECTED   irbesartan 75 MG tablet Commonly known as:  AVAPRO Take 1 tablet (75 mg total) by mouth daily.   MAGNESIUM CARBONATE PO Take 1 tablet by mouth daily.   NATURE-THROID 97.5 MG Tabs Generic drug:  Thyroid daily.   OVER THE COUNTER MEDICATION Essential Pro   potassium chloride SA 20 MEQ tablet Commonly known as:  K-DUR,KLOR-CON Take 1 tablet (20 mEq total) by mouth daily.   PROGESTERONE PO Take 200 mg by mouth daily.   vitamin C 1000 MG tablet Take 1,000 mg by mouth daily.   VITAMIN K PO Take 400 mcg by mouth daily.         EXAM:  BP (!) 158/80 (BP Location: Left Arm, Patient Position: Sitting, Cuff Size: Large)   Pulse (!) 101   Temp 98 F (36.7 C) (Oral)   Wt 196 lb (88.9 kg)   BMI 31.64 kg/m   Body mass index is 31.64 kg/m.  GENERAL: vitals reviewed and listed above, alert, oriented, appears well hydrated and in no acute distress HEENT: atraumatic, conjunctiva  clear, no obvious abnormalities on inspection of external nose and earstms clear  OP : no lesion edema or exudate  NECK: no obvious masses on inspection palpation  LUNGS: clear to auscultation bilaterally, no wheezes, rales or rhonchi, good air movement CV: HRRR, no clubbing cyanosis or  peripheral edema nl cap refill  MS: moves all extremities without noticeable focal  abnormality PSYCH: pleasant and cooperative, no obvious depression or anxiety Lab Results  Component Value Date   WBC 7.5 01/14/2018   HGB 13.5 01/14/2018   HCT 39 01/14/2018   PLT 240 01/14/2018   GLUCOSE 96 07/02/2017  CHOL 262 (A) 01/14/2018   TRIG 63 01/14/2018   HDL 73 (A) 01/14/2018   LDLDIRECT 165.2 12/27/2012   LDLCALC 173 01/14/2018   ALT 24 01/14/2018   AST 21 01/14/2018   NA 142 01/14/2018   K 3.6 01/14/2018   CL 101 07/02/2017   CREATININE 0.80  07/02/2017   BUN 18 07/02/2017   CO2 27 07/02/2017   TSH 1.96 01/14/2018   HGBA1C 5.2 06/01/2013  `` BP Readings from Last 3 Encounters:  03/16/18 (!) 158/80  02/08/18 120/72  11/05/17 136/82   readings and labs reviewed  ASSESSMENT AND PLAN:  Discussed the following assessment and plan:  Essential hypertension - see notes  stopped cause of recalls  up recnetly  will give it 2 more weeks and if not start the ibasartan 75    Medication management  Hyperlipidemia, unspecified hyperlipidemia type - ldl 173  advise med and plant based diet wants to not use med if possible weight has coe own over the year  Hypothyroidism, unspecified type - rx elsewhere   advise not to over suppress for cv risk reasons Weight has come down over the past years   Below 200  At one point was in 240 250 range and more recelty 226 so doing well with this -Patient advised to return or notify health care team  if  new concerns arise.  Patient Instructions   I think   irbesartan is safe to try. just enough for BP control  The cholesterol is high but ok to do plant based diet .  Too much thyroid medication is  More risky than  Too little.  Goal bp  Is 120/80.     ok to use walking and   breathing exercises  .  After a  month see what readings are    Wt Readings from Last 3 Encounters:  03/16/18 196 lb (88.9 kg)  02/08/18 197 lb 14.4 oz (89.8 kg)  11/05/17 198 lb 4 oz (89.9 kg)        Keyla Milone K. Josean Lycan M.D.

## 2018-03-17 DIAGNOSIS — Z6831 Body mass index (BMI) 31.0-31.9, adult: Secondary | ICD-10-CM | POA: Diagnosis not present

## 2018-03-17 DIAGNOSIS — Z01419 Encounter for gynecological examination (general) (routine) without abnormal findings: Secondary | ICD-10-CM | POA: Diagnosis not present

## 2018-04-15 ENCOUNTER — Ambulatory Visit (HOSPITAL_COMMUNITY): Payer: Self-pay

## 2018-04-15 DIAGNOSIS — Z1231 Encounter for screening mammogram for malignant neoplasm of breast: Secondary | ICD-10-CM | POA: Diagnosis not present

## 2018-04-21 DIAGNOSIS — J309 Allergic rhinitis, unspecified: Secondary | ICD-10-CM | POA: Diagnosis not present

## 2018-04-21 DIAGNOSIS — E039 Hypothyroidism, unspecified: Secondary | ICD-10-CM | POA: Diagnosis not present

## 2018-04-21 DIAGNOSIS — R5383 Other fatigue: Secondary | ICD-10-CM | POA: Diagnosis not present

## 2018-04-21 DIAGNOSIS — M797 Fibromyalgia: Secondary | ICD-10-CM | POA: Diagnosis not present

## 2018-04-27 ENCOUNTER — Other Ambulatory Visit: Payer: Self-pay | Admitting: Internal Medicine

## 2018-04-27 MED ORDER — POTASSIUM CHLORIDE CRYS ER 20 MEQ PO TBCR: 20.0000 meq | EXTENDED_RELEASE_TABLET | Freq: Every day | ORAL | 1 refills | Status: DC

## 2018-04-27 NOTE — Telephone Encounter (Signed)
Copied from [place] [id number]. Topic: Quick Communication - Rx Refill/Question >> Apr 27, 2018  1:42 PM Sheppard Coil, [employer] wrote: Medication: potassium chloride SA (KLOR-CON M20) 20 MEQ tablet  Has the patient contacted their pharmacy? Yes - states they have told her they reached out to [place] 5 days ago with no response.  Pt states that she has 2 pills left (Agent: If no, request that the patient contact the pharmacy for the refill.) (Agent: If yes, when and what did the pharmacy advise?)  Preferred Pharmacy (with phone number or street name): [facility] # [id number], [place] [phone] (Phone) [phone] (Fax)  Agent: Please be advised that RX refills may take up to 3 business days. We ask that you follow-up with your pharmacy.

## 2018-04-30 ENCOUNTER — Other Ambulatory Visit: Payer: Self-pay | Admitting: *Deleted

## 2018-04-30 MED ORDER — POTASSIUM CHLORIDE CRYS ER 20 MEQ PO TBCR: 20.0000 meq | EXTENDED_RELEASE_TABLET | Freq: Every day | ORAL | 1 refills | Status: DC

## 2018-04-30 NOTE — Addendum Note (Signed)
Addended by: Elliot Cousin on: 04/30/2018 10:24 AM   Modules accepted: Orders

## 2018-05-03 ENCOUNTER — Other Ambulatory Visit: Payer: Self-pay | Admitting: Internal Medicine

## 2018-05-04 ENCOUNTER — Ambulatory Visit (INDEPENDENT_AMBULATORY_CARE_PROVIDER_SITE_OTHER): Payer: Self-pay | Admitting: Internal Medicine

## 2018-05-04 ENCOUNTER — Encounter: Payer: Self-pay | Admitting: Internal Medicine

## 2018-05-04 ENCOUNTER — Ambulatory Visit: Payer: Self-pay

## 2018-05-04 VITALS — BP 148/74 | HR 105 | Temp 98.7°F | Wt 195.4 lb

## 2018-05-04 DIAGNOSIS — R351 Nocturia: Secondary | ICD-10-CM

## 2018-05-04 DIAGNOSIS — I10 Essential (primary) hypertension: Secondary | ICD-10-CM

## 2018-05-04 DIAGNOSIS — Z79899 Other long term (current) drug therapy: Secondary | ICD-10-CM

## 2018-05-04 DIAGNOSIS — F439 Reaction to severe stress, unspecified: Secondary | ICD-10-CM

## 2018-05-04 MED ORDER — IRBESARTAN 75 MG PO TABS: 75.0000 mg | ORAL_TABLET | Freq: Every day | ORAL | 3 refills | Status: DC

## 2018-05-04 NOTE — Telephone Encounter (Signed)
Reported episodes of elevated BP and heart rate over past 3-4 days.  Gave the following readings: 12/24 @ 3:41 AM 176/98, p. 151; decreased to 120/77, p. 83 @ 3:54 AM.   Reported she had an acid stomach at that time; denied chest pain.  Reported 188/103, P 150 @ 11:45 PM on 12/21.  Stated she has been able to do breathing exercises and bring the BP and pulse rate down within several minutes.  Reported readings about one week ago ranging 124/77-150/73.   Reported she has a lot of stress in her life.  Pt. shared stress with family members, and issues r/t family dynamics.  Spent approx. 45 min. talking to pt., and allowing her to vent.  Appt. sched. today with PCP for BP evaluation and management.  Care advice given per protocol.  Verb. Understanding.            Reason for Disposition . Systolic BP  >= 914 OR Diastolic >= 782 . Problems with anxiety or stress  Answer Assessment - Initial Assessment Questions 1. BLOOD PRESSURE: "What is the blood pressure?" "Did you take at least two measurements 5 minutes apart?"     176/98, pulse 151 @ 3:41 AM; 165/90, pulse 119 @ 3:44 AM ; 120/77 pulse 83 @ 3:54 AM 2. ONSET: "When did you take your blood pressure?"     See above 3. HOW: "How did you obtain the blood pressure?" (e.g., visiting nurse, automatic home BP monitor)     Digital machine 4. HISTORY: "Do you have a history of high blood pressure?"     Yes; has intermittent elevated readings 5. MEDICATIONS: "Are you taking any medications for blood pressure?" "Have you missed any doses recently?"     HCTZ 6. OTHER SYMPTOMS: "Do you have any symptoms?" (e.g., headache, chest pain, blurred vision, difficulty breathing, weakness)     Denied chest pain; feels like an acid stomach, but denied heart burn 7. PREGNANCY: "Is there any chance you are pregnant?" "When was your last menstrual period?"     n/a  Answer Assessment - Initial Assessment Questions 1. DESCRIPTION: "Please describe your heart rate or heart  beat that you are having" (e.g., fast/slow, regular/irregular, skipped or extra beats, "palpitations")     Episodes of fast heartrate 151 @ 3:41 AM 12/24; 119 @ 3:43;  12/21 @ 11:45 PM 150   2. ONSET: "When did it start?" (Minutes, hours or days)     Intermittent episodes 3. DURATION: "How long does it last" (e.g., seconds, minutes, hours)     Short intervals 4. PATTERN "Does it come and go, or has it been constant since it started?"  "Does it get worse with exertion?"   "Are you feeling it now?"     Comes and goes 5. TAP: "Using your hand, can you tap out what you are feeling on a chair or table in front of you, so that I can hear?" (Note: not all patients can do this)       n/a 6. HEART RATE: "Can you tell me your heart rate?" "How many beats in 15 seconds?"  (Note: not all patients can do this)       9:08 AM (during call) BP 144/92, Pulse 79 7. RECURRENT SYMPTOM: "Have you ever had this before?" If so, ask: "When was the last time?" and "What happened that time?"      Yes; occurs with stress 8. CAUSE: "What do you think is causing the palpitations?"     Several  stressors within the family/ family dynamics  9. CARDIAC HISTORY: "Do you have any history of heart disease?" (e.g., heart attack, angina, bypass surgery, angioplasty, arrhythmia)      hypertension 10. OTHER SYMPTOMS: "Do you have any other symptoms?" (e.g., dizziness, chest pain, sweating, difficulty breathing)       Burning in chest  11. PREGNANCY: "Is there any chance you are pregnant?" "When was your last menstrual period?"      N/a  Protocols used: HIGH BLOOD PRESSURE-A-AH, HEART RATE AND HEARTBEAT QUESTIONS-A-AH

## 2018-05-04 NOTE — Patient Instructions (Addendum)
Take the irbesartan at night .    Try taking the hctz in am    To see if helps the night  Urgency .  If bp is too low then we can dec the hctz  To  1/2 .    Check  Bmp and magnesium  In 3-4 weeks . After changes .

## 2018-05-04 NOTE — Progress Notes (Signed)
Chief Complaint  Patient presents with  . Hypertension    has had 4 spikes since end of september has log with her today. No blurred,vision, dizziness, or headaches    HPI: [name] 62 y.o. come in for Chronic disease management   SDA   bp monitoring  Sent in by  nurse triage ause of   bp spikes and stress  .  Awoke needed int urinate and bp was up. 177 range and then  Went down 129/90 hours  Later.   This am  Awoke. Again with urgency taking  hctz at night  No osa but has some snoring  ( cost and issue of getting tested)  Has urin frequency anyway to taking hctx at night.  Systolic pressure is labile   Some stress possible .causebut worried about this. Never took ibesartan cause of concern of  Contaminants in previous bp meds etc   ROS: See pertinent positives and negatives per HPI. No current cp sob right lat ankle swells at times   Past Medical History:  Diagnosis Date  . Anal fissure   . Atrial septal aneurysm / if pfo  echo 6 13  10/23/2011  . Cataract    both eyes  . Depression   . Fatty liver    "pre fatty liver"  . Fibromyalgia   . Headache(784.0)    hx of migraines when younger  . Heart palpitations    hx with normal holter event monitoring  . Hx: UTI (urinary tract infection)   . Hyperlipidemia   . Hypertension   . Hypothyroidism   . Obesity   . Pneumonia 1972   hx of  . Polyp of colon   . 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 Skin cancer    basal, squamous cell    Family History  Problem Relation Age of Onset  . Hypertension Mother        low borderline  . Osteoporosis Mother   . Hypertension Father   . Liver disease Father        amyloid deceased  . Hyperlipidemia Father   . Melanoma Sister   . Cancer Sister   . Juvenile Diabetes Daughter   . ADD / ADHD Child   . Hyperlipidemia Unknown        Maternal grandmother  . Colon cancer Neg Hx   . Stomach cancer Neg Hx   . Colon polyps Neg Hx   . Esophageal cancer Neg Hx   . Rectal cancer  Neg Hx     Social History   Socioeconomic History  . Marital status: Married    Spouse name: Not on file  . Number of children: 6  . Years of education: Not on file  . Highest education level: Not on file  Occupational History  . Occupation: TEACHER ASST    Employer: [employer]  Social Needs  . Financial resource strain: Not on file  . Food insecurity:    Worry: Not on file    Inability: Not on file  . Transportation needs:    Medical: Not on file    Non-medical: Not on file  Tobacco Use  . Smoking status: Never Smoker  . Smokeless tobacco: Never Used  Substance and Sexual Activity  . Alcohol use: Yes    Comment: rare  . Drug use: No  . Sexual activity: Not on file  Lifestyle  . Physical activity:    Days per week: Not on file    Minutes  per session: Not on file  . Stress: Not on file  Relationships  . Social connections:    Talks on phone: Not on file    Gets together: Not on file    Attends religious service: Not on file    Active member of club or organization: Not on file    Attends meetings of clubs or organizations: Not on file    Relationship status: Not on file  Other Topics Concern  . Not on file  Social History Narrative   [employer]  Not workking since last February . 13 left shoulder surgery    Married   Special educ   HH of 4   3 outside dogs, 8 goats, 40 chickens and quail. [place]   Job stresses   Ex husband passes away   Not working after injur at school  Shoulder neck                 Outpatient Medications Prior to Visit  Medication Sig Dispense Refill  . Ascorbic Acid (VITAMIN C) 1000 MG tablet Take 1,000 mg by mouth daily.    . hydrochlorothiazide (HYDRODIURIL) 25 MG tablet TAKE 1 TABLET BY MOUTH EVERY DAY OR AS DIRECTED 30 tablet 0  . MAGNESIUM CARBONATE PO Take 1 tablet by mouth daily.    Marland Kitchen NATURE-THROID 97.5 MG TABS daily. 1 tablet 4 days a week and half tablet 3 days a week    .  Nutritional Supplements (DHEA PO) Take 1 tablet by mouth daily. 3mg     . OVER THE COUNTER MEDICATION Essential Pro    . potassium chloride SA (KLOR-CON M20) 20 MEQ tablet Take 1 tablet (20 mEq total) by mouth daily. 90 tablet 1  . Progesterone Micronized (PROGESTERONE PO) Take 150 mg by mouth daily.     Marland Kitchen VITAMIN K PO Take 400 mcg by mouth daily.    . irbesartan (AVAPRO) 75 MG tablet Take 1 tablet (75 mg total) by mouth daily. (Patient not taking: Reported on 05/04/2018) 30 tablet 0   No facility-administered medications prior to visit.      EXAM:  BP (!) 148/74 (BP Location: Left Arm, Patient Position: Sitting, Cuff Size: Large)   Pulse (!) 105   Temp 98.7 F (37.1 C) (Oral)   Wt 195 lb 6.4 oz (88.6 kg)   SpO2 97%   BMI 31.54 kg/m   Body mass index is 31.54 kg/m.  GENERAL: vitals reviewed and listed above, alert, oriented, appears well hydrated and in no acute distress HEENT: atraumatic, conjunctiva  clear, no obvious abnormalities on inspection of external nose and ears PSYCH: pleasant and cooperative, mild anxiety ocass emoitional  Cognition is good  And insightful Lab Results  Component Value Date   WBC 7.5 01/14/2018   HGB 13.5 01/14/2018   HCT 39 01/14/2018   PLT 240 01/14/2018   GLUCOSE 96 07/02/2017   CHOL 262 (A) 01/14/2018   TRIG 63 01/14/2018   HDL 73 (A) 01/14/2018   LDLDIRECT 165.2 12/27/2012   LDLCALC 173 01/14/2018   ALT 24 01/14/2018   AST 21 01/14/2018   NA 142 01/14/2018   K 3.6 01/14/2018   CL 101 07/02/2017   CREATININE 0.80 07/02/2017   BUN 18 07/02/2017   CO2 27 07/02/2017   TSH 1.96 01/14/2018   HGBA1C 5.2 06/01/2013   BP Readings from Last 3 Encounters:  05/04/18 (!) 148/74  03/16/18 (!) 158/80  02/08/18 120/72    ASSESSMENT  AND PLAN:  Discussed the following assessment and plan:  Essential hypertension - Plan: Basic metabolic panel, Magnesium  Medication management - Plan: Basic metabolic panel, Magnesium  Stress - Plan: Basic  metabolic panel, Magnesium  Nocturia See pat instruction   About med  Suspect stress  Adding but baseline bp could be better     Nocturnal urinatin  Could be from med and thus bp elevation  consider osa but  Less likely? Total visit 33mins > 50% spent counseling and coordinating care as indicated in above note and in instructions to patient .   Lab in 3-4 weeks after med change and then go from there  Patient Instructions  Take the irbesartan at night .    Try taking the hctz in am    To see if helps the night  Urgency .  If bp is too low then we can dec the hctz  To  1/2 .    Check  Bmp and magnesium  In 3-4 weeks . After changes .     Standley Brooking. Panosh M.D.

## 2018-05-07 NOTE — Telephone Encounter (Signed)
Please advise Dr Panosh, thanks.   

## 2018-05-13 ENCOUNTER — Other Ambulatory Visit: Payer: Self-pay | Admitting: Internal Medicine

## 2018-05-19 ENCOUNTER — Encounter: Payer: Self-pay | Admitting: Internal Medicine

## 2018-05-19 ENCOUNTER — Ambulatory Visit: Payer: BLUE CROSS/BLUE SHIELD | Admitting: Internal Medicine

## 2018-05-19 VITALS — BP 138/78 | HR 95 | Temp 98.4°F | Wt 198.2 lb

## 2018-05-19 DIAGNOSIS — F411 Generalized anxiety disorder: Secondary | ICD-10-CM

## 2018-05-19 DIAGNOSIS — T887XXA Unspecified adverse effect of drug or medicament, initial encounter: Secondary | ICD-10-CM | POA: Diagnosis not present

## 2018-05-19 DIAGNOSIS — R0989 Other specified symptoms and signs involving the circulatory and respiratory systems: Secondary | ICD-10-CM | POA: Diagnosis not present

## 2018-05-19 DIAGNOSIS — Z79899 Other long term (current) drug therapy: Secondary | ICD-10-CM

## 2018-05-19 MED ORDER — LORAZEPAM 1 MG PO TABS: 0.5000 mg | ORAL_TABLET | Freq: Three times a day (TID) | ORAL | 0 refills | Status: DC | PRN

## 2018-05-19 NOTE — Patient Instructions (Addendum)
Stop the irbesartan because it gives you  Side effects.   Make sure taking  bp readings at  Baseline  Not having to urinate  Or  In pain  And after sitting  Relaxing .   If getting   Rapid heart rate .    Over 120 then  Suggest  We get  Cardiology to see you for poss rapid heart rate.   Anxiety and panic can raise BP .   There are  medicaiton for  BP that can lower pulse and BP such as B blocker  If having  Rapid rates .   Can try  pepcid AC   As needed of reflux.

## 2018-05-19 NOTE — Progress Notes (Signed)
Chief Complaint  Patient presents with  . Follow-up    irbesartan is causing acid reflux, heart burn, dizzness, lightedheaded, fatigue, and weight gain on medication     HPI: [name] 63 y.o. come in for  Above   "Severe esophageal painful   Burning" felt to be from med.        Off 3 days   And some better   50 % better.   8 # gain over the week ? From  Med  Has ? About  meds and se  And bp  Realized into taking readings  Right  Checking when has to urinate  And then after 5 min bp was better .   Pulse  In 108  Recently    Had one episode at night with pounding heart  And bp up and pulse 150 last month  Otherwise  Pulse 70 - 90 range   Many stresses  Family  Medical and worries she admits  ROS: See pertinent positives and negatives per HPI.no syncope vomiting    Past Medical History:  Diagnosis Date  . Anal fissure   . Atrial septal aneurysm / if pfo  echo 6 13  10/23/2011  . Cataract    both eyes  . Depression   . Fatty liver    "pre fatty liver"  . Fibromyalgia   . Headache(784.0)    hx of migraines when younger  . Heart palpitations    hx with normal holter event monitoring  . Hx: UTI (urinary tract infection)   . Hyperlipidemia   . Hypertension   . Hypothyroidism   . Obesity   . Pneumonia 1972   hx of  . Polyp of colon   . 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 Skin cancer    basal, squamous cell    Family History  Problem Relation Age of Onset  . Hypertension Mother        low borderline  . Osteoporosis Mother   . Hypertension Father   . Liver disease Father        amyloid deceased  . Hyperlipidemia Father   . Melanoma Sister   . Cancer Sister   . Juvenile Diabetes Daughter   . ADD / ADHD Child   . Hyperlipidemia Unknown        Maternal grandmother  . Colon cancer Neg Hx   . Stomach cancer Neg Hx   . Colon polyps Neg Hx   . Esophageal cancer Neg Hx   . Rectal cancer Neg Hx     Social History   Socioeconomic History  . Marital status:  Married    Spouse name: Not on file  . Number of children: 6  . Years of education: Not on file  . Highest education level: Not on file  Occupational History  . Occupation: TEACHER [employer]    Employer: [employer]  Social Needs  . Financial resource strain: Not on file  . Food insecurity:    Worry: Not on file    Inability: Not on file  . Transportation needs:    Medical: Not on file    Non-medical: Not on file  Tobacco Use  . Smoking status: Never Smoker  . Smokeless tobacco: Never Used  Substance and Sexual Activity  . Alcohol use: Yes    Comment: rare  . Drug use: No  . Sexual activity: Not on file  Lifestyle  . Physical activity:    Days per week: Not on  file    Minutes per session: Not on file  . Stress: Not on file  Relationships  . Social connections:    Talks on phone: Not on file    Gets together: Not on file    Attends religious service: Not on file    Active member of club or organization: Not on file    Attends meetings of clubs or organizations: Not on file    Relationship status: Not on file  Other Topics Concern  . Not on file  Social History Narrative   [employer]  Not workking since last February . 13 left shoulder surgery    Married   Special educ   HH of 4   3 outside dogs, 8 goats, 40 chickens and quail. [place]   Job stresses   Ex husband passes away   Not working after injur at school  Shoulder neck                 Outpatient Medications Prior to Visit  Medication Sig Dispense Refill  . Ascorbic Acid (VITAMIN C) 1000 MG tablet Take 1,000 mg by mouth daily.    . hydrochlorothiazide (HYDRODIURIL) 25 MG tablet TAKE 1 TABLET BY MOUTH EVERY DAY 90 tablet 1  . MAGNESIUM CARBONATE PO Take 1 tablet by mouth daily.    Marland Kitchen NATURE-THROID 97.5 MG TABS daily. 1 tablet 4 days a week and half tablet 3 days a week    . Nutritional Supplements (DHEA PO) Take 1 tablet by mouth daily. 3mg     . OVER THE COUNTER  MEDICATION Essential Pro    . potassium chloride SA (KLOR-CON M20) 20 MEQ tablet Take 1 tablet (20 mEq total) by mouth daily. 90 tablet 1  . Progesterone Micronized (PROGESTERONE PO) Take 150 mg by mouth daily.     Marland Kitchen VITAMIN K PO Take 400 mcg by mouth daily.    . irbesartan (AVAPRO) 75 MG tablet Take 1 tablet (75 mg total) by mouth daily. 30 tablet 3   No facility-administered medications prior to visit.      EXAM:  BP 138/78 (BP Location: Left Arm, Patient Position: Sitting, Cuff Size: Large)   Pulse 95   Temp 98.4 F (36.9 C) (Oral)   Wt 198 lb 3.2 oz (89.9 kg)   BMI 31.99 kg/m   Body mass index is 31.99 kg/m.  GENERAL: vitals reviewed and listed above, alert, oriented, appears well hydrated and in no acute distress  MS: moves all extremities without noticeable focal  abnormality PSYCH: pleasant and cooperative, no obvious depression mod anxiety  Lab Results  Component Value Date   WBC 7.5 01/14/2018   HGB 13.5 01/14/2018   HCT 39 01/14/2018   PLT 240 01/14/2018   GLUCOSE 96 07/02/2017   CHOL 262 (A) 01/14/2018   TRIG 63 01/14/2018   HDL 73 (A) 01/14/2018   LDLDIRECT 165.2 12/27/2012   LDLCALC 173 01/14/2018   ALT 24 01/14/2018   AST 21 01/14/2018   NA 142 01/14/2018   K 3.6 01/14/2018   CL 101 07/02/2017   CREATININE 0.80 07/02/2017   BUN 18 07/02/2017   CO2 27 07/02/2017   TSH 1.96 01/14/2018   HGBA1C 5.2 06/01/2013   BP Readings from Last 3 Encounters:  05/19/18 138/78  05/04/18 (!) 148/74  03/16/18 (!) 158/80   Wt Readings from Last 3 Encounters:  05/19/18 198 lb 3.2 oz (89.9 kg)  05/04/18 195 lb 6.4 oz (88.6 kg)  03/16/18 196 lb (88.9 kg)   ASSESSMENT AND PLAN:  Discussed the following assessment and plan:  Labile hypertension  Medication management  Medication side effect  Anxiety reaction Side effect and many issues with a situational anxiety   Asked to not take reading when stressed   But under certain situation   Counseled.  Try  anxiety med as needed and then plan fu  It is possible that baseline BP  is at goal   And   Disc  adverse metabolic effects of  Diuretic but  Benefit more than risk of medications  to continue. If controls and  No se   At this time stop the ibesartan and check bp less and only under  Conditions disc    Contact if pulse over 120 130 and  range  Episodes  -Patient advised to return or notify health care team  if  new concerns arise. Total visit 58mins > 50% spent counseling and coordinating care as indicated in above note and in instructions to patient .  Patient Instructions  Stop the irbesartan because it gives you  Side effects.   Make sure taking  bp readings at  Baseline  Not having to urinate  Or  In pain  And after sitting  Relaxing .   If getting   Rapid heart rate .    Over 120 then  Suggest  We get  Cardiology to see you for poss rapid heart rate.   Anxiety and panic can raise BP .   There are  medicaiton for  BP that can lower pulse and BP such as B blocker  If having  Rapid rates .   Can try  pepcid AC   As needed of reflux.      Standley Brooking. Khyran Riera M.D.

## 2018-05-20 NOTE — Telephone Encounter (Signed)
Seen at [place]

## 2018-06-12 ENCOUNTER — Emergency Department (HOSPITAL_COMMUNITY)
Admission: EM | Admit: 2018-06-12 | Discharge: 2018-06-13 | Disposition: A | Discharge status: Home / Self Care | Payer: BLUE CROSS/BLUE SHIELD | Attending: Emergency Medicine | Admitting: Emergency Medicine

## 2018-06-12 ENCOUNTER — Encounter (HOSPITAL_COMMUNITY): Payer: Self-pay | Admitting: Emergency Medicine

## 2018-06-12 DIAGNOSIS — R55 Syncope and collapse: Secondary | ICD-10-CM

## 2018-06-12 DIAGNOSIS — R000 Tachycardia, unspecified: Secondary | ICD-10-CM | POA: Diagnosis not present

## 2018-06-12 DIAGNOSIS — Z79899 Other long term (current) drug therapy: Secondary | ICD-10-CM | POA: Diagnosis not present

## 2018-06-12 DIAGNOSIS — I951 Orthostatic hypotension: Secondary | ICD-10-CM | POA: Diagnosis not present

## 2018-06-12 DIAGNOSIS — R4020 Unspecified coma: Secondary | ICD-10-CM | POA: Diagnosis not present

## 2018-06-12 DIAGNOSIS — R42 Dizziness and giddiness: Secondary | ICD-10-CM | POA: Diagnosis not present

## 2018-06-12 DIAGNOSIS — J45909 Unspecified asthma, uncomplicated: Secondary | ICD-10-CM | POA: Insufficient documentation

## 2018-06-12 DIAGNOSIS — I10 Essential (primary) hypertension: Secondary | ICD-10-CM | POA: Insufficient documentation

## 2018-06-12 DIAGNOSIS — Z85828 Personal history of other malignant neoplasm of skin: Secondary | ICD-10-CM | POA: Diagnosis not present

## 2018-06-12 DIAGNOSIS — R0689 Other abnormalities of breathing: Secondary | ICD-10-CM | POA: Diagnosis not present

## 2018-06-12 DIAGNOSIS — E039 Hypothyroidism, unspecified: Secondary | ICD-10-CM | POA: Diagnosis not present

## 2018-06-12 LAB — BASIC METABOLIC PANEL
Anion gap: 9 (ref 5–15)
BUN: 22 mg/dL (ref 8–23)
CO2: 27 mmol/L (ref 22–32)
Calcium: 9.1 mg/dL (ref 8.9–10.3)
Chloride: 103 mmol/L (ref 98–111)
Creatinine, Ser: 0.68 mg/dL (ref 0.44–1.00)
GFR calc Af Amer: >60 mL/min (ref >60)
GFR calc non Af Amer: >60 mL/min (ref >60)
Glucose, Bld: 95 mg/dL (ref 70–99)
Potassium: 3.3 mmol/L — ABNORMAL LOW (ref 3.5–5.1)
Sodium: 139 mmol/L (ref 135–145)

## 2018-06-12 LAB — CBC
HEMATOCRIT: 41.1 % (ref 36.0–46.0)
Hemoglobin: 13.1 g/dL (ref 12.0–15.0)
MCH: 30.2 pg (ref 26.0–34.0)
MCHC: 31.9 g/dL (ref 30.0–36.0)
MCV: 94.7 fL (ref 80.0–100.0)
Platelets: 211 10*3/uL (ref 150–400)
RBC: 4.34 MIL/uL (ref 3.87–5.11)
RDW: 13.2 % (ref 11.5–15.5)
WBC: 9.1 10*3/uL (ref 4.0–10.5)
nRBC: 0 % (ref 0.0–0.2)

## 2018-06-12 LAB — URINALYSIS, ROUTINE W REFLEX MICROSCOPIC
Bilirubin Urine: NEGATIVE
Glucose, UA: NEGATIVE mg/dL
Hgb urine dipstick: NEGATIVE
Ketones, ur: NEGATIVE mg/dL
Leukocytes, UA: NEGATIVE
Nitrite: NEGATIVE
PH: 7 (ref 5.0–8.0)
Protein, ur: NEGATIVE mg/dL
Specific Gravity, Urine: 1.006 (ref 1.005–1.030)

## 2018-06-12 LAB — CBG MONITORING, ED: Glucose-Capillary: 95 mg/dL (ref 70–99)

## 2018-06-12 NOTE — ED Triage Notes (Signed)
Per [facility] called out for unconsciousness, upon arrival patient was on the floor with dizziness and nausea, denies LOC.  Give 4 Zofran IV.

## 2018-06-13 ENCOUNTER — Other Ambulatory Visit: Payer: Self-pay

## 2018-06-13 ENCOUNTER — Emergency Department (HOSPITAL_COMMUNITY): Payer: BLUE CROSS/BLUE SHIELD

## 2018-06-13 DIAGNOSIS — R42 Dizziness and giddiness: Secondary | ICD-10-CM | POA: Diagnosis not present

## 2018-06-13 DIAGNOSIS — R55 Syncope and collapse: Secondary | ICD-10-CM | POA: Diagnosis not present

## 2018-06-13 MED ORDER — IOPAMIDOL (ISOVUE-370) INJECTION 76%
100.0000 mL | Freq: Once | INTRAVENOUS | Status: AC | PRN
  Administered 2018-06-13: 100 mL via INTRAVENOUS

## 2018-06-13 NOTE — ED Provider Notes (Signed)
Been working [facility] Provider Note   CSN: [id number] Arrival date & time: 06/12/18  2219     History   Chief Complaint Chief Complaint  Patient presents with  . Dizziness    HPI [name] is a 63 y.o. female.  HPI  63 year old female comes in a chief complaint of dizziness. Patient has history of atrial septal aneurysm, hyperlipidemia, hypertension, asthma.  She also has family history of brain aneurysm.  Patient states that around suppertime she started getting sudden onset of dizziness with a feeling that she was going to faint.  Patient was also nauseated and tried to get up from the table but she fell down immediately.  Patient does not think that she fainted -as she recalls the act of falling.  After she fell she was slow with a response according to family.  Patient states that she was not confused and remembers her family talking to her.  She denies any palpitations, chest pain, vision changes, focal weakness, numbness or slurring of the speech.  Patient has never had similar symptoms in the past.  In route to the ER her symptoms of dizziness resolved.  Patient is feeling cold at the moment and she is noted to be tachycardic, but she denies any history of anxiety and does not have any infection-like symptoms.  Past Medical History:  Diagnosis Date  . Anal fissure   . Atrial septal aneurysm / if pfo  echo 6 13  10/23/2011  . Cataract    both eyes  . Depression   . Fatty liver    "pre fatty liver"  . Fibromyalgia   . Headache(784.0)    hx of migraines when younger  . Heart palpitations    hx with normal holter event monitoring  . Hx: UTI (urinary tract infection)   . Hyperlipidemia   . Hypertension   . Hypothyroidism   . Obesity   . Pneumonia 1972   hx of  . Polyp of colon   . 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 Skin cancer    basal, squamous cell    Patient Active Problem List   Diagnosis Date Noted  . Hepatic steatosis  11/27/2014  . Hx of adenomatous colonic polyps 11/27/2014  . Visit for preventive health examination 03/28/2014  . Hyperlipidemia 03/28/2014  . Left rotator cuff tear 07/07/2013  . Tear of left rotator cuff 07/07/2013  . Rhinitis 06/01/2013  . Chest wall deformity 04/12/2012  . Atrial septal aneurysm / if pfo  echo 6 13  10/23/2011  . OSA (obstructive sleep apnea) 11/27/2010  . Dyslipidemia 11/01/2010  . Flushing 08/29/2010  . Labile hypertension 08/29/2010  . MUSCLE CRAMPS, FOOT 06/10/2010  . OTHER SLEEP DISTURBANCES 06/10/2010  . ANXIETY, SITUATIONAL 05/08/2010  . VERTIGO, POSITIONAL 03/15/2010  . VITAMIN D DEFICIENCY 02/18/2010  . OBESITY 02/18/2010  . ALLERGIC RHINITIS 02/18/2010  . PLANTAR FASCIITIS 02/18/2010  . TWITCHING 06/11/2009  . NUMBNESS, HAND 06/11/2009  . CERVICAL STRAIN, ACUTE 06/11/2009  . OTHER MALAISE AND FATIGUE 07/09/2007  . HYPERTENSION 01/28/2007  . Hypothyroidism 11/11/2006  . COMMON MIGRAINE 11/11/2006  . GERD 11/11/2006  . FIBROMYALGIA 11/11/2006    Past Surgical History:  Procedure Laterality Date  . COLONOSCOPY    . DILATION AND CURETTAGE OF UTERUS  1978  . [place]  . KNEE ARTHROSCOPY     both in past  . POLYPECTOMY    . SHOULDER OPEN ROTATOR CUFF REPAIR Left 07/07/2013  Procedure: LEFT SHOULDER MINI OPEN SUBACROMIAL DECOMPRESSION ROTATOR CUFF REPAIR AND POSSIBLE PATCH GRAFT ;  Surgeon: Johnn Hai, MD;  Location: [facility];  Service: Orthopedics;  Laterality: Left;  with interscaline block  . SKIN CANCER EXCISION Bilateral    arm, legs, and chest  . TONSILLECTOMY       OB History    Gravida  8   Para  6   Term  0   Preterm  0   AB  1   Living  6     SAB  0   TAB  0   Ectopic  1   Multiple  0   Live Births               Home Medications    Prior to Admission medications   Medication Sig Start Date End Date Taking? Authorizing Provider  Ascorbic Acid (VITAMIN C) 1000 MG tablet Take  1,000 mg by mouth daily.    [provider]  hydrochlorothiazide (HYDRODIURIL) 25 MG tablet TAKE 1 TABLET BY MOUTH EVERY DAY 05/18/18   Panosh, Standley Brooking, MD  LORazepam (ATIVAN) 1 MG tablet Take 0.5-1 tablets (0.5-1 mg total) by mouth every 8 (eight) hours as needed for anxiety. anxiety 05/19/18   Panosh, Standley Brooking, MD  MAGNESIUM CARBONATE PO Take 1 tablet by mouth daily.    [provider]  NATURE-THROID 97.5 MG TABS daily. 1 tablet 4 days a week and half tablet 3 days a week 07/17/14   [provider]  Nutritional Supplements (DHEA PO) Take 1 tablet by mouth daily. 3mg     [provider]  OVER THE COUNTER MEDICATION Essential Pro    [provider]  potassium chloride SA (KLOR-CON M20) 20 MEQ tablet Take 1 tablet (20 mEq total) by mouth daily. 04/30/18   Panosh, Standley Brooking, MD  Progesterone Micronized (PROGESTERONE PO) Take 150 mg by mouth daily.     [provider]  VITAMIN K PO Take 400 mcg by mouth daily.    [provider]    Family History Family History  Problem Relation Age of Onset  . Hypertension Mother        low borderline  . Osteoporosis Mother   . Hypertension Father   . Liver disease Father        amyloid deceased  . Hyperlipidemia Father   . Melanoma Sister   . Cancer Sister   . Juvenile Diabetes Daughter   . ADD / ADHD Child   . Hyperlipidemia Other        Maternal grandmother  . Colon cancer Neg Hx   . Stomach cancer Neg Hx   . Colon polyps Neg Hx   . Esophageal cancer Neg Hx   . Rectal cancer Neg Hx     Social History Social History   Tobacco Use  . Smoking status: Never Smoker  . Smokeless tobacco: Never Used  Substance Use Topics  . Alcohol use: Yes    Comment: rare  . Drug use: No     Allergies   Norvasc [amlodipine besylate]; Avapro [irbesartan]; Cefdinir; Penicillins; Tylenol [acetaminophen]; and Zyrtec [cetirizine]   Review of Systems Review of Systems  Constitutional: Positive for  activity change.  Eyes: Negative for visual disturbance.  Respiratory: Negative for chest tightness and shortness of breath.   Cardiovascular: Negative for chest pain and palpitations.  Neurological: Positive for dizziness and light-headedness. Negative for seizures, weakness, numbness and headaches.  All other systems reviewed and are  negative.    Physical Exam Updated Vital Signs BP 140/83   Pulse 93   Temp 98.2 F (36.8 C) (Oral)   Resp 18   Ht 5\' 6"  (1.676 m)   Wt 88.9 kg   SpO2 99%   BMI 31.64 kg/m   Physical Exam Vitals signs and nursing note reviewed.  Constitutional:      Appearance: She is well-developed.  HENT:     Head: Normocephalic and atraumatic.  Eyes:     Extraocular Movements: Extraocular movements intact.     Pupils: Pupils are equal, round, and reactive to light.     Comments: No nystagmus -mild horizontal saccadic eye movement noted  Neck:     Musculoskeletal: Normal range of motion and neck supple.  Cardiovascular:     Rate and Rhythm: Normal rate.  Pulmonary:     Effort: Pulmonary effort is normal.  Abdominal:     General: Bowel sounds are normal.  Musculoskeletal:     Right lower leg: No edema.     Left lower leg: No edema.  Skin:    General: Skin is warm and dry.  Neurological:     General: No focal deficit present.     Mental Status: She is alert and oriented to person, place, and time.     Cranial Nerves: No cranial nerve deficit.     Sensory: No sensory deficit.     Motor: No weakness.     Coordination: Coordination normal.      ED Treatments / Results  Labs (all labs ordered are listed, but only abnormal results are displayed) Labs Reviewed  BASIC METABOLIC PANEL - Abnormal; Notable for the following components:      Result Value   Potassium 3.3 (*)    All other components within normal limits  URINALYSIS, ROUTINE W REFLEX MICROSCOPIC - Abnormal; Notable for the following components:   Color, Urine STRAW (*)    All other  components within normal limits  CBC  CBG MONITORING, ED    EKG EKG Interpretation  Date/Time:  Saturday June 12 2018 22:26:12 EST Ventricular Rate:  87 PR Interval:    QRS Duration: 117 QT Interval:  399 QTC Calculation: 480 R Axis:   -33 Text Interpretation:  Sinus rhythm Incomplete RBBB and LAFB No acute changes No significant change since last tracing Confirmed by Varney Biles [phone]) on 06/12/2018 11:40:58 PM   Radiology Ct Angio Head W Or Wo Contrast  Result Date: 06/13/2018 CLINICAL DATA:  Found down, dizziness and nausea. No loss of consciousness. History of hypertension and hyperlipidemia. EXAM: CT ANGIOGRAPHY HEAD AND NECK TECHNIQUE: Multidetector CT imaging of the head and neck was performed using the standard protocol during bolus administration of intravenous contrast. Multiplanar CT image reconstructions and MIPs were obtained to evaluate the vascular anatomy. Carotid stenosis measurements (when applicable) are obtained utilizing NASCET criteria, using the distal internal carotid diameter as the denominator. CONTRAST:  169mL ISOVUE-370 IOPAMIDOL (ISOVUE-370) INJECTION 76% COMPARISON:  CT HEAD June 06, 2013 FINDINGS: CT HEAD FINDINGS BRAIN: No intraparenchymal hemorrhage, mass effect nor midline shift. No parenchymal brain volume loss for age. Stable punctate calcification at foramen of Monro. No hydrocephalus. No acute large vascular territory infarcts. No abnormal extra-axial fluid collections. Basal cisterns are patent. VASCULAR: Unremarkable. SKULL/SOFT TISSUES: No skull fracture. No significant soft tissue swelling. ORBITS/SINUSES: The included ocular globes and orbital contents are normal.Trace paranasal sinus mucosal thickening. Mastoid air cells are well aerated. OTHER: None. CTA NECK FINDINGS: AORTIC ARCH: Normal appearance  of the thoracic arch, normal branch pattern. The origins of the innominate, left Common carotid artery and subclavian artery are widely patent.  RIGHT CAROTID SYSTEM: Common carotid artery is patent. Mild intimal thickening of the carotid bifurcation without hemodynamically significant stenosis by NASCET criteria. Normal appearance of the internal carotid artery. LEFT CAROTID SYSTEM: Common carotid artery is patent. Mild intimal thickening of the carotid bifurcation without hemodynamically significant stenosis by NASCET criteria. Normal appearance of the internal carotid artery. VERTEBRAL ARTERIES:Codominant vertebral arteries. Normal appearance of the vertebral arteries, widely patent. SKELETON: No acute osseous process though bone windows have not been submitted. Moderate C4-5 degenerative disc. Moderate LEFT C4-5 neural foraminal narrowing. OTHER NECK: Soft tissues of the neck are nonacute though, not tailored for evaluation. Heterogeneous thyroid without dominant nodule. UPPER CHEST: Included lung apices are clear. No superior mediastinal lymphadenopathy. CTA HEAD FINDINGS: ANTERIOR CIRCULATION: Patent cervical internal carotid arteries, petrous, cavernous and supra clinoid internal carotid arteries. Patent anterior and middle cerebral arteries. No large vessel occlusion, significant stenosis, contrast extravasation or aneurysm. POSTERIOR CIRCULATION: Patent vertebral arteries, vertebrobasilar junction and basilar artery, as well as main branch vessels. Patent posterior cerebral arteries. Small RIGHT posterior communicating artery present. No large vessel occlusion, significant stenosis, contrast extravasation or aneurysm. VENOUS SINUSES: Major dural venous sinuses are patent though not tailored for evaluation on this angiographic examination. ANATOMIC VARIANTS: None. DELAYED PHASE: Not performed. MIP images reviewed. IMPRESSION: CT HEAD: 1. Normal CT HEAD with and without contrast. CTA NECK: 1. No acute vascular process. 2. No hemodynamically significant stenosis ICA's. Patent vertebral arteries. CTA HEAD: 1. No emergent large vessel occlusion or  flow-limiting stenosis. Electronically Signed   By: Elon Alas M.D.   On: 06/13/2018 01:48   Ct Angio Neck W And/or Wo Contrast  Result Date: 06/13/2018 CLINICAL DATA:  Found down, dizziness and nausea. No loss of consciousness. History of hypertension and hyperlipidemia. EXAM: CT ANGIOGRAPHY HEAD AND NECK TECHNIQUE: Multidetector CT imaging of the head and neck was performed using the standard protocol during bolus administration of intravenous contrast. Multiplanar CT image reconstructions and MIPs were obtained to evaluate the vascular anatomy. Carotid stenosis measurements (when applicable) are obtained utilizing NASCET criteria, using the distal internal carotid diameter as the denominator. CONTRAST:  123mL ISOVUE-370 IOPAMIDOL (ISOVUE-370) INJECTION 76% COMPARISON:  CT HEAD June 06, 2013 FINDINGS: CT HEAD FINDINGS BRAIN: No intraparenchymal hemorrhage, mass effect nor midline shift. No parenchymal brain volume loss for age. Stable punctate calcification at foramen of Monro. No hydrocephalus. No acute large vascular territory infarcts. No abnormal extra-axial fluid collections. Basal cisterns are patent. VASCULAR: Unremarkable. SKULL/SOFT TISSUES: No skull fracture. No significant soft tissue swelling. ORBITS/SINUSES: The included ocular globes and orbital contents are normal.Trace paranasal sinus mucosal thickening. Mastoid air cells are well aerated. OTHER: None. CTA NECK FINDINGS: AORTIC ARCH: Normal appearance of the thoracic arch, normal branch pattern. The origins of the innominate, left Common carotid artery and subclavian artery are widely patent. RIGHT CAROTID SYSTEM: Common carotid artery is patent. Mild intimal thickening of the carotid bifurcation without hemodynamically significant stenosis by NASCET criteria. Normal appearance of the internal carotid artery. LEFT CAROTID SYSTEM: Common carotid artery is patent. Mild intimal thickening of the carotid bifurcation without hemodynamically  significant stenosis by NASCET criteria. Normal appearance of the internal carotid artery. VERTEBRAL ARTERIES:Codominant vertebral arteries. Normal appearance of the vertebral arteries, widely patent. SKELETON: No acute osseous process though bone windows have not been submitted. Moderate C4-5 degenerative disc. Moderate LEFT C4-5 neural foraminal narrowing. OTHER  NECK: Soft tissues of the neck are nonacute though, not tailored for evaluation. Heterogeneous thyroid without dominant nodule. UPPER CHEST: Included lung apices are clear. No superior mediastinal lymphadenopathy. CTA HEAD FINDINGS: ANTERIOR CIRCULATION: Patent cervical internal carotid arteries, petrous, cavernous and supra clinoid internal carotid arteries. Patent anterior and middle cerebral arteries. No large vessel occlusion, significant stenosis, contrast extravasation or aneurysm. POSTERIOR CIRCULATION: Patent vertebral arteries, vertebrobasilar junction and basilar artery, as well as main branch vessels. Patent posterior cerebral arteries. Small RIGHT posterior communicating artery present. No large vessel occlusion, significant stenosis, contrast extravasation or aneurysm. VENOUS SINUSES: Major dural venous sinuses are patent though not tailored for evaluation on this angiographic examination. ANATOMIC VARIANTS: None. DELAYED PHASE: Not performed. MIP images reviewed. IMPRESSION: CT HEAD: 1. Normal CT HEAD with and without contrast. CTA NECK: 1. No acute vascular process. 2. No hemodynamically significant stenosis ICA's. Patent vertebral arteries. CTA HEAD: 1. No emergent large vessel occlusion or flow-limiting stenosis. Electronically Signed   By: Elon Alas M.D.   On: 06/13/2018 01:48    Procedures Procedures (including critical care time)  Medications Ordered in ED Medications  iopamidol (ISOVUE-370) 76 % injection 100 mL (100 mLs Intravenous Contrast Given 06/13/18 0119)     Initial Impression / Assessment and Plan / ED Course   I have reviewed the triage vital signs and the nursing notes.  Pertinent labs & imaging results that were available during my care of the patient were reviewed by me and considered in my medical decision making (see chart for details).  Clinical Course as of Jun 14 327  Sun Jun 13, 2018  0230 UA was unremarkable.  Electrolytes were normal. Patient did have orthostatic findings on evaluation, as her heart rate jumped by 20 beats when she stood up.  She will continue with oral hydration.   Urinalysis, Routine w reflex microscopic(!) [AN]  0230 Results from the ER workup discussed with the patient face to face and all questions answered to the best of my ability. Patient is comfortable going home.  Strict ER return precautions discussed and she will follow-up with her PCP soon as possible.  CT Angio Head W or Wo Contrast [AN]    Clinical Course User Index [AN] Varney Biles, MD    63 year old female with history of hypertension, hyperlipidemia comes in with chief complaint of near syncope.  She denies any focal neurologic symptoms, vision changes, chest pain, palpitations, shortness of breath associated with this near syncope event.  Patient has family history of brain aneurysm.  She has a headache but denies any neck stiffness or pain.  On exam patient does not have any focal neurologic deficit.  She is feeling better as far as dizziness is concerned.  Differential diagnosis for this near syncope includes arrhythmia, TIA, dehydration, electrolyte abnormalities, brain aneurysm.  Although she is slightly tachycardic, clinical concerns for PE is extremely low and we will not pursue that diagnosis at this visit.  Given that patient has family history of brain aneurysm, and she had some kind of difficulty with speech right after the event occurred -CT angiogram will be ordered to rule out any brain aneurysm or high risk findings for CVA.   Patient is under insured and is a little anxious on  the cost of her care.  After significant discussion, patient has agreed to getting CT angiogram in the ED and telemetry monitoring while in the ED but not getting admitted for further syncope-TIA type work-up she would discuss further work-up with her  PCP and return to the ER if her symptoms get worse.  Final Clinical Impressions(s) / ED Diagnoses   Final diagnoses:  Near syncope  Dizziness  Orthostatic dizziness    ED Discharge Orders    None       Varney Biles, MD 06/13/18 [id number]

## 2018-06-13 NOTE — Discharge Instructions (Addendum)
We saw in the ER for dizziness and near fainting. CT angiogram of the head and neck did not reveal any evidence of brain aneurysm or bleed.  The lab work-up here was also normal.  We did notice some evidence of dehydration, as your heart rate jumped 20 beats when you got up.  Make sure you hydrate yourself well.  Additionally, as discussed there could be arrhythmia that might have caused your symptoms.  You have preferred getting outpatient work-up for further evaluation which is fine.  Please see your primary care doctor for the next steps.  Return to the ER if you faint or start having any neurologic symptoms such as constant dizziness, vision change, one-sided weakness or numbness, slurred speech.

## 2018-06-13 NOTE — ED Notes (Signed)
Returned from CT.

## 2018-06-16 DIAGNOSIS — R42 Dizziness and giddiness: Secondary | ICD-10-CM | POA: Diagnosis not present

## 2018-06-16 DIAGNOSIS — R002 Palpitations: Secondary | ICD-10-CM | POA: Diagnosis not present

## 2018-06-17 DIAGNOSIS — R9431 Abnormal electrocardiogram [ECG] [EKG]: Secondary | ICD-10-CM | POA: Diagnosis not present

## 2018-06-17 DIAGNOSIS — I4589 Other specified conduction disorders: Secondary | ICD-10-CM | POA: Diagnosis not present

## 2018-06-22 ENCOUNTER — Other Ambulatory Visit: Payer: Self-pay

## 2018-06-22 DIAGNOSIS — R7989 Other specified abnormal findings of blood chemistry: Secondary | ICD-10-CM

## 2018-06-22 DIAGNOSIS — E039 Hypothyroidism, unspecified: Secondary | ICD-10-CM

## 2018-06-22 DIAGNOSIS — E785 Hyperlipidemia, unspecified: Secondary | ICD-10-CM

## 2018-06-22 DIAGNOSIS — H938X3 Other specified disorders of ear, bilateral: Secondary | ICD-10-CM

## 2018-06-22 DIAGNOSIS — R945 Abnormal results of liver function studies: Secondary | ICD-10-CM

## 2018-06-22 NOTE — Telephone Encounter (Signed)
Looking at the ed visits   Labs show no  Heart   Damage     But   Abnormal lfts  Of uncertain significance . Make sure you stop all supplements to make sure not effecting your liver.  Only thyroid and bp meds . ( and vit d ok)  Do not think this is causing the palpitiations .   Suggest you see cardiology again consideration of a heart monitor  To check on heart rhythym to delinate prblem or as they see fit  You have already had an echocardiogram last march .   You could see ent  For the  Congestion and to see of you could have   An inner ear  Problem menieres   But would go to  [facility] first     Plan  lfts  And tsh  Vit d level in 2-3 weeks and then ROV

## 2018-06-23 NOTE — Telephone Encounter (Signed)
Ill check again  Maybe I was incorrect

## 2018-06-29 DIAGNOSIS — H6983 Other specified disorders of Eustachian tube, bilateral: Secondary | ICD-10-CM | POA: Diagnosis not present

## 2018-06-29 DIAGNOSIS — H8110 Benign paroxysmal vertigo, unspecified ear: Secondary | ICD-10-CM | POA: Diagnosis not present

## 2018-06-29 DIAGNOSIS — J310 Chronic rhinitis: Secondary | ICD-10-CM | POA: Diagnosis not present

## 2018-06-29 NOTE — Telephone Encounter (Signed)
Not sure if this was addressed  In previous communication .    It is possible that she would benefit from seeing cardiology again for possible  rhytym monitor and or  Medication changes   Can we send her back to cardiology?

## 2018-06-29 NOTE — Telephone Encounter (Signed)
For some reason this is not seen in the lab module  On care every where but was in the  [place] of the note  But we will follow up   And not to worry I copied and pasted the abnormal  All the othertested was normal )   Total Protein 6.4 6.0 - 8.3 G/DL [place] [facility]   Albumin  4.3 3.5 - 5.0 G/DL [place] [facility]   Total Bilirubin 0.5 0.1 - 1.2 MG/DL [place] [facility]   Alkaline Phosphatase 152 (H) 25 - 125 IU/L or U/L [place] [facility]   AST (SGOT) 58 (H) 5 - 40 IU/L or U/L [place] [facility]   ALT (SGPT) 204 (H)

## 2018-06-30 NOTE — Telephone Encounter (Signed)
Ok glad you are getting a heart monitor.  Usually  Cardiology or other provider has to order the mionitor and  Follow up with the results. But I dont know who that is .  Dr Harrington Challenger would be fine but  If another cards is involved   As witht the monitor then they could also see you .    Medicines  Low potassium  Panic can all cause tingling   so hard to tell about that . I suggest see cards to  Help with bp medication advice also

## 2018-07-05 ENCOUNTER — Other Ambulatory Visit: Payer: Self-pay

## 2018-07-05 DIAGNOSIS — R55 Syncope and collapse: Secondary | ICD-10-CM

## 2018-07-07 DIAGNOSIS — R55 Syncope and collapse: Secondary | ICD-10-CM | POA: Diagnosis not present

## 2018-07-09 DIAGNOSIS — M7671 Peroneal tendinitis, right leg: Secondary | ICD-10-CM | POA: Diagnosis not present

## 2018-07-09 DIAGNOSIS — I471 Supraventricular tachycardia: Secondary | ICD-10-CM | POA: Diagnosis not present

## 2018-07-09 DIAGNOSIS — M25571 Pain in right ankle and joints of right foot: Secondary | ICD-10-CM | POA: Diagnosis not present

## 2018-07-13 ENCOUNTER — Encounter: Payer: Self-pay | Admitting: Physician Assistant

## 2018-07-13 ENCOUNTER — Ambulatory Visit: Payer: BLUE CROSS/BLUE SHIELD | Admitting: Physician Assistant

## 2018-07-13 ENCOUNTER — Other Ambulatory Visit: Payer: Self-pay | Admitting: *Deleted

## 2018-07-13 VITALS — BP 150/88 | HR 91 | Ht 66.0 in | Wt 199.0 lb

## 2018-07-13 DIAGNOSIS — F419 Anxiety disorder, unspecified: Secondary | ICD-10-CM

## 2018-07-13 DIAGNOSIS — I10 Essential (primary) hypertension: Secondary | ICD-10-CM | POA: Diagnosis not present

## 2018-07-13 DIAGNOSIS — R002 Palpitations: Secondary | ICD-10-CM

## 2018-07-13 DIAGNOSIS — I471 Supraventricular tachycardia: Secondary | ICD-10-CM

## 2018-07-13 NOTE — Patient Instructions (Signed)
Medication Instructions:  Your physician recommends that you continue on your current medications as directed. Please refer to the Current Medication list given to you today.  If you need a refill on your cardiac medications before your next appointment, please call your pharmacy.   Lab work: None ordered  If you have labs (blood work) drawn today and your tests are completely normal, you will receive your results only by: Marland Kitchen MyChart Message (if you have MyChart) OR . A paper copy in the mail If you have any lab test that is abnormal or we need to change your treatment, we will call you to review the results.  Testing/Procedures: None ordered  Follow-Up: At [facility], you and your health needs are our priority.  As part of our continuing mission to provide you with exceptional heart care, we have created designated Provider Care Teams.  These Care Teams include your primary Cardiologist (physician) and Advanced Practice Providers (APPs -  Physician Assistants and Nurse Practitioners) who all work together to provide you with the care you need, when you need it. You will need a follow up appointment in:  6 months.  Please call our office 2 months in advance to schedule this appointment.  You may see Dr. Harrington Challenger  or one of the following Advanced Practice Providers on your designated Care Team: Richardson Dopp, PA-C [place], [place] . Daune Perch, NP  Any Other Special Instructions Will Be Listed Below (If Applicable).

## 2018-07-13 NOTE — Progress Notes (Signed)
Cardiology Office Note    Date:  07/13/2018   ID:  [name], DOB 06-13-1955, MRN [id number]  PCP:  Burnis Medin, MD  Cardiologist:  Dr. Harrington Challenger Chief Complaint: Dizziness  History of Present Illness:   [name] is a 63 y.o. female HTN, HL, obesity, palpitations and thyroid disease presents for follow up.   Prior hx of palpitations due to thyroid disease. Last echo 07/2017 showed LVEF of 60-65%, grade 1 DD, small PFO. No further work up.   Seen in ER 06/13/18 with near syncope. No focal neurologic deficit.  CT angio of neck/head normal. Send home from ER.  Seen at [facility] ER 06/16/18 with similar problem.  Ruled out and sent home on 2 weeks monitor.    Seen by PCP>>LFTS was abnormal. Advised to avoid Keto diet.   Here today for follow up with monitor result.  It showed average heart rate of 78.  With maximum heart rate of 152 bpm.  2 episodes of SVT lasting for 9 beats and 11 beats respectively.  No other arrhythmia noted.  Heart rate in 80s on multiple triggered event.  Patient is very anxious at baseline.  Researching about her symptoms, looking side effect of medications and multiple other stroke.  Patient denies chest pain, shortness of breath, orthopnea, PND, syncope or lower extremity edema.    Past Medical History:  Diagnosis Date  . Anal fissure   . Atrial septal aneurysm / if pfo  echo 6 13  10/23/2011  . Cataract    both eyes  . Depression   . Fatty liver    "pre fatty liver"  . Fibromyalgia   . Headache(784.0)    hx of migraines when younger  . Heart palpitations    hx with normal holter event monitoring  . Hx: UTI (urinary tract infection)   . Hyperlipidemia   . Hypertension   . Hypothyroidism   . Obesity   . Pneumonia 1972   hx of  . Polyp of colon   . 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 Skin cancer    basal, squamous cell    Past Surgical History:  Procedure Laterality Date  . COLONOSCOPY    . DILATION AND CURETTAGE OF UTERUS  1978  .  [place]  . KNEE ARTHROSCOPY     both in past  . POLYPECTOMY    . SHOULDER OPEN ROTATOR CUFF REPAIR Left 07/07/2013   Procedure: LEFT SHOULDER MINI OPEN SUBACROMIAL DECOMPRESSION ROTATOR CUFF REPAIR AND POSSIBLE PATCH GRAFT ;  Surgeon: Johnn Hai, MD;  Location: [facility];  Service: Orthopedics;  Laterality: Left;  with interscaline block  . SKIN CANCER EXCISION Bilateral    arm, legs, and chest  . TONSILLECTOMY      Current Medications: Prior to Admission medications   Medication Sig Start Date End Date Taking? Authorizing Provider  Ascorbic Acid (VITAMIN C) 1000 MG tablet Take 1,000 mg by mouth daily.    [provider]  hydrochlorothiazide (HYDRODIURIL) 25 MG tablet TAKE 1 TABLET BY MOUTH EVERY DAY 05/18/18   Panosh, Standley Brooking, MD  LORazepam (ATIVAN) 1 MG tablet Take 0.5-1 tablets (0.5-1 mg total) by mouth every 8 (eight) hours as needed for anxiety. anxiety 05/19/18   Panosh, Standley Brooking, MD  MAGNESIUM CARBONATE PO Take 1 tablet by mouth daily.    [provider]  NATURE-THROID 97.5 MG TABS daily. 1 tablet 4 days a week and half tablet 3 days a week  07/17/14   [provider]  Nutritional Supplements (DHEA PO) Take 1 tablet by mouth daily. 3mg     [provider]  OVER THE COUNTER MEDICATION Essential Pro    [provider]  potassium chloride SA (KLOR-CON M20) 20 MEQ tablet Take 1 tablet (20 mEq total) by mouth daily. 04/30/18   Panosh, Standley Brooking, MD  Progesterone Micronized (PROGESTERONE PO) Take 150 mg by mouth daily.     [provider]  VITAMIN K PO Take 400 mcg by mouth daily.    [provider]    Allergies:   Norvasc [amlodipine besylate]; Avapro [irbesartan]; Cefdinir; Penicillins; Tylenol [acetaminophen]; and Zyrtec [cetirizine]   Social History   Socioeconomic History  . Marital status: Married    Spouse name: Not on file  . Number of children: 6  . Years of education: Not on file  . Highest  education level: Not on file  Occupational History  . Occupation: TEACHER ASST    Employer: [employer]  Social Needs  . Financial resource strain: Not on file  . Food insecurity:    Worry: Not on file    Inability: Not on file  . Transportation needs:    Medical: Not on file    Non-medical: Not on file  Tobacco Use  . Smoking status: Never Smoker  . Smokeless tobacco: Never Used  Substance and Sexual Activity  . Alcohol use: Yes    Comment: rare  . Drug use: No  . Sexual activity: Not on file  Lifestyle  . Physical activity:    Days per week: Not on file    Minutes per session: Not on file  . Stress: Not on file  Relationships  . Social connections:    Talks on phone: Not on file    Gets together: Not on file    Attends religious service: Not on file    Active member of club or organization: Not on file    Attends meetings of clubs or organizations: Not on file    Relationship status: Not on file  Other Topics Concern  . Not on file  Social History Narrative   Teachers [employer]  Not workking since last February . 15 left shoulder surgery    Married   Special educ   HH of 4   3 outside dogs, 8 goats, 40 chickens and quail. [place]   Job stresses   Ex husband passes away   Not working after injur at school  Shoulder neck                  Family History:  The patient's family history includes ADD / ADHD in her child; Cancer in her sister; Hyperlipidemia in her father and another family member; Hypertension in her father and mother; Juvenile Diabetes in her daughter; Liver disease in her father; Melanoma in her sister; Osteoporosis in her mother.  ROS:   Please see the history of present illness.    ROS All other systems reviewed and are negative.   PHYSICAL EXAM:   VS:  BP (!) 150/88   Pulse 91   Ht 5\' 6"  (1.676 m)   Wt 199 lb (90.3 kg)   SpO2 98%   BMI 32.12 kg/m    GEN: Well nourished, well developed, in no acute  distress  HEENT: normal  Neck: no JVD, carotid bruits, or masses Cardiac: RRR; no murmurs, rubs, or gallops,no edema  Respiratory:  clear  to auscultation bilaterally, normal work of breathing GI: soft, nontender, nondistended, + BS MS: no deformity or atrophy  Skin: warm and dry, no rash Neuro:  Alert and Oriented x 3, Strength and sensation are intact Psych: euthymic mood, full affect  Wt Readings from Last 3 Encounters:  07/13/18 199 lb (90.3 kg)  06/12/18 196 lb (88.9 kg)  05/19/18 198 lb 3.2 oz (89.9 kg)      Studies/Labs Reviewed:   EKG:  EKG is not ordered today.    Recent Labs: 01/14/2018: ALT 24; TSH 1.96 06/12/2018: BUN 22; Creatinine, Ser 0.68; Hemoglobin 13.1; Platelets 211; Potassium 3.3; Sodium 139   Lipid Panel    Component Value Date/Time   CHOL 262 (A) 01/14/2018   TRIG 63 01/14/2018   HDL 73 (A) 01/14/2018   CHOLHDL 4 07/23/2016 0853   VLDL 12.0 07/23/2016 0853   LDLCALC 173 01/14/2018   LDLDIRECT 165.2 12/27/2012 1355    Additional studies/ records that were reviewed today include:   Echo 07/20/2017 Study Conclusions  - Left ventricle: The cavity size was normal. Wall thickness was   increased in a pattern of mild LVH. Systolic function was normal.   The estimated ejection fraction was in the range of 60% to 65%.   Wall motion was normal; there were no regional wall motion   abnormalities. Doppler parameters are consistent with abnormal   left ventricular relaxation (grade 1 diastolic dysfunction). - Aortic valve: Transvalvular velocity was within the normal range.   There was no stenosis. There was no regurgitation. - Mitral valve: Transvalvular velocity was within the normal range.   There was no evidence for stenosis. There was trivial   regurgitation. - Right ventricle: The cavity size was normal. Wall thickness was   normal. Systolic function was normal. - Atrial septum: There was a patent foramen ovale with right to   left flow at rest on  saline microcavitation study. - Tricuspid valve: There was trivial regurgitation. - Pulmonary arteries: Systolic pressure was within the normal   range. PA peak pressure: 33 mm Hg (S).    ASSESSMENT & PLAN:    1. Anxiety/palpitation -Brief SVT episode noted on monitor.  Will send to Dr. Harrington Challenger for review.  No other arrhythmia.  I think her symptoms most likely related to underlying anxiety rather than heart disease.  Greater than 30 minutes discussion for lifestyle modification, coping with anxiety and medication.  See below.  2.  Hypertension -Minimally elevated.  Likely due to anxiety.  Currently patient taking hydrochlorothiazide 25 mg daily.  Patient denies any dyspnea or lower extremity edema.  She was minimally hypokalemic at K of 3.3.  On supplement. -I have discussed possible changing hydrochlorothiazide to Coreg lower dose.  This will help with the palpitation/ SVT and hypokalemia.  She will discuss with PCP.  3.  Transaminitis -She has stopped using keto diet and supplement.  She is due to check labs.  She will follow-up with Dr. Regis Bill.    Medication Adjustments/Labs and Tests Ordered: Current medicines are reviewed at length with the patient today.  Concerns regarding medicines are outlined above.  Medication changes, Labs and Tests ordered today are listed in the Patient Instructions below. Patient Instructions  Medication Instructions:  Your physician recommends that you continue on your current medications as directed. Please refer to the Current Medication list given to you today.  If you need a refill on your cardiac medications before your next appointment, please call your pharmacy.   Lab work:  None ordered  If you have labs (blood work) drawn today and your tests are completely normal, you will receive your results only by: Marland Kitchen MyChart Message (if you have MyChart) OR . A paper copy in the mail If you have any lab test that is abnormal or we need to change your  treatment, we will call you to review the results.  Testing/Procedures: None ordered  Follow-Up: At [facility], you and your health needs are our priority.  As part of our continuing mission to provide you with exceptional heart care, we have created designated Provider Care Teams.  These Care Teams include your primary Cardiologist (physician) and Advanced Practice Providers (APPs -  Physician Assistants and Nurse Practitioners) who all work together to provide you with the care you need, when you need it. You will need a follow up appointment in:  6 months.  Please call our office 2 months in advance to schedule this appointment.  You may see Dr. Harrington Challenger  or one of the following Advanced Practice Providers on your designated Care Team: Richardson Dopp, PA-C [place], [place] . Daune Perch, NP  Any Other Special Instructions Will Be Listed Below (If Applicable).       Jarrett Soho, [place]  07/13/2018 11:21 AM    [facility] [place], [place], [place]  [address] Phone: [phone]; Fax: [phone]

## 2018-08-04 DIAGNOSIS — R5383 Other fatigue: Secondary | ICD-10-CM | POA: Diagnosis not present

## 2018-08-04 DIAGNOSIS — E039 Hypothyroidism, unspecified: Secondary | ICD-10-CM | POA: Diagnosis not present

## 2018-08-04 DIAGNOSIS — M797 Fibromyalgia: Secondary | ICD-10-CM | POA: Diagnosis not present

## 2018-08-04 DIAGNOSIS — J309 Allergic rhinitis, unspecified: Secondary | ICD-10-CM | POA: Diagnosis not present

## 2018-08-05 NOTE — Telephone Encounter (Signed)
I think she saw dr Fuller Plan  See my other message about futher labs

## 2018-08-05 NOTE — Telephone Encounter (Signed)
Please send her this message   Apologies for later reply I was out of office for 10 days    reviewed your record for any missing  Loose ends  Cannot make direct linkg of sx in you situation but we need to check  Some labs   That I dont see  Resulted in  Your record   Iron studies  VIt b12 level Repeat  anti smooth muscle aby( borderline a few years ago when you saw specialist  ) to make sure still ok  With antilkm-1aby  Celiac panel  Hep B core igg  And ag  ( your hep c  Was negative but couldn't find  Results of hep b screen even if you ahd vaccine)  If  She agrees to proceed  With this in addition to  Previous labs  [place] I can  place future orders   And tell her not sure of cost    If concern about cost  Then  would at least do  The iron studies b12 and hep b cor and ag

## 2018-08-05 NOTE — Telephone Encounter (Signed)
See the most recent  Message answer

## 2018-10-20 ENCOUNTER — Other Ambulatory Visit: Payer: Self-pay | Admitting: Internal Medicine

## 2018-10-20 DIAGNOSIS — J309 Allergic rhinitis, unspecified: Secondary | ICD-10-CM | POA: Diagnosis not present

## 2018-10-20 DIAGNOSIS — E039 Hypothyroidism, unspecified: Secondary | ICD-10-CM | POA: Diagnosis not present

## 2018-10-20 DIAGNOSIS — R5383 Other fatigue: Secondary | ICD-10-CM | POA: Diagnosis not present

## 2018-10-20 DIAGNOSIS — M797 Fibromyalgia: Secondary | ICD-10-CM | POA: Diagnosis not present

## 2018-10-22 DIAGNOSIS — M545 Low back pain: Secondary | ICD-10-CM | POA: Diagnosis not present

## 2018-11-11 ENCOUNTER — Other Ambulatory Visit: Payer: Self-pay | Admitting: Internal Medicine

## 2018-12-17 ENCOUNTER — Other Ambulatory Visit: Payer: Self-pay

## 2018-12-17 ENCOUNTER — Emergency Department (HOSPITAL_BASED_OUTPATIENT_CLINIC_OR_DEPARTMENT_OTHER)
Admission: EM | Admit: 2018-12-17 | Discharge: 2018-12-17 | Disposition: A | Discharge status: Home / Self Care | Payer: BC Managed Care – PPO | Attending: Emergency Medicine | Admitting: Emergency Medicine

## 2018-12-17 ENCOUNTER — Ambulatory Visit: Payer: Self-pay | Admitting: Internal Medicine

## 2018-12-17 ENCOUNTER — Encounter (HOSPITAL_BASED_OUTPATIENT_CLINIC_OR_DEPARTMENT_OTHER): Payer: Self-pay | Admitting: Emergency Medicine

## 2018-12-17 DIAGNOSIS — E039 Hypothyroidism, unspecified: Secondary | ICD-10-CM | POA: Insufficient documentation

## 2018-12-17 DIAGNOSIS — R000 Tachycardia, unspecified: Secondary | ICD-10-CM | POA: Insufficient documentation

## 2018-12-17 DIAGNOSIS — N3000 Acute cystitis without hematuria: Secondary | ICD-10-CM | POA: Diagnosis not present

## 2018-12-17 DIAGNOSIS — Z85828 Personal history of other malignant neoplasm of skin: Secondary | ICD-10-CM | POA: Insufficient documentation

## 2018-12-17 DIAGNOSIS — I10 Essential (primary) hypertension: Secondary | ICD-10-CM | POA: Insufficient documentation

## 2018-12-17 DIAGNOSIS — Z79899 Other long term (current) drug therapy: Secondary | ICD-10-CM | POA: Diagnosis not present

## 2018-12-17 LAB — URINALYSIS, ROUTINE W REFLEX MICROSCOPIC
Bilirubin Urine: NEGATIVE
Glucose, UA: NEGATIVE mg/dL
Ketones, ur: 40 mg/dL — AB
Nitrite: NEGATIVE
Protein, ur: NEGATIVE mg/dL
Specific Gravity, Urine: 1.015 (ref 1.005–1.030)
pH: 7 (ref 5.0–8.0)

## 2018-12-17 LAB — BASIC METABOLIC PANEL
Anion gap: 12 (ref 5–15)
BUN: 12 mg/dL (ref 8–23)
CO2: 24 mmol/L (ref 22–32)
Calcium: 9.4 mg/dL (ref 8.9–10.3)
Chloride: 101 mmol/L (ref 98–111)
Creatinine, Ser: 0.7 mg/dL (ref 0.44–1.00)
GFR calc Af Amer: >60 mL/min (ref >60)
GFR calc non Af Amer: >60 mL/min (ref >60)
Glucose, Bld: 105 mg/dL — ABNORMAL HIGH (ref 70–99)
Potassium: 3.5 mmol/L (ref 3.5–5.1)
Sodium: 137 mmol/L (ref 135–145)

## 2018-12-17 LAB — URINALYSIS, MICROSCOPIC (REFLEX)

## 2018-12-17 MED ORDER — NITROFURANTOIN MONOHYD MACRO 100 MG PO CAPS: 100.0000 mg | ORAL_CAPSULE | Freq: Two times a day (BID) | ORAL | 0 refills | Status: DC

## 2018-12-17 MED ORDER — PHENAZOPYRIDINE HCL 200 MG PO TABS: 200.0000 mg | ORAL_TABLET | Freq: Three times a day (TID) | ORAL | 0 refills | Status: DC

## 2018-12-17 NOTE — ED Triage Notes (Signed)
Hypertension and tachycardia at home since yesterday. No untoward sx.

## 2018-12-17 NOTE — ED Notes (Signed)
PA student at bedside.

## 2018-12-17 NOTE — Discharge Instructions (Signed)
Please read and follow all provided instructions.  Your diagnoses today include:  1. Acute cystitis without hematuria   2. Essential hypertension     Tests performed today include:  Urine test - suggests that you have an infection in your bladder   Electrolytes including potassium - were normal  Vital signs. See below for your results today.   Medications prescribed:   Macrobid - antibiotic for urinary tract infection  You have been prescribed an antibiotic medicine: take the entire course of medicine even if you are feeling better. Stopping early can cause the antibiotic not to work.   Pyridium - medication for urinary tract infection symptoms.   This medication will turn your urine orange. This is normal.   Home care instructions:  Follow any educational materials contained in this packet.  Follow-up instructions: Please follow-up with your primary care provider in 3 days if symptoms are not resolved for further evaluation of your symptoms.  Return instructions:   Please return to the Emergency Department if you experience worsening symptoms.   Return with fever, worsening pain, persistent vomiting, worsening pain in your back.   Please return if you have any other emergent concerns.  Additional Information:  Your vital signs today were: BP (!) 159/89 (BP Location: Right Arm)    Pulse (!) 118    Temp 99.3 F (37.4 C) (Oral)    Resp 20    Ht 5\' 6"  (1.676 m)    Wt 88.6 kg    SpO2 95%    BMI 31.54 kg/m  If your blood pressure (BP) was elevated above 135/85 this visit, please have this repeated by your doctor within one month. --------------

## 2018-12-17 NOTE — ED Provider Notes (Signed)
[facility] EMERGENCY DEPARTMENT Provider Note   CSN: [id number] Arrival date & time: 12/17/18  1521     History   Chief Complaint Chief Complaint  Patient presents with  . Hypertension    HPI [name] is a 63 y.o. female.     Patient with history of hypothyroidism, anxiety, hypertension on HCTZ and potassium --presents to the emergency department with complaint of elevated blood pressure readings as well as fast heart rate.  Patient states that she checks her blood pressure regularly.  Her blood pressures were running high last night.  Patient checked them again today and they were very high into the 170-180 range with a fast heart rate.  This made her anxious and worried prompting emergency department visit.  She states that her blood pressure does not typically get this high, even on a bad day.  She reports compliance with her medications.  No recent changes to her thyroid medications.  She denies severe headache or strokelike symptoms, vision loss or change, chest pain, or shortness of breath.  She does report some increased urinary frequency and history of 1-2 UTIs in the past.  No abdominal pain, vomiting, or diarrhea.  Onset of symptoms acute.  Course is constant.     Past Medical History:  Diagnosis Date  . Anal fissure   . Atrial septal aneurysm / if pfo  echo 6 13  10/23/2011  . Cataract    both eyes  . Depression   . Fatty liver    "pre fatty liver"  . Fibromyalgia   . Headache(784.0)    hx of migraines when younger  . Heart palpitations    hx with normal holter event monitoring  . Hx: UTI (urinary tract infection)   . Hyperlipidemia   . Hypertension   . Hypothyroidism   . Obesity   . Pneumonia 1972   hx of  . Polyp of colon   . 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 Skin cancer    basal, squamous cell    Patient Active Problem List   Diagnosis Date Noted  . Hepatic steatosis 11/27/2014  . Hx of adenomatous colonic polyps 11/27/2014  .  Visit for preventive health examination 03/28/2014  . Hyperlipidemia 03/28/2014  . Left rotator cuff tear 07/07/2013  . Tear of left rotator cuff 07/07/2013  . Rhinitis 06/01/2013  . Chest wall deformity 04/12/2012  . Atrial septal aneurysm / if pfo  echo 6 13  10/23/2011  . OSA (obstructive sleep apnea) 11/27/2010  . Dyslipidemia 11/01/2010  . [place]ushing 08/29/2010  . Labile hypertension 08/29/2010  . MUSCLE CRAMPS, FOOT 06/10/2010  . OTHER SLEEP DISTURBANCES 06/10/2010  . ANXIETY, SITUATIONAL 05/08/2010  . VERTIGO, POSITIONAL 03/15/2010  . VITAMIN D DEFICIENCY 02/18/2010  . OBESITY 02/18/2010  . ALLERGIC RHINITIS 02/18/2010  . PLANTAR FASCIITIS 02/18/2010  . TWITCHING 06/11/2009  . NUMBNESS, HAND 06/11/2009  . CERVICAL STRAIN, ACUTE 06/11/2009  . OTHER MALAISE AND FATIGUE 07/09/2007  . HYPERTENSION 01/28/2007  . Hypothyroidism 11/11/2006  . COMMON MIGRAINE 11/11/2006  . GERD 11/11/2006  . FIBROMYALGIA 11/11/2006    Past Surgical History:  Procedure Laterality Date  . COLONOSCOPY    . DILATION AND CURETTAGE OF UTERUS  1978  . [place]  . KNEE ARTHROSCOPY     both in past  . POLYPECTOMY    . SHOULDER OPEN ROTATOR CUFF REPAIR Left 07/07/2013   Procedure: LEFT SHOULDER MINI OPEN SUBACROMIAL DECOMPRESSION ROTATOR CUFF REPAIR AND POSSIBLE PATCH  GRAFT ;  Surgeon: Johnn Hai, MD;  Location: [facility];  Service: Orthopedics;  Laterality: Left;  with interscaline block  . SKIN CANCER EXCISION Bilateral    arm, legs, and chest  . TONSILLECTOMY       OB History    Gravida  8   Para  6   Term  0   Preterm  0   AB  1   Living  6     SAB  0   TAB  0   Ectopic  1   Multiple  0   Live Births               Home Medications    Prior to Admission medications   Medication Sig Start Date End Date Taking? Authorizing Provider  Ascorbic Acid (VITAMIN C) 1000 MG tablet Take 1,000 mg by mouth daily.    [provider]  fluticasone  (FLONASE) 50 MCG/ACT nasal spray Place into the nose. 06/29/18 06/29/19  [provider]  hydrochlorothiazide (HYDRODIURIL) 25 MG tablet TAKE 1 TABLET BY MOUTH EVERY DAY 11/15/18   Panosh, Standley Brooking, MD  LORazepam (ATIVAN) 1 MG tablet Take 0.5-1 tablets (0.5-1 mg total) by mouth every 8 (eight) hours as needed for anxiety. anxiety 05/19/18   Panosh, Standley Brooking, MD  MAGNESIUM CARBONATE PO Take 1 tablet by mouth daily.    [provider]  NATURE-THROID 97.5 MG TABS daily. 1 tablet 4 days a week and half tablet 3 days a week 07/17/14   [provider]  nitrofurantoin, macrocrystal-monohydrate, (MACROBID) 100 MG capsule Take 1 capsule (100 mg total) by mouth 2 (two) times daily. 12/17/18   Carlisle Cater, PA-C  OVER THE COUNTER MEDICATION Essential Pro    [provider]  OVER THE COUNTER MEDICATION Take 2 capsules by mouth daily. iflamed    [provider]  potassium chloride SA (K-DUR) 20 MEQ tablet TAKE ONE TABLET BY MOUTH ONE TIME DAILY  10/20/18   Panosh, Standley Brooking, MD    Family History Family History  Problem Relation Age of Onset  . Hypertension Mother        low borderline  . Osteoporosis Mother   . Hypertension Father   . Liver disease Father        amyloid deceased  . Hyperlipidemia Father   . Melanoma Sister   . Cancer Sister   . Juvenile Diabetes Daughter   . ADD / ADHD Child   . Hyperlipidemia Other        Maternal grandmother  . Colon cancer Neg Hx   . Stomach cancer Neg Hx   . Colon polyps Neg Hx   . Esophageal cancer Neg Hx   . Rectal cancer Neg Hx     Social History Social History   Tobacco Use  . Smoking status: Never Smoker  . Smokeless tobacco: Never Used  Substance Use Topics  . Alcohol use: Yes    Comment: rare  . Drug use: No     Allergies   Norvasc [amlodipine besylate], Avapro [irbesartan], Cefdinir, Penicillins, Tylenol [acetaminophen], and Zyrtec [cetirizine]   Review of Systems Review of Systems  Constitutional:  Negative for fever.  HENT: Negative for rhinorrhea and sore throat.   Eyes: Negative for redness and visual disturbance.  Respiratory: Negative for cough and shortness of breath.   Cardiovascular: Positive for palpitations (When heart is beating fast). Negative for chest pain.  Gastrointestinal: Negative for abdominal pain, diarrhea, nausea and vomiting.  Genitourinary:  Positive for frequency. Negative for dysuria.  Musculoskeletal: Negative for myalgias.  Skin: Negative for rash.  Neurological: Negative for headaches.     Physical Exam Updated Vital Signs BP (!) 159/89 (BP Location: Right Arm)   Pulse (!) 118   Temp 99.3 F (37.4 C) (Oral)   Resp 20   Ht 5\' 6"  (1.676 m)   Wt 88.6 kg   SpO2 95%   BMI 31.54 kg/m   Physical Exam Vitals signs and nursing note reviewed.  Constitutional:      Appearance: She is well-developed.  HENT:     Head: Normocephalic and atraumatic.  Eyes:     General:        Right eye: No discharge.        Left eye: No discharge.     Conjunctiva/sclera: Conjunctivae normal.  Neck:     Musculoskeletal: Normal range of motion and neck supple.  Cardiovascular:     Rate and Rhythm: Normal rate and regular rhythm.     Heart sounds: Normal heart sounds.  Pulmonary:     Effort: Pulmonary effort is normal.     Breath sounds: Normal breath sounds.  Abdominal:     Palpations: Abdomen is soft.     Tenderness: There is no abdominal tenderness. There is no guarding or rebound.  Skin:    General: Skin is warm and dry.  Neurological:     Mental Status: She is alert.  Psychiatric:        Mood and Affect: Mood is anxious.      ED Treatments / Results  Labs (all labs ordered are listed, but only abnormal results are displayed) Labs Reviewed  BASIC METABOLIC PANEL - Abnormal; Notable for the following components:      Result Value   Glucose, Bld 105 (*)    All other components within normal limits  URINALYSIS, ROUTINE W REFLEX MICROSCOPIC - Abnormal;  Notable for the following components:   Hgb urine dipstick TRACE (*)    Ketones, ur 40 (*)    Leukocytes,Ua LARGE (*)    All other components within normal limits  URINALYSIS, MICROSCOPIC (REFLEX) - Abnormal; Notable for the following components:   Bacteria, UA MANY (*)    All other components within normal limits    EKG EKG Interpretation  Date/Time:  Friday December 17 2018 15:59:55 EDT Ventricular Rate:  103 PR Interval:    QRS Duration: 102 QT Interval:  346 QTC Calculation: 453 R Axis:   -87 Text Interpretation:  Sinus tachycardia Left anterior fascicular block Abnormal R-wave progression, late transition Confirmed by Davonna Belling [phone]) on 12/17/2018 4:17:29 PM   Radiology No results found.  Procedures Procedures (including critical care time)  Medications Ordered in ED Medications - No data to display   Initial Impression / Assessment and Plan / ED Course  I have reviewed the triage vital signs and the nursing notes.  Pertinent labs & imaging results that were available during my care of the patient were reviewed by me and considered in my medical decision making (see chart for details).        Patient seen and examined.  EKG personally reviewed showing sinus tachycardia with left axis deviation.  No other arrhythmia.  Patient's vital signs are improving during ED stay spontaneously.  Will check UA and BMP given patient's history.  Vital signs reviewed and are as follows: BP 139/74   Pulse 93   Temp 99.3 F (37.4 C) (Oral)   Resp 18  Ht 5\' 6"  (1.676 m)   Wt 88.6 kg   SpO2 96%   BMI 31.54 kg/m   Potassium is normal.  UA with questionable signs of UTI.  Given the patient's symptoms, feel that it was appropriate to treat with Macrobid and Pyridium.  No indications for further work-up or evaluation at this point.  Patient is in agreement.  She is comfortable with discharged home at this time with close PCP follow-up to address any persistent symptoms.   Encouraged return with chest pain, shortness of breath, new symptoms or other concerns.  Final Clinical Impressions(s) / ED Diagnoses   Final diagnoses:  Acute cystitis without hematuria  Essential hypertension   Patient presents the emergency department today with elevated blood pressure readings, likely transient.  Patient also has anxiety which is likely contributing.  Her blood pressure and heart rate have improved spontaneously here in emergency department.  She does not have any other symptoms concerning for thyrotoxicosis or pulmonary embolism.  She does not have any other infectious symptoms today.  Overall she looks very well but anxious.  No indications for cardiac work-up tonight.  Patient will be discharged home with PCP follow-up.   ED Discharge Orders         Ordered    nitrofurantoin, macrocrystal-monohydrate, (MACROBID) 100 MG capsule  2 times daily     12/17/18 1730           Carlisle Cater, Hershal Coria 12/17/18 Toni Amend, MD 12/18/18 [phone]

## 2018-12-17 NOTE — Telephone Encounter (Signed)
Pt called in concerned with her elevated BP and 5 lb wt loss overnight.   "I was up all night long peeing."  My wt went from 197 to 193 over night.    "I'm concerned about my electrolytes being off due to all the urination".   She also felt her heart pounding in her chest at one point.  I called Dr. Velora Mediate office and spoke with [facility].   They did not have any availability.     I called the [facility] to see if they had any appts for today.   I spoke with [place] and they do not have any openings either.  Pt lives in [place].   I let her know [place] has an urgent care in [place] because she said they live equally between [place], [place], [place] so it didn't matter which urgent care as far as distant it was all the same roughly. I gave her the address for the [facility] in [place].   She and her husband decided they will go there because she feels she needs to be seen today especially for her electrolytes.    Reason for Disposition . Systolic BP  >= 709 OR Diastolic >= 628  Answer Assessment - Initial Assessment Questions 1. BLOOD PRESSURE: "What is the blood pressure?" "Did you take at least two measurements 5 minutes apart?"     I lost 5 lbs since last night.My wt 197 to 193.    I peed a lot last night. 2. ONSET: "When did you take your blood pressure?"     134/87  P-95 now and says it's irregular.    This morning it was 184/103 P-147,  147/103  P-134 and irregular.    I could  3. HOW: "How did you obtain the blood pressure?" (e.g., visiting nurse, automatic home BP monitor)     BP machine at home.   4. HISTORY: "Do you have a history of high blood pressure?"     Borderline hypertension.   5. MEDICATIONS: "Are you taking any medications for blood pressure?" "Have you missed any doses recently?"     Valsartan I was on but I stopped it back when they found out it had cancer causing chemicals in them.  I'm HCTZ 12.5 mg now.        6. OTHER SYMPTOMS: "Do you have any symptoms?" (e.g., headache, chest pain, blurred vision, difficulty breathing, weakness)     I've lost 5 lbs since last night.    7. PREGNANCY: "Is there any chance you are pregnant?" "When was your last menstrual period?"     N/A due to age.  Protocols used: HIGH BLOOD PRESSURE-A-AH

## 2018-12-22 ENCOUNTER — Telehealth: Payer: Self-pay | Admitting: Internal Medicine

## 2018-12-22 NOTE — Telephone Encounter (Signed)
So urine could have shown UTI but I dont see a culture test done to confrim.   bp in ed was ok  Maybe up from stsress and  Symptoms?  Your chemistry test was fine .   I suggest take the antibiotic and then we   Get a  FU urine test  With micro  a week later   and  OV or virtual visit   FYI there are lab orders in system  for vit d tsh and  Liver function  That are pending

## 2018-12-22 NOTE — Telephone Encounter (Signed)
Copied from [place] [id number]. Topic: Appointment Scheduling - Scheduling Inquiry for Clinic >> Dec 22, 2018  8:35 AM Richardo Priest, NT wrote: Reason for CRM: Patient is needing an ED F/U visit for her high blood pressure. Patient stated highest before medication was 184/104. Lowest was most recently at 134/86. Please advise. Call back is [phone].

## 2018-12-23 ENCOUNTER — Encounter: Payer: Self-pay | Admitting: Internal Medicine

## 2018-12-23 ENCOUNTER — Telehealth (INDEPENDENT_AMBULATORY_CARE_PROVIDER_SITE_OTHER): Payer: BC Managed Care – PPO | Admitting: Internal Medicine

## 2018-12-23 ENCOUNTER — Telehealth: Payer: Self-pay | Admitting: Internal Medicine

## 2018-12-23 ENCOUNTER — Other Ambulatory Visit: Payer: Self-pay

## 2018-12-23 DIAGNOSIS — R350 Frequency of micturition: Secondary | ICD-10-CM

## 2018-12-23 DIAGNOSIS — Z79899 Other long term (current) drug therapy: Secondary | ICD-10-CM | POA: Diagnosis not present

## 2018-12-23 DIAGNOSIS — I471 Supraventricular tachycardia, unspecified: Secondary | ICD-10-CM

## 2018-12-23 DIAGNOSIS — F419 Anxiety disorder, unspecified: Secondary | ICD-10-CM

## 2018-12-23 DIAGNOSIS — R202 Paresthesia of skin: Secondary | ICD-10-CM

## 2018-12-23 DIAGNOSIS — I10 Essential (primary) hypertension: Secondary | ICD-10-CM

## 2018-12-23 NOTE — Progress Notes (Signed)
Virtual Visit via Video Note  I connected with@ on 12/23/18 at 11:00 AM EDT by a video enabled telemedicine application and verified that I am speaking with the correct person using two identifiers. Location patient: home Location provider:work or home office Persons participating in the virtual visit: patient, provider  [place] national recommendations  regarding COVID 19 pandemic   video visit is advised over in office visit for this patient.  Patient aware  of the limitations of evaluation and management by telemedicine and  availability of in person appointments. and agreed to proceed.   HPI: [name] presents for video visit Follow-up of the ED visit when she presented with high blood pressure and history of tachycardia up to 148 that it improved and urinalysis with bacteriuria hematuria and pyuria.  She was treated for UTI felt to not have an acute cardiac event and blood pressure decreased at the end of the visit.  She has questions about a number of things.  She has ongoing urinary frequency for a while is on a high fluid intake and somewhat of a ketotic diet did not have dysuria or fever was treated with Macrobid but no culture done.  Blood pressures running higher than normal when high 130s and 140s.  She has had different episodes of rapid heart rate and anxiety had a cardiac monitor done through [facility] that showed small amount of SVT without symptoms at that time.  She also does get some anxiety panic attacks that slowly go away.  Has no syncope or shortness of breath.  She is seeing a Social worker for anxiety and stress.  Has not taken any lorazepam as needed prefers limited to no medicine.  She is having lower extremity tingling when she lays down both legs and some low back pain aching at different times.  She is waiting to get on Medicare to get an MRI of her back she is seeing Dr. Rolena Infante.  There is no weakness in her legs or severe pain.  No unusual  incontinence.       ROS: See pertinent positives and negatives per HPI.  As per HPI did have a temp of 99.1 when she went to the ED.  She just finished Macrobid.  Past Medical History:  Diagnosis Date  . Anal fissure   . Atrial septal aneurysm / if pfo  echo 6 13  10/23/2011  . Cataract    both eyes  . Depression   . Fatty liver    "pre fatty liver"  . Fibromyalgia   . Headache(784.0)    hx of migraines when younger  . Heart palpitations    hx with normal holter event monitoring  . Hx: UTI (urinary tract infection)   . Hyperlipidemia   . Hypertension   . Hypothyroidism   . Obesity   . Pneumonia 1972   hx of  . Polyp of colon   . 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 Skin cancer    basal, squamous cell    Past Surgical History:  Procedure Laterality Date  . COLONOSCOPY    . DILATION AND CURETTAGE OF UTERUS  1978  . [place]  . KNEE ARTHROSCOPY     both in past  . POLYPECTOMY    . SHOULDER OPEN ROTATOR CUFF REPAIR Left 07/07/2013   Procedure: LEFT SHOULDER MINI OPEN SUBACROMIAL DECOMPRESSION ROTATOR CUFF REPAIR AND POSSIBLE PATCH GRAFT ;  Surgeon: Johnn Hai, MD;  Location: [facility];  Service: Orthopedics;  Laterality: Left;  with interscaline block  . SKIN CANCER EXCISION Bilateral    arm, legs, and chest  . TONSILLECTOMY      Family History  Problem Relation Age of Onset  . Hypertension Mother        low borderline  . Osteoporosis Mother   . Hypertension Father   . Liver disease Father        amyloid deceased  . Hyperlipidemia Father   . Melanoma Sister   . Cancer Sister   . Juvenile Diabetes Daughter   . ADD / ADHD Child   . Hyperlipidemia Other        Maternal grandmother  . Colon cancer Neg Hx   . Stomach cancer Neg Hx   . Colon polyps Neg Hx   . Esophageal cancer Neg Hx   . Rectal cancer Neg Hx     Social History   Tobacco Use  . Smoking status: Never Smoker  . Smokeless tobacco: Never Used  Substance Use Topics   . Alcohol use: Yes    Comment: rare  . Drug use: No      Current Outpatient Medications:  .  Ascorbic Acid (VITAMIN C) 1000 MG tablet, Take 1,000 mg by mouth daily., Disp: , Rfl:  .  fluticasone (FLONASE) 50 MCG/ACT nasal spray, Place into the nose., Disp: , Rfl:  .  hydrochlorothiazide (HYDRODIURIL) 25 MG tablet, TAKE 1 TABLET BY MOUTH EVERY DAY, Disp: 90 tablet, Rfl: 1 .  LORazepam (ATIVAN) 1 MG tablet, Take 0.5-1 tablets (0.5-1 mg total) by mouth every 8 (eight) hours as needed for anxiety. anxiety, Disp: 20 tablet, Rfl: 0 .  MAGNESIUM CARBONATE PO, Take 1 tablet by mouth daily., Disp: , Rfl:  .  NATURE-THROID 97.5 MG TABS, daily. 1 tablet 4 days a week and half tablet 3 days a week, Disp: , Rfl:  .  nitrofurantoin, macrocrystal-monohydrate, (MACROBID) 100 MG capsule, Take 1 capsule (100 mg total) by mouth 2 (two) times daily., Disp: 10 capsule, Rfl: 0 .  OVER THE COUNTER MEDICATION, Essential Pro, Disp: , Rfl:  .  OVER THE COUNTER MEDICATION, Take 2 capsules by mouth daily. iflamed, Disp: , Rfl:  .  phenazopyridine (PYRIDIUM) 200 MG tablet, Take 1 tablet (200 mg total) by mouth 3 (three) times daily., Disp: 6 tablet, Rfl: 0 .  potassium chloride SA (K-DUR) 20 MEQ tablet, TAKE ONE TABLET BY MOUTH ONE TIME DAILY , Disp: 90 tablet, Rfl: 0  EXAM: BP Readings from Last 3 Encounters:  12/17/18 139/74  07/13/18 (!) 150/88  06/13/18 140/83   Wt Readings from Last 3 Encounters:  12/17/18 195 lb 6.4 oz (88.6 kg)  07/13/18 199 lb (90.3 kg)  06/12/18 196 lb (88.9 kg)     VITALS per patient if applicable:  GENERAL: alert, oriented, appears well and in no acute distress HEENT: atraumatic, conjunttiva clear, no obvious abnormalities on inspection of external nose and ears NECK: normal movements of the head and neck LUNGS: on inspection no signs of respiratory distress, breathing rate appears normal, no obvious gross SOB, gasping or wheezing CV: no obvious cyanosis MS: moves all visible  extremities without noticeable abnormality PSYCH/NEURO: pleasant and cooperative, no obvious depression or anxiety, speech and thought processing grossly intact Lab Results  Component Value Date   WBC 9.1 06/12/2018   HGB 13.1 06/12/2018   HCT 41.1 06/12/2018   PLT 211 06/12/2018   GLUCOSE 105 (H) 12/17/2018   CHOL 262 (A) 01/14/2018   TRIG 63 01/14/2018  HDL 73 (A) 01/14/2018   LDLDIRECT 165.2 12/27/2012   LDLCALC 173 01/14/2018   ALT 24 01/14/2018   AST 21 01/14/2018   NA 137 12/17/2018   K 3.5 12/17/2018   CL 101 12/17/2018   CREATININE 0.70 12/17/2018   BUN 12 12/17/2018   CO2 24 12/17/2018   TSH 1.96 01/14/2018   HGBA1C 5.2 06/01/2013    ASSESSMENT AND PLAN:  Discussed the following assessment and plan:   ICD-10-CM   1. Urinary frequency  R35.0 Urinalysis with Reflex Microscopic    Urine Culture   rx for uti plan fu ua etc   2. Medication management  Z79.899   3. Paroxysmal supraventricular tachycardia (HCC)  I47.1   4. Anxiety  F41.9   5. Tingling in extremities  R20.2    back ache  following up with dr Lanice Schwab   6. Essential hypertension  I10      Discussed findings of urinalysis no culture done in 1 week get urinalysis with micro and urine culture at [facility] lab to ensure resolution of problem. Discussed at length that I think she is having anxiety and at times runs of SVT that are associated with higher heart rate.  We discussed the difference. Agree with continued anxiety counseling she mentioned that her counselor uses clonidine. Consideration if she would benefit from as needed beta-blocker if gets episode of tachycardia.  She is due for follow-up with Dr. Harrington Challenger and she will make appointment and I will send her information for opinion.  In regard to her tingling does not seem related to her urinary function but she is set up with a spinal surgeon and an MRI in another month.  And to keep [place] informed. Counseled.  Anxiety vs svt and can get triggers   She has a  hx of both.  Expectant management and discussion of plan and treatment with opportunity to ask questions and all were answered. The patient agreed with the plan and demonstrated an understanding of the instructions.   Advised to call back or seek an in-person evaluation if worsening  or having  further concerns . In interim  I provided25  minutes of non-face-to-face time during this encounter. Most counseling    Shanon Ace, MD

## 2018-12-23 NOTE — Telephone Encounter (Signed)
Spoke with pt and she is seeking a sooner appt than available. She has recently had more episodes, of what she believes, is SVT. During these episodes, she also has HTN. Recently on 8/07, her HR was 148bpm with BP 184/104. Her PCP has suggested she begin a BB, but does not want to start one personally. She would prefer Dr. Harrington Challenger address this.  I advised pt I would forward message to Dr. Alan Ripper RN for review within the next week. She verbalized understanding and had no additional questions.

## 2018-12-23 NOTE — Progress Notes (Signed)
Reviewed note by Idell Pickles WOuld recomm setting patient up with event monitor

## 2018-12-23 NOTE — Telephone Encounter (Signed)
Pt is scheduled for ed follow up today

## 2018-12-23 NOTE — Telephone Encounter (Signed)
New message    Pt c/o BP issue: STAT if pt c/o blurred vision, one-sided weakness or slurred speech  1. What are your last 5 BP readings? 184/104  148 hr on 12/17/18   2. Are you having any other symptoms (ex. Dizziness, headache, blurred vision, passed out)? No   3. What is your BP issue? Patient b/p was elevated.

## 2018-12-23 NOTE — Telephone Encounter (Signed)
Pt had virtual visit today. 

## 2018-12-24 ENCOUNTER — Telehealth: Payer: Self-pay | Admitting: Radiology

## 2018-12-24 NOTE — Addendum Note (Signed)
Addended by: Devra Dopp E on: 12/24/2018 09:17 AM   Modules accepted: Orders

## 2018-12-24 NOTE — Progress Notes (Signed)
Pt aware of recommendations and order entered ./cy

## 2018-12-24 NOTE — Telephone Encounter (Signed)
Follow up:    Patient calling for you to send monitor it's free. If any question please call patient.

## 2018-12-24 NOTE — Telephone Encounter (Signed)
Patient is going to call [employer] to get a quote on how much the monitor will cost. She will have medicaid come Sept 1st and might decided to wait until then to start the monitor. She was instructed to call me back and let me know what she diecides

## 2018-12-24 NOTE — Telephone Encounter (Signed)
Enrolled patient for a [employer] Event monitor to be mailed. Brief instructions were gone over with the patients and she knows to expect the monitor to arrive in 3-4 days.

## 2018-12-27 NOTE — Telephone Encounter (Signed)
Event monitor has been mailed to patient per notes 12/24/18. Called patient and left message for her to call back.   There is a hold spot at 4pm on Oct 1 that she can be placed in.

## 2018-12-27 NOTE — Telephone Encounter (Signed)
Follow up    Patient returned call. She has been scheduled to see Dr. Harrington Challenger on 10/1 at 4pm.

## 2018-12-29 ENCOUNTER — Ambulatory Visit (INDEPENDENT_AMBULATORY_CARE_PROVIDER_SITE_OTHER): Payer: BC Managed Care – PPO

## 2018-12-29 DIAGNOSIS — I471 Supraventricular tachycardia: Secondary | ICD-10-CM

## 2018-12-30 ENCOUNTER — Other Ambulatory Visit (INDEPENDENT_AMBULATORY_CARE_PROVIDER_SITE_OTHER): Payer: BC Managed Care – PPO

## 2018-12-30 DIAGNOSIS — E039 Hypothyroidism, unspecified: Secondary | ICD-10-CM

## 2018-12-30 DIAGNOSIS — Z79899 Other long term (current) drug therapy: Secondary | ICD-10-CM | POA: Diagnosis not present

## 2018-12-30 DIAGNOSIS — I10 Essential (primary) hypertension: Secondary | ICD-10-CM

## 2018-12-30 DIAGNOSIS — R7989 Other specified abnormal findings of blood chemistry: Secondary | ICD-10-CM

## 2018-12-30 DIAGNOSIS — R350 Frequency of micturition: Secondary | ICD-10-CM | POA: Diagnosis not present

## 2018-12-30 DIAGNOSIS — R945 Abnormal results of liver function studies: Secondary | ICD-10-CM

## 2018-12-30 DIAGNOSIS — E785 Hyperlipidemia, unspecified: Secondary | ICD-10-CM | POA: Diagnosis not present

## 2018-12-30 DIAGNOSIS — F439 Reaction to severe stress, unspecified: Secondary | ICD-10-CM

## 2018-12-30 LAB — HEPATIC FUNCTION PANEL
ALT: 36 U/L — ABNORMAL HIGH (ref 0–35)
AST: 23 U/L (ref 0–37)
Albumin: 4.7 g/dL (ref 3.5–5.2)
Alkaline Phosphatase: 109 U/L (ref 39–117)
Bilirubin, Direct: 0.1 mg/dL (ref 0.0–0.3)
Total Bilirubin: 0.4 mg/dL (ref 0.2–1.2)
Total Protein: 7.3 g/dL (ref 6.0–8.3)

## 2018-12-30 LAB — TSH: TSH: 1.51 u[IU]/mL (ref 0.35–4.50)

## 2018-12-30 LAB — BASIC METABOLIC PANEL
BUN: 16 mg/dL (ref 6–23)
CO2: 27 mEq/L (ref 19–32)
Calcium: 9.4 mg/dL (ref 8.4–10.5)
Chloride: 99 mEq/L (ref 96–112)
Creatinine, Ser: 0.68 mg/dL (ref 0.40–1.20)
GFR: 86.85 mL/min (ref >60.00)
Glucose, Bld: 105 mg/dL — ABNORMAL HIGH (ref 70–99)
Potassium: 3.8 mEq/L (ref 3.5–5.1)
Sodium: 134 mEq/L — ABNORMAL LOW (ref 135–145)

## 2018-12-30 LAB — URINALYSIS, ROUTINE W REFLEX MICROSCOPIC
Bilirubin Urine: NEGATIVE
Hgb urine dipstick: NEGATIVE
Ketones, ur: NEGATIVE
Leukocytes,Ua: NEGATIVE
Nitrite: NEGATIVE
RBC / HPF: NONE SEEN (ref 0–?)
Specific Gravity, Urine: <=1.005 — AB (ref 1.000–1.030)
Total Protein, Urine: NEGATIVE
Urine Glucose: NEGATIVE
Urobilinogen, UA: 0.2 (ref 0.0–1.0)
WBC, UA: NONE SEEN (ref 0–?)
pH: 6.5 (ref 5.0–8.0)

## 2018-12-30 LAB — VITAMIN D 25 HYDROXY (VIT D DEFICIENCY, FRACTURES): VITD: 42.89 ng/mL (ref 30.00–100.00)

## 2018-12-30 LAB — MAGNESIUM: Magnesium: 2.2 mg/dL (ref 1.5–2.5)

## 2018-12-31 LAB — URINE CULTURE
MICRO NUMBER:: 793463
Result:: NO GROWTH
SPECIMEN QUALITY:: ADEQUATE

## 2019-01-03 ENCOUNTER — Other Ambulatory Visit: Payer: Self-pay

## 2019-01-03 DIAGNOSIS — R945 Abnormal results of liver function studies: Secondary | ICD-10-CM

## 2019-01-03 DIAGNOSIS — R7989 Other specified abnormal findings of blood chemistry: Secondary | ICD-10-CM

## 2019-01-04 ENCOUNTER — Encounter: Payer: Self-pay | Admitting: Internal Medicine

## 2019-01-18 ENCOUNTER — Telehealth: Payer: Self-pay | Admitting: Internal Medicine

## 2019-01-18 NOTE — Telephone Encounter (Signed)
Patient wrote in that she is having problems with adhesive and rash Hopefully she has had some symptoms from heart while she is wearing     If she cannot take rash then send monitor back, I am not sure there are other adhesive options

## 2019-01-19 NOTE — Telephone Encounter (Signed)
Spoke with the pt and she spoke with [employer] and they are sending her a pediatric kit that is less irritating to the skin and she will be receiving it today and she will rehook up her monitor.. she has 10 days left and will let us know in a few days how she does with the new leads. Up until now, she reports that she has not had many symptoms except when she initially had it put on and even then her symptoms were very mild.

## 2019-01-21 ENCOUNTER — Telehealth: Payer: Self-pay | Admitting: Internal Medicine

## 2019-01-21 NOTE — Telephone Encounter (Signed)
No significant arrhythmia detected   I leave it to patient to decide how much longer she can wear monitor

## 2019-01-24 NOTE — Telephone Encounter (Signed)
Pt had sent MyChart message with concerns about a lot of skin irritation while wearing monitor. Spoke with patient. She has a few more days to wear monitor. She will try using maalox or mylanta on her skin, let it dry and then place the patches so that this may help prevent irritation.  If it doesn't work she will stop monitoring and mail back.

## 2019-01-25 ENCOUNTER — Telehealth: Payer: Self-pay | Admitting: Internal Medicine

## 2019-01-25 NOTE — Telephone Encounter (Signed)
I spoke to the patient with Dr Harrington Challenger' recommendation.  She will disconnect the monitor and send it in.

## 2019-01-25 NOTE — Telephone Encounter (Signed)
Stop using monitor given continued problems with adhesive WIll review findings

## 2019-02-01 ENCOUNTER — Other Ambulatory Visit: Payer: Self-pay

## 2019-02-04 ENCOUNTER — Telehealth: Payer: Self-pay | Admitting: Internal Medicine

## 2019-02-04 DIAGNOSIS — G4739 Other sleep apnea: Secondary | ICD-10-CM

## 2019-02-04 DIAGNOSIS — R5383 Other fatigue: Secondary | ICD-10-CM

## 2019-02-04 NOTE — Telephone Encounter (Signed)
Reviewed results and plan of care with patient.  Notes recorded by Fay Records, MD on 02/01/2019 at 10:40 PM EDT  NO arrhythmias detected  Pt seen by W Panosh  Agree with recomm of b blocker to see if slowing HR, controlling BP will help with her symtpoms  Toprol XL 25  Stay hydrated   Keep appt on 10/1  She would like to think about starting Toprol and discuss further with Dr. Harrington Challenger at appointment on 10/1. She had a recent problem with a recall on her thyroid medication not being formulated correctly. She thanked me for the call.

## 2019-02-04 NOTE — Telephone Encounter (Signed)
Follow Up:     Pt is returning Michelle's call from yesterday, concerning her Monitor results.

## 2019-02-06 ENCOUNTER — Other Ambulatory Visit: Payer: Self-pay | Admitting: Internal Medicine

## 2019-02-07 NOTE — Telephone Encounter (Signed)
Certainly any thyroid med not pure  Such as natural or  desiccated  Thyroid could cause  Hot spots of ingredients causing  Palpitations from uneven absorption .   Less likely to happen with pure levothyroxine .   Fatigue possible with meds  Or sleep apnea   Supposed on a given day could be tired but if tsh in range doubt that fatigue from thyroid itself alone . ( sometime the reason  For the thyroid problem can cause fatigue but no other intervention advised for this)   Your last tsh was just right  Done in August   [name] please update her med list

## 2019-02-07 NOTE — Telephone Encounter (Signed)
  Ultrasound in 2016 showed no kidney areas of concern and no cyst reported  Please get [place] a copy of the mri report  To our med record so we can review . Renal cysts isolated are very common and not often  of medical significance .    Please do referral  To pulmonary  Dx:  Fatigue possible sleep apnea .   For ep[ossible sleep study evaluation

## 2019-02-09 ENCOUNTER — Encounter: Payer: Self-pay | Admitting: Internal Medicine

## 2019-02-10 ENCOUNTER — Encounter: Payer: Self-pay | Admitting: Internal Medicine

## 2019-02-10 ENCOUNTER — Other Ambulatory Visit: Payer: Self-pay

## 2019-02-10 ENCOUNTER — Ambulatory Visit (INDEPENDENT_AMBULATORY_CARE_PROVIDER_SITE_OTHER): Payer: BC Managed Care – PPO | Admitting: Internal Medicine

## 2019-02-10 VITALS — BP 140/82 | HR 105 | Ht 66.0 in | Wt 197.0 lb

## 2019-02-10 DIAGNOSIS — E782 Mixed hyperlipidemia: Secondary | ICD-10-CM

## 2019-02-10 DIAGNOSIS — R002 Palpitations: Secondary | ICD-10-CM

## 2019-02-10 MED ORDER — METOPROLOL SUCCINATE ER 25 MG PO TB24: ORAL_TABLET | ORAL | 1 refills | Status: DC

## 2019-02-10 NOTE — Patient Instructions (Signed)
Medication Instructions:  1.) Toprol XL (metoprolol succinate) 25 mg as directed  If you need a refill on your cardiac medications before your next appointment, please call your pharmacy.   Lab work: In 2 months --LIPIDS If you have labs (blood work) drawn today and your tests are completely normal, you will receive your results only by: Marland Kitchen MyChart Message (if you have MyChart) OR . A paper copy in the mail If you have any lab test that is abnormal or we need to change your treatment, we will call you to review the results.  Testing/Procedures: NONE  Follow-Up: At [facility], you and your health needs are our priority.  As part of our continuing mission to provide you with exceptional heart care, we have created designated Provider Care Teams.  These Care Teams include your primary Cardiologist (physician) and Advanced Practice Providers (APPs -  Physician Assistants and Nurse Practitioners) who all work together to provide you with the care you need, when you need it. You will need a follow up appointment in:  9 months.  Please call our office 2 months in advance to schedule this appointment.  You may see Dorris Carnes, MD or one of the following Advanced Practice Providers on your designated Care Team: Richardson Dopp, PA-C [place], [place] . Daune Perch, NP  Any Other Special Instructions Will Be Listed Below (If Applicable).

## 2019-02-10 NOTE — Progress Notes (Signed)
Cardiology Office Note   Date:  02/10/2019   ID:  Darneshia Levitz, DOB [birth year], MRN [id number]  PCP:  Burnis Medin, MD  Cardiologist:   Dorris Carnes, MD   Pt presents for f/u of HTN and palpitations      History of Present Illness: [name] is a 63 y.o. female with a history of HTN, HL obesity, palpitations, dizziness (near syncope (seen in EDx 2)) and CP    She is followed by Idell Pickles.   Seen earlier this summer    THe pt was set up for an event monitor that she wore earlier this month  NO arrhythmia demonstrated.   I  recomm that she start a trial of low dose Toprol XL 25 to slow HR, decrease irritability  Since seen the pt says she is feelng a little better   Stopped the keto diet  Now on mediterannian diet.  Denies CP   NO dizzy spells       Current Meds  Medication Sig  . Ascorbic Acid (VITAMIN C) 1000 MG tablet Take 1,000 mg by mouth daily.  . fluticasone (FLONASE) 50 MCG/ACT nasal spray Place into the nose.  . hydrochlorothiazide (HYDRODIURIL) 25 MG tablet TAKE 1 TABLET BY MOUTH EVERY DAY (Patient taking differently: 12.5 mg. )  . LORazepam (ATIVAN) 1 MG tablet Take 0.5-1 tablets (0.5-1 mg total) by mouth every 8 (eight) hours as needed for anxiety. anxiety  . MAGNESIUM CARBONATE PO Take 1 tablet by mouth daily.  . nitrofurantoin, macrocrystal-monohydrate, (MACROBID) 100 MG capsule Take 1 capsule (100 mg total) by mouth 2 (two) times daily.  . NON FORMULARY Thyroid desiccated (procine) sr 2 capsules on Sunday Tuesday Thursday Saturday and 1 capsule Monday Wednesday friday  . OVER THE COUNTER MEDICATION Essential Pro  . OVER THE COUNTER MEDICATION Take 2 capsules by mouth daily. iflamed  . potassium chloride SA (K-DUR) 20 MEQ tablet TAKE ONE TABLET BY MOUTH ONE TIME DAILY      Allergies:   Penicillins, Cefdinir, Norvasc [amlodipine besylate], Cetirizine, Irbesartan, and Tylenol [acetaminophen]   Past Medical History:  Diagnosis Date  . Anal fissure    . Atrial septal aneurysm / if pfo  echo 6 13  10/23/2011  . Cataract    both eyes  . Depression   . Fatty liver    "pre fatty liver"  . Fibromyalgia   . Headache(784.0)    hx of migraines when younger  . Heart palpitations    hx with normal holter event monitoring  . Hx: UTI (urinary tract infection)   . Hyperlipidemia   . Hypertension   . Hypothyroidism   . Obesity   . Pneumonia 1972   hx of  . Polyp of colon   . 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 Skin cancer    basal, squamous cell    Past Surgical History:  Procedure Laterality Date  . COLONOSCOPY    . DILATION AND CURETTAGE OF UTERUS  1978  . [place]  . KNEE ARTHROSCOPY     both in past  . POLYPECTOMY    . SHOULDER OPEN ROTATOR CUFF REPAIR Left 07/07/2013   Procedure: LEFT SHOULDER MINI OPEN SUBACROMIAL DECOMPRESSION ROTATOR CUFF REPAIR AND POSSIBLE PATCH GRAFT ;  Surgeon: Johnn Hai, MD;  Location: [facility];  Service: Orthopedics;  Laterality: Left;  with interscaline block  . SKIN CANCER EXCISION Bilateral    arm, legs, and chest  . TONSILLECTOMY  Social History:  The patient  reports that she has never smoked. She has never used smokeless tobacco. She reports current alcohol use. She reports that she does not use drugs.   Family History:  The patient's family history includes ADD / ADHD in her child; Cancer in her sister; Hyperlipidemia in her father and another family member; Hypertension in her father and mother; Juvenile Diabetes in her daughter; Liver disease in her father; Melanoma in her sister; Osteoporosis in her mother.    ROS:  Please see the history of present illness. All other systems are reviewed and  Negative to the above problem except as noted.    PHYSICAL EXAM: VS:  BP 140/82   Pulse (!) 105   Ht 5\' 6"  (1.676 m)   Wt 197 lb (89.4 kg)   SpO2 98%   BMI 31.80 kg/m   GEN: Obese 63 yo  in no acute distress  HEENT: normal  Neck: no JVD, carotid bruits   Cardiac: RRR; no murmurs, rubs, or gallops,no edema  Respiratory:  clear to auscultation bilaterally, normal work of breathing GI: soft, nontender, nondistended, + BS  No hepatomegaly  MS: no deformity Moving all extremities   Skin: warm and dry, no rash Psych: euthymic mood, full affect   EKG:  EKG is not  ordered today. Lipid Panel    Component Value Date/Time   CHOL 262 (A) 01/14/2018   TRIG 63 01/14/2018   HDL 73 (A) 01/14/2018   CHOLHDL 4 07/23/2016 0853   VLDL 12.0 07/23/2016 0853   LDLCALC 173 01/14/2018   LDLDIRECT 165.2 12/27/2012 1355      Wt Readings from Last 3 Encounters:  02/10/19 197 lb (89.4 kg)  12/17/18 195 lb 6.4 oz (88.6 kg)  07/13/18 199 lb (90.3 kg)      ASSESSMENT AND PLAN:  1  Palpitations  Currrently the pt denies symtpoms   I still would recomm toprol XL to take if she hs some   She is willing to use on a prn basis  1  Chest pain  Currently denies   2  HTN  BP is fair   Needs to be followed closely   It is up and down  4  PFO  Small PFO on echo in 2019   Normal LV and RV   5  HL  Last lipids in 2019  LDL 173  PT says she is changing diet    WIll have er set up for labs in a couple months     F/U in 9 months    Current medicines are reviewed at length with the patient today.  The patient does not have concerns regarding medicines.  Signed, Dorris Carnes, MD  02/10/2019 4:41 PM    [facility] [address], [place], [place]  [address] Phone: [phone]; Fax: [phone]

## 2019-04-12 ENCOUNTER — Other Ambulatory Visit: Payer: Self-pay

## 2019-04-12 DIAGNOSIS — E782 Mixed hyperlipidemia: Secondary | ICD-10-CM

## 2019-04-12 DIAGNOSIS — R002 Palpitations: Secondary | ICD-10-CM

## 2019-04-12 LAB — LIPID PANEL
Chol/HDL Ratio: 2.9 ratio (ref 0.0–4.4)
Cholesterol, Total: 238 mg/dL — ABNORMAL HIGH (ref 100–199)
HDL: 82 mg/dL (ref >39)
LDL Chol Calc (NIH): 146 mg/dL — ABNORMAL HIGH (ref 0–99)
Triglycerides: 61 mg/dL (ref 0–149)
VLDL Cholesterol Cal: 10 mg/dL (ref 5–40)

## 2019-04-13 ENCOUNTER — Telehealth: Payer: Self-pay

## 2019-04-13 NOTE — Telephone Encounter (Signed)
-----   Message from Fay Records, MD sent at 04/12/2019  8:43 PM EST ----- LDL is better than past but still should be better  LDL is 146    Head CT showed intimal thickening of the carotids Goal for LDL should be closer to 100

## 2019-04-13 NOTE — Telephone Encounter (Signed)
lmtcb

## 2019-04-28 ENCOUNTER — Other Ambulatory Visit: Payer: Self-pay

## 2019-04-28 ENCOUNTER — Encounter: Payer: Medicare HMO | Admitting: Diagnostic Neuroimaging

## 2019-04-28 ENCOUNTER — Encounter

## 2019-04-28 ENCOUNTER — Ambulatory Visit (INDEPENDENT_AMBULATORY_CARE_PROVIDER_SITE_OTHER): Payer: Medicare HMO | Admitting: Diagnostic Neuroimaging

## 2019-04-28 DIAGNOSIS — R200 Anesthesia of skin: Secondary | ICD-10-CM | POA: Diagnosis not present

## 2019-04-28 DIAGNOSIS — Z0289 Encounter for other administrative examinations: Secondary | ICD-10-CM

## 2019-04-28 NOTE — Procedures (Signed)
[facility]  NCS (NERVE CONDUCTION STUDY) WITH EMG (ELECTROMYOGRAPHY) REPORT   STUDY DATE: 04/28/19 PATIENT NAME: [name] DOB: 1956/04/07 MRN: [id number]  ORDERING CLINICIAN: Trenton Gammon  TECHNOLOGIST: Sherre Scarlet ELECTROMYOGRAPHER: Earlean Polka. Shalev Helminiak, MD  CLINICAL INFORMATION: 63 year old female with intermittent lower extremity numbness, from thighs down to her feet, for past 1 year.  Symptoms worse with new bed and high stress.  FINDINGS: NERVE CONDUCTION STUDY:  Bilateral peroneal and tibial motor responses are normal.  Bilateral sural sensory responses are normal.  Bilateral superficial peroneal sensory response cannot be obtained.   NEEDLE ELECTROMYOGRAPHY:  Needle examination of right lower extremity vastus medialis, tibialis anterior, gastrocnemius and right lumbar paraspinal muscles is normal.   IMPRESSION:   This study demonstrates: - No electrodiagnostic evidence of widespread large fiber polyneuropathy. - Bilateral superficial peroneal sensory responses could not be obtained, however this may be due to technical limitations and body habitus.    INTERPRETING PHYSICIAN:  Penni Bombard, MD Certified in Neurology, Neurophysiology and Neuroimaging  [facility] [address] [place], [place] [address] [phone]  [facility]    Nerve / Sites Muscle Latency Ref. Amplitude Ref. Rel Amp Segments Distance Velocity Ref. Area    ms ms mV mV %  cm m/s m/s mVms  R Peroneal - EDB     Ankle EDB 4.1 ?6.5 6.3 ?2.0 100 Ankle - EDB 9   18.7     Fib head EDB 9.6  5.3  84.1 Fib head - Ankle 28 50 ?44 16.7     Pop fossa EDB 11.6  5.5  103 Pop fossa - Fib head 10 50 ?44 17.3         Pop fossa - Ankle      L Peroneal - EDB     Ankle EDB 4.6 ?6.5 4.6 ?2.0 100 Ankle - EDB 9   15.5     Fib head EDB 10.5  3.6  78.8 Fib head - Ankle 29 49 ?44 14.8     Pop fossa EDB 12.6  3.0  83.6 Pop fossa - Fib head 10 48 ?44 12.9    Pop fossa - Ankle      R Tibial - AH     Ankle AH 3.5 ?5.8 10.3 ?4.0 100 Ankle - AH 9   19.1     Pop fossa AH 12.4  6.9  67 Pop fossa - Ankle 37 42 ?41 16.4  L Tibial - AH     Ankle AH 3.6 ?5.8 6.3 ?4.0 100 Ankle - AH 9   17.2     Pop fossa AH 12.4  3.7  58.4 Pop fossa - Ankle 37 42 ?41 10.8             SNC    Nerve / Sites Rec. Site Peak Lat Ref.  Amp Ref. Segments Distance    ms ms V V  cm  R Sural - Ankle (Calf)     Calf Ankle 3.3 ?4.4 17 ?6 Calf - Ankle 14  L Sural - Ankle (Calf)     Calf Ankle 2.9 ?4.4 16 ?6 Calf - Ankle 14  R Superficial peroneal - Ankle     Lat leg Ankle NR ?4.4 NR ?6 Lat leg - Ankle 14  L Superficial peroneal - Ankle     Lat leg Ankle NR ?4.4 NR ?6 Lat leg - Ankle 14             F  Wave    Nerve F Lat Ref.   ms ms  R Tibial - AH 54.7 ?56.0  L Tibial - AH 55.0 ?56.0         EMG Summary Table    Spontaneous MUAP Recruitment  Muscle IA Fib PSW Fasc Other Amp Dur. Poly Pattern  R. Vastus medialis Normal None None None _______ Normal Normal Normal Normal  R. Tibialis anterior Normal None None None _______ Normal Normal Normal Normal  R. Gastrocnemius (Medial head) Normal None None None _______ Normal Normal Normal Normal  R. Lumbar paraspinals Normal None None None _______ Normal Normal Normal Normal

## 2019-04-29 ENCOUNTER — Other Ambulatory Visit: Payer: Self-pay | Admitting: Internal Medicine

## 2019-05-01 ENCOUNTER — Other Ambulatory Visit: Payer: Self-pay | Admitting: Internal Medicine

## 2019-05-12 ENCOUNTER — Encounter: Payer: Self-pay | Admitting: Internal Medicine

## 2019-05-12 ENCOUNTER — Telehealth (INDEPENDENT_AMBULATORY_CARE_PROVIDER_SITE_OTHER): Payer: Medicare HMO | Admitting: Internal Medicine

## 2019-05-12 ENCOUNTER — Other Ambulatory Visit: Payer: Self-pay

## 2019-05-12 DIAGNOSIS — Z79899 Other long term (current) drug therapy: Secondary | ICD-10-CM | POA: Diagnosis not present

## 2019-05-12 DIAGNOSIS — G4739 Other sleep apnea: Secondary | ICD-10-CM

## 2019-05-12 DIAGNOSIS — T887XXA Unspecified adverse effect of drug or medicament, initial encounter: Secondary | ICD-10-CM | POA: Diagnosis not present

## 2019-05-12 DIAGNOSIS — R0989 Other specified symptoms and signs involving the circulatory and respiratory systems: Secondary | ICD-10-CM

## 2019-05-12 NOTE — Progress Notes (Signed)
Virtual Visit via Video Note  I connected with@ on 05/12/19 at 11:00 AM EST by a video enabled telemedicine application and verified that I am speaking with the correct person using two identifiers. Location patient: home Location provider:home office Persons participating in the virtual visit: patient, provider  [place] national recommendations  regarding COVID 19 pandemic   video visit is advised over in office visit for this patient.  Patient aware  of the limitations of evaluation and management by telemedicine and  availability of in person appointments. and agreed to proceed.   HPI: [name] presents for video visit SDA because of elevated blood pressure reading this morning over baseline. She has a history of hypertension somewhat labile at times and has been taking HCTZ whole pill that caused some dizziness so decreased to half a pill a day.  Most recently her blood pressures were 147/77 but this morning 160/90. She has metoprolol XL 25 mg  Per Dr Harrington Challenger but is only taking it as needed for palpitations l.  none recent .   She is to have a home sleep test because of  personal history of snoring family history sleep apnea and some sleep issues and a.m. elevated blood pressures.    She is working hard on lifestyle weight 197 exercise thyroid to be checked soon.     ROS: See pertinent positives and negatives per HPI. No syncope   Past Medical History:  Diagnosis Date  . Anal fissure   . Atrial septal aneurysm / if pfo  echo 6 13  10/23/2011  . Cataract    both eyes  . Depression   . Fatty liver    "pre fatty liver"  . Fibromyalgia   . Headache(784.0)    hx of migraines when younger  . Heart palpitations    hx with normal holter event monitoring  . Hx: UTI (urinary tract infection)   . Hyperlipidemia   . Hypertension   . Hypothyroidism   . Obesity   . Pneumonia 1972   hx of  . Polyp of colon   . 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 Skin cancer    basal, squamous  cell    Past Surgical History:  Procedure Laterality Date  . COLONOSCOPY    . DILATION AND CURETTAGE OF UTERUS  1978  . [place]  . KNEE ARTHROSCOPY     both in past  . POLYPECTOMY    . SHOULDER OPEN ROTATOR CUFF REPAIR Left 07/07/2013   Procedure: LEFT SHOULDER MINI OPEN SUBACROMIAL DECOMPRESSION ROTATOR CUFF REPAIR AND POSSIBLE PATCH GRAFT ;  Surgeon: Johnn Hai, MD;  Location: [facility];  Service: Orthopedics;  Laterality: Left;  with interscaline block  . SKIN CANCER EXCISION Bilateral    arm, legs, and chest  . TONSILLECTOMY      Family History  Problem Relation Age of Onset  . Hypertension Mother        low borderline  . Osteoporosis Mother   . Hypertension Father   . Liver disease Father        amyloid deceased  . Hyperlipidemia Father   . Melanoma Sister   . Cancer Sister   . Juvenile Diabetes Daughter   . ADD / ADHD Child   . Hyperlipidemia Other        Maternal grandmother  . Colon cancer Neg Hx   . Stomach cancer Neg Hx   . Colon polyps Neg Hx   . Esophageal cancer Neg Hx   .  Rectal cancer Neg Hx         Current Outpatient Medications:  .  Ascorbic Acid (VITAMIN C) 1000 MG tablet, Take 1,000 mg by mouth daily., Disp: , Rfl:  .  fluticasone (FLONASE) 50 MCG/ACT nasal spray, Place into the nose., Disp: , Rfl:  .  hydrochlorothiazide (HYDRODIURIL) 25 MG tablet, TAKE 1 TABLET BY MOUTH EVERY DAY, Disp: 90 tablet, Rfl: 1 .  LORazepam (ATIVAN) 1 MG tablet, Take 0.5-1 tablets (0.5-1 mg total) by mouth every 8 (eight) hours as needed for anxiety. anxiety, Disp: 20 tablet, Rfl: 0 .  MAGNESIUM CARBONATE PO, Take 1 tablet by mouth daily., Disp: , Rfl:  .  metoprolol succinate (TOPROL XL) 25 MG 24 hr tablet, TAKE ONCE A DAY AS DIRECTED, Disp: 30 tablet, Rfl: 1 .  NON FORMULARY, Thyroid desiccated (procine) sr 2 capsules on Sunday Tuesday Thursday Saturday and 1 capsule Monday Wednesday friday, Disp: , Rfl:  .  OVER THE COUNTER MEDICATION,  Essential Pro, Disp: , Rfl:  .  OVER THE COUNTER MEDICATION, Take 2 capsules by mouth daily. iflamed, Disp: , Rfl:  .  potassium chloride SA (KLOR-CON) 20 MEQ tablet, TAKE ONE TABLET BY MOUTH ONE TIME DAILY , Disp: 90 tablet, Rfl: 0 .  Progesterone Micronized (PROGESTERONE PO), progesterone sr 150mg  cap  TAKE ONE CAPSULE BY MOUTH AT BEDTIME, Disp: , Rfl:   EXAM: BP Readings from Last 3 Encounters:  02/10/19 140/82  12/17/18 139/74  07/13/18 (!) 150/88    VITALS per patient if applicable: [id number] this morning can come down to 147/77 pulse in the 70 range.  GENERAL: alert, oriented, appears well and in no acute distress  HEENT: atraumatic, conjunttiva clear, no obvious abnormalities on inspection of external nose and ears  NECK: normal movements of the head and neck  LUNGS: on inspection no signs of respiratory distress, breathing rate appears normal, no obvious gross SOB, gasping or wheezing  CV: no obvious cyanosis  PSYCH/NEURO: pleasant and cooperative, no obvious depression or anxiety, speech and thought processing grossly intact   ASSESSMENT AND PLAN:  Discussed the following assessment and plan:    ICD-10-CM   1. Labile hypertension  R09.89   2. Medication management  Z79.899   3. Sleep apnea-like behavior  G47.39   4. Medication side effect  T88.7XXA    probable  diuretic irrespective of bp lowering   I suspect she is getting a dizziness side effect of the diuretic irrespective of its antihypertensive effect.  So at this point stay on half of the HCTZ and add half of the Toprol once a day send in readings in about 2 weeks this may not be intense enough treatment but will start without adding new medications at this time. Agree with evaluation for potential sleep apnea. Continue her lifestyle control lower sodium exercise weight control.   Counseled.   Expectant management and discussion of plan and treatment with opportunity to ask questions and all were answered. The  patient agreed with the plan and demonstrated an understanding of the instructions.   Advised to call back or seek an in-person evaluation if worsening  or having  further concerns . Return for bp readings in about 2 weeks .  Shanon Ace, MD

## 2019-06-10 ENCOUNTER — Other Ambulatory Visit: Payer: Medicare HMO

## 2019-08-05 LAB — HEPATIC FUNCTION PANEL
ALT: 22 (ref 7–35)
AST: 22 (ref 13–35)
Alkaline Phosphatase: 107 (ref 25–125)
Bilirubin, Total: 0.5

## 2019-08-05 LAB — BASIC METABOLIC PANEL
BUN: 18 (ref 4–21)
CO2: 25 — AB (ref 13–22)
Chloride: 104 (ref 99–108)
Creatinine: 0.7 (ref <=1.1)
Glucose: 87
Potassium: 4.3 (ref 3.4–5.3)
Sodium: 142 (ref 137–147)

## 2019-08-05 LAB — LIPID PANEL
Cholesterol: 245 — AB (ref 0–200)
HDL: 81 — AB (ref 35–70)
LDL Cholesterol: 155
Triglycerides: 56 (ref 40–160)

## 2019-08-05 LAB — COMPREHENSIVE METABOLIC PANEL
Albumin: 4.4 (ref 3.5–5.0)
Calcium: 9.6 (ref 8.7–10.7)
Globulin: 2.5

## 2019-08-15 ENCOUNTER — Telehealth: Payer: Medicare HMO | Admitting: Internal Medicine

## 2019-08-18 ENCOUNTER — Other Ambulatory Visit: Payer: Self-pay | Admitting: Internal Medicine

## 2019-08-19 ENCOUNTER — Other Ambulatory Visit: Payer: Self-pay

## 2019-08-19 ENCOUNTER — Telehealth: Payer: Self-pay | Admitting: Internal Medicine

## 2019-08-19 MED ORDER — POTASSIUM CHLORIDE CRYS ER 20 MEQ PO TBCR: 20.0000 meq | EXTENDED_RELEASE_TABLET | Freq: Every day | ORAL | 0 refills | Status: DC

## 2019-08-19 NOTE — Telephone Encounter (Signed)
Pt call and stated that she need a refill on potassium chloride SA (KLOR-CON) 20 MEQ tablet sent to  [facility] # [place], [place] Phone:  [phone]  Fax:  [phone]

## 2019-08-19 NOTE — Telephone Encounter (Signed)
Called patient and let her know that this was sent to the pharmacy yesterday and I have sent again today. Patient will check with pharmacy also.

## 2019-09-05 ENCOUNTER — Encounter: Payer: Self-pay | Admitting: Internal Medicine

## 2019-09-05 ENCOUNTER — Other Ambulatory Visit: Payer: Self-pay

## 2019-09-05 ENCOUNTER — Telehealth (INDEPENDENT_AMBULATORY_CARE_PROVIDER_SITE_OTHER): Payer: Medicare HMO | Admitting: Internal Medicine

## 2019-09-05 VITALS — Ht 66.0 in | Wt 205.0 lb

## 2019-09-05 DIAGNOSIS — R0981 Nasal congestion: Secondary | ICD-10-CM

## 2019-09-05 DIAGNOSIS — R0989 Other specified symptoms and signs involving the circulatory and respiratory systems: Secondary | ICD-10-CM | POA: Diagnosis not present

## 2019-09-05 DIAGNOSIS — R058 Other specified cough: Secondary | ICD-10-CM

## 2019-09-05 DIAGNOSIS — Z79899 Other long term (current) drug therapy: Secondary | ICD-10-CM

## 2019-09-05 DIAGNOSIS — G4733 Obstructive sleep apnea (adult) (pediatric): Secondary | ICD-10-CM

## 2019-09-05 DIAGNOSIS — Z7189 Other specified counseling: Secondary | ICD-10-CM

## 2019-09-05 DIAGNOSIS — Z7185 Encounter for immunization safety counseling: Secondary | ICD-10-CM

## 2019-09-05 DIAGNOSIS — R05 Cough: Secondary | ICD-10-CM

## 2019-09-05 NOTE — Progress Notes (Signed)
Virtual Visit via Video Note  I connected with@ on 09/09/19 at  4:00 PM EDT by a video enabled telemedicine application and verified that I am speaking with the correct person using two identifiers. Location patient: home Location provider:work office Persons participating in the virtual visit: patient, provider  [place] national recommendations  regarding COVID 19 pandemic   video visit is advised over in office visit for this patient.  Patient aware  of the limitations of evaluation and management by telemedicine and  availability of in person appointments. and agreed to proceed.   HPI: [name] presents for video visit About 10 days  Of sx sinus pressure and above   Had rrot canal in jan and always concerned  Painting pollen  And  OSA machine  For about 5-6 weeks    Exposures   Dr Helene Kelp. [facility]     Onset with head  Congestion.    hoarse  At times   problem and now getting better coughing makes head hurt . Slight pressure  Upper chest.  Had one episode bloody mucous.  Stuffy all the time  Mask  All the time  ? If allergies ears clogged   Just.  started guaifenesin.  Post nasal drainage  No fever or aches and pains.  Tired  Kathlee Nations had bronchitis    Dewain Penning    Fever and uri  Neg covid.    ROS: See pertinent positives and negatives per HPI.  Past Medical History:  Diagnosis Date  . Anal fissure   . Atrial septal aneurysm / if pfo  echo 6 13  10/23/2011  . Cataract    both eyes  . Depression   . Fatty liver    "pre fatty liver"  . Fibromyalgia   . Headache(784.0)    hx of migraines when younger  . Heart palpitations    hx with normal holter event monitoring  . Hx: UTI (urinary tract infection)   . Hyperlipidemia   . Hypertension   . Hypothyroidism   . Obesity   . Pneumonia 1972   hx of  . Polyp of colon   . 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 Skin cancer    basal, squamous cell    Past Surgical History:  Procedure Laterality Date  . COLONOSCOPY    .  DILATION AND CURETTAGE OF UTERUS  1978  . [place]  . KNEE ARTHROSCOPY     both in past  . POLYPECTOMY    . SHOULDER OPEN ROTATOR CUFF REPAIR Left 07/07/2013   Procedure: LEFT SHOULDER MINI OPEN SUBACROMIAL DECOMPRESSION ROTATOR CUFF REPAIR AND POSSIBLE PATCH GRAFT ;  Surgeon: Johnn Hai, MD;  Location: [facility];  Service: Orthopedics;  Laterality: Left;  with interscaline block  . SKIN CANCER EXCISION Bilateral    arm, legs, and chest  . TONSILLECTOMY      Family History  Problem Relation Age of Onset  . Hypertension Mother        low borderline  . Osteoporosis Mother   . Hypertension Father   . Liver disease Father        amyloid deceased  . Hyperlipidemia Father   . Melanoma Sister   . Cancer Sister   . Juvenile Diabetes Daughter   . ADD / ADHD Child   . Hyperlipidemia Other        Maternal grandmother  . Colon cancer Neg Hx   . Stomach cancer Neg Hx   . Colon polyps  Neg Hx   . Esophageal cancer Neg Hx   . Rectal cancer Neg Hx     Social History   Tobacco Use  . Smoking status: Never Smoker  . Smokeless tobacco: Never Used  Substance Use Topics  . Alcohol use: Yes    Comment: rare  . Drug use: No      Current Outpatient Medications:  .  Ascorbic Acid (VITAMIN C) 1000 MG tablet, Take 1,000 mg by mouth daily., Disp: , Rfl:  .  hydrochlorothiazide (HYDRODIURIL) 25 MG tablet, TAKE 1 TABLET BY MOUTH EVERY DAY (Patient taking differently: Take 12.5 mg by mouth daily. ), Disp: 90 tablet, Rfl: 1 .  LORazepam (ATIVAN) 1 MG tablet, Take 0.5-1 tablets (0.5-1 mg total) by mouth every 8 (eight) hours as needed for anxiety. anxiety, Disp: 20 tablet, Rfl: 0 .  MAGNESIUM CARBONATE PO, Take 1 tablet by mouth daily., Disp: , Rfl:  .  NON FORMULARY, Thyroid desiccated (procine) sr 2 capsules on Sunday Tuesday Thursday Saturday and 1 capsule Monday Wednesday friday, Disp: , Rfl:  .  OVER THE COUNTER MEDICATION, Essential Pro, Disp: , Rfl:  .  OVER THE  COUNTER MEDICATION, Take 2 capsules by mouth daily. iflamed, Disp: , Rfl:  .  potassium chloride SA (KLOR-CON) 20 MEQ tablet, Take 1 tablet (20 mEq total) by mouth daily., Disp: 90 tablet, Rfl: 0 .  Progesterone Micronized (PROGESTERONE PO), progesterone sr 150mg  cap  TAKE ONE CAPSULE BY MOUTH AT BEDTIME, Disp: , Rfl:  .  Pseudoephedrine-guaiFENesin (GUAIFENESIN 600/PSE 120 PO), Take 1 tablet by mouth as needed., Disp: , Rfl:  .  metoprolol succinate (TOPROL XL) 25 MG 24 hr tablet, TAKE ONCE A DAY AS DIRECTED (Patient not taking: Reported on 09/05/2019), Disp: 30 tablet, Rfl: 1  EXAM: BP Readings from Last 3 Encounters:  02/10/19 140/82  12/17/18 139/74  07/13/18 (!) 150/88    VITALS per patient if applicable:  GENERAL: alert, oriented, appears well and in no acute distress  HEENT: atraumatic, conjunttiva clear, no obvious abnormalities on inspection of external nose and ears  NECK: normal movements of the head and neck  LUNGS: on inspection no signs of respiratory distress, breathing rate appears normal, no obvious gross SOB, gasping or wheezing  CV: no obvious cyanosis  MS: moves all visible extremities without noticeable abnormality  PSYCH/NEURO: pleasant and cooperative, no obvious depression or anxiety, speech and thought processing grossly intact Lab Results  Component Value Date   WBC 9.1 06/12/2018   HGB 13.1 06/12/2018   HCT 41.1 06/12/2018   PLT 211 06/12/2018   GLUCOSE 105 (H) 12/30/2018   CHOL 245 (A) 08/05/2019   TRIG 56 08/05/2019   HDL 81 (A) 08/05/2019   LDLDIRECT 165.2 12/27/2012   LDLCALC 155 08/05/2019   ALT 22 08/05/2019   AST 22 08/05/2019   NA 142 08/05/2019   K 4.3 08/05/2019   CL 104 08/05/2019   CREATININE 0.7 08/05/2019   BUN 18 08/05/2019   CO2 25 (A) 08/05/2019   TSH 1.51 12/30/2018   HGBA1C 5.2 06/01/2013    ASSESSMENT AND PLAN:  Discussed the following assessment and plan:    ICD-10-CM   1. Cough with congestion of paranasal sinus   R05    R09.81   2. Medication management  Z79.899   3. Labile hypertension  R09.89   4. OSA (obstructive sleep apnea)  G47.33   5. Vaccine counseling  Z71.89     Counseled.   Advise covid vaccine  Dis  mrna vs other  beneffit more than risk for her she will consider  Consider r testing but this sounds like   Add antihistamine such as plain Claritin  or allegra  And time and let us know how doins  Pulse ox is 96 range acceptable    Expectant management and discussion of plan and treatment with opportunity to ask questions and all were answered. The patient agreed with the plan and demonstrated an understanding of the instructions.   Advised to call back or seek an in-person evaluation if worsening  or having  further concerns . Return if symptoms worsen or fail to improve as expected.  Outside external source  DATA REVIEWED: lab from  [facility]  Tc 245 ldl 155 crp 3.47 tsh 3.3 neg aby nl lfst chem   Total time on date  of service including record review ordering and plan of care:   32 minutes  reviewe counsel vaccine etc   Shanon Ace, MD

## 2019-10-23 ENCOUNTER — Other Ambulatory Visit: Payer: Self-pay | Admitting: Internal Medicine

## 2019-10-25 ENCOUNTER — Other Ambulatory Visit: Payer: Self-pay

## 2019-10-25 NOTE — Progress Notes (Signed)
Chief Complaint  Patient presents with  . Fatigue    exhausted and not feeling well for months    HPI: Raniah Karan 64 y.o. come in for feeling more tired than usual over the past moinths  In counseling in regard to  Marriage realtionship also   Tired and stress.      sinuses and foot .     Years of fatigue   and  Worse in past 3 months   naps in afternoon  .   Takes electrolytes.  Helps leg cramps  Metoprolol as needed .  Low dose hctz  And seems to help bp but has some readings way up on a given day   Thyroid :   Apparently in  Range  Sees alternative med doc  Dr A and had lab  Nl lft and  Other rechetly Sleep apnea   mild on machine . Less apnea less.  Nocturia less down to 1 per night .   10 # weight gain  Corn chips   Seems to be a trigger  Looking into gym   Y and swim.    consdiering weight watchers  On machine osa  Sleeping better   Sewer odor at times in older house  Trying drops to combat mold? Allergy   No co minitor in home  ROS: See pertinent positives and negatives per HPI.  Past Medical History:  Diagnosis Date  . Anal fissure   . Atrial septal aneurysm / if pfo  echo 6 13  10/23/2011  . Cataract    both eyes  . Depression   . Fatty liver    "pre fatty liver"  . Fibromyalgia   . Headache(784.0)    hx of migraines when younger  . Heart palpitations    hx with normal holter event monitoring  . Hx: UTI (urinary tract infection)   . Hyperlipidemia   . Hypertension   . Hypothyroidism   . Obesity   . Pneumonia 1972   hx of  . Polyp of colon   . 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 Skin cancer    basal, squamous cell    Family History  Problem Relation Age of Onset  . Hypertension Mother        low borderline  . Osteoporosis Mother   . Hypertension Father   . Liver disease Father        amyloid deceased  . Hyperlipidemia Father   . Melanoma Sister   . Cancer Sister   . Juvenile Diabetes Daughter   . ADD / ADHD Child   . Hyperlipidemia  Other        Maternal grandmother  . Colon cancer Neg Hx   . Stomach cancer Neg Hx   . Colon polyps Neg Hx   . Esophageal cancer Neg Hx   . Rectal cancer Neg Hx     Social History   Socioeconomic History  . Marital status: Married    Spouse name: Not on file  . Number of children: 6  . Years of education: Not on file  . Highest education level: Not on file  Occupational History  . Occupation: TEACHER ASST    Employer: [employer]  Tobacco Use  . Smoking status: Never Smoker  . Smokeless tobacco: Never Used  Vaping Use  . Vaping Use: Never used  Substance and Sexual Activity  . Alcohol use: Yes    Comment: rare  . Drug use: No  . Sexual  activity: Not on file  Other Topics Concern  . Not on file  Social History Narrative   Teachers [employer]  Not workking since last February . 15 left shoulder surgery    Married   Special educ   HH of 4   3 outside dogs, 8 goats, 40 chickens and quail. [place]   Job stresses   Ex husband passes away   Not working after injur at school  Shoulder neck                Social Determinants of Health   Financial Resource Strain:   . Difficulty of Paying Living Expenses:   Food Insecurity:   . Worried About Charity fundraiser in the Last Year:   . Arboriculturist in the Last Year:   Transportation Needs:   . Film/video editor (Medical):   Marland Kitchen Lack of Transportation (Non-Medical):   Physical Activity:   . Days of Exercise per Week:   . Minutes of Exercise per Session:   Stress:   . Feeling of Stress :   Social Connections:   . Frequency of Communication with Friends and Family:   . Frequency of Social Gatherings with Friends and Family:   . Attends Religious Services:   . Active Member of Clubs or Organizations:   . Attends Archivist Meetings:   Marland Kitchen Marital Status:     Outpatient Medications Prior to Visit  Medication Sig Dispense Refill  . Ascorbic Acid (VITAMIN C) 1000  MG tablet Take 1,000 mg by mouth daily.    . hydrochlorothiazide (HYDRODIURIL) 25 MG tablet TAKE 1 TABLET BY MOUTH EVERY DAY 90 tablet 0  . LORazepam (ATIVAN) 1 MG tablet Take 0.5-1 tablets (0.5-1 mg total) by mouth every 8 (eight) hours as needed for anxiety. anxiety 20 tablet 0  . MAGNESIUM CARBONATE PO Take 1 tablet by mouth daily.    . NON FORMULARY Thyroid desiccated (procine) sr 2 capsules on Sunday Tuesday Thursday Saturday and 1 capsule Monday Wednesday friday    . OVER THE COUNTER MEDICATION Essential Pro    . OVER THE COUNTER MEDICATION Take 2 capsules by mouth daily. iflamed    . potassium chloride SA (KLOR-CON) 20 MEQ tablet Take 1 tablet (20 mEq total) by mouth daily. 90 tablet 0  . Progesterone Micronized (PROGESTERONE PO) progesterone sr 150mg  cap  TAKE ONE CAPSULE BY MOUTH AT BEDTIME    . Pseudoephedrine-guaiFENesin (GUAIFENESIN 600/PSE 120 PO) Take 1 tablet by mouth as needed.    . metoprolol succinate (TOPROL XL) 25 MG 24 hr tablet TAKE ONCE A DAY AS DIRECTED (Patient not taking: Reported on 09/05/2019) 30 tablet 1   No facility-administered medications prior to visit.     EXAM:  BP (!) 148/78   Pulse 93   Temp 98 F (36.7 C) (Temporal)   Ht 5\' 6"  (1.676 m)   Wt 208 lb 12.8 oz (94.7 kg)   SpO2 98%   BMI 33.70 kg/m   Body mass index is 33.7 kg/m. Wt Readings from Last 3 Encounters:  10/26/19 208 lb 12.8 oz (94.7 kg)  09/05/19 205 lb (93 kg)  02/10/19 197 lb (89.4 kg)    GENERAL: vitals reviewed and listed above, alert, oriented, appears well hydrated and in no acute distress HEENT: atraumatic, conjunctiva  clear, no obvious abnormalities on inspection of external nose and ears  NECK: no obvious masses on inspection palpation  LUNGS: clear to  auscultation bilaterally, no wheezes, rales or rhonchi, good air movement CV: HRRR, no clubbing cyanosis or  peripheral edema nl cap refill  MS: moves all extremities without noticeable focal  abnormality PSYCH:  pleasant and cooperative, no obvious depression or anxiety bp log reviewed  Lab Results  Component Value Date   WBC 9.1 06/12/2018   HGB 13.1 06/12/2018   HCT 41.1 06/12/2018   PLT 211 06/12/2018   GLUCOSE 105 (H) 12/30/2018   CHOL 245 (A) 08/05/2019   TRIG 56 08/05/2019   HDL 81 (A) 08/05/2019   LDLDIRECT 165.2 12/27/2012   LDLCALC 155 08/05/2019   ALT 22 08/05/2019   AST 22 08/05/2019   NA 142 08/05/2019   K 4.3 08/05/2019   CL 104 08/05/2019   CREATININE 0.7 08/05/2019   BUN 18 08/05/2019   CO2 25 (A) 08/05/2019   TSH 1.51 12/30/2018   HGBA1C 5.2 06/01/2013   BP Readings from Last 3 Encounters:  10/26/19 (!) 148/78  02/10/19 140/82  12/17/18 139/74  get [place]  Lab review   Consider adding bp med or metoprolol back if needed   ASSESSMENT AND PLAN:  Discussed the following assessment and plan:  Tired  OSA (obstructive sleep apnea)  Medication management  Labile hypertension  Stress - family  Has also picked up covid weight    Agree with trying weight watchers  -Patient advised to return or notify health care team  if  new concerns arise.  Patient Instructions  Get copy of   Labs done recently.   Stress can certainly cause  Some of sx .  Get a CO monitor.lose 10 #.        Standley Brooking. Shruthi Northrup M.D.

## 2019-10-26 ENCOUNTER — Ambulatory Visit (INDEPENDENT_AMBULATORY_CARE_PROVIDER_SITE_OTHER): Payer: Medicare HMO | Admitting: Internal Medicine

## 2019-10-26 ENCOUNTER — Encounter: Payer: Self-pay | Admitting: Internal Medicine

## 2019-10-26 VITALS — BP 148/78 | HR 93 | Temp 98.0°F | Ht 66.0 in | Wt 208.8 lb

## 2019-10-26 DIAGNOSIS — R0989 Other specified symptoms and signs involving the circulatory and respiratory systems: Secondary | ICD-10-CM

## 2019-10-26 DIAGNOSIS — Z79899 Other long term (current) drug therapy: Secondary | ICD-10-CM | POA: Diagnosis not present

## 2019-10-26 DIAGNOSIS — R5383 Other fatigue: Secondary | ICD-10-CM

## 2019-10-26 DIAGNOSIS — G4733 Obstructive sleep apnea (adult) (pediatric): Secondary | ICD-10-CM | POA: Diagnosis not present

## 2019-10-26 DIAGNOSIS — F439 Reaction to severe stress, unspecified: Secondary | ICD-10-CM

## 2019-10-26 NOTE — Patient Instructions (Addendum)
Get copy of   Labs done recently.   Stress can certainly cause  Some of sx .  Get a CO monitor.lose 10 #.

## 2020-01-09 LAB — BASIC METABOLIC PANEL
BUN: 20 (ref 4–21)
CO2: 22 (ref 13–22)
Chloride: 104 (ref 99–108)
Creatinine: 0.8 (ref <=1.1)
Glucose: 91
Potassium: 4.4 (ref 3.4–5.3)
Sodium: 141 (ref 137–147)

## 2020-01-09 LAB — COMPREHENSIVE METABOLIC PANEL
Albumin: 4.2 (ref 3.5–5.0)
Calcium: 9.3 (ref 8.7–10.7)
GFR calc Af Amer: 95
GFR calc non Af Amer: 83
Globulin: 2

## 2020-01-09 LAB — HEPATIC FUNCTION PANEL
ALT: 40 — AB (ref 7–35)
AST: 37 — AB (ref 13–35)
Alkaline Phosphatase: 106 (ref 25–125)
Bilirubin, Total: 0.6

## 2020-01-10 LAB — TSH: TSH: 3.55 (ref 0.41–5.90)

## 2020-01-18 ENCOUNTER — Other Ambulatory Visit: Payer: Self-pay | Admitting: Internal Medicine

## 2020-04-03 ENCOUNTER — Other Ambulatory Visit: Payer: Self-pay | Admitting: Internal Medicine

## 2020-04-23 ENCOUNTER — Other Ambulatory Visit: Payer: Self-pay | Admitting: Internal Medicine

## 2020-05-03 ENCOUNTER — Telehealth: Payer: Self-pay

## 2020-05-03 NOTE — Telephone Encounter (Signed)
Have copy of MRI results and blood work that was left for MD review by patient also note from patient attached.   Will left in red folder for MD to review

## 2020-05-14 DIAGNOSIS — M531 Cervicobrachial syndrome: Secondary | ICD-10-CM | POA: Diagnosis not present

## 2020-05-14 DIAGNOSIS — M9902 Segmental and somatic dysfunction of thoracic region: Secondary | ICD-10-CM | POA: Diagnosis not present

## 2020-05-14 DIAGNOSIS — M9903 Segmental and somatic dysfunction of lumbar region: Secondary | ICD-10-CM | POA: Diagnosis not present

## 2020-05-14 DIAGNOSIS — M9901 Segmental and somatic dysfunction of cervical region: Secondary | ICD-10-CM | POA: Diagnosis not present

## 2020-05-15 DIAGNOSIS — M5416 Radiculopathy, lumbar region: Secondary | ICD-10-CM | POA: Diagnosis not present

## 2020-05-16 DIAGNOSIS — R0789 Other chest pain: Secondary | ICD-10-CM | POA: Diagnosis not present

## 2020-05-16 DIAGNOSIS — I10 Essential (primary) hypertension: Secondary | ICD-10-CM | POA: Diagnosis not present

## 2020-05-16 DIAGNOSIS — R079 Chest pain, unspecified: Secondary | ICD-10-CM | POA: Diagnosis not present

## 2020-05-16 NOTE — Telephone Encounter (Signed)
Patient called and given results below as noted by Dr. Fabian Sharp on 05/16/20, patient verbalized understanding. She says years ago she was told that she had a cyst, but nothing to worry about. She says she wanted Dr. Fabian Sharp to be aware. She says she's scheduled for an x-ray of her back in April, so maybe it will show up on that. She says she is fine not to repeat the ultrasound at this time, but she will ponder on it. She says to tell Dr. Fabian Sharp thank you and Happy New Year.

## 2020-05-16 NOTE — Telephone Encounter (Signed)
Apologies for the late response I was out of the office for 2 weeks in December. I looked at the paperwork and the mention of a renal cyst seems to be incidental and benign as they called it simple cyst unchanged. Usually simple cyst in unchanged related to a benign incidental finding.  Reviewed your imaging studies that are in our system and your last abdominal ultrasound which was 8 years ago did not show any renal cysts.  But is not necessarily concerning.  Can choose to repeat a renal ultrasound to confirm benign appearing cyst and delineate size.  But no urgency to this   Let us know if you want to proceed with renal ultrasound.

## 2020-05-17 NOTE — Telephone Encounter (Signed)
I agree with her decision.

## 2020-05-22 DIAGNOSIS — Z1231 Encounter for screening mammogram for malignant neoplasm of breast: Secondary | ICD-10-CM | POA: Diagnosis not present

## 2020-05-22 DIAGNOSIS — N819 Female genital prolapse, unspecified: Secondary | ICD-10-CM | POA: Diagnosis not present

## 2020-05-22 DIAGNOSIS — M5416 Radiculopathy, lumbar region: Secondary | ICD-10-CM | POA: Diagnosis not present

## 2020-05-23 ENCOUNTER — Other Ambulatory Visit: Payer: Self-pay | Admitting: Obstetrics and Gynecology

## 2020-05-23 DIAGNOSIS — R928 Other abnormal and inconclusive findings on diagnostic imaging of breast: Secondary | ICD-10-CM

## 2020-05-24 DIAGNOSIS — M531 Cervicobrachial syndrome: Secondary | ICD-10-CM | POA: Diagnosis not present

## 2020-05-24 DIAGNOSIS — M9902 Segmental and somatic dysfunction of thoracic region: Secondary | ICD-10-CM | POA: Diagnosis not present

## 2020-05-24 DIAGNOSIS — M9903 Segmental and somatic dysfunction of lumbar region: Secondary | ICD-10-CM | POA: Diagnosis not present

## 2020-05-24 DIAGNOSIS — M9901 Segmental and somatic dysfunction of cervical region: Secondary | ICD-10-CM | POA: Diagnosis not present

## 2020-05-30 DIAGNOSIS — N959 Unspecified menopausal and perimenopausal disorder: Secondary | ICD-10-CM | POA: Diagnosis not present

## 2020-05-30 DIAGNOSIS — E039 Hypothyroidism, unspecified: Secondary | ICD-10-CM | POA: Diagnosis not present

## 2020-05-30 DIAGNOSIS — I10 Essential (primary) hypertension: Secondary | ICD-10-CM | POA: Diagnosis not present

## 2020-05-30 DIAGNOSIS — E042 Nontoxic multinodular goiter: Secondary | ICD-10-CM | POA: Diagnosis not present

## 2020-05-30 DIAGNOSIS — E782 Mixed hyperlipidemia: Secondary | ICD-10-CM | POA: Diagnosis not present

## 2020-05-31 DIAGNOSIS — M531 Cervicobrachial syndrome: Secondary | ICD-10-CM | POA: Diagnosis not present

## 2020-05-31 DIAGNOSIS — M9902 Segmental and somatic dysfunction of thoracic region: Secondary | ICD-10-CM | POA: Diagnosis not present

## 2020-05-31 DIAGNOSIS — M9903 Segmental and somatic dysfunction of lumbar region: Secondary | ICD-10-CM | POA: Diagnosis not present

## 2020-05-31 DIAGNOSIS — M9901 Segmental and somatic dysfunction of cervical region: Secondary | ICD-10-CM | POA: Diagnosis not present

## 2020-06-02 DIAGNOSIS — I10 Essential (primary) hypertension: Secondary | ICD-10-CM | POA: Diagnosis not present

## 2020-06-02 DIAGNOSIS — G4733 Obstructive sleep apnea (adult) (pediatric): Secondary | ICD-10-CM | POA: Diagnosis not present

## 2020-06-04 DIAGNOSIS — M9901 Segmental and somatic dysfunction of cervical region: Secondary | ICD-10-CM | POA: Diagnosis not present

## 2020-06-04 DIAGNOSIS — M9902 Segmental and somatic dysfunction of thoracic region: Secondary | ICD-10-CM | POA: Diagnosis not present

## 2020-06-04 DIAGNOSIS — M531 Cervicobrachial syndrome: Secondary | ICD-10-CM | POA: Diagnosis not present

## 2020-06-04 DIAGNOSIS — M9903 Segmental and somatic dysfunction of lumbar region: Secondary | ICD-10-CM | POA: Diagnosis not present

## 2020-06-05 LAB — HEPATIC FUNCTION PANEL
ALT: 27 (ref 7–35)
AST: 26 (ref 13–35)

## 2020-06-05 LAB — LIPID PANEL
Cholesterol: 226 — AB (ref 0–200)
HDL: 57 (ref 35–70)
LDL Cholesterol: 156

## 2020-06-05 LAB — TSH: TSH: 2.33 (ref <=5.90)

## 2020-06-05 LAB — BASIC METABOLIC PANEL
BUN: 13 (ref 4–21)
Creatinine: 0.7 (ref <=1.1)
Glucose: 90

## 2020-06-05 LAB — COMPREHENSIVE METABOLIC PANEL: Calcium: 8.9 (ref 8.7–10.7)

## 2020-06-08 ENCOUNTER — Ambulatory Visit: Payer: Medicare HMO

## 2020-06-08 ENCOUNTER — Other Ambulatory Visit: Payer: Self-pay | Admitting: Obstetrics and Gynecology

## 2020-06-08 ENCOUNTER — Ambulatory Visit
Admission: RE | Admit: 2020-06-08 | Discharge: 2020-06-08 | Disposition: A | Discharge status: Home / Self Care | Payer: Medicare HMO | Source: Ambulatory Visit | Attending: Obstetrics and Gynecology | Admitting: Obstetrics and Gynecology

## 2020-06-08 ENCOUNTER — Other Ambulatory Visit: Payer: Self-pay

## 2020-06-08 DIAGNOSIS — R928 Other abnormal and inconclusive findings on diagnostic imaging of breast: Secondary | ICD-10-CM

## 2020-06-08 DIAGNOSIS — N6311 Unspecified lump in the right breast, upper outer quadrant: Secondary | ICD-10-CM | POA: Diagnosis not present

## 2020-06-08 DIAGNOSIS — N6312 Unspecified lump in the right breast, upper inner quadrant: Secondary | ICD-10-CM | POA: Diagnosis not present

## 2020-06-08 DIAGNOSIS — R921 Mammographic calcification found on diagnostic imaging of breast: Secondary | ICD-10-CM | POA: Diagnosis not present

## 2020-06-08 LAB — HM MAMMOGRAPHY

## 2020-06-11 ENCOUNTER — Ambulatory Visit (INDEPENDENT_AMBULATORY_CARE_PROVIDER_SITE_OTHER): Payer: Medicare HMO

## 2020-06-11 VITALS — BP 134/76

## 2020-06-11 DIAGNOSIS — Z0000 Encounter for general adult medical examination without abnormal findings: Secondary | ICD-10-CM

## 2020-06-11 NOTE — Progress Notes (Signed)
Subjective:   [name] is a 65 y.o. female who presents for an Initial Medicare Annual Wellness Visit.  Virtual Visit via Video Note  I connected with [name] by a video enabled telemedicine application and verified that I am speaking with the correct person using two identifiers.  Location: Patient: Home Provider: Office Persons participating in the virtual visit: patient, provider   I discussed the limitations of evaluation and management by telemedicine and the availability of in person appointments. The patient expressed understanding and agreed to proceed.     Larene Beach Alayna Mabe,LPN    Review of Systems    N/A  Cardiac Risk Factors include: advanced age (>1men, >87 women);hypertension     Objective:    Today's Vitals   06/11/20 1356  BP: 134/76   There is no height or weight on file to calculate BMI.  Advanced Directives 06/11/2020 12/17/2018 06/12/2018 01/09/2017 09/24/2015 09/24/2015 11/30/2013  Does Patient Have a Medical Advance Directive? No No No No No No Patient does not have advance directive  Would patient like information on creating a medical advance directive? No - Patient declined No - Patient declined No - Patient declined - No - patient declined information No - patient declined information -  Pre-existing out of facility DNR order (yellow form or pink MOST form) - - - - - - -    Current Medications (verified) Outpatient Encounter Medications as of 06/11/2020  Medication Sig  . Ascorbic Acid (VITAMIN C) 1000 MG tablet Take 1,000 mg by mouth daily.  . hydrochlorothiazide (HYDRODIURIL) 25 MG tablet TAKE 1 TABLET BY MOUTH EVERY DAY  . LORazepam (ATIVAN) 1 MG tablet Take 0.5-1 tablets (0.5-1 mg total) by mouth every 8 (eight) hours as needed for anxiety. anxiety  . MAGNESIUM CARBONATE PO Take 1 tablet by mouth daily.  . metoprolol succinate (TOPROL XL) 25 MG 24 hr tablet TAKE ONCE A DAY AS DIRECTED  . NON FORMULARY Thyroid desiccated (procine)  sr 2 capsules on Sunday Tuesday Thursday Saturday and 1 capsule Monday Wednesday friday  . OVER THE COUNTER MEDICATION Essential Pro  . OVER THE COUNTER MEDICATION Take 2 capsules by mouth daily. iflamed  . potassium chloride SA (KLOR-CON) 20 MEQ tablet TAKE ONE TABLET BY MOUTH ONE TIME DAILY  . Pseudoephedrine-guaiFENesin (GUAIFENESIN 600/PSE 120 PO) Take 1 tablet by mouth as needed.  . [DISCONTINUED] Progesterone Micronized (PROGESTERONE PO) progesterone sr 150mg  cap  TAKE ONE CAPSULE BY MOUTH AT BEDTIME   No facility-administered encounter medications on file as of 06/11/2020.    Allergies (verified) Penicillins, Cefdinir, Norvasc [amlodipine besylate], Cetirizine, Irbesartan, Molds & smuts, and Tylenol [acetaminophen]   History: Past Medical History:  Diagnosis Date  . Anal fissure   . Atrial septal aneurysm / if pfo  echo 6 13  10/23/2011  . Cataract    both eyes  . Depression   . Fatty liver    "pre fatty liver"  . Fibromyalgia   . Headache(784.0)    hx of migraines when younger  . Heart palpitations    hx with normal holter event monitoring  . Hx: UTI (urinary tract infection)   . Hyperlipidemia   . Hypertension   . Hypothyroidism   . Obesity   . Pneumonia 1972   hx of  . Polyp of colon   . 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 Skin cancer    basal, squamous cell   Past Surgical History:  Procedure Laterality Date  . COLONOSCOPY    .  DILATION AND CURETTAGE OF UTERUS  1978  . [place]  . KNEE ARTHROSCOPY     both in past  . POLYPECTOMY    . SHOULDER OPEN ROTATOR CUFF REPAIR Left 07/07/2013   Procedure: LEFT SHOULDER MINI OPEN SUBACROMIAL DECOMPRESSION ROTATOR CUFF REPAIR AND POSSIBLE PATCH GRAFT ;  Surgeon: Johnn Hai, MD;  Location: [facility];  Service: Orthopedics;  Laterality: Left;  with interscaline block  . SKIN CANCER EXCISION Bilateral    arm, legs, and chest  . TONSILLECTOMY     Family History  Problem Relation Age of Onset   . Hypertension Mother        low borderline  . Osteoporosis Mother   . Hypertension Father   . Liver disease Father        amyloid deceased  . Hyperlipidemia Father   . Melanoma Sister   . Cancer Sister   . Juvenile Diabetes Daughter   . ADD / ADHD Child   . Hyperlipidemia Other        Maternal grandmother  . Colon cancer Neg Hx   . Stomach cancer Neg Hx   . Colon polyps Neg Hx   . Esophageal cancer Neg Hx   . Rectal cancer Neg Hx    Social History   Socioeconomic History  . Marital status: Married    Spouse name: Not on file  . Number of children: 6  . Years of education: Not on file  . Highest education level: Not on file  Occupational History  . Occupation: TEACHER ASST    Employer: [employer]  Tobacco Use  . Smoking status: Never Smoker  . Smokeless tobacco: Never Used  Vaping Use  . Vaping Use: Never used  Substance and Sexual Activity  . Alcohol use: Yes    Comment: rare  . Drug use: No  . Sexual activity: Not on file  Other Topics Concern  . Not on file  Social History Narrative   Teachers [employer]  Not workking since last February . 15 left shoulder surgery    Married   Special educ   HH of 4   3 outside dogs, 8 goats, 40 chickens and quail. [place]   Job stresses   Ex husband passes away   Not working after injur at school  Shoulder neck                Social Determinants of Health   Financial Resource Strain: [place]   . Difficulty of Paying Living Expenses: Not hard at all  Food Insecurity: No Food Insecurity  . Worried About Charity fundraiser in the Last Year: Never true  . Ran Out of Food in the Last Year: Never true  Transportation Needs: No Transportation Needs  . Lack of Transportation (Medical): No  . Lack of Transportation (Non-Medical): No  Physical Activity: Inactive  . Days of Exercise per Week: 0 days  . Minutes of Exercise per Session: 0 min  Stress: No Stress Concern Present  .  Feeling of Stress : Not at all  Social Connections: Moderately Integrated  . Frequency of Communication with Friends and Family: More than three times a week  . Frequency of Social Gatherings with Friends and Family: More than three times a week  . Attends Religious Services: More than 4 times per year  . Active Member of Clubs or Organizations: No  . Attends Archivist Meetings: Never  .  Marital Status: Married    Tobacco Counseling Counseling given: Not Answered   Clinical Intake:  Pre-visit preparation completed: Yes  Pain : No/denies pain     Nutritional Risks: None Diabetes: No  How often do you need to have someone help you when you read instructions, pamphlets, or other written materials from your doctor or pharmacy?: 1 - Never What is the last grade level you completed in school?: Bachelors Degree  Diabetic?No   Interpreter Needed?: No  Information entered by :: [place] of Daily Living In your present state of health, do you have any difficulty performing the following activities: 06/11/2020  Hearing? N  Vision? N  Difficulty concentrating or making decisions? N  Walking or climbing stairs? N  Comment has pain in left knee when going up stairs  Dressing or bathing? N  Doing errands, shopping? N  Preparing Food and eating ? N  Using the Toilet? N  In the past six months, have you accidently leaked urine? Y  Comment has some issues with urge incontienence. wears a pad  Do you have problems with loss of bowel control? N  Managing your Medications? N  Managing your Finances? N  Housekeeping or managing your Housekeeping? N  Some recent data might be hidden    Patient Care Team: Panosh, Standley Brooking, MD as PCP - General Fay Records, MD as PCP - Cardiology (Cardiology) Clance, Armando Reichert, MD (Pulmonary Disease) Arvella Nigh, MD as Consulting Physician (Obstetrics and Gynecology) Dr Peggye Form, MD as Consulting Physician  (Orthopedic Surgery) Ladene Artist, MD as Consulting Physician (Gastroenterology) Melina Schools, MD as Consulting Physician (Orthopedic Surgery)  Indicate any recent Medical Services you may have received from other than [facility] providers in the past year (date may be approximate).     Assessment:   This is a routine wellness examination for [name].  Hearing/Vision screen  Hearing Screening   125Hz  250Hz  500Hz  1000Hz  2000Hz  3000Hz  4000Hz  6000Hz  8000Hz   Right ear:           Left ear:           Vision Screening Comments: Patient states gets eyes examined once per year. Wears glasses. Has cataracts but surgery not needed at this time    Dietary issues and exercise activities discussed: Current Exercise Habits: The patient does not participate in regular exercise at present  Goals    . Exercise 150 min/wk Moderate Activity    . Patient Stated     I want to make scrapbooks for my entire family.    . Patient Stated     I want to be more mindful and keep stress down       Depression Screen PHQ 2/9 Scores 06/11/2020 07/30/2016 09/24/2015 08/20/2015  PHQ - 2 Score 0 0 0 0  PHQ- 9 Score 0 - - -    Fall Risk Fall Risk  06/11/2020 07/30/2016 09/24/2015  Falls in the past year? 0 No No  Number falls in past yr: 0 - -  Injury with Fall? 0 - -  Risk for fall due to : No Fall Risks - -  Follow up Falls evaluation completed;Falls prevention discussed - -    FALL RISK PREVENTION PERTAINING TO THE HOME:  Any stairs in or around the home? No  If so, are there any without handrails? No  Home free of loose throw rugs in walkways, pet beds, electrical cords, etc? Yes  Adequate lighting in your home to  reduce risk of falls? Yes   ASSISTIVE DEVICES UTILIZED TO PREVENT FALLS:  Life alert? No  Use of a cane, walker or w/c? No  Grab bars in the bathroom? No  Shower chair or bench in shower? No  Elevated toilet seat or a handicapped toilet? No     Cognitive Function:   Normal cognitive  status assessed by direct observation by this Nurse Health Advisor. No abnormalities found.        Immunizations Immunization History  Administered Date(s) Administered  . Hep A / Hep B 08/02/2014, 09/20/2014, 06/20/2015  . Influenza Split 03/11/2011  . Influenza Whole 02/18/2010  . Td 01/10/1998  . Tdap 03/28/2014    TDAP status: Up to date  Flu Vaccine status: Due, Education has been provided regarding the importance of this vaccine. Advised may receive this vaccine at local pharmacy or Health Dept. Aware to provide a copy of the vaccination record if obtained from local pharmacy or Health Dept. Verbalized acceptance and understanding.  Pneumococcal vaccine status: Due, Education has been provided regarding the importance of this vaccine. Advised may receive this vaccine at local pharmacy or Health Dept. Aware to provide a copy of the vaccination record if obtained from local pharmacy or Health Dept. Verbalized acceptance and understanding.  Covid-19 vaccine status: Declined, Education has been provided regarding the importance of this vaccine but patient still declined. Advised may receive this vaccine at local pharmacy or Health Dept.or vaccine clinic. Aware to provide a copy of the vaccination record if obtained from local pharmacy or Health Dept. Verbalized acceptance and understanding.  Qualifies for Shingles Vaccine? Yes   Zostavax completed No   Shingrix Completed?: No.    Education has been provided regarding the importance of this vaccine. Patient has been advised to call insurance company to determine out of pocket expense if they have not yet received this vaccine. Advised may also receive vaccine at local pharmacy or Health Dept. Verbalized acceptance and understanding.  Screening Tests Health Maintenance  Topic Date Due  . COVID-19 Vaccine (1) Never done  . URINE MICROALBUMIN  Never done  . DEXA SCAN  Never done  . PNA vac Low Risk Adult (1 of 2 - PCV13) Never done  .  INFLUENZA VACCINE  12/11/2019  . COLONOSCOPY (Pts 45-64yrs Insurance coverage will need to be confirmed)  01/26/2022  . MAMMOGRAM  06/08/2022  . TETANUS/TDAP  03/28/2024  . Hepatitis C Screening  Completed    Health Maintenance  Health Maintenance Due  Topic Date Due  . COVID-19 Vaccine (1) Never done  . URINE MICROALBUMIN  Never done  . DEXA SCAN  Never done  . PNA vac Low Risk Adult (1 of 2 - PCV13) Never done  . INFLUENZA VACCINE  12/11/2019    Colorectal cancer screening: Type of screening: Colonoscopy. Completed 01/26/2017. Repeat every 5 years  Mammogram status: Completed 06/08/2020. Repeat every year  Bone Density status: Ordered 06/11/2020. Pt provided with contact info and advised to call to schedule appt.  Lung Cancer Screening: (Low Dose CT Chest recommended if Age 70-80 years, 30 pack-year currently smoking OR have quit w/in 15years.) does not qualify.   Lung Cancer Screening Referral: N/A   Additional Screening:  Hepatitis C Screening: does qualify; Completed 08/10/2014   Vision Screening: Recommended annual ophthalmology exams for early detection of glaucoma and other disorders of the eye. Is the patient up to date with their annual eye exam?  Yes  Who is the provider or what is the  name of the office in which the patient attends annual eye exams? Dr[facility]Thurman at [facility]  If pt is not established with a provider, would they like to be referred to a provider to establish care? No .   Dental Screening: Recommended annual dental exams for proper oral hygiene  Community Resource Referral / Chronic Care Management: CRR required this visit?  No   CCM required this visit?  No      Plan:     I have personally reviewed and noted the following in the patient's chart:   . Medical and social history . Use of alcohol, tobacco or illicit drugs  . Current medications and supplements . Functional ability and status . Nutritional status . Physical  activity . Advanced directives . List of other physicians . Hospitalizations, surgeries, and ER visits in previous 12 months . Vitals . Screenings to include cognitive, depression, and falls . Referrals and appointments  In addition, I have reviewed and discussed with patient certain preventive protocols, quality metrics, and best practice recommendations. A written personalized care plan for preventive services as well as general preventive health recommendations were provided to patient.     Ofilia Neas, LPN   [id number]   Nurse Notes: None

## 2020-06-11 NOTE — Patient Instructions (Signed)
[name] , Thank you for taking time to come for your Medicare Wellness Visit. I appreciate your ongoing commitment to your health goals. Please review the following plan we discussed and let me know if I can assist you in the future.   Screening recommendations/referrals: Colonoscopy: Up to date, next due 01/26/2022 Mammogram: Up to date, next due 05/2021 Bone Density: Up to date, please have your GYN fax [place] your last Bone Density  Recommended yearly ophthalmology/optometry visit for glaucoma screening and checkup Recommended yearly dental visit for hygiene and checkup  Vaccinations: Influenza vaccine: Patient declined  Pneumococcal vaccine: Patient declined  Tdap vaccine: Up to date, next 03/28/2024 Shingles vaccine: Currently due, if you wish to receive we recommend you get at your pharmacy     Advanced directives: Advance directive discussed with you today. Even though you declined this today please call our office should you change your mind and we can give you the proper paperwork for you to fill out.   Conditions/risks identified: None   Next appointment: None    Preventive Care 65 Years and Older, Female Preventive care refers to lifestyle choices and visits with your health care provider that can promote health and wellness. What does preventive care include?  A yearly physical exam. This is also called an annual well check.  Dental exams once or twice a year.  Routine eye exams. Ask your health care provider how often you should have your eyes checked.  Personal lifestyle choices, including:  Daily care of your teeth and gums.  Regular physical activity.  Eating a healthy diet.  Avoiding tobacco and drug use.  Limiting alcohol use.  Practicing safe sex.  Taking low-dose aspirin every day.  Taking vitamin and mineral supplements as recommended by your health care provider. What happens during an annual well check? The services and screenings done by  your health care provider during your annual well check will depend on your age, overall health, lifestyle risk factors, and family history of disease. Counseling  Your health care provider may ask you questions about your:  Alcohol use.  Tobacco use.  Drug use.  Emotional well-being.  Home and relationship well-being.  Sexual activity.  Eating habits.  History of falls.  Memory and ability to understand (cognition).  Work and work Statistician.  Reproductive health. Screening  You may have the following tests or measurements:  Height, weight, and BMI.  Blood pressure.  Lipid and cholesterol levels. These may be checked every 5 years, or more frequently if you are over 42 years old.  Skin check.  Lung cancer screening. You may have this screening every year starting at age 59 if you have a 30-pack-year history of smoking and currently smoke or have quit within the past 15 years.  Fecal occult blood test (FOBT) of the stool. You may have this test every year starting at age 70.  Flexible sigmoidoscopy or colonoscopy. You may have a sigmoidoscopy every 5 years or a colonoscopy every 10 years starting at age 80.  Hepatitis C blood test.  Hepatitis B blood test.  Sexually transmitted disease (STD) testing.  Diabetes screening. This is done by checking your blood sugar (glucose) after you have not eaten for a while (fasting). You may have this done every 1-3 years.  Bone density scan. This is done to screen for osteoporosis. You may have this done starting at age 66.  Mammogram. This may be done every 1-2 years. Talk to your health care provider about how  often you should have regular mammograms. Talk with your health care provider about your test results, treatment options, and if necessary, the need for more tests. Vaccines  Your health care provider may recommend certain vaccines, such as:  Influenza vaccine. This is recommended every year.  Tetanus,  diphtheria, and acellular pertussis (Tdap, Td) vaccine. You may need a Td booster every 10 years.  Zoster vaccine. You may need this after age 82.  Pneumococcal 13-valent conjugate (PCV13) vaccine. One dose is recommended after age 1.  Pneumococcal polysaccharide (PPSV23) vaccine. One dose is recommended after age 79. Talk to your health care provider about which screenings and vaccines you need and how often you need them. This information is not intended to replace advice given to you by your health care provider. Make sure you discuss any questions you have with your health care provider. Document Released: 05/25/2015 Document Revised: 01/16/2016 Document Reviewed: 02/27/2015 Elsevier Interactive Patient Education  2017 [place] Prevention in the Home Falls can cause injuries. They can happen to people of all ages. There are many things you can do to make your home safe and to help prevent falls. What can I do on the outside of my home?  Regularly fix the edges of walkways and driveways and fix any cracks.  Remove anything that might make you trip as you walk through a door, such as a raised step or threshold.  Trim any bushes or trees on the path to your home.  Use bright outdoor lighting.  Clear any walking paths of anything that might make someone trip, such as rocks or tools.  Regularly check to see if handrails are loose or broken. Make sure that both sides of any steps have handrails.  Any raised decks and porches should have guardrails on the edges.  Have any leaves, snow, or ice cleared regularly.  Use sand or salt on walking paths during winter.  Clean up any spills in your garage right away. This includes oil or grease spills. What can I do in the bathroom?  Use night lights.  Install grab bars by the toilet and in the tub and shower. Do not use towel bars as grab bars.  Use non-skid mats or decals in the tub or shower.  If you need to sit down in  the shower, use a plastic, non-slip stool.  Keep the floor dry. Clean up any water that spills on the floor as soon as it happens.  Remove soap buildup in the tub or shower regularly.  Attach bath mats securely with double-sided non-slip rug tape.  Do not have throw rugs and other things on the floor that can make you trip. What can I do in the bedroom?  Use night lights.  Make sure that you have a light by your bed that is easy to reach.  Do not use any sheets or blankets that are too big for your bed. They should not hang down onto the floor.  Have a firm chair that has side arms. You can use this for support while you get dressed.  Do not have throw rugs and other things on the floor that can make you trip. What can I do in the kitchen?  Clean up any spills right away.  Avoid walking on wet floors.  Keep items that you use a lot in easy-to-reach places.  If you need to reach something above you, use a strong step stool that has a grab bar.  Keep electrical  cords out of the way.  Do not use floor polish or wax that makes floors slippery. If you must use wax, use non-skid floor wax.  Do not have throw rugs and other things on the floor that can make you trip. What can I do with my stairs?  Do not leave any items on the stairs.  Make sure that there are handrails on both sides of the stairs and use them. Fix handrails that are broken or loose. Make sure that handrails are as long as the stairways.  Check any carpeting to make sure that it is firmly attached to the stairs. Fix any carpet that is loose or worn.  Avoid having throw rugs at the top or bottom of the stairs. If you do have throw rugs, attach them to the floor with carpet tape.  Make sure that you have a light switch at the top of the stairs and the bottom of the stairs. If you do not have them, ask someone to add them for you. What else can I do to help prevent falls?  Wear shoes that:  Do not have high  heels.  Have rubber bottoms.  Are comfortable and fit you well.  Are closed at the toe. Do not wear sandals.  If you use a stepladder:  Make sure that it is fully opened. Do not climb a closed stepladder.  Make sure that both sides of the stepladder are locked into place.  Ask someone to hold it for you, if possible.  Clearly mark and make sure that you can see:  Any grab bars or handrails.  First and last steps.  Where the edge of each step is.  Use tools that help you move around (mobility aids) if they are needed. These include:  Canes.  Walkers.  Scooters.  Crutches.  Turn on the lights when you go into a dark area. Replace any light bulbs as soon as they burn out.  Set up your furniture so you have a clear path. Avoid moving your furniture around.  If any of your floors are uneven, fix them.  If there are any pets around you, be aware of where they are.  Review your medicines with your doctor. Some medicines can make you feel dizzy. This can increase your chance of falling. Ask your doctor what other things that you can do to help prevent falls. This information is not intended to replace advice given to you by your health care provider. Make sure you discuss any questions you have with your health care provider. Document Released: 02/22/2009 Document Revised: 10/04/2015 Document Reviewed: 06/02/2014 Elsevier Interactive Patient Education  2017 [employer].

## 2020-06-18 DIAGNOSIS — R110 Nausea: Secondary | ICD-10-CM | POA: Diagnosis not present

## 2020-06-18 DIAGNOSIS — I700 Atherosclerosis of aorta: Secondary | ICD-10-CM | POA: Diagnosis not present

## 2020-06-18 DIAGNOSIS — R109 Unspecified abdominal pain: Secondary | ICD-10-CM | POA: Diagnosis not present

## 2020-06-18 DIAGNOSIS — I10 Essential (primary) hypertension: Secondary | ICD-10-CM | POA: Diagnosis not present

## 2020-06-18 DIAGNOSIS — R1032 Left lower quadrant pain: Secondary | ICD-10-CM | POA: Diagnosis not present

## 2020-06-18 DIAGNOSIS — K529 Noninfective gastroenteritis and colitis, unspecified: Secondary | ICD-10-CM | POA: Diagnosis not present

## 2020-06-18 DIAGNOSIS — R1111 Vomiting without nausea: Secondary | ICD-10-CM | POA: Diagnosis not present

## 2020-06-18 DIAGNOSIS — I959 Hypotension, unspecified: Secondary | ICD-10-CM | POA: Diagnosis not present

## 2020-06-18 DIAGNOSIS — R000 Tachycardia, unspecified: Secondary | ICD-10-CM | POA: Diagnosis not present

## 2020-06-20 ENCOUNTER — Encounter: Payer: Self-pay | Admitting: Internal Medicine

## 2020-06-20 DIAGNOSIS — M531 Cervicobrachial syndrome: Secondary | ICD-10-CM | POA: Diagnosis not present

## 2020-06-20 DIAGNOSIS — M9903 Segmental and somatic dysfunction of lumbar region: Secondary | ICD-10-CM | POA: Diagnosis not present

## 2020-06-20 DIAGNOSIS — M9901 Segmental and somatic dysfunction of cervical region: Secondary | ICD-10-CM | POA: Diagnosis not present

## 2020-06-20 DIAGNOSIS — M9902 Segmental and somatic dysfunction of thoracic region: Secondary | ICD-10-CM | POA: Diagnosis not present

## 2020-06-21 ENCOUNTER — Ambulatory Visit
Admission: RE | Admit: 2020-06-21 | Discharge: 2020-06-21 | Disposition: A | Discharge status: Home / Self Care | Payer: Medicare HMO | Source: Ambulatory Visit | Attending: Obstetrics and Gynecology | Admitting: Obstetrics and Gynecology

## 2020-06-21 ENCOUNTER — Encounter: Payer: Self-pay | Admitting: Internal Medicine

## 2020-06-21 ENCOUNTER — Other Ambulatory Visit: Payer: Self-pay | Admitting: Obstetrics and Gynecology

## 2020-06-21 ENCOUNTER — Other Ambulatory Visit: Payer: Self-pay

## 2020-06-21 DIAGNOSIS — R928 Other abnormal and inconclusive findings on diagnostic imaging of breast: Secondary | ICD-10-CM

## 2020-06-21 DIAGNOSIS — N6312 Unspecified lump in the right breast, upper inner quadrant: Secondary | ICD-10-CM | POA: Diagnosis not present

## 2020-06-21 DIAGNOSIS — C50811 Malignant neoplasm of overlapping sites of right female breast: Secondary | ICD-10-CM | POA: Diagnosis not present

## 2020-06-21 DIAGNOSIS — Z170 Estrogen receptor positive status [ER+]: Secondary | ICD-10-CM | POA: Diagnosis not present

## 2020-06-21 DIAGNOSIS — R921 Mammographic calcification found on diagnostic imaging of breast: Secondary | ICD-10-CM | POA: Diagnosis not present

## 2020-06-21 DIAGNOSIS — D0582 Other specified type of carcinoma in situ of left breast: Secondary | ICD-10-CM | POA: Diagnosis not present

## 2020-06-22 ENCOUNTER — Telehealth: Payer: Self-pay | Admitting: Hematology

## 2020-06-22 NOTE — Telephone Encounter (Signed)
Spoke to patient to confirm afternoon clinic appointment for 2/16, packet emailed to patient

## 2020-06-25 ENCOUNTER — Encounter: Payer: Self-pay | Admitting: *Deleted

## 2020-06-25 DIAGNOSIS — Z170 Estrogen receptor positive status [ER+]: Secondary | ICD-10-CM | POA: Insufficient documentation

## 2020-06-25 DIAGNOSIS — D0512 Intraductal carcinoma in situ of left breast: Secondary | ICD-10-CM

## 2020-06-25 DIAGNOSIS — C50411 Malignant neoplasm of upper-outer quadrant of right female breast: Secondary | ICD-10-CM

## 2020-06-26 ENCOUNTER — Encounter: Payer: Self-pay | Admitting: Internal Medicine

## 2020-06-27 ENCOUNTER — Encounter: Payer: Self-pay | Admitting: Radiation Oncology

## 2020-06-27 ENCOUNTER — Inpatient Hospital Stay: Payer: Medicare HMO

## 2020-06-27 ENCOUNTER — Encounter: Payer: Self-pay | Admitting: Physical Therapy

## 2020-06-27 ENCOUNTER — Encounter: Payer: Self-pay | Admitting: Hematology

## 2020-06-27 ENCOUNTER — Ambulatory Visit
Admission: RE | Admit: 2020-06-27 | Discharge: 2020-06-27 | Disposition: A | Discharge status: Home / Self Care | Payer: Medicare HMO | Source: Ambulatory Visit | Attending: Radiation Oncology | Admitting: Radiation Oncology

## 2020-06-27 ENCOUNTER — Ambulatory Visit: Payer: Medicare HMO | Attending: General Surgery | Admitting: Physical Therapy

## 2020-06-27 ENCOUNTER — Other Ambulatory Visit: Payer: Self-pay

## 2020-06-27 ENCOUNTER — Inpatient Hospital Stay: Payer: Medicare HMO | Attending: Hematology | Admitting: Hematology

## 2020-06-27 ENCOUNTER — Ambulatory Visit: Payer: Self-pay | Admitting: General Surgery

## 2020-06-27 ENCOUNTER — Other Ambulatory Visit: Payer: Self-pay | Admitting: *Deleted

## 2020-06-27 VITALS — BP 150/63 | HR 96 | Temp 97.4°F | Resp 20 | Ht 66.0 in | Wt 203.4 lb

## 2020-06-27 DIAGNOSIS — K760 Fatty (change of) liver, not elsewhere classified: Secondary | ICD-10-CM | POA: Insufficient documentation

## 2020-06-27 DIAGNOSIS — E785 Hyperlipidemia, unspecified: Secondary | ICD-10-CM | POA: Insufficient documentation

## 2020-06-27 DIAGNOSIS — I10 Essential (primary) hypertension: Secondary | ICD-10-CM | POA: Diagnosis not present

## 2020-06-27 DIAGNOSIS — E039 Hypothyroidism, unspecified: Secondary | ICD-10-CM | POA: Diagnosis not present

## 2020-06-27 DIAGNOSIS — Z170 Estrogen receptor positive status [ER+]: Secondary | ICD-10-CM

## 2020-06-27 DIAGNOSIS — F329 Major depressive disorder, single episode, unspecified: Secondary | ICD-10-CM | POA: Insufficient documentation

## 2020-06-27 DIAGNOSIS — C50411 Malignant neoplasm of upper-outer quadrant of right female breast: Secondary | ICD-10-CM

## 2020-06-27 DIAGNOSIS — E669 Obesity, unspecified: Secondary | ICD-10-CM | POA: Insufficient documentation

## 2020-06-27 DIAGNOSIS — R293 Abnormal posture: Secondary | ICD-10-CM | POA: Diagnosis not present

## 2020-06-27 DIAGNOSIS — Z86010 Personal history of colonic polyps: Secondary | ICD-10-CM | POA: Diagnosis not present

## 2020-06-27 DIAGNOSIS — M797 Fibromyalgia: Secondary | ICD-10-CM | POA: Diagnosis not present

## 2020-06-27 DIAGNOSIS — F419 Anxiety disorder, unspecified: Secondary | ICD-10-CM | POA: Insufficient documentation

## 2020-06-27 DIAGNOSIS — D0512 Intraductal carcinoma in situ of left breast: Secondary | ICD-10-CM

## 2020-06-27 DIAGNOSIS — Z79899 Other long term (current) drug therapy: Secondary | ICD-10-CM | POA: Insufficient documentation

## 2020-06-27 DIAGNOSIS — Z85828 Personal history of other malignant neoplasm of skin: Secondary | ICD-10-CM | POA: Diagnosis not present

## 2020-06-27 DIAGNOSIS — R69 Illness, unspecified: Secondary | ICD-10-CM | POA: Diagnosis not present

## 2020-06-27 LAB — CBC WITH DIFFERENTIAL (CANCER CENTER ONLY)
Abs Immature Granulocytes: 0.03 10*3/uL (ref 0.00–0.07)
Basophils Absolute: 0.1 10*3/uL (ref 0.0–0.1)
Basophils Relative: 1 %
Eosinophils Absolute: 0.1 10*3/uL (ref 0.0–0.5)
Eosinophils Relative: 2 %
HCT: 41.6 % (ref 36.0–46.0)
Hemoglobin: 13.9 g/dL (ref 12.0–15.0)
Immature Granulocytes: 0 %
Lymphocytes Relative: 26 %
Lymphs Abs: 2.2 10*3/uL (ref 0.7–4.0)
MCH: 30.2 pg (ref 26.0–34.0)
MCHC: 33.4 g/dL (ref 30.0–36.0)
MCV: 90.4 fL (ref 80.0–100.0)
Monocytes Absolute: 0.6 10*3/uL (ref 0.1–1.0)
Monocytes Relative: 7 %
Neutro Abs: 5.3 10*3/uL (ref 1.7–7.7)
Neutrophils Relative %: 64 %
Platelet Count: 245 10*3/uL (ref 150–400)
RBC: 4.6 MIL/uL (ref 3.87–5.11)
RDW: 13.7 % (ref 11.5–15.5)
WBC Count: 8.3 10*3/uL (ref 4.0–10.5)
nRBC: 0 % (ref 0.0–0.2)

## 2020-06-27 LAB — CMP (CANCER CENTER ONLY)
ALT: 69 U/L — ABNORMAL HIGH (ref 0–44)
AST: 49 U/L — ABNORMAL HIGH (ref 15–41)
Albumin: 4.3 g/dL (ref 3.5–5.0)
Alkaline Phosphatase: 113 U/L (ref 38–126)
Anion gap: 11 (ref 5–15)
BUN: 18 mg/dL (ref 8–23)
CO2: 29 mmol/L (ref 22–32)
Calcium: 9.5 mg/dL (ref 8.9–10.3)
Chloride: 102 mmol/L (ref 98–111)
Creatinine: 0.88 mg/dL (ref 0.44–1.00)
GFR, Estimated: >60 mL/min (ref >60)
Glucose, Bld: 93 mg/dL (ref 70–99)
Potassium: 3.9 mmol/L (ref 3.5–5.1)
Sodium: 142 mmol/L (ref 135–145)
Total Bilirubin: 0.8 mg/dL (ref 0.3–1.2)
Total Protein: 7.6 g/dL (ref 6.5–8.1)

## 2020-06-27 LAB — GENETIC SCREENING ORDER
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
## 2020-06-27 NOTE — Progress Notes (Signed)
Radiation Oncology         [phone] ________________________________  Initial Outpatient Consultation  Name: [name] MRN: [id number]  Date: 06/27/2020  DOB: May 19, 1955  [id number], Standley Brooking, MD  Jovita Kussmaul, MD   REFERRING PHYSICIAN: Jovita Kussmaul, MD  DIAGNOSIS:    ICD-10-CM   1. Ductal carcinoma in situ (DCIS) of left breast  D05.12   2. Malignant neoplasm of upper-outer quadrant of right breast in female, estrogen receptor positive ([place])  C50.411    Z17.0     Cancer Staging Ductal carcinoma in situ (DCIS) of left breast Staging form: Breast, AJCC 8th Edition - Clinical stage from 06/21/2020: Stage 0 (cTis (DCIS), cN0, cM0, G2, ER+, PR+, HER2: Not Assessed) - Signed by Truitt Merle, MD on 06/26/2020 Stage prefix: Initial diagnosis  Malignant neoplasm of upper-outer quadrant of right breast in female, estrogen receptor positive ([place]) Staging form: Breast, AJCC 8th Edition - Clinical stage from 06/21/2020: Stage IA (cT1b, cN0, cM0, G2, ER+, PR+, HER2-) - Signed by Truitt Merle, MD on 06/26/2020 Stage prefix: Initial diagnosis     CHIEF COMPLAINT: Here to discuss management of bilateral breast cancer  HISTORY OF PRESENT ILLNESS::[name] is a 65 y.o. female who presented with bilateral breast abnormalities on the following imaging: bilateral screening mammogram. No symptoms were reported at that time. Ultrasound of breasts on 06/08/2020 revealed a 9 mm mass in the 12 o'clock position of the right breast with imaging features highly suspicious for malignancy. It also showed a 4 mm group of indeterminate calcifications in the 12 o'clock position of the left breast and a 4 mm group of indeterminate calcifications in the 1 o'clock position of the left breast. Biopsy on the date of 06/21/2020 showed the following: 1. Right breast mass at the 12 o'clock position: invasive mammary carcinoma with mammary carcinoma in situ. E-cadherin was positive, supporting a ductal origin.  ER status: 95% strong; PR status: 40% moderate; Her2 status: negative; Grade: 2. 2. Left breast mass at the 12 o'clock position: mammary carcinoma in situ with necrosis and calcifications. E-cadherin positive, supporting a ductal origin. ER status: 95% strong; PR status: 30% strong; Grade 2. 3. Left breast mass at the 1 o'clock position: mammary carcinoma in situ with necrosis and calcifications. E-cadherin was positive, supporting a ductal origin.  She reports that she eats a very healthy diet.  She reports that she was on progesterone therapy but has stopped this since her diagnosis.     PREVIOUS RADIATION THERAPY: No  PAST MEDICAL HISTORY:  has a past medical history of Anal fissure, Atrial septal aneurysm / if pfo  echo 6 13  (10/23/2011), Breast cancer ([place]), Cataract, Depression, Fatty liver, Fibromyalgia, Headache(784.0), Heart palpitations, UTI (urinary tract infection), Hyperlipidemia, Hypertension, Hypothyroidism, Obesity, Pneumonia (1972), Polyp of colon, Serrated adenoma of colon (08/2012), and Skin cancer.    PAST SURGICAL HISTORY: Past Surgical History:  Procedure Laterality Date  . COLONOSCOPY    . DILATION AND CURETTAGE OF UTERUS  1978  . [place]  . KNEE ARTHROSCOPY     both in past  . POLYPECTOMY    . SHOULDER OPEN ROTATOR CUFF REPAIR Left 07/07/2013   Procedure: LEFT SHOULDER MINI OPEN SUBACROMIAL DECOMPRESSION ROTATOR CUFF REPAIR AND POSSIBLE PATCH GRAFT ;  Surgeon: Johnn Hai, MD;  Location: [facility];  Service: Orthopedics;  Laterality: Left;  with interscaline block  . SKIN CANCER EXCISION Bilateral    arm, legs, and chest  . TONSILLECTOMY  FAMILY HISTORY: family history includes ADD / ADHD in her child; Cancer in her sister; Hyperlipidemia in her father and another family member; Hypertension in her father and mother; Juvenile Diabetes in her daughter; Liver disease in her father; Melanoma in her sister; Osteoporosis in her  mother.  SOCIAL HISTORY:  reports that she has never smoked. She has never used smokeless tobacco. She reports current alcohol use. She reports that she does not use drugs.  ALLERGIES: Penicillins, Cefdinir, Norvasc [amlodipine besylate], Cetirizine, Irbesartan, Molds & smuts, and Tylenol [acetaminophen]  MEDICATIONS:  Current Outpatient Medications  Medication Sig Dispense Refill  . Ascorbic Acid (VITAMIN C) 1000 MG tablet Take 1,000 mg by mouth 2 (two) times daily.    . hydrochlorothiazide (HYDRODIURIL) 25 MG tablet TAKE 1 TABLET BY MOUTH EVERY DAY 90 tablet 0  . hydroxychloroquine (PLAQUENIL) 200 MG tablet Take 200 mg by mouth 2 (two) times a week.    Marland Kitchen LORazepam (ATIVAN) 1 MG tablet Take 0.5-1 tablets (0.5-1 mg total) by mouth every 8 (eight) hours as needed for anxiety. anxiety 20 tablet 0  . MAGNESIUM CARBONATE PO Take 1 tablet by mouth daily.    . melatonin 5 MG TABS Take 5 mg by mouth at bedtime.    . metoprolol succinate (TOPROL XL) 25 MG 24 hr tablet TAKE ONCE A DAY AS DIRECTED 30 tablet 1  . Multiple Vitamin (MULTIVITAMIN) tablet Take 1 tablet by mouth daily.    . NON FORMULARY 48.6 mg. Thyroid desiccated (procine) sr 2 capsules 5 days a week, 3 capsules 2 days per week    . OLIVE LEAF EXTRACT PO Take by mouth.    Marland Kitchen OVER THE COUNTER MEDICATION Essential Pro    . OVER THE COUNTER MEDICATION Take 2 capsules by mouth daily. iflamed    . potassium chloride SA (KLOR-CON) 20 MEQ tablet TAKE ONE TABLET BY MOUTH ONE TIME DAILY 90 tablet 0  . Probiotic Product (PROBIOTIC PO) Take by mouth.    . Pseudoephedrine-guaiFENesin (GUAIFENESIN 600/PSE 120 PO) Take 1 tablet by mouth as needed.    . zinc gluconate 50 MG tablet Take 50 mg by mouth daily.     No current facility-administered medications for this encounter.    REVIEW OF SYSTEMS: as above  PHYSICAL EXAM:  vitals were not taken for this visit.   General: Alert and oriented, in no acute distress HEENT: Head is normocephalic.   Skin: Bilateral breast ecchymoses from biopsies  Psychiatric: Judgment and insight are intact. Affect is appropriate. Breasts: Diffuse central lumpiness and associated ecchymoses of the left breast consistent with previous biopsies.  In the right breast she has lumpiness and ecchymoses in the upper outer quadrant consistent with previous biopsies. No other palpable masses appreciated in the breasts or axillae bilaterally.   ECOG = 0  0 - Asymptomatic (Fully active, able to carry on all predisease activities without restriction)  1 - Symptomatic but completely ambulatory (Restricted in physically strenuous activity but ambulatory and able to carry out work of a light or sedentary nature. For example, light housework, office work)  2 - Symptomatic, <50% in bed during the day (Ambulatory and capable of all self care but unable to carry out any work activities. Up and about more than 50% of waking hours)  3 - Symptomatic, >50% in bed, but not bedbound (Capable of only limited self-care, confined to bed or chair 50% or more of waking hours)  4 - Bedbound (Completely disabled. Cannot carry on any self-care. Totally confined  to bed or chair)  5 - Death   Eustace Pen MM, Creech RH, Tormey DC, et al. [id number]). "Toxicity and response criteria of the [facility]". [place] Oncol. 5 (6): 649-55   LABORATORY DATA:  Lab Results  Component Value Date   WBC 8.3 06/27/2020   HGB 13.9 06/27/2020   HCT 41.6 06/27/2020   MCV 90.4 06/27/2020   PLT 245 06/27/2020   CMP     Component Value Date/Time   NA 142 06/27/2020 1210   NA 141 01/09/2020 0000   K 3.9 06/27/2020 1210   CL 102 06/27/2020 1210   CO2 29 06/27/2020 1210   GLUCOSE 93 06/27/2020 1210   BUN 18 06/27/2020 1210   BUN 13 06/05/2020 0000   CREATININE 0.88 06/27/2020 1210   CREATININE 0.86 02/06/2011 0937   CALCIUM 9.5 06/27/2020 1210   PROT 7.6 06/27/2020 1210   ALBUMIN 4.3 06/27/2020 1210   AST 49 (H)  06/27/2020 1210   ALT 69 (H) 06/27/2020 1210   ALKPHOS 113 06/27/2020 1210   BILITOT 0.8 06/27/2020 1210   GFRNONAA >60 06/27/2020 1210   GFRNONAA >60 02/06/2011 0937   GFRAA 95 01/09/2020 0000   GFRAA >60 02/06/2011 0937         RADIOGRAPHY: US BREAST LTD UNI RIGHT INC AXILLA  Result Date: 06/08/2020 CLINICAL DATA:  Possible mass in the 12 o'clock position of the right breast anteriorly on a recent screening mammogram. Calcifications and possible distortion in the 12 o'clock position of the mid left breast on the screening mammogram. EXAM: DIGITAL DIAGNOSTIC BILATERAL MAMMOGRAM WITH CAD AND TOMOSYNTHESIS ULTRASOUND RIGHT BREAST TECHNIQUE: Bilateral digital diagnostic mammography and breast tomosynthesis was performed. Digital images of the breasts were evaluated with computer-aided detection. Targeted ultrasound examination of the Right breast was performed. COMPARISON:  Previous exam(s). ACR Breast Density Category b: There are scattered areas of fibroglandular density. FINDINGS: 3D tomographic and 2D generated spot compression images of the right breast demonstrate a less prominent irregular mass-like density in the 12 o'clock position of the breast anteriorly. 3D tomographic and 2D generated spot compression and 2D spot magnification images of the left breast demonstrate normal appearing fibroglandular tissue and supporting ligaments at the location of recently suspected distortion in the 12 o'clock position of the breast. There are 2 small groups of calcifications in the left breast, 1 in the central portion of the breast in the 12 o'clock position, spanning 4 mm. The other is in the anterior aspect of the 1 o'clock position of the breast, spanning 4 mm. The calcifications in both groups are small and oval in shape, varying slightly in size and density. Together, the groups span an area measuring 3.9 cm. On physical exam, no mass is palpable in the 12 o'clock position of the right breast or right  axilla. Targeted ultrasound is performed, showing a 9 x 7 x 6 mm irregular, hypoechoic mass with adjacent ill-defined increased echogenicity in the 12 o'clock position of the right breast, 2 cm from the nipple. This corresponds to the mammographic mass age. Ultrasound of the right axilla demonstrated normal appearing right axillary lymph nodes. IMPRESSION: 1. 9 mm mass in the 12 o'clock position of the right breast with imaging features highly suspicious for malignancy. 2. 4 mm group of indeterminate calcifications in the 12 o'clock position of the left breast and 4 mm group of indeterminate calcifications in the 1 o'clock position of the left breast. RECOMMENDATION: 1. Ultrasound-guided core needle biopsy of the 9  mm mass in the 12 o'clock position of the right breast. 2. 3D stereotactic guided core needle biopsy of the 4 mm group of calcifications in the 12 o'clock position of the left breast. 3. 3D stereotactic guided core needle biopsy of the 4 mm group of calcifications in the 1 o'clock position of the left breast. This has been discussed the patient and the biopsies have been scheduled at 7:30 a.m. on 06/21/2020. I have discussed the findings and recommendations with the patient. If applicable, a reminder letter will be sent to the patient regarding the next appointment. BI-RADS CATEGORY  5: Highly suggestive of malignancy. Electronically Signed   By: Beckie Salts M.D.   On: 06/08/2020 12:19   MM DIAG BREAST TOMO BILATERAL  Result Date: 06/08/2020 CLINICAL DATA:  Possible mass in the 12 o'clock position of the right breast anteriorly on a recent screening mammogram. Calcifications and possible distortion in the 12 o'clock position of the mid left breast on the screening mammogram. EXAM: DIGITAL DIAGNOSTIC BILATERAL MAMMOGRAM WITH CAD AND TOMOSYNTHESIS ULTRASOUND RIGHT BREAST TECHNIQUE: Bilateral digital diagnostic mammography and breast tomosynthesis was performed. Digital images of the breasts were  evaluated with computer-aided detection. Targeted ultrasound examination of the Right breast was performed. COMPARISON:  Previous exam(s). ACR Breast Density Category b: There are scattered areas of fibroglandular density. FINDINGS: 3D tomographic and 2D generated spot compression images of the right breast demonstrate a less prominent irregular mass-like density in the 12 o'clock position of the breast anteriorly. 3D tomographic and 2D generated spot compression and 2D spot magnification images of the left breast demonstrate normal appearing fibroglandular tissue and supporting ligaments at the location of recently suspected distortion in the 12 o'clock position of the breast. There are 2 small groups of calcifications in the left breast, 1 in the central portion of the breast in the 12 o'clock position, spanning 4 mm. The other is in the anterior aspect of the 1 o'clock position of the breast, spanning 4 mm. The calcifications in both groups are small and oval in shape, varying slightly in size and density. Together, the groups span an area measuring 3.9 cm. On physical exam, no mass is palpable in the 12 o'clock position of the right breast or right axilla. Targeted ultrasound is performed, showing a 9 x 7 x 6 mm irregular, hypoechoic mass with adjacent ill-defined increased echogenicity in the 12 o'clock position of the right breast, 2 cm from the nipple. This corresponds to the mammographic mass age. Ultrasound of the right axilla demonstrated normal appearing right axillary lymph nodes. IMPRESSION: 1. 9 mm mass in the 12 o'clock position of the right breast with imaging features highly suspicious for malignancy. 2. 4 mm group of indeterminate calcifications in the 12 o'clock position of the left breast and 4 mm group of indeterminate calcifications in the 1 o'clock position of the left breast. RECOMMENDATION: 1. Ultrasound-guided core needle biopsy of the 9 mm mass in the 12 o'clock position of the right  breast. 2. 3D stereotactic guided core needle biopsy of the 4 mm group of calcifications in the 12 o'clock position of the left breast. 3. 3D stereotactic guided core needle biopsy of the 4 mm group of calcifications in the 1 o'clock position of the left breast. This has been discussed the patient and the biopsies have been scheduled at 7:30 a.m. on 06/21/2020. I have discussed the findings and recommendations with the patient. If applicable, a reminder letter will be sent to the patient regarding the next  appointment. BI-RADS CATEGORY  5: Highly suggestive of malignancy. Electronically Signed   By: Claudie Revering M.D.   On: 06/08/2020 12:19   MM CLIP PLACEMENT LEFT  Result Date: 06/21/2020 CLINICAL DATA:  Post ultrasound-guided biopsy of a mass in the right breast at the 12 o'clock position and stereotactic guided biopsy of left breast calcifications at the 12 o'clock and 1 o'clock positions. EXAM: DIAGNOSTIC BILATERAL MAMMOGRAM POST ULTRASOUND AND STEREOTACTIC BIOPSIES COMPARISON:  Previous exams. FINDINGS: Mammographic images were obtained following ultrasound-guided biopsy of a mass in the right breast at the 12 o'clock position and stereotactic guided biopsy of left breast calcifications at the 12 o'clock and 1 o'clock positions. A ribbon shaped biopsy marking clip is present at the site of the biopsied mass in the right breast the 12 o'clock position. There is an approximate 3.5 cm post biopsy hematoma lateral to the site of biopsy. A coil shaped biopsy marking clip is present in felt to be displaced approximately 1.5 cm inferior biopsied calcifications in the left breast at the 12 o'clock position. An X shaped biopsy marking clip is present and felt to be displaced approximately 0.8 cm inferior to the biopsied calcifications in the left breast. IMPRESSION: 1. Ribbon shaped biopsy marking clip at site of biopsied mass in the right breast at the 12 o'clock position. 2. Coil shaped biopsy marking clip felt to  be displaced approximately 1.5 cm inferior to the biopsied calcifications in the left breast at the 12 o'clock position. 3. X shaped biopsy marking clip felt to be displaced approximately 0.8 cm inferior to the biopsied calcifications in the left breast. Final Assessment: Post Procedure Mammograms for Marker Placement Electronically Signed   By: Everlean Alstrom M.D.   On: 06/21/2020 09:14   MM CLIP PLACEMENT RIGHT  Result Date: 06/21/2020 CLINICAL DATA:  Post ultrasound-guided biopsy of a mass in the right breast at the 12 o'clock position and stereotactic guided biopsy of left breast calcifications at the 12 o'clock and 1 o'clock positions. EXAM: DIAGNOSTIC BILATERAL MAMMOGRAM POST ULTRASOUND AND STEREOTACTIC BIOPSIES COMPARISON:  Previous exams. FINDINGS: Mammographic images were obtained following ultrasound-guided biopsy of a mass in the right breast at the 12 o'clock position and stereotactic guided biopsy of left breast calcifications at the 12 o'clock and 1 o'clock positions. A ribbon shaped biopsy marking clip is present at the site of the biopsied mass in the right breast the 12 o'clock position. There is an approximate 3.5 cm post biopsy hematoma lateral to the site of biopsy. A coil shaped biopsy marking clip is present in felt to be displaced approximately 1.5 cm inferior biopsied calcifications in the left breast at the 12 o'clock position. An X shaped biopsy marking clip is present and felt to be displaced approximately 0.8 cm inferior to the biopsied calcifications in the left breast. IMPRESSION: 1. Ribbon shaped biopsy marking clip at site of biopsied mass in the right breast at the 12 o'clock position. 2. Coil shaped biopsy marking clip felt to be displaced approximately 1.5 cm inferior to the biopsied calcifications in the left breast at the 12 o'clock position. 3. X shaped biopsy marking clip felt to be displaced approximately 0.8 cm inferior to the biopsied calcifications in the left breast.  Final Assessment: Post Procedure Mammograms for Marker Placement Electronically Signed   By: Everlean Alstrom M.D.   On: 06/21/2020 09:14   MM LT BREAST BX W LOC DEV 1ST LESION IMAGE BX SPEC STEREO GUIDE  Addendum Date: 06/25/2020   ADDENDUM  REPORT: 06/25/2020 08:02 ADDENDUM: Pathology revealed GRADE II INVASIVE MAMMARY CARCINOMA, MAMMARY CARCINOMA IN SITU of the RIGHT breast, 12 o'clock. E-cadherin is POSITIVE supporting a ductal origin. This was found to be concordant by Dr. Everlean Alstrom. 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 E-cadherin is POSITIVE supporting a ductal origin. This was found to be concordant by Dr. Everlean Alstrom. 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 E-cadherin is POSITIVE supporting a ductal origin. This was found to be concordant by Dr. Everlean Alstrom. Pathology results were discussed with the patient by telephone. The patient reported doing well after the biopsies with tenderness at the sites. Post biopsy instructions and care were reviewed and questions were answered. The patient was encouraged to call [facility] for any additional concerns. The patient was referred to [facility] on June 27, 2020. Consider bilateral breast MRI. Pathology results reported by Stacie Acres RN on 06/25/2020. Electronically Signed   By: Franki Cabot M.D.   On: 06/25/2020 08:02   Result Date: 06/25/2020 CLINICAL DATA:  65 year old female with an indeterminate 0.4 cm group of calcifications in the left breast at the 12 o'clock position and an indeterminate 0.4 cm group of calcifications in the left breast at the 1 o'clock position. EXAM: LEFT BREAST STEREOTACTIC CORE NEEDLE BIOPSY COMPARISON:  Previous exams. FINDINGS: The patient and I discussed the  procedure of stereotactic-guided biopsy including benefits and alternatives. We discussed the high likelihood of a successful procedure. We discussed the risks of the procedure including infection, bleeding, tissue injury, clip migration, and inadequate sampling. Informed written consent was given. The usual time out protocol was performed immediately prior to the procedure. SITE 1: LEFT BREAST 12 O'CLOCK: CALCIFICATIONS: Using sterile technique and 1% Lidocaine as local anesthetic, under stereotactic guidance, a 9 gauge vacuum assisted device was used to perform core needle biopsy of the calcifications in the left breast at the 12 o'clock position using a superior to inferior approach. Specimen radiograph was performed showing the presence of calcifications. Specimens with calcifications are identified for pathology. Lesion quadrant: Upper-outer At the conclusion of the procedure, a coil shaped tissue marker clip was deployed into the biopsy cavity. Follow-up 2-view mammogram was performed and dictated separately. SITE 2: LEFT BREAST 1 O'CLOCK: CALCIFICATIONS: Using sterile technique and 1% Lidocaine as local anesthetic, under stereotactic guidance, a 9 gauge vacuum assisted device was used to perform core needle biopsy of the calcifications in the left breast at the 1 o'clock position using a superior to inferior approach. Specimen radiograph was performed showing the presence of calcifications. Specimens with calcifications are identified for pathology. Lesion quadrant: Upper-outer At the conclusion of the procedure, an X shaped tissue marker clip was deployed into the biopsy cavity. Follow-up 2-view mammogram was performed and dictated separately. IMPRESSION: 1. Stereotactic guided biopsy of calcifications in the left breast at the 12 o'clock position, at site of coil shaped biopsy marking clip. 2. Stereotactic guided biopsy of calcifications in the left breast at the 1 o'clock position, at site of X shaped  biopsy marking clip. Electronically Signed: By: Everlean Alstrom M.D. On: 06/21/2020 09:05   MM LT BREAST BX W LOC DEV EA AD LESION IMG BX SPEC STEREO GUIDE  Addendum Date: 06/25/2020   ADDENDUM REPORT: 06/25/2020 08:02 ADDENDUM: Pathology revealed GRADE II INVASIVE MAMMARY CARCINOMA, MAMMARY CARCINOMA IN SITU  of the RIGHT breast, 12 o'clock. E-cadherin is POSITIVE supporting a ductal origin. This was found to be concordant by Dr. Everlean Alstrom. 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 E-cadherin is POSITIVE supporting a ductal origin. This was found to be concordant by Dr. Everlean Alstrom. 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 E-cadherin is POSITIVE supporting a ductal origin. This was found to be concordant by Dr. Everlean Alstrom. Pathology results were discussed with the patient by telephone. The patient reported doing well after the biopsies with tenderness at the sites. Post biopsy instructions and care were reviewed and questions were answered. The patient was encouraged to call [facility] for any additional concerns. The patient was referred to [facility] on June 27, 2020. Consider bilateral breast MRI. Pathology results reported by Stacie Acres RN on 06/25/2020. Electronically Signed   By: Franki Cabot M.D.   On: 06/25/2020 08:02   Result Date: 06/25/2020 CLINICAL DATA:  65 year old female with an indeterminate 0.4 cm group of calcifications in the left breast at the 12 o'clock position and an indeterminate 0.4 cm group of calcifications in the left breast at the 1 o'clock position. EXAM: LEFT BREAST STEREOTACTIC CORE NEEDLE BIOPSY COMPARISON:  Previous exams. FINDINGS: The patient and I discussed the procedure of stereotactic-guided biopsy including  benefits and alternatives. We discussed the high likelihood of a successful procedure. We discussed the risks of the procedure including infection, bleeding, tissue injury, clip migration, and inadequate sampling. Informed written consent was given. The usual time out protocol was performed immediately prior to the procedure. SITE 1: LEFT BREAST 12 O'CLOCK: CALCIFICATIONS: Using sterile technique and 1% Lidocaine as local anesthetic, under stereotactic guidance, a 9 gauge vacuum assisted device was used to perform core needle biopsy of the calcifications in the left breast at the 12 o'clock position using a superior to inferior approach. Specimen radiograph was performed showing the presence of calcifications. Specimens with calcifications are identified for pathology. Lesion quadrant: Upper-outer At the conclusion of the procedure, a coil shaped tissue marker clip was deployed into the biopsy cavity. Follow-up 2-view mammogram was performed and dictated separately. SITE 2: LEFT BREAST 1 O'CLOCK: CALCIFICATIONS: Using sterile technique and 1% Lidocaine as local anesthetic, under stereotactic guidance, a 9 gauge vacuum assisted device was used to perform core needle biopsy of the calcifications in the left breast at the 1 o'clock position using a superior to inferior approach. Specimen radiograph was performed showing the presence of calcifications. Specimens with calcifications are identified for pathology. Lesion quadrant: Upper-outer At the conclusion of the procedure, an X shaped tissue marker clip was deployed into the biopsy cavity. Follow-up 2-view mammogram was performed and dictated separately. IMPRESSION: 1. Stereotactic guided biopsy of calcifications in the left breast at the 12 o'clock position, at site of coil shaped biopsy marking clip. 2. Stereotactic guided biopsy of calcifications in the left breast at the 1 o'clock position, at site of X shaped biopsy marking clip. Electronically Signed: By:  Everlean Alstrom M.D. 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 E-cadherin is POSITIVE supporting a ductal origin. This was found  to be concordant by Dr. Everlean Alstrom. 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 E-cadherin is POSITIVE supporting a ductal origin. This was found to be concordant by Dr. Everlean Alstrom. 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 E-cadherin is POSITIVE supporting a ductal origin. This was found to be concordant by Dr. Everlean Alstrom. Pathology results were discussed with the patient by telephone. The patient reported doing well after the biopsies with tenderness at the sites. Post biopsy instructions and care were reviewed and questions were answered. The patient was encouraged to call [facility] for any additional concerns. The patient was referred to [facility] on June 27, 2020. Consider bilateral breast MRI. Pathology results reported by Stacie Acres RN on 06/25/2020. Electronically Signed   By: Franki Cabot M.D.   On: 06/25/2020 08:03   Result Date: 06/25/2020 CLINICAL DATA:  65 year old female with a suspicious 0.9 cm mass in the right breast at the 12 o'clock position. EXAM: ULTRASOUND GUIDED RIGHT BREAST CORE NEEDLE BIOPSY COMPARISON:  Previous exam(s). PROCEDURE: I met with the patient and we discussed the procedure of ultrasound-guided biopsy, including benefits and alternatives. We discussed the high likelihood of a successful procedure. We discussed the risks of the procedure, including infection, bleeding,  tissue injury, clip migration, and inadequate sampling. Informed written consent was given. The usual time-out protocol was performed immediately prior to the procedure. Lesion quadrant: Upper inner Using sterile technique and 1% Lidocaine as local anesthetic, under direct ultrasound visualization, a 14 gauge spring-loaded device was used to perform biopsy of the mass in the right breast at the 12 o'clock position using a lateral to medial approach. At the conclusion of the procedure a ribbon shaped tissue marker clip was deployed into the biopsy cavity. Follow up 2 view mammogram was performed and dictated separately. IMPRESSION: Ultrasound guided biopsy of the mass in the right breast at the 12 o'clock position. No apparent complications. Electronically Signed: By: Everlean Alstrom M.D. On: 06/21/2020 08:06      IMPRESSION/PLAN: Bilateral breast cancer  She has been discussed at our multidisciplinary tumor board.  The consensus is that she may be a good candidate for breast conservation, pending results to a breast MRI. She is enthusiastic about breast conservation if possible.  It was a pleasure meeting the patient today.  She understands that if she undergoes breast conserving surgery, we will recommend adjuvant radiation therapy.  We discussed the risks, benefits, and side effects of radiotherapy. Radiation therapy would likely take place over about 1 month and be delivered to both breasts to reduce her risk of locoregional recurrence by 2/3.   I would give the patient a few weeks to heal following surgery before starting treatment planning.  If chemotherapy were to be given, this would precede radiotherapy. We spoke about acute effects including skin irritation and fatigue as well as much less common late effects including internal organ injury or irritation. We spoke about the latest technology that is used to minimize the risk of late effects for patients undergoing radiotherapy to the breast or chest  wall. No guarantees of treatment were given. The patient is enthusiastic about proceeding with treatment. I look forward to participating in the patient's care.  I will await her referral back to me for postoperative follow-up and eventual CT simulation/treatment planning.  On date of  service, in total, I spent 30 minutes on this encounter. Patient was seen in person.   __________________________________________   Eppie Gibson, MD  This document serves as a record of services personally performed by Eppie Gibson, MD. It was created on his behalf by Clerance Lav, a trained medical scribe. The creation of this record is based on the scribe's personal observations and the provider's statements to them. This document has been checked and approved by the attending provider.

## 2020-06-27 NOTE — Therapy (Signed)
[place], [place], [address] Phone: [phone]   Fax:  [phone]  Physical Therapy Evaluation  Patient Details  Name: [name] MRN: [id number] Date of Birth: 28-Mar-1956 Referring Provider (PT): Dr. Autumn Messing   Encounter Date: 06/27/2020   PT End of Session - 06/27/20 1314    Visit Number 1    Number of Visits 2    Date for PT Re-Evaluation 08/22/20    PT Start Time 1550    PT Stop Time 1620    PT Time Calculation (min) 30 min    Activity Tolerance Patient tolerated treatment well    Behavior During Therapy [facility] for tasks assessed/performed           Past Medical History:  Diagnosis Date  . Anal fissure   . Atrial septal aneurysm / if pfo  echo 6 13  10/23/2011  . Breast cancer ([place])   . Cataract    both eyes  . Depression   . Fatty liver    "pre fatty liver"  . Fibromyalgia   . Headache(784.0)    hx of migraines when younger  . Heart palpitations    hx with normal holter event monitoring  . Hx: UTI (urinary tract infection)   . Hyperlipidemia   . Hypertension   . Hypothyroidism   . Obesity   . Pneumonia 1972   hx of  . Polyp of colon   . 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 Skin cancer    basal, squamous cell    Past Surgical History:  Procedure Laterality Date  . COLONOSCOPY    . DILATION AND CURETTAGE OF UTERUS  1978  . [place]  . KNEE ARTHROSCOPY     both in past  . POLYPECTOMY    . SHOULDER OPEN ROTATOR CUFF REPAIR Left 07/07/2013   Procedure: LEFT SHOULDER MINI OPEN SUBACROMIAL DECOMPRESSION ROTATOR CUFF REPAIR AND POSSIBLE PATCH GRAFT ;  Surgeon: Johnn Hai, MD;  Location: [facility];  Service: Orthopedics;  Laterality: Left;  with interscaline block  . SKIN CANCER EXCISION Bilateral    arm, legs, and chest  . TONSILLECTOMY      There were no vitals filed for this visit.    Subjective Assessment - 06/27/20 1304    Subjective  Patient reports she is here today to be seen by her medical team for her newly diagnosed bilateral breast cancer.    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 The left breast has 2 areas of calcifications measuring 4 mm each that are 4 cm apart and ER/PR positive. The right breast has a 9 mm mass in the upper outer quadrant which is ER/PR positive and HER2 negative with a Ki67 of 10%.    Patient Stated Goals reduce lymphedema risk and learn post op shoulder ROM HEP    Currently in Pain? 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 Autumn Messing    Onset Date/Surgical Date 05/22/20    Hand Dominance Right    Prior Therapy Baselines      Precautions   Precautions Other (comment)    Precaution Comments active cancer      Restrictions  Weight Bearing Restrictions No      Balance Screen   Has the patient fallen in the past 6 months No    Has the patient had a decrease in activity level because of a fear of falling?  No    Is the patient reluctant to leave their home because of a fear of falling?  No      Home Ecologist residence    Living Arrangements Spouse/significant other    Available Help at Discharge Family      Prior Function   Level of [place] Retired    Leisure She does not exercise      Cognition   Overall Cognitive Status Within Functional Limits for tasks assessed      Posture/Postural Control   Posture/Postural Control Postural limitations    Postural Limitations Rounded Shoulders;Forward head      ROM / Strength   AROM / PROM / Strength AROM;Strength      AROM   AROM Assessment Site Shoulder    Right/Left Shoulder Right;Left    Right Shoulder Extension 60 Degrees    Right Shoulder  Flexion 164 Degrees    Right Shoulder ABduction 161 Degrees    Right Shoulder Internal Rotation 68 Degrees    Right Shoulder External Rotation 66 Degrees    Left Shoulder Extension 66 Degrees    Left Shoulder Flexion 157 Degrees    Left Shoulder ABduction 158 Degrees    Left Shoulder Internal Rotation 57 Degrees    Left Shoulder External Rotation 68 Degrees      Strength   Overall Strength Within functional limits for tasks performed             LYMPHEDEMA/ONCOLOGY QUESTIONNAIRE - 06/27/20 0001      Type   Cancer Type Bilateral breast cancer      Lymphedema Assessments   Lymphedema Assessments Upper extremities      Right Upper Extremity Lymphedema   10 cm Proximal to Olecranon Process 32.5 cm    Olecranon Process 26.6 cm    10 cm Proximal to Ulnar Styloid Process 25.8 cm    Just Proximal to Ulnar Styloid Process 17.5 cm    Across Hand at [employer] 19.3 cm    At [place] of 2nd Digit 6.3 cm      Left Upper Extremity Lymphedema   10 cm Proximal to Olecranon Process 32.2 cm    Olecranon Process 27.1 cm    10 cm Proximal to Ulnar Styloid Process 24.7 cm    Just Proximal to Ulnar Styloid Process 17.3 cm    Across Hand at [employer] 18.8 cm    At [place] of 2nd Digit 5.9 cm           L-DEX FLOWSHEETS - 06/27/20 1300      L-DEX LYMPHEDEMA SCREENING   Measurement Type Unilateral   DCIS on left with no nodes removed so only Rt at risk   L-DEX MEASUREMENT EXTREMITY Upper Extremity    POSITION  Standing    DOMINANT SIDE Right    At Risk Side Right    BASELINE SCORE (UNILATERAL) 3.5           The patient was assessed using the L-Dex machine today to produce a lymphedema index baseline score. The patient will be reassessed on a regular basis (typically every 3 months) to obtain new L-Dex scores. If the score is > 6.5 points away from  his/her baseline score indicating onset of subclinical lymphedema, it will be recommended to wear a compression garment for 4 weeks,  12 hours per day and then be reassessed. If the score continues to be > 6.5 points from baseline at reassessment, we will initiate lymphedema treatment. Assessing in this manner has a 95% rate of preventing clinically significant lymphedema.      Katina Dung - 06/27/20 0001    Open a tight or new jar Mild difficulty    Do heavy household chores (wash walls, wash floors) Mild difficulty    Carry a shopping bag or briefcase No difficulty    Wash your back No difficulty    Use a knife to cut food No difficulty    Recreational activities in which you take some force or impact through your arm, shoulder, or hand (golf, hammering, tennis) Moderate difficulty    During the past week, to what extent has your arm, shoulder or hand problem interfered with your normal social activities with family, friends, neighbors, or groups? Modererately    During the past week, to what extent has your arm, shoulder or hand problem limited your work or other regular daily activities Modererately    Arm, shoulder, or hand pain. Mild    Tingling (pins and needles) in your arm, shoulder, or hand None    Difficulty Sleeping No difficulty    DASH Score 20.45 %            Objective measurements completed on examination: See above findings.          Patient was instructed today in a home exercise program today for post op shoulder range of motion. These included active assist shoulder flexion in sitting, scapular retraction, wall walking with shoulder abduction, and hands behind head external rotation.  She was encouraged to do these twice a day, holding 3 seconds and repeating 5 times when permitted by her physician.         PT Education - 06/27/20 1314    Education Details Lymphedema risk reduction and post op shoulder ROM HEP    Person(s) Educated Patient    Methods Explanation;Demonstration;Handout    Comprehension Returned demonstration;Verbalized understanding               PT Long Term  Goals - 06/27/20 1333      PT LONG TERM GOAL #1   Title Patient will demonstrate she has regained full shoulder ROM and function post operatively compared with baselines.    Time 8    Period Weeks    Status New    Target Date 08/22/20           Breast Clinic Goals - 06/27/20 1333      Patient will be able to verbalize understanding of pertinent lymphedema risk reduction practices relevant to her diagnosis specifically related to skin care.   Time 1    Period Days    Status Achieved      Patient will be able to return demonstrate and/or verbalize understanding of the post-op home exercise program related to regaining shoulder range of motion.   Time 1    Period Days    Status Achieved      Patient will be able to verbalize understanding of the importance of attending the postoperative After Breast Cancer Class for further lymphedema risk reduction education and therapeutic exercise.   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 The left breast has 2 areas of calcifications measuring 4 mm each that are 4 cm apart and ER/PR positive. The right breast has a 9 mm mass in the upper outer quadrant which is ER/PR positive and HER2 negative with a Ki67 of 10%. Her multidisciplinary medical team met prior to her assessments to determine a recommended treatment plan. She is planning to have a bilateral lumpectomy with a right sentinel node biopsy followed by Oncotype testing, radiation, and anti-estrogen therapy. She will benefit from a post op PT reassessment to determine needs and from L-Dex screens every 3 months for 2 years to detect subclinical lymphedema.    Stability/Clinical Decision Making Stable/Uncomplicated    Clinical Decision Making Low    Rehab Potential Excellent    PT Frequency --   Eval and 1 f/u visit    PT Treatment/Interventions ADLs/Self Care Home Management;Therapeutic exercise;Patient/family education    PT Next Visit Plan Will reassess 3-4 weeks post op to determine needs    PT Home Exercise Plan Post op shoulder ROM HEP    Consulted and Agree with Plan of Care Patient           Patient will benefit from skilled therapeutic intervention in order to improve the following deficits and impairments:  Decreased range of motion,Decreased knowledge of precautions,Pain,Impaired UE functional use  Visit Diagnosis: Malignant neoplasm of upper-outer quadrant of right breast in female, estrogen receptor positive ([place]) - Plan: PT plan of care cert/re-cert  Ductal carcinoma in situ (DCIS) of left breast - Plan: PT plan of care cert/re-cert  Abnormal posture - Plan: PT plan of care cert/re-cert   Patient will follow up at outpatient cancer rehab 3-4 weeks following surgery.  If the patient requires physical therapy at that time, a specific plan will be dictated and sent to the referring physician for approval. The patient was educated today on appropriate basic range of motion exercises to begin post operatively and the importance of attending the After Breast Cancer class following surgery.  Patient was educated today on lymphedema risk reduction practices as it pertains to recommendations that will benefit the patient immediately following surgery.  She verbalized good understanding.      Problem List Patient Active Problem List   Diagnosis Date Noted  . Malignant neoplasm of upper-outer quadrant of right breast in female, estrogen receptor positive ([place]) 06/25/2020  . 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 Hepatic steatosis 11/27/2014  . Hx of adenomatous colonic polyps 11/27/2014  . Visit for preventive health examination 03/28/2014  . Hyperlipidemia 03/28/2014  . Left rotator cuff tear 07/07/2013  . Tear of left rotator cuff 07/07/2013  . Rhinitis 06/01/2013  .  Chest wall deformity 04/12/2012  . Atrial septal aneurysm / if pfo  echo 6 13  10/23/2011  . OSA (obstructive sleep apnea) 11/27/2010  . Dyslipidemia 11/01/2010  . Flushing 08/29/2010  . Labile hypertension 08/29/2010  . MUSCLE CRAMPS, FOOT 06/10/2010  . OTHER SLEEP DISTURBANCES 06/10/2010  . ANXIETY, SITUATIONAL 05/08/2010  . VERTIGO, POSITIONAL 03/15/2010  . VITAMIN D DEFICIENCY 02/18/2010  . OBESITY 02/18/2010  . ALLERGIC RHINITIS 02/18/2010  . PLANTAR FASCIITIS 02/18/2010  . TWITCHING 06/11/2009  . NUMBNESS, HAND 06/11/2009  . CERVICAL STRAIN, ACUTE 06/11/2009  . OTHER MALAISE AND FATIGUE 07/09/2007  . HYPERTENSION 01/28/2007  . Hypothyroidism 11/11/2006  .  COMMON MIGRAINE 11/11/2006  . GERD 11/11/2006  . FIBROMYALGIA 11/11/2006   Annia Friendly, PT 06/27/20 5:10 PM  [place], [place], [address] Phone: [phone]   Fax:  [phone]  Name: [name] MRN: [id number] Date of Birth: 23-Sep-1955

## 2020-06-27 NOTE — Progress Notes (Signed)
[place]   [phone] Fax:([phone] [place] Note   Patient Care Team: Panosh, Standley Brooking, MD as PCP - General Fay Records, MD as PCP - Cardiology (Cardiology) Clance, Armando Reichert, MD (Pulmonary Disease) Arvella Nigh, MD as Consulting Physician (Obstetrics and Gynecology) Dr Peggye Form, MD as Consulting Physician (Orthopedic Surgery) Ladene Artist, MD as Consulting Physician (Gastroenterology) Melina Schools, MD as Consulting Physician (Orthopedic Surgery) Rockwell Germany, RN as Oncology Nurse Navigator Mauro Kaufmann, RN as Oncology Nurse Navigator Jovita Kussmaul, MD as Consulting Physician (General Surgery) Truitt Merle, MD as Consulting Physician (Hematology) Eppie Gibson, MD as Attending Physician (Radiation Oncology)  Date of Service:  06/27/2020   CHIEF COMPLAINTS/PURPOSE OF CONSULTATION:  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    Oncology History Overview Note  Cancer Staging Ductal carcinoma in situ (DCIS) of left breast Staging form: Breast, AJCC 8th Edition - Clinical stage from 06/21/2020: Stage 0 (cTis (DCIS), cN0, cM0, G2, ER+, PR+, HER2: Not Assessed) - Signed by Truitt Merle, MD on 06/26/2020 Stage prefix: Initial diagnosis  Malignant neoplasm of upper-outer quadrant of right breast in female, estrogen receptor positive ([place]) Staging form: Breast, AJCC 8th Edition - Clinical stage from 06/21/2020: Stage IA (cT1b, cN0, cM0, G2, ER+, PR+, HER2-) - Signed by Truitt Merle, MD on 06/26/2020 Stage prefix: Initial diagnosis    Malignant neoplasm of upper-outer quadrant of right breast in female, estrogen receptor positive ([place])  06/08/2020 Mammogram   IMPRESSION: 1. 9 x 7 x 6 mm mass in the 12 o'clock position of the right breast, 2cmfn with imaging features highly suspicious for malignancy. 2. 4 mm group of indeterminate calcifications in the 12 o'clock position of the left breast and 4 mm group of  indeterminate calcifications in the 1 o'clock position of the left breast. Together, the groups span an area measuring 3.9 cm.   06/21/2020 Cancer Staging   Staging form: Breast, AJCC 8th Edition - Clinical stage from 06/21/2020: Stage IA (cT1b, cN0, cM0, G2, ER+, PR+, HER2-) - Signed by Truitt Merle, MD on 06/26/2020 Stage prefix: Initial diagnosis   06/21/2020 Initial Biopsy   Diagnosis 1. Breast, right, needle core biopsy, 12 oc - INVASIVE MAMMARY CARCINOMA - MAMMARY CARCINOMA IN SITU - SEE COMMENT 2. Breast, left, needle core biopsy, 12 oc - MAMMARY CARCINOMA IN-SITU WITH NECROSIS AND CALCIFICATIONS - SEE COMMENT 3. Breast, left, needle core biopsy, 1 oc - MAMMARY CARCINOMA IN-SITU WITH NECROSIS AND CALCIFICATIONS - SEE COMMENT Microscopic Comment 1. The biopsy material shows an infiltrative proliferation of cells with large vesicular nuclei with inconspicuous nucleoli, arranged linearly and in small clusters. Based on the biopsy, the carcinoma appears Nottingham grade 2 of 3 and measures 0.8 cm in greatest linear extent. E-cadherin and prognostic markers (ER/PR/ki-67/HER2)are pending and will be reported in an addendum. Dr. Jeannie Done reviewed the case and agrees with the above diagnosis. These results were called to [facility] on June 22, 2020. 2. and 3. E-cadherin is pending and will be reported in an addendum.     1. E-cadherin is POSITIVE supporting a ductal origin. 2. E-cadherin is POSITIVE supporting a ductal origin. A prognostic panel (ER/PR) is pending and will be reported in an addendum 3. E-cadherin is positive supporting a ductal origin. The focus is less pronounced on the deeper sections and in isolation would likely be considered atypical ductal hyperplasia.   06/21/2020 Receptors her2  1. PROGNOSTIC INDICATORS Results: IMMUNOHISTOCHEMICAL AND MORPHOMETRIC ANALYSIS PERFORMED MANUALLY The tumor cells are EQUIVOCAL for Her2 (2+). HER2 by FISH  will be performed and the results reported separately Estrogen Receptor: 95%, POSITIVE, STRONG STAINING INTENSITY Progesterone Receptor: 40%, POSITIVE, MODERATE STAINING INTENSITY Proliferation Marker Ki67: 10%  1. FLUORESCENCE IN-SITU HYBRIDIZATION Results: GROUP 5: HER2 **NEGATIVE** Equivocal form of amplification of the HER2 gene was detected in the IHC 2+ tissue sample received from this individual. HER2 FISH was performed by a technologist and cell imaging and analysis on the [employer].   06/25/2020 Initial Diagnosis   Malignant neoplasm of upper-outer quadrant of right breast in female, estrogen receptor positive ([place])   Ductal carcinoma in situ (DCIS) of left breast  06/08/2020 Mammogram   IMPRESSION: 1. 9 x 7 x 6 mm mass in the 12 o'clock position of the right breast, 2cmfn with imaging features highly suspicious for malignancy. 2. 4 mm group of indeterminate calcifications in the 12 o'clock position of the left breast and 4 mm group of indeterminate calcifications in the 1 o'clock position of the left breast. Together, the groups span an area measuring 3.9 cm.   06/21/2020 Cancer Staging   Staging form: Breast, AJCC 8th Edition - Clinical stage from 06/21/2020: Stage 0 (cTis (DCIS), cN0, cM0, G2, ER+, PR+, HER2: Not Assessed) - Signed by Truitt Merle, MD on 06/26/2020 Stage prefix: Initial diagnosis   06/21/2020 Initial Biopsy   Diagnosis 1. Breast, right, needle core biopsy, 12 oc - INVASIVE MAMMARY CARCINOMA - MAMMARY CARCINOMA IN SITU - SEE COMMENT 2. Breast, left, needle core biopsy, 12 oc - MAMMARY CARCINOMA IN-SITU WITH NECROSIS AND CALCIFICATIONS - SEE COMMENT 3. Breast, left, needle core biopsy, 1 oc - MAMMARY CARCINOMA IN-SITU WITH NECROSIS AND CALCIFICATIONS - SEE COMMENT Microscopic Comment 1. The biopsy material shows an infiltrative proliferation of cells with large vesicular nuclei with inconspicuous nucleoli, arranged linearly and in small clusters. Based on the  biopsy, the carcinoma appears Nottingham grade 2 of 3 and measures 0.8 cm in greatest linear extent. E-cadherin and prognostic markers (ER/PR/ki-67/HER2)are pending and will be reported in an addendum. Dr. Jeannie Done reviewed the case and agrees with the above diagnosis. These results were called to [facility] on June 22, 2020. 2. and 3. E-cadherin is pending and will be reported in an addendum.     1. E-cadherin is POSITIVE supporting a ductal origin. 2. E-cadherin is POSITIVE supporting a ductal origin. A prognostic panel (ER/PR) is pending and will be reported in an addendum 3. E-cadherin is positive supporting a ductal origin. The focus is less pronounced on the deeper sections and in isolation would likely be considered atypical ductal hyperplasia.   06/25/2020 Initial Diagnosis   Ductal carcinoma in situ (DCIS) of left breast    Receptors her2   2. PROGNOSTIC INDICATORS Results: IMMUNOHISTOCHEMICAL AND MORPHOMETRIC ANALYSIS PERFORMED MANUALLY Estrogen Receptor: 95%, POSITIVE, STRONG STAINING INTENSITY Progesterone Receptor: 30%, POSITIVE, STRONG STAINING INTENSITY      HISTORY OF PRESENTING ILLNESS:  [name] 65 y.o. 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 The patient presents to the Breast clinic today accompanied by her husband  She notes her mass was found by mammogram. She did not feel the mass herself. She notes she had pain with biopsy and significant skin bruising. She has residual soreness. She notes about 2 weeks ago she had GI virus with nausea and diarrhea and went to ED for symptom management.  That lasted after about 1-2 days.  Today she notes she is anxious and nervous about her cancer diagnosis. She had questions about what she can do for her cancer while she has active cancer.   Socially she is married with children. She rarely drinks alcohol. She does not smoke.  She has a PMHx of fatty  liver. Borderline HTN, Hypothyroidism, depression, H/o of squamous and basal skin cancer and sleep apnea. 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 She has prolapsed bladder which is pushing in to her vaginal. She is being seen for this. She notes her liver enzymes are very sensitive.  I reviewed her medication list with her. She is on several supplements. She notes allergy to penicillin and she has some other drug sensitivities. She had tonsillectomy, knee arthroscopic, shoulder repair and ectopic pregnancy.     GYN HISTORY  Menarchal: 10 LMP: 2010 Contraceptive: No HRT: progesterone and 2013-06/08/20 G6PA2: first at age 55 - 52 with miscarriage    REVIEW OF SYSTEMS:    Constitutional: Denies fevers, chills or abnormal night sweats Eyes: Denies blurriness of vision, double vision or watery eyes Ears, nose, mouth, throat, and face: Denies mucositis or sore throat Respiratory: Denies cough, dyspnea or wheezes Cardiovascular: Denies palpitation, chest discomfort or lower extremity swelling Gastrointestinal:  Denies nausea, heartburn or change in bowel habits Skin: Denies abnormal skin rashes Lymphatics: Denies new lymphadenopathy or easy bruising Neurological:Denies numbness, tingling or new weaknesses Behavioral/Psych: Mood is stable, no new changes  All other systems were reviewed with the patient and are negative.  MEDICAL HISTORY:  Past Medical History:  Diagnosis Date  . Anal fissure   . Atrial septal aneurysm / if pfo  echo 6 13  10/23/2011  . Breast cancer ([place])   . Cataract    both eyes  . Depression   . Fatty liver    "pre fatty liver"  . Fibromyalgia   . Headache(784.0)    hx of migraines when younger  . Heart palpitations    hx with normal holter event monitoring  . Hx: UTI (urinary tract infection)   . Hyperlipidemia   . Hypertension   . Hypothyroidism   . Obesity   . Pneumonia 1972   hx of  . Polyp of colon   . Serrated  adenoma of colon 08/2012  . Skin cancer    basal, squamous cell    SURGICAL HISTORY: Past Surgical History:  Procedure Laterality Date  . COLONOSCOPY    . DILATION AND CURETTAGE OF UTERUS  1978  . [place]  . KNEE ARTHROSCOPY     both in past  . POLYPECTOMY    . SHOULDER OPEN ROTATOR CUFF REPAIR Left 07/07/2013   Procedure: LEFT SHOULDER MINI OPEN SUBACROMIAL DECOMPRESSION ROTATOR CUFF REPAIR AND POSSIBLE PATCH GRAFT ;  Surgeon: Johnn Hai, MD;  Location: [facility];  Service: Orthopedics;  Laterality: Left;  with interscaline block  . SKIN CANCER EXCISION Bilateral    arm, legs, and chest  . TONSILLECTOMY      SOCIAL HISTORY: Social History   Socioeconomic History  . Marital status: Married    Spouse name: Not on file  . Number of children: 6  . Years of education: Not on file  . Highest education level: Not on file  Occupational History  . Occupation: TEACHER ASST    Employer: [employer]  Tobacco Use  . Smoking status: Never Smoker  .  Smokeless tobacco: Never Used  Vaping Use  . Vaping Use: Never used  Substance and Sexual Activity  . Alcohol use: Yes    Comment: rare  . Drug use: No  . Sexual activity: Not on file  Other Topics Concern  . Not on file  Social History Narrative   Teachers Aide [employer]  Not workking since last February . 15 left shoulder surgery    Married   Special educ   HH of 4   3 outside dogs, 8 goats, 40 chickens and quail. [place]   Job stresses   Ex husband passes away   Not working after injur at school  Shoulder neck                Social Determinants of Health   Financial Resource Strain: [place]   . Difficulty of Paying Living Expenses: Not hard at all  Food Insecurity: No Food Insecurity  . Worried About Charity fundraiser in the Last Year: Never true  . Ran Out of Food in the Last Year: Never true  Transportation Needs: No Transportation Needs  . Lack of  Transportation (Medical): No  . Lack of Transportation (Non-Medical): No  Physical Activity: Inactive  . Days of Exercise per Week: 0 days  . Minutes of Exercise per Session: 0 min  Stress: No Stress Concern Present  . Feeling of Stress : Not at all  Social Connections: Moderately Integrated  . Frequency of Communication with Friends and Family: More than three times a week  . Frequency of Social Gatherings with Friends and Family: More than three times a week  . Attends Religious Services: More than 4 times per year  . Active Member of Clubs or Organizations: No  . Attends Archivist Meetings: Never  . Marital Status: Married  Human resources officer Violence: Not At Risk  . Fear of Current or Ex-Partner: No  . Emotionally Abused: No  . Physically Abused: No  . Sexually Abused: No    FAMILY HISTORY: Family History  Problem Relation Age of Onset  . Hypertension Mother        low borderline  . Osteoporosis Mother   . Hypertension Father   . Liver disease Father        amyloid deceased  . Hyperlipidemia Father   . Melanoma Sister   . Cancer Sister        melanoma   . Juvenile Diabetes Daughter   . ADD / ADHD Child   . Hyperlipidemia Other        Maternal grandmother  . Colon cancer Neg Hx   . Stomach cancer Neg Hx   . Colon polyps Neg Hx   . Esophageal cancer Neg Hx   . Rectal cancer Neg Hx     ALLERGIES:  is allergic to penicillins, cefdinir, norvasc [amlodipine besylate], cetirizine, irbesartan, molds & smuts, and tylenol [acetaminophen].  MEDICATIONS:  Current Outpatient Medications  Medication Sig Dispense Refill  . Ascorbic Acid (VITAMIN C) 1000 MG tablet Take 1,000 mg by mouth 2 (two) times daily.    . hydrochlorothiazide (HYDRODIURIL) 25 MG tablet TAKE 1 TABLET BY MOUTH EVERY DAY 90 tablet 0  . hydroxychloroquine (PLAQUENIL) 200 MG tablet Take 200 mg by mouth 2 (two) times a week.    Marland Kitchen MAGNESIUM CARBONATE PO Take 1 tablet by mouth daily.    . melatonin 5  MG TABS Take 5 mg by mouth at bedtime.    Marland Kitchen  Multiple Vitamin (MULTIVITAMIN) tablet Take 1 tablet by mouth daily.    . NON FORMULARY 48.6 mg. Thyroid desiccated (procine) sr 2 capsules 5 days a week, 3 capsules 2 days per week    . OLIVE LEAF EXTRACT PO Take by mouth.    Marland Kitchen OVER THE COUNTER MEDICATION Essential Pro    . OVER THE COUNTER MEDICATION Take 2 capsules by mouth daily. iflamed    . potassium chloride SA (KLOR-CON) 20 MEQ tablet TAKE ONE TABLET BY MOUTH ONE TIME DAILY 90 tablet 0  . Probiotic Product (PROBIOTIC PO) Take by mouth.    . zinc gluconate 50 MG tablet Take 50 mg by mouth daily.    Marland Kitchen LORazepam (ATIVAN) 1 MG tablet Take 0.5-1 tablets (0.5-1 mg total) by mouth every 8 (eight) hours as needed for anxiety. anxiety 20 tablet 0  . metoprolol succinate (TOPROL XL) 25 MG 24 hr tablet TAKE ONCE A DAY AS DIRECTED 30 tablet 1  . Pseudoephedrine-guaiFENesin (GUAIFENESIN 600/PSE 120 PO) Take 1 tablet by mouth as needed.     No current facility-administered medications for this visit.    PHYSICAL EXAMINATION: ECOG PERFORMANCE STATUS: 0 - Asymptomatic  Vitals:   06/27/20 1240  BP: (!) 150/63  Pulse: 96  Resp: 20  Temp: (!) 97.4 F (36.3 C)  SpO2: 99%   Filed Weights   06/27/20 1240  Weight: 203 lb 6.4 oz (92.3 kg)    GENERAL:alert, no distress and comfortable SKIN: skin color, texture, turgor are normal, no rashes or significant lesions EYES: normal, Conjunctiva are pink and non-injected, sclera clear  NECK: supple, thyroid normal size, non-tender, without nodularity LYMPH:  no palpable lymphadenopathy in the cervical, axillary  LUNGS: clear to auscultation and percussion with normal breathing effort HEART: regular rate & rhythm and no murmurs and no lower extremity edema ABDOMEN:abdomen soft, non-tender and normal bowel sounds Musculoskeletal:no cyanosis of digits and no clubbing  NEURO: alert & oriented x 3 with fluent speech, no focal motor/sensory deficits BREAST:  (+)2.5x1.5cm lump at 10:00 position of right breast with skin ecchymosis. (+) 2.5x1.5cm lump at 1:00 position of left breast with extensive skin ecchymosis    LABORATORY DATA:  I have reviewed the data as listed CBC Latest Ref Rng & Units 06/27/2020 06/12/2018 01/14/2018  WBC 4.0 - 10.5 K/uL 8.3 9.1 7.5  Hemoglobin 12.0 - 15.0 g/dL 13.9 13.1 13.5  Hematocrit 36.0 - 46.0 % 41.6 41.1 39  Platelets 150 - 400 K/uL 245 211 240    CMP Latest Ref Rng & Units 06/27/2020 06/05/2020 01/09/2020  Glucose 70 - 99 mg/dL 93 - -  BUN 8 - 23 mg/dL _0 Creatinine 0.44 - 1.00 mg/dL 0.88 0.7 0.8  Sodium 135 - 145 mmol/L 142 - 141  Potassium 3.5 - 5.1 mmol/L 3.9 - 4.4  Chloride 98 - 111 mmol/L 102 - 104  CO2 22 - 32 mmol/L 29 - 22  Calcium 8.9 - 10.3 mg/dL 9.5 8.9 9.3  Total Protein 6.5 - 8.1 g/dL 7.6 - -  Total Bilirubin 0.3 - 1.2 mg/dL 0.8 - -  Alkaline Phos 38 - 126 U/L 113 - 106  AST 15 - 41 U/L 49(H) 26 37(A)  ALT 0 - 44 U/L 69(H) 27 40(A)     RADIOGRAPHIC STUDIES: I have personally reviewed the radiological images as listed and agreed with the findings in the report. US BREAST LTD UNI RIGHT INC AXILLA  Result Date: 06/08/2020 CLINICAL DATA:  Possible mass in the 12  o'clock position of the right breast anteriorly on a recent screening mammogram. Calcifications and possible distortion in the 12 o'clock position of the mid left breast on the screening mammogram. EXAM: DIGITAL DIAGNOSTIC BILATERAL MAMMOGRAM WITH CAD AND TOMOSYNTHESIS ULTRASOUND RIGHT BREAST TECHNIQUE: Bilateral digital diagnostic mammography and breast tomosynthesis was performed. Digital images of the breasts were evaluated with computer-aided detection. Targeted ultrasound examination of the Right breast was performed. COMPARISON:  Previous exam(s). ACR Breast Density Category b: There are scattered areas of fibroglandular density. FINDINGS: 3D tomographic and 2D generated spot compression images of the right breast demonstrate a less  prominent irregular mass-like density in the 12 o'clock position of the breast anteriorly. 3D tomographic and 2D generated spot compression and 2D spot magnification images of the left breast demonstrate normal appearing fibroglandular tissue and supporting ligaments at the location of recently suspected distortion in the 12 o'clock position of the breast. There are 2 small groups of calcifications in the left breast, 1 in the central portion of the breast in the 12 o'clock position, spanning 4 mm. The other is in the anterior aspect of the 1 o'clock position of the breast, spanning 4 mm. The calcifications in both groups are small and oval in shape, varying slightly in size and density. Together, the groups span an area measuring 3.9 cm. On physical exam, no mass is palpable in the 12 o'clock position of the right breast or right axilla. Targeted ultrasound is performed, showing a 9 x 7 x 6 mm irregular, hypoechoic mass with adjacent ill-defined increased echogenicity in the 12 o'clock position of the right breast, 2 cm from the nipple. This corresponds to the mammographic mass age. Ultrasound of the right axilla demonstrated normal appearing right axillary lymph nodes. IMPRESSION: 1. 9 mm mass in the 12 o'clock position of the right breast with imaging features highly suspicious for malignancy. 2. 4 mm group of indeterminate calcifications in the 12 o'clock position of the left breast and 4 mm group of indeterminate calcifications in the 1 o'clock position of the left breast. RECOMMENDATION: 1. Ultrasound-guided core needle biopsy of the 9 mm mass in the 12 o'clock position of the right breast. 2. 3D stereotactic guided core needle biopsy of the 4 mm group of calcifications in the 12 o'clock position of the left breast. 3. 3D stereotactic guided core needle biopsy of the 4 mm group of calcifications in the 1 o'clock position of the left breast. This has been discussed the patient and the biopsies have been  scheduled at 7:30 a.m. on 06/21/2020. I have discussed the findings and recommendations with the patient. If applicable, a reminder letter will be sent to the patient regarding the next appointment. BI-RADS CATEGORY  5: Highly suggestive of malignancy. Electronically Signed   By: Claudie Revering M.D.   On: 06/08/2020 12:19   MM DIAG BREAST TOMO BILATERAL  Result Date: 06/08/2020 CLINICAL DATA:  Possible mass in the 12 o'clock position of the right breast anteriorly on a recent screening mammogram. Calcifications and possible distortion in the 12 o'clock position of the mid left breast on the screening mammogram. EXAM: DIGITAL DIAGNOSTIC BILATERAL MAMMOGRAM WITH CAD AND TOMOSYNTHESIS ULTRASOUND RIGHT BREAST TECHNIQUE: Bilateral digital diagnostic mammography and breast tomosynthesis was performed. Digital images of the breasts were evaluated with computer-aided detection. Targeted ultrasound examination of the Right breast was performed. COMPARISON:  Previous exam(s). ACR Breast Density Category b: There are scattered areas of fibroglandular density. FINDINGS: 3D tomographic and 2D generated spot compression images of the  right breast demonstrate a less prominent irregular mass-like density in the 12 o'clock position of the breast anteriorly. 3D tomographic and 2D generated spot compression and 2D spot magnification images of the left breast demonstrate normal appearing fibroglandular tissue and supporting ligaments at the location of recently suspected distortion in the 12 o'clock position of the breast. There are 2 small groups of calcifications in the left breast, 1 in the central portion of the breast in the 12 o'clock position, spanning 4 mm. The other is in the anterior aspect of the 1 o'clock position of the breast, spanning 4 mm. The calcifications in both groups are small and oval in shape, varying slightly in size and density. Together, the groups span an area measuring 3.9 cm. On physical exam, no mass is  palpable in the 12 o'clock position of the right breast or right axilla. Targeted ultrasound is performed, showing a 9 x 7 x 6 mm irregular, hypoechoic mass with adjacent ill-defined increased echogenicity in the 12 o'clock position of the right breast, 2 cm from the nipple. This corresponds to the mammographic mass age. Ultrasound of the right axilla demonstrated normal appearing right axillary lymph nodes. IMPRESSION: 1. 9 mm mass in the 12 o'clock position of the right breast with imaging features highly suspicious for malignancy. 2. 4 mm group of indeterminate calcifications in the 12 o'clock position of the left breast and 4 mm group of indeterminate calcifications in the 1 o'clock position of the left breast. RECOMMENDATION: 1. Ultrasound-guided core needle biopsy of the 9 mm mass in the 12 o'clock position of the right breast. 2. 3D stereotactic guided core needle biopsy of the 4 mm group of calcifications in the 12 o'clock position of the left breast. 3. 3D stereotactic guided core needle biopsy of the 4 mm group of calcifications in the 1 o'clock position of the left breast. This has been discussed the patient and the biopsies have been scheduled at 7:30 a.m. on 06/21/2020. I have discussed the findings and recommendations with the patient. If applicable, a reminder letter will be sent to the patient regarding the next appointment. BI-RADS CATEGORY  5: Highly suggestive of malignancy. Electronically Signed   By: Claudie Revering M.D.   On: 06/08/2020 12:19   MM CLIP PLACEMENT LEFT  Result Date: 06/21/2020 CLINICAL DATA:  Post ultrasound-guided biopsy of a mass in the right breast at the 12 o'clock position and stereotactic guided biopsy of left breast calcifications at the 12 o'clock and 1 o'clock positions. EXAM: DIAGNOSTIC BILATERAL MAMMOGRAM POST ULTRASOUND AND STEREOTACTIC BIOPSIES COMPARISON:  Previous exams. FINDINGS: Mammographic images were obtained following ultrasound-guided biopsy of a mass in  the right breast at the 12 o'clock position and stereotactic guided biopsy of left breast calcifications at the 12 o'clock and 1 o'clock positions. A ribbon shaped biopsy marking clip is present at the site of the biopsied mass in the right breast the 12 o'clock position. There is an approximate 3.5 cm post biopsy hematoma lateral to the site of biopsy. A coil shaped biopsy marking clip is present in felt to be displaced approximately 1.5 cm inferior biopsied calcifications in the left breast at the 12 o'clock position. An X shaped biopsy marking clip is present and felt to be displaced approximately 0.8 cm inferior to the biopsied calcifications in the left breast. IMPRESSION: 1. Ribbon shaped biopsy marking clip at site of biopsied mass in the right breast at the 12 o'clock position. 2. Coil shaped biopsy marking clip felt to be displaced  approximately 1.5 cm inferior to the biopsied calcifications in the left breast at the 12 o'clock position. 3. X shaped biopsy marking clip felt to be displaced approximately 0.8 cm inferior to the biopsied calcifications in the left breast. Final Assessment: Post Procedure Mammograms for Marker Placement Electronically Signed   By: Everlean Alstrom M.D.   On: 06/21/2020 09:14   MM CLIP PLACEMENT RIGHT  Result Date: 06/21/2020 CLINICAL DATA:  Post ultrasound-guided biopsy of a mass in the right breast at the 12 o'clock position and stereotactic guided biopsy of left breast calcifications at the 12 o'clock and 1 o'clock positions. EXAM: DIAGNOSTIC BILATERAL MAMMOGRAM POST ULTRASOUND AND STEREOTACTIC BIOPSIES COMPARISON:  Previous exams. FINDINGS: Mammographic images were obtained following ultrasound-guided biopsy of a mass in the right breast at the 12 o'clock position and stereotactic guided biopsy of left breast calcifications at the 12 o'clock and 1 o'clock positions. A ribbon shaped biopsy marking clip is present at the site of the biopsied mass in the right breast the 12  o'clock position. There is an approximate 3.5 cm post biopsy hematoma lateral to the site of biopsy. A coil shaped biopsy marking clip is present in felt to be displaced approximately 1.5 cm inferior biopsied calcifications in the left breast at the 12 o'clock position. An X shaped biopsy marking clip is present and felt to be displaced approximately 0.8 cm inferior to the biopsied calcifications in the left breast. IMPRESSION: 1. Ribbon shaped biopsy marking clip at site of biopsied mass in the right breast at the 12 o'clock position. 2. Coil shaped biopsy marking clip felt to be displaced approximately 1.5 cm inferior to the biopsied calcifications in the left breast at the 12 o'clock position. 3. X shaped biopsy marking clip felt to be displaced approximately 0.8 cm inferior to the biopsied calcifications in the left breast. Final Assessment: Post Procedure Mammograms for Marker Placement Electronically Signed   By: Everlean Alstrom M.D.   On: 06/21/2020 09:14   MM LT BREAST BX W LOC DEV 1ST LESION IMAGE BX SPEC STEREO GUIDE  Addendum Date: 06/25/2020   ADDENDUM REPORT: 06/25/2020 08:02 ADDENDUM: Pathology revealed GRADE II INVASIVE MAMMARY CARCINOMA, MAMMARY CARCINOMA IN SITU of the RIGHT breast, 12 o'clock. E-cadherin is POSITIVE supporting a ductal origin. This was found to be concordant by Dr. Everlean Alstrom. 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 E-cadherin is POSITIVE supporting a ductal origin. This was found to be concordant by Dr. Everlean Alstrom. 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 E-cadherin is POSITIVE supporting a ductal origin. This was found to be concordant by Dr. Everlean Alstrom. Pathology results were discussed with the patient by telephone. The patient reported doing well after the biopsies with tenderness at the sites. Post biopsy instructions and  care were reviewed and questions were answered. The patient was encouraged to call [facility] for any additional concerns. The patient was referred to [facility] on June 27, 2020. Consider bilateral breast MRI. Pathology results reported by Stacie Acres RN on 06/25/2020. Electronically Signed   By: Franki Cabot M.D.   On: 06/25/2020 08:02   Result Date: 06/25/2020 CLINICAL DATA:  65 year old female with an indeterminate 0.4 cm group of calcifications in the left breast at the 12 o'clock position and an indeterminate 0.4 cm group of calcifications in the left breast  at the 1 o'clock position. EXAM: LEFT BREAST STEREOTACTIC CORE NEEDLE BIOPSY COMPARISON:  Previous exams. FINDINGS: The patient and I discussed the procedure of stereotactic-guided biopsy including benefits and alternatives. We discussed the high likelihood of a successful procedure. We discussed the risks of the procedure including infection, bleeding, tissue injury, clip migration, and inadequate sampling. Informed written consent was given. The usual time out protocol was performed immediately prior to the procedure. SITE 1: LEFT BREAST 12 O'CLOCK: CALCIFICATIONS: Using sterile technique and 1% Lidocaine as local anesthetic, under stereotactic guidance, a 9 gauge vacuum assisted device was used to perform core needle biopsy of the calcifications in the left breast at the 12 o'clock position using a superior to inferior approach. Specimen radiograph was performed showing the presence of calcifications. Specimens with calcifications are identified for pathology. Lesion quadrant: Upper-outer At the conclusion of the procedure, a coil shaped tissue marker clip was deployed into the biopsy cavity. Follow-up 2-view mammogram was performed and dictated separately. SITE 2: LEFT BREAST 1 O'CLOCK: CALCIFICATIONS: Using sterile technique and 1% Lidocaine  as local anesthetic, under stereotactic guidance, a 9 gauge vacuum assisted device was used to perform core needle biopsy of the calcifications in the left breast at the 1 o'clock position using a superior to inferior approach. Specimen radiograph was performed showing the presence of calcifications. Specimens with calcifications are identified for pathology. Lesion quadrant: Upper-outer At the conclusion of the procedure, an X shaped tissue marker clip was deployed into the biopsy cavity. Follow-up 2-view mammogram was performed and dictated separately. IMPRESSION: 1. Stereotactic guided biopsy of calcifications in the left breast at the 12 o'clock position, at site of coil shaped biopsy marking clip. 2. Stereotactic guided biopsy of calcifications in the left breast at the 1 o'clock position, at site of X shaped biopsy marking clip. Electronically Signed: By: Everlean Alstrom M.D. On: 06/21/2020 09:05   MM LT BREAST BX W LOC DEV EA AD LESION IMG BX SPEC STEREO GUIDE  Addendum Date: 06/25/2020   ADDENDUM REPORT: 06/25/2020 08:02 ADDENDUM: Pathology revealed GRADE II INVASIVE MAMMARY CARCINOMA, MAMMARY CARCINOMA IN SITU of the RIGHT breast, 12 o'clock. E-cadherin is POSITIVE supporting a ductal origin. This was found to be concordant by Dr. Everlean Alstrom. 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 E-cadherin is POSITIVE supporting a ductal origin. This was found to be concordant by Dr. Everlean Alstrom. 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 E-cadherin is POSITIVE supporting a ductal origin. This was found to be concordant by Dr. Everlean Alstrom. Pathology results were discussed with the patient by telephone. The patient reported doing well after the biopsies with tenderness at the sites. Post biopsy instructions and care were reviewed and questions were answered. The patient  was encouraged to call [facility] for any additional concerns. The patient was referred to [facility] on June 27, 2020. Consider bilateral breast MRI. Pathology results reported by Stacie Acres RN on 06/25/2020. Electronically Signed   By: Franki Cabot M.D.   On: 06/25/2020 08:02   Result Date: 06/25/2020 CLINICAL DATA:  65 year old female with an indeterminate 0.4 cm group of calcifications in the left breast at the 12 o'clock position and an indeterminate 0.4 cm group of calcifications in the left breast at the 1 o'clock position. EXAM: LEFT BREAST STEREOTACTIC CORE NEEDLE BIOPSY COMPARISON:  Previous  exams. FINDINGS: The patient and I discussed the procedure of stereotactic-guided biopsy including benefits and alternatives. We discussed the high likelihood of a successful procedure. We discussed the risks of the procedure including infection, bleeding, tissue injury, clip migration, and inadequate sampling. Informed written consent was given. The usual time out protocol was performed immediately prior to the procedure. SITE 1: LEFT BREAST 12 O'CLOCK: CALCIFICATIONS: Using sterile technique and 1% Lidocaine as local anesthetic, under stereotactic guidance, a 9 gauge vacuum assisted device was used to perform core needle biopsy of the calcifications in the left breast at the 12 o'clock position using a superior to inferior approach. Specimen radiograph was performed showing the presence of calcifications. Specimens with calcifications are identified for pathology. Lesion quadrant: Upper-outer At the conclusion of the procedure, a coil shaped tissue marker clip was deployed into the biopsy cavity. Follow-up 2-view mammogram was performed and dictated separately. SITE 2: LEFT BREAST 1 O'CLOCK: CALCIFICATIONS: Using sterile technique and 1% Lidocaine as local anesthetic, under stereotactic guidance, a 9 gauge  vacuum assisted device was used to perform core needle biopsy of the calcifications in the left breast at the 1 o'clock position using a superior to inferior approach. Specimen radiograph was performed showing the presence of calcifications. Specimens with calcifications are identified for pathology. Lesion quadrant: Upper-outer At the conclusion of the procedure, an X shaped tissue marker clip was deployed into the biopsy cavity. Follow-up 2-view mammogram was performed and dictated separately. IMPRESSION: 1. Stereotactic guided biopsy of calcifications in the left breast at the 12 o'clock position, at site of coil shaped biopsy marking clip. 2. Stereotactic guided biopsy of calcifications in the left breast at the 1 o'clock position, at site of X shaped biopsy marking clip. Electronically Signed: By: Everlean Alstrom M.D. 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 E-cadherin is POSITIVE supporting a ductal origin. This was found to be concordant by Dr. Everlean Alstrom. 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 E-cadherin is POSITIVE supporting a ductal origin. This was found to be concordant by Dr. Everlean Alstrom. 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 E-cadherin is POSITIVE supporting a ductal origin. This was found to be concordant by Dr. Everlean Alstrom. Pathology results were discussed with the patient by telephone. The patient reported doing well after the biopsies with tenderness at the sites. Post biopsy instructions and care were reviewed and questions were answered. The patient was encouraged to call [facility] for  any additional concerns. The patient was referred to [facility] on June 27, 2020. Consider bilateral breast MRI. Pathology results reported by Stacie Acres RN on 06/25/2020. Electronically Signed   By: Franki Cabot M.D.   On: 06/25/2020 08:03   Result Date: 06/25/2020 CLINICAL DATA:  65 year old female with a suspicious 0.9 cm mass in the right breast at the 12 o'clock position. EXAM: ULTRASOUND GUIDED RIGHT BREAST CORE NEEDLE BIOPSY COMPARISON:  Previous exam(s). PROCEDURE: I met with the patient and we discussed the procedure of ultrasound-guided biopsy, including benefits and alternatives. We discussed the high likelihood of a successful procedure. We discussed the risks of the  procedure, including infection, bleeding, tissue injury, clip migration, and inadequate sampling. Informed written consent was given. The usual time-out protocol was performed immediately prior to the procedure. Lesion quadrant: Upper inner Using sterile technique and 1% Lidocaine as local anesthetic, under direct ultrasound visualization, a 14 gauge spring-loaded device was used to perform biopsy of the mass in the right breast at the 12 o'clock position using a lateral to medial approach. At the conclusion of the procedure a ribbon shaped tissue marker clip was deployed into the biopsy cavity. Follow up 2 view mammogram was performed and dictated separately. IMPRESSION: Ultrasound guided biopsy of the mass in the right breast at the 12 o'clock position. No apparent complications. Electronically Signed: By: Everlean Alstrom M.D. On: 06/21/2020 08:06    ASSESSMENT & PLAN:  Dayanna Pryce is a 65 y.o. Caucasian female with a history of Anal fissure, Atrial septal aneurysm, cataracts, Depression, Fibromyalgia, HLD, HTN, Hypothyroidism, H/o Skin cancer in 08/2012   1. Malignant neoplasm of upper-outer quadrant of right breast, Stage IA, c([id number]),  ER+/PR+/HER2-, Grade II 2. Left breast DCIS, grade II, ER+/PR+  -We discussed her image findings and the biopsy results in great details. She was found to have b/l masses on mammogram. On biopsy she was seen to have 2 small area of  DCIS in left breast and 0.8cm right breast invasive ductal carcinoma. -Given her B/l diseases, Breast MRI is recommended for further evaluation. She is agreeable, but nervous about scan.  -Given the early stage diseases, Bilateral lumpectomy with right axillary Sentinel LN biopsy is recommended. She will consult with Dr Marlou Starks about this and will likely proceed with surgery soon.  -I recommend a Oncotype Dx test on the surgical sample and we'll make a decision about adjuvant chemotherapy based on the Oncotype result. Written material of this test was given to her. She is young and fit, would be a good candidate for chemotherapy if her Oncotype recurrence score is high. -If her surgical sentinel lymph node positive, I recommend mammaprint for further risk stratification and guide adjuvant chemotherapy. -The risk of recurrence depends on the stage and biology of the tumor. She is early stage, with ER/PR positive and HER2 negative markers. I discussed this is the more common type of slow growing tumor.  -I discussed her DCIS will be cured by complete surgical resection. Any form of adjuvant therapy is preventive. -She was also seen by radiation oncologist Dr. Isidore Moos today. Adjuvant radiation of right breast is recommended after lumpectomy to reduce her risk of local recurrence. She will discuss the option of left breast radiation with Dr Isidore Moos. -Given the strong ER and PR expression in her postmenopausal status, I recommend adjuvant endocrine therapy with aromatase inhibitor for a total of 5-10 years to reduce the risk of cancer recurrence. Potential benefits and side effects were discussed with patient and she is interested. She has concerns of side effect on her liver long term,  will monitor.  -Per patient her prior DEXA was normal but close to osteopenia. Will monitor on antiestrogen therapy.  -We also discussed the breast cancer surveillance after her surgery. She will continue annual screening mammogram, self exam, and a routine office visit with lab and exam with [place]. -Labs reviewed, CBC and CMP WNL except AST 49 and ALT 69. Physical exam shows likely benign hematoma and skin ecchymosis.  -F/u after surgery or radiation.    3. Comorbidities: Depression, Fibromyalgia, HLD, HTN, Hypothyroidism, H/o Skin cancer (squamous and basal cell), Fatty liver, sleep apnea  -  She notes having Adrenal fatigue. I discussed based on her recent labs, this is not consistent with adrenal insufficiency. I recommend she see endocrinologist -On medications, continue to f/u with her other physicians.  -She notes being more nervous and anxious about her recent cancer diagnosis.  -I encouraged her to continue healthy diet and weight exercise regularly    4. Genetic testing  -She does not have family history of breast cancer, but her sister had skin cancer. I discussed if she is interested in genetic testing, she will likely have to pay out of pocket. She will think about it.    PLAN:  -MRI breast in 2-3 weeks, open MRI or with ativan.  -Proceed with bilateral breast surgery  -Oncotype on right surgical sample  -F/u after surgery or radiation.  -Refer to SW for counseling of anxiety and cooping of cancer diagnosis    No orders of the defined types were placed in this encounter.   All questions were answered. The patient knows to call the clinic with any problems, questions or concerns. The total time spent in the appointment was 60 minutes.     Truitt Merle, MD 06/27/2020 9:24 PM  I, Joslyn Devon, am acting as scribe for Truitt Merle, MD.   I have reviewed the above documentation for accuracy and completeness, and I agree with the above.

## 2020-06-27 NOTE — Patient Instructions (Signed)

## 2020-06-28 ENCOUNTER — Telehealth: Payer: Self-pay | Admitting: Hematology

## 2020-06-28 ENCOUNTER — Telehealth: Payer: Self-pay | Admitting: *Deleted

## 2020-06-28 ENCOUNTER — Encounter: Payer: Self-pay | Admitting: General Practice

## 2020-06-28 DIAGNOSIS — B079 Viral wart, unspecified: Secondary | ICD-10-CM | POA: Diagnosis not present

## 2020-06-28 DIAGNOSIS — D485 Neoplasm of uncertain behavior of skin: Secondary | ICD-10-CM | POA: Diagnosis not present

## 2020-06-28 NOTE — Telephone Encounter (Signed)
Spoke to pt concerning [place] from 2.16.22. Denies questions or concerns regarding dx or treatment care plan.  Pt was upset regarding the cancellation of her breast MRI for 2/21.22. Pt was told provider cx d/t out of network.  Pt called her insurance company and was informed they do cover breast MRI at GI.  Informed pt that her providers did not cancel her MRI and we were unaware this took place. Pt relate she is starting to feel like she is not being taken care of her and fall through the cracks. Listened and provided emotional support. Discussed with pt that we will look into this issue to ensure it doesn't happen again.   Received verbal understanding. Encourage pt to call with needs. Received verbal understanding.

## 2020-06-28 NOTE — Telephone Encounter (Signed)
Checked out appointment. No LOS notes needing to be scheduled. No changes made. 

## 2020-06-29 NOTE — Progress Notes (Signed)
[facility] Spiritual Care Note  Referred by Counseling Intern Gaylyn Rong for spiritual and emotional support related to new diagnosis. Had hourlong phone conversation with [name], providing opportunity for her to share and process her diagnosis (stressful) vs prognosis (very encouraging), as well as other stressors. She is very reflective and self-aware, and has worked with a Social worker on past issues to help with perspective and healing. [name] values having a pastoral listener who is outside of her personal circles and welcomes follow-up calls. We plan to speak again at 10:00 am on Friday 2/25.   [place], [place], [facility] Pager [phone] Voicemail [phone]

## 2020-07-02 ENCOUNTER — Other Ambulatory Visit: Payer: Medicare HMO

## 2020-07-02 ENCOUNTER — Encounter: Payer: Self-pay | Admitting: *Deleted

## 2020-07-03 ENCOUNTER — Ambulatory Visit
Admission: RE | Admit: 2020-07-03 | Discharge: 2020-07-03 | Disposition: A | Discharge status: Home / Self Care | Payer: Medicare HMO | Source: Ambulatory Visit | Attending: Hematology | Admitting: Hematology

## 2020-07-03 DIAGNOSIS — C50811 Malignant neoplasm of overlapping sites of right female breast: Secondary | ICD-10-CM | POA: Diagnosis not present

## 2020-07-03 DIAGNOSIS — C50411 Malignant neoplasm of upper-outer quadrant of right female breast: Secondary | ICD-10-CM

## 2020-07-03 DIAGNOSIS — Z170 Estrogen receptor positive status [ER+]: Secondary | ICD-10-CM

## 2020-07-03 MED ORDER — GADOBUTROL 1 MMOL/ML IV SOLN
10.0000 mL | Freq: Once | INTRAVENOUS | Status: AC | PRN
  Administered 2020-07-03: 10 mL via INTRAVENOUS

## 2020-07-03 NOTE — Telephone Encounter (Signed)
Not enough information to answer this question may need a visit or video visit

## 2020-07-04 ENCOUNTER — Encounter: Payer: Self-pay | Admitting: *Deleted

## 2020-07-04 ENCOUNTER — Other Ambulatory Visit: Payer: Self-pay | Admitting: General Surgery

## 2020-07-04 ENCOUNTER — Telehealth: Payer: Self-pay | Admitting: *Deleted

## 2020-07-04 DIAGNOSIS — Z170 Estrogen receptor positive status [ER+]: Secondary | ICD-10-CM

## 2020-07-04 DIAGNOSIS — D0512 Intraductal carcinoma in situ of left breast: Secondary | ICD-10-CM

## 2020-07-04 DIAGNOSIS — C50411 Malignant neoplasm of upper-outer quadrant of right female breast: Secondary | ICD-10-CM

## 2020-07-04 NOTE — Telephone Encounter (Signed)
Spoke to pt regarding MRI results. Confirmed future appts. Informed pt to discuss pain medications and anti-estrogen oral therapy effects on liver with Dr. Burr Medico and Dr. Marlou Starks. Received verbal understanding.

## 2020-07-09 DIAGNOSIS — N819 Female genital prolapse, unspecified: Secondary | ICD-10-CM | POA: Diagnosis not present

## 2020-07-09 DIAGNOSIS — M9902 Segmental and somatic dysfunction of thoracic region: Secondary | ICD-10-CM | POA: Diagnosis not present

## 2020-07-09 DIAGNOSIS — M9903 Segmental and somatic dysfunction of lumbar region: Secondary | ICD-10-CM | POA: Diagnosis not present

## 2020-07-09 DIAGNOSIS — N8111 Cystocele, midline: Secondary | ICD-10-CM | POA: Diagnosis not present

## 2020-07-09 DIAGNOSIS — M531 Cervicobrachial syndrome: Secondary | ICD-10-CM | POA: Diagnosis not present

## 2020-07-09 DIAGNOSIS — M9901 Segmental and somatic dysfunction of cervical region: Secondary | ICD-10-CM | POA: Diagnosis not present

## 2020-07-12 DIAGNOSIS — R945 Abnormal results of liver function studies: Secondary | ICD-10-CM | POA: Diagnosis not present

## 2020-07-12 DIAGNOSIS — K760 Fatty (change of) liver, not elsewhere classified: Secondary | ICD-10-CM | POA: Diagnosis not present

## 2020-07-12 DIAGNOSIS — R7989 Other specified abnormal findings of blood chemistry: Secondary | ICD-10-CM | POA: Diagnosis not present

## 2020-07-13 ENCOUNTER — Other Ambulatory Visit: Payer: Medicare HMO

## 2020-07-13 DIAGNOSIS — Z20822 Contact with and (suspected) exposure to covid-19: Secondary | ICD-10-CM

## 2020-07-13 NOTE — Progress Notes (Signed)
Pt called to inquire about the actual day of surgery in relation to her covid test. The pt then stated that she had covid on 06/18/20, but never got tested. Pt advised that the decision to proceed will be up to the ordering provider based on the results.

## 2020-07-14 ENCOUNTER — Other Ambulatory Visit (HOSPITAL_COMMUNITY): Payer: Medicare HMO

## 2020-07-14 LAB — NOVEL CORONAVIRUS, NAA: SARS-CoV-2, NAA: NOT DETECTED

## 2020-07-14 LAB — SARS-COV-2, NAA 2 DAY TAT

## 2020-07-17 DIAGNOSIS — N8182 Incompetence or weakening of pubocervical tissue: Secondary | ICD-10-CM | POA: Diagnosis not present

## 2020-07-18 ENCOUNTER — Other Ambulatory Visit (HOSPITAL_COMMUNITY): Payer: Medicare HMO

## 2020-07-18 DIAGNOSIS — R69 Illness, unspecified: Secondary | ICD-10-CM | POA: Diagnosis not present

## 2020-07-20 ENCOUNTER — Encounter (HOSPITAL_COMMUNITY): Payer: Self-pay

## 2020-07-20 ENCOUNTER — Encounter (HOSPITAL_COMMUNITY)
Admission: RE | Admit: 2020-07-20 | Discharge: 2020-07-20 | Disposition: A | Discharge status: Home / Self Care | Payer: Medicare HMO | Source: Ambulatory Visit | Attending: General Surgery | Admitting: General Surgery

## 2020-07-20 ENCOUNTER — Other Ambulatory Visit: Payer: Self-pay

## 2020-07-20 DIAGNOSIS — Z01818 Encounter for other preprocedural examination: Secondary | ICD-10-CM | POA: Diagnosis not present

## 2020-07-20 DIAGNOSIS — Z20822 Contact with and (suspected) exposure to covid-19: Secondary | ICD-10-CM | POA: Diagnosis not present

## 2020-07-20 LAB — COMPREHENSIVE METABOLIC PANEL
ALT: 54 U/L — ABNORMAL HIGH (ref 0–44)
AST: 35 U/L (ref 15–41)
Albumin: 4.2 g/dL (ref 3.5–5.0)
Alkaline Phosphatase: 106 U/L (ref 38–126)
Anion gap: 8 (ref 5–15)
BUN: 17 mg/dL (ref 8–23)
CO2: 27 mmol/L (ref 22–32)
Calcium: 9.4 mg/dL (ref 8.9–10.3)
Chloride: 105 mmol/L (ref 98–111)
Creatinine, Ser: 0.76 mg/dL (ref 0.44–1.00)
GFR, Estimated: >60 mL/min (ref >60)
Glucose, Bld: 92 mg/dL (ref 70–99)
Potassium: 3.3 mmol/L — ABNORMAL LOW (ref 3.5–5.1)
Sodium: 140 mmol/L (ref 135–145)
Total Bilirubin: 0.8 mg/dL (ref 0.3–1.2)
Total Protein: 6.9 g/dL (ref 6.5–8.1)

## 2020-07-20 LAB — CBC
HCT: 41.7 % (ref 36.0–46.0)
Hemoglobin: 13.6 g/dL (ref 12.0–15.0)
MCH: 30.4 pg (ref 26.0–34.0)
MCHC: 32.6 g/dL (ref 30.0–36.0)
MCV: 93.3 fL (ref 80.0–100.0)
Platelets: 207 10*3/uL (ref 150–400)
RBC: 4.47 MIL/uL (ref 3.87–5.11)
RDW: 13.8 % (ref 11.5–15.5)
WBC: 8.8 10*3/uL (ref 4.0–10.5)
nRBC: 0 % (ref 0.0–0.2)

## 2020-07-20 NOTE — Progress Notes (Signed)
Your procedure is scheduled on Thursday July 26, 2020.  Report to [facility] Main Entrance "A" at 05:30 A.M., and check in at the Admitting office.  Call this number if you have problems the morning of surgery: [phone]  Call [phone] if you have any questions prior to your surgery date Monday-Friday 8am-4pm   Remember: Do not eat after midnight the night before your surgery  You may drink clear liquids until 04:30 AM the morning of your surgery.   Clear liquids allowed are: Water, Non-Citrus Juices (without pulp), Carbonated Beverages, Clear Tea, Black Coffee Only, and Gatorade   Take these medicines the morning of surgery with A SIP OF WATER: hydroxychloroquine (PLAQUENIL) metoprolol succinate (TOPROL XL)  If needed: LORazepam (ATIVAN)    As of today, STOP taking any Aspirin (unless otherwise instructed by your surgeon), Aleve, Naproxen, Ibuprofen, Motrin, Advil, Goody's, BC's, all herbal medications, fish oil, and all vitamins.    The Morning of Surgery  Do not wear jewelry, make-up or nail polish.  Do not wear lotions, powders, or perfumes, or deodorant  Do not shave 48 hours prior to surgery.    Do not bring valuables to the hospital.  [facility] is not responsible for any belongings or valuables.  If you are a smoker, DO NOT Smoke 24 hours prior to surgery  If you wear a CPAP at night please bring your mask the morning of surgery   Remember that you must have someone to transport you home after your surgery, and remain with you for 24 hours if you are discharged the same day.   Please bring cases for contacts, glasses, hearing aids, dentures or bridgework because it cannot be worn into surgery.    Leave your suitcase in the car.  After surgery it may be brought to your room.  For patients admitted to the hospital, discharge time will be determined by your treatment team.  Patients discharged the day of surgery will not be allowed to drive home.     Special instructions:   [facility]- Preparing For Surgery  Before surgery, you can play an important role. Because skin is not sterile, your skin needs to be as free of germs as possible. You can reduce the number of germs on your skin by washing with CHG (chlorahexidine gluconate) Soap before surgery.  CHG is an antiseptic cleaner which kills germs and bonds with the skin to continue killing germs even after washing.    Oral Hygiene is also important to reduce your risk of infection.  Remember - BRUSH YOUR TEETH THE MORNING OF SURGERY WITH YOUR REGULAR TOOTHPASTE  Please do not use if you have an allergy to CHG or antibacterial soaps. If your skin becomes reddened/irritated stop using the CHG.  Do not shave (including legs and underarms) for at least 48 hours prior to first CHG shower. It is OK to shave your face.  Please follow these instructions carefully.   1. Shower the NIGHT BEFORE SURGERY and the MORNING OF SURGERY with CHG Soap.   2. If you chose to wash your hair and body, wash as usual with your normal shampoo and body-wash/soap.  3. Rinse your hair and body thoroughly to remove the shampoo and soap.  4. Apply CHG directly to the skin (ONLY FROM THE NECK DOWN) and wash gently with a scrungie or a clean washcloth.   5. Do not use on open wounds or open sores. Avoid contact with your eyes, ears, mouth and genitals (private  parts). Wash Face and genitals (private parts)  with your normal soap.   6. Wash thoroughly, paying special attention to the area where your surgery will be performed.  7. Thoroughly rinse your body with warm water from the neck down.  8. DO NOT shower/wash with your normal soap after using and rinsing off the CHG Soap.  9. Pat yourself dry with a CLEAN TOWEL.  10. Wear CLEAN PAJAMAS to bed the night before surgery  11. Place CLEAN SHEETS on your bed the night of your first shower and DO NOT SLEEP WITH PETS.  12. Wear comfortable clothes the morning  of surgery.     Day of Surgery:  Please shower the morning of surgery with the CHG soap Do not apply any deodorants/lotions. Please wear clean clothes to the hospital/surgery center.   Remember to brush your teeth WITH YOUR REGULAR TOOTHPASTE.   Please read over the following fact sheets that you were given.

## 2020-07-20 NOTE — Progress Notes (Signed)
PCP Regis Bill, MD Cardiologist -   Chest x-ray -  EKG - 07/20/20 Stress Test - 2005 ECHO - 07/20/17 Cardiac Cath -    ERAS Protcol - yes PRE-SURGERY Ensure or G2- no drink ordered  COVID TEST- 07/23/20   Anesthesia review: n/a  Patient denies shortness of breath, fever, cough and chest pain at PAT appointment   All instructions explained to the patient, with a verbal understanding of the material. Patient agrees to go over the instructions while at home for a better understanding. Patient also instructed to self quarantine after being tested for COVID-19. The opportunity to ask questions was provided.

## 2020-07-23 ENCOUNTER — Other Ambulatory Visit (HOSPITAL_COMMUNITY)
Admission: RE | Admit: 2020-07-23 | Discharge: 2020-07-23 | Disposition: A | Discharge status: Home / Self Care | Payer: Medicare HMO | Source: Ambulatory Visit | Attending: General Surgery | Admitting: General Surgery

## 2020-07-23 DIAGNOSIS — M531 Cervicobrachial syndrome: Secondary | ICD-10-CM | POA: Diagnosis not present

## 2020-07-23 DIAGNOSIS — Z01812 Encounter for preprocedural laboratory examination: Secondary | ICD-10-CM | POA: Insufficient documentation

## 2020-07-23 DIAGNOSIS — M9901 Segmental and somatic dysfunction of cervical region: Secondary | ICD-10-CM | POA: Diagnosis not present

## 2020-07-23 DIAGNOSIS — M9902 Segmental and somatic dysfunction of thoracic region: Secondary | ICD-10-CM | POA: Diagnosis not present

## 2020-07-23 DIAGNOSIS — Z20822 Contact with and (suspected) exposure to covid-19: Secondary | ICD-10-CM | POA: Diagnosis not present

## 2020-07-23 DIAGNOSIS — M9903 Segmental and somatic dysfunction of lumbar region: Secondary | ICD-10-CM | POA: Diagnosis not present

## 2020-07-23 LAB — SARS CORONAVIRUS 2 (TAT 6-24 HRS): SARS Coronavirus 2: NEGATIVE

## 2020-07-25 ENCOUNTER — Ambulatory Visit
Admission: RE | Admit: 2020-07-25 | Discharge: 2020-07-25 | Disposition: A | Discharge status: Home / Self Care | Payer: Medicare HMO | Source: Ambulatory Visit | Attending: General Surgery | Admitting: General Surgery

## 2020-07-25 ENCOUNTER — Ambulatory Visit: Payer: Medicare HMO | Admitting: Hematology

## 2020-07-25 ENCOUNTER — Other Ambulatory Visit: Payer: Self-pay

## 2020-07-25 ENCOUNTER — Other Ambulatory Visit: Payer: Medicare HMO

## 2020-07-25 DIAGNOSIS — C50411 Malignant neoplasm of upper-outer quadrant of right female breast: Secondary | ICD-10-CM

## 2020-07-25 DIAGNOSIS — D0512 Intraductal carcinoma in situ of left breast: Secondary | ICD-10-CM | POA: Diagnosis not present

## 2020-07-25 DIAGNOSIS — C50811 Malignant neoplasm of overlapping sites of right female breast: Secondary | ICD-10-CM | POA: Diagnosis not present

## 2020-07-25 DIAGNOSIS — Z170 Estrogen receptor positive status [ER+]: Secondary | ICD-10-CM

## 2020-07-25 NOTE — Anesthesia Preprocedure Evaluation (Addendum)
Anesthesia Evaluation  Patient identified by MRN, date of birth, ID band Patient awake    Reviewed: Allergy & Precautions, NPO status , Patient's Chart, lab work & pertinent test results, reviewed documented beta blocker date and time   Airway Mallampati: II  TM Distance: >3 FB Neck ROM: Full    Dental  (+) Dental Advisory Given, Caps, Teeth Intact, Chipped,    Pulmonary sleep apnea and Continuous Positive Airway Pressure Ventilation ,  Covid 1/22   Pulmonary exam normal breath sounds clear to auscultation       Cardiovascular hypertension, Pt. on medications and Pt. on home beta blockers Normal cardiovascular exam Rhythm:Regular Rate:Normal     Neuro/Psych  Headaches, PSYCHIATRIC DISORDERS Anxiety Depression  Neuromuscular disease    GI/Hepatic Neg liver ROS, GERD  Controlled,  Endo/Other  Hypothyroidism Obesity   Renal/GU negative Renal ROS     Musculoskeletal  (+) Fibromyalgia -  Abdominal   Peds  Hematology negative hematology ROS (+)   Anesthesia Other Findings   Reproductive/Obstetrics                            Anesthesia Physical Anesthesia Plan  ASA: II  Anesthesia Plan: General   Post-op Pain Management:  Regional for Post-op pain   Induction: Intravenous  PONV Risk Score and Plan: 3 and Midazolam, Dexamethasone, Ondansetron, Scopolamine patch - Pre-op and TIVA  Airway Management Planned: LMA  Additional Equipment:   Intra-op Plan:   Post-operative Plan: Extubation in OR  Informed Consent: I have reviewed the patients History and Physical, chart, labs and discussed the procedure including the risks, benefits and alternatives for the proposed anesthesia with the patient or authorized representative who has indicated his/her understanding and acceptance.     Dental advisory given  Plan Discussed with: CRNA  Anesthesia Plan Comments: (Right PEC block)      Anesthesia Quick Evaluation

## 2020-07-26 ENCOUNTER — Ambulatory Visit
Admission: RE | Admit: 2020-07-26 | Discharge: 2020-07-26 | Disposition: A | Discharge status: Home / Self Care | Payer: Medicare HMO | Source: Ambulatory Visit | Attending: General Surgery | Admitting: General Surgery

## 2020-07-26 ENCOUNTER — Other Ambulatory Visit: Payer: Self-pay

## 2020-07-26 ENCOUNTER — Other Ambulatory Visit (HOSPITAL_COMMUNITY): Payer: Medicare HMO

## 2020-07-26 ENCOUNTER — Ambulatory Visit (HOSPITAL_COMMUNITY): Payer: Medicare HMO | Admitting: Anesthesiology

## 2020-07-26 ENCOUNTER — Ambulatory Visit (HOSPITAL_COMMUNITY)
Admission: RE | Admit: 2020-07-26 | Discharge: 2020-07-26 | Disposition: A | Discharge status: Home / Self Care | Payer: Medicare HMO | Source: Ambulatory Visit | Attending: General Surgery | Admitting: General Surgery

## 2020-07-26 ENCOUNTER — Encounter (HOSPITAL_COMMUNITY): Payer: Self-pay | Admitting: General Surgery

## 2020-07-26 ENCOUNTER — Encounter (HOSPITAL_COMMUNITY)
Admission: RE | Disposition: A | Discharge status: Home / Self Care | Payer: Self-pay | Source: Home / Self Care | Attending: General Surgery

## 2020-07-26 ENCOUNTER — Ambulatory Visit (HOSPITAL_COMMUNITY)
Admission: RE | Admit: 2020-07-26 | Discharge: 2020-07-26 | Disposition: A | Discharge status: Home / Self Care | Payer: Medicare HMO | Attending: General Surgery | Admitting: General Surgery

## 2020-07-26 DIAGNOSIS — Z170 Estrogen receptor positive status [ER+]: Secondary | ICD-10-CM

## 2020-07-26 DIAGNOSIS — D0511 Intraductal carcinoma in situ of right breast: Secondary | ICD-10-CM | POA: Diagnosis present

## 2020-07-26 DIAGNOSIS — C50411 Malignant neoplasm of upper-outer quadrant of right female breast: Secondary | ICD-10-CM

## 2020-07-26 DIAGNOSIS — E7800 Pure hypercholesterolemia, unspecified: Secondary | ICD-10-CM | POA: Diagnosis not present

## 2020-07-26 DIAGNOSIS — D360 Benign neoplasm of lymph nodes: Secondary | ICD-10-CM | POA: Insufficient documentation

## 2020-07-26 DIAGNOSIS — G8918 Other acute postprocedural pain: Secondary | ICD-10-CM | POA: Diagnosis not present

## 2020-07-26 DIAGNOSIS — D242 Benign neoplasm of left breast: Secondary | ICD-10-CM | POA: Diagnosis not present

## 2020-07-26 DIAGNOSIS — Z808 Family history of malignant neoplasm of other organs or systems: Secondary | ICD-10-CM | POA: Diagnosis not present

## 2020-07-26 DIAGNOSIS — D0512 Intraductal carcinoma in situ of left breast: Secondary | ICD-10-CM | POA: Diagnosis not present

## 2020-07-26 DIAGNOSIS — C773 Secondary and unspecified malignant neoplasm of axilla and upper limb lymph nodes: Secondary | ICD-10-CM | POA: Diagnosis not present

## 2020-07-26 DIAGNOSIS — C50912 Malignant neoplasm of unspecified site of left female breast: Secondary | ICD-10-CM | POA: Diagnosis not present

## 2020-07-26 DIAGNOSIS — C50911 Malignant neoplasm of unspecified site of right female breast: Secondary | ICD-10-CM | POA: Diagnosis not present

## 2020-07-26 HISTORY — PX: BREAST LUMPECTOMY WITH RADIOACTIVE SEED LOCALIZATION: SHX6424

## 2020-07-26 HISTORY — PX: BREAST LUMPECTOMY WITH RADIOACTIVE SEED AND SENTINEL LYMPH NODE BIOPSY: SHX6550

## 2020-07-26 HISTORY — DX: Sleep apnea, unspecified: G47.30

## 2020-07-26 SURGERY — BREAST LUMPECTOMY WITH RADIOACTIVE SEED LOCALIZATION
Anesthesia: General | Site: Breast | Laterality: Right

## 2020-07-26 MED ORDER — TECHNETIUM TC 99M TILMANOCEPT KIT
1.0000 | PACK | Freq: Once | INTRAVENOUS | Status: AC | PRN
  Administered 2020-07-26: 1 via INTRADERMAL

## 2020-07-26 MED ORDER — ONDANSETRON HCL 4 MG/2ML IJ SOLN
INTRAMUSCULAR | Status: DC | PRN
  Administered 2020-07-26: 4 mg via INTRAVENOUS

## 2020-07-26 MED ORDER — FENTANYL CITRATE (PF) 100 MCG/2ML IJ SOLN
50.0000 ug | Freq: Once | INTRAMUSCULAR | Status: AC
  Administered 2020-07-26: 50 ug via INTRAVENOUS

## 2020-07-26 MED ORDER — EPHEDRINE SULFATE-NACL 50-0.9 MG/10ML-% IV SOSY
PREFILLED_SYRINGE | INTRAVENOUS | Status: DC | PRN
  Administered 2020-07-26: 15 mg via INTRAVENOUS

## 2020-07-26 MED ORDER — ORAL CARE MOUTH RINSE: 15.0000 mL | Freq: Once | OROMUCOSAL | Status: AC

## 2020-07-26 MED ORDER — MIDAZOLAM HCL 2 MG/2ML IJ SOLN: 2.0000 mg | Freq: Once | INTRAMUSCULAR | Status: AC

## 2020-07-26 MED ORDER — AMISULPRIDE (ANTIEMETIC) 5 MG/2ML IV SOLN
INTRAVENOUS | Status: AC
  Administered 2020-07-26: 10 mg
  Filled 2020-07-26: qty 4

## 2020-07-26 MED ORDER — BUPIVACAINE-EPINEPHRINE (PF) 0.25% -1:200000 IJ SOLN
INTRAMUSCULAR | Status: AC
  Filled 2020-07-26: qty 30

## 2020-07-26 MED ORDER — CHLORHEXIDINE GLUCONATE CLOTH 2 % EX PADS: 6.0000 | MEDICATED_PAD | Freq: Once | CUTANEOUS | Status: DC

## 2020-07-26 MED ORDER — BUPIVACAINE-EPINEPHRINE (PF) 0.25% -1:200000 IJ SOLN
INTRAMUSCULAR | Status: DC | PRN
  Administered 2020-07-26: 10 mL
  Administered 2020-07-26: 20 mL

## 2020-07-26 MED ORDER — PROPOFOL 10 MG/ML IV BOLUS
INTRAVENOUS | Status: AC
  Filled 2020-07-26: qty 20

## 2020-07-26 MED ORDER — FENTANYL CITRATE (PF) 100 MCG/2ML IJ SOLN: 50.0000 ug | Freq: Once | INTRAMUSCULAR | Status: AC

## 2020-07-26 MED ORDER — FENTANYL CITRATE (PF) 100 MCG/2ML IJ SOLN
INTRAMUSCULAR | Status: AC
  Filled 2020-07-26: qty 2

## 2020-07-26 MED ORDER — AMISULPRIDE (ANTIEMETIC) 5 MG/2ML IV SOLN: 10.0000 mg | Freq: Once | INTRAVENOUS | Status: DC

## 2020-07-26 MED ORDER — ONDANSETRON HCL 4 MG/2ML IJ SOLN
INTRAMUSCULAR | Status: AC
  Filled 2020-07-26: qty 2

## 2020-07-26 MED ORDER — ONDANSETRON HCL 4 MG/2ML IJ SOLN
4.0000 mg | Freq: Once | INTRAMUSCULAR | Status: AC | PRN
  Administered 2020-07-26: 4 mg via INTRAVENOUS

## 2020-07-26 MED ORDER — SODIUM CHLORIDE (PF) 0.9 % IJ SOLN
INTRAMUSCULAR | Status: AC
  Filled 2020-07-26: qty 10

## 2020-07-26 MED ORDER — MIDAZOLAM HCL 2 MG/2ML IJ SOLN
INTRAMUSCULAR | Status: AC
  Filled 2020-07-26: qty 2

## 2020-07-26 MED ORDER — PROPOFOL 10 MG/ML IV BOLUS
INTRAVENOUS | Status: DC | PRN
  Administered 2020-07-26: 170 mg via INTRAVENOUS

## 2020-07-26 MED ORDER — FENTANYL CITRATE (PF) 250 MCG/5ML IJ SOLN
INTRAMUSCULAR | Status: DC | PRN
  Administered 2020-07-26 (×2): 50 ug via INTRAVENOUS

## 2020-07-26 MED ORDER — CLONIDINE HCL (ANALGESIA) 100 MCG/ML EP SOLN
EPIDURAL | Status: DC | PRN
Start: 2020-07-26 — End: 2020-07-26
  Administered 2020-07-26: 50 ug

## 2020-07-26 MED ORDER — PHENYLEPHRINE HCL-NACL 10-0.9 MG/250ML-% IV SOLN
INTRAVENOUS | Status: DC | PRN
  Administered 2020-07-26: 50 ug/min via INTRAVENOUS

## 2020-07-26 MED ORDER — LIDOCAINE 2% (20 MG/ML) 5 ML SYRINGE
INTRAMUSCULAR | Status: DC | PRN
  Administered 2020-07-26: 80 mg via INTRAVENOUS

## 2020-07-26 MED ORDER — CELECOXIB 200 MG PO CAPS
200.0000 mg | ORAL_CAPSULE | ORAL | Status: AC
  Administered 2020-07-26: 200 mg via ORAL
  Filled 2020-07-26: qty 1

## 2020-07-26 MED ORDER — VANCOMYCIN HCL IN DEXTROSE 1-5 GM/200ML-% IV SOLN
1000.0000 mg | INTRAVENOUS | Status: AC
  Administered 2020-07-26: 1000 mg via INTRAVENOUS
  Filled 2020-07-26: qty 200

## 2020-07-26 MED ORDER — DEXAMETHASONE SODIUM PHOSPHATE 10 MG/ML IJ SOLN
INTRAMUSCULAR | Status: DC | PRN
  Administered 2020-07-26: 5 mg via INTRAVENOUS

## 2020-07-26 MED ORDER — CHLORHEXIDINE GLUCONATE 0.12 % MT SOLN
15.0000 mL | Freq: Once | OROMUCOSAL | Status: AC
  Administered 2020-07-26: 15 mL via OROMUCOSAL
  Filled 2020-07-26: qty 15

## 2020-07-26 MED ORDER — 0.9 % SODIUM CHLORIDE (POUR BTL) OPTIME
TOPICAL | Status: DC | PRN
  Administered 2020-07-26: 1000 mL

## 2020-07-26 MED ORDER — FENTANYL CITRATE (PF) 250 MCG/5ML IJ SOLN
INTRAMUSCULAR | Status: AC
  Filled 2020-07-26: qty 5

## 2020-07-26 MED ORDER — FENTANYL CITRATE (PF) 100 MCG/2ML IJ SOLN
INTRAMUSCULAR | Status: AC
  Administered 2020-07-26: 50 ug via INTRAVENOUS
  Filled 2020-07-26: qty 2

## 2020-07-26 MED ORDER — MIDAZOLAM HCL 2 MG/2ML IJ SOLN
INTRAMUSCULAR | Status: AC
  Administered 2020-07-26: 2 mg via INTRAVENOUS
  Filled 2020-07-26: qty 2

## 2020-07-26 MED ORDER — GABAPENTIN 300 MG PO CAPS
300.0000 mg | ORAL_CAPSULE | ORAL | Status: AC
  Administered 2020-07-26: 300 mg via ORAL
  Filled 2020-07-26: qty 1

## 2020-07-26 MED ORDER — TRAMADOL HCL 50 MG PO TABS: 50.0000 mg | ORAL_TABLET | Freq: Four times a day (QID) | ORAL | 1 refills | Status: DC | PRN

## 2020-07-26 MED ORDER — BUPIVACAINE-EPINEPHRINE (PF) 0.5% -1:200000 IJ SOLN
INTRAMUSCULAR | Status: DC | PRN
  Administered 2020-07-26: 30 mL via PERINEURAL

## 2020-07-26 MED ORDER — STERILE WATER FOR IRRIGATION IR SOLN
Status: DC | PRN
  Administered 2020-07-26: 1000 mL

## 2020-07-26 MED ORDER — METHYLENE BLUE 0.5 % INJ SOLN
INTRAVENOUS | Status: AC
  Filled 2020-07-26: qty 10

## 2020-07-26 MED ORDER — FENTANYL CITRATE (PF) 100 MCG/2ML IJ SOLN
25.0000 ug | INTRAMUSCULAR | Status: DC | PRN
  Administered 2020-07-26: 25 ug via INTRAVENOUS

## 2020-07-26 MED ORDER — LACTATED RINGERS IV SOLN: INTRAVENOUS | Status: DC

## 2020-07-26 SURGICAL SUPPLY — 49 items
ADH SKN CLS APL DERMABOND .7 (GAUZE/BANDAGES/DRESSINGS) ×2
APL PRP STRL LF DISP 70% ISPRP (MISCELLANEOUS) ×2
APPLIER CLIP 9.375 MED OPEN (MISCELLANEOUS) ×8
APR CLP MED 9.3 20 MLT OPN (MISCELLANEOUS) ×4
BINDER BREAST XLRG (GAUZE/BANDAGES/DRESSINGS) ×2 IMPLANT
BLADE SURG 15 STRL LF DISP TIS (BLADE) ×2 IMPLANT
BLADE SURG 15 STRL SS (BLADE) ×4
CANISTER SUC SOCK COL 7IN (MISCELLANEOUS) ×4 IMPLANT
CANISTER SUCT 1200ML W/VALVE (MISCELLANEOUS) ×4 IMPLANT
CHLORAPREP W/TINT 26 (MISCELLANEOUS) ×4 IMPLANT
CLIP APPLIE 9.375 MED OPEN (MISCELLANEOUS) ×2 IMPLANT
COVER BACK TABLE 60X90IN (DRAPES) ×4 IMPLANT
COVER MAYO STAND STRL (DRAPES) ×4 IMPLANT
COVER PROBE W GEL 5X96 (DRAPES) ×4 IMPLANT
COVER WAND RF STERILE (DRAPES) IMPLANT
DECANTER SPIKE VIAL GLASS SM (MISCELLANEOUS) IMPLANT
DERMABOND ADVANCED (GAUZE/BANDAGES/DRESSINGS) ×2
DERMABOND ADVANCED .7 DNX12 (GAUZE/BANDAGES/DRESSINGS) ×2 IMPLANT
DRAPE LAPAROSCOPIC ABDOMINAL (DRAPES) ×4 IMPLANT
DRAPE UTILITY XL STRL (DRAPES) ×4 IMPLANT
DRSG PAD ABDOMINAL 8X10 ST (GAUZE/BANDAGES/DRESSINGS) ×6 IMPLANT
ELECT COATED BLADE 2.86 ST (ELECTRODE) ×4 IMPLANT
ELECT REM PT RETURN 9FT ADLT (ELECTROSURGICAL) ×4
ELECTRODE REM PT RTRN 9FT ADLT (ELECTROSURGICAL) ×2 IMPLANT
GLOVE SURG ENC MOIS LTX SZ7.5 (GLOVE) ×8 IMPLANT
GOWN STRL REUS W/ TWL LRG LVL3 (GOWN DISPOSABLE) ×4 IMPLANT
GOWN STRL REUS W/TWL LRG LVL3 (GOWN DISPOSABLE) ×8
ILLUMINATOR WAVEGUIDE N/F (MISCELLANEOUS) IMPLANT
KIT MARKER MARGIN INK (KITS) ×7 IMPLANT
LIGHT WAVEGUIDE WIDE FLAT (MISCELLANEOUS) IMPLANT
NDL HYPO 25X1 1.5 SAFETY (NEEDLE) ×2 IMPLANT
NDL SAFETY ECLIPSE 18X1.5 (NEEDLE) IMPLANT
NEEDLE HYPO 18GX1.5 SHARP (NEEDLE)
NEEDLE HYPO 25X1 1.5 SAFETY (NEEDLE) ×4 IMPLANT
NS IRRIG 1000ML POUR BTL (IV SOLUTION) IMPLANT
PACK BASIN DAY SURGERY FS (CUSTOM PROCEDURE TRAY) ×4 IMPLANT
PENCIL SMOKE EVACUATOR (MISCELLANEOUS) ×4 IMPLANT
SLEEVE SCD COMPRESS KNEE MED (STOCKING) ×4 IMPLANT
SPONGE LAP 18X18 RF (DISPOSABLE) ×4 IMPLANT
SUT MON AB 4-0 PC3 18 (SUTURE) ×8 IMPLANT
SUT SILK 2 0 SH (SUTURE) IMPLANT
SUT VICRYL 3-0 CR8 SH (SUTURE) ×4 IMPLANT
SYR BULB IRRIG 60ML STRL (SYRINGE) ×3 IMPLANT
SYR CONTROL 10ML LL (SYRINGE) ×4 IMPLANT
TOWEL GREEN STERILE FF (TOWEL DISPOSABLE) ×4 IMPLANT
TRAY FAXITRON CT DISP (TRAY / TRAY PROCEDURE) ×4 IMPLANT
TUBE CONNECTING 20'X1/4 (TUBING) ×1
TUBE CONNECTING 20X1/4 (TUBING) ×3 IMPLANT
YANKAUER SUCT BULB TIP NO VENT (SUCTIONS) IMPLANT

## 2020-07-26 NOTE — Anesthesia Procedure Notes (Signed)
Procedure Name: LMA Insertion Date/Time: 07/26/2020 8:33 AM Performed by: Janace Litten, CRNA Pre-anesthesia Checklist: Patient identified, Emergency Drugs available, Suction available and Patient being monitored Patient Re-evaluated:Patient Re-evaluated prior to induction Oxygen Delivery Method: Circle System Utilized Preoxygenation: Pre-oxygenation with 100% oxygen Induction Type: IV induction Ventilation: Mask ventilation without difficulty LMA: LMA inserted LMA Size: 3.0 Number of attempts: 1 Placement Confirmation: positive ETCO2 Tube secured with: Tape Dental Injury: Teeth and Oropharynx as per pre-operative assessment

## 2020-07-26 NOTE — Transfer of Care (Signed)
Immediate Anesthesia Transfer of Care Note  Patient: [name]  Procedure(s) Performed: LEFT BREAST LUMPECTOMY X 2  WITH RADIOACTIVE SEED LOCALIZATION (Left Breast) RIGHT BREAST LUMPECTOMY WITH RADIOACTIVE SEED AND SENTINEL LYMPH NODE BIOPSY (Right Breast)  Patient Location: PACU  Anesthesia Type:GA combined with regional for post-op pain  Level of Consciousness: drowsy, patient cooperative and responds to stimulation  Airway & Oxygen Therapy: Patient Spontanous Breathing  Post-op Assessment: Report given to RN and Post -op Vital signs reviewed and stable  Post vital signs: Reviewed and stable  Last Vitals:  Vitals Value Taken Time  BP 154/73 07/26/20 1032  Temp    Pulse 80 07/26/20 1032  Resp 7 07/26/20 1032  SpO2 96 % 07/26/20 1032  Vitals shown include unvalidated device data.  Last Pain:  Vitals:   07/26/20 0810  TempSrc:   PainSc: 0-No pain      Patients Stated Pain Goal: 4 (02/25/50 0258)  Complications: No complications documented.

## 2020-07-26 NOTE — H&P (Signed)
[name]  Location: [facility] Patient #: [id number] DOB: 09/17/1955 Undefined / Language: Cleophus Molt / Race: White Female   History of Present Illness  The patient is a 65 year old female who presents with breast cancer.We are asked to see the patient in consultation by Dr. Burr Medico to evaluate her for new bilateral breast cancer. The patient is a 65 year old white female who presents with a 66mm right breast assymetry and 2 12mm areas of calcs 3cm apart in the left breast on screening mammogram. The right axilla looked neg. The right breast came back as IDC with lobular features that was ER and PR+ and Her2- with a Ki67 of 10%. The left 2 areas came back as DCIS.   Past Surgical History Knee Surgery  Bilateral. Tonsillectomy   Diagnostic Studies History  Mammogram  within last year Pap Smear  >5 years ago  Medication History Medications Reconciled  Social History  Alcohol use  Occasional alcohol use. No caffeine use  No drug use  Tobacco use  Never smoker.  Family History  Arthritis  Sister. Depression  Sister. Melanoma  Sister. Thyroid problems  Daughter, Mother, Sister.  Pregnancy / Birth History  Age at menarche  10 years. Age of menopause  [age] Gravida  4 Length (months) of breastfeeding  7-12 Maternal age  43-25 Para  6 Regular periods   Other Problems  Anxiety Disorder  Back Pain  Bladder Problems  High blood pressure  Hypercholesterolemia  Lump In Breast  Thyroid Disease     Review of Systems  General Not Present- Appetite Loss, Chills, Fatigue, Fever, Night Sweats, Weight Gain and Weight Loss. Skin Not Present- Change in Wart/Mole, Dryness, Hives, Jaundice, New Lesions, Non-Healing Wounds, Rash and Ulcer. HEENT Present- Seasonal Allergies and Wears glasses/contact lenses. Not Present- Earache, Hearing Loss, Hoarseness, Nose Bleed, Oral Ulcers, Ringing in the Ears, Sinus Pain, Sore Throat, Visual Disturbances  and Yellow Eyes. Respiratory Present- Snoring. Not Present- Bloody sputum, Chronic Cough, Difficulty Breathing and Wheezing. Breast Present- Breast Pain. Not Present- Breast Mass, Nipple Discharge and Skin Changes. Cardiovascular Present- Leg Cramps. Not Present- Chest Pain, Difficulty Breathing Lying Down, Palpitations, Rapid Heart Rate, Shortness of Breath and Swelling of Extremities. Gastrointestinal Not Present- Abdominal Pain, Bloating, Bloody Stool, Change in Bowel Habits, Chronic diarrhea, Constipation, Difficulty Swallowing, Excessive gas, Gets full quickly at meals, Hemorrhoids, Indigestion, Nausea, Rectal Pain and Vomiting. Female Genitourinary Present- Frequency. Not Present- Nocturia, Painful Urination, Pelvic Pain and Urgency. Musculoskeletal Not Present- Back Pain, Joint Pain, Joint Stiffness, Muscle Pain, Muscle Weakness and Swelling of Extremities. Neurological Not Present- Decreased Memory, Fainting, Headaches, Numbness, Seizures, Tingling, Tremor, Trouble walking and Weakness. Psychiatric Present- Anxiety. Not Present- Bipolar, Change in Sleep Pattern, Depression, Fearful and Frequent crying. Endocrine Not Present- Cold Intolerance, Excessive Hunger, Hair Changes, Heat Intolerance, Hot flashes and New Diabetes. Hematology Not Present- Blood Thinners, Easy Bruising, Excessive bleeding, Gland problems, HIV and Persistent Infections.   Physical Exam General Mental Status-Alert. General Appearance-Consistent with stated age. Hydration-Well hydrated. Voice-Normal.  Head and Neck Head-normocephalic, atraumatic with no lesions or palpable masses. Trachea-midline. Thyroid Gland Characteristics - normal size and consistency.  Eye Eyeball - Bilateral-Extraocular movements intact. Sclera/Conjunctiva - Bilateral-No scleral icterus.  Chest and Lung Exam Chest and lung exam reveals -quiet, even and easy respiratory effort with no use of accessory muscles and on  auscultation, normal breath sounds, no adventitious sounds and normal vocal resonance. Inspection Chest Wall - Normal. Back - normal.  Breast Note: There  are palpable bruises centrally in both breast. other than this there is no palpable mass in either breast. there is no palpable axillary, supraclavicular, or cervical lymphadenopathy   Cardiovascular Cardiovascular examination reveals -normal heart sounds, regular rate and rhythm with no murmurs and normal pedal pulses bilaterally.  Abdomen Inspection Inspection of the abdomen reveals - No Hernias. Skin - Scar - no surgical scars. Palpation/Percussion Palpation and Percussion of the abdomen reveal - Soft, Non Tender, No Rebound tenderness, No Rigidity (guarding) and No hepatosplenomegaly. Auscultation Auscultation of the abdomen reveals - Bowel sounds normal.  Neurologic Neurologic evaluation reveals -alert and oriented x 3 with no impairment of recent or remote memory. Mental Status-Normal.  Musculoskeletal Normal Exam - Left-Upper Extremity Strength Normal and Lower Extremity Strength Normal. Normal Exam - Right-Upper Extremity Strength Normal and Lower Extremity Strength Normal.  Lymphatic Head & Neck  General Head & Neck Lymphatics: Bilateral - Description - Normal. Axillary  General Axillary Region: Bilateral - Description - Normal. Tenderness - Non Tender. Femoral & Inguinal  Generalized Femoral & Inguinal Lymphatics: Bilateral - Description - Normal. Tenderness - Non Tender.    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 I have discussed with her the different options for treatment and at this point she favors breast conservation bilaterally  with sentinel node biopsy on right. I have discussed with her the risks and benefits of the surgery as well as some of the technical aspects including the use of a radioactive seed for localization and she understands and wishes to proceed. She will get an MRI as well. This patient encounter took 60 minutes today to perform the following: take history, perform exam, review outside records, interpret imaging, counsel the patient on their diagnosis and document encounter, findings & plan in the EHR Current Plans Referred to Oncology, for evaluation and follow up (Oncology). Routine. Referred to Physical Therapy, for evaluation and follow up (Physical Therapy). Routine.

## 2020-07-26 NOTE — Op Note (Signed)
07/26/2020  10:24 AM  PATIENT:  [name]  65 y.o. female  PRE-OPERATIVE DIAGNOSIS:  LEFT BREAST DCIS x 2, RIGHT BREAST CANCER  POST-OPERATIVE DIAGNOSIS:  LEFT BREAST DCIS x 2, RIGHT BREAST CANCER  PROCEDURE:  Procedure(s): LEFT BREAST LUMPECTOMY X 2  WITH RADIOACTIVE SEED LOCALIZATION (Left) RIGHT BREAST LUMPECTOMY WITH RADIOACTIVE SEED LOCALIZATION AND DEEP RIGHT AXILLARY SENTINEL LYMPH NODE BIOPSY (Right)  SURGEON:  Surgeon(s) and Role:    * Jovita Kussmaul, MD - Primary  PHYSICIAN ASSISTANT:   ASSISTANTS: none   ANESTHESIA:   local and general  EBL:  20 mL   BLOOD ADMINISTERED:none  DRAINS: none   LOCAL MEDICATIONS USED:  MARCAINE     SPECIMEN:  Source of Specimen:  right breast tissue with sentinel nodes x 2, left breast tissue  DISPOSITION OF SPECIMEN:  PATHOLOGY  COUNTS:  YES  TOURNIQUET:  * No tourniquets in log *  DICTATION: .Dragon Dictation   After informed consent was obtained the patient was brought to the operating room and placed in the supine position on the operating table.  After adequate induction of general anesthesia the patient's bilateral chest, breast, and axillary areas were prepped with ChloraPrep, allowed to dry, and draped in usual sterile manner.  An appropriate timeout was performed.  Previously 2 I-125 seeds were placed in the left breast to mark areas of ductal carcinoma in situ and one seed was placed in the right breast to mark an area of invasive breast cancer.  Also earlier in the day the patient underwent injection of 1 mCi of technetium sulfur colloid in the subareolar position on the right.  Attention was first turned to the right breast.  The neoprobe was set to technetium and an area of radioactivity was readily identified in the right axilla.  This area was infiltrated with quarter percent Marcaine.  A small transversely oriented incision was made with a 15 blade knife overlying the area of radioactivity.  The incision was  carried through the skin and subcutaneous tissue sharply with the electrocautery until the deep right axillary space was entered.  The neoprobe was used to direct blunt hemostat dissection.  I was able to identify a hot lymph node.  It was excised sharply with the electrocautery and the surrounding small vessels and lymphatics were controlled with clips.  Ex vivo counts on this node were approximately 200.  A another palpable node was identified in the same area and it was also excised sharply with the electrocautery and the surrounding small vessels and lymphatics were controlled with clips.  There was no radioactivity in this node.  These 2 nodes were sent as sentinel nodes numbers 1 and 2.  There were no other hot or palpable nodes in the right axilla.  Hemostasis was achieved using the Bovie electrocautery.  The deep layer of the incision was closed with interrupted 3-0 Vicryl stitches.  The skin was closed with a running 4-0 Monocryl subcuticular stitch.  Attention was then turned to the right breast.  The neoprobe was set to I-125 in the area of radioactivity was readily identified in the upper portion of the right breast.  The area around this was infiltrated with quarter percent Marcaine.  A curvilinear incision was made along the upper edge of the areola.  The incision was carried through the skin and subcutaneous tissue sharply with the electrocautery.  Dissection was then carried towards the radioactive seed under the direction of the neoprobe.  Once I more closely approached  the radioactive seed I then removed a circular portion of breast tissue sharply with the electrocautery around the radioactive seed while checking the area of radioactivity frequently.  Once the specimen was removed it was oriented with the appropriate paint colors.  A specimen radiograph was obtained that showed the clip and seed to be near the center of the specimen.  The specimen was then sent to pathology for further evaluation.   Hemostasis was achieved using the Bovie electrocautery.  The wound was irrigated with saline and infiltrated with more quarter percent Marcaine.  The cavity was marked with clips.  The deep layer of the wound was then closed with interrupted layers of 3-0 Vicryl stitches.  The skin was then closed with interrupted 4-0 Monocryl subcuticular stitches.  Attention was then turned to the left breast.  The 2 radioactive seeds were identified in the area around this was infiltrated with quarter percent Marcaine.  A curvilinear incision was made along the upper edge of the areola of the left breast with a 15 blade knife.  The incision was carried through the skin and subcutaneous tissue sharply with the electrocautery.  Dissection was then carried around the 2 radioactive seeds while checking the area of radioactivity frequently.  Once the specimen was removed it was oriented with the appropriate paint colors.  A specimen radiograph was obtained that showed both of the seeds in both of the clips to be within the specimen.  The specimen was then sent to pathology for further evaluation.  Hemostasis was achieved using the Bovie electrocautery.  The wound was irrigated with saline and infiltrated with quarter percent Marcaine.  The cavity was marked with clips.  The deep layer of the wound was then closed with layers of interrupted 3-0 Vicryl stitches.  The skin was closed with interrupted 4-0 Monocryl subcuticular stitches.  Dermabond dressings were applied.  The patient tolerated the procedure well.  At the end of the case all needle sponge and instrument counts were correct.  The patient was then awakened and taken recovery in stable condition.  PLAN OF CARE: Discharge to home after PACU  PATIENT DISPOSITION:  PACU - hemodynamically stable.   Delay start of Pharmacological VTE agent (>24hrs) due to surgical blood loss or risk of bleeding: not applicable

## 2020-07-26 NOTE — Interval H&P Note (Signed)
History and Physical Interval Note:  07/26/2020 8:14 AM  [name]  has presented today for surgery, with the diagnosis of LEFT BREAST DCIS, RIGHT BREAST CANCER.  The various methods of treatment have been discussed with the patient and family. After consideration of risks, benefits and other options for treatment, the patient has consented to  Procedure(s) with comments: LEFT BREAST LUMPECTOMY X 2  WITH RADIOACTIVE SEED LOCALIZATION (Left) - PEC BLOCK RIGHT BREAST LUMPECTOMY WITH RADIOACTIVE SEED AND SENTINEL LYMPH NODE BIOPSY (Right) as a surgical intervention.  The patient's history has been reviewed, patient examined, no change in status, stable for surgery.  I have reviewed the patient's chart and labs.  Questions were answered to the patient's satisfaction.     Autumn Messing III

## 2020-07-26 NOTE — Anesthesia Procedure Notes (Signed)
Anesthesia Regional Block: Pectoralis block   Pre-Anesthetic Checklist: ,, timeout performed, Correct Patient, Correct Site, Correct Laterality, Correct Procedure, Correct Position, site marked, Risks and benefits discussed,  Surgical consent,  Pre-op evaluation,  At surgeon's request and post-op pain management  Laterality: Right  Prep: chloraprep       Needles:  Injection technique: Single-shot  Needle Type: Echogenic Needle     Needle Length: 9cm  Needle Gauge: 21     Additional Needles:   Procedures:,,,, ultrasound used (permanent image in chart),,,,  Narrative:  Start time: 07/26/2020 7:50 AM End time: 07/26/2020 8:00 AM Injection made incrementally with aspirations every 5 mL.  Performed by: Personally  Anesthesiologist: Catalina Gravel, MD  Additional Notes: No pain on injection. No increased resistance to injection. Injection made in 5cc increments.  Good needle visualization.  Patient tolerated procedure well.

## 2020-07-26 NOTE — Progress Notes (Signed)
Spoke with dr Gifford Shave re; pt states ' i'm no better" after additional ondansetron and sleeping x 30 minutes. Feels lightheaded and unresolving nausea/ no emesis.

## 2020-07-26 NOTE — Anesthesia Postprocedure Evaluation (Signed)
Anesthesia Post Note  Patient: [name]  Procedure(s) Performed: LEFT BREAST LUMPECTOMY X 2  WITH RADIOACTIVE SEED LOCALIZATION (Left Breast) RIGHT BREAST LUMPECTOMY WITH RADIOACTIVE SEED AND SENTINEL LYMPH NODE BIOPSY (Right Breast)     Patient location during evaluation: PACU Anesthesia Type: General Level of consciousness: awake and alert Pain management: pain level controlled Vital Signs Assessment: post-procedure vital signs reviewed and stable Respiratory status: spontaneous breathing, nonlabored ventilation, respiratory function stable and patient connected to nasal cannula oxygen Cardiovascular status: blood pressure returned to baseline and stable Postop Assessment: no apparent nausea or vomiting Anesthetic complications: no   No complications documented.  Last Vitals:  Vitals:   07/26/20 1115 07/26/20 1130  BP: 138/76 140/70  Pulse: 69 74  Resp: 11 13  Temp: (!) 36.1 C (!) 36.3 C  SpO2: 97% 95%    Last Pain:  Vitals:   07/26/20 1130  TempSrc:   PainSc: [place]

## 2020-07-27 ENCOUNTER — Encounter (HOSPITAL_COMMUNITY): Payer: Self-pay | Admitting: General Surgery

## 2020-07-30 LAB — SURGICAL PATHOLOGY

## 2020-07-31 ENCOUNTER — Encounter: Payer: Self-pay | Admitting: *Deleted

## 2020-07-31 ENCOUNTER — Telehealth: Payer: Self-pay | Admitting: *Deleted

## 2020-07-31 NOTE — Telephone Encounter (Signed)
Received order for mammaprint resting. Requisition sent to pathology and [facility]

## 2020-08-02 DIAGNOSIS — C50411 Malignant neoplasm of upper-outer quadrant of right female breast: Secondary | ICD-10-CM | POA: Diagnosis not present

## 2020-08-02 DIAGNOSIS — K760 Fatty (change of) liver, not elsewhere classified: Secondary | ICD-10-CM | POA: Diagnosis not present

## 2020-08-02 DIAGNOSIS — E039 Hypothyroidism, unspecified: Secondary | ICD-10-CM | POA: Diagnosis not present

## 2020-08-02 DIAGNOSIS — R5381 Other malaise: Secondary | ICD-10-CM | POA: Diagnosis not present

## 2020-08-06 ENCOUNTER — Other Ambulatory Visit: Payer: Self-pay | Admitting: Internal Medicine

## 2020-08-06 DIAGNOSIS — G4733 Obstructive sleep apnea (adult) (pediatric): Secondary | ICD-10-CM | POA: Diagnosis not present

## 2020-08-08 ENCOUNTER — Encounter (HOSPITAL_COMMUNITY): Payer: Self-pay

## 2020-08-08 DIAGNOSIS — F430 Acute stress reaction: Secondary | ICD-10-CM | POA: Diagnosis not present

## 2020-08-08 DIAGNOSIS — R69 Illness, unspecified: Secondary | ICD-10-CM | POA: Diagnosis not present

## 2020-08-09 ENCOUNTER — Encounter: Payer: Self-pay | Admitting: *Deleted

## 2020-08-09 ENCOUNTER — Other Ambulatory Visit: Payer: Self-pay

## 2020-08-09 ENCOUNTER — Telehealth: Payer: Self-pay | Admitting: *Deleted

## 2020-08-09 ENCOUNTER — Ambulatory Visit: Payer: Medicare HMO | Attending: General Surgery | Admitting: Physical Therapy

## 2020-08-09 ENCOUNTER — Encounter: Payer: Self-pay | Admitting: Physical Therapy

## 2020-08-09 DIAGNOSIS — C50411 Malignant neoplasm of upper-outer quadrant of right female breast: Secondary | ICD-10-CM | POA: Diagnosis not present

## 2020-08-09 DIAGNOSIS — D0512 Intraductal carcinoma in situ of left breast: Secondary | ICD-10-CM | POA: Diagnosis not present

## 2020-08-09 DIAGNOSIS — M25611 Stiffness of right shoulder, not elsewhere classified: Secondary | ICD-10-CM

## 2020-08-09 DIAGNOSIS — R600 Localized edema: Secondary | ICD-10-CM | POA: Diagnosis not present

## 2020-08-09 DIAGNOSIS — R293 Abnormal posture: Secondary | ICD-10-CM

## 2020-08-09 DIAGNOSIS — Z170 Estrogen receptor positive status [ER+]: Secondary | ICD-10-CM

## 2020-08-09 DIAGNOSIS — M25612 Stiffness of left shoulder, not elsewhere classified: Secondary | ICD-10-CM

## 2020-08-09 NOTE — Telephone Encounter (Signed)
Received call back from patient and she is aware of her appointment 4/1 at 1020.

## 2020-08-09 NOTE — Patient Instructions (Addendum)
Closed Chain: Shoulder Abduction / Adduction - on Wall    One hand on wall, step to side and return. Stepping causes shoulder to abduct and adduct. Step _5__ times, holding 5 seconds, _2__ times per day.  http://ss.exer.us/267   Copyright  VHI. All rights reserved.  Closed Chain: Shoulder Flexion / Extension - on Wall    Hands on wall, step backward. Return. Stepping causes shoulder flexion and extension Step _5__ times, holding 5 seconds, _2__ times per day.    http://ss.exer.us/265   Copyright  VHI. All rights reserved.               [facility]         [address], [place] [address]         [phone]         Annia Friendly, PT, CLT   After Breast Cancer Class It is recommended you attend the ABC class to be educated on lymphedema risk reduction. This class is free of charge and lasts for 1 hour. It is a 1-time class.  You are scheduled for April 4th at 11:00. We will send you a link through Webex. You need to download the webex app on your computer or cell phone or ipad.  Scar massage You can begin gentle scar massage after you see Dr. Marlou Starks. Use coconut oil a few minutes each day   Compression garment Continue wearing your compression bra until your swelling is gone. You were wrapped in a Coban @ lite compression wrap for your right lateral breast swelling. You can leave that on for a few days but take it off if it causes any discomfort. The purpose is to relieve discomfort and reduce swelling.   Home exercise Program Continue doing the home exercises given today until you feel like your full range of motion is back and you have no tightness.   Follow up PT: It is recommended you return every 3 months for the first 3 years following surgery to be assessed on the SOZO machine for an L-Dex score. This helps prevent clinically significant lymphedema in 95% of patients. These follow up screens are 15 minute  appointments that you are not billed for. You are scheduled for June 13th at 9:30 at [address].

## 2020-08-09 NOTE — Telephone Encounter (Signed)
Received mammaprint results of high risk. Left messages x2 to discuss with patient and give appointment with Dr. Burr Medico to discuss plan.

## 2020-08-09 NOTE — Therapy (Signed)
[place], [place], [address] Phone: [phone]   Fax:  [phone]  Physical Therapy Treatment  Patient Details  Name: [name] MRN: [id number] Date of Birth: Jul 02, 1955 Referring Provider (PT): Dr. Autumn Messing   Encounter Date: 08/09/2020   PT End of Session - 08/09/20 1338    Visit Number 2    Number of Visits 2    PT Start Time 1100    PT Stop Time 1210    PT Time Calculation (min) 70 min    Activity Tolerance Patient tolerated treatment well    Behavior During Therapy [facility] for tasks assessed/performed           Past Medical History:  Diagnosis Date  . Anal fissure   . Atrial septal aneurysm / if pfo  echo 6 13  10/23/2011  . Breast cancer ([place])   . Cataract    both eyes  . Depression   . Fatty liver    "pre fatty liver"  . Fibromyalgia   . Headache(784.0)    hx of migraines when younger  . Heart palpitations    hx with normal holter event monitoring  . Hx: UTI (urinary tract infection)   . Hyperlipidemia   . Hypertension   . Hypothyroidism   . Obesity   . Pneumonia 1972   hx of  . Polyp of colon   . 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 Skin cancer    basal, squamous cell  . Sleep apnea    uses cpap    Past Surgical History:  Procedure Laterality Date  . BREAST LUMPECTOMY WITH RADIOACTIVE SEED AND SENTINEL LYMPH NODE BIOPSY Right 07/26/2020   Procedure: RIGHT BREAST LUMPECTOMY WITH RADIOACTIVE SEED AND SENTINEL LYMPH NODE BIOPSY;  Surgeon: Jovita Kussmaul, MD;  Location: [facility];  Service: General;  Laterality: Right;  . BREAST LUMPECTOMY WITH RADIOACTIVE SEED LOCALIZATION Left 07/26/2020   Procedure: LEFT BREAST LUMPECTOMY X 2  WITH RADIOACTIVE SEED LOCALIZATION;  Surgeon: Jovita Kussmaul, MD;  Location: [facility];  Service: General;  Laterality: Left;  . COLONOSCOPY    . DILATION AND CURETTAGE OF UTERUS  1978  . [place]  . KNEE ARTHROSCOPY     both  in past  . POLYPECTOMY    . SHOULDER OPEN ROTATOR CUFF REPAIR Left 07/07/2013   Procedure: LEFT SHOULDER MINI OPEN SUBACROMIAL DECOMPRESSION ROTATOR CUFF REPAIR AND POSSIBLE PATCH GRAFT ;  Surgeon: Johnn Hai, MD;  Location: [facility];  Service: Orthopedics;  Laterality: Left;  with interscaline block  . SKIN CANCER EXCISION Bilateral    arm, legs, and chest  . TONSILLECTOMY      There were no vitals filed for this visit.   Subjective Assessment - 08/09/20 1108    Subjective Patient underwent a left lumpectomy for DCIS and a right lumpectomy and sentinel node biopsy on 07/26/2020. She had 2 lymph nodes removed with 1 being positive for cancer on the right side. She is waiting to hear back about the Mammaprint results but will likely undergo radiation. Seroma drained just prior to coming into PT today.    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 She underwent a left lumpectomy for DCIS and a right lumpectomy and sentinel node biopsy on 07/26/2020. She had 2 lymph nodes removed with 1 being positive for cancer on the right side. It is ER/PR positive. Left shoulder  rotator cuff repair 06/2013.    Patient Stated Goals See if my arm is doing ok    Currently in Pain? Yes    Pain Score 3     Pain Location Axilla    Pain Orientation Right    Pain Descriptors / Indicators Other (Comment);Stabbing   Itching   Pain Type Surgical pain    Pain Onset 1 to 4 weeks ago    Pain Frequency Intermittent    Aggravating Factors  Nothing    Pain Relieving Factors Nothing              [facility] PT Assessment - 08/09/20 0001      Assessment   Medical Diagnosis s/p bil lumpectomy and right SLNB    Referring Provider (PT) Dr. Autumn Messing    Onset Date/Surgical Date 07/26/20    Hand Dominance Right    Prior Therapy Baselines      Precautions   Precautions Other (comment)    Precaution Comments recent surgery      Restrictions    Weight Bearing Restrictions No      Balance Screen   Has the patient fallen in the past 6 months No    Has the patient had a decrease in activity level because of a fear of falling?  No    Is the patient reluctant to leave their home because of a fear of falling?  No      Home Ecologist residence    Living Arrangements Spouse/significant other    Available Help at Discharge Family      Prior Function   Level of [place] Retired    Leisure She does not exercise      Cognition   Overall Cognitive Status Within Functional Limits for tasks assessed      Observation/Other Assessments   Observations Pt had fluid drained from right lateral breast seroma prior to PT today. She has visible edema present in her lateral trunk and lateral breast but incisions bilaterally appear to be healing well.      Posture/Postural Control   Posture/Postural Control Postural limitations    Postural Limitations Rounded Shoulders;Forward head      ROM / Strength   AROM / PROM / Strength AROM      AROM   AROM Assessment Site Shoulder    Right/Left Shoulder Right;Left    Right Shoulder Extension 57 Degrees    Right Shoulder Flexion 145 Degrees    Right Shoulder ABduction 153 Degrees    Right Shoulder Internal Rotation 58 Degrees    Right Shoulder External Rotation 80 Degrees    Left Shoulder Extension 62 Degrees    Left Shoulder Flexion 143 Degrees    Left Shoulder ABduction 163 Degrees    Left Shoulder Internal Rotation 67 Degrees    Left Shoulder External Rotation 78 Degrees      Strength   Overall Strength Within functional limits for tasks performed             LYMPHEDEMA/ONCOLOGY QUESTIONNAIRE - 08/09/20 0001      Type   Cancer Type Bilateral breast cancer      Surgeries   Lumpectomy Date 07/26/20    Sentinel Lymph Node Biopsy Date 07/26/20    Number Lymph Nodes Removed 2   On right side     Treatment   Active  Chemotherapy Treatment No    Past Chemotherapy Treatment No    Active Radiation Treatment  No    Past Radiation Treatment No    Current Hormone Treatment No    Past Hormone Therapy No      What other symptoms do you have   Are you Having Heaviness or Tightness Yes    Are you having Pain Yes    Are you having pitting edema No    Is it Hard or Difficult finding clothes that fit No    Do you have infections No    Is there Decreased scar mobility Yes    [place]mer Sign No      Lymphedema Assessments   Lymphedema Assessments Upper extremities      Right Upper Extremity Lymphedema   10 cm Proximal to Olecranon Process 32.7 cm    Olecranon Process 27 cm    10 cm Proximal to Ulnar Styloid Process 25 cm    Just Proximal to Ulnar Styloid Process 17.9 cm    Across Hand at [employer] 19.1 cm    At [place] of 2nd Digit 6.4 cm      Left Upper Extremity Lymphedema   10 cm Proximal to Olecranon Process 32.4 cm    Olecranon Process 27 cm    10 cm Proximal to Ulnar Styloid Process 24.2 cm    Just Proximal to Ulnar Styloid Process 17.3 cm    Across Hand at [employer] 19 cm    At [place] of 2nd Digit 6.2 cm              Quick Dash - 08/09/20 0001    Open a tight or new jar No difficulty    Do heavy household chores (wash walls, wash floors) Mild difficulty    Carry a shopping bag or briefcase No difficulty    Wash your back No difficulty    Use a knife to cut food No difficulty    Recreational activities in which you take some force or impact through your arm, shoulder, or hand (golf, hammering, tennis) Unable    During the past week, to what extent has your arm, shoulder or hand problem interfered with your normal social activities with family, friends, neighbors, or groups? Modererately    During the past week, to what extent has your arm, shoulder or hand problem limited your work or other regular daily activities Slightly    Arm, shoulder, or hand pain. Moderate    Tingling (pins  and needles) in your arm, shoulder, or hand None    Difficulty Sleeping Mild difficulty    DASH Score 25 %                  [facility] Adult PT Treatment/Exercise - 08/09/20 0001      Manual Therapy   Manual therapy comments Applied Coban Lite compression wrap (2 layers) to right breast focused on lateral aspect but had to wrap across left shoulder to hold bandage in place with hopes of reducing edema. Pt came into PT today with an ace wrap around the top of her trunk from the surgeon's office (under axillae) which has rolled up and appeared to be moving fluid above and below the wrap. This was removed to assess the breast and edema.                  PT Education - 08/09/20 1212    Education Details Lymphedema education, aftercare; HEP    Person(s) Educated Patient    Methods Explanation;Demonstration;Handout    Comprehension Returned demonstration;Verbalized understanding  PT Long Term Goals - 08/09/20 1346      PT LONG TERM GOAL #1   Title Patient will demonstrate she has regained full shoulder ROM and function post operatively compared with baselines.    Time 8    Period Weeks    Status Partially Met                 Plan - 08/09/20 1339    Clinical Impression Statement Patient is healing well s/p bilateral lumpectomies and right sentinel node biopsy on 07/26/2020. She had 2 axillary nodes removed on the right side, with 1 being positive for invasive carcinoma. She developed a seroma on the right side which was drained prior to PT this morning at the surgeon's office. She reports she is awaiting Mammaprint results from her physician and although those are in her chart, PT did not share those results. She has regained nearly full shoulder ROM bilaterally and was given 2 new exercises to regain the last few degrees of flexion and abduction. She plans to attend the After Breast Cancer class but otherwise has no PT needs at this time. She was instructed  to keep her wrap on for 2-3 days unless she began to have new symptoms or pain worsened and she will see her surgeon 08/13/2020.    PT Treatment/Interventions ADLs/Self Care Home Management;Therapeutic exercise;Patient/family education;Manual techniques    PT Next Visit Plan D/C    PT Home Exercise Plan Closed chain shoulder flexion and abduction    Consulted and Agree with Plan of Care Patient           Patient will benefit from skilled therapeutic intervention in order to improve the following deficits and impairments:  Decreased range of motion,Decreased knowledge of precautions,Pain,Impaired UE functional use,Increased edema  Visit Diagnosis: Malignant neoplasm of upper-outer quadrant of right breast in female, estrogen receptor positive ([place])  Ductal carcinoma in situ (DCIS) of left breast  Abnormal posture  Stiffness of left shoulder, not elsewhere classified  Stiffness of right shoulder, not elsewhere classified  Localized edema     Problem List Patient Active Problem List   Diagnosis Date Noted  . Malignant neoplasm of upper-outer quadrant of right breast in female, estrogen receptor positive ([place]) 06/25/2020  . 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 Hepatic steatosis 11/27/2014  . Hx of adenomatous colonic polyps 11/27/2014  . Visit for preventive health examination 03/28/2014  . Hyperlipidemia 03/28/2014  . Left rotator cuff tear 07/07/2013  . Tear of left rotator cuff 07/07/2013  . Rhinitis 06/01/2013  . Chest wall deformity 04/12/2012  . Atrial septal aneurysm / if pfo  echo 6 13  10/23/2011  . OSA (obstructive sleep apnea) 11/27/2010  . Dyslipidemia 11/01/2010  . [place] 08/29/2010  . Labile hypertension 08/29/2010  . MUSCLE CRAMPS, FOOT 06/10/2010  . OTHER SLEEP DISTURBANCES 06/10/2010  . ANXIETY, SITUATIONAL 05/08/2010  . VERTIGO, POSITIONAL 03/15/2010  . VITAMIN D DEFICIENCY 02/18/2010  . OBESITY 02/18/2010  . ALLERGIC RHINITIS  02/18/2010  . PLANTAR FASCIITIS 02/18/2010  . TWITCHING 06/11/2009  . NUMBNESS, HAND 06/11/2009  . CERVICAL STRAIN, ACUTE 06/11/2009  . OTHER MALAISE AND FATIGUE 07/09/2007  . HYPERTENSION 01/28/2007  . Hypothyroidism 11/11/2006  . COMMON MIGRAINE 11/11/2006  . GERD 11/11/2006  . FIBROMYALGIA 11/11/2006   PHYSICAL THERAPY DISCHARGE SUMMARY  Visits from Start of Care: 2  Current functional level related to goals / functional outcomes: Goals met except slightly limited shoulder shoulder flexion and abduction.  Remaining deficits: Slight ROM limitation bilateral shoulders.   Education / Equipment: HEP Plan: Patient agrees to discharge.  Patient goals were partially met. Patient is being discharged due to being pleased with the current functional level.  ?????         Annia Friendly, [place] 08/09/20 1:48 PM  [place], [place], [address] Phone: [phone]   Fax:  [phone]  Name: [name] MRN: [id number] Date of Birth: 01/28/1956

## 2020-08-09 NOTE — Telephone Encounter (Signed)
Received mammaprint results of high risk.  Left voicemail to return my call to discuss results.

## 2020-08-10 ENCOUNTER — Encounter: Payer: Self-pay | Admitting: Hematology

## 2020-08-10 ENCOUNTER — Encounter: Payer: Self-pay | Admitting: *Deleted

## 2020-08-10 ENCOUNTER — Inpatient Hospital Stay: Payer: Medicare HMO | Attending: Hematology | Admitting: Hematology

## 2020-08-10 VITALS — BP 140/68 | HR 88 | Temp 97.5°F | Resp 18 | Ht 66.0 in | Wt 206.1 lb

## 2020-08-10 DIAGNOSIS — E669 Obesity, unspecified: Secondary | ICD-10-CM | POA: Insufficient documentation

## 2020-08-10 DIAGNOSIS — C50411 Malignant neoplasm of upper-outer quadrant of right female breast: Secondary | ICD-10-CM

## 2020-08-10 DIAGNOSIS — Z8701 Personal history of pneumonia (recurrent): Secondary | ICD-10-CM | POA: Diagnosis not present

## 2020-08-10 DIAGNOSIS — E039 Hypothyroidism, unspecified: Secondary | ICD-10-CM | POA: Insufficient documentation

## 2020-08-10 DIAGNOSIS — Z170 Estrogen receptor positive status [ER+]: Secondary | ICD-10-CM | POA: Diagnosis not present

## 2020-08-10 DIAGNOSIS — G4730 Sleep apnea, unspecified: Secondary | ICD-10-CM | POA: Insufficient documentation

## 2020-08-10 DIAGNOSIS — E785 Hyperlipidemia, unspecified: Secondary | ICD-10-CM | POA: Insufficient documentation

## 2020-08-10 DIAGNOSIS — I10 Essential (primary) hypertension: Secondary | ICD-10-CM | POA: Diagnosis not present

## 2020-08-10 DIAGNOSIS — Z5189 Encounter for other specified aftercare: Secondary | ICD-10-CM | POA: Diagnosis not present

## 2020-08-10 DIAGNOSIS — D0512 Intraductal carcinoma in situ of left breast: Secondary | ICD-10-CM | POA: Diagnosis not present

## 2020-08-10 DIAGNOSIS — M797 Fibromyalgia: Secondary | ICD-10-CM | POA: Diagnosis not present

## 2020-08-10 DIAGNOSIS — Z5111 Encounter for antineoplastic chemotherapy: Secondary | ICD-10-CM | POA: Insufficient documentation

## 2020-08-10 DIAGNOSIS — F329 Major depressive disorder, single episode, unspecified: Secondary | ICD-10-CM | POA: Diagnosis not present

## 2020-08-10 NOTE — Progress Notes (Signed)
START ON PATHWAY REGIMEN - Breast     A cycle is every 21 days:     Docetaxel      Cyclophosphamide   **Always confirm dose/schedule in your pharmacy ordering system**  Patient Characteristics: Postoperative without Neoadjuvant Therapy (Pathologic Staging), Invasive Disease, Adjuvant Therapy, HER2 Negative/Unknown/Equivocal, ER Positive, Node Positive, Node Positive (1-3), MammaPrint(R) Ordered, High Genomic Risk Therapeutic Status: Postoperative without Neoadjuvant Therapy (Pathologic Staging) AJCC Grade: G2 AJCC N Category: pN1 AJCC M Category: cM0 ER Status: Positive (+) AJCC 8 Stage Grouping: IA HER2 Status: Negative (-) Oncotype Dx Recurrence Score: Ordered Other Genomic Test AJCC T Category: pT1c PR Status: Positive (+) Adjuvant Therapy Status: No Adjuvant Therapy Received Yet or Changing Initial Adjuvant Regimen due to Tolerance Has this patient completed genomic testing<= Yes - MammaPrint(R) MammaPrint(R) Score: High Genomic Risk Intent of Therapy: Curative Intent, Discussed with Patient

## 2020-08-10 NOTE — Progress Notes (Signed)
[name]   [phone] [phone]   Clinic Follow up Note   Patient Care Team: Panosh, Standley Brooking, MD as PCP - General Fay Records, MD as PCP - Cardiology (Cardiology) Clance, Armando Reichert, MD (Pulmonary Disease) Arvella Nigh, MD as Consulting Physician (Obstetrics and Gynecology) Dr Peggye Form, MD as Consulting Physician (Orthopedic Surgery) Ladene Artist, MD as Consulting Physician (Gastroenterology) Melina Schools, MD as Consulting Physician (Orthopedic Surgery) Rockwell Germany, RN as Oncology Nurse Navigator Mauro Kaufmann, RN as Oncology Nurse Navigator Jovita Kussmaul, MD as Consulting Physician (General Surgery) Truitt Merle, MD as Consulting Physician (Hematology) Eppie Gibson, MD as Attending Physician (Radiation Oncology)  Date of Service:  08/10/2020  CHIEF COMPLAINT: F/u of Left breast DCIS and Right breast cancer   SUMMARY OF ONCOLOGIC HISTORY: Oncology History Overview Note  Cancer Staging Ductal carcinoma in situ (DCIS) of left breast Staging form: Breast, AJCC 8th Edition - Clinical stage from 06/21/2020: Stage 0 (cTis (DCIS), cN0, cM0, G2, ER+, PR+, HER2: Not Assessed) - Signed by Truitt Merle, MD on 06/26/2020 Stage prefix: Initial diagnosis  Malignant neoplasm of upper-outer quadrant of right breast in female, estrogen receptor positive ([place]) Staging form: Breast, AJCC 8th Edition - Clinical stage from 06/21/2020: Stage IA (cT1b, cN0, cM0, G2, ER+, PR+, HER2-) - Signed by Truitt Merle, MD on 06/26/2020 Stage prefix: Initial diagnosis - Pathologic stage from 07/26/2020: Stage IA (pT1c, pN1a, cM0, G2, ER+, PR+, HER2-) - Signed by Truitt Merle, MD on 08/09/2020 Stage prefix: Initial diagnosis Nuclear grade: G2 Histologic grading system: 3 grade system Residual tumor (R): R0 - None    Malignant neoplasm of upper-outer quadrant of right breast in female, estrogen receptor positive ([place])  06/08/2020 Mammogram   IMPRESSION: 1. 9 x 7 x  6 mm mass in the 12 o'clock position of the right breast, 2cmfn with imaging features highly suspicious for malignancy. 2. 4 mm group of indeterminate calcifications in the 12 o'clock position of the left breast and 4 mm group of indeterminate calcifications in the 1 o'clock position of the left breast. Together, the groups span an area measuring 3.9 cm.   06/21/2020 Cancer Staging   Staging form: Breast, AJCC 8th Edition - Clinical stage from 06/21/2020: Stage IA (cT1b, cN0, cM0, G2, ER+, PR+, HER2-) - Signed by Truitt Merle, MD on 06/26/2020 Stage prefix: Initial diagnosis   06/21/2020 Initial Biopsy   Diagnosis 1. Breast, right, needle core biopsy, 12 oc - INVASIVE MAMMARY CARCINOMA - MAMMARY CARCINOMA IN SITU - SEE COMMENT 2. Breast, left, needle core biopsy, 12 oc - MAMMARY CARCINOMA IN-SITU WITH NECROSIS AND CALCIFICATIONS - SEE COMMENT 3. Breast, left, needle core biopsy, 1 oc - MAMMARY CARCINOMA IN-SITU WITH NECROSIS AND CALCIFICATIONS - SEE COMMENT Microscopic Comment 1. The biopsy material shows an infiltrative proliferation of cells with large vesicular nuclei with inconspicuous nucleoli, arranged linearly and in small clusters. Based on the biopsy, the carcinoma appears Nottingham grade 2 of 3 and measures 0.8 cm in greatest linear extent. E-cadherin and prognostic markers (ER/PR/ki-67/HER2)are pending and will be reported in an addendum. Dr. Jeannie Done reviewed the case and agrees with the above diagnosis. These results were called to [facility] on June 22, 2020. 2. and 3. E-cadherin is pending and will be reported in an addendum.     1. E-cadherin is POSITIVE supporting a ductal origin. 2. E-cadherin is POSITIVE supporting a ductal origin. A prognostic panel (ER/PR) is pending and  will be reported in an addendum 3. E-cadherin is positive supporting a ductal origin. The focus is less pronounced on the deeper sections and in isolation would likely be  considered atypical ductal hyperplasia.   06/21/2020 Receptors her2   1. PROGNOSTIC INDICATORS Results: IMMUNOHISTOCHEMICAL AND MORPHOMETRIC ANALYSIS PERFORMED MANUALLY The tumor cells are EQUIVOCAL for Her2 (2+). HER2 by FISH will be performed and the results reported separately Estrogen Receptor: 95%, POSITIVE, STRONG STAINING INTENSITY Progesterone Receptor: 40%, POSITIVE, MODERATE STAINING INTENSITY Proliferation Marker Ki67: 10%  1. FLUORESCENCE IN-SITU HYBRIDIZATION Results: GROUP 5: HER2 **NEGATIVE** Equivocal form of amplification of the HER2 gene was detected in the IHC 2+ tissue sample received from this individual. HER2 FISH was performed by a technologist and cell imaging and analysis on the [employer].   06/25/2020 Initial Diagnosis   Malignant neoplasm of upper-outer quadrant of right breast in female, estrogen receptor positive ([place])   07/03/2020 Breast MRI   IMPRESSION: 1. Known RIGHT breast cancer, 12 o'clock axis, at anterior depth, measuring 1 cm greatest extent, manifesting as a spiculated enhancing mass on MRI, with associated biopsy clip. Expected post biopsy changes are seen within the adjacent outer RIGHT breast. 2. No evidence of additional multifocal or multicentric disease within the RIGHT breast. 3. Known LEFT breast DCIS within the slightly outer LEFT breast, at anterior depth, corresponding to the biopsy site labeled 1 o'clock axis, with associated enhancement only at the margins of the biopsy cavity measuring up to 5 mm greatest dimension. 4. Known LEFT breast DCIS within the upper central LEFT breast, at middle depth, corresponding to the biopsy site labeled 12 o'clock axis. Contiguous linear non-mass enhancement extends 2.3 cm superior-medial to the biopsy cavity, most likely post biopsy change but possibly contiguous extent of disease. 5. No evidence of additional multifocal or multicentric disease within the LEFT breast. 6. No evidence of  metastatic lymphadenopathy.     07/26/2020 Cancer Staging   Staging form: Breast, AJCC 8th Edition - Pathologic stage from 07/26/2020: Stage IA (pT1c, pN1a, cM0, G2, ER+, PR+, HER2-) - Signed by Truitt Merle, MD on 08/09/2020 Stage prefix: Initial diagnosis Nuclear grade: G2 Histologic grading system: 3 grade system Residual tumor (R): R0 - None   07/26/2020 Surgery   RIGHT BREAST LUMPECTOMY WITH RADIOACTIVE SEED AND SENTINEL LYMPH NODE BIOPSY by Dr Marlou Starks   07/26/2020 Pathology Results   FINAL MICROSCOPIC DIAGNOSIS:   A. LYMPH NODE, RIGHT AXILLARY #1, SENTINEL, EXCISION:  - Lymph node, negative for carcinoma (0/1)   B. LYMPH NODE, RIGHT AXILLARY, SENTINEL, EXCISION:  - Benign fibroadipose tissue, negative for carcinoma   C. BREAST, RIGHT, LUMPECTOMY:  - Invasive ductal carcinoma, 1.5 cm, grade 2  - Ductal carcinoma in situ, low grade  - Resection margins are negative for carcinoma; closest is the anterior  margin of 0.2 cm  - Biopsy site changes  - See oncology table   D. BREAST, LEFT, LUMPECTOMY:  - Benign breast parenchyma with prominent biopsy-related changes  - Negative for residual ductal carcinoma in situ  - See oncology table   E. LYMPH NODE, RIGHT AXILLARY #2, SENTINEL, EXCISION:  - Invasive ductal carcinoma, see comment     COMMENT:   E.  Lymph node tissue is not identified.  Findings likely represent an  entirely replaced lymph node with foci of extranodal extension.    07/26/2020 Miscellaneous   Mammaprint High Risk of Luminla Type B 29% risk of recurrence in 10 years if untreated.  Her Mammaprint index is -0.175 She has  94.6% benefit of chemotherapy and hormaonal therapy.      08/31/2020 -  Chemotherapy    Patient is on Treatment Plan: BREAST TC Q21D      Ductal carcinoma in situ (DCIS) of left breast  06/08/2020 Mammogram   IMPRESSION: 1. 9 x 7 x 6 mm mass in the 12 o'clock position of the right breast, 2cmfn with imaging features highly suspicious for  malignancy. 2. 4 mm group of indeterminate calcifications in the 12 o'clock position of the left breast and 4 mm group of indeterminate calcifications in the 1 o'clock position of the left breast. Together, the groups span an area measuring 3.9 cm.   06/21/2020 Cancer Staging   Staging form: Breast, AJCC 8th Edition - Clinical stage from 06/21/2020: Stage 0 (cTis (DCIS), cN0, cM0, G2, ER+, PR+, HER2: Not Assessed) - Signed by Truitt Merle, MD on 06/26/2020 Stage prefix: Initial diagnosis   06/21/2020 Initial Biopsy   Diagnosis 1. Breast, right, needle core biopsy, 12 oc - INVASIVE MAMMARY CARCINOMA - MAMMARY CARCINOMA IN SITU - SEE COMMENT 2. Breast, left, needle core biopsy, 12 oc - MAMMARY CARCINOMA IN-SITU WITH NECROSIS AND CALCIFICATIONS - SEE COMMENT 3. Breast, left, needle core biopsy, 1 oc - MAMMARY CARCINOMA IN-SITU WITH NECROSIS AND CALCIFICATIONS - SEE COMMENT Microscopic Comment 1. The biopsy material shows an infiltrative proliferation of cells with large vesicular nuclei with inconspicuous nucleoli, arranged linearly and in small clusters. Based on the biopsy, the carcinoma appears Nottingham grade 2 of 3 and measures 0.8 cm in greatest linear extent. E-cadherin and prognostic markers (ER/PR/ki-67/HER2)are pending and will be reported in an addendum. Dr. Jeannie Done reviewed the case and agrees with the above diagnosis. These results were called to [facility] on June 22, 2020. 2. and 3. E-cadherin is pending and will be reported in an addendum.     1. E-cadherin is POSITIVE supporting a ductal origin. 2. E-cadherin is POSITIVE supporting a ductal origin. A prognostic panel (ER/PR) is pending and will be reported in an addendum 3. E-cadherin is positive supporting a ductal origin. The focus is less pronounced on the deeper sections and in isolation would likely be considered atypical ductal hyperplasia.   06/25/2020 Initial Diagnosis   Ductal carcinoma  in situ (DCIS) of left breast    Receptors her2   2. PROGNOSTIC INDICATORS Results: IMMUNOHISTOCHEMICAL AND MORPHOMETRIC ANALYSIS PERFORMED MANUALLY Estrogen Receptor: 95%, POSITIVE, STRONG STAINING INTENSITY Progesterone Receptor: 30%, POSITIVE, STRONG STAINING INTENSITY   07/03/2020 Breast MRI   IMPRESSION: 1. Known RIGHT breast cancer, 12 o'clock axis, at anterior depth, measuring 1 cm greatest extent, manifesting as a spiculated enhancing mass on MRI, with associated biopsy clip. Expected post biopsy changes are seen within the adjacent outer RIGHT breast. 2. No evidence of additional multifocal or multicentric disease within the RIGHT breast. 3. Known LEFT breast DCIS within the slightly outer LEFT breast, at anterior depth, corresponding to the biopsy site labeled 1 o'clock axis, with associated enhancement only at the margins of the biopsy cavity measuring up to 5 mm greatest dimension. 4. Known LEFT breast DCIS within the upper central LEFT breast, at middle depth, corresponding to the biopsy site labeled 12 o'clock axis. Contiguous linear non-mass enhancement extends 2.3 cm superior-medial to the biopsy cavity, most likely post biopsy change but possibly contiguous extent of disease. 5. No evidence of additional multifocal or multicentric disease within the LEFT breast. 6. No evidence of metastatic lymphadenopathy.     07/26/2020 Surgery   LEFT  BREAST LUMPECTOMY X 2  WITH RADIOACTIVE SEED LOCALIZATION by Dr Marlou Starks   07/26/2020 Pathology Results   FINAL MICROSCOPIC DIAGNOSIS:   A. LYMPH NODE, RIGHT AXILLARY #1, SENTINEL, EXCISION:  - Lymph node, negative for carcinoma (0/1)   B. LYMPH NODE, RIGHT AXILLARY, SENTINEL, EXCISION:  - Benign fibroadipose tissue, negative for carcinoma   C. BREAST, RIGHT, LUMPECTOMY:  - Invasive ductal carcinoma, 1.5 cm, grade 2  - Ductal carcinoma in situ, low grade  - Resection margins are negative for carcinoma; closest is the anterior   margin of 0.2 cm  - Biopsy site changes  - See oncology table   D. BREAST, LEFT, LUMPECTOMY:  - Benign breast parenchyma with prominent biopsy-related changes  - Negative for residual ductal carcinoma in situ  - See oncology table   E. LYMPH NODE, RIGHT AXILLARY #2, SENTINEL, EXCISION:  - Invasive ductal carcinoma, see comment     COMMENT:   E.  Lymph node tissue is not identified.  Findings likely represent an  entirely replaced lymph node with foci of extranodal extension.       CURRENT THERAPY:  PENDING Adjuvant Chemo  INTERVAL HISTORY:  Miette Molenda is here for a follow up. She presents to the clinic with her husband. She notes she does not remember much surgery recover due to fentanyl. She notes she had 60cc of fluid removed yesterday and some fluid is going into her breast. She has started PT.  She notes apprehension of chemo due to her fatty liver and her hair loss. 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 She is concerned with Adriamycin effecting this. She plans to discuss chemo with her liver doctor.  She notes her children planned trip to beach with her on May 6-12.    REVIEW OF SYSTEMS:   Constitutional: Denies fevers, chills or abnormal weight loss Eyes: Denies blurriness of vision Ears, nose, mouth, throat, and face: Denies mucositis or sore throat Respiratory: Denies cough, dyspnea or wheezes Cardiovascular: Denies palpitation, chest discomfort or lower extremity swelling Gastrointestinal:  Denies nausea, heartburn or change in bowel habits Skin: Denies abnormal skin rashes Lymphatics: Denies new lymphadenopathy or easy bruising Neurological:Denies numbness, tingling or new weaknesses Behavioral/Psych: Mood is stable, no new changes  All other systems were reviewed with the patient and are negative.  MEDICAL HISTORY:  Past Medical History:  Diagnosis Date  . Anal fissure   . Atrial septal aneurysm / if pfo  echo 6  13  10/23/2011  . Breast cancer ([place])   . Cataract    both eyes  . Depression   . Fatty liver    "pre fatty liver"  . Fibromyalgia   . Headache(784.0)    hx of migraines when younger  . Heart palpitations    hx with normal holter event monitoring  . Hx: UTI (urinary tract infection)   . Hyperlipidemia   . Hypertension   . Hypothyroidism   . Obesity   . Pneumonia 1972   hx of  . Polyp of colon   . 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 Skin cancer    basal, squamous cell  . Sleep apnea    uses cpap    SURGICAL HISTORY: Past Surgical History:  Procedure Laterality Date  . BREAST LUMPECTOMY WITH RADIOACTIVE SEED AND SENTINEL LYMPH NODE BIOPSY Right 07/26/2020   Procedure: RIGHT BREAST LUMPECTOMY WITH RADIOACTIVE SEED AND SENTINEL LYMPH NODE BIOPSY;  Surgeon: Jovita Kussmaul, MD;  Location:  [facility];  Service: General;  Laterality: Right;  . BREAST LUMPECTOMY WITH RADIOACTIVE SEED LOCALIZATION Left 07/26/2020   Procedure: LEFT BREAST LUMPECTOMY X 2  WITH RADIOACTIVE SEED LOCALIZATION;  Surgeon: Jovita Kussmaul, MD;  Location: [facility];  Service: General;  Laterality: Left;  . COLONOSCOPY    . DILATION AND CURETTAGE OF UTERUS  1978  . [place]  . KNEE ARTHROSCOPY     both in past  . POLYPECTOMY    . SHOULDER OPEN ROTATOR CUFF REPAIR Left 07/07/2013   Procedure: LEFT SHOULDER MINI OPEN SUBACROMIAL DECOMPRESSION ROTATOR CUFF REPAIR AND POSSIBLE PATCH GRAFT ;  Surgeon: Johnn Hai, MD;  Location: [facility];  Service: Orthopedics;  Laterality: Left;  with interscaline block  . SKIN CANCER EXCISION Bilateral    arm, legs, and chest  . TONSILLECTOMY      I have reviewed the social history and family history with the patient and they are unchanged from previous note.  ALLERGIES:  is allergic to penicillins, cefdinir, norvasc [amlodipine besylate], cetirizine, irbesartan, molds & smuts, and tylenol [acetaminophen].  MEDICATIONS:  Current Outpatient Medications   Medication Sig Dispense Refill  . Ascorbic Acid (VITAMIN C) 1000 MG tablet Take 1,000 mg by mouth daily.    Marland Kitchen b complex vitamins capsule Take 1 capsule by mouth daily.    . Bioflavonoid Products (QUERCETIN COMPLEX IMMUNE PO) Take 1 capsule by mouth daily.    . Cholecalciferol (VITAMIN D) 50 MCG (2000 UT) tablet Take 4,000 Units by mouth daily.    . hydrochlorothiazide (HYDRODIURIL) 25 MG tablet TAKE 1 TABLET BY MOUTH EVERY DAY (Patient taking differently: Take 25 mg by mouth daily.) 90 tablet 0  . hydroxychloroquine (PLAQUENIL) 200 MG tablet Take 200 mg by mouth 2 (two) times a week.    Marland Kitchen LORazepam (ATIVAN) 1 MG tablet Take 0.5-1 tablets (0.5-1 mg total) by mouth every 8 (eight) hours as needed for anxiety. anxiety 20 tablet 0  . MAGNESIUM CARBONATE PO Take 1 tablet by mouth daily.    . melatonin 5 MG TABS Take 5 mg by mouth at bedtime.    . Menaquinone-7 (VITAMIN K2) 100 MCG CAPS Take 100 mcg by mouth daily.    . metoprolol succinate (TOPROL XL) 25 MG 24 hr tablet TAKE ONCE A DAY AS DIRECTED (Patient taking differently: Take by mouth daily as needed (Blood pressure). TAKE ONCE A DAY AS DIRECTED) 30 tablet 1  . Multiple Vitamin (MULTIVITAMIN) tablet Take 1 tablet by mouth daily.    . NON FORMULARY Take 48.6 mg by mouth See admin instructions. Thyroid desiccated (procine) sr 2 capsules 5 days a week, 3 capsules 2 days per week (Tues and Fri)    . OLIVE LEAF EXTRACT PO Take 1 capsule by mouth daily.    Marland Kitchen OVER THE COUNTER MEDICATION Take 2 capsules by mouth in the morning and at bedtime. Ifnla Med    . OVER THE COUNTER MEDICATION Take 1 capsule by mouth daily. Essential Pro    . OVER THE COUNTER MEDICATION Take 1 capsule by mouth daily. 5 HTP    . potassium chloride SA (KLOR-CON) 20 MEQ tablet TAKE ONE TABLET BY MOUTH ONE TIME DAILY 90 tablet 0  . Probiotic Product (PROBIOTIC PO) Take 1 capsule by mouth daily.    . traMADol (ULTRAM) 50 MG tablet Take 1-2 tablets (50-100 mg total) by mouth every 6  (six) hours as needed. (Patient not taking: Reported on 08/09/2020) 20 tablet 1  .  VITAMIN E PO Take 23 mg by mouth daily.    Marland Kitchen ZINC GLUCONATE PO Take 10 mg by mouth daily.     No current facility-administered medications for this visit.    PHYSICAL EXAMINATION: ECOG PERFORMANCE STATUS: 1 - Symptomatic but completely ambulatory  Vitals:   08/10/20 1023  BP: 140/68  Pulse: 88  Resp: 18  Temp: (!) 97.5 F (36.4 C)  SpO2: 99%   Filed Weights   08/10/20 1023  Weight: 206 lb 1.6 oz (93.5 kg)    GENERAL:alert, no distress and comfortable SKIN: skin color, texture, turgor are normal, no rashes or significant lesions EYES: normal, Conjunctiva are pink and non-injected, sclera clear  NECK: supple, thyroid normal size, non-tender, without nodularity LYMPH:  no palpable lymphadenopathy in the cervical, axillary  LUNGS: clear to auscultation and percussion with normal breathing effort HEART: regular rate & rhythm and no murmurs and no lower extremity edema ABDOMEN:abdomen soft, non-tender and normal bowel sounds Musculoskeletal:no cyanosis of digits and no clubbing  NEURO: alert & oriented x 3 with fluent speech, no focal motor/sensory deficits BREAST: S/p B/l lumpectomy: Surgical incisions healed well (+) Mild seroma at right axilla. No palpable mass, nodules or adenopathy bilaterally. Breast exam benign.   LABORATORY DATA:  I have reviewed the data as listed CBC Latest Ref Rng & Units 07/20/2020 06/27/2020 06/12/2018  WBC 4.0 - 10.5 K/uL 8.8 8.3 9.1  Hemoglobin 12.0 - 15.0 g/dL 13.6 13.9 13.1  Hematocrit 36.0 - 46.0 % 41.7 41.6 41.1  Platelets 150 - 400 K/uL 207 245 211     CMP Latest Ref Rng & Units 07/20/2020 06/27/2020 06/05/2020  Glucose 70 - 99 mg/dL 92 93 -  BUN 8 - 23 mg/dL _0 Creatinine 0.44 - 1.00 mg/dL 0.76 0.88 0.7  Sodium 135 - 145 mmol/L 140 142 -  Potassium 3.5 - 5.1 mmol/L 3.3(L) 3.9 -  Chloride 98 - 111 mmol/L 105 102 -  CO2 22 - 32 mmol/L 27 29 -  Calcium  8.9 - 10.3 mg/dL 9.4 9.5 8.9  Total Protein 6.5 - 8.1 g/dL 6.9 7.6 -  Total Bilirubin 0.3 - 1.2 mg/dL 0.8 0.8 -  Alkaline Phos 38 - 126 U/L 106 113 -  AST 15 - 41 U/L 35 49(H) 26  ALT 0 - 44 U/L 54(H) 69(H) 27      RADIOGRAPHIC STUDIES: I have personally reviewed the radiological images as listed and agreed with the findings in the report. No results found.   ASSESSMENT & PLAN:  [name] is a 65 y.o. female with    1. 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 She was found to have b/l masses on mammogram. On biopsy she was seen to have 2 small area of  DCIS in left breast and 0.8cm right breast invasive ductal carcinoma. -She underwent B/l lumpectomy and right SNLB by Dr Marlou Starks on 07/26/20. Her surgical path showed her left breast DCIS was completely removed with biopsy and no residual DCIS on path. Her grade II, 1.5cm invasive ductal carcinoma of right breast was completely removed with clear margins and 1/2 positive LNs. I discussed path with patient in great detail today.  -Given positive LN, Mammaprint was done and results show high risk of Luminal Type B with 29% risk of distant recurrence. She has 94.6% chance of 5-year recurrence free with chemotherapy  and hormonal therapy. I discussed with patient today in detail.  -I discussed with the high risk disease, I recommend CT CAP and bone scan to evaluate for distant metastasis. She is agreeable.  -Given her high risk of cancer I recommend adjuvant chemotherapy to reduce her risk. I reviewed standard regimen includes more intensive dose dense Adriamycin and Cytoxan q2weeks for 4 cycles followed by Taxol weekly for 12 weeks or moderately intensive Docetaxel and Cytoxan q3weeks for 4 cycles. I discussed PAC is recommended for first regimen and there is potential risk of heart failure.  I gave her print out of the above chemo medications.  --Chemotherapy consent: Side effects including but does not limited to, fatigue, nausea, vomiting, diarrhea, hair loss, neuropathy, fluid retention, renal and kidney dysfunction, neutropenic fever, needed for blood transfusion, bleeding, heart failure, were discussed with patient in great detail.  -Given her high concerns for side effects on her heart and liver, I recommend moderately intensive Docetaxel and Cytoxan q3weeks for 4 cycles. She notes she is likely to proceed with this chemo but wants more time to think about it.  -If she is concerned with hair loss, she has option of Dignicap to reduce hair loss. I reviewed other [facility] resources available to her including dietician.  -I discussed given her positive LN, adjuvant Radiation after chemo is recommended to reduce her risk of local recurrence. She is agreeable.  -Given her ER/PR positive disease, I also still recommend antiestrogen to reduce her risk of recurrence. She is agreeable.  -She is healing well from surgery. Plan to start chemo in 3 weeks.  -F/u with start of treatment.    2. Comorbidities: Depression, Fibromyalgia, HLD, HTN, Hypothyroidism, H/o Skin cancer (squamous and basal cell), Fatty liver, sleep apnea  -She notes having Adrenal fatigue. I discussed based on her labs, this is not consistent with adrenal insufficiency. I recommend she see endocrinologist -On medications, continue to f/u with her other physicians.    3. Genetic testing, she will think about it.    PLAN:  -Surgical path and Mammaprint results reviewed, shows high risk for recurrence. I recommend adjuvant chemo TC for 4 cycles  -CT CAP w contrast and Bone scan in 1-2 weeks.  -Chemo education class in 1-2 weeks  -she will call us about her decision on chemo  -Lab, F/u and CT in 3 weeks.    No problem-specific Assessment & Plan notes found for this encounter.   Orders Placed This Encounter   Procedures  . CT CHEST ABDOMEN PELVIS W CONTRAST    Standing Status:   Future    Standing Expiration Date:   08/10/2021    Order Specific Question:   If indicated for the ordered procedure, I authorize the administration of contrast media per Radiology protocol    Answer:   Yes    Order Specific Question:   Preferred imaging location?    Answer:   [facility]    Order Specific Question:   Release to patient    Answer:   Immediate    Order Specific Question:   Is Oral Contrast requested for this exam?    Answer:   Yes, Per Radiology protocol    Order Specific Question:   Reason for Exam (SYMPTOM  OR DIAGNOSIS REQUIRED)    Answer:   Rule out cancer metastasis  . NM Bone Scan Whole Body    Standing Status:   Future    Standing Expiration Date:   08/10/2021    Order  Specific Question:   If indicated for the ordered procedure, I authorize the administration of a radiopharmaceutical per Radiology protocol    Answer:   Yes    Order Specific Question:   Preferred imaging location?    Answer:   [facility]  . CBC with Differential ([place] Only)    Standing Status:   Standing    Number of Occurrences:   20    Standing Expiration Date:   08/10/2021  . CMP ([place] only)    Standing Status:   Standing    Number of Occurrences:   20    Standing Expiration Date:   08/10/2021   All questions were answered. The patient knows to call the clinic with any problems, questions or concerns. No barriers to learning was detected. The total time spent in the appointment was 50 minutes.     Truitt Merle, MD 08/10/2020   I, Joslyn Devon, am acting as scribe for Truitt Merle, MD.   I have reviewed the above documentation for accuracy and completeness, and I agree with the above.

## 2020-08-13 ENCOUNTER — Telehealth: Payer: Self-pay | Admitting: Internal Medicine

## 2020-08-13 DIAGNOSIS — M9901 Segmental and somatic dysfunction of cervical region: Secondary | ICD-10-CM | POA: Diagnosis not present

## 2020-08-13 DIAGNOSIS — M7661 Achilles tendinitis, right leg: Secondary | ICD-10-CM | POA: Insufficient documentation

## 2020-08-13 DIAGNOSIS — M531 Cervicobrachial syndrome: Secondary | ICD-10-CM | POA: Diagnosis not present

## 2020-08-13 DIAGNOSIS — M9903 Segmental and somatic dysfunction of lumbar region: Secondary | ICD-10-CM | POA: Diagnosis not present

## 2020-08-13 DIAGNOSIS — M9902 Segmental and somatic dysfunction of thoracic region: Secondary | ICD-10-CM | POA: Diagnosis not present

## 2020-08-13 NOTE — Telephone Encounter (Signed)
Moved 4/25 appt to 4/27 due to provider pal.. called and left msg.

## 2020-08-14 ENCOUNTER — Encounter: Payer: Self-pay | Admitting: *Deleted

## 2020-08-14 ENCOUNTER — Telehealth: Payer: Self-pay | Admitting: Medical Oncology

## 2020-08-14 ENCOUNTER — Telehealth: Payer: Self-pay | Admitting: *Deleted

## 2020-08-14 NOTE — Telephone Encounter (Signed)
Spoke with patient to get an update on her decision on whether or not she will pursue chemo.  She tells she is not happy with her plan at the moment and is seeking 2nd opinion at [facility] which is not scheduled yet.  She states she feels like no one is listening to her concerns over her fatty liver and receiving chemo.  She tells me she is pursuing other options as well but will still attend chemo class on 4/8. Provided support and education regarding her treatment plan and she will call with her decision.

## 2020-08-14 NOTE — Telephone Encounter (Signed)
Faxed requested office note/moleculars and path  to [facility].

## 2020-08-17 ENCOUNTER — Other Ambulatory Visit: Payer: Self-pay | Admitting: Hematology

## 2020-08-17 ENCOUNTER — Other Ambulatory Visit: Payer: Self-pay

## 2020-08-17 ENCOUNTER — Inpatient Hospital Stay: Payer: Medicare HMO

## 2020-08-17 DIAGNOSIS — C50411 Malignant neoplasm of upper-outer quadrant of right female breast: Secondary | ICD-10-CM

## 2020-08-17 DIAGNOSIS — Z170 Estrogen receptor positive status [ER+]: Secondary | ICD-10-CM

## 2020-08-17 MED ORDER — ONDANSETRON HCL 8 MG PO TABS: 8.0000 mg | ORAL_TABLET | Freq: Two times a day (BID) | ORAL | 1 refills | Status: DC | PRN

## 2020-08-17 MED ORDER — LIDOCAINE-PRILOCAINE 2.5-2.5 % EX CREA: TOPICAL_CREAM | CUTANEOUS | 3 refills | Status: DC

## 2020-08-17 MED ORDER — DEXAMETHASONE 4 MG PO TABS: 8.0000 mg | ORAL_TABLET | Freq: Two times a day (BID) | ORAL | 1 refills | Status: DC

## 2020-08-17 MED ORDER — PROCHLORPERAZINE MALEATE 10 MG PO TABS: 10.0000 mg | ORAL_TABLET | Freq: Four times a day (QID) | ORAL | 1 refills | Status: DC | PRN

## 2020-08-20 ENCOUNTER — Encounter: Payer: Self-pay | Admitting: *Deleted

## 2020-08-20 ENCOUNTER — Other Ambulatory Visit: Payer: Self-pay

## 2020-08-20 ENCOUNTER — Ambulatory Visit (HOSPITAL_COMMUNITY)
Admission: RE | Admit: 2020-08-20 | Discharge: 2020-08-20 | Disposition: A | Discharge status: Home / Self Care | Payer: Medicare HMO | Source: Ambulatory Visit | Attending: Hematology | Admitting: Hematology

## 2020-08-20 DIAGNOSIS — C50411 Malignant neoplasm of upper-outer quadrant of right female breast: Secondary | ICD-10-CM | POA: Insufficient documentation

## 2020-08-20 DIAGNOSIS — Z170 Estrogen receptor positive status [ER+]: Secondary | ICD-10-CM

## 2020-08-20 DIAGNOSIS — I700 Atherosclerosis of aorta: Secondary | ICD-10-CM | POA: Diagnosis not present

## 2020-08-20 DIAGNOSIS — Z853 Personal history of malignant neoplasm of breast: Secondary | ICD-10-CM | POA: Diagnosis not present

## 2020-08-20 DIAGNOSIS — R590 Localized enlarged lymph nodes: Secondary | ICD-10-CM | POA: Diagnosis not present

## 2020-08-20 DIAGNOSIS — J9859 Other diseases of mediastinum, not elsewhere classified: Secondary | ICD-10-CM | POA: Diagnosis not present

## 2020-08-20 MED ORDER — IOHEXOL 300 MG/ML  SOLN
100.0000 mL | Freq: Once | INTRAMUSCULAR | Status: AC | PRN
  Administered 2020-08-20: 100 mL via INTRAVENOUS

## 2020-08-21 ENCOUNTER — Encounter: Payer: Self-pay | Admitting: Hematology

## 2020-08-21 DIAGNOSIS — Z7189 Other specified counseling: Secondary | ICD-10-CM | POA: Diagnosis not present

## 2020-08-21 DIAGNOSIS — E782 Mixed hyperlipidemia: Secondary | ICD-10-CM | POA: Diagnosis not present

## 2020-08-21 DIAGNOSIS — I10 Essential (primary) hypertension: Secondary | ICD-10-CM | POA: Diagnosis not present

## 2020-08-21 DIAGNOSIS — Z79899 Other long term (current) drug therapy: Secondary | ICD-10-CM | POA: Diagnosis not present

## 2020-08-21 DIAGNOSIS — C50411 Malignant neoplasm of upper-outer quadrant of right female breast: Secondary | ICD-10-CM | POA: Diagnosis not present

## 2020-08-21 DIAGNOSIS — Z170 Estrogen receptor positive status [ER+]: Secondary | ICD-10-CM | POA: Diagnosis not present

## 2020-08-22 ENCOUNTER — Encounter: Payer: Self-pay | Admitting: *Deleted

## 2020-08-22 DIAGNOSIS — N6489 Other specified disorders of breast: Secondary | ICD-10-CM | POA: Diagnosis not present

## 2020-08-22 DIAGNOSIS — R69 Illness, unspecified: Secondary | ICD-10-CM | POA: Diagnosis not present

## 2020-08-22 DIAGNOSIS — C773 Secondary and unspecified malignant neoplasm of axilla and upper limb lymph nodes: Secondary | ICD-10-CM | POA: Diagnosis not present

## 2020-08-22 DIAGNOSIS — C50911 Malignant neoplasm of unspecified site of right female breast: Secondary | ICD-10-CM | POA: Diagnosis not present

## 2020-08-23 NOTE — Progress Notes (Signed)
Pharmacist Chemotherapy Monitoring - Initial Assessment    Anticipated start date: 08/30/20  Regimen:  . Are orders appropriate based on the patient's diagnosis, regimen, and cycle? Yes . Does the plan date match the patient's scheduled date? Yes . Is the sequencing of drugs appropriate? yes . Are the premedications appropriate for the patient's regimen? Yes . Prior Authorization for treatment is: Pending o If applicable, is the correct biosimilar selected based on the patient's insurance? yes  Organ Function and Labs: Marland Kitchen Are dose adjustments needed based on the patient's renal function, hepatic function, or hematologic function? Yes . Are appropriate labs ordered prior to the start of patient's treatment? Yes . Other organ system assessment, if indicated: N/A . The following baseline labs, if indicated, have been ordered: N/A  Dose Assessment: . Are the drug doses appropriate? Yes . Are the following correct: o Drug concentrations Yes o IV fluid compatible with drug Yes o Administration routes Yes o Timing of therapy Yes . If applicable, does the patient have documented access for treatment and/or plans for port-a-cath placement? no . If applicable, have lifetime cumulative doses been properly documented and assessed? not applicable Lifetime Dose Tracking  No doses have been documented on this patient for the following tracked chemicals: Doxorubicin, Epirubicin, Idarubicin, Daunorubicin, Mitoxantrone, Bleomycin, Oxaliplatin, Carboplatin, Liposomal Doxorubicin  o   Toxicity Monitoring/Prevention: . The patient has the following take home antiemetics prescribed: Prochlorperazine . The patient has the following take home medications prescribed: N/A . Medication allergies and previous infusion related reactions, if applicable, have been reviewed and addressed. Yes . The patient's current medication list has been assessed for drug-drug interactions with their chemotherapy regimen. no  significant drug-drug interactions were identified on review.  Order Review: . Are the treatment plan orders signed? No . Is the patient scheduled to see a provider prior to their treatment? Yes  I verify that I have reviewed each item in the above checklist and answered each question accordingly.  Philomena Course, [place], 08/23/2020  1:05 PM

## 2020-08-24 ENCOUNTER — Telehealth: Payer: Self-pay | Admitting: *Deleted

## 2020-08-24 NOTE — Telephone Encounter (Signed)
Pt left vm regarding bone scan and isolation after chemo. Return pt call left vm regarding bone scan and need to use precautions day 7-10 after chemo. Contact information provided for further questions

## 2020-08-24 NOTE — Telephone Encounter (Signed)
Pt called to discuss anti-nausea medications post chemo tx. Gave instructions with each anti-nausea medication as well as provided education on nutrition.  Denies further questions or needs at this time.

## 2020-08-24 NOTE — Progress Notes (Signed)
The following biosimilar Udenyca ([id number]) has been selected for use in this patient.  Henreitta Leber, PharmD 08/24/20 @ 1115

## 2020-08-28 ENCOUNTER — Telehealth: Payer: Self-pay | Admitting: Hematology

## 2020-08-28 ENCOUNTER — Encounter (HOSPITAL_COMMUNITY)
Admission: RE | Admit: 2020-08-28 | Discharge: 2020-08-28 | Disposition: A | Discharge status: Home / Self Care | Payer: Medicare HMO | Source: Ambulatory Visit | Attending: Hematology | Admitting: Hematology

## 2020-08-28 ENCOUNTER — Other Ambulatory Visit: Payer: Self-pay

## 2020-08-28 DIAGNOSIS — C50411 Malignant neoplasm of upper-outer quadrant of right female breast: Secondary | ICD-10-CM | POA: Insufficient documentation

## 2020-08-28 DIAGNOSIS — Z170 Estrogen receptor positive status [ER+]: Secondary | ICD-10-CM | POA: Diagnosis not present

## 2020-08-28 DIAGNOSIS — M25571 Pain in right ankle and joints of right foot: Secondary | ICD-10-CM | POA: Diagnosis not present

## 2020-08-28 DIAGNOSIS — C50919 Malignant neoplasm of unspecified site of unspecified female breast: Secondary | ICD-10-CM | POA: Diagnosis not present

## 2020-08-28 MED ORDER — TECHNETIUM TC 99M MEDRONATE IV KIT
20.0000 | PACK | Freq: Once | INTRAVENOUS | Status: AC | PRN
  Administered 2020-08-28: 22 via INTRAVENOUS

## 2020-08-28 NOTE — Telephone Encounter (Signed)
Scheduled appt per 4/19 sch msg. Pt. Aware.

## 2020-08-29 ENCOUNTER — Encounter: Payer: Self-pay | Admitting: Hematology

## 2020-08-29 ENCOUNTER — Encounter: Payer: Self-pay | Admitting: *Deleted

## 2020-08-29 DIAGNOSIS — M7918 Myalgia, other site: Secondary | ICD-10-CM | POA: Diagnosis not present

## 2020-08-29 DIAGNOSIS — M5459 Other low back pain: Secondary | ICD-10-CM | POA: Diagnosis not present

## 2020-08-29 NOTE — Progress Notes (Signed)
Called pt to introduce myself as her Financial Resource Specialist, discuss copay assistance and the [employer] grant.  I left a msg requesting she return my call if she's interested in applying for the grants. 

## 2020-08-29 NOTE — Progress Notes (Signed)
Pt returned my call and unfortunately the foundation that had funds available are now closed to new enrollments.  I did offer the [employer], went over what it covers and gave her the income requirement.  Pt would like to apply so she will bring her proof of income on 08/30/20.  If approved I will give her an expense sheet and my card for any questions or concerns she may have in the future.

## 2020-08-29 NOTE — Progress Notes (Signed)
[name]   [phone] [phone]   Clinic Follow up Note   Patient Care Team: Panosh, Standley Brooking, MD as PCP - General Fay Records, MD as PCP - Cardiology (Cardiology) Clance, Armando Reichert, MD (Pulmonary Disease) Arvella Nigh, MD as Consulting Physician (Obstetrics and Gynecology) Dr Peggye Form, MD as Consulting Physician (Orthopedic Surgery) Ladene Artist, MD as Consulting Physician (Gastroenterology) Melina Schools, MD as Consulting Physician (Orthopedic Surgery) Rockwell Germany, RN as Oncology Nurse Navigator Mauro Kaufmann, RN as Oncology Nurse Navigator Jovita Kussmaul, MD as Consulting Physician (General Surgery) Truitt Merle, MD as Consulting Physician (Hematology) Eppie Gibson, MD as Attending Physician (Radiation Oncology)  Date of Service:  08/30/2020  CHIEF COMPLAINT: f/u of Left breast DCIS and Right breast cancer  SUMMARY OF ONCOLOGIC HISTORY: Oncology History Overview Note  Cancer Staging Ductal carcinoma in situ (DCIS) of left breast Staging form: Breast, AJCC 8th Edition - Clinical stage from 06/21/2020: Stage 0 (cTis (DCIS), cN0, cM0, G2, ER+, PR+, HER2: Not Assessed) - Signed by Truitt Merle, MD on 06/26/2020 Stage prefix: Initial diagnosis  Malignant neoplasm of upper-outer quadrant of right breast in female, estrogen receptor positive ([place]) Staging form: Breast, AJCC 8th Edition - Clinical stage from 06/21/2020: Stage IA (cT1b, cN0, cM0, G2, ER+, PR+, HER2-) - Signed by Truitt Merle, MD on 06/26/2020 Stage prefix: Initial diagnosis - Pathologic stage from 07/26/2020: Stage IA (pT1c, pN1a, cM0, G2, ER+, PR+, HER2-) - Signed by Truitt Merle, MD on 08/09/2020 Stage prefix: Initial diagnosis Nuclear grade: G2 Histologic grading system: 3 grade system Residual tumor (R): R0 - None    Malignant neoplasm of upper-outer quadrant of right breast in female, estrogen receptor positive ([place])  06/08/2020 Mammogram   IMPRESSION: 1. 9 x 7 x  6 mm mass in the 12 o'clock position of the right breast, 2cmfn with imaging features highly suspicious for malignancy. 2. 4 mm group of indeterminate calcifications in the 12 o'clock position of the left breast and 4 mm group of indeterminate calcifications in the 1 o'clock position of the left breast. Together, the groups span an area measuring 3.9 cm.   06/21/2020 Cancer Staging   Staging form: Breast, AJCC 8th Edition - Clinical stage from 06/21/2020: Stage IA (cT1b, cN0, cM0, G2, ER+, PR+, HER2-) - Signed by Truitt Merle, MD on 06/26/2020 Stage prefix: Initial diagnosis   06/21/2020 Initial Biopsy   Diagnosis 1. Breast, right, needle core biopsy, 12 oc - INVASIVE MAMMARY CARCINOMA - MAMMARY CARCINOMA IN SITU - SEE COMMENT 2. Breast, left, needle core biopsy, 12 oc - MAMMARY CARCINOMA IN-SITU WITH NECROSIS AND CALCIFICATIONS - SEE COMMENT 3. Breast, left, needle core biopsy, 1 oc - MAMMARY CARCINOMA IN-SITU WITH NECROSIS AND CALCIFICATIONS - SEE COMMENT Microscopic Comment 1. The biopsy material shows an infiltrative proliferation of cells with large vesicular nuclei with inconspicuous nucleoli, arranged linearly and in small clusters. Based on the biopsy, the carcinoma appears Nottingham grade 2 of 3 and measures 0.8 cm in greatest linear extent. E-cadherin and prognostic markers (ER/PR/ki-67/HER2)are pending and will be reported in an addendum. Dr. Jeannie Done reviewed the case and agrees with the above diagnosis. These results were called to [facility] on June 22, 2020. 2. and 3. E-cadherin is pending and will be reported in an addendum.     1. E-cadherin is POSITIVE supporting a ductal origin. 2. E-cadherin is POSITIVE supporting a ductal origin. A prognostic panel (ER/PR) is pending and will  be reported in an addendum 3. E-cadherin is positive supporting a ductal origin. The focus is less pronounced on the deeper sections and in isolation would likely be  considered atypical ductal hyperplasia.   06/21/2020 Receptors her2   1. PROGNOSTIC INDICATORS Results: IMMUNOHISTOCHEMICAL AND MORPHOMETRIC ANALYSIS PERFORMED MANUALLY The tumor cells are EQUIVOCAL for Her2 (2+). HER2 by FISH will be performed and the results reported separately Estrogen Receptor: 95%, POSITIVE, STRONG STAINING INTENSITY Progesterone Receptor: 40%, POSITIVE, MODERATE STAINING INTENSITY Proliferation Marker Ki67: 10%  1. FLUORESCENCE IN-SITU HYBRIDIZATION Results: GROUP 5: HER2 **NEGATIVE** Equivocal form of amplification of the HER2 gene was detected in the IHC 2+ tissue sample received from this individual. HER2 FISH was performed by a technologist and cell imaging and analysis on the [employer].   06/25/2020 Initial Diagnosis   Malignant neoplasm of upper-outer quadrant of right breast in female, estrogen receptor positive ([place])   07/03/2020 Breast MRI   IMPRESSION: 1. Known RIGHT breast cancer, 12 o'clock axis, at anterior depth, measuring 1 cm greatest extent, manifesting as a spiculated enhancing mass on MRI, with associated biopsy clip. Expected post biopsy changes are seen within the adjacent outer RIGHT breast. 2. No evidence of additional multifocal or multicentric disease within the RIGHT breast. 3. Known LEFT breast DCIS within the slightly outer LEFT breast, at anterior depth, corresponding to the biopsy site labeled 1 o'clock axis, with associated enhancement only at the margins of the biopsy cavity measuring up to 5 mm greatest dimension. 4. Known LEFT breast DCIS within the upper central LEFT breast, at middle depth, corresponding to the biopsy site labeled 12 o'clock axis. Contiguous linear non-mass enhancement extends 2.3 cm superior-medial to the biopsy cavity, most likely post biopsy change but possibly contiguous extent of disease. 5. No evidence of additional multifocal or multicentric disease within the LEFT breast. 6. No evidence of  metastatic lymphadenopathy.     07/26/2020 Cancer Staging   Staging form: Breast, AJCC 8th Edition - Pathologic stage from 07/26/2020: Stage IA (pT1c, pN1a, cM0, G2, ER+, PR+, HER2-) - Signed by Truitt Merle, MD on 08/09/2020 Stage prefix: Initial diagnosis Nuclear grade: G2 Histologic grading system: 3 grade system Residual tumor (R): R0 - None   07/26/2020 Surgery   RIGHT BREAST LUMPECTOMY WITH RADIOACTIVE SEED AND SENTINEL LYMPH NODE BIOPSY by Dr Marlou Starks   07/26/2020 Pathology Results   FINAL MICROSCOPIC DIAGNOSIS:   A. LYMPH NODE, RIGHT AXILLARY #1, SENTINEL, EXCISION:  - Lymph node, negative for carcinoma (0/1)   B. LYMPH NODE, RIGHT AXILLARY, SENTINEL, EXCISION:  - Benign fibroadipose tissue, negative for carcinoma   C. BREAST, RIGHT, LUMPECTOMY:  - Invasive ductal carcinoma, 1.5 cm, grade 2  - Ductal carcinoma in situ, low grade  - Resection margins are negative for carcinoma; closest is the anterior  margin of 0.2 cm  - Biopsy site changes  - See oncology table   D. BREAST, LEFT, LUMPECTOMY:  - Benign breast parenchyma with prominent biopsy-related changes  - Negative for residual ductal carcinoma in situ  - See oncology table   E. LYMPH NODE, RIGHT AXILLARY #2, SENTINEL, EXCISION:  - Invasive ductal carcinoma, see comment     COMMENT:   E.  Lymph node tissue is not identified.  Findings likely represent an  entirely replaced lymph node with foci of extranodal extension.    07/26/2020 Miscellaneous   Mammaprint High Risk of Luminla Type B 29% risk of recurrence in 10 years if untreated.  Her Mammaprint index is -0.175 She has 94.6%  benefit of chemotherapy and hormaonal therapy.      08/20/2020 Imaging   CT C/A/P IMPRESSION: 1. No definitive imaging findings to suggest metastatic disease in the chest, abdomen or pelvis. 2. Postoperative changes of bilateral lumpectomy and right axillary lymph node dissection with what appears to be a large postoperative seroma  in the right axilla, as detailed above. Attention on follow-up studies is recommended to ensure the stability or regression of this collection. 3. Additional incidental findings, as above.   08/28/2020 Imaging   Bone Scan IMPRESSION: No definite scintigraphic evidence of osseous metastases.   08/30/2020 -  Chemotherapy    Patient is on Treatment Plan: BREAST TC Q21D      Ductal carcinoma in situ (DCIS) of left breast  06/08/2020 Mammogram   IMPRESSION: 1. 9 x 7 x 6 mm mass in the 12 o'clock position of the right breast, 2cmfn with imaging features highly suspicious for malignancy. 2. 4 mm group of indeterminate calcifications in the 12 o'clock position of the left breast and 4 mm group of indeterminate calcifications in the 1 o'clock position of the left breast. Together, the groups span an area measuring 3.9 cm.   06/21/2020 Cancer Staging   Staging form: Breast, AJCC 8th Edition - Clinical stage from 06/21/2020: Stage 0 (cTis (DCIS), cN0, cM0, G2, ER+, PR+, HER2: Not Assessed) - Signed by Truitt Merle, MD on 06/26/2020 Stage prefix: Initial diagnosis   06/21/2020 Initial Biopsy   Diagnosis 1. Breast, right, needle core biopsy, 12 oc - INVASIVE MAMMARY CARCINOMA - MAMMARY CARCINOMA IN SITU - SEE COMMENT 2. Breast, left, needle core biopsy, 12 oc - MAMMARY CARCINOMA IN-SITU WITH NECROSIS AND CALCIFICATIONS - SEE COMMENT 3. Breast, left, needle core biopsy, 1 oc - MAMMARY CARCINOMA IN-SITU WITH NECROSIS AND CALCIFICATIONS - SEE COMMENT Microscopic Comment 1. The biopsy material shows an infiltrative proliferation of cells with large vesicular nuclei with inconspicuous nucleoli, arranged linearly and in small clusters. Based on the biopsy, the carcinoma appears Nottingham grade 2 of 3 and measures 0.8 cm in greatest linear extent. E-cadherin and prognostic markers (ER/PR/ki-67/HER2)are pending and will be reported in an addendum. Dr. Jeannie Done reviewed the case and agrees with the  above diagnosis. These results were called to [facility] on June 22, 2020. 2. and 3. E-cadherin is pending and will be reported in an addendum.     1. E-cadherin is POSITIVE supporting a ductal origin. 2. E-cadherin is POSITIVE supporting a ductal origin. A prognostic panel (ER/PR) is pending and will be reported in an addendum 3. E-cadherin is positive supporting a ductal origin. The focus is less pronounced on the deeper sections and in isolation would likely be considered atypical ductal hyperplasia.   06/25/2020 Initial Diagnosis   Ductal carcinoma in situ (DCIS) of left breast    Receptors her2   2. PROGNOSTIC INDICATORS Results: IMMUNOHISTOCHEMICAL AND MORPHOMETRIC ANALYSIS PERFORMED MANUALLY Estrogen Receptor: 95%, POSITIVE, STRONG STAINING INTENSITY Progesterone Receptor: 30%, POSITIVE, STRONG STAINING INTENSITY   07/03/2020 Breast MRI   IMPRESSION: 1. Known RIGHT breast cancer, 12 o'clock axis, at anterior depth, measuring 1 cm greatest extent, manifesting as a spiculated enhancing mass on MRI, with associated biopsy clip. Expected post biopsy changes are seen within the adjacent outer RIGHT breast. 2. No evidence of additional multifocal or multicentric disease within the RIGHT breast. 3. Known LEFT breast DCIS within the slightly outer LEFT breast, at anterior depth, corresponding to the biopsy site labeled 1 o'clock axis, with associated enhancement only  at the margins of the biopsy cavity measuring up to 5 mm greatest dimension. 4. Known LEFT breast DCIS within the upper central LEFT breast, at middle depth, corresponding to the biopsy site labeled 12 o'clock axis. Contiguous linear non-mass enhancement extends 2.3 cm superior-medial to the biopsy cavity, most likely post biopsy change but possibly contiguous extent of disease. 5. No evidence of additional multifocal or multicentric disease within the LEFT breast. 6. No evidence of  metastatic lymphadenopathy.     07/26/2020 Surgery   LEFT BREAST LUMPECTOMY X 2  WITH RADIOACTIVE SEED LOCALIZATION by Dr Marlou Starks   07/26/2020 Pathology Results   FINAL MICROSCOPIC DIAGNOSIS:   A. LYMPH NODE, RIGHT AXILLARY #1, SENTINEL, EXCISION:  - Lymph node, negative for carcinoma (0/1)   B. LYMPH NODE, RIGHT AXILLARY, SENTINEL, EXCISION:  - Benign fibroadipose tissue, negative for carcinoma   C. BREAST, RIGHT, LUMPECTOMY:  - Invasive ductal carcinoma, 1.5 cm, grade 2  - Ductal carcinoma in situ, low grade  - Resection margins are negative for carcinoma; closest is the anterior  margin of 0.2 cm  - Biopsy site changes  - See oncology table   D. BREAST, LEFT, LUMPECTOMY:  - Benign breast parenchyma with prominent biopsy-related changes  - Negative for residual ductal carcinoma in situ  - See oncology table   E. LYMPH NODE, RIGHT AXILLARY #2, SENTINEL, EXCISION:  - Invasive ductal carcinoma, see comment     COMMENT:   E.  Lymph node tissue is not identified.  Findings likely represent an  entirely replaced lymph node with foci of extranodal extension.       CURRENT THERAPY:  Starting Docetaxel and Cytoxan q3weeks for 4 cycles.  INTERVAL HISTORY:  [name] is here for a follow up of breast cancer. She was last seen by me on 08/10/20. She presents to the clinic accompanied by her husband. She notes she is doing well today. She notes she still has a seroma under the breast, which is not too bothersome. She notes she is eating a low-glycemic diet for her fatty liver. She is allergic to wheat and dairy, and also stays away from red meats.  REVIEW OF SYSTEMS:   Constitutional: Denies fevers, chills or abnormal weight loss Eyes: Denies blurriness of vision Ears, nose, mouth, throat, and face: Denies mucositis or sore throat Respiratory: Denies cough, dyspnea or wheezes Cardiovascular: Denies palpitation, chest discomfort or lower extremity  swelling Gastrointestinal:  Denies nausea, heartburn or change in bowel habits Skin: Denies abnormal skin rashes Lymphatics: Denies new lymphadenopathy or easy bruising Neurological:Denies numbness, tingling or new weaknesses Behavioral/Psych: Mood is stable, no new changes  All other systems were reviewed with the patient and are negative.  MEDICAL HISTORY:  Past Medical History:  Diagnosis Date  . Anal fissure   . Atrial septal aneurysm / if pfo  echo 6 13  10/23/2011  . Breast cancer ([place])   . Cataract    both eyes  . Depression   . Fatty liver    "pre fatty liver"  . Fibromyalgia   . Headache(784.0)    hx of migraines when younger  . Heart palpitations    hx with normal holter event monitoring  . Hx: UTI (urinary tract infection)   . Hyperlipidemia   . Hypertension   . Hypothyroidism   . Obesity   . Pneumonia 1972   hx of  . Polyp of colon   . 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 Skin cancer  basal, squamous cell  . Sleep apnea    uses cpap    SURGICAL HISTORY: Past Surgical History:  Procedure Laterality Date  . BREAST LUMPECTOMY WITH RADIOACTIVE SEED AND SENTINEL LYMPH NODE BIOPSY Right 07/26/2020   Procedure: RIGHT BREAST LUMPECTOMY WITH RADIOACTIVE SEED AND SENTINEL LYMPH NODE BIOPSY;  Surgeon: Jovita Kussmaul, MD;  Location: [facility];  Service: General;  Laterality: Right;  . BREAST LUMPECTOMY WITH RADIOACTIVE SEED LOCALIZATION Left 07/26/2020   Procedure: LEFT BREAST LUMPECTOMY X 2  WITH RADIOACTIVE SEED LOCALIZATION;  Surgeon: Jovita Kussmaul, MD;  Location: [facility];  Service: General;  Laterality: Left;  . COLONOSCOPY    . DILATION AND CURETTAGE OF UTERUS  1978  . [place]  . KNEE ARTHROSCOPY     both in past  . POLYPECTOMY    . SHOULDER OPEN ROTATOR CUFF REPAIR Left 07/07/2013   Procedure: LEFT SHOULDER MINI OPEN SUBACROMIAL DECOMPRESSION ROTATOR CUFF REPAIR AND POSSIBLE PATCH GRAFT ;  Surgeon: Johnn Hai, MD;  Location: [facility];   Service: Orthopedics;  Laterality: Left;  with interscaline block  . SKIN CANCER EXCISION Bilateral    arm, legs, and chest  . TONSILLECTOMY      I have reviewed the social history and family history with the patient and they are unchanged from previous note.  ALLERGIES:  is allergic to penicillins, cefdinir, norvasc [amlodipine besylate], cetirizine, irbesartan, molds & smuts, and tylenol [acetaminophen].  MEDICATIONS:  Current Outpatient Medications  Medication Sig Dispense Refill  . Ascorbic Acid (VITAMIN C) 1000 MG tablet Take 1,000 mg by mouth daily.    Marland Kitchen b complex vitamins capsule Take 1 capsule by mouth daily.    . Bioflavonoid Products (QUERCETIN COMPLEX IMMUNE PO) Take 1 capsule by mouth daily.    . Cholecalciferol (VITAMIN D) 50 MCG (2000 UT) tablet Take 4,000 Units by mouth daily.    Marland Kitchen dexamethasone (DECADRON) 4 MG tablet Take 2 tablets (8 mg total) by mouth 2 (two) times daily. Start the day before Taxotere. Then again the day after chemo for 3 days. 30 tablet 1  . hydrochlorothiazide (HYDRODIURIL) 25 MG tablet TAKE 1 TABLET BY MOUTH EVERY DAY (Patient taking differently: Take 25 mg by mouth daily.) 90 tablet 0  . hydroxychloroquine (PLAQUENIL) 200 MG tablet Take 200 mg by mouth 2 (two) times a week.    . lidocaine-prilocaine (EMLA) cream Apply to affected area once 30 g 3  . LORazepam (ATIVAN) 1 MG tablet Take 0.5-1 tablets (0.5-1 mg total) by mouth every 8 (eight) hours as needed for anxiety. anxiety 20 tablet 0  . MAGNESIUM CARBONATE PO Take 1 tablet by mouth daily.    . melatonin 5 MG TABS Take 5 mg by mouth at bedtime.    . Menaquinone-7 (VITAMIN K2) 100 MCG CAPS Take 100 mcg by mouth daily.    . metoprolol succinate (TOPROL XL) 25 MG 24 hr tablet TAKE ONCE A DAY AS DIRECTED (Patient taking differently: Take by mouth daily as needed (Blood pressure). TAKE ONCE A DAY AS DIRECTED) 30 tablet 1  . Multiple Vitamin (MULTIVITAMIN) tablet Take 1 tablet by mouth daily.    . NON  FORMULARY Take 48.6 mg by mouth See admin instructions. Thyroid desiccated (procine) sr 2 capsules 5 days a week, 3 capsules 2 days per week (Tues and Fri)    . OLIVE LEAF EXTRACT PO Take 1 capsule by mouth daily.    . ondansetron (ZOFRAN) 8 MG tablet Take 1 tablet (  8 mg total) by mouth 2 (two) times daily as needed for refractory nausea / vomiting. Start on day 3 after chemo. 30 tablet 1  . OVER THE COUNTER MEDICATION Take 2 capsules by mouth in the morning and at bedtime. Ifnla Med    . OVER THE COUNTER MEDICATION Take 1 capsule by mouth daily. Essential Pro    . OVER THE COUNTER MEDICATION Take 1 capsule by mouth daily. 5 HTP    . potassium chloride SA (KLOR-CON) 20 MEQ tablet TAKE ONE TABLET BY MOUTH ONE TIME DAILY 90 tablet 0  . Probiotic Product (PROBIOTIC PO) Take 1 capsule by mouth daily.    . prochlorperazine (COMPAZINE) 10 MG tablet Take 1 tablet (10 mg total) by mouth every 6 (six) hours as needed (Nausea or vomiting). 30 tablet 1  . traMADol (ULTRAM) 50 MG tablet Take 1-2 tablets (50-100 mg total) by mouth every 6 (six) hours as needed. (Patient not taking: Reported on 08/09/2020) 20 tablet 1  . VITAMIN E PO Take 23 mg by mouth daily.    Marland Kitchen ZINC GLUCONATE PO Take 10 mg by mouth daily.     No current facility-administered medications for this visit.   Facility-Administered Medications Ordered in Other Visits  Medication Dose Route Frequency Provider Last Rate Last Admin  . 0.9 %  sodium chloride infusion   Intravenous Once PRN Truitt Merle, MD   Stopped at 08/30/20 1222    PHYSICAL EXAMINATION: ECOG PERFORMANCE STATUS: 0 - Asymptomatic  Vitals:   08/30/20 0855  BP: (!) 151/73  Pulse: 74  Resp: 17  Temp: (!) 97 F (36.1 C)  SpO2: 99%   Filed Weights   08/30/20 0855  Weight: 208 lb 6.4 oz (94.5 kg)    GENERAL:alert, no distress and comfortable SKIN: skin color, texture, turgor are normal, no rashes or significant lesions EYES: normal, Conjunctiva are pink and non-injected,  sclera clear  LYMPH:  no palpable lymphadenopathy in the cervical, axillary  NEURO: alert & oriented x 3 with fluent speech, no focal motor/sensory deficits Breasts: seroma present under right arm; incision healing well.  LABORATORY DATA:  I have reviewed the data as listed CBC Latest Ref Rng & Units 08/30/2020 07/20/2020 06/27/2020  WBC 4.0 - 10.5 K/uL 10.8(H) 8.8 8.3  Hemoglobin 12.0 - 15.0 g/dL 13.3 13.6 13.9  Hematocrit 36.0 - 46.0 % 39.6 41.7 41.6  Platelets 150 - 400 K/uL 219 207 245     CMP Latest Ref Rng & Units 08/30/2020 07/20/2020 06/27/2020  Glucose 70 - 99 mg/dL 130(H) 92 93  BUN 8 - 23 mg/dL $Remove'17 17 18  'CwFpvlo$ Creatinine 0.44 - 1.00 mg/dL 0.77 0.76 0.88  Sodium 135 - 145 mmol/L 141 140 142  Potassium 3.5 - 5.1 mmol/L 3.9 3.3(L) 3.9  Chloride 98 - 111 mmol/L 105 105 102  CO2 22 - 32 mmol/L $RemoveB'23 27 29  'mudUiSlX$ Calcium 8.9 - 10.3 mg/dL 9.8 9.4 9.5  Total Protein 6.5 - 8.1 g/dL 7.3 6.9 7.6  Total Bilirubin 0.3 - 1.2 mg/dL 0.5 0.8 0.8  Alkaline Phos 38 - 126 U/L 127(H) 106 113  AST 15 - 41 U/L 78(H) 35 49(H)  ALT 0 - 44 U/L 101(H) 54(H) 69(H)      RADIOGRAPHIC STUDIES: I have personally reviewed the radiological images as listed and agreed with the findings in the report. No results found.   ASSESSMENT & PLAN:  [name] is a 65 y.o. female with   1.Malignant neoplasm of upper-outer quadrant of right  breast, StageIB, p(T1cN1aM0), ER+/PR+/HER2-, [place] Fresh AND Leftbreast DCIS, gradeII, ER+/PR+(removed by biopsy) -She was diagnosed in 06/2020.She was found to have b/l masses on mammogram. On biopsy she was seen to have2 small area ofDCIS inleft breast and 0.8cm right breast invasive ductal carcinoma. -She underwent B/l lumpectomy and right SNLB by Dr Marlou Starks on 07/26/20. Her surgical path showed her left breast DCIS was completely removed with biopsy and no residual DCIS on path. Her grade II, 1.5cm invasive ductal carcinoma of right breast was completely removed with clear  margins and 1/2 positive LNs. I discussed path with patient in great detail today.  -Given positive LN, Mammaprint was done and results show high risk of Luminal Type B with 29% risk of distant recurrence. She has 94.6% chance of 5-year recurrence free with chemotherapy and hormonal therapy.  -Staging CT C/A/P on 08/20/20 and bone scan on 08/28/20 were negative for metastatic disease. -Given her high risk of cancer I recommend adjuvant chemotherapy to reduce her risk. Given her high concerns for side effects on her heart and liver, I recommend moderately intensive Docetaxel and Cytoxan q3weeks for 4 cycles.  -I reviewed the chemo effects on the liver which is her main concern and that we will monitor her lab work throughout chemo. Her AST and ALT are elevated today, will reduce her chemo dose by 15% today. I reviewed maintaining a good, protein-rich diet. -I discussed the two papers she gave me about Vimentin and chemo induced secondary cancer in lengthy today   2. Comorbidities:Depression, Fibromyalgia, HLD, HTN, Hypothyroidism, H/o Skin cancer(squamous and basal cell), sleep apnea  -She notes having Adrenal fatigue. I discussed based on her labs, this is not consistent with adrenal insufficiency. I recommend she see endocrinologist -Labs reviewed-- AST 78, ALT 101 today (08/30/20) -She is followed for HTN. She takes her BP at home. The bottom number is always low, and the top number goes to normal when she lays down. -On medications, continue to f/u with her other physicians.   3. Genetic testing, she will think about it.   4.  Fatty liver and transaminitis -She has chronic intermittent transaminitis, which she contributes to fatty liver.  She is on low sugar diet and drinks a lot of water -Liver morphology was unremarkable on recent CT scan -Today's labs showed mild transaminitis, will continue monitoring -She does not drink alcohol, she knows to avoid Tylenol.   PLAN: -Labs reviewed,  adequate to start first dose Docetaxel and Cytoxan today with dose reduction due to her transaminitis -Lab and f/u for toxicity check in one week    No problem-specific Assessment & Plan notes found for this encounter.   Orders Placed This Encounter  Procedures  . IR IMAGING GUIDED PORT INSERTION    Standing Status:   Future    Standing Expiration Date:   08/30/2021    Order Specific Question:   Reason for Exam (SYMPTOM  OR DIAGNOSIS REQUIRED)    Answer:   chemotherapy, limit venous access, breast cancer    Order Specific Question:   Preferred Imaging Location?    Answer:   [facility]   All questions were answered. The patient knows to call the clinic with any problems, questions or concerns. No barriers to learning was detected. The total time spent in the appointment was 40 minutes.     Truitt Merle, MD 08/30/2020   I, Wilburn Mylar, am acting as scribe for Truitt Merle, MD.   I have reviewed the above documentation for accuracy  and completeness, and I agree with the above.

## 2020-08-30 ENCOUNTER — Inpatient Hospital Stay (HOSPITAL_BASED_OUTPATIENT_CLINIC_OR_DEPARTMENT_OTHER): Payer: Medicare HMO | Admitting: Medical

## 2020-08-30 ENCOUNTER — Other Ambulatory Visit: Payer: Self-pay

## 2020-08-30 ENCOUNTER — Encounter: Payer: Self-pay | Admitting: Hematology

## 2020-08-30 ENCOUNTER — Inpatient Hospital Stay: Payer: Medicare HMO

## 2020-08-30 ENCOUNTER — Telehealth: Payer: Self-pay | Admitting: *Deleted

## 2020-08-30 ENCOUNTER — Telehealth: Payer: Self-pay | Admitting: Hematology

## 2020-08-30 ENCOUNTER — Inpatient Hospital Stay: Payer: Medicare HMO | Admitting: Hematology

## 2020-08-30 ENCOUNTER — Encounter: Payer: Self-pay | Admitting: *Deleted

## 2020-08-30 ENCOUNTER — Other Ambulatory Visit (HOSPITAL_COMMUNITY): Payer: Self-pay | Admitting: Family Medicine

## 2020-08-30 VITALS — BP 151/73 | HR 74 | Temp 97.0°F | Resp 17 | Ht 66.0 in | Wt 208.4 lb

## 2020-08-30 VITALS — BP 152/62 | HR 63 | Temp 98.1°F | Resp 17

## 2020-08-30 DIAGNOSIS — Z170 Estrogen receptor positive status [ER+]: Secondary | ICD-10-CM

## 2020-08-30 DIAGNOSIS — G4730 Sleep apnea, unspecified: Secondary | ICD-10-CM | POA: Diagnosis not present

## 2020-08-30 DIAGNOSIS — M797 Fibromyalgia: Secondary | ICD-10-CM | POA: Diagnosis not present

## 2020-08-30 DIAGNOSIS — C50411 Malignant neoplasm of upper-outer quadrant of right female breast: Secondary | ICD-10-CM | POA: Diagnosis not present

## 2020-08-30 DIAGNOSIS — E039 Hypothyroidism, unspecified: Secondary | ICD-10-CM | POA: Diagnosis not present

## 2020-08-30 DIAGNOSIS — E669 Obesity, unspecified: Secondary | ICD-10-CM | POA: Diagnosis not present

## 2020-08-30 DIAGNOSIS — T8090XA Unspecified complication following infusion and therapeutic injection, initial encounter: Secondary | ICD-10-CM

## 2020-08-30 DIAGNOSIS — Z5111 Encounter for antineoplastic chemotherapy: Secondary | ICD-10-CM | POA: Diagnosis not present

## 2020-08-30 DIAGNOSIS — Z5189 Encounter for other specified aftercare: Secondary | ICD-10-CM | POA: Diagnosis not present

## 2020-08-30 DIAGNOSIS — D0512 Intraductal carcinoma in situ of left breast: Secondary | ICD-10-CM

## 2020-08-30 DIAGNOSIS — E785 Hyperlipidemia, unspecified: Secondary | ICD-10-CM | POA: Diagnosis not present

## 2020-08-30 DIAGNOSIS — I10 Essential (primary) hypertension: Secondary | ICD-10-CM | POA: Diagnosis not present

## 2020-08-30 LAB — CBC WITH DIFFERENTIAL (CANCER CENTER ONLY)
Abs Immature Granulocytes: 0.05 10*3/uL (ref 0.00–0.07)
Basophils Absolute: 0 10*3/uL (ref 0.0–0.1)
Basophils Relative: 0 %
Eosinophils Absolute: 0 10*3/uL (ref 0.0–0.5)
Eosinophils Relative: 0 %
HCT: 39.6 % (ref 36.0–46.0)
Hemoglobin: 13.3 g/dL (ref 12.0–15.0)
Immature Granulocytes: 1 %
Lymphocytes Relative: 18 %
Lymphs Abs: 1.9 10*3/uL (ref 0.7–4.0)
MCH: 30.1 pg (ref 26.0–34.0)
MCHC: 33.6 g/dL (ref 30.0–36.0)
MCV: 89.6 fL (ref 80.0–100.0)
Monocytes Absolute: 0.6 10*3/uL (ref 0.1–1.0)
Monocytes Relative: 5 %
Neutro Abs: 8.3 10*3/uL — ABNORMAL HIGH (ref 1.7–7.7)
Neutrophils Relative %: 76 %
Platelet Count: 219 10*3/uL (ref 150–400)
RBC: 4.42 MIL/uL (ref 3.87–5.11)
RDW: 13.1 % (ref 11.5–15.5)
WBC Count: 10.8 10*3/uL — ABNORMAL HIGH (ref 4.0–10.5)
nRBC: 0 % (ref 0.0–0.2)

## 2020-08-30 LAB — CMP (CANCER CENTER ONLY)
ALT: 101 U/L — ABNORMAL HIGH (ref 0–44)
AST: 78 U/L — ABNORMAL HIGH (ref 15–41)
Albumin: 4.1 g/dL (ref 3.5–5.0)
Alkaline Phosphatase: 127 U/L — ABNORMAL HIGH (ref 38–126)
Anion gap: 13 (ref 5–15)
BUN: 17 mg/dL (ref 8–23)
CO2: 23 mmol/L (ref 22–32)
Calcium: 9.8 mg/dL (ref 8.9–10.3)
Chloride: 105 mmol/L (ref 98–111)
Creatinine: 0.77 mg/dL (ref 0.44–1.00)
GFR, Estimated: >60 mL/min (ref >60)
Glucose, Bld: 130 mg/dL — ABNORMAL HIGH (ref 70–99)
Potassium: 3.9 mmol/L (ref 3.5–5.1)
Sodium: 141 mmol/L (ref 135–145)
Total Bilirubin: 0.5 mg/dL (ref 0.3–1.2)
Total Protein: 7.3 g/dL (ref 6.5–8.1)

## 2020-08-30 MED ORDER — LORAZEPAM 2 MG/ML IJ SOLN
INTRAMUSCULAR | Status: AC
  Filled 2020-08-30: qty 1

## 2020-08-30 MED ORDER — SODIUM CHLORIDE 0.9 % IV SOLN
Freq: Once | INTRAVENOUS | Status: DC | PRN
  Filled 2020-08-30: qty 250

## 2020-08-30 MED ORDER — PALONOSETRON HCL INJECTION 0.25 MG/5ML
INTRAVENOUS | Status: AC
  Filled 2020-08-30: qty 5

## 2020-08-30 MED ORDER — SODIUM CHLORIDE 0.9 % IV SOLN
500.0000 mg/m2 | Freq: Once | INTRAVENOUS | Status: AC
  Administered 2020-08-30: 1040 mg via INTRAVENOUS
  Filled 2020-08-30: qty 52

## 2020-08-30 MED ORDER — SODIUM CHLORIDE 0.9 % IV SOLN
Freq: Once | INTRAVENOUS | Status: AC
  Filled 2020-08-30: qty 250

## 2020-08-30 MED ORDER — SODIUM CHLORIDE 0.9 % IV SOLN
65.0000 mg/m2 | Freq: Once | INTRAVENOUS | Status: AC
  Administered 2020-08-30: 140 mg via INTRAVENOUS
  Filled 2020-08-30: qty 14

## 2020-08-30 MED ORDER — SODIUM CHLORIDE 0.9 % IV SOLN
10.0000 mg | Freq: Once | INTRAVENOUS | Status: AC
  Administered 2020-08-30: 10 mg via INTRAVENOUS
  Filled 2020-08-30: qty 10

## 2020-08-30 MED ORDER — FAMOTIDINE IN NACL 20-0.9 MG/50ML-% IV SOLN
20.0000 mg | Freq: Once | INTRAVENOUS | Status: AC | PRN
  Administered 2020-08-30: 20 mg via INTRAVENOUS

## 2020-08-30 MED ORDER — PALONOSETRON HCL INJECTION 0.25 MG/5ML
0.2500 mg | Freq: Once | INTRAVENOUS | Status: AC
  Administered 2020-08-30: 0.25 mg via INTRAVENOUS

## 2020-08-30 MED ORDER — DIPHENHYDRAMINE HCL 50 MG/ML IJ SOLN
50.0000 mg | Freq: Once | INTRAMUSCULAR | Status: AC | PRN
  Administered 2020-08-30: 25 mg via INTRAVENOUS

## 2020-08-30 MED ORDER — LORAZEPAM 2 MG/ML IJ SOLN
0.5000 mg | Freq: Once | INTRAMUSCULAR | Status: AC
  Administered 2020-08-30: 0.5 mg via INTRAVENOUS

## 2020-08-30 MED ORDER — LORAZEPAM 2 MG/ML IJ SOLN
0.5000 mg | Freq: Once | INTRAMUSCULAR | Status: AC
Start: 2020-08-30 — End: 2020-08-30
  Administered 2020-08-30: 0.5 mg via INTRAVENOUS

## 2020-08-30 NOTE — Telephone Encounter (Signed)
Left message with follow-up appointment per 4/21 los. Gave option to call back to reschedule if needed.

## 2020-08-30 NOTE — Progress Notes (Signed)
Labs from today faxed to pt's PCP Dr Judeen Hammans at [phone] per pt request.

## 2020-08-30 NOTE — Telephone Encounter (Signed)
Spoke to pt prior to [facility] cycle 1. Informed pt that dexamethasone can cause insomnia as pt has been up since 3am this morning. No further questions voiced at this time.

## 2020-08-30 NOTE — Progress Notes (Signed)
Pt is approved for the $1000 [employer] grant.  

## 2020-08-30 NOTE — Patient Instructions (Signed)
Cyclophosphamide Injection What is this medicine? CYCLOPHOSPHAMIDE (sye kloe FOSS fa mide) is a chemotherapy drug. It slows the growth of cancer cells. This medicine is used to treat many types of cancer like lymphoma, myeloma, leukemia, breast cancer, and ovarian cancer, to name a few. This medicine may be used for other purposes; ask your health care provider or pharmacist if you have questions. COMMON BRAND NAME(S): Cytoxan, Neosar What should I tell my health care provider before I take this medicine? They need to know if you have any of these conditions:  heart disease  history of irregular heartbeat  infection  kidney disease  liver disease  low blood counts, like white cells, platelets, or red blood cells  on hemodialysis  recent or ongoing radiation therapy  scarring or thickening of the lungs  trouble passing urine  an unusual or allergic reaction to cyclophosphamide, other medicines, foods, dyes, or preservatives  pregnant or trying to get pregnant  breast-feeding How should I use this medicine? This drug is usually given as an injection into a vein or muscle or by infusion into a vein. It is administered in a hospital or clinic by a specially trained health care professional. Talk to your pediatrician regarding the use of this medicine in children. Special care may be needed. Overdosage: If you think you have taken too much of this medicine contact a poison control center or emergency room at once. NOTE: This medicine is only for you. Do not share this medicine with others. What if I miss a dose? It is important not to miss your dose. Call your doctor or health care professional if you are unable to keep an appointment. What may interact with this medicine?  amphotericin B  azathioprine  certain antivirals for HIV or hepatitis  certain medicines for blood pressure, heart disease, irregular heart beat  certain medicines that treat or prevent blood clots  like warfarin  certain other medicines for cancer  cyclosporine  etanercept  indomethacin  medicines that relax muscles for surgery  medicines to increase blood counts  metronidazole This list may not describe all possible interactions. Give your health care provider a list of all the medicines, herbs, non-prescription drugs, or dietary supplements you use. Also tell them if you smoke, drink alcohol, or use illegal drugs. Some items may interact with your medicine. What should I watch for while using this medicine? Your condition will be monitored carefully while you are receiving this medicine. You may need blood work done while you are taking this medicine. Drink water or other fluids as directed. Urinate often, even at night. Some products may contain alcohol. Ask your health care professional if this medicine contains alcohol. Be sure to tell all health care professionals you are taking this medicine. Certain medicines, like metronidazole and disulfiram, can cause an unpleasant reaction when taken with alcohol. The reaction includes flushing, headache, nausea, vomiting, sweating, and increased thirst. The reaction can last from 30 minutes to several hours. Do not become pregnant while taking this medicine or for 1 year after stopping it. Women should inform their health care professional if they wish to become pregnant or think they might be pregnant. Men should not father a child while taking this medicine and for 4 months after stopping it. There is potential for serious side effects to an unborn child. Talk to your health care professional for more information. Do not breast-feed an infant while taking this medicine or for 1 week after stopping it. This medicine has  caused ovarian failure in some women. This medicine may make it more difficult to get pregnant. Talk to your health care professional if you are concerned about your fertility. This medicine has caused decreased sperm  counts in some men. This may make it more difficult to father a child. Talk to your health care professional if you are concerned about your fertility. Call your health care professional for advice if you get a fever, chills, or sore throat, or other symptoms of a cold or flu. Do not treat yourself. This medicine decreases your body's ability to fight infections. Try to avoid being around people who are sick. Avoid taking medicines that contain aspirin, acetaminophen, ibuprofen, naproxen, or ketoprofen unless instructed by your health care professional. These medicines may hide a fever. Talk to your health care professional about your risk of cancer. You may be more at risk for certain types of cancer if you take this medicine. If you are going to need surgery or other procedure, tell your health care professional that you are using this medicine. Be careful brushing or flossing your teeth or using a toothpick because you may get an infection or bleed more easily. If you have any dental work done, tell your dentist you are receiving this medicine. What side effects may I notice from receiving this medicine? Side effects that you should report to your doctor or health care professional as soon as possible:  allergic reactions like skin rash, itching or hives, swelling of the face, lips, or tongue  breathing problems  nausea, vomiting  signs and symptoms of bleeding such as bloody or black, tarry stools; red or dark brown urine; spitting up blood or brown material that looks like coffee grounds; red spots on the skin; unusual bruising or bleeding from the eyes, gums, or nose  signs and symptoms of heart failure like fast, irregular heartbeat, sudden weight gain; swelling of the ankles, feet, hands  signs and symptoms of infection like fever; chills; cough; sore throat; pain or trouble passing urine  signs and symptoms of kidney injury like trouble passing urine or change in the amount of  urine  signs and symptoms of liver injury like dark yellow or brown urine; general ill feeling or flu-like symptoms; light-colored stools; loss of appetite; nausea; right upper belly pain; unusually weak or tired; yellowing of the eyes or skin Side effects that usually do not require medical attention (report to your doctor or health care professional if they continue or are bothersome):  confusion  decreased hearing  diarrhea  facial flushing  hair loss  headache  loss of appetite  missed menstrual periods  signs and symptoms of low red blood cells or anemia such as unusually weak or tired; feeling faint or lightheaded; falls  skin discoloration This list may not describe all possible side effects. Call your doctor for medical advice about side effects. You may report side effects to FDA at 1-800-FDA-1088. Where should I keep my medicine? This drug is given in a hospital or clinic and will not be stored at home. NOTE: This sheet is a summary. It may not cover all possible information. If you have questions about this medicine, talk to your doctor, pharmacist, or health care provider.  2021 Elsevier/Gold Standard (2019-01-31 09:53:29) Docetaxel injection What is this medicine? DOCETAXEL (doe se TAX el) is a chemotherapy drug. It targets fast dividing cells, like cancer cells, and causes these cells to die. This medicine is used to treat many types of cancers like  breast cancer, certain stomach cancers, head and neck cancer, lung cancer, and prostate cancer. This medicine may be used for other purposes; ask your health care provider or pharmacist if you have questions. COMMON BRAND NAME(S): Docefrez, Taxotere What should I tell my health care provider before I take this medicine? They need to know if you have any of these conditions:  infection (especially a virus infection such as chickenpox, cold sores, or herpes)  liver disease  low blood counts, like low white cell,  platelet, or red cell counts  an unusual or allergic reaction to docetaxel, polysorbate 80, other chemotherapy agents, other medicines, foods, dyes, or preservatives  pregnant or trying to get pregnant  breast-feeding How should I use this medicine? This drug is given as an infusion into a vein. It is administered in a hospital or clinic by a specially trained health care professional. Talk to your pediatrician regarding the use of this medicine in children. Special care may be needed. Overdosage: If you think you have taken too much of this medicine contact a poison control center or emergency room at once. NOTE: This medicine is only for you. Do not share this medicine with others. What if I miss a dose? It is important not to miss your dose. Call your doctor or health care professional if you are unable to keep an appointment. What may interact with this medicine? Do not take this medicine with any of the following medications:  live virus vaccines This medicine may also interact with the following medications:  aprepitant  certain antibiotics like erythromycin or clarithromycin  certain antivirals for HIV or hepatitis  certain medicines for fungal infections like fluconazole, itraconazole, ketoconazole, posaconazole, or voriconazole  cimetidine  ciprofloxacin  conivaptan  cyclosporine  dronedarone  fluvoxamine  grapefruit juice  imatinib  verapamil This list may not describe all possible interactions. Give your health care provider a list of all the medicines, herbs, non-prescription drugs, or dietary supplements you use. Also tell them if you smoke, drink alcohol, or use illegal drugs. Some items may interact with your medicine. What should I watch for while using this medicine? Your condition will be monitored carefully while you are receiving this medicine. You will need important blood work done while you are taking this medicine. Call your doctor or health  care professional for advice if you get a fever, chills or sore throat, or other symptoms of a cold or flu. Do not treat yourself. This drug decreases your body's ability to fight infections. Try to avoid being around people who are sick. Some products may contain alcohol. Ask your health care professional if this medicine contains alcohol. Be sure to tell all health care professionals you are taking this medicine. Certain medicines, like metronidazole and disulfiram, can cause an unpleasant reaction when taken with alcohol. The reaction includes flushing, headache, nausea, vomiting, sweating, and increased thirst. The reaction can last from 30 minutes to several hours. You may get drowsy or dizzy. Do not drive, use machinery, or do anything that needs mental alertness until you know how this medicine affects you. Do not stand or sit up quickly, especially if you are an older patient. This reduces the risk of dizzy or fainting spells. Alcohol may interfere with the effect of this medicine. Talk to your health care professional about your risk of cancer. You may be more at risk for certain types of cancer if you take this medicine. Do not become pregnant while taking this medicine or for 6  months after stopping it. Women should inform their doctor if they wish to become pregnant or think they might be pregnant. There is a potential for serious side effects to an unborn child. Talk to your health care professional or pharmacist for more information. Do not breast-feed an infant while taking this medicine or for 1 week after stopping it. Males who get this medicine must use a condom during sex with females who can get pregnant. If you get a woman pregnant, the baby could have birth defects. The baby could die before they are born. You will need to continue wearing a condom for 3 months after stopping the medicine. Tell your health care provider right away if your partner becomes pregnant while you are taking this  medicine. This may interfere with the ability to father a child. You should talk to your doctor or health care professional if you are concerned about your fertility. What side effects may I notice from receiving this medicine? Side effects that you should report to your doctor or health care professional as soon as possible:  allergic reactions like skin rash, itching or hives, swelling of the face, lips, or tongue  blurred vision  breathing problems  changes in vision  low blood counts - This drug may decrease the number of white blood cells, red blood cells and platelets. You may be at increased risk for infections and bleeding.  nausea and vomiting  pain, redness or irritation at site where injected  pain, tingling, numbness in the hands or feet  redness, blistering, peeling, or loosening of the skin, including inside the mouth  signs of decreased platelets or bleeding - bruising, pinpoint red spots on the skin, black, tarry stools, nosebleeds  signs of decreased red blood cells - unusually weak or tired, fainting spells, lightheadedness  signs of infection - fever or chills, cough, sore throat, pain or difficulty passing urine  swelling of the ankle, feet, hands Side effects that usually do not require medical attention (report to your doctor or health care professional if they continue or are bothersome):  constipation  diarrhea  fingernail or toenail changes  hair loss  loss of appetite  mouth sores  muscle pain This list may not describe all possible side effects. Call your doctor for medical advice about side effects. You may report side effects to FDA at 1-800-FDA-1088. Where should I keep my medicine? This drug is given in a hospital or clinic and will not be stored at home. NOTE: This sheet is a summary. It may not cover all possible information. If you have questions about this medicine, talk to your doctor, pharmacist, or health care provider.  2021  Elsevier/Gold Standard (2019-03-28 19:50:31)

## 2020-08-30 NOTE — Progress Notes (Signed)
Ok to treat with ALT of 101 per Truitt Merle, MD\  Pt was day 1 cycle 1 for taxotere and cytoxan, at the first rate for taxotere pt reported SOB, itchy throat and tingling her lips. Sandi Mealy, PA, was called, pepcid, benadryl and ativan was given along with NS bolus. Drug was then restarted. Pt again complained of a sore throat, an additional dose of ativan was given and pt finished treatment without complaints. VS remained stable throughout, pt discharged stable condition, ambulatory to lobby where daughter drove her home.

## 2020-08-31 ENCOUNTER — Telehealth: Payer: Self-pay

## 2020-08-31 NOTE — Telephone Encounter (Signed)
I spoke with [name].  She is taking decadron 4 mg twice daily.  She states that the swelling is almost gone and she is feeling better.

## 2020-08-31 NOTE — Telephone Encounter (Signed)
Received VM from patient stating that after taxotere reaction during yesterday's infusion, she has continued to have flushing on her chest, itchy throat, and slightly swollen upper lip. No breathing issues or chest pain reported. Advised patient to take 50mg  of benadryl and if that did not resolve symptoms, to go to the ED. Patient verbalized understanding. Will alert Dr Burr Medico.

## 2020-09-01 ENCOUNTER — Other Ambulatory Visit: Payer: Self-pay

## 2020-09-01 ENCOUNTER — Inpatient Hospital Stay: Payer: Medicare HMO

## 2020-09-01 VITALS — BP 154/77 | HR 78 | Temp 98.6°F | Resp 18

## 2020-09-01 DIAGNOSIS — G4730 Sleep apnea, unspecified: Secondary | ICD-10-CM | POA: Diagnosis not present

## 2020-09-01 DIAGNOSIS — E039 Hypothyroidism, unspecified: Secondary | ICD-10-CM | POA: Diagnosis not present

## 2020-09-01 DIAGNOSIS — Z5111 Encounter for antineoplastic chemotherapy: Secondary | ICD-10-CM | POA: Diagnosis not present

## 2020-09-01 DIAGNOSIS — E669 Obesity, unspecified: Secondary | ICD-10-CM | POA: Diagnosis not present

## 2020-09-01 DIAGNOSIS — M797 Fibromyalgia: Secondary | ICD-10-CM | POA: Diagnosis not present

## 2020-09-01 DIAGNOSIS — Z170 Estrogen receptor positive status [ER+]: Secondary | ICD-10-CM

## 2020-09-01 DIAGNOSIS — E785 Hyperlipidemia, unspecified: Secondary | ICD-10-CM | POA: Diagnosis not present

## 2020-09-01 DIAGNOSIS — Z5189 Encounter for other specified aftercare: Secondary | ICD-10-CM | POA: Diagnosis not present

## 2020-09-01 DIAGNOSIS — C50411 Malignant neoplasm of upper-outer quadrant of right female breast: Secondary | ICD-10-CM

## 2020-09-01 DIAGNOSIS — I10 Essential (primary) hypertension: Secondary | ICD-10-CM | POA: Diagnosis not present

## 2020-09-01 MED ORDER — PEGFILGRASTIM-CBQV 6 MG/0.6ML ~~LOC~~ SOSY
6.0000 mg | PREFILLED_SYRINGE | Freq: Once | SUBCUTANEOUS | Status: AC
  Administered 2020-09-01: 6 mg via SUBCUTANEOUS

## 2020-09-01 NOTE — Patient Instructions (Signed)
Pegfilgrastim injection What is this medicine? PEGFILGRASTIM (PEG fil gra stim) is a long-acting granulocyte colony-stimulating factor that stimulates the growth of neutrophils, a type of white blood cell important in the body's fight against infection. It is used to reduce the incidence of fever and infection in patients with certain types of cancer who are receiving chemotherapy that affects the bone marrow, and to increase survival after being exposed to high doses of radiation. This medicine may be used for other purposes; ask your health care provider or pharmacist if you have questions. COMMON BRAND NAME(S): Fulphila, Neulasta, Nyvepria, UDENYCA, Ziextenzo What should I tell my health care provider before I take this medicine? They need to know if you have any of these conditions:  kidney disease  latex allergy  ongoing radiation therapy  sickle cell disease  skin reactions to acrylic adhesives (On-Body Injector only)  an unusual or allergic reaction to pegfilgrastim, filgrastim, other medicines, foods, dyes, or preservatives  pregnant or trying to get pregnant  breast-feeding How should I use this medicine? This medicine is for injection under the skin. If you get this medicine at home, you will be taught how to prepare and give the pre-filled syringe or how to use the On-body Injector. Refer to the patient Instructions for Use for detailed instructions. Use exactly as directed. Tell your healthcare provider immediately if you suspect that the On-body Injector may not have performed as intended or if you suspect the use of the On-body Injector resulted in a missed or partial dose. It is important that you put your used needles and syringes in a special sharps container. Do not put them in a trash can. If you do not have a sharps container, call your pharmacist or healthcare provider to get one. Talk to your pediatrician regarding the use of this medicine in children. While this drug  may be prescribed for selected conditions, precautions do apply. Overdosage: If you think you have taken too much of this medicine contact a poison control center or emergency room at once. NOTE: This medicine is only for you. Do not share this medicine with others. What if I miss a dose? It is important not to miss your dose. Call your doctor or health care professional if you miss your dose. If you miss a dose due to an On-body Injector failure or leakage, a new dose should be administered as soon as possible using a single prefilled syringe for manual use. What may interact with this medicine? Interactions have not been studied. This list may not describe all possible interactions. Give your health care provider a list of all the medicines, herbs, non-prescription drugs, or dietary supplements you use. Also tell them if you smoke, drink alcohol, or use illegal drugs. Some items may interact with your medicine. What should I watch for while using this medicine? Your condition will be monitored carefully while you are receiving this medicine. You may need blood work done while you are taking this medicine. Talk to your health care provider about your risk of cancer. You may be more at risk for certain types of cancer if you take this medicine. If you are going to need a MRI, CT scan, or other procedure, tell your doctor that you are using this medicine (On-Body Injector only). What side effects may I notice from receiving this medicine? Side effects that you should report to your doctor or health care professional as soon as possible:  allergic reactions (skin rash, itching or hives, swelling of   the face, lips, or tongue)  back pain  dizziness  fever  pain, redness, or irritation at site where injected  pinpoint red spots on the skin  red or dark-brown urine  shortness of breath or breathing problems  stomach or side pain, or pain at the shoulder  swelling  tiredness  trouble  passing urine or change in the amount of urine  unusual bruising or bleeding Side effects that usually do not require medical attention (report to your doctor or health care professional if they continue or are bothersome):  bone pain  muscle pain This list may not describe all possible side effects. Call your doctor for medical advice about side effects. You may report side effects to FDA at 1-800-FDA-1088. Where should I keep my medicine? Keep out of the reach of children. If you are using this medicine at home, you will be instructed on how to store it. Throw away any unused medicine after the expiration date on the label. NOTE: This sheet is a summary. It may not cover all possible information. If you have questions about this medicine, talk to your doctor, pharmacist, or health care provider.  2021 Elsevier/Gold Standard (2019-05-20 13:20:51)  

## 2020-09-03 DIAGNOSIS — M531 Cervicobrachial syndrome: Secondary | ICD-10-CM | POA: Diagnosis not present

## 2020-09-03 DIAGNOSIS — M9902 Segmental and somatic dysfunction of thoracic region: Secondary | ICD-10-CM | POA: Diagnosis not present

## 2020-09-03 DIAGNOSIS — M9903 Segmental and somatic dysfunction of lumbar region: Secondary | ICD-10-CM | POA: Diagnosis not present

## 2020-09-03 DIAGNOSIS — R5381 Other malaise: Secondary | ICD-10-CM | POA: Diagnosis not present

## 2020-09-03 DIAGNOSIS — M9901 Segmental and somatic dysfunction of cervical region: Secondary | ICD-10-CM | POA: Diagnosis not present

## 2020-09-04 ENCOUNTER — Telehealth: Payer: Self-pay | Admitting: *Deleted

## 2020-09-04 NOTE — Telephone Encounter (Signed)
Pt called with c/o constipation. Discussed stool softeners recommended. Encouraged drinking water/non- caffinated beverages.  Pt has decided she does not want a port. Dr. Burr Medico and nurse notified.

## 2020-09-04 NOTE — Progress Notes (Signed)
DATE:  08/30/2020                                          X  CHEMO/IMMUNOTHERAPY REACTION          MD:  Dr. Truitt Merle   AGENT/BLOOD PRODUCT RECEIVING TODAY:               Docetaxel and cyclophosphamide   AGENT/BLOOD PRODUCT RECEIVING IMMEDIATELY PRIOR TO REACTION:           Docetaxel   VS: BP:      135/80   P:        89       SPO2:        100% on room air                BP:      163/69   P:        67       SPO2:        100% on room air     REACTION(S):           Constricted throat sensation of throat and tongue swelling and chest pressure   PREMEDS:      Decadron and Aloxi   INTERVENTION: Docetaxel was paused and the patient was given Pepcid 20 mg IV, Benadryl 25 mg IV, Ativan 0.5 mg IV x1.  Ativan 0.5 mg IV x1 was repeated after the restart of the patient's chemotherapy.   Review of Systems  Review of Systems  Constitutional: Negative for chills, diaphoresis and fever.  HENT: Negative for trouble swallowing and voice change.        Sensation of throat constriction, throat and tongue swelling.  Respiratory: Negative for cough, chest tightness, shortness of breath and wheezing.        Chest pressure  Cardiovascular: Negative for chest pain and palpitations.  Gastrointestinal: Negative for abdominal pain, constipation, diarrhea, nausea and vomiting.  Musculoskeletal: Negative for back pain and myalgias.  Neurological: Negative for dizziness, light-headedness and headaches.     Physical Exam  Physical Exam Constitutional:      General: She is not in acute distress.    Appearance: She is not diaphoretic.  HENT:     Head: Normocephalic and atraumatic.  Cardiovascular:     Rate and Rhythm: Normal rate and regular rhythm.     Heart sounds: Normal heart sounds. No murmur heard. No friction rub. No gallop.   Pulmonary:     Effort: Pulmonary effort is normal. No respiratory distress.     Breath sounds: Normal breath sounds. No wheezing or rales.  Skin:    General: Skin is  warm and dry.     Findings: No erythema or rash.  Neurological:     Mental Status: She is alert.     OUTCOME:                 The patient's symptoms abated and docetaxel was restarted.  She then had a recurrence of a sensation of constriction of her throat and was given Ativan 0.5 mg IV x1 for total dose of 1 mg of Ativan.  She was able to restart and complete her treatment with out any further issues or concerns.   Sandi Mealy, MHS, PA-C  This case was discussed with Dr. Burr Medico. She expressed agreement with my management of this patient.

## 2020-09-05 ENCOUNTER — Emergency Department (HOSPITAL_COMMUNITY): Payer: Medicare HMO

## 2020-09-05 ENCOUNTER — Telehealth: Payer: Self-pay

## 2020-09-05 ENCOUNTER — Other Ambulatory Visit: Payer: Self-pay

## 2020-09-05 ENCOUNTER — Encounter (HOSPITAL_COMMUNITY): Payer: Self-pay | Admitting: Emergency Medicine

## 2020-09-05 ENCOUNTER — Emergency Department (HOSPITAL_COMMUNITY)
Admission: EM | Admit: 2020-09-05 | Discharge: 2020-09-05 | Disposition: A | Discharge status: Home / Self Care | Payer: Medicare HMO | Attending: Emergency Medicine | Admitting: Emergency Medicine

## 2020-09-05 DIAGNOSIS — D1809 Hemangioma of other sites: Secondary | ICD-10-CM | POA: Diagnosis not present

## 2020-09-05 DIAGNOSIS — Z85828 Personal history of other malignant neoplasm of skin: Secondary | ICD-10-CM | POA: Diagnosis not present

## 2020-09-05 DIAGNOSIS — R079 Chest pain, unspecified: Secondary | ICD-10-CM

## 2020-09-05 DIAGNOSIS — K5900 Constipation, unspecified: Secondary | ICD-10-CM | POA: Diagnosis not present

## 2020-09-05 DIAGNOSIS — E039 Hypothyroidism, unspecified: Secondary | ICD-10-CM | POA: Diagnosis not present

## 2020-09-05 DIAGNOSIS — I10 Essential (primary) hypertension: Secondary | ICD-10-CM | POA: Diagnosis not present

## 2020-09-05 DIAGNOSIS — R0789 Other chest pain: Secondary | ICD-10-CM | POA: Insufficient documentation

## 2020-09-05 DIAGNOSIS — K148 Other diseases of tongue: Secondary | ICD-10-CM | POA: Diagnosis not present

## 2020-09-05 DIAGNOSIS — C50411 Malignant neoplasm of upper-outer quadrant of right female breast: Secondary | ICD-10-CM | POA: Insufficient documentation

## 2020-09-05 DIAGNOSIS — Z743 Need for continuous supervision: Secondary | ICD-10-CM | POA: Diagnosis not present

## 2020-09-05 DIAGNOSIS — I700 Atherosclerosis of aorta: Secondary | ICD-10-CM | POA: Diagnosis not present

## 2020-09-05 DIAGNOSIS — R109 Unspecified abdominal pain: Secondary | ICD-10-CM | POA: Diagnosis not present

## 2020-09-05 DIAGNOSIS — R0602 Shortness of breath: Secondary | ICD-10-CM | POA: Diagnosis not present

## 2020-09-05 DIAGNOSIS — Z79899 Other long term (current) drug therapy: Secondary | ICD-10-CM | POA: Diagnosis not present

## 2020-09-05 DIAGNOSIS — N281 Cyst of kidney, acquired: Secondary | ICD-10-CM | POA: Diagnosis not present

## 2020-09-05 DIAGNOSIS — R0902 Hypoxemia: Secondary | ICD-10-CM | POA: Diagnosis not present

## 2020-09-05 DIAGNOSIS — L7682 Other postprocedural complications of skin and subcutaneous tissue: Secondary | ICD-10-CM | POA: Diagnosis not present

## 2020-09-05 DIAGNOSIS — M25519 Pain in unspecified shoulder: Secondary | ICD-10-CM | POA: Diagnosis not present

## 2020-09-05 DIAGNOSIS — R220 Localized swelling, mass and lump, head: Secondary | ICD-10-CM

## 2020-09-05 DIAGNOSIS — Z853 Personal history of malignant neoplasm of breast: Secondary | ICD-10-CM | POA: Diagnosis not present

## 2020-09-05 DIAGNOSIS — M549 Dorsalgia, unspecified: Secondary | ICD-10-CM | POA: Diagnosis not present

## 2020-09-05 LAB — CBC WITH DIFFERENTIAL/PLATELET
Abs Immature Granulocytes: 0.99 10*3/uL — ABNORMAL HIGH (ref 0.00–0.07)
Basophils Absolute: 0.1 10*3/uL (ref 0.0–0.1)
Basophils Relative: 1 %
Eosinophils Absolute: 0.1 10*3/uL (ref 0.0–0.5)
Eosinophils Relative: 2 %
HCT: 38.9 % (ref 36.0–46.0)
Hemoglobin: 13.1 g/dL (ref 12.0–15.0)
Immature Granulocytes: 20 %
Lymphocytes Relative: 47 %
Lymphs Abs: 2.3 10*3/uL (ref 0.7–4.0)
MCH: 30.8 pg (ref 26.0–34.0)
MCHC: 33.7 g/dL (ref 30.0–36.0)
MCV: 91.5 fL (ref 80.0–100.0)
Monocytes Absolute: 0.7 10*3/uL (ref 0.1–1.0)
Monocytes Relative: 13 %
Neutro Abs: 0.8 10*3/uL — ABNORMAL LOW (ref 1.7–7.7)
Neutrophils Relative %: 17 %
Platelets: 147 10*3/uL — ABNORMAL LOW (ref 150–400)
RBC: 4.25 MIL/uL (ref 3.87–5.11)
RDW: 13 % (ref 11.5–15.5)
WBC: 5 10*3/uL (ref 4.0–10.5)
nRBC: 0 % (ref 0.0–0.2)

## 2020-09-05 LAB — I-STAT CHEM 8, ED
BUN: 16 mg/dL (ref 8–23)
Calcium, Ion: 1.2 mmol/L (ref 1.15–1.40)
Chloride: 103 mmol/L (ref 98–111)
Creatinine, Ser: 0.7 mg/dL (ref 0.44–1.00)
Glucose, Bld: 90 mg/dL (ref 70–99)
HCT: 38 % (ref 36.0–46.0)
Hemoglobin: 12.9 g/dL (ref 12.0–15.0)
Potassium: 4.4 mmol/L (ref 3.5–5.1)
Sodium: 138 mmol/L (ref 135–145)
TCO2: 26 mmol/L (ref 22–32)

## 2020-09-05 LAB — COMPREHENSIVE METABOLIC PANEL
ALT: 65 U/L — ABNORMAL HIGH (ref 0–44)
AST: 36 U/L (ref 15–41)
Albumin: 3.9 g/dL (ref 3.5–5.0)
Alkaline Phosphatase: 112 U/L (ref 38–126)
Anion gap: 8 (ref 5–15)
BUN: 17 mg/dL (ref 8–23)
CO2: 26 mmol/L (ref 22–32)
Calcium: 9.1 mg/dL (ref 8.9–10.3)
Chloride: 104 mmol/L (ref 98–111)
Creatinine, Ser: 0.66 mg/dL (ref 0.44–1.00)
GFR, Estimated: >60 mL/min (ref >60)
Glucose, Bld: 96 mg/dL (ref 70–99)
Potassium: 4.4 mmol/L (ref 3.5–5.1)
Sodium: 138 mmol/L (ref 135–145)
Total Bilirubin: 0.9 mg/dL (ref 0.3–1.2)
Total Protein: 6.5 g/dL (ref 6.5–8.1)

## 2020-09-05 LAB — TROPONIN I (HIGH SENSITIVITY): Troponin I (High Sensitivity): 4 ng/L (ref <18)

## 2020-09-05 LAB — LIPASE, BLOOD: Lipase: 24 U/L (ref 11–51)

## 2020-09-05 MED ORDER — DIPHENHYDRAMINE HCL 50 MG/ML IJ SOLN
50.0000 mg | Freq: Once | INTRAMUSCULAR | Status: AC
  Administered 2020-09-05: 50 mg via INTRAVENOUS
  Filled 2020-09-05: qty 1

## 2020-09-05 MED ORDER — IOHEXOL 350 MG/ML SOLN
100.0000 mL | Freq: Once | INTRAVENOUS | Status: AC | PRN
  Administered 2020-09-05: 100 mL via INTRAVENOUS

## 2020-09-05 MED ORDER — SODIUM CHLORIDE 0.9 % IV BOLUS
1000.0000 mL | Freq: Once | INTRAVENOUS | Status: AC
  Administered 2020-09-05: 1000 mL via INTRAVENOUS

## 2020-09-05 MED ORDER — MORPHINE SULFATE (PF) 4 MG/ML IV SOLN
4.0000 mg | Freq: Once | INTRAVENOUS | Status: AC
  Administered 2020-09-05: 4 mg via INTRAVENOUS
  Filled 2020-09-05: qty 1

## 2020-09-05 NOTE — Discharge Instructions (Signed)
Your workup today is unremarkable. In particular, your CT scan showed constipation and your heart function is normal   See your oncologist tomorrow to discuss about constipation and your symptoms and pain control   Return to ER if you have worse chest pain, abdominal pain, tongue swelling.

## 2020-09-05 NOTE — ED Provider Notes (Signed)
[facility] DEPT Provider Note   CSN: [id number] Arrival date & time: 09/05/20  1838     History No chief complaint on file.   [name] is a 65 y.o. female history of breast cancer on chemotherapy here presenting with left-sided chest pain, tongue swelling.  Patient states that she received chemotherapy (Docetaxel) on 4/21 and then pegfilgrastim injection on 4/23.  She has been having intermittent back pain and also left-sided chest pain and scapular pain for the last 2 days.  She states that the pain got worse today while she was driving.  She initially thought she had tongue swelling.  She denies any rash.  She was given Benadryl prior to her infusion.  She is not currently on any steroids.  She has no new meds.  When she had the swelling she also has worsening left-sided chest pain and back pain.  She states that she is constipated and is taking MiraLAX.  The history is provided by the patient.       Past Medical History:  Diagnosis Date  . Anal fissure   . Atrial septal aneurysm / if pfo  echo 6 13  10/23/2011  . Breast cancer ([place])   . Cataract    both eyes  . Depression   . Fatty liver    "pre fatty liver"  . Fibromyalgia   . Headache(784.0)    hx of migraines when younger  . Heart palpitations    hx with normal holter event monitoring  . Hx: UTI (urinary tract infection)   . Hyperlipidemia   . Hypertension   . Hypothyroidism   . Obesity   . Pneumonia 1972   hx of  . Polyp of colon   . 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 Skin cancer    basal, squamous cell  . Sleep apnea    uses cpap    Patient Active Problem List   Diagnosis Date Noted  . Malignant neoplasm of upper-outer quadrant of right breast in female, estrogen receptor positive ([place]) 06/25/2020  . 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 Hepatic steatosis 11/27/2014  . Hx of adenomatous colonic polyps 11/27/2014  . Visit for preventive  health examination 03/28/2014  . Hyperlipidemia 03/28/2014  . Left rotator cuff tear 07/07/2013  . Tear of left rotator cuff 07/07/2013  . Rhinitis 06/01/2013  . Chest wall deformity 04/12/2012  . Atrial septal aneurysm / if pfo  echo 6 13  10/23/2011  . OSA (obstructive sleep apnea) 11/27/2010  . Dyslipidemia 11/01/2010  . Flushing 08/29/2010  . Labile hypertension 08/29/2010  . MUSCLE CRAMPS, FOOT 06/10/2010  . OTHER SLEEP DISTURBANCES 06/10/2010  . ANXIETY, SITUATIONAL 05/08/2010  . VERTIGO, POSITIONAL 03/15/2010  . VITAMIN D DEFICIENCY 02/18/2010  . OBESITY 02/18/2010  . ALLERGIC RHINITIS 02/18/2010  . PLANTAR FASCIITIS 02/18/2010  . TWITCHING 06/11/2009  . NUMBNESS, HAND 06/11/2009  . CERVICAL STRAIN, ACUTE 06/11/2009  . OTHER MALAISE AND FATIGUE 07/09/2007  . HYPERTENSION 01/28/2007  . Hypothyroidism 11/11/2006  . COMMON MIGRAINE 11/11/2006  . GERD 11/11/2006  . FIBROMYALGIA 11/11/2006    Past Surgical History:  Procedure Laterality Date  . BREAST LUMPECTOMY WITH RADIOACTIVE SEED AND SENTINEL LYMPH NODE BIOPSY Right 07/26/2020   Procedure: RIGHT BREAST LUMPECTOMY WITH RADIOACTIVE SEED AND SENTINEL LYMPH NODE BIOPSY;  Surgeon: Jovita Kussmaul, MD;  Location: [facility];  Service: General;  Laterality: Right;  . BREAST LUMPECTOMY WITH RADIOACTIVE SEED LOCALIZATION Left 07/26/2020  Procedure: LEFT BREAST LUMPECTOMY X 2  WITH RADIOACTIVE SEED LOCALIZATION;  Surgeon: Jovita Kussmaul, MD;  Location: [facility];  Service: General;  Laterality: Left;  . COLONOSCOPY    . DILATION AND CURETTAGE OF UTERUS  1978  . [facility]  . KNEE ARTHROSCOPY     both in past  . POLYPECTOMY    . SHOULDER OPEN ROTATOR CUFF REPAIR Left 07/07/2013   Procedure: LEFT SHOULDER MINI OPEN SUBACROMIAL DECOMPRESSION ROTATOR CUFF REPAIR AND POSSIBLE PATCH GRAFT ;  Surgeon: Johnn Hai, MD;  Location: [facility];  Service: Orthopedics;  Laterality: Left;  with interscaline block  . SKIN CANCER  EXCISION Bilateral    arm, legs, and chest  . TONSILLECTOMY       OB History    Gravida  8   Para  6   Term  0   Preterm  0   AB  1   Living  6     SAB  0   IAB  0   Ectopic  1   Multiple  0   Live Births              Family History  Problem Relation Age of Onset  . Hypertension Mother        low borderline  . Osteoporosis Mother   . Hypertension Father   . Liver disease Father        amyloid deceased  . Hyperlipidemia Father   . Melanoma Sister   . Cancer Sister        melanoma   . Juvenile Diabetes Daughter   . ADD / ADHD Child   . Hyperlipidemia Other        Maternal grandmother  . Colon cancer Neg Hx   . Stomach cancer Neg Hx   . Colon polyps Neg Hx   . Esophageal cancer Neg Hx   . Rectal cancer Neg Hx     Social History   Tobacco Use  . Smoking status: Never Smoker  . Smokeless tobacco: Never Used  Vaping Use  . Vaping Use: Never used  Substance Use Topics  . Alcohol use: Yes    Comment: rare  . Drug use: No    Home Medications Prior to Admission medications   Medication Sig Start Date End Date Taking? Authorizing Provider  Ascorbic Acid (VITAMIN C) 1000 MG tablet Take 1,000 mg by mouth daily.    [provider]  b complex vitamins capsule Take 1 capsule by mouth daily.    [provider]  Bioflavonoid Products (QUERCETIN COMPLEX IMMUNE PO) Take 1 capsule by mouth daily.    [provider]  Cholecalciferol (VITAMIN D) 50 MCG (2000 UT) tablet Take 4,000 Units by mouth daily.    [provider]  dexamethasone (DECADRON) 4 MG tablet Take 2 tablets (8 mg total) by mouth 2 (two) times daily. Start the day before Taxotere. Then again the day after chemo for 3 days. 08/17/20   Truitt Merle, MD  hydrochlorothiazide (HYDRODIURIL) 25 MG tablet TAKE 1 TABLET BY MOUTH EVERY DAY Patient taking differently: Take 25 mg by mouth daily. 04/23/20   Panosh, Standley Brooking, MD  hydroxychloroquine (PLAQUENIL) 200 MG tablet Take  200 mg by mouth 2 (two) times a week.    [provider]  lidocaine-prilocaine (EMLA) cream Apply to affected area once 08/17/20   Truitt Merle, MD  LORazepam (ATIVAN) 1 MG tablet Take 0.5-1 tablets (0.5-1 mg total) by mouth every  8 (eight) hours as needed for anxiety. anxiety 05/19/18   Panosh, Standley Brooking, MD  MAGNESIUM CARBONATE PO Take 1 tablet by mouth daily.    [provider]  melatonin 5 MG TABS Take 5 mg by mouth at bedtime.    [provider]  Menaquinone-7 (VITAMIN K2) 100 MCG CAPS Take 100 mcg by mouth daily.    [provider]  metoprolol succinate (TOPROL XL) 25 MG 24 hr tablet TAKE ONCE A DAY AS DIRECTED Patient taking differently: Take by mouth daily as needed (Blood pressure). TAKE ONCE A DAY AS DIRECTED 02/10/19   Fay Records, MD  Multiple Vitamin (MULTIVITAMIN) tablet Take 1 tablet by mouth daily.    [provider]  NON FORMULARY Take 48.6 mg by mouth See admin instructions. Thyroid desiccated (procine) sr 2 capsules 5 days a week, 3 capsules 2 days per week (Tues and Fri)    [provider]  OLIVE LEAF EXTRACT PO Take 1 capsule by mouth daily.    [provider]  ondansetron (ZOFRAN) 8 MG tablet Take 1 tablet (8 mg total) by mouth 2 (two) times daily as needed for refractory nausea / vomiting. Start on day 3 after chemo. 08/17/20   Truitt Merle, MD  OVER THE COUNTER MEDICATION Take 2 capsules by mouth in the morning and at bedtime. Ifnla Med    [provider]  OVER THE COUNTER MEDICATION Take 1 capsule by mouth daily. Essential Pro    [provider]  OVER THE COUNTER MEDICATION Take 1 capsule by mouth daily. 5 HTP    [provider]  potassium chloride SA (KLOR-CON) 20 MEQ tablet TAKE ONE TABLET BY MOUTH ONE TIME DAILY 08/08/20   Panosh, Standley Brooking, MD  Probiotic Product (PROBIOTIC PO) Take 1 capsule by mouth daily.    [provider]  prochlorperazine (COMPAZINE) 10 MG tablet Take 1 tablet (10 mg  total) by mouth every 6 (six) hours as needed (Nausea or vomiting). 08/17/20   Truitt Merle, MD  traMADol (ULTRAM) 50 MG tablet Take 1-2 tablets (50-100 mg total) by mouth every 6 (six) hours as needed. Patient not taking: Reported on 08/09/2020 07/26/20   Autumn Messing III, MD  VITAMIN E PO Take 23 mg by mouth daily.    [provider]  ZINC GLUCONATE PO Take 10 mg by mouth daily.    [provider]    Allergies    Penicillins, Cefdinir, Norvasc [amlodipine besylate], Cetirizine, Irbesartan, Molds & smuts, and Tylenol [acetaminophen]  Review of Systems   Review of Systems  Cardiovascular: Positive for chest pain.  All other systems reviewed and are negative.   Physical Exam Updated Vital Signs BP (!) 165/80   Pulse 87   Temp 98.5 F (36.9 C) (Oral)   Resp 18   LMP  (LMP Unknown)   SpO2 100%   Physical Exam Vitals and nursing note reviewed.  Constitutional:      Comments: Uncomfortable  HENT:     Head: Normocephalic.     Nose: Nose normal.     Mouth/Throat:     Mouth: Mucous membranes are dry.     Comments: No obvious tongue swelling, posterior pharynx is clear Eyes:     Extraocular Movements: Extraocular movements intact.     Pupils: Pupils are equal, round, and reactive to light.  Cardiovascular:     Rate and Rhythm: Normal rate and regular rhythm.     Pulses: Normal pulses.     Heart  sounds: Normal heart sounds.  Pulmonary:     Effort: Pulmonary effort is normal.     Breath sounds: Normal breath sounds.     Comments: Reproducible left chest wall and scapular tenderness.  Abdominal:     General: Abdomen is flat.     Palpations: Abdomen is soft.  Musculoskeletal:        General: Normal range of motion.     Cervical back: Normal range of motion and neck supple.  Skin:    General: Skin is warm.     Capillary Refill: Capillary refill takes less than 2 seconds.  Neurological:     General: No focal deficit present.     Mental Status: She is oriented to  person, place, and time.     Comments: Cranial nerves II to XII is intact.  Psychiatric:        Mood and Affect: Mood normal.        Behavior: Behavior normal.     ED Results / Procedures / Treatments   Labs (all labs ordered are listed, but only abnormal results are displayed) Labs Reviewed  CBC WITH DIFFERENTIAL/PLATELET - Abnormal; Notable for the following components:      Result Value   Platelets 147 (*)    Neutro Abs 0.8 (*)    Abs Immature Granulocytes 0.99 (*)    All other components within normal limits  COMPREHENSIVE METABOLIC PANEL - Abnormal; Notable for the following components:   ALT 65 (*)    All other components within normal limits  LIPASE, BLOOD  I-STAT CHEM 8, ED  TROPONIN I (HIGH SENSITIVITY)    EKG EKG Interpretation  Date/Time:  Wednesday September 05 2020 19:57:51 EDT Ventricular Rate:  85 PR Interval:  182 QRS Duration: 102 QT Interval:  402 QTC Calculation: 478 R Axis:   -49 Text Interpretation: poor baseline, likely sinus vs aflutter Left anterior fascicular block ST & T wave abnormality, consider inferior ischemia Prolonged QT Abnormal ECG Confirmed by Wandra Arthurs [id number]) on 09/05/2020 8:01:33 PM   Radiology DG Chest Port 1 View  Result Date: 09/05/2020 CLINICAL DATA:  Chest pain.  Upper abdominal pain. EXAM: PORTABLE CHEST 1 VIEW COMPARISON:  Chest abdomen pelvis CT 08/20/2020 FINDINGS: The cardiomediastinal contours are normal. The lungs are clear. Pulmonary vasculature is normal. No consolidation, pleural effusion, or pneumothorax. No acute osseous abnormalities are seen. Surgical clips in the right axilla. IMPRESSION: No acute chest findings. Electronically Signed   By: Keith Rake M.D.   On: 09/05/2020 20:39   DG Abd Portable 1 View  Result Date: 09/05/2020 CLINICAL DATA:  Abdominal pain. EXAM: PORTABLE ABDOMEN - 1 VIEW COMPARISON:  Chest abdomen pelvis CT 08/20/2020 FINDINGS: No evidence of free intra-abdominal air. Moderate to large  colonic stool burden. No small bowel dilatation or obstruction no radiopaque calculi. No acute osseous abnormalities are seen. IMPRESSION: Moderate to large colonic stool burden.  No obstruction Electronically Signed   By: Keith Rake M.D.   On: 09/05/2020 20:40   CT Angio Chest/Abd/Pel for Dissection W and/or Wo Contrast  Result Date: 09/05/2020 CLINICAL DATA:  65 year old female with abdominal pain. Concern for aortic dissection. History of breast cancer. EXAM: CT ANGIOGRAPHY CHEST, ABDOMEN AND PELVIS TECHNIQUE: Non-contrast CT of the chest was initially obtained. Multidetector CT imaging through the chest, abdomen and pelvis was performed using the standard protocol during bolus administration of intravenous contrast. Multiplanar reconstructed images and MIPs were obtained and reviewed to evaluate the vascular anatomy. CONTRAST:  14mL OMNIPAQUE  IOHEXOL 350 MG/ML SOLN COMPARISON:  CT of the chest abdomen pelvis dated 08/20/2020. FINDINGS: CTA CHEST FINDINGS Cardiovascular: There is no cardiomegaly or pericardial effusion. The thoracic aorta is unremarkable. The origins of the great vessels of the aortic arch appear patent. The central pulmonary arteries are patent. Mediastinum/Nodes: No hilar or mediastinal adenopathy. The esophagus and the thyroid gland are grossly unremarkable. No mediastinal fluid collection. Lungs/Pleura: The lungs are clear. There is no pleural effusion or pneumothorax. The central airways are patent. Musculoskeletal: Osteopenia. T8 hemangioma. No acute osseous pathology. No suspicious bone lesions. Bilateral lumpectomy surgical clips as well as right axillary lymph node dissection. There is a 5.5 x 4.0 cm postoperative seroma in the right axilla similar to prior CT. Review of the MIP images confirms the above findings. CTA ABDOMEN AND PELVIS FINDINGS VASCULAR Aorta: Mild atherosclerotic calcification. No aneurysmal dilatation or dissection. No periaortic fluid collection Celiac:  Patent without evidence of aneurysm, dissection, vasculitis or significant stenosis. SMA: Patent without evidence of aneurysm, dissection, vasculitis or significant stenosis. Renals: Both renal arteries are patent without evidence of aneurysm, dissection, vasculitis, fibromuscular dysplasia or significant stenosis. IMA: Patent without evidence of aneurysm, dissection, vasculitis or significant stenosis. Inflow: Mild atherosclerotic calcification. No aneurysmal dilatation or dissection Veins: No obvious venous abnormality within the limitations of this arterial phase study. Review of the MIP images confirms the above findings. NON-VASCULAR No intra-abdominal free air or free fluid. Hepatobiliary: No focal liver abnormality is seen. No gallstones, gallbladder wall thickening, or biliary dilatation. Pancreas: Unremarkable. No pancreatic ductal dilatation or surrounding inflammatory changes. Spleen: Normal in size without focal abnormality. Adrenals/Urinary Tract: The adrenal glands are unremarkable. There is a 2 cm left renal inferior pole exophytic cyst. There is no hydronephrosis on either side. The visualized ureters and urinary bladder unremarkable. Stomach/Bowel: There is moderate stool throughout the colon. There is no bowel obstruction or active inflammation. The appendix is normal. Lymphatic: No adenopathy. Reproductive: The uterus and ovaries are grossly unremarkable. No adnexal masses. Other: None Musculoskeletal: Osteopenia. No acute osseous pathology. Review of the MIP images confirms the above findings. IMPRESSION: 1. No acute intrathoracic, abdominal, or pelvic pathology. No CT evidence of aortic dissection or aneurysm. 2. Postsurgical seroma in the right axilla similar to prior CT. Electronically Signed   By: Anner Crete M.D.   On: 09/05/2020 21:46    Procedures Procedures   Medications Ordered in ED Medications  sodium chloride 0.9 % bolus 1,000 mL (0 mLs Intravenous Stopped 09/05/20 2154)   morphine 4 MG/ML injection 4 mg (4 mg Intravenous Given 09/05/20 2039)  diphenhydrAMINE (BENADRYL) injection 50 mg (50 mg Intravenous Given 09/05/20 2037)  iohexol (OMNIPAQUE) 350 MG/ML injection 100 mL (100 mLs Intravenous Contrast Given 09/05/20 2118)    ED Course  I have reviewed the triage vital signs and the nursing notes.  Pertinent labs & imaging results that were available during my care of the patient were reviewed by me and considered in my medical decision making (see chart for details).    MDM Rules/Calculators/A&P                         DUSTINA SCOGGIN is a 65 y.o. female who presenting with initial concern for possible reaction to her infusion.  She was concerned for tongue swelling but not seeing any objective signs of tongue swelling and her oropharynx is clear.  She has no stridor or rash to suggest allergic reaction.  However she is telling  me she has chest pain and left scapular pain and back pain.  She is also hypertensive.  She appears to be writhing around in pain.  I am concerned for possible dissection versus recurrence of her cancer versus PE.  Will get dissection study.  Will give pain medicine and Benadryl.  Will hold off on steroids right now   10:03 PM Patient's trop is negative. Labs unremarkable. Has constipation on xray. Patient has oncology follow up tomorrow. Felt better with benadryl. No need for steroids right now.   Final Clinical Impression(s) / ED Diagnoses Final diagnoses:  None    Rx / DC Orders ED Discharge Orders    None       Drenda Freeze, MD 09/05/20 2205

## 2020-09-05 NOTE — ED Triage Notes (Signed)
BIBA Per EMS:  Pt coming from side of road where she pulled off due to feeling like she was having a reaction to her infusion.  Pt gets bone marrow infusions. Last infusion Saturday. Pt states she believes she is having a reaction to the infusion due to her mouth feeling "numb and swollen" EMS states pt has no labored breathing and lung sounds clear.  Vitals:  160/80  70  100% RA 17 RR  18 L AC

## 2020-09-05 NOTE — Progress Notes (Addendum)
[place]   [phone] [phone]   Clinic Follow up Note   Patient Care Team: Panosh, Standley Brooking, MD as PCP - General Fay Records, MD as PCP - Cardiology (Cardiology) Clance, Armando Reichert, MD (Pulmonary Disease) Arvella Nigh, MD as Consulting Physician (Obstetrics and Gynecology) Dr Peggye Form, MD as Consulting Physician (Orthopedic Surgery) Ladene Artist, MD as Consulting Physician (Gastroenterology) Melina Schools, MD as Consulting Physician (Orthopedic Surgery) Rockwell Germany, RN as Oncology Nurse Navigator Mauro Kaufmann, RN as Oncology Nurse Navigator Jovita Kussmaul, MD as Consulting Physician (General Surgery) Truitt Merle, MD as Consulting Physician (Hematology) Eppie Gibson, MD as Attending Physician (Radiation Oncology) 09/06/2020  CHIEF COMPLAINT: 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, chemo toxicity check  SUMMARY OF ONCOLOGIC HISTORY: Oncology History Overview Note  Cancer Staging Ductal carcinoma in situ (DCIS) of left breast Staging form: Breast, AJCC 8th Edition - Clinical stage from 06/21/2020: Stage 0 (cTis (DCIS), cN0, cM0, G2, ER+, PR+, HER2: Not Assessed) - Signed by Truitt Merle, MD on 06/26/2020 Stage prefix: Initial diagnosis  Malignant neoplasm of upper-outer quadrant of right breast in female, estrogen receptor positive ([place]) Staging form: Breast, AJCC 8th Edition - Clinical stage from 06/21/2020: Stage IA (cT1b, cN0, cM0, G2, ER+, PR+, HER2-) - Signed by Truitt Merle, MD on 06/26/2020 Stage prefix: Initial diagnosis - Pathologic stage from 07/26/2020: Stage IA (pT1c, pN1a, cM0, G2, ER+, PR+, HER2-) - Signed by Truitt Merle, MD on 08/09/2020 Stage prefix: Initial diagnosis Nuclear grade: G2 Histologic grading system: 3 grade system Residual tumor (R): R0 - None    Malignant neoplasm of upper-outer quadrant of right breast in female, estrogen receptor positive ([place])  06/08/2020 Mammogram   IMPRESSION: 1. 9 x 7 x  6 mm mass in the 12 o'clock position of the right breast, 2cmfn with imaging features highly suspicious for malignancy. 2. 4 mm group of indeterminate calcifications in the 12 o'clock position of the left breast and 4 mm group of indeterminate calcifications in the 1 o'clock position of the left breast. Together, the groups span an area measuring 3.9 cm.   06/21/2020 Cancer Staging   Staging form: Breast, AJCC 8th Edition - Clinical stage from 06/21/2020: Stage IA (cT1b, cN0, cM0, G2, ER+, PR+, HER2-) - Signed by Truitt Merle, MD on 06/26/2020 Stage prefix: Initial diagnosis   06/21/2020 Initial Biopsy   Diagnosis 1. Breast, right, needle core biopsy, 12 oc - INVASIVE MAMMARY CARCINOMA - MAMMARY CARCINOMA IN SITU - SEE COMMENT 2. Breast, left, needle core biopsy, 12 oc - MAMMARY CARCINOMA IN-SITU WITH NECROSIS AND CALCIFICATIONS - SEE COMMENT 3. Breast, left, needle core biopsy, 1 oc - MAMMARY CARCINOMA IN-SITU WITH NECROSIS AND CALCIFICATIONS - SEE COMMENT Microscopic Comment 1. The biopsy material shows an infiltrative proliferation of cells with large vesicular nuclei with inconspicuous nucleoli, arranged linearly and in small clusters. Based on the biopsy, the carcinoma appears Nottingham grade 2 of 3 and measures 0.8 cm in greatest linear extent. E-cadherin and prognostic markers (ER/PR/ki-67/HER2)are pending and will be reported in an addendum. Dr. Jeannie Done reviewed the case and agrees with the above diagnosis. These results were called to [facility] on June 22, 2020. 2. and 3. E-cadherin is pending and will be reported in an addendum.     1. E-cadherin is POSITIVE supporting a ductal origin. 2. E-cadherin is POSITIVE supporting a ductal origin. A prognostic panel (ER/PR) is pending and will be reported in  an addendum 3. E-cadherin is positive supporting a ductal origin. The focus is less pronounced on the deeper sections and in isolation would likely be  considered atypical ductal hyperplasia.   06/21/2020 Receptors her2   1. PROGNOSTIC INDICATORS Results: IMMUNOHISTOCHEMICAL AND MORPHOMETRIC ANALYSIS PERFORMED MANUALLY The tumor cells are EQUIVOCAL for Her2 (2+). HER2 by FISH will be performed and the results reported separately Estrogen Receptor: 95%, POSITIVE, STRONG STAINING INTENSITY Progesterone Receptor: 40%, POSITIVE, MODERATE STAINING INTENSITY Proliferation Marker Ki67: 10%  1. FLUORESCENCE IN-SITU HYBRIDIZATION Results: GROUP 5: HER2 **NEGATIVE** Equivocal form of amplification of the HER2 gene was detected in the IHC 2+ tissue sample received from this individual. HER2 FISH was performed by a technologist and cell imaging and analysis on the [employer].   06/25/2020 Initial Diagnosis   Malignant neoplasm of upper-outer quadrant of right breast in female, estrogen receptor positive ([place])   07/03/2020 Breast MRI   IMPRESSION: 1. Known RIGHT breast cancer, 12 o'clock axis, at anterior depth, measuring 1 cm greatest extent, manifesting as a spiculated enhancing mass on MRI, with associated biopsy clip. Expected post biopsy changes are seen within the adjacent outer RIGHT breast. 2. No evidence of additional multifocal or multicentric disease within the RIGHT breast. 3. Known LEFT breast DCIS within the slightly outer LEFT breast, at anterior depth, corresponding to the biopsy site labeled 1 o'clock axis, with associated enhancement only at the margins of the biopsy cavity measuring up to 5 mm greatest dimension. 4. Known LEFT breast DCIS within the upper central LEFT breast, at middle depth, corresponding to the biopsy site labeled 12 o'clock axis. Contiguous linear non-mass enhancement extends 2.3 cm superior-medial to the biopsy cavity, most likely post biopsy change but possibly contiguous extent of disease. 5. No evidence of additional multifocal or multicentric disease within the LEFT breast. 6. No evidence of  metastatic lymphadenopathy.     07/26/2020 Cancer Staging   Staging form: Breast, AJCC 8th Edition - Pathologic stage from 07/26/2020: Stage IA (pT1c, pN1a, cM0, G2, ER+, PR+, HER2-) - Signed by Truitt Merle, MD on 08/09/2020 Stage prefix: Initial diagnosis Nuclear grade: G2 Histologic grading system: 3 grade system Residual tumor (R): R0 - None   07/26/2020 Surgery   RIGHT BREAST LUMPECTOMY WITH RADIOACTIVE SEED AND SENTINEL LYMPH NODE BIOPSY by Dr Marlou Starks   07/26/2020 Pathology Results   FINAL MICROSCOPIC DIAGNOSIS:   A. LYMPH NODE, RIGHT AXILLARY #1, SENTINEL, EXCISION:  - Lymph node, negative for carcinoma (0/1)   B. LYMPH NODE, RIGHT AXILLARY, SENTINEL, EXCISION:  - Benign fibroadipose tissue, negative for carcinoma   C. BREAST, RIGHT, LUMPECTOMY:  - Invasive ductal carcinoma, 1.5 cm, grade 2  - Ductal carcinoma in situ, low grade  - Resection margins are negative for carcinoma; closest is the anterior  margin of 0.2 cm  - Biopsy site changes  - See oncology table   D. BREAST, LEFT, LUMPECTOMY:  - Benign breast parenchyma with prominent biopsy-related changes  - Negative for residual ductal carcinoma in situ  - See oncology table   E. LYMPH NODE, RIGHT AXILLARY #2, SENTINEL, EXCISION:  - Invasive ductal carcinoma, see comment     COMMENT:   E.  Lymph node tissue is not identified.  Findings likely represent an  entirely replaced lymph node with foci of extranodal extension.    07/26/2020 Miscellaneous   Mammaprint High Risk of Luminla Type B 29% risk of recurrence in 10 years if untreated.  Her Mammaprint index is -0.175 She has 94.6% benefit of chemotherapy  and hormaonal therapy.      08/20/2020 Imaging   CT C/A/P IMPRESSION: 1. No definitive imaging findings to suggest metastatic disease in the chest, abdomen or pelvis. 2. Postoperative changes of bilateral lumpectomy and right axillary lymph node dissection with what appears to be a large postoperative seroma  in the right axilla, as detailed above. Attention on follow-up studies is recommended to ensure the stability or regression of this collection. 3. Additional incidental findings, as above.   08/28/2020 Imaging   Bone Scan IMPRESSION: No definite scintigraphic evidence of osseous metastases.   08/30/2020 -  Chemotherapy    Patient is on Treatment Plan: BREAST TC Q21D      Ductal carcinoma in situ (DCIS) of left breast  06/08/2020 Mammogram   IMPRESSION: 1. 9 x 7 x 6 mm mass in the 12 o'clock position of the right breast, 2cmfn with imaging features highly suspicious for malignancy. 2. 4 mm group of indeterminate calcifications in the 12 o'clock position of the left breast and 4 mm group of indeterminate calcifications in the 1 o'clock position of the left breast. Together, the groups span an area measuring 3.9 cm.   06/21/2020 Cancer Staging   Staging form: Breast, AJCC 8th Edition - Clinical stage from 06/21/2020: Stage 0 (cTis (DCIS), cN0, cM0, G2, ER+, PR+, HER2: Not Assessed) - Signed by Truitt Merle, MD on 06/26/2020 Stage prefix: Initial diagnosis   06/21/2020 Initial Biopsy   Diagnosis 1. Breast, right, needle core biopsy, 12 oc - INVASIVE MAMMARY CARCINOMA - MAMMARY CARCINOMA IN SITU - SEE COMMENT 2. Breast, left, needle core biopsy, 12 oc - MAMMARY CARCINOMA IN-SITU WITH NECROSIS AND CALCIFICATIONS - SEE COMMENT 3. Breast, left, needle core biopsy, 1 oc - MAMMARY CARCINOMA IN-SITU WITH NECROSIS AND CALCIFICATIONS - SEE COMMENT Microscopic Comment 1. The biopsy material shows an infiltrative proliferation of cells with large vesicular nuclei with inconspicuous nucleoli, arranged linearly and in small clusters. Based on the biopsy, the carcinoma appears Nottingham grade 2 of 3 and measures 0.8 cm in greatest linear extent. E-cadherin and prognostic markers (ER/PR/ki-67/HER2)are pending and will be reported in an addendum. Dr. Jeannie Done reviewed the case and agrees with the  above diagnosis. These results were called to [facility] on June 22, 2020. 2. and 3. E-cadherin is pending and will be reported in an addendum.     1. E-cadherin is POSITIVE supporting a ductal origin. 2. E-cadherin is POSITIVE supporting a ductal origin. A prognostic panel (ER/PR) is pending and will be reported in an addendum 3. E-cadherin is positive supporting a ductal origin. The focus is less pronounced on the deeper sections and in isolation would likely be considered atypical ductal hyperplasia.   06/25/2020 Initial Diagnosis   Ductal carcinoma in situ (DCIS) of left breast    Receptors her2   2. PROGNOSTIC INDICATORS Results: IMMUNOHISTOCHEMICAL AND MORPHOMETRIC ANALYSIS PERFORMED MANUALLY Estrogen Receptor: 95%, POSITIVE, STRONG STAINING INTENSITY Progesterone Receptor: 30%, POSITIVE, STRONG STAINING INTENSITY   07/03/2020 Breast MRI   IMPRESSION: 1. Known RIGHT breast cancer, 12 o'clock axis, at anterior depth, measuring 1 cm greatest extent, manifesting as a spiculated enhancing mass on MRI, with associated biopsy clip. Expected post biopsy changes are seen within the adjacent outer RIGHT breast. 2. No evidence of additional multifocal or multicentric disease within the RIGHT breast. 3. Known LEFT breast DCIS within the slightly outer LEFT breast, at anterior depth, corresponding to the biopsy site labeled 1 o'clock axis, with associated enhancement only at the margins  of the biopsy cavity measuring up to 5 mm greatest dimension. 4. Known LEFT breast DCIS within the upper central LEFT breast, at middle depth, corresponding to the biopsy site labeled 12 o'clock axis. Contiguous linear non-mass enhancement extends 2.3 cm superior-medial to the biopsy cavity, most likely post biopsy change but possibly contiguous extent of disease. 5. No evidence of additional multifocal or multicentric disease within the LEFT breast. 6. No evidence of  metastatic lymphadenopathy.     07/26/2020 Surgery   LEFT BREAST LUMPECTOMY X 2  WITH RADIOACTIVE SEED LOCALIZATION by Dr Marlou Starks   07/26/2020 Pathology Results   FINAL MICROSCOPIC DIAGNOSIS:   A. LYMPH NODE, RIGHT AXILLARY #1, SENTINEL, EXCISION:  - Lymph node, negative for carcinoma (0/1)   B. LYMPH NODE, RIGHT AXILLARY, SENTINEL, EXCISION:  - Benign fibroadipose tissue, negative for carcinoma   C. BREAST, RIGHT, LUMPECTOMY:  - Invasive ductal carcinoma, 1.5 cm, grade 2  - Ductal carcinoma in situ, low grade  - Resection margins are negative for carcinoma; closest is the anterior  margin of 0.2 cm  - Biopsy site changes  - See oncology table   D. BREAST, LEFT, LUMPECTOMY:  - Benign breast parenchyma with prominent biopsy-related changes  - Negative for residual ductal carcinoma in situ  - See oncology table   E. LYMPH NODE, RIGHT AXILLARY #2, SENTINEL, EXCISION:  - Invasive ductal carcinoma, see comment     COMMENT:   E.  Lymph node tissue is not identified.  Findings likely represent an  entirely replaced lymph node with foci of extranodal extension.      CURRENT THERAPY: Starting Docetaxel and Cytoxan q3weeks for 4 cycles  INTERVAL HISTORY: [name] returns for follow-up as scheduled.  She received cycle 1 dose-reduced TC on 4/21.  Symptom management PA Sandi Mealy was called to infusion room during Taxotere for constricted throat sensation, tongue swelling, and chest pressure.  Infusion was stopped, she was treated with Pepcid, Benadryl, and Ativan x1.  Chemo was restarted when she recovered, but she had a recurrence of throat tightness and was given another 0.5 mg of Ativan.  She was able to complete treatment without any further issues.  On day 2 she still had mild oral/lip swelling that eventually resolved with  Benadryl and Decadron.  She received G-CSF on day 3 and took claritin.  On days 6 and 7 she had constipation with moderate back pain, constipation improved  with mag citrate but her pain worsened, triage nurse told her to go to ED for evaluation due to her difficulty walking and sternal pain. She also could not sleep well due to pain.  While in [facility] ED on 4/27 she reported severe back pain, left-sided chest pain, and tongue swelling.  Work-up showed normal WBC, Hg 13.1, platelet 147, ANC 0.8, ALT 65, normal troponins.  Chest x-ray was negative, abdominal x-ray showed large colonic stool burden, CTA negative for PE or dissection and no new findings.  Symptoms improved with NS, morphine, and Benadryl and she was discharged home.  She did not require much anti-emetics. She is eating and drinking well. Mouth feels "puffy" and raw but no sores. Constipation is managed. She had chills in ED last night after morphine, it was cold, no fever. Denies cough, chest pain, dyspnea. Back pain is improved.  Denies neuropathy.   MEDICAL HISTORY:  Past Medical History:  Diagnosis Date  . Anal fissure   . Atrial septal aneurysm / if pfo  echo 6 13  10/23/2011  .  Breast cancer ([place])   . Cataract    both eyes  . Depression   . Fatty liver    "pre fatty liver"  . Fibromyalgia   . Headache(784.0)    hx of migraines when younger  . Heart palpitations    hx with normal holter event monitoring  . Hx: UTI (urinary tract infection)   . Hyperlipidemia   . Hypertension   . Hypothyroidism   . Obesity   . Pneumonia 1972   hx of  . Polyp of colon   . 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 Skin cancer    basal, squamous cell  . Sleep apnea    uses cpap    SURGICAL HISTORY: Past Surgical History:  Procedure Laterality Date  . BREAST LUMPECTOMY WITH RADIOACTIVE SEED AND SENTINEL LYMPH NODE BIOPSY Right 07/26/2020   Procedure: RIGHT BREAST LUMPECTOMY WITH RADIOACTIVE SEED AND SENTINEL LYMPH NODE BIOPSY;  Surgeon: Jovita Kussmaul, MD;  Location: [facility];  Service: General;  Laterality: Right;  . BREAST LUMPECTOMY WITH RADIOACTIVE SEED LOCALIZATION Left 07/26/2020    Procedure: LEFT BREAST LUMPECTOMY X 2  WITH RADIOACTIVE SEED LOCALIZATION;  Surgeon: Jovita Kussmaul, MD;  Location: [facility];  Service: General;  Laterality: Left;  . COLONOSCOPY    . DILATION AND CURETTAGE OF UTERUS  1978  . [place]  . KNEE ARTHROSCOPY     both in past  . POLYPECTOMY    . SHOULDER OPEN ROTATOR CUFF REPAIR Left 07/07/2013   Procedure: LEFT SHOULDER MINI OPEN SUBACROMIAL DECOMPRESSION ROTATOR CUFF REPAIR AND POSSIBLE PATCH GRAFT ;  Surgeon: Johnn Hai, MD;  Location: [facility];  Service: Orthopedics;  Laterality: Left;  with interscaline block  . SKIN CANCER EXCISION Bilateral    arm, legs, and chest  . TONSILLECTOMY      I have reviewed the social history and family history with the patient and they are unchanged from previous note.  ALLERGIES:  is allergic to penicillins, cefdinir, norvasc [amlodipine besylate], cetirizine, irbesartan, molds & smuts, and tylenol [acetaminophen].  MEDICATIONS:  Current Outpatient Medications  Medication Sig Dispense Refill  . Ascorbic Acid (VITAMIN C) 1000 MG tablet Take 1,000 mg by mouth daily.    Marland Kitchen b complex vitamins capsule Take 1 capsule by mouth daily.    . Bioflavonoid Products (QUERCETIN COMPLEX IMMUNE PO) Take 1 capsule by mouth daily.    . Cholecalciferol (VITAMIN D) 50 MCG (2000 UT) tablet Take 4,000 Units by mouth daily.    Marland Kitchen dexamethasone (DECADRON) 4 MG tablet Take 2 tablets (8 mg total) by mouth 2 (two) times daily. Start the day before Taxotere. Then again the day after chemo for 3 days. 30 tablet 1  . hydrochlorothiazide (HYDRODIURIL) 25 MG tablet TAKE 1 TABLET BY MOUTH EVERY DAY (Patient taking differently: Take 25 mg by mouth daily.) 90 tablet 0  . hydroxychloroquine (PLAQUENIL) 200 MG tablet Take 200 mg by mouth 2 (two) times a week.    . lidocaine-prilocaine (EMLA) cream Apply to affected area once 30 g 3  . LORazepam (ATIVAN) 1 MG tablet Take 0.5-1 tablets (0.5-1 mg total) by mouth every 8  (eight) hours as needed for anxiety. anxiety 20 tablet 0  . MAGNESIUM CARBONATE PO Take 1 tablet by mouth daily.    . melatonin 5 MG TABS Take 5 mg by mouth at bedtime.    . Menaquinone-7 (VITAMIN K2) 100 MCG CAPS Take 100 mcg by mouth daily.    Marland Kitchen  metoprolol succinate (TOPROL XL) 25 MG 24 hr tablet TAKE ONCE A DAY AS DIRECTED (Patient taking differently: Take by mouth daily as needed (Blood pressure). TAKE ONCE A DAY AS DIRECTED) 30 tablet 1  . Multiple Vitamin (MULTIVITAMIN) tablet Take 1 tablet by mouth daily.    . NON FORMULARY Take 48.6 mg by mouth See admin instructions. Thyroid desiccated (procine) sr 2 capsules 5 days a week, 3 capsules 2 days per week (Tues and Fri)    . OLIVE LEAF EXTRACT PO Take 1 capsule by mouth daily.    . ondansetron (ZOFRAN) 8 MG tablet Take 1 tablet (8 mg total) by mouth 2 (two) times daily as needed for refractory nausea / vomiting. Start on day 3 after chemo. 30 tablet 1  . OVER THE COUNTER MEDICATION Take 2 capsules by mouth in the morning and at bedtime. Ifnla Med    . OVER THE COUNTER MEDICATION Take 1 capsule by mouth daily. Essential Pro    . OVER THE COUNTER MEDICATION Take 1 capsule by mouth daily. 5 HTP    . potassium chloride SA (KLOR-CON) 20 MEQ tablet TAKE ONE TABLET BY MOUTH ONE TIME DAILY 90 tablet 0  . Probiotic Product (PROBIOTIC PO) Take 1 capsule by mouth daily.    . prochlorperazine (COMPAZINE) 10 MG tablet Take 1 tablet (10 mg total) by mouth every 6 (six) hours as needed (Nausea or vomiting). 30 tablet 1  . traMADol (ULTRAM) 50 MG tablet Take 1-2 tablets (50-100 mg total) by mouth every 6 (six) hours as needed. (Patient not taking: Reported on 08/09/2020) 20 tablet 1  . VITAMIN E PO Take 23 mg by mouth daily.    Marland Kitchen ZINC GLUCONATE PO Take 10 mg by mouth daily.     No current facility-administered medications for this visit.    PHYSICAL EXAMINATION: ECOG PERFORMANCE STATUS: 1 - Symptomatic but completely ambulatory  Vitals:   09/06/20 1015   BP: 138/65  Pulse: (!) 109  Resp: 20  Temp: 97.7 F (36.5 C)  SpO2: 99%   Filed Weights   09/06/20 1015  Weight: 207 lb 9.6 oz (94.2 kg)    GENERAL:alert, no distress and comfortable SKIN: no rash. 2 ecchymoses to left anterior forearm  EYES:  sclera clear OROPHARYNX: No thrush, ulcers, or oropharyngeal edema LUNGS: clear, normal breathing effort HEART: regular rate & rhythm, mild bilateral lower extremity edema ABDOMEN:abdomen soft, non-tender and normal bowel sounds Musculoskeletal: Focal tenderness in low back and bilateral hips NEURO: alert & oriented x 3 with fluent speech, no focal motor/sensory deficits  LABORATORY DATA:  I have reviewed the data as listed CBC Latest Ref Rng & Units 09/05/2020 09/05/2020 08/30/2020  WBC 4.0 - 10.5 K/uL - 5.0 10.8(H)  Hemoglobin 12.0 - 15.0 g/dL 12.9 13.1 13.3  Hematocrit 36.0 - 46.0 % 38.0 38.9 39.6  Platelets 150 - 400 K/uL - 147(L) 219     CMP Latest Ref Rng & Units 09/05/2020 09/05/2020 08/30/2020  Glucose 70 - 99 mg/dL 90 96 130(H)  BUN 8 - 23 mg/dL _0 Creatinine 0.44 - 1.00 mg/dL 0.70 0.66 0.77  Sodium 135 - 145 mmol/L 138 138 141  Potassium 3.5 - 5.1 mmol/L 4.4 4.4 3.9  Chloride 98 - 111 mmol/L 103 104 105  CO2 22 - 32 mmol/L - 26 23  Calcium 8.9 - 10.3 mg/dL - 9.1 9.8  Total Protein 6.5 - 8.1 g/dL - 6.5 7.3  Total Bilirubin 0.3 - 1.2 mg/dL - 0.9 0.5  Alkaline Phos 38 - 126 U/L - 112 127(H)  AST 15 - 41 U/L - 36 78(H)  ALT 0 - 44 U/L - 65(H) 101(H)      RADIOGRAPHIC STUDIES: I have personally reviewed the radiological images as listed and agreed with the findings in the report. DG Chest Port 1 View  Result Date: 09/05/2020 CLINICAL DATA:  Chest pain.  Upper abdominal pain. EXAM: PORTABLE CHEST 1 VIEW COMPARISON:  Chest abdomen pelvis CT 08/20/2020 FINDINGS: The cardiomediastinal contours are normal. The lungs are clear. Pulmonary vasculature is normal. No consolidation, pleural effusion, or pneumothorax. No acute  osseous abnormalities are seen. Surgical clips in the right axilla. IMPRESSION: No acute chest findings. Electronically Signed   By: Keith Rake M.D.   On: 09/05/2020 20:39   DG Abd Portable 1 View  Result Date: 09/05/2020 CLINICAL DATA:  Abdominal pain. EXAM: PORTABLE ABDOMEN - 1 VIEW COMPARISON:  Chest abdomen pelvis CT 08/20/2020 FINDINGS: No evidence of free intra-abdominal air. Moderate to large colonic stool burden. No small bowel dilatation or obstruction no radiopaque calculi. No acute osseous abnormalities are seen. IMPRESSION: Moderate to large colonic stool burden.  No obstruction Electronically Signed   By: Keith Rake M.D.   On: 09/05/2020 20:40   CT Angio Chest/Abd/Pel for Dissection W and/or Wo Contrast  Result Date: 09/05/2020 CLINICAL DATA:  65 year old female with abdominal pain. Concern for aortic dissection. History of breast cancer. EXAM: CT ANGIOGRAPHY CHEST, ABDOMEN AND PELVIS TECHNIQUE: Non-contrast CT of the chest was initially obtained. Multidetector CT imaging through the chest, abdomen and pelvis was performed using the standard protocol during bolus administration of intravenous contrast. Multiplanar reconstructed images and MIPs were obtained and reviewed to evaluate the vascular anatomy. CONTRAST:  159m OMNIPAQUE IOHEXOL 350 MG/ML SOLN COMPARISON:  CT of the chest abdomen pelvis dated 08/20/2020. FINDINGS: CTA CHEST FINDINGS Cardiovascular: There is no cardiomegaly or pericardial effusion. The thoracic aorta is unremarkable. The origins of the great vessels of the aortic arch appear patent. The central pulmonary arteries are patent. Mediastinum/Nodes: No hilar or mediastinal adenopathy. The esophagus and the thyroid gland are grossly unremarkable. No mediastinal fluid collection. Lungs/Pleura: The lungs are clear. There is no pleural effusion or pneumothorax. The central airways are patent. Musculoskeletal: Osteopenia. T8 hemangioma. No acute osseous pathology. No  suspicious bone lesions. Bilateral lumpectomy surgical clips as well as right axillary lymph node dissection. There is a 5.5 x 4.0 cm postoperative seroma in the right axilla similar to prior CT. Review of the MIP images confirms the above findings. CTA ABDOMEN AND PELVIS FINDINGS VASCULAR Aorta: Mild atherosclerotic calcification. No aneurysmal dilatation or dissection. No periaortic fluid collection Celiac: Patent without evidence of aneurysm, dissection, vasculitis or significant stenosis. SMA: Patent without evidence of aneurysm, dissection, vasculitis or significant stenosis. Renals: Both renal arteries are patent without evidence of aneurysm, dissection, vasculitis, fibromuscular dysplasia or significant stenosis. IMA: Patent without evidence of aneurysm, dissection, vasculitis or significant stenosis. Inflow: Mild atherosclerotic calcification. No aneurysmal dilatation or dissection Veins: No obvious venous abnormality within the limitations of this arterial phase study. Review of the MIP images confirms the above findings. NON-VASCULAR No intra-abdominal free air or free fluid. Hepatobiliary: No focal liver abnormality is seen. No gallstones, gallbladder wall thickening, or biliary dilatation. Pancreas: Unremarkable. No pancreatic ductal dilatation or surrounding inflammatory changes. Spleen: Normal in size without focal abnormality. Adrenals/Urinary Tract: The adrenal glands are unremarkable. There is a 2 cm left renal inferior pole exophytic cyst. There is no hydronephrosis on either  side. The visualized ureters and urinary bladder unremarkable. Stomach/Bowel: There is moderate stool throughout the colon. There is no bowel obstruction or active inflammation. The appendix is normal. Lymphatic: No adenopathy. Reproductive: The uterus and ovaries are grossly unremarkable. No adnexal masses. Other: None Musculoskeletal: Osteopenia. No acute osseous pathology. Review of the MIP images confirms the above  findings. IMPRESSION: 1. No acute intrathoracic, abdominal, or pelvic pathology. No CT evidence of aortic dissection or aneurysm. 2. Postsurgical seroma in the right axilla similar to prior CT. Electronically Signed   By: Anner Crete M.D.   On: 09/05/2020 21:46     ASSESSMENT & PLAN: [name] is a 65 y.o. female with   1.Malignant neoplasm of upper-outer quadrant of right breast, StageIB,p(T1cN1aM0), ER+/PR+/HER2-, GradeIIANDLeftbreast DCIS, gradeII, ER+/PR+(removed by biopsy) -She was diagnosed in 06/2020.She was found to have b/l masses on mammogram. On biopsy she was seen to have2 small area ofDCIS inleft breast and 0.8cm right breast invasive ductal carcinoma. -She underwent B/l lumpectomy and right SNLB by Dr Marlou Starks on 07/26/20. Her surgical path showed her left breast DCIS was completely removed with biopsy and no residual DCIS on path. Her grade II, 1.5cm invasive ductal carcinoma of right breast was completely removed with clear margins and 1/2 positive LNs.  -Given positive LN, Mammaprint was done and results show high risk of Luminal Type B with 29% risk ofdistantrecurrence.She has 94.6%chance of 5-year recurrence free withchemotherapy andhormonaltherapy.  -Staging CT C/A/P on 08/20/20 and bone scan on 08/28/20 were negative for metastatic disease. -She began adjuvant docetaxel and Cytoxan with G-CSF on 08/30/2020, dose reduced by 15% for mild transaminitis  -She developed a chemo reaction to docetaxel, with flushing, throat tightness, and chest pressure.  Symptoms eventually resolved with medication and she was able to complete the infusion.  She otherwise tolerated chemo well.  Unfortunately she has severe bone pain from G-CSF that required ED visit -Ms. Somm[place] appears stable, she has recovered mostly well from chemo and severe pain from G-CSF.  I reviewed her labs, imaging, and ED work-up which is overall negative. -Patient was seen with Dr. Burr Medico who  discussed treatment options options including to stop chemo vs consider alternative chemo.  -We discussed weekly Taxol or abraxane (preferred) for 9 weeks vs abraxane plus cytoxan q3 weeks x3 cycles.  Dr. Burr Medico and Ms. Foulks agreed to Abraxane with Cytoxan.  We reviewed potential side effects.  The goal remains curative. -Will stop GCSF -Patient declined PAC  -She will return for lab, follow-up, and chemo in 2 weeks (first Abraxane and Cytoxan), no G-CSF, then nadir check 1 week later.  2. Comorbidities:Depression, Fibromyalgia, HLD, HTN, Hypothyroidism, H/o Skin cancer(squamous and basal cell), sleep apnea  -She notes having Adrenal fatigue, Dr. Burr Medico previously recommend she see endocrinologist -She is followed for HTN  3. Genetic testing, she will think about it.  4.  Fatty liver and transaminitis -She has chronic intermittent transaminitis, which she attributes to fatty liver.  She is on low sugar diet and drinks a lot of water -Liver morphology was unremarkable on recent CT scan -She does not drink alcohol, she knows to avoid Tylenol. -She had mild transaminitis on 08/30/2020, cycle 1 chemo dose reduced.  LFTs improved today   Plan: -Chemo SE's and recent ED work-up reviewed -Discontinue TC with G-CSF, change to Abraxane and Cytoxan every 3 weeks x3 cycles (no GCSF) -Lab and follow-up with cycle 2 in 2 weeks -Chemo toxicity visit with me in 3 weeks -Continue symptom management  and supportive care at home -Pt requested notes/labs sent to [facility] fax [phone]   All questions were answered. The patient knows to call the clinic with any problems, questions or concerns. No barriers to learning was detected.     Alla Feeling, NP 09/06/20   Addendum  I have seen the patient, examined her. I agree with the assessment and and plan and have edited the notes.   Due to her probable allergy reaction, moderate side effects, I will change docetaxel to  Abraxane 175 mg/m, every 3 weeks, and continue Cytoxan.  I also discussed with the option of weekly Abraxane if she does not tolerate the above regimen.  She had severe bone pain after GCSF injection, and will hold it for next cycle.  We discussed the risk of infection and precautions.  She voiced good understanding and agrees with the plan.  We will see her back in 2 weeks for cycle 2 chemo.  Truitt Merle  09/06/2020

## 2020-09-05 NOTE — Telephone Encounter (Signed)
[name] left message with [facility] early this am. She c/o back pain 6/10 and constipation.  I returned her call.  She states she took 3 tablespoons of mag citrate and has moved her bowels.  She states the back pain is much less now.  She states she has a "cyst in her spine" and feels some of the pain cames from that.

## 2020-09-06 ENCOUNTER — Inpatient Hospital Stay: Payer: Medicare HMO

## 2020-09-06 ENCOUNTER — Inpatient Hospital Stay: Payer: Medicare HMO | Admitting: Nurse Practitioner

## 2020-09-06 ENCOUNTER — Encounter: Payer: Self-pay | Admitting: Hematology

## 2020-09-06 ENCOUNTER — Telehealth: Payer: Self-pay | Admitting: Nurse Practitioner

## 2020-09-06 ENCOUNTER — Encounter: Payer: Self-pay | Admitting: Nurse Practitioner

## 2020-09-06 VITALS — BP 138/65 | HR 109 | Temp 97.7°F | Resp 20 | Ht 66.0 in | Wt 207.6 lb

## 2020-09-06 DIAGNOSIS — M797 Fibromyalgia: Secondary | ICD-10-CM | POA: Diagnosis not present

## 2020-09-06 DIAGNOSIS — R69 Illness, unspecified: Secondary | ICD-10-CM | POA: Diagnosis not present

## 2020-09-06 DIAGNOSIS — E039 Hypothyroidism, unspecified: Secondary | ICD-10-CM | POA: Diagnosis not present

## 2020-09-06 DIAGNOSIS — C50411 Malignant neoplasm of upper-outer quadrant of right female breast: Secondary | ICD-10-CM | POA: Diagnosis not present

## 2020-09-06 DIAGNOSIS — M898X9 Other specified disorders of bone, unspecified site: Secondary | ICD-10-CM

## 2020-09-06 DIAGNOSIS — Z170 Estrogen receptor positive status [ER+]: Secondary | ICD-10-CM

## 2020-09-06 DIAGNOSIS — G4730 Sleep apnea, unspecified: Secondary | ICD-10-CM | POA: Diagnosis not present

## 2020-09-06 DIAGNOSIS — E669 Obesity, unspecified: Secondary | ICD-10-CM | POA: Diagnosis not present

## 2020-09-06 DIAGNOSIS — E785 Hyperlipidemia, unspecified: Secondary | ICD-10-CM | POA: Diagnosis not present

## 2020-09-06 DIAGNOSIS — D0512 Intraductal carcinoma in situ of left breast: Secondary | ICD-10-CM | POA: Diagnosis not present

## 2020-09-06 DIAGNOSIS — I10 Essential (primary) hypertension: Secondary | ICD-10-CM | POA: Diagnosis not present

## 2020-09-06 DIAGNOSIS — Z5189 Encounter for other specified aftercare: Secondary | ICD-10-CM | POA: Diagnosis not present

## 2020-09-06 DIAGNOSIS — Z5111 Encounter for antineoplastic chemotherapy: Secondary | ICD-10-CM | POA: Diagnosis not present

## 2020-09-06 NOTE — Telephone Encounter (Signed)
Scheduled follow-up appointment per 4/28 los. Patient is aware. 

## 2020-09-06 NOTE — Patient Instructions (Signed)
Nanoparticle Albumin-Bound Paclitaxel injection What is this medicine? NANOPARTICLE ALBUMIN-BOUND PACLITAXEL (Na no PAHR ti kuhl al BYOO muhn-bound PAK li TAX el) is a chemotherapy drug. It targets fast dividing cells, like cancer cells, and causes these cells to die. This medicine is used to treat advanced breast cancer, lung cancer, and pancreatic cancer. This medicine may be used for other purposes; ask your health care provider or pharmacist if you have questions. COMMON BRAND NAME(S): Abraxane What should I tell my health care provider before I take this medicine? They need to know if you have any of these conditions:  kidney disease  liver disease  low blood counts, like low white cell, platelet, or red cell counts  lung or breathing disease, like asthma  tingling of the fingers or toes, or other nerve disorder  an unusual or allergic reaction to paclitaxel, albumin, other chemotherapy, other medicines, foods, dyes, or preservatives  pregnant or trying to get pregnant  breast-feeding How should I use this medicine? This drug is given as an infusion into a vein. It is administered in a hospital or clinic by a specially trained health care professional. Talk to your pediatrician regarding the use of this medicine in children. Special care may be needed. Overdosage: If you think you have taken too much of this medicine contact a poison control center or emergency room at once. NOTE: This medicine is only for you. Do not share this medicine with others. What if I miss a dose? It is important not to miss your dose. Call your doctor or health care professional if you are unable to keep an appointment. What may interact with this medicine? This medicine may interact with the following medications:  antiviral medicines for hepatitis, HIV or AIDS  certain antibiotics like erythromycin and clarithromycin  certain medicines for fungal infections like ketoconazole and  itraconazole  certain medicines for seizures like carbamazepine, phenobarbital, phenytoin  gemfibrozil  nefazodone  rifampin  St. John's wort This list may not describe all possible interactions. Give your health care provider a list of all the medicines, herbs, non-prescription drugs, or dietary supplements you use. Also tell them if you smoke, drink alcohol, or use illegal drugs. Some items may interact with your medicine. What should I watch for while using this medicine? Your condition will be monitored carefully while you are receiving this medicine. You will need important blood work done while you are taking this medicine. This medicine can cause serious allergic reactions. If you experience allergic reactions like skin rash, itching or hives, swelling of the face, lips, or tongue, tell your doctor or health care professional right away. In some cases, you may be given additional medicines to help with side effects. Follow all directions for their use. This drug may make you feel generally unwell. This is not uncommon, as chemotherapy can affect healthy cells as well as cancer cells. Report any side effects. Continue your course of treatment even though you feel ill unless your doctor tells you to stop. Call your doctor or health care professional for advice if you get a fever, chills or sore throat, or other symptoms of a cold or flu. Do not treat yourself. This drug decreases your body's ability to fight infections. Try to avoid being around people who are sick. This medicine may increase your risk to bruise or bleed. Call your doctor or health care professional if you notice any unusual bleeding. Be careful brushing and flossing your teeth or using a toothpick because you may   get an infection or bleed more easily. If you have any dental work done, tell your dentist you are receiving this medicine. Avoid taking products that contain aspirin, acetaminophen, ibuprofen, naproxen, or  ketoprofen unless instructed by your doctor. These medicines may hide a fever. Do not become pregnant while taking this medicine or for 6 months after stopping it. Women should inform their doctor if they wish to become pregnant or think they might be pregnant. Men should not father a child while taking this medicine or for 3 months after stopping it. There is a potential for serious side effects to an unborn child. Talk to your health care professional or pharmacist for more information. Do not breast-feed an infant while taking this medicine or for 2 weeks after stopping it. This medicine may interfere with the ability to get pregnant or to father a child. You should talk to your doctor or health care professional if you are concerned about your fertility. What side effects may I notice from receiving this medicine? Side effects that you should report to your doctor or health care professional as soon as possible:  allergic reactions like skin rash, itching or hives, swelling of the face, lips, or tongue  breathing problems  changes in vision  fast, irregular heartbeat  low blood pressure  mouth sores  pain, tingling, numbness in the hands or feet  signs of decreased platelets or bleeding - bruising, pinpoint red spots on the skin, black, tarry stools, blood in the urine  signs of decreased red blood cells - unusually weak or tired, feeling faint or lightheaded, falls  signs of infection - fever or chills, cough, sore throat, pain or difficulty passing urine  signs and symptoms of liver injury like dark yellow or brown urine; general ill feeling or flu-like symptoms; light-colored stools; loss of appetite; nausea; right upper belly pain; unusually weak or tired; yellowing of the eyes or skin  swelling of the ankles, feet, hands  unusually slow heartbeat Side effects that usually do not require medical attention (report to your doctor or health care professional if they continue or  are bothersome):  diarrhea  hair loss  loss of appetite  nausea, vomiting  tiredness This list may not describe all possible side effects. Call your doctor for medical advice about side effects. You may report side effects to FDA at 1-800-FDA-1088. Where should I keep my medicine? This drug is given in a hospital or clinic and will not be stored at home. NOTE: This sheet is a summary. It may not cover all possible information. If you have questions about this medicine, talk to your doctor, pharmacist, or health care provider.  2021 [employer]vier/Gold Standard (2016-12-30 13:03:45)  

## 2020-09-07 ENCOUNTER — Telehealth: Payer: Self-pay

## 2020-09-07 NOTE — Telephone Encounter (Signed)
-----   Message from Truitt Merle, MD sent at 09/07/2020  2:41 PM EDT ----- Regarding: RE: finger tips Santiago Glad,  Could you call her back to see if she has neuropathy from chemo, take B complex if she is not on  Thanks   Krista Blue  ----- Message ----- From: Phoebe Perch Sent: 09/07/2020   2:25 PM EDT To: Rockwell Germany, RN, Truitt Merle, MD, # Subject: finger tips                                    Patient called and left messages, she just wants to know if its normal for your finger tips to go numb from getting chemo, not sure if someone has already called her back yet or not, but that's the question if someone could call her back and let her know

## 2020-09-07 NOTE — Telephone Encounter (Signed)
Pt states that her nails at the cuticle are "sore".  I gave her reassurance.

## 2020-09-08 ENCOUNTER — Emergency Department (HOSPITAL_COMMUNITY)
Admission: EM | Admit: 2020-09-08 | Discharge: 2020-09-08 | Disposition: A | Discharge status: Home / Self Care | Payer: Medicare HMO | Attending: Emergency Medicine | Admitting: Emergency Medicine

## 2020-09-08 DIAGNOSIS — Z85828 Personal history of other malignant neoplasm of skin: Secondary | ICD-10-CM | POA: Diagnosis not present

## 2020-09-08 DIAGNOSIS — E039 Hypothyroidism, unspecified: Secondary | ICD-10-CM | POA: Insufficient documentation

## 2020-09-08 DIAGNOSIS — I10 Essential (primary) hypertension: Secondary | ICD-10-CM | POA: Diagnosis not present

## 2020-09-08 DIAGNOSIS — Z853 Personal history of malignant neoplasm of breast: Secondary | ICD-10-CM | POA: Insufficient documentation

## 2020-09-08 DIAGNOSIS — Z79899 Other long term (current) drug therapy: Secondary | ICD-10-CM | POA: Diagnosis not present

## 2020-09-08 DIAGNOSIS — R21 Rash and other nonspecific skin eruption: Secondary | ICD-10-CM | POA: Diagnosis not present

## 2020-09-08 LAB — CBC WITH DIFFERENTIAL/PLATELET
Abs Immature Granulocytes: 7.89 10*3/uL — ABNORMAL HIGH (ref 0.00–0.07)
Basophils Absolute: 0.1 10*3/uL (ref 0.0–0.1)
Basophils Relative: 0 %
Eosinophils Absolute: 0 10*3/uL (ref 0.0–0.5)
Eosinophils Relative: 0 %
HCT: 37 % (ref 36.0–46.0)
Hemoglobin: 12.2 g/dL (ref 12.0–15.0)
Immature Granulocytes: 20 %
Lymphocytes Relative: 9 %
Lymphs Abs: 3.7 10*3/uL (ref 0.7–4.0)
MCH: 30.4 pg (ref 26.0–34.0)
MCHC: 33 g/dL (ref 30.0–36.0)
MCV: 92.3 fL (ref 80.0–100.0)
Monocytes Absolute: 3.5 10*3/uL — ABNORMAL HIGH (ref 0.1–1.0)
Monocytes Relative: 9 %
Neutro Abs: 24.2 10*3/uL — ABNORMAL HIGH (ref 1.7–7.7)
Neutrophils Relative %: 62 %
Platelets: 172 10*3/uL (ref 150–400)
RBC: 4.01 MIL/uL (ref 3.87–5.11)
RDW: 13.3 % (ref 11.5–15.5)
WBC: 39.4 10*3/uL — ABNORMAL HIGH (ref 4.0–10.5)
nRBC: 0.3 % — ABNORMAL HIGH (ref 0.0–0.2)

## 2020-09-08 LAB — URINALYSIS, ROUTINE W REFLEX MICROSCOPIC
Bilirubin Urine: NEGATIVE
Glucose, UA: NEGATIVE mg/dL
Ketones, ur: NEGATIVE mg/dL
Leukocytes,Ua: NEGATIVE
Nitrite: NEGATIVE
Protein, ur: NEGATIVE mg/dL
Specific Gravity, Urine: 1.01 (ref 1.005–1.030)
pH: 6 (ref 5.0–8.0)

## 2020-09-08 LAB — BASIC METABOLIC PANEL
Anion gap: 9 (ref 5–15)
BUN: 30 mg/dL — ABNORMAL HIGH (ref 8–23)
CO2: 26 mmol/L (ref 22–32)
Calcium: 9 mg/dL (ref 8.9–10.3)
Chloride: 104 mmol/L (ref 98–111)
Creatinine, Ser: 0.99 mg/dL (ref 0.44–1.00)
GFR, Estimated: >60 mL/min (ref >60)
Glucose, Bld: 103 mg/dL — ABNORMAL HIGH (ref 70–99)
Potassium: 3.9 mmol/L (ref 3.5–5.1)
Sodium: 139 mmol/L (ref 135–145)

## 2020-09-08 NOTE — ED Triage Notes (Signed)
Patient reports rash starting on left hand and spreading. Patient also has visible tremor in hands. Heart rate elevated in triage. Patient received chemo on 4/21. Patient denies pain

## 2020-09-08 NOTE — Discharge Instructions (Signed)
You came to the emergency room today to be evaluated for your hand rash.  Physical exam was reassuring.  Your lab work obtained was reassuring.  Please follow-up with your oncologist as needed.  Get help right away if you: Have a fever and your symptoms suddenly get worse. Develop confusion. Have a severe headache or a stiff neck. Have severe joint pains or stiffness. Have a seizure. Develop a rash that covers all or most of your body. The rash may or may not be painful. Develop blisters that: Are on top of the rash. Grow larger or grow together. Are painful. Are inside your nose or mouth. Develop a rash that: Looks like purple pinprick-sized spots all over your body. Has a "bull's eye" or looks like a target. Is not related to sun exposure, is red and painful, and causes your skin to peel.

## 2020-09-08 NOTE — ED Triage Notes (Signed)
Emergency Medicine Provider Triage Evaluation Note  [name] , a 65 y.o. female  was evaluated in triage.  Pt complains of bilateral hand rash that started this morning. Denies pruritis. No fever or chills. Patient is concerned she is having an allergic reaction to chemotherapy or bone marrow injection she had a few days ago. She states she feels like her tongue is slightly swollen. She took benadryl prior to arrival. Denies chest pain.  Review of Systems  Positive: Rash Negative: Fever  Physical Exam  BP (!) 167/81 (BP Location: Left Arm)   Pulse (!) 127   Temp 98.4 F (36.9 C) (Oral)   Resp 18   LMP  (LMP Unknown)   SpO2 94%  Gen:   Awake, no distress   HEENT:  Atraumatic  Resp:  Normal effort  Cardiac:  tachycardic Abd:   Nondistended, nontender  MSK:   Moves extremities without difficulty  Neuro:  Speech clear   Medical Decision Making  Medically screening exam initiated at 5:58 PM.  Appropriate orders placed.  [name] was informed that the remainder of the evaluation will be completed by another provider, this initial triage assessment does not replace that evaluation, and the importance of remaining in the ED until their evaluation is complete.  Clinical Impression  Bilateral hand rash. Patient tachycardic at triage. Labs ordered.    Suzy Bouchard, [place] 09/08/20 1801

## 2020-09-08 NOTE — ED Provider Notes (Signed)
[facility] DEPT Provider Note   CSN: [id number] Arrival date & time: 09/08/20  1703     History Chief Complaint  Patient presents with  . Rash  . Tremors    [name] is a 65 y.o. 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   She received 1 dose of Taxotere and Cyclophosphamide chemotherapy on 4/21.  Had allergic reaction including throat constriction, tongue swelling, and chest pressure.  Patient received Pepcid, Benadryl, and Ativan.  Patient completed treatment.  Patient had continued lip swelling 2 days postchemotherapy.  Patient was seen at was examined ED on 4/27 for concerns of tongue swelling.  Patient was not observed to have any allergic reaction at that time.  Saw her oncologist on 4/28 reported her "puffy," feeling and was evaluated by provider at that visit.  Patient presents today with chief complaint of bilateral hand rash.  Patient reports that this rash started this morning after waking.  Patient with reports taking Benadryl at 1415 100 without improvement in symptoms.  Patient denies any pruritus, pain, or discharge associated with rash.  Patient reports that she still has a "puffy and fullness," feeling in her mouth.  Patient has been able to eat and drink today without difficulty.  Patient denies any difficulty swallowing, no drooling, no difficulty breathing, patient denies any fevers, chills.  HPI     Past Medical History:  Diagnosis Date  . Anal fissure   . Atrial septal aneurysm / if pfo  echo 6 13  10/23/2011  . Breast cancer ([facility])   . Cataract    both eyes  . Depression   . Fatty liver    "pre fatty liver"  . Fibromyalgia   . Headache(784.0)    hx of migraines when younger  . Heart palpitations    hx with normal holter event monitoring  . Hx: UTI (urinary tract infection)   . Hyperlipidemia   . Hypertension   . Hypothyroidism   . Obesity   . Pneumonia 1972   hx of  .  Polyp of colon   . 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 Skin cancer    basal, squamous cell  . Sleep apnea    uses cpap    Patient Active Problem List   Diagnosis Date Noted  . Malignant neoplasm of upper-outer quadrant of right breast in female, estrogen receptor positive ([place]) 06/25/2020  . 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 Hepatic steatosis 11/27/2014  . Hx of adenomatous colonic polyps 11/27/2014  . Visit for preventive health examination 03/28/2014  . Hyperlipidemia 03/28/2014  . Left rotator cuff tear 07/07/2013  . Tear of left rotator cuff 07/07/2013  . Rhinitis 06/01/2013  . Chest wall deformity 04/12/2012  . Atrial septal aneurysm / if pfo  echo 6 13  10/23/2011  . OSA (obstructive sleep apnea) 11/27/2010  . Dyslipidemia 11/01/2010  . Flushing 08/29/2010  . Labile hypertension 08/29/2010  . MUSCLE CRAMPS, FOOT 06/10/2010  . OTHER SLEEP DISTURBANCES 06/10/2010  . ANXIETY, SITUATIONAL 05/08/2010  . VERTIGO, POSITIONAL 03/15/2010  . VITAMIN D DEFICIENCY 02/18/2010  . OBESITY 02/18/2010  . ALLERGIC RHINITIS 02/18/2010  . PLANTAR FASCIITIS 02/18/2010  . TWITCHING 06/11/2009  . NUMBNESS, HAND 06/11/2009  . CERVICAL STRAIN, ACUTE 06/11/2009  . OTHER MALAISE AND FATIGUE 07/09/2007  . HYPERTENSION 01/28/2007  . Hypothyroidism 11/11/2006  . COMMON MIGRAINE 11/11/2006  . GERD 11/11/2006  . FIBROMYALGIA 11/11/2006  Past Surgical History:  Procedure Laterality Date  . BREAST LUMPECTOMY WITH RADIOACTIVE SEED AND SENTINEL LYMPH NODE BIOPSY Right 07/26/2020   Procedure: RIGHT BREAST LUMPECTOMY WITH RADIOACTIVE SEED AND SENTINEL LYMPH NODE BIOPSY;  Surgeon: Jovita Kussmaul, MD;  Location: [facility];  Service: General;  Laterality: Right;  . BREAST LUMPECTOMY WITH RADIOACTIVE SEED LOCALIZATION Left 07/26/2020   Procedure: LEFT BREAST LUMPECTOMY X 2  WITH RADIOACTIVE SEED LOCALIZATION;  Surgeon: Jovita Kussmaul, MD;  Location: [facility];  Service: General;   Laterality: Left;  . COLONOSCOPY    . DILATION AND CURETTAGE OF UTERUS  1978  . [place]  . KNEE ARTHROSCOPY     both in past  . POLYPECTOMY    . SHOULDER OPEN ROTATOR CUFF REPAIR Left 07/07/2013   Procedure: LEFT SHOULDER MINI OPEN SUBACROMIAL DECOMPRESSION ROTATOR CUFF REPAIR AND POSSIBLE PATCH GRAFT ;  Surgeon: Johnn Hai, MD;  Location: [facility];  Service: Orthopedics;  Laterality: Left;  with interscaline block  . SKIN CANCER EXCISION Bilateral    arm, legs, and chest  . TONSILLECTOMY       OB History    Gravida  8   Para  6   Term  0   Preterm  0   AB  1   Living  6     SAB  0   IAB  0   Ectopic  1   Multiple  0   Live Births              Family History  Problem Relation Age of Onset  . Hypertension Mother        low borderline  . Osteoporosis Mother   . Hypertension Father   . Liver disease Father        amyloid deceased  . Hyperlipidemia Father   . Melanoma Sister   . Cancer Sister        melanoma   . Juvenile Diabetes Daughter   . ADD / ADHD Child   . Hyperlipidemia Other        Maternal grandmother  . Colon cancer Neg Hx   . Stomach cancer Neg Hx   . Colon polyps Neg Hx   . Esophageal cancer Neg Hx   . Rectal cancer Neg Hx     Social History   Tobacco Use  . Smoking status: Never Smoker  . Smokeless tobacco: Never Used  Vaping Use  . Vaping Use: Never used  Substance Use Topics  . Alcohol use: Yes    Comment: rare  . Drug use: No    Home Medications Prior to Admission medications   Medication Sig Start Date End Date Taking? Authorizing Provider  Ascorbic Acid (VITAMIN C) 1000 MG tablet Take 1,000 mg by mouth daily.    [provider]  b complex vitamins capsule Take 1 capsule by mouth daily.    [provider]  Bioflavonoid Products (QUERCETIN COMPLEX IMMUNE PO) Take 1 capsule by mouth daily.    [provider]  Cholecalciferol (VITAMIN D) 50 MCG (2000 UT) tablet Take  4,000 Units by mouth daily.    [provider]  dexamethasone (DECADRON) 4 MG tablet Take 2 tablets (8 mg total) by mouth 2 (two) times daily. Start the day before Taxotere. Then again the day after chemo for 3 days. 08/17/20   Truitt Merle, MD  hydrochlorothiazide (HYDRODIURIL) 25 MG tablet TAKE 1 TABLET BY MOUTH EVERY DAY Patient taking differently: Take 25  mg by mouth daily. 04/23/20   Panosh, Standley Brooking, MD  hydroxychloroquine (PLAQUENIL) 200 MG tablet Take 200 mg by mouth 2 (two) times a week.    [provider]  lidocaine-prilocaine (EMLA) cream Apply to affected area once 08/17/20   Truitt Merle, MD  LORazepam (ATIVAN) 1 MG tablet Take 0.5-1 tablets (0.5-1 mg total) by mouth every 8 (eight) hours as needed for anxiety. anxiety 05/19/18   Panosh, Standley Brooking, MD  MAGNESIUM CARBONATE PO Take 1 tablet by mouth daily.    [provider]  melatonin 5 MG TABS Take 5 mg by mouth at bedtime.    [provider]  Menaquinone-7 (VITAMIN K2) 100 MCG CAPS Take 100 mcg by mouth daily.    [provider]  metoprolol succinate (TOPROL XL) 25 MG 24 hr tablet TAKE ONCE A DAY AS DIRECTED Patient taking differently: Take by mouth daily as needed (Blood pressure). TAKE ONCE A DAY AS DIRECTED 02/10/19   Fay Records, MD  Multiple Vitamin (MULTIVITAMIN) tablet Take 1 tablet by mouth daily.    [provider]  NON FORMULARY Take 48.6 mg by mouth See admin instructions. Thyroid desiccated (procine) sr 2 capsules 5 days a week, 3 capsules 2 days per week (Tues and Fri)    [provider]  OLIVE LEAF EXTRACT PO Take 1 capsule by mouth daily.    [provider]  ondansetron (ZOFRAN) 8 MG tablet Take 1 tablet (8 mg total) by mouth 2 (two) times daily as needed for refractory nausea / vomiting. Start on day 3 after chemo. 08/17/20   Truitt Merle, MD  OVER THE COUNTER MEDICATION Take 2 capsules by mouth in the morning and at bedtime. Ifnla Med    [provider]   OVER THE COUNTER MEDICATION Take 1 capsule by mouth daily. Essential Pro    [provider]  OVER THE COUNTER MEDICATION Take 1 capsule by mouth daily. 5 HTP    [provider]  potassium chloride SA (KLOR-CON) 20 MEQ tablet TAKE ONE TABLET BY MOUTH ONE TIME DAILY 08/08/20   Panosh, Standley Brooking, MD  Probiotic Product (PROBIOTIC PO) Take 1 capsule by mouth daily.    [provider]  prochlorperazine (COMPAZINE) 10 MG tablet Take 1 tablet (10 mg total) by mouth every 6 (six) hours as needed (Nausea or vomiting). 08/17/20   Truitt Merle, MD  traMADol (ULTRAM) 50 MG tablet Take 1-2 tablets (50-100 mg total) by mouth every 6 (six) hours as needed. Patient not taking: Reported on 08/09/2020 07/26/20   Autumn Messing III, MD  VITAMIN E PO Take 23 mg by mouth daily.    [provider]  ZINC GLUCONATE PO Take 10 mg by mouth daily.    [provider]    Allergies    Penicillins, Cefdinir, Norvasc [amlodipine besylate], Cetirizine, Irbesartan, Molds & smuts, and Tylenol [acetaminophen]  Review of Systems   Review of Systems  Constitutional: Negative for chills and fever.  HENT: Negative for drooling, facial swelling, trouble swallowing and voice change.   Respiratory: Negative for shortness of breath.   Cardiovascular: Negative for chest pain.  Gastrointestinal: Negative for abdominal pain, diarrhea and rectal pain.  Skin: Positive for rash. Negative for color change, pallor and wound.  Neurological: Positive for tremors. Negative for weakness and numbness.    Physical Exam Updated Vital Signs BP 134/82   Pulse 95   Temp 98.4 F (36.9 C) (Oral)   Resp 18   LMP  (  LMP Unknown)   SpO2 96%   Physical Exam Vitals and nursing note reviewed.  Constitutional:      General: She is not in acute distress.    Appearance: She is not ill-appearing, toxic-appearing or diaphoretic.  HENT:     Head: Normocephalic and atraumatic. No raccoon eyes, abrasion, contusion,  masses, right periorbital erythema or left periorbital erythema.     Jaw: No trismus or pain on movement.     Mouth/Throat:     Lips: Pink. No lesions.     Mouth: Mucous membranes are moist.     Tongue: No lesions. Tongue does not deviate from midline.     Pharynx: Oropharynx is clear. Uvula midline. No pharyngeal swelling, oropharyngeal exudate, posterior oropharyngeal erythema or uvula swelling.     Comments: Swelling observed to patient's tongue  Patient able to handle oral secretions without difficulty Eyes:     General: No scleral icterus.       Right eye: No discharge.        Left eye: No discharge.  Cardiovascular:     Rate and Rhythm: Normal rate.  Pulmonary:     Effort: Pulmonary effort is normal. No tachypnea, bradypnea or respiratory distress.     Breath sounds: No stridor.     Comments: Patient able to speak in full complete sentences without difficulty.  Oxygen saturation 96% on room air. Musculoskeletal:     Comments: minimal erythema to knuckles of right hand erythema noted to dorsum of left hand  +3 radial pulse bilaterally Sensation tact to all digits on bilateral hands.  Cap refill less than 2 seconds on all digits of bilateral hands.  Full grip strength  Skin:    General: Skin is warm and dry.  Neurological:     General: No focal deficit present.     Mental Status: She is alert.  Psychiatric:        Behavior: Behavior is cooperative.     ED Results / Procedures / Treatments   Labs (all labs ordered are listed, but only abnormal results are displayed) Labs Reviewed  CBC WITH DIFFERENTIAL/PLATELET - Abnormal; Notable for the following components:      Result Value   WBC 39.4 (*)    nRBC 0.3 (*)    Neutro Abs 24.2 (*)    Monocytes Absolute 3.5 (*)    Abs Immature Granulocytes 7.89 (*)    All other components within normal limits  BASIC METABOLIC PANEL - Abnormal; Notable for the following components:   Glucose, Bld 103 (*)    BUN 30 (*)    All  other components within normal limits  URINALYSIS, ROUTINE W REFLEX MICROSCOPIC - Abnormal; Notable for the following components:   Color, Urine STRAW (*)    Hgb urine dipstick SMALL (*)    Bacteria, UA RARE (*)    All other components within normal limits  URINE CULTURE    EKG None  Radiology No results found.  Procedures Procedures   Medications Ordered in ED Medications - No data to display  ED Course  I have reviewed the triage vital signs and the nursing notes.  Pertinent labs & imaging results that were available during my care of the patient were reviewed by me and considered in my medical decision making (see chart for details).    MDM Rules/Calculators/A&P                          Alert 65 year old female  no acute distress, nontoxic-appearing.  Patient appears anxious.  Presents with chief complaint of rash to bilateral hands as well as feeling of "fullness," in her mouth.  Patient took Benadryl at 1415 100 today.    Patient received Taxotere and Cyclophosphamide chemotherapy on 4/21 followed by Pegfilgrastim on 4/23 for her breast cancer.  Per chart review patient had allergic reaction to initial chemotherapy on 4/21.  Patient able to speak in full complete sentences without difficulty.  Patient handling oral secretions without difficulty.  No swelling or edema noted to patient's face or neck.  Oropharynx clear without swelling or erythema.  Patient able to tolerate p.o. liquids without difficulty.  Low suspicion for angioedema or anaphylactic reaction at this time.  Minimal erythema to knuckles of right hand.  Erythema noted to dorsum of left hand.  Rash does not appear urticarial in nature.   Patient's initial blood pressure and heart rate elevated.  Values improved throughout hospital stay.  Elevation likely secondary to anxiety.  Patient lab work was obtained while patient was in triage. BMP shows BUN elevated at 30 and creatinine within normal is. CBC shows  leukocytosis at 39.4 with neutrophils elevated at 24.2  Due to patient's marked leukocytosis will contact on-call oncologist.  Spoke with on-call oncologist Dr. Marin Olp who reports that leukocytosis is secondary to patient receiving Pegfilgrastim.    Patient was discussed with and evaluated by Dr. Tyrone Nine.   Unsure if rash is possibly related to patient's recent chemotherapy treatment.  We will have patient follow-up with her oncologist.  Discussed results, findings, treatment and follow up. Patient advised of return precautions. Patient verbalized understanding and agreed with plan.    Final Clinical Impression(s) / ED Diagnoses Final diagnoses:  Rash    Rx / DC Orders ED Discharge Orders    None       Loni Beckwith, PA-C 09/09/20 [place], DO 09/09/20 1502

## 2020-09-10 ENCOUNTER — Encounter: Payer: Self-pay | Admitting: Hematology

## 2020-09-10 ENCOUNTER — Encounter: Payer: Self-pay | Admitting: Pharmacist

## 2020-09-10 LAB — URINE CULTURE

## 2020-09-10 NOTE — Addendum Note (Signed)
Addended by: Tora Kindred on: 09/10/2020 02:51 PM   Modules accepted: Orders

## 2020-09-13 ENCOUNTER — Ambulatory Visit (HOSPITAL_COMMUNITY): Payer: Medicare HMO

## 2020-09-13 ENCOUNTER — Other Ambulatory Visit (HOSPITAL_COMMUNITY): Payer: Medicare HMO

## 2020-09-14 ENCOUNTER — Other Ambulatory Visit: Payer: Self-pay

## 2020-09-14 ENCOUNTER — Ambulatory Visit (HOSPITAL_COMMUNITY): Payer: Medicare HMO

## 2020-09-14 ENCOUNTER — Other Ambulatory Visit (HOSPITAL_COMMUNITY): Payer: Medicare HMO

## 2020-09-14 ENCOUNTER — Other Ambulatory Visit: Payer: Self-pay | Admitting: Internal Medicine

## 2020-09-17 ENCOUNTER — Encounter: Payer: Self-pay | Admitting: Internal Medicine

## 2020-09-17 ENCOUNTER — Other Ambulatory Visit: Payer: Self-pay

## 2020-09-17 ENCOUNTER — Ambulatory Visit (INDEPENDENT_AMBULATORY_CARE_PROVIDER_SITE_OTHER): Payer: Medicare HMO | Admitting: Internal Medicine

## 2020-09-17 VITALS — BP 140/70 | HR 79 | Temp 98.4°F | Ht 66.0 in | Wt 206.2 lb

## 2020-09-17 DIAGNOSIS — F419 Anxiety disorder, unspecified: Secondary | ICD-10-CM

## 2020-09-17 DIAGNOSIS — R030 Elevated blood-pressure reading, without diagnosis of hypertension: Secondary | ICD-10-CM

## 2020-09-17 DIAGNOSIS — R69 Illness, unspecified: Secondary | ICD-10-CM | POA: Diagnosis not present

## 2020-09-17 DIAGNOSIS — Z79899 Other long term (current) drug therapy: Secondary | ICD-10-CM

## 2020-09-17 DIAGNOSIS — R21 Rash and other nonspecific skin eruption: Secondary | ICD-10-CM | POA: Diagnosis not present

## 2020-09-17 DIAGNOSIS — Z170 Estrogen receptor positive status [ER+]: Secondary | ICD-10-CM

## 2020-09-17 DIAGNOSIS — I471 Supraventricular tachycardia: Secondary | ICD-10-CM

## 2020-09-17 DIAGNOSIS — C50411 Malignant neoplasm of upper-outer quadrant of right female breast: Secondary | ICD-10-CM

## 2020-09-17 NOTE — Progress Notes (Signed)
I saw the pt in 2020   Please set up appt later this summer     Pt should keep tabs of BP periodically since

## 2020-09-17 NOTE — Progress Notes (Signed)
Chief Complaint  Patient presents with  . Hypertension  . Rash    HPI: [name] 65 y.o. come in for follow-up blood pressure readings and rash states that she developed soon after her chemotherapy doxetaxel April 21  pegfilgrastim injection on 4/23.,r left hand and arm.  Sought care in the emergency room felt had a reaction t He is under active treatment for breast cancer with chemotherapy.  She had pain and then the rash.  The rash is less painful but persists on the left hand wrist area she denies having an IV specifically there.  developed pain and then rash seen in emergency room forApril  27 for pain abdominal and then 09/08/2020 for rash her symptoms were crescendo.  Like.  Is still concerns about her blood pressure it seems to rise when she is either in pain or distress or anxious and will come down to the 121/1 30/70 range with relaxation or other maneuvers.  She is on a as needed add metoprolol if needed per Dr. Harrington Challenger and is done this a few times. Other medicines on the list are supplements or related to her chemotherapy. Her hair is thinned out but not totally lost has changed color. ROS: See pertinent positives and negatives per HPI.  No history of psoriasis She has had some right buttocks pain since moving a lot of furniture around but no radiation or change she sees a Restaurant manager, fast food. She does have a dermatologist y Nurse, adult Dr. Girtha Rm although is hard to get in for an appointment usually.  Past Medical History:  Diagnosis Date  . Anal fissure   . Atrial septal aneurysm / if pfo  echo 6 13  10/23/2011  . Breast cancer ([place])   . Cataract    both eyes  . Depression   . Fatty liver    "pre fatty liver"  . Fibromyalgia   . Headache(784.0)    hx of migraines when younger  . Heart palpitations    hx with normal holter event monitoring  . Hx: UTI (urinary tract infection)   . Hyperlipidemia   . Hypertension   . Hypothyroidism   . Obesity   . Pneumonia 1972   hx of   . Polyp of colon   . 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 Skin cancer    basal, squamous cell  . Sleep apnea    uses cpap    Family History  Problem Relation Age of Onset  . Hypertension Mother        low borderline  . Osteoporosis Mother   . Hypertension Father   . Liver disease Father        amyloid deceased  . Hyperlipidemia Father   . Melanoma Sister   . Cancer Sister        melanoma   . Juvenile Diabetes Daughter   . ADD / ADHD Child   . Hyperlipidemia Other        Maternal grandmother  . Colon cancer Neg Hx   . Stomach cancer Neg Hx   . Colon polyps Neg Hx   . Esophageal cancer Neg Hx   . Rectal cancer Neg Hx     Social History   Socioeconomic History  . Marital status: Married    Spouse name: Not on file  . Number of children: 6  . Years of education: Not on file  . Highest education level: Not on file  Occupational History  . Occupation: TEACHER ASST    Employer:  [employer]  Tobacco Use  . Smoking status: Never Smoker  . Smokeless tobacco: Never Used  Vaping Use  . Vaping Use: Never used  Substance and Sexual Activity  . Alcohol use: Yes    Comment: rare  . Drug use: No  . Sexual activity: Not on file  Other Topics Concern  . Not on file  Social History Narrative   Teachers Aide [employer]  Not workking since last February . 15 left shoulder surgery    Married   Special educ   HH of 4   3 outside dogs, 8 goats, 40 chickens and quail. [place]   Job stresses   Ex husband passes away   Not working after injur at school  Shoulder neck                Social Determinants of Health   Financial Resource Strain: [place]   . Difficulty of Paying Living Expenses: Not hard at all  Food Insecurity: No Food Insecurity  . Worried About Charity fundraiser in the Last Year: Never true  . Ran Out of Food in the Last Year: Never true  Transportation Needs: No Transportation Needs  . Lack of Transportation (Medical):  No  . Lack of Transportation (Non-Medical): No  Physical Activity: Inactive  . Days of Exercise per Week: 0 days  . Minutes of Exercise per Session: 0 min  Stress: No Stress Concern Present  . Feeling of Stress : Not at all  Social Connections: Moderately Integrated  . Frequency of Communication with Friends and Family: More than three times a week  . Frequency of Social Gatherings with Friends and Family: More than three times a week  . Attends Religious Services: More than 4 times per year  . Active Member of Clubs or Organizations: No  . Attends Archivist Meetings: Never  . Marital Status: Married    Outpatient Medications Prior to Visit  Medication Sig Dispense Refill  . Ascorbic Acid (VITAMIN C) 1000 MG tablet Take 1,000 mg by mouth daily.    Marland Kitchen b complex vitamins capsule Take 1 capsule by mouth daily.    . Bioflavonoid Products (QUERCETIN COMPLEX IMMUNE PO) Take 1 capsule by mouth daily.    . Cholecalciferol (VITAMIN D) 50 MCG (2000 UT) tablet Take 4,000 Units by mouth daily.    Marland Kitchen dexamethasone (DECADRON) 4 MG tablet Take 2 tablets (8 mg total) by mouth 2 (two) times daily. Start the day before Taxotere. Then again the day after chemo for 3 days. 30 tablet 1  . hydrochlorothiazide (HYDRODIURIL) 25 MG tablet TAKE 1 TABLET BY MOUTH EVERY DAY 90 tablet 0  . hydroxychloroquine (PLAQUENIL) 200 MG tablet Take 200 mg by mouth 2 (two) times a week.    . lidocaine-prilocaine (EMLA) cream Apply to affected area once 30 g 3  . LORazepam (ATIVAN) 1 MG tablet Take 0.5-1 tablets (0.5-1 mg total) by mouth every 8 (eight) hours as needed for anxiety. anxiety 20 tablet 0  . MAGNESIUM CARBONATE PO Take 1 tablet by mouth daily.    . melatonin 5 MG TABS Take 5 mg by mouth at bedtime.    . Menaquinone-7 (VITAMIN K2) 100 MCG CAPS Take 100 mcg by mouth daily.    . metoprolol succinate (TOPROL XL) 25 MG 24 hr tablet TAKE ONCE A DAY AS DIRECTED (Patient taking differently: Take by mouth daily as  needed (Blood pressure). TAKE ONCE A DAY AS  DIRECTED) 30 tablet 1  . Multiple Vitamin (MULTIVITAMIN) tablet Take 1 tablet by mouth daily.    . NON FORMULARY Take 48.6 mg by mouth See admin instructions. Thyroid desiccated (procine) sr 2 capsules 5 days a week, 3 capsules 2 days per week (Tues and Fri)    . OLIVE LEAF EXTRACT PO Take 1 capsule by mouth daily.    . ondansetron (ZOFRAN) 8 MG tablet Take 1 tablet (8 mg total) by mouth 2 (two) times daily as needed for refractory nausea / vomiting. Start on day 3 after chemo. 30 tablet 1  . OVER THE COUNTER MEDICATION Take 2 capsules by mouth in the morning and at bedtime. Ifnla Med    . OVER THE COUNTER MEDICATION Take 1 capsule by mouth daily. Essential Pro    . potassium chloride SA (KLOR-CON) 20 MEQ tablet TAKE ONE TABLET BY MOUTH ONE TIME DAILY 90 tablet 0  . Probiotic Product (PROBIOTIC PO) Take 1 capsule by mouth daily.    . prochlorperazine (COMPAZINE) 10 MG tablet Take 1 tablet (10 mg total) by mouth every 6 (six) hours as needed (Nausea or vomiting). 30 tablet 1  . traMADol (ULTRAM) 50 MG tablet Take 1-2 tablets (50-100 mg total) by mouth every 6 (six) hours as needed. 20 tablet 1  . UBIQUINOL PO Take by mouth.    Marland Kitchen VITAMIN E PO Take 23 mg by mouth daily.    Marland Kitchen ZINC GLUCONATE PO Take 10 mg by mouth daily.    Marland Kitchen OVER THE COUNTER MEDICATION Take 1 capsule by mouth daily. 5 HTP     No facility-administered medications prior to visit.     EXAM:  BP 140/70 (BP Location: Left Arm, Patient Position: Sitting, Cuff Size: Large)   Pulse 79   Temp 98.4 F (36.9 C) (Oral)   Ht 5\' 6"  (1.676 m)   Wt 206 lb 3.2 oz (93.5 kg)   LMP  (LMP Unknown)   SpO2 98%   BMI 33.28 kg/m   Body mass index is 33.28 kg/m. Wt Readings from Last 3 Encounters:  09/17/20 206 lb 3.2 oz (93.5 kg)  09/06/20 207 lb 9.6 oz (94.2 kg)  08/30/20 208 lb 6.4 oz (94.5 kg)    GENERAL: vitals reviewed and listed above, alert, oriented, appears well hydrated and in no  acute distress hair thinning  Has on hat  HEENT: atraumatic, conjunctiva  clear, no obvious abnormalities on inspection of external nose and ears OP : masked   NECK: no obvious masses on inspection palpation  LUNGS: clear to auscultation bilaterally, no wheezes, rales or rhonchi, good air movement CV: HRRR, no clubbing cyanosis or  peripheral edema nl cap refill  MS: moves all extremities without noticeable focal  abnormality PSYCH: pleasant and cooperative, no obvious depression or anxiety  Blood pressure log review Skin left dorsal wrist with linear pink-red palpable scaling rash lesion.  No vesicle no oozing.       BP Readings from Last 3 Encounters:  09/17/20 140/70  09/08/20 124/72  09/06/20 138/65    ASSESSMENT AND PLAN:  Discussed the following assessment and plan:  Rash  Elevated blood pressure reading  Medication management  Malignant neoplasm of upper-outer quadrant of right breast in female, estrogen receptor positive (HCC)  Paroxysmal supraventricular tachycardia (HCC)  Anxiety Ask Dr Lillia Carmel al to evaluate    Will send note copy.  Rash appears to have occurred after infusion reaction but not immediate.  See ED visit. She has had labile  hypertension for a while but has had a controlled she reports that relaxing on her left side makes her blood pressure go down a number of other things.  Uncertain if metoprolol as as needed the final answer if elevated all the time may do better on a half a dose once a day. At this point I think she should stop checking her blood pressure readings for the next 2 to 3 weeks and see how things go.  She is getting enough blood pressure readings through her oncology and treatment team.  Even though so may be increased at a anxiety producing visit they can be monitored. -Patient advised to return or notify health care team  if  new concerns arise.  Patient Instructions  Take  BP readings less try 2-3 weeks off of self readings.      Your baseline  BP seems normal. Or lcose to normal   Will follow the rash   we can have you see  Dermatology . Ask oncology.   Will forward the   Note to dr Delman Cheadle and get FU.   Take time for yourself  .      Standley Brooking. Alfhild Partch M.D.

## 2020-09-17 NOTE — Patient Instructions (Addendum)
Take  BP readings less try 2-3 weeks off of self readings.     Your baseline  BP seems normal. Or lcose to normal   Will follow the rash   we can have you see  Dermatology . Ask oncology.   Will forward the   Note to dr Delman Cheadle and get FU.   Take time for yourself  .

## 2020-09-17 NOTE — Progress Notes (Signed)
[place]   [phone] [phone]   Clinic Follow up Note   Patient Care Team: Panosh, Standley Brooking, MD as PCP - General Fay Records, MD as PCP - Cardiology (Cardiology) Arvella Nigh, MD as Consulting Physician (Obstetrics and Gynecology) Dr Peggye Form, MD as Consulting Physician (Orthopedic Surgery) Ladene Artist, MD as Consulting Physician (Gastroenterology) Melina Schools, MD as Consulting Physician (Orthopedic Surgery) Rockwell Germany, RN as Oncology Nurse Navigator Mauro Kaufmann, RN as Oncology Nurse Navigator Jovita Kussmaul, MD as Consulting Physician (General Surgery) Truitt Merle, MD as Consulting Physician (Hematology) Eppie Gibson, MD as Attending Physician (Radiation Oncology) Jari Pigg, MD as Consulting Physician (Dermatology)  Date of Service:  09/21/2020  CHIEF COMPLAINT: f/u of Left breast DCIS and Right breast cancer  SUMMARY OF ONCOLOGIC HISTORY: Oncology History Overview Note  Cancer Staging Ductal carcinoma in situ (DCIS) of left breast Staging form: Breast, AJCC 8th Edition - Clinical stage from 06/21/2020: Stage 0 (cTis (DCIS), cN0, cM0, G2, ER+, PR+, HER2: Not Assessed) - Signed by Truitt Merle, MD on 06/26/2020 Stage prefix: Initial diagnosis  Malignant neoplasm of upper-outer quadrant of right breast in female, estrogen receptor positive ([place]) Staging form: Breast, AJCC 8th Edition - Clinical stage from 06/21/2020: Stage IA (cT1b, cN0, cM0, G2, ER+, PR+, HER2-) - Signed by Truitt Merle, MD on 06/26/2020 Stage prefix: Initial diagnosis - Pathologic stage from 07/26/2020: Stage IA (pT1c, pN1a, cM0, G2, ER+, PR+, HER2-) - Signed by Truitt Merle, MD on 08/09/2020 Stage prefix: Initial diagnosis Nuclear grade: G2 Histologic grading system: 3 grade system Residual tumor (R): R0 - None    Malignant neoplasm of upper-outer quadrant of right breast in female, estrogen receptor positive ([place])  06/08/2020 Mammogram    IMPRESSION: 1. 9 x 7 x 6 mm mass in the 12 o'clock position of the right breast, 2cmfn with imaging features highly suspicious for malignancy. 2. 4 mm group of indeterminate calcifications in the 12 o'clock position of the left breast and 4 mm group of indeterminate calcifications in the 1 o'clock position of the left breast. Together, the groups span an area measuring 3.9 cm.   06/21/2020 Cancer Staging   Staging form: Breast, AJCC 8th Edition - Clinical stage from 06/21/2020: Stage IA (cT1b, cN0, cM0, G2, ER+, PR+, HER2-) - Signed by Truitt Merle, MD on 06/26/2020 Stage prefix: Initial diagnosis   06/21/2020 Initial Biopsy   Diagnosis 1. Breast, right, needle core biopsy, 12 oc - INVASIVE MAMMARY CARCINOMA - MAMMARY CARCINOMA IN SITU - SEE COMMENT 2. Breast, left, needle core biopsy, 12 oc - MAMMARY CARCINOMA IN-SITU WITH NECROSIS AND CALCIFICATIONS - SEE COMMENT 3. Breast, left, needle core biopsy, 1 oc - MAMMARY CARCINOMA IN-SITU WITH NECROSIS AND CALCIFICATIONS - SEE COMMENT Microscopic Comment 1. The biopsy material shows an infiltrative proliferation of cells with large vesicular nuclei with inconspicuous nucleoli, arranged linearly and in small clusters. Based on the biopsy, the carcinoma appears Nottingham grade 2 of 3 and measures 0.8 cm in greatest linear extent. E-cadherin and prognostic markers (ER/PR/ki-67/HER2)are pending and will be reported in an addendum. Dr. Jeannie Done reviewed the case and agrees with the above diagnosis. These results were called to [facility] on June 22, 2020. 2. and 3. E-cadherin is pending and will be reported in an addendum.     1. E-cadherin is POSITIVE supporting a ductal origin. 2. E-cadherin is POSITIVE supporting a ductal origin. A prognostic panel (ER/PR) is pending and  will be reported in an addendum 3. E-cadherin is positive supporting a ductal origin. The focus is less pronounced on the deeper sections and in  isolation would likely be considered atypical ductal hyperplasia.   06/21/2020 Receptors her2   1. PROGNOSTIC INDICATORS Results: IMMUNOHISTOCHEMICAL AND MORPHOMETRIC ANALYSIS PERFORMED MANUALLY The tumor cells are EQUIVOCAL for Her2 (2+). HER2 by FISH will be performed and the results reported separately Estrogen Receptor: 95%, POSITIVE, STRONG STAINING INTENSITY Progesterone Receptor: 40%, POSITIVE, MODERATE STAINING INTENSITY Proliferation Marker Ki67: 10%  1. FLUORESCENCE IN-SITU HYBRIDIZATION Results: GROUP 5: HER2 **NEGATIVE** Equivocal form of amplification of the HER2 gene was detected in the IHC 2+ tissue sample received from this individual. HER2 FISH was performed by a technologist and cell imaging and analysis on the [employer].   06/25/2020 Initial Diagnosis   Malignant neoplasm of upper-outer quadrant of right breast in female, estrogen receptor positive ([place])   07/03/2020 Breast MRI   IMPRESSION: 1. Known RIGHT breast cancer, 12 o'clock axis, at anterior depth, measuring 1 cm greatest extent, manifesting as a spiculated enhancing mass on MRI, with associated biopsy clip. Expected post biopsy changes are seen within the adjacent outer RIGHT breast. 2. No evidence of additional multifocal or multicentric disease within the RIGHT breast. 3. Known LEFT breast DCIS within the slightly outer LEFT breast, at anterior depth, corresponding to the biopsy site labeled 1 o'clock axis, with associated enhancement only at the margins of the biopsy cavity measuring up to 5 mm greatest dimension. 4. Known LEFT breast DCIS within the upper central LEFT breast, at middle depth, corresponding to the biopsy site labeled 12 o'clock axis. Contiguous linear non-mass enhancement extends 2.3 cm superior-medial to the biopsy cavity, most likely post biopsy change but possibly contiguous extent of disease. 5. No evidence of additional multifocal or multicentric disease within the LEFT  breast. 6. No evidence of metastatic lymphadenopathy.     07/26/2020 Cancer Staging   Staging form: Breast, AJCC 8th Edition - Pathologic stage from 07/26/2020: Stage IA (pT1c, pN1a, cM0, G2, ER+, PR+, HER2-) - Signed by Truitt Merle, MD on 08/09/2020 Stage prefix: Initial diagnosis Nuclear grade: G2 Histologic grading system: 3 grade system Residual tumor (R): R0 - None   07/26/2020 Surgery   RIGHT BREAST LUMPECTOMY WITH RADIOACTIVE SEED AND SENTINEL LYMPH NODE BIOPSY by Dr Marlou Starks   07/26/2020 Pathology Results   FINAL MICROSCOPIC DIAGNOSIS:   A. LYMPH NODE, RIGHT AXILLARY #1, SENTINEL, EXCISION:  - Lymph node, negative for carcinoma (0/1)   B. LYMPH NODE, RIGHT AXILLARY, SENTINEL, EXCISION:  - Benign fibroadipose tissue, negative for carcinoma   C. BREAST, RIGHT, LUMPECTOMY:  - Invasive ductal carcinoma, 1.5 cm, grade 2  - Ductal carcinoma in situ, low grade  - Resection margins are negative for carcinoma; closest is the anterior  margin of 0.2 cm  - Biopsy site changes  - See oncology table   D. BREAST, LEFT, LUMPECTOMY:  - Benign breast parenchyma with prominent biopsy-related changes  - Negative for residual ductal carcinoma in situ  - See oncology table   E. LYMPH NODE, RIGHT AXILLARY #2, SENTINEL, EXCISION:  - Invasive ductal carcinoma, see comment     COMMENT:   E.  Lymph node tissue is not identified.  Findings likely represent an  entirely replaced lymph node with foci of extranodal extension.    07/26/2020 Miscellaneous   Mammaprint High Risk of Luminla Type B 29% risk of recurrence in 10 years if untreated.  Her Mammaprint index is -0.175 She has  94.6% benefit of chemotherapy and hormaonal therapy.      08/20/2020 Imaging   CT C/A/P IMPRESSION: 1. No definitive imaging findings to suggest metastatic disease in the chest, abdomen or pelvis. 2. Postoperative changes of bilateral lumpectomy and right axillary lymph node dissection with what appears to be a  large postoperative seroma in the right axilla, as detailed above. Attention on follow-up studies is recommended to ensure the stability or regression of this collection. 3. Additional incidental findings, as above.   08/28/2020 Imaging   Bone Scan IMPRESSION: No definite scintigraphic evidence of osseous metastases.   08/30/2020 -  Chemotherapy   Adjuvant Docetaxel and Cytoxan q3weeks for 4 cycles starting 08/30/20.        Ductal carcinoma in situ (DCIS) of left breast  06/08/2020 Mammogram   IMPRESSION: 1. 9 x 7 x 6 mm mass in the 12 o'clock position of the right breast, 2cmfn with imaging features highly suspicious for malignancy. 2. 4 mm group of indeterminate calcifications in the 12 o'clock position of the left breast and 4 mm group of indeterminate calcifications in the 1 o'clock position of the left breast. Together, the groups span an area measuring 3.9 cm.   06/21/2020 Cancer Staging   Staging form: Breast, AJCC 8th Edition - Clinical stage from 06/21/2020: Stage 0 (cTis (DCIS), cN0, cM0, G2, ER+, PR+, HER2: Not Assessed) - Signed by Truitt Merle, MD on 06/26/2020 Stage prefix: Initial diagnosis   06/21/2020 Initial Biopsy   Diagnosis 1. Breast, right, needle core biopsy, 12 oc - INVASIVE MAMMARY CARCINOMA - MAMMARY CARCINOMA IN SITU - SEE COMMENT 2. Breast, left, needle core biopsy, 12 oc - MAMMARY CARCINOMA IN-SITU WITH NECROSIS AND CALCIFICATIONS - SEE COMMENT 3. Breast, left, needle core biopsy, 1 oc - MAMMARY CARCINOMA IN-SITU WITH NECROSIS AND CALCIFICATIONS - SEE COMMENT Microscopic Comment 1. The biopsy material shows an infiltrative proliferation of cells with large vesicular nuclei with inconspicuous nucleoli, arranged linearly and in small clusters. Based on the biopsy, the carcinoma appears Nottingham grade 2 of 3 and measures 0.8 cm in greatest linear extent. E-cadherin and prognostic markers (ER/PR/ki-67/HER2)are pending and will be reported in an addendum.  Dr. Jeannie Done reviewed the case and agrees with the above diagnosis. These results were called to [facility] on June 22, 2020. 2. and 3. E-cadherin is pending and will be reported in an addendum.     1. E-cadherin is POSITIVE supporting a ductal origin. 2. E-cadherin is POSITIVE supporting a ductal origin. A prognostic panel (ER/PR) is pending and will be reported in an addendum 3. E-cadherin is positive supporting a ductal origin. The focus is less pronounced on the deeper sections and in isolation would likely be considered atypical ductal hyperplasia.   06/25/2020 Initial Diagnosis   Ductal carcinoma in situ (DCIS) of left breast    Receptors her2   2. PROGNOSTIC INDICATORS Results: IMMUNOHISTOCHEMICAL AND MORPHOMETRIC ANALYSIS PERFORMED MANUALLY Estrogen Receptor: 95%, POSITIVE, STRONG STAINING INTENSITY Progesterone Receptor: 30%, POSITIVE, STRONG STAINING INTENSITY   07/03/2020 Breast MRI   IMPRESSION: 1. Known RIGHT breast cancer, 12 o'clock axis, at anterior depth, measuring 1 cm greatest extent, manifesting as a spiculated enhancing mass on MRI, with associated biopsy clip. Expected post biopsy changes are seen within the adjacent outer RIGHT breast. 2. No evidence of additional multifocal or multicentric disease within the RIGHT breast. 3. Known LEFT breast DCIS within the slightly outer LEFT breast, at anterior depth, corresponding to the biopsy site labeled 1 o'clock axis,  with associated enhancement only at the margins of the biopsy cavity measuring up to 5 mm greatest dimension. 4. Known LEFT breast DCIS within the upper central LEFT breast, at middle depth, corresponding to the biopsy site labeled 12 o'clock axis. Contiguous linear non-mass enhancement extends 2.3 cm superior-medial to the biopsy cavity, most likely post biopsy change but possibly contiguous extent of disease. 5. No evidence of additional multifocal or multicentric  disease within the LEFT breast. 6. No evidence of metastatic lymphadenopathy.     07/26/2020 Surgery   LEFT BREAST LUMPECTOMY X 2  WITH RADIOACTIVE SEED LOCALIZATION by Dr Marlou Starks   07/26/2020 Pathology Results   FINAL MICROSCOPIC DIAGNOSIS:   A. LYMPH NODE, RIGHT AXILLARY #1, SENTINEL, EXCISION:  - Lymph node, negative for carcinoma (0/1)   B. LYMPH NODE, RIGHT AXILLARY, SENTINEL, EXCISION:  - Benign fibroadipose tissue, negative for carcinoma   C. BREAST, RIGHT, LUMPECTOMY:  - Invasive ductal carcinoma, 1.5 cm, grade 2  - Ductal carcinoma in situ, low grade  - Resection margins are negative for carcinoma; closest is the anterior  margin of 0.2 cm  - Biopsy site changes  - See oncology table   D. BREAST, LEFT, LUMPECTOMY:  - Benign breast parenchyma with prominent biopsy-related changes  - Negative for residual ductal carcinoma in situ  - See oncology table   E. LYMPH NODE, RIGHT AXILLARY #2, SENTINEL, EXCISION:  - Invasive ductal carcinoma, see comment     COMMENT:   E.  Lymph node tissue is not identified.  Findings likely represent an  entirely replaced lymph node with foci of extranodal extension.       CURRENT THERAPY:  Adjuvant Docetaxel and Cytoxan q3weeks for 4 cycles starting 08/30/20. Given skin rash, changes taxol to Abraxane starting with C2 (09/21/20).   INTERVAL HISTORY:  [name] is here for a follow up of breast cancer. She was last seen by me 08/30/20. She presents to the clinic with her family member. She notes has skin rash has improved. She went to ED twice for severe bone pain from GCSF and then for skin rash.  She notes her bone pain lasted for 4-5 days. She was given Morphine for this in ED. Her family notes she is more sensitive to medications.  She notes 2 toes which hurts, but no significant pain.  She notes she is taking supplements. I reviewed her medication list with her. She notes she sent list through mychart of supplements.     REVIEW OF SYSTEMS:   Constitutional: Denies fevers, chills or abnormal weight loss Eyes: Denies blurriness of vision Ears, nose, mouth, throat, and face: Denies mucositis or sore throat Respiratory: Denies cough, dyspnea or wheezes Cardiovascular: Denies palpitation, chest discomfort or lower extremity swelling Gastrointestinal:  Denies nausea, heartburn or change in bowel habits Skin: Denies abnormal skin rashes Lymphatics: Denies new lymphadenopathy or easy bruising Neurological:Denies numbness, tingling or new weaknesses Behavioral/Psych: Mood is stable, no new changes  All other systems were reviewed with the patient and are negative.  MEDICAL HISTORY:  Past Medical History:  Diagnosis Date  . Anal fissure   . Atrial septal aneurysm / if pfo  echo 6 13  10/23/2011  . Breast cancer ([place])   . Cataract    both eyes  . Depression   . Fatty liver    "pre fatty liver"  . Fibromyalgia   . Headache(784.0)    hx of migraines when younger  . Heart palpitations    hx with normal  holter event monitoring  . Hx: UTI (urinary tract infection)   . Hyperlipidemia   . Hypertension   . Hypothyroidism   . Obesity   . Pneumonia 1972   hx of  . Polyp of colon   . 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 Skin cancer    basal, squamous cell  . Sleep apnea    uses cpap    SURGICAL HISTORY: Past Surgical History:  Procedure Laterality Date  . BREAST LUMPECTOMY WITH RADIOACTIVE SEED AND SENTINEL LYMPH NODE BIOPSY Right 07/26/2020   Procedure: RIGHT BREAST LUMPECTOMY WITH RADIOACTIVE SEED AND SENTINEL LYMPH NODE BIOPSY;  Surgeon: Jovita Kussmaul, MD;  Location: [facility];  Service: General;  Laterality: Right;  . BREAST LUMPECTOMY WITH RADIOACTIVE SEED LOCALIZATION Left 07/26/2020   Procedure: LEFT BREAST LUMPECTOMY X 2  WITH RADIOACTIVE SEED LOCALIZATION;  Surgeon: Jovita Kussmaul, MD;  Location: [facility];  Service: General;  Laterality: Left;  . COLONOSCOPY    . DILATION AND CURETTAGE OF UTERUS   1978  . [place]  . KNEE ARTHROSCOPY     both in past  . POLYPECTOMY    . SHOULDER OPEN ROTATOR CUFF REPAIR Left 07/07/2013   Procedure: LEFT SHOULDER MINI OPEN SUBACROMIAL DECOMPRESSION ROTATOR CUFF REPAIR AND POSSIBLE PATCH GRAFT ;  Surgeon: Johnn Hai, MD;  Location: [facility];  Service: Orthopedics;  Laterality: Left;  with interscaline block  . SKIN CANCER EXCISION Bilateral    arm, legs, and chest  . TONSILLECTOMY      I have reviewed the social history and family history with the patient and they are unchanged from previous note.  ALLERGIES:  is allergic to penicillins, taxotere [docetaxel], cefdinir, norvasc [amlodipine besylate], cetirizine, irbesartan, molds & smuts, pegfilgrastim, and tylenol [acetaminophen].  MEDICATIONS:  Current Outpatient Medications  Medication Sig Dispense Refill  . Ascorbic Acid (VITAMIN C) 1000 MG tablet Take 1,000 mg by mouth daily.    Marland Kitchen b complex vitamins capsule Take 1 capsule by mouth daily.    . Bioflavonoid Products (QUERCETIN COMPLEX IMMUNE PO) Take 1 capsule by mouth daily.    . Cholecalciferol (VITAMIN D) 50 MCG (2000 UT) tablet Take 4,000 Units by mouth daily.    Marland Kitchen dexamethasone (DECADRON) 4 MG tablet Take 2 tablets (8 mg total) by mouth 2 (two) times daily. Start the day before Taxotere. Then again the day after chemo for 3 days. 30 tablet 1  . hydrochlorothiazide (HYDRODIURIL) 25 MG tablet TAKE 1 TABLET BY MOUTH EVERY DAY 90 tablet 0  . hydroxychloroquine (PLAQUENIL) 200 MG tablet Take 200 mg by mouth 2 (two) times a week.    . lidocaine-prilocaine (EMLA) cream Apply to affected area once 30 g 3  . LORazepam (ATIVAN) 1 MG tablet Take 0.5-1 tablets (0.5-1 mg total) by mouth every 8 (eight) hours as needed for anxiety. anxiety 20 tablet 0  . MAGNESIUM CARBONATE PO Take 1 tablet by mouth daily.    . melatonin 5 MG TABS Take 5 mg by mouth at bedtime.    . Menaquinone-7 (VITAMIN K2) 100 MCG CAPS Take 100 mcg by mouth  daily.    . metoprolol succinate (TOPROL XL) 25 MG 24 hr tablet TAKE ONCE A DAY AS DIRECTED (Patient taking differently: Take by mouth daily as needed (Blood pressure). TAKE ONCE A DAY AS DIRECTED) 30 tablet 1  . Multiple Vitamin (MULTIVITAMIN) tablet Take 1 tablet by mouth daily.    . NON FORMULARY Take 48.6 mg by  mouth See admin instructions. Thyroid desiccated (procine) sr 2 capsules 5 days a week, 3 capsules 2 days per week (Tues and Fri)    . OLIVE LEAF EXTRACT PO Take 1 capsule by mouth daily.    . ondansetron (ZOFRAN) 8 MG tablet Take 1 tablet (8 mg total) by mouth 2 (two) times daily as needed for refractory nausea / vomiting. Start on day 3 after chemo. 30 tablet 1  . OVER THE COUNTER MEDICATION Take 2 capsules by mouth in the morning and at bedtime. Ifnla Med    . OVER THE COUNTER MEDICATION Take 1 capsule by mouth daily. Essential Pro    . potassium chloride SA (KLOR-CON) 20 MEQ tablet TAKE ONE TABLET BY MOUTH ONE TIME DAILY 90 tablet 0  . Probiotic Product (PROBIOTIC PO) Take 1 capsule by mouth daily.    . prochlorperazine (COMPAZINE) 10 MG tablet Take 1 tablet (10 mg total) by mouth every 6 (six) hours as needed (Nausea or vomiting). 30 tablet 1  . traMADol (ULTRAM) 50 MG tablet Take 1-2 tablets (50-100 mg total) by mouth every 6 (six) hours as needed. 20 tablet 1  . UBIQUINOL PO Take by mouth.    Marland Kitchen VITAMIN E PO Take 23 mg by mouth daily.    Marland Kitchen ZINC GLUCONATE PO Take 10 mg by mouth daily.     No current facility-administered medications for this visit.    PHYSICAL EXAMINATION: ECOG PERFORMANCE STATUS: 1 - Symptomatic but completely ambulatory  Vitals:   09/21/20 1139  BP: (!) 150/75  Pulse: 82  Resp: 16  Temp: 97.6 F (36.4 C)  SpO2: 100%   Filed Weights   09/21/20 1139  Weight: 207 lb (93.9 kg)    GENERAL:alert, no distress and comfortable SKIN: skin color, texture, turgor are normal, no rashes or significant lesions EYES: normal, Conjunctiva are pink and  non-injected, sclera clear  NECK: supple, thyroid normal size, non-tender, without nodularity LYMPH:  no palpable lymphadenopathy in the cervical, axillary  LUNGS: clear to auscultation and percussion with normal breathing effort HEART: regular rate & rhythm and no murmurs and no lower extremity edema ABDOMEN:abdomen soft, non-tender and normal bowel sounds Musculoskeletal:no cyanosis of digits and no clubbing  NEURO: alert & oriented x 3 with fluent speech, no focal motor/sensory deficits  LABORATORY DATA:  I have reviewed the data as listed CBC Latest Ref Rng & Units 09/21/2020 09/08/2020 09/05/2020  WBC 4.0 - 10.5 K/uL 7.5 39.4(H) -  Hemoglobin 12.0 - 15.0 g/dL 12.2 12.2 12.9  Hematocrit 36.0 - 46.0 % 37.1 37.0 38.0  Platelets 150 - 400 K/uL 285 172 -     CMP Latest Ref Rng & Units 09/21/2020 09/08/2020 09/05/2020  Glucose 70 - 99 mg/dL 91 103(H) 90  BUN 8 - 23 mg/dL 21 30(H) 16  Creatinine 0.44 - 1.00 mg/dL 0.74 0.99 0.70  Sodium 135 - 145 mmol/L 138 139 138  Potassium 3.5 - 5.1 mmol/L 4.1 3.9 4.4  Chloride 98 - 111 mmol/L 103 104 103  CO2 22 - 32 mmol/L 28 26 -  Calcium 8.9 - 10.3 mg/dL 9.3 9.0 -  Total Protein 6.5 - 8.1 g/dL 6.8 - -  Total Bilirubin 0.3 - 1.2 mg/dL 0.7 - -  Alkaline Phos 38 - 126 U/L 130(H) - -  AST 15 - 41 U/L 32 - -  ALT 0 - 44 U/L 56(H) - -      RADIOGRAPHIC STUDIES: I have personally reviewed the radiological images as listed and agreed  with the findings in the report. No results found.   ASSESSMENT & PLAN:  [name] is a 65 y.o. female with    1.Malignant neoplasm of upper-outer quadrant of right breast, StageIB,p(T1cN1aM0), ER+/PR+/HER2-, GradeIIANDLeftbreast DCIS, gradeII, ER+/PR+(removed by biopsy) -She was diagnosed in 06/2020.She was found to have b/l masses on mammogram. On biopsy she was seen to have2 small area ofDCIS inleft breast and 0.8cm right breast invasive ductal carcinoma. -She underwent B/l lumpectomy and  right SNLB by Dr Marlou Starks on 07/26/20. Her surgical path showed her left breast DCIS was completely removed with biopsy and no residual DCIS on path. Her grade II, 1.5cm invasive ductal carcinoma of right breast was completely removed with clear margins and 1/2 positive LNs. I discussed path with patient in great detail today.  -Given positive LN, Mammaprint was done and results show high risk of Luminal Type B with 29% risk ofdistantrecurrence.She has 94.6%chance of 5-year recurrence free withchemotherapy andhormonaltherapy.  -Staging CT C/A/P on 08/20/20 and bone scan on 08/28/20 were negative for metastatic disease. -Given her high risk of cancer I recommend adjuvant chemotherapy to reduce her risk. Given herhighconcerns for side effects on her heart and liver, I started her on moderately intensive Docetaxel and Cytoxan q3weeks for 4 cycles on 08/30/20.  -She did not tolerate GCSF injection with C1. She had severe bone pain and was evaluated in ED and requires morphine injection for pain control. Will hold on pegfilgrastim and only give filgrastim as needed. She did have skin rash after C1, likely from Docetaxel which can lead to rash and reaction. Will change it to Abraxane from cycle 2.  I reviewed potential side effects of Abraxane with her again, especially risk of neuropathy.  Recurrent cryotherapy to her hands and feet during infusion, she agrees. -Labs reviewed, CBC and CMP WNL except ALT 56, Alk Phos 130. Overall adequate to proceed with C2 Abraxane and Cytoxan today. I encouraged her to watch for neuropathy and use ice bags during infusions.  -I discussed if this regimen is not tolerable, will change to less intensive weekly chemo for the rest of treatment.  -F/u in 3 weeks.    2. Comorbidities:Depression, Fibromyalgia, HLD, HTN, Hypothyroidism, H/o Skin cancer(squamous and basal cell), sleep apnea  -She notes having Adrenal fatigue. I discussed based on her labs, this is not consistent  with adrenal insufficiency. I recommend she see endocrinologist -She is followed for HTN. She takes her BP at home. The bottom number is always low, and the top number goes to normal when she lays down. -On medications, continue to f/u with her other physicians.   3. Genetic testing, she will think about it.  4.  Fatty liver and transaminitis -She has chronic intermittent transaminitis, which she contributes to fatty liver.  She is on low sugar diet and drinks a lot of water -Liver morphology was unremarkable on recent CT scan -Labs reviewed-- AST 78, ALT 101 on 08/30/20.  -She does not drink alcohol, she knows to avoid Tylenol. -Her LFTs from 09/21/20 show ALT 60 and Alk Phos 130. Otherwise normal.   5. Constipation  -She notes she has been using fiber rich supplements and Magnesium. I discussed OTC options if needed.    PLAN: -Labs reviewed and adequate to proceed with C2 chemo with Abraxane and Cytoxan today, no pegfilgrastim  -Lab and f/u next week with NP Lacie  -Lab, f/u and Abraxane and Cytoxan in 3 weeks. -Check her supplement list with Pharmacy   No problem-specific Assessment &  Plan notes found for this encounter.   No orders of the defined types were placed in this encounter.  All questions were answered. The patient knows to call the clinic with any problems, questions or concerns. No barriers to learning was detected. The total time spent in the appointment was 30 minutes.     Truitt Merle, MD 09/21/2020   I, Joslyn Devon, am acting as scribe for Truitt Merle, MD.   I have reviewed the above documentation for accuracy and completeness, and I agree with the above.

## 2020-09-19 DIAGNOSIS — L309 Dermatitis, unspecified: Secondary | ICD-10-CM | POA: Diagnosis not present

## 2020-09-19 DIAGNOSIS — L986 Other infiltrative disorders of the skin and subcutaneous tissue: Secondary | ICD-10-CM | POA: Diagnosis not present

## 2020-09-21 ENCOUNTER — Other Ambulatory Visit: Payer: Self-pay

## 2020-09-21 ENCOUNTER — Inpatient Hospital Stay: Payer: Medicare HMO | Attending: Hematology | Admitting: Hematology

## 2020-09-21 ENCOUNTER — Inpatient Hospital Stay: Payer: Medicare HMO

## 2020-09-21 ENCOUNTER — Encounter: Payer: Self-pay | Admitting: *Deleted

## 2020-09-21 VITALS — BP 150/75 | HR 82 | Temp 97.6°F | Resp 16 | Ht 66.0 in | Wt 207.0 lb

## 2020-09-21 DIAGNOSIS — D0512 Intraductal carcinoma in situ of left breast: Secondary | ICD-10-CM | POA: Insufficient documentation

## 2020-09-21 DIAGNOSIS — Z85828 Personal history of other malignant neoplasm of skin: Secondary | ICD-10-CM | POA: Insufficient documentation

## 2020-09-21 DIAGNOSIS — K5900 Constipation, unspecified: Secondary | ICD-10-CM | POA: Insufficient documentation

## 2020-09-21 DIAGNOSIS — K635 Polyp of colon: Secondary | ICD-10-CM | POA: Diagnosis not present

## 2020-09-21 DIAGNOSIS — Z79899 Other long term (current) drug therapy: Secondary | ICD-10-CM | POA: Diagnosis not present

## 2020-09-21 DIAGNOSIS — M797 Fibromyalgia: Secondary | ICD-10-CM | POA: Diagnosis not present

## 2020-09-21 DIAGNOSIS — Z170 Estrogen receptor positive status [ER+]: Secondary | ICD-10-CM

## 2020-09-21 DIAGNOSIS — C50411 Malignant neoplasm of upper-outer quadrant of right female breast: Secondary | ICD-10-CM | POA: Diagnosis not present

## 2020-09-21 DIAGNOSIS — E785 Hyperlipidemia, unspecified: Secondary | ICD-10-CM | POA: Diagnosis not present

## 2020-09-21 DIAGNOSIS — Z5111 Encounter for antineoplastic chemotherapy: Secondary | ICD-10-CM | POA: Diagnosis not present

## 2020-09-21 DIAGNOSIS — G4730 Sleep apnea, unspecified: Secondary | ICD-10-CM | POA: Insufficient documentation

## 2020-09-21 DIAGNOSIS — G629 Polyneuropathy, unspecified: Secondary | ICD-10-CM | POA: Insufficient documentation

## 2020-09-21 DIAGNOSIS — K760 Fatty (change of) liver, not elsewhere classified: Secondary | ICD-10-CM | POA: Diagnosis not present

## 2020-09-21 DIAGNOSIS — I10 Essential (primary) hypertension: Secondary | ICD-10-CM | POA: Diagnosis not present

## 2020-09-21 DIAGNOSIS — E039 Hypothyroidism, unspecified: Secondary | ICD-10-CM | POA: Insufficient documentation

## 2020-09-21 LAB — CMP (CANCER CENTER ONLY)
ALT: 56 U/L — ABNORMAL HIGH (ref 0–44)
AST: 32 U/L (ref 15–41)
Albumin: 4 g/dL (ref 3.5–5.0)
Alkaline Phosphatase: 130 U/L — ABNORMAL HIGH (ref 38–126)
Anion gap: 7 (ref 5–15)
BUN: 21 mg/dL (ref 8–23)
CO2: 28 mmol/L (ref 22–32)
Calcium: 9.3 mg/dL (ref 8.9–10.3)
Chloride: 103 mmol/L (ref 98–111)
Creatinine: 0.74 mg/dL (ref 0.44–1.00)
GFR, Estimated: >60 mL/min (ref >60)
Glucose, Bld: 91 mg/dL (ref 70–99)
Potassium: 4.1 mmol/L (ref 3.5–5.1)
Sodium: 138 mmol/L (ref 135–145)
Total Bilirubin: 0.7 mg/dL (ref 0.3–1.2)
Total Protein: 6.8 g/dL (ref 6.5–8.1)

## 2020-09-21 LAB — CBC WITH DIFFERENTIAL (CANCER CENTER ONLY)
Abs Immature Granulocytes: 0.03 10*3/uL (ref 0.00–0.07)
Basophils Absolute: 0.1 10*3/uL (ref 0.0–0.1)
Basophils Relative: 1 %
Eosinophils Absolute: 0 10*3/uL (ref 0.0–0.5)
Eosinophils Relative: 0 %
HCT: 37.1 % (ref 36.0–46.0)
Hemoglobin: 12.2 g/dL (ref 12.0–15.0)
Immature Granulocytes: 0 %
Lymphocytes Relative: 23 %
Lymphs Abs: 1.7 10*3/uL (ref 0.7–4.0)
MCH: 30.3 pg (ref 26.0–34.0)
MCHC: 32.9 g/dL (ref 30.0–36.0)
MCV: 92.1 fL (ref 80.0–100.0)
Monocytes Absolute: 0.6 10*3/uL (ref 0.1–1.0)
Monocytes Relative: 8 %
Neutro Abs: 5 10*3/uL (ref 1.7–7.7)
Neutrophils Relative %: 68 %
Platelet Count: 285 10*3/uL (ref 150–400)
RBC: 4.03 MIL/uL (ref 3.87–5.11)
RDW: 14 % (ref 11.5–15.5)
WBC Count: 7.5 10*3/uL (ref 4.0–10.5)
nRBC: 0 % (ref 0.0–0.2)

## 2020-09-21 MED ORDER — SODIUM CHLORIDE 0.9 % IV SOLN
10.0000 mg | Freq: Once | INTRAVENOUS | Status: AC
  Administered 2020-09-21: 10 mg via INTRAVENOUS
  Filled 2020-09-21: qty 10

## 2020-09-21 MED ORDER — SODIUM CHLORIDE 0.9 % IV SOLN
500.0000 mg/m2 | Freq: Once | INTRAVENOUS | Status: AC
  Administered 2020-09-21: 1040 mg via INTRAVENOUS
  Filled 2020-09-21: qty 52

## 2020-09-21 MED ORDER — PALONOSETRON HCL INJECTION 0.25 MG/5ML
INTRAVENOUS | Status: AC
  Filled 2020-09-21: qty 5

## 2020-09-21 MED ORDER — SODIUM CHLORIDE 0.9 % IV SOLN
Freq: Once | INTRAVENOUS | Status: AC
  Filled 2020-09-21: qty 250

## 2020-09-21 MED ORDER — PACLITAXEL PROTEIN-BOUND CHEMO INJECTION 100 MG
175.0000 mg/m2 | Freq: Once | INTRAVENOUS | Status: AC
  Administered 2020-09-21: 375 mg via INTRAVENOUS
  Filled 2020-09-21: qty 75

## 2020-09-21 MED ORDER — PALONOSETRON HCL INJECTION 0.25 MG/5ML
0.2500 mg | Freq: Once | INTRAVENOUS | Status: AC
  Administered 2020-09-21: 0.25 mg via INTRAVENOUS

## 2020-09-21 NOTE — Patient Instructions (Signed)
[facility]  Discharge Instructions: Thank you for choosing [facility] to provide your oncology and hematology care.   If you have a lab appointment with the [facility], please go directly to the [facility] and check in at the registration area.   Wear comfortable clothing and clothing appropriate for easy access to any Portacath or PICC line.   We strive to give you quality time with your provider. You may need to reschedule your appointment if you arrive late (15 or more minutes).  Arriving late affects you and other patients whose appointments are after yours.  Also, if you miss three or more appointments without notifying the office, you may be dismissed from the clinic at the provider's discretion.      For prescription refill requests, have your pharmacy contact our office and allow 72 hours for refills to be completed.    Today you received the following chemotherapy and/or immunotherapy agents: paclitaxel-protein bound and cyclophosphamide.      To help prevent nausea and vomiting after your treatment, we encourage you to take your nausea medication as directed.  BELOW ARE SYMPTOMS THAT SHOULD BE REPORTED IMMEDIATELY: . *FEVER GREATER THAN 100.4 F (38 C) OR HIGHER . *CHILLS OR SWEATING . *NAUSEA AND VOMITING THAT IS NOT CONTROLLED WITH YOUR NAUSEA MEDICATION . *UNUSUAL SHORTNESS OF BREATH . *UNUSUAL BRUISING OR BLEEDING . *URINARY PROBLEMS (pain or burning when urinating, or frequent urination) . *BOWEL PROBLEMS (unusual diarrhea, constipation, pain near the anus) . TENDERNESS IN MOUTH AND THROAT WITH OR WITHOUT PRESENCE OF ULCERS (sore throat, sores in mouth, or a toothache) . UNUSUAL RASH, SWELLING OR PAIN  . UNUSUAL VAGINAL DISCHARGE OR ITCHING   Items with * indicate a potential emergency and should be followed up as soon as possible or go to the Emergency Department if any problems should occur.  Please show the  CHEMOTHERAPY ALERT CARD or IMMUNOTHERAPY ALERT CARD at check-in to the Emergency Department and triage nurse.  Should you have questions after your visit or need to cancel or reschedule your appointment, please contact [facility]  Dept: [phone]  and follow the prompts.  Office hours are 8:00 a.m. to 4:30 p.m. Monday - Friday. Please note that voicemails left after 4:00 p.m. may not be returned until the following business day.  We are closed weekends and major holidays. You have access to a nurse at all times for urgent questions. Please call the main number to the clinic Dept: [phone] and follow the prompts.   For any non-urgent questions, you may also contact your provider using MyChart. We now offer e-Visits for anyone 34 and older to request care online for non-urgent symptoms. For details visit [email or web address].   Also download the MyChart app! Go to the app store, search "MyChart", open the app, select [facility], and log in with your MyChart username and password.  Due to Covid, a mask is required upon entering the hospital/clinic. If you do not have a mask, one will be given to you upon arrival. For doctor visits, patients may have 1 support person aged 77 or older with them. For treatment visits, patients cannot have anyone with them due to current Covid guidelines and our immunocompromised population.   Nanoparticle Albumin-Bound Paclitaxel injection What is this medicine? NANOPARTICLE ALBUMIN-BOUND PACLITAXEL (Na no PAHR ti kuhl al BYOO muhn-bound PAK li TAX el) is a chemotherapy drug. It targets fast dividing cells, like cancer cells, and  causes these cells to die. This medicine is used to treat advanced breast cancer, lung cancer, and pancreatic cancer. This medicine may be used for other purposes; ask your health care provider or pharmacist if you have questions. COMMON BRAND NAME(S): Abraxane What should I tell my health care provider  before I take this medicine? They need to know if you have any of these conditions:  kidney disease  liver disease  low blood counts, like low white cell, platelet, or red cell counts  lung or breathing disease, like asthma  tingling of the fingers or toes, or other nerve disorder  an unusual or allergic reaction to paclitaxel, albumin, other chemotherapy, other medicines, foods, dyes, or preservatives  pregnant or trying to get pregnant  breast-feeding How should I use this medicine? This drug is given as an infusion into a vein. It is administered in a hospital or clinic by a specially trained health care professional. Talk to your pediatrician regarding the use of this medicine in children. Special care may be needed. Overdosage: If you think you have taken too much of this medicine contact a poison control center or emergency room at once. NOTE: This medicine is only for you. Do not share this medicine with others. What if I miss a dose? It is important not to miss your dose. Call your doctor or health care professional if you are unable to keep an appointment. What may interact with this medicine? This medicine may interact with the following medications:  antiviral medicines for hepatitis, HIV or AIDS  certain antibiotics like erythromycin and clarithromycin  certain medicines for fungal infections like ketoconazole and itraconazole  certain medicines for seizures like carbamazepine, phenobarbital, phenytoin  gemfibrozil  nefazodone  rifampin  St. John's wort This list may not describe all possible interactions. Give your health care provider a list of all the medicines, herbs, non-prescription drugs, or dietary supplements you use. Also tell them if you smoke, drink alcohol, or use illegal drugs. Some items may interact with your medicine. What should I watch for while using this medicine? Your condition will be monitored carefully while you are receiving this  medicine. You will need important blood work done while you are taking this medicine. This medicine can cause serious allergic reactions. If you experience allergic reactions like skin rash, itching or hives, swelling of the face, lips, or tongue, tell your doctor or health care professional right away. In some cases, you may be given additional medicines to help with side effects. Follow all directions for their use. This drug may make you feel generally unwell. This is not uncommon, as chemotherapy can affect healthy cells as well as cancer cells. Report any side effects. Continue your course of treatment even though you feel ill unless your doctor tells you to stop. Call your doctor or health care professional for advice if you get a fever, chills or sore throat, or other symptoms of a cold or flu. Do not treat yourself. This drug decreases your body's ability to fight infections. Try to avoid being around people who are sick. This medicine may increase your risk to bruise or bleed. Call your doctor or health care professional if you notice any unusual bleeding. Be careful brushing and flossing your teeth or using a toothpick because you may get an infection or bleed more easily. If you have any dental work done, tell your dentist you are receiving this medicine. Avoid taking products that contain aspirin, acetaminophen, ibuprofen, naproxen, or ketoprofen unless instructed  by your doctor. These medicines may hide a fever. Do not become pregnant while taking this medicine or for 6 months after stopping it. Women should inform their doctor if they wish to become pregnant or think they might be pregnant. Men should not father a child while taking this medicine or for 3 months after stopping it. There is a potential for serious side effects to an unborn child. Talk to your health care professional or pharmacist for more information. Do not breast-feed an infant while taking this medicine or for 2 weeks after  stopping it. This medicine may interfere with the ability to get pregnant or to father a child. You should talk to your doctor or health care professional if you are concerned about your fertility. What side effects may I notice from receiving this medicine? Side effects that you should report to your doctor or health care professional as soon as possible:  allergic reactions like skin rash, itching or hives, swelling of the face, lips, or tongue  breathing problems  changes in vision  fast, irregular heartbeat  low blood pressure  mouth sores  pain, tingling, numbness in the hands or feet  signs of decreased platelets or bleeding - bruising, pinpoint red spots on the skin, black, tarry stools, blood in the urine  signs of decreased red blood cells - unusually weak or tired, feeling faint or lightheaded, falls  signs of infection - fever or chills, cough, sore throat, pain or difficulty passing urine  signs and symptoms of liver injury like dark yellow or brown urine; general ill feeling or flu-like symptoms; light-colored stools; loss of appetite; nausea; right upper belly pain; unusually weak or tired; yellowing of the eyes or skin  swelling of the ankles, feet, hands  unusually slow heartbeat Side effects that usually do not require medical attention (report to your doctor or health care professional if they continue or are bothersome):  diarrhea  hair loss  loss of appetite  nausea, vomiting  tiredness This list may not describe all possible side effects. Call your doctor for medical advice about side effects. You may report side effects to FDA at 1-800-FDA-1088. Where should I keep my medicine? This drug is given in a hospital or clinic and will not be stored at home. NOTE: This sheet is a summary. It may not cover all possible information. If you have questions about this medicine, talk to your doctor, pharmacist, or health care provider.  2021 [employer]  Standard (2016-12-30 13:03:45)

## 2020-09-22 ENCOUNTER — Encounter: Payer: Self-pay | Admitting: Hematology

## 2020-09-24 ENCOUNTER — Ambulatory Visit: Payer: Medicare HMO

## 2020-09-24 ENCOUNTER — Telehealth: Payer: Self-pay | Admitting: *Deleted

## 2020-09-24 NOTE — Telephone Encounter (Signed)
-----   Message from Sinda Du, RN sent at 09/21/2020 12:36 PM EDT ----- Regarding: Dr. Burr Medico - new medication (Abraxane) First abraxane

## 2020-09-24 NOTE — Progress Notes (Signed)
[name]   Telephone:([phone] Fax:([phone]) [phone]   Clinic Follow up Note   Patient Care Team: Panosh, Standley Brooking, MD as PCP - General Fay Records, MD as PCP - Cardiology (Cardiology) Arvella Nigh, MD as Consulting Physician (Obstetrics and Gynecology) Dr Peggye Form, MD as Consulting Physician (Orthopedic Surgery) Ladene Artist, MD as Consulting Physician (Gastroenterology) Melina Schools, MD as Consulting Physician (Orthopedic Surgery) Rockwell Germany, RN as Oncology Nurse Navigator Mauro Kaufmann, RN as Oncology Nurse Navigator Jovita Kussmaul, MD as Consulting Physician (General Surgery) Truitt Merle, MD as Consulting Physician (Hematology) Eppie Gibson, MD as Attending Physician (Radiation Oncology) Jari Pigg, MD as Consulting Physician (Dermatology) 09/25/2020  CHIEF COMPLAINT: 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   SUMMARY OF ONCOLOGIC HISTORY: Oncology History Overview Note  Cancer Staging Ductal carcinoma in situ (DCIS) of left breast Staging form: Breast, AJCC 8th Edition - Clinical stage from 06/21/2020: Stage 0 (cTis (DCIS), cN0, cM0, G2, ER+, PR+, HER2: Not Assessed) - Signed by Truitt Merle, MD on 06/26/2020 Stage prefix: Initial diagnosis  Malignant neoplasm of upper-outer quadrant of right breast in female, estrogen receptor positive ([place]) Staging form: Breast, AJCC 8th Edition - Clinical stage from 06/21/2020: Stage IA (cT1b, cN0, cM0, G2, ER+, PR+, HER2-) - Signed by Truitt Merle, MD on 06/26/2020 Stage prefix: Initial diagnosis - Pathologic stage from 07/26/2020: Stage IA (pT1c, pN1a, cM0, G2, ER+, PR+, HER2-) - Signed by Truitt Merle, MD on 08/09/2020 Stage prefix: Initial diagnosis Nuclear grade: G2 Histologic grading system: 3 grade system Residual tumor (R): R0 - None    Malignant neoplasm of upper-outer quadrant of right breast in female, estrogen receptor positive ([place])  06/08/2020 Mammogram   IMPRESSION: 1. 9 x 7 x 6  mm mass in the 12 o'clock position of the right breast, 2cmfn with imaging features highly suspicious for malignancy. 2. 4 mm group of indeterminate calcifications in the 12 o'clock position of the left breast and 4 mm group of indeterminate calcifications in the 1 o'clock position of the left breast. Together, the groups span an area measuring 3.9 cm.   06/21/2020 Cancer Staging   Staging form: Breast, AJCC 8th Edition - Clinical stage from 06/21/2020: Stage IA (cT1b, cN0, cM0, G2, ER+, PR+, HER2-) - Signed by Truitt Merle, MD on 06/26/2020 Stage prefix: Initial diagnosis   06/21/2020 Initial Biopsy   Diagnosis 1. Breast, right, needle core biopsy, 12 oc - INVASIVE MAMMARY CARCINOMA - MAMMARY CARCINOMA IN SITU - SEE COMMENT 2. Breast, left, needle core biopsy, 12 oc - MAMMARY CARCINOMA IN-SITU WITH NECROSIS AND CALCIFICATIONS - SEE COMMENT 3. Breast, left, needle core biopsy, 1 oc - MAMMARY CARCINOMA IN-SITU WITH NECROSIS AND CALCIFICATIONS - SEE COMMENT Microscopic Comment 1. The biopsy material shows an infiltrative proliferation of cells with large vesicular nuclei with inconspicuous nucleoli, arranged linearly and in small clusters. Based on the biopsy, the carcinoma appears Nottingham grade 2 of 3 and measures 0.8 cm in greatest linear extent. E-cadherin and prognostic markers (ER/PR/ki-67/HER2)are pending and will be reported in an addendum. Dr. Jeannie Done reviewed the case and agrees with the above diagnosis. These results were called to [facility] on June 22, 2020. 2. and 3. E-cadherin is pending and will be reported in an addendum.     1. E-cadherin is POSITIVE supporting a ductal origin. 2. E-cadherin is POSITIVE supporting a ductal origin. A prognostic panel (ER/PR) is pending and will be reported in  an addendum 3. E-cadherin is positive supporting a ductal origin. The focus is less pronounced on the deeper sections and in isolation would likely be  considered atypical ductal hyperplasia.   06/21/2020 Receptors her2   1. PROGNOSTIC INDICATORS Results: IMMUNOHISTOCHEMICAL AND MORPHOMETRIC ANALYSIS PERFORMED MANUALLY The tumor cells are EQUIVOCAL for Her2 (2+). HER2 by FISH will be performed and the results reported separately Estrogen Receptor: 95%, POSITIVE, STRONG STAINING INTENSITY Progesterone Receptor: 40%, POSITIVE, MODERATE STAINING INTENSITY Proliferation Marker Ki67: 10%  1. FLUORESCENCE IN-SITU HYBRIDIZATION Results: GROUP 5: HER2 **NEGATIVE** Equivocal form of amplification of the HER2 gene was detected in the IHC 2+ tissue sample received from this individual. HER2 FISH was performed by a technologist and cell imaging and analysis on the [employer].   06/25/2020 Initial Diagnosis   Malignant neoplasm of upper-outer quadrant of right breast in female, estrogen receptor positive ([place])   07/03/2020 Breast MRI   IMPRESSION: 1. Known RIGHT breast cancer, 12 o'clock axis, at anterior depth, measuring 1 cm greatest extent, manifesting as a spiculated enhancing mass on MRI, with associated biopsy clip. Expected post biopsy changes are seen within the adjacent outer RIGHT breast. 2. No evidence of additional multifocal or multicentric disease within the RIGHT breast. 3. Known LEFT breast DCIS within the slightly outer LEFT breast, at anterior depth, corresponding to the biopsy site labeled 1 o'clock axis, with associated enhancement only at the margins of the biopsy cavity measuring up to 5 mm greatest dimension. 4. Known LEFT breast DCIS within the upper central LEFT breast, at middle depth, corresponding to the biopsy site labeled 12 o'clock axis. Contiguous linear non-mass enhancement extends 2.3 cm superior-medial to the biopsy cavity, most likely post biopsy change but possibly contiguous extent of disease. 5. No evidence of additional multifocal or multicentric disease within the LEFT breast. 6. No evidence of  metastatic lymphadenopathy.     07/26/2020 Cancer Staging   Staging form: Breast, AJCC 8th Edition - Pathologic stage from 07/26/2020: Stage IA (pT1c, pN1a, cM0, G2, ER+, PR+, HER2-) - Signed by Truitt Merle, MD on 08/09/2020 Stage prefix: Initial diagnosis Nuclear grade: G2 Histologic grading system: 3 grade system Residual tumor (R): R0 - None   07/26/2020 Surgery   RIGHT BREAST LUMPECTOMY WITH RADIOACTIVE SEED AND SENTINEL LYMPH NODE BIOPSY by Dr Marlou Starks   07/26/2020 Pathology Results   FINAL MICROSCOPIC DIAGNOSIS:   A. LYMPH NODE, RIGHT AXILLARY #1, SENTINEL, EXCISION:  - Lymph node, negative for carcinoma (0/1)   B. LYMPH NODE, RIGHT AXILLARY, SENTINEL, EXCISION:  - Benign fibroadipose tissue, negative for carcinoma   C. BREAST, RIGHT, LUMPECTOMY:  - Invasive ductal carcinoma, 1.5 cm, grade 2  - Ductal carcinoma in situ, low grade  - Resection margins are negative for carcinoma; closest is the anterior  margin of 0.2 cm  - Biopsy site changes  - See oncology table   D. BREAST, LEFT, LUMPECTOMY:  - Benign breast parenchyma with prominent biopsy-related changes  - Negative for residual ductal carcinoma in situ  - See oncology table   E. LYMPH NODE, RIGHT AXILLARY #2, SENTINEL, EXCISION:  - Invasive ductal carcinoma, see comment     COMMENT:   E.  Lymph node tissue is not identified.  Findings likely represent an  entirely replaced lymph node with foci of extranodal extension.    07/26/2020 Miscellaneous   Mammaprint High Risk of Luminla Type B 29% risk of recurrence in 10 years if untreated.  Her Mammaprint index is -0.175 She has 94.6% benefit of chemotherapy  and hormaonal therapy.      08/20/2020 Imaging   CT C/A/P IMPRESSION: 1. No definitive imaging findings to suggest metastatic disease in the chest, abdomen or pelvis. 2. Postoperative changes of bilateral lumpectomy and right axillary lymph node dissection with what appears to be a large postoperative seroma  in the right axilla, as detailed above. Attention on follow-up studies is recommended to ensure the stability or regression of this collection. 3. Additional incidental findings, as above.   08/28/2020 Imaging   Bone Scan IMPRESSION: No definite scintigraphic evidence of osseous metastases.   08/30/2020 -  Chemotherapy   Adjuvant Docetaxel and Cytoxan q3weeks for 4 cycles starting 08/30/20.        Ductal carcinoma in situ (DCIS) of left breast  06/08/2020 Mammogram   IMPRESSION: 1. 9 x 7 x 6 mm mass in the 12 o'clock position of the right breast, 2cmfn with imaging features highly suspicious for malignancy. 2. 4 mm group of indeterminate calcifications in the 12 o'clock position of the left breast and 4 mm group of indeterminate calcifications in the 1 o'clock position of the left breast. Together, the groups span an area measuring 3.9 cm.   06/21/2020 Cancer Staging   Staging form: Breast, AJCC 8th Edition - Clinical stage from 06/21/2020: Stage 0 (cTis (DCIS), cN0, cM0, G2, ER+, PR+, HER2: Not Assessed) - Signed by Truitt Merle, MD on 06/26/2020 Stage prefix: Initial diagnosis   06/21/2020 Initial Biopsy   Diagnosis 1. Breast, right, needle core biopsy, 12 oc - INVASIVE MAMMARY CARCINOMA - MAMMARY CARCINOMA IN SITU - SEE COMMENT 2. Breast, left, needle core biopsy, 12 oc - MAMMARY CARCINOMA IN-SITU WITH NECROSIS AND CALCIFICATIONS - SEE COMMENT 3. Breast, left, needle core biopsy, 1 oc - MAMMARY CARCINOMA IN-SITU WITH NECROSIS AND CALCIFICATIONS - SEE COMMENT Microscopic Comment 1. The biopsy material shows an infiltrative proliferation of cells with large vesicular nuclei with inconspicuous nucleoli, arranged linearly and in small clusters. Based on the biopsy, the carcinoma appears Nottingham grade 2 of 3 and measures 0.8 cm in greatest linear extent. E-cadherin and prognostic markers (ER/PR/ki-67/HER2)are pending and will be reported in an addendum. Dr. Jeannie Done reviewed the  case and agrees with the above diagnosis. These results were called to [facility] on June 22, 2020. 2. and 3. E-cadherin is pending and will be reported in an addendum.     1. E-cadherin is POSITIVE supporting a ductal origin. 2. E-cadherin is POSITIVE supporting a ductal origin. A prognostic panel (ER/PR) is pending and will be reported in an addendum 3. E-cadherin is positive supporting a ductal origin. The focus is less pronounced on the deeper sections and in isolation would likely be considered atypical ductal hyperplasia.   06/25/2020 Initial Diagnosis   Ductal carcinoma in situ (DCIS) of left breast    Receptors her2   2. PROGNOSTIC INDICATORS Results: IMMUNOHISTOCHEMICAL AND MORPHOMETRIC ANALYSIS PERFORMED MANUALLY Estrogen Receptor: 95%, POSITIVE, STRONG STAINING INTENSITY Progesterone Receptor: 30%, POSITIVE, STRONG STAINING INTENSITY   07/03/2020 Breast MRI   IMPRESSION: 1. Known RIGHT breast cancer, 12 o'clock axis, at anterior depth, measuring 1 cm greatest extent, manifesting as a spiculated enhancing mass on MRI, with associated biopsy clip. Expected post biopsy changes are seen within the adjacent outer RIGHT breast. 2. No evidence of additional multifocal or multicentric disease within the RIGHT breast. 3. Known LEFT breast DCIS within the slightly outer LEFT breast, at anterior depth, corresponding to the biopsy site labeled 1 o'clock axis, with associated enhancement only  at the margins of the biopsy cavity measuring up to 5 mm greatest dimension. 4. Known LEFT breast DCIS within the upper central LEFT breast, at middle depth, corresponding to the biopsy site labeled 12 o'clock axis. Contiguous linear non-mass enhancement extends 2.3 cm superior-medial to the biopsy cavity, most likely post biopsy change but possibly contiguous extent of disease. 5. No evidence of additional multifocal or multicentric disease within the LEFT  breast. 6. No evidence of metastatic lymphadenopathy.     07/26/2020 Surgery   LEFT BREAST LUMPECTOMY X 2  WITH RADIOACTIVE SEED LOCALIZATION by Dr Marlou Starks   07/26/2020 Pathology Results   FINAL MICROSCOPIC DIAGNOSIS:   A. LYMPH NODE, RIGHT AXILLARY #1, SENTINEL, EXCISION:  - Lymph node, negative for carcinoma (0/1)   B. LYMPH NODE, RIGHT AXILLARY, SENTINEL, EXCISION:  - Benign fibroadipose tissue, negative for carcinoma   C. BREAST, RIGHT, LUMPECTOMY:  - Invasive ductal carcinoma, 1.5 cm, grade 2  - Ductal carcinoma in situ, low grade  - Resection margins are negative for carcinoma; closest is the anterior  margin of 0.2 cm  - Biopsy site changes  - See oncology table   D. BREAST, LEFT, LUMPECTOMY:  - Benign breast parenchyma with prominent biopsy-related changes  - Negative for residual ductal carcinoma in situ  - See oncology table   E. LYMPH NODE, RIGHT AXILLARY #2, SENTINEL, EXCISION:  - Invasive ductal carcinoma, see comment     COMMENT:   E.  Lymph node tissue is not identified.  Findings likely represent an  entirely replaced lymph node with foci of extranodal extension.      CURRENT THERAPY: Adjuvant Docetaxel and Cytoxan q3weeks for 4 cycles starting 08/30/20. Given skin rash, changes taxol to Abraxane starting with C2 (09/21/20), no pegfilgrastim   INTERVAL HISTORY: Ms. Rames returns for follow up/toxicity check as scheduled. She is s/p cycle 2 day 5 chemo with change to abraxane. She had tingling in her lip driving home, took benadryl then resolved. On day 2 she had flushed cheeks that were puffy and hot; also resolved with benadryl. No fever/chills. She has dry scaly skin on her hands, no other rash. On day 4 she had one episode of nausea without vomiting that resolved with medication. Constipation lasted longer because she used mag citrate without warm water, then had large BM. She is taking multivitamin, D3, B12, K, Zinc, ubiquinol CoQH, and glutathione.     MEDICAL HISTORY:  Past Medical History:  Diagnosis Date  . Anal fissure   . Atrial septal aneurysm / if pfo  echo 6 13  10/23/2011  . Breast cancer ([place])   . Cataract    both eyes  . Depression   . Fatty liver    "pre fatty liver"  . Fibromyalgia   . Headache(784.0)    hx of migraines when younger  . Heart palpitations    hx with normal holter event monitoring  . Hx: UTI (urinary tract infection)   . Hyperlipidemia   . Hypertension   . Hypothyroidism   . Obesity   . Pneumonia 1972   hx of  . Polyp of colon   . 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 Skin cancer    basal, squamous cell  . Sleep apnea    uses cpap    SURGICAL HISTORY: Past Surgical History:  Procedure Laterality Date  . BREAST LUMPECTOMY WITH RADIOACTIVE SEED AND SENTINEL LYMPH NODE BIOPSY Right 07/26/2020   Procedure: RIGHT BREAST LUMPECTOMY WITH RADIOACTIVE SEED AND SENTINEL LYMPH  NODE BIOPSY;  Surgeon: Jovita Kussmaul, MD;  Location: [facility];  Service: General;  Laterality: Right;  . BREAST LUMPECTOMY WITH RADIOACTIVE SEED LOCALIZATION Left 07/26/2020   Procedure: LEFT BREAST LUMPECTOMY X 2  WITH RADIOACTIVE SEED LOCALIZATION;  Surgeon: Jovita Kussmaul, MD;  Location: [facility];  Service: General;  Laterality: Left;  . COLONOSCOPY    . DILATION AND CURETTAGE OF UTERUS  1978  . [place]  . KNEE ARTHROSCOPY     both in past  . POLYPECTOMY    . SHOULDER OPEN ROTATOR CUFF REPAIR Left 07/07/2013   Procedure: LEFT SHOULDER MINI OPEN SUBACROMIAL DECOMPRESSION ROTATOR CUFF REPAIR AND POSSIBLE PATCH GRAFT ;  Surgeon: Johnn Hai, MD;  Location: [facility];  Service: Orthopedics;  Laterality: Left;  with interscaline block  . SKIN CANCER EXCISION Bilateral    arm, legs, and chest  . TONSILLECTOMY      I have reviewed the social history and family history with the patient and they are unchanged from previous note.  ALLERGIES:  is allergic to penicillins, taxotere [docetaxel], cefdinir, norvasc  [amlodipine besylate], cetirizine, irbesartan, molds & smuts, pegfilgrastim, and tylenol [acetaminophen].  MEDICATIONS:  Current Outpatient Medications  Medication Sig Dispense Refill  . Ascorbic Acid (VITAMIN C) 1000 MG tablet Take 1,000 mg by mouth daily.    Marland Kitchen b complex vitamins capsule Take 1 capsule by mouth daily.    . Bioflavonoid Products (QUERCETIN COMPLEX IMMUNE PO) Take 1 capsule by mouth daily.    . Cholecalciferol (VITAMIN D) 50 MCG (2000 UT) tablet Take 4,000 Units by mouth daily.    Marland Kitchen dexamethasone (DECADRON) 4 MG tablet Take 2 tablets (8 mg total) by mouth 2 (two) times daily. Start the day before Taxotere. Then again the day after chemo for 3 days. 30 tablet 1  . hydrochlorothiazide (HYDRODIURIL) 25 MG tablet TAKE 1 TABLET BY MOUTH EVERY DAY 90 tablet 0  . hydroxychloroquine (PLAQUENIL) 200 MG tablet Take 200 mg by mouth 2 (two) times a week.    . lidocaine-prilocaine (EMLA) cream Apply to affected area once 30 g 3  . LORazepam (ATIVAN) 1 MG tablet Take 0.5-1 tablets (0.5-1 mg total) by mouth every 8 (eight) hours as needed for anxiety. anxiety 20 tablet 0  . MAGNESIUM CARBONATE PO Take 1 tablet by mouth daily.    . melatonin 5 MG TABS Take 5 mg by mouth at bedtime.    . Menaquinone-7 (VITAMIN K2) 100 MCG CAPS Take 100 mcg by mouth daily.    . metoprolol succinate (TOPROL XL) 25 MG 24 hr tablet TAKE ONCE A DAY AS DIRECTED (Patient taking differently: Take by mouth daily as needed (Blood pressure). TAKE ONCE A DAY AS DIRECTED) 30 tablet 1  . Multiple Vitamin (MULTIVITAMIN) tablet Take 1 tablet by mouth daily.    . NON FORMULARY Take 48.6 mg by mouth See admin instructions. Thyroid desiccated (procine) sr 2 capsules 5 days a week, 3 capsules 2 days per week (Tues and Fri)    . OLIVE LEAF EXTRACT PO Take 1 capsule by mouth daily.    . ondansetron (ZOFRAN) 8 MG tablet Take 1 tablet (8 mg total) by mouth 2 (two) times daily as needed for refractory nausea / vomiting. Start on day 3  after chemo. 30 tablet 1  . OVER THE COUNTER MEDICATION Take 2 capsules by mouth in the morning and at bedtime. Ifnla Med    . OVER THE COUNTER MEDICATION Take 1 capsule by  mouth daily. Essential Pro    . potassium chloride SA (KLOR-CON) 20 MEQ tablet TAKE ONE TABLET BY MOUTH ONE TIME DAILY 90 tablet 0  . Probiotic Product (PROBIOTIC PO) Take 1 capsule by mouth daily.    . prochlorperazine (COMPAZINE) 10 MG tablet Take 1 tablet (10 mg total) by mouth every 6 (six) hours as needed (Nausea or vomiting). 30 tablet 1  . traMADol (ULTRAM) 50 MG tablet Take 1-2 tablets (50-100 mg total) by mouth every 6 (six) hours as needed. 20 tablet 1  . UBIQUINOL PO Take by mouth.    Marland Kitchen VITAMIN E PO Take 23 mg by mouth daily.    Marland Kitchen ZINC GLUCONATE PO Take 10 mg by mouth daily.     No current facility-administered medications for this visit.    PHYSICAL EXAMINATION: ECOG PERFORMANCE STATUS: 1 - Symptomatic but completely ambulatory  Vitals:   09/25/20 1049  BP: 131/62  Pulse: 91  Resp: 16  Temp: (!) 97 F (36.1 C)  SpO2: 99%   Filed Weights   09/25/20 1049  Weight: 203 lb 11.2 oz (92.4 kg)    GENERAL:alert, no distress and comfortable SKIN: localized raised eruption to left forearm/wrist. Dry flaky/scaly skin to thumbs and first fingers bilaterally  EYES: sclera clear OROPHARYNX: no perioral or angioedema  LUNGS:  normal breathing effort HEART:  no lower extremity edema NEURO: alert & oriented x 3 with fluent speech, no focal motor/sensory deficits Breast exam deferred   LABORATORY DATA:  I have reviewed the data as listed CBC Latest Ref Rng & Units 09/25/2020 09/21/2020 09/08/2020  WBC 4.0 - 10.5 K/uL 5.8 7.5 39.4(H)  Hemoglobin 12.0 - 15.0 g/dL 12.9 12.2 12.2  Hematocrit 36.0 - 46.0 % 38.0 37.1 37.0  Platelets 150 - 400 K/uL 212 285 172     CMP Latest Ref Rng & Units 09/25/2020 09/21/2020 09/08/2020  Glucose 70 - 99 mg/dL 103(H) 91 103(H)  BUN 8 - 23 mg/dL 21 21 30(H)  Creatinine 0.44 -  1.00 mg/dL 0.89 0.74 0.99  Sodium 135 - 145 mmol/L 138 138 139  Potassium 3.5 - 5.1 mmol/L 3.8 4.1 3.9  Chloride 98 - 111 mmol/L 99 103 104  CO2 22 - 32 mmol/L $RemoveB'30 28 26  'ecvwompc$ Calcium 8.9 - 10.3 mg/dL 9.9 9.3 9.0  Total Protein 6.5 - 8.1 g/dL 7.0 6.8 -  Total Bilirubin 0.3 - 1.2 mg/dL 0.9 0.7 -  Alkaline Phos 38 - 126 U/L 112 130(H) -  AST 15 - 41 U/L 32 32 -  ALT 0 - 44 U/L 54(H) 56(H) -      RADIOGRAPHIC STUDIES: I have personally reviewed the radiological images as listed and agreed with the findings in the report. No results found.   ASSESSMENT & PLAN: Anjanette Gilkey Pendletonis a 65 y.o.femalewith   1.Malignant neoplasm of upper-outer quadrant of right breast, StageIB,p(T1cN1aM0), ER+/PR+/HER2-, GradeIIANDLeftbreast DCIS, gradeII, ER+/PR+(removed by biopsy) -She was diagnosed in 06/2020.She was found to have b/l masses on mammogram. On biopsy she was seen to have2 small area ofDCIS inleft breast and 0.8cm right breast invasive ductal carcinoma. -She underwent B/l lumpectomy and right SNLB by Dr Marlou Starks on 07/26/20. Her surgical path showed her left breast DCIS was completely removed with biopsy and no residual DCIS on path. Her grade II, 1.5cm invasive ductal carcinoma of right breast was completely removed with clear margins and 1/2 positive LNs.  -Given positive LN, Mammaprint was done and results show high risk of Luminal Type B with 29% risk  ofdistantrecurrence.She has 94.6%chance of 5-year recurrence free withchemotherapy andhormonaltherapy. -Staging CT C/A/P on 08/20/20 and bone scan on 08/28/20 were negative for metastatic disease. -She began adjuvant docetaxel and Cytoxan with G-CSF on 08/30/2020, dose reduced by 15% for mild transaminitis  -She developed a chemo reaction to docetaxel, with flushing, throat tightness, and chest pressure.  Symptoms eventually resolved with medication and she was able to complete the infusion.  She otherwise tolerated chemo well.   Unfortunately she has severe bone pain from G-CSF that required ED visit -due to potential taxotere drug rash, she was changed to Abraxane with Cytoxan q3 weeks from C2.  The goal remains curative. Will stop GCSF -Patient declined PAC  -C2 with abraxane and cytoxan, no GCSF, tolerated much better   2. Comorbidities:Depression, Fibromyalgia, HLD, HTN, Hypothyroidism, H/o Skin cancer(squamous and basal cell), sleep apnea  -She notes having Adrenal fatigue, Dr. Burr Medico previously recommend she see endocrinologist -She is followed for HTN -she is on ubiquinol CoQH supplement for her heart   3. Genetic testing, she will think about it.  4.Fatty liver and transaminitis -She has chronic intermittent transaminitis, which she attributes to fatty liver.She is on low sugar diet and drinks a lot of water, and takes glutathione  -Liver morphology was unremarkable on recent CT scan -She does not drink alcohol, she knows to avoid Tylenol. -She had mild transaminitis on 08/30/2020, cycle 1 chemo dose reduced.  LFTs improved today subsequently    Disposition:  Ms. Salatino appears stable. She completed cycle 2 chemo with abraxane/cytoxan. She experienced mild cytokine release symptoms after infusion and over the next 24 hours, including flushing, mild swelling, and oral tingling. All symptoms resolved with benadryl. I recommend to add as pre-med for subsequent cycles.   She tolerated abraxane/cytoxan much better, mild nausea, constipation, and dry skin. Side effects well managed with supportive care at home. She is able to recover and function well.   Labs reviewed. We discussed expected CBC trends after chemo and infection control measures. She has not been vaccinated against covid, but recovered from it in early 2022, I discussed her natural immunity is likely not as strong anymore. She will use precautions.   Will check DDIs with supplements on chemo.   F/up with cycle 3 on 6/3.   All  questions were answered. The patient knows to call the clinic with any problems, questions or concerns. No barriers to learning were detected.     Alla Feeling, NP 09/25/20

## 2020-09-24 NOTE — Telephone Encounter (Signed)
Called pt to see how she did with her recent new treatment.  She reports that she has done better than with previous treatment.  She has had some nausea & some constipation but states that nausea med helped & she knows what to do for constipation.  She had a concern about some ? Fluid collection & will discuss with Lacie NP tomorrow at visit.  She knows how to reach [place] with concerns.

## 2020-09-25 ENCOUNTER — Inpatient Hospital Stay: Payer: Medicare HMO

## 2020-09-25 ENCOUNTER — Other Ambulatory Visit: Payer: Self-pay

## 2020-09-25 ENCOUNTER — Encounter: Payer: Self-pay | Admitting: Nurse Practitioner

## 2020-09-25 ENCOUNTER — Inpatient Hospital Stay: Payer: Medicare HMO | Admitting: Nurse Practitioner

## 2020-09-25 VITALS — BP 131/62 | HR 91 | Temp 97.0°F | Resp 16 | Ht 66.0 in | Wt 203.7 lb

## 2020-09-25 DIAGNOSIS — Z170 Estrogen receptor positive status [ER+]: Secondary | ICD-10-CM

## 2020-09-25 DIAGNOSIS — Z5111 Encounter for antineoplastic chemotherapy: Secondary | ICD-10-CM | POA: Diagnosis not present

## 2020-09-25 DIAGNOSIS — E039 Hypothyroidism, unspecified: Secondary | ICD-10-CM | POA: Diagnosis not present

## 2020-09-25 DIAGNOSIS — C50411 Malignant neoplasm of upper-outer quadrant of right female breast: Secondary | ICD-10-CM

## 2020-09-25 DIAGNOSIS — M797 Fibromyalgia: Secondary | ICD-10-CM | POA: Diagnosis not present

## 2020-09-25 DIAGNOSIS — G629 Polyneuropathy, unspecified: Secondary | ICD-10-CM | POA: Diagnosis not present

## 2020-09-25 DIAGNOSIS — I10 Essential (primary) hypertension: Secondary | ICD-10-CM | POA: Diagnosis not present

## 2020-09-25 DIAGNOSIS — G4730 Sleep apnea, unspecified: Secondary | ICD-10-CM | POA: Diagnosis not present

## 2020-09-25 DIAGNOSIS — D0512 Intraductal carcinoma in situ of left breast: Secondary | ICD-10-CM

## 2020-09-25 DIAGNOSIS — E785 Hyperlipidemia, unspecified: Secondary | ICD-10-CM | POA: Diagnosis not present

## 2020-09-25 LAB — CBC WITH DIFFERENTIAL (CANCER CENTER ONLY)
Abs Immature Granulocytes: 0.03 10*3/uL (ref 0.00–0.07)
Basophils Absolute: 0 10*3/uL (ref 0.0–0.1)
Basophils Relative: 1 %
Eosinophils Absolute: 0.1 10*3/uL (ref 0.0–0.5)
Eosinophils Relative: 2 %
HCT: 38 % (ref 36.0–46.0)
Hemoglobin: 12.9 g/dL (ref 12.0–15.0)
Immature Granulocytes: 1 %
Lymphocytes Relative: 22 %
Lymphs Abs: 1.3 10*3/uL (ref 0.7–4.0)
MCH: 30.8 pg (ref 26.0–34.0)
MCHC: 33.9 g/dL (ref 30.0–36.0)
MCV: 90.7 fL (ref 80.0–100.0)
Monocytes Absolute: 0.2 10*3/uL (ref 0.1–1.0)
Monocytes Relative: 3 %
Neutro Abs: 4.2 10*3/uL (ref 1.7–7.7)
Neutrophils Relative %: 71 %
Platelet Count: 212 10*3/uL (ref 150–400)
RBC: 4.19 MIL/uL (ref 3.87–5.11)
RDW: 13.8 % (ref 11.5–15.5)
WBC Count: 5.8 10*3/uL (ref 4.0–10.5)
nRBC: 0 % (ref 0.0–0.2)

## 2020-09-25 LAB — CMP (CANCER CENTER ONLY)
ALT: 54 U/L — ABNORMAL HIGH (ref 0–44)
AST: 32 U/L (ref 15–41)
Albumin: 4.1 g/dL (ref 3.5–5.0)
Alkaline Phosphatase: 112 U/L (ref 38–126)
Anion gap: 9 (ref 5–15)
BUN: 21 mg/dL (ref 8–23)
CO2: 30 mmol/L (ref 22–32)
Calcium: 9.9 mg/dL (ref 8.9–10.3)
Chloride: 99 mmol/L (ref 98–111)
Creatinine: 0.89 mg/dL (ref 0.44–1.00)
GFR, Estimated: >60 mL/min (ref >60)
Glucose, Bld: 103 mg/dL — ABNORMAL HIGH (ref 70–99)
Potassium: 3.8 mmol/L (ref 3.5–5.1)
Sodium: 138 mmol/L (ref 135–145)
Total Bilirubin: 0.9 mg/dL (ref 0.3–1.2)
Total Protein: 7 g/dL (ref 6.5–8.1)

## 2020-09-25 NOTE — Progress Notes (Signed)
Left message for pt to call back. 09/25/20 1:40 pm MWilson, RN

## 2020-09-26 DIAGNOSIS — R69 Illness, unspecified: Secondary | ICD-10-CM | POA: Diagnosis not present

## 2020-10-04 NOTE — Progress Notes (Signed)
Reached pt by phone.  She tells me she changed cardiologists from Dr. Harrington Challenger to Dr. Dina Rich at [facility].

## 2020-10-10 NOTE — Progress Notes (Signed)
[place]   [phone] [phone]) [phone]   Clinic Follow up Note   Patient Care Team: Panosh, Standley Brooking, MD as PCP - General Arvella Nigh, MD as Consulting Physician (Obstetrics and Gynecology) Dr Peggye Form, MD as Consulting Physician (Orthopedic Surgery) Ladene Artist, MD as Consulting Physician (Gastroenterology) Melina Schools, MD as Consulting Physician (Orthopedic Surgery) Rockwell Germany, RN as Oncology Nurse Navigator Mauro Kaufmann, RN as Oncology Nurse Navigator Jovita Kussmaul, MD as Consulting Physician (General Surgery) Truitt Merle, MD as Consulting Physician (Hematology) Eppie Gibson, MD as Attending Physician (Radiation Oncology) Jari Pigg, MD as Consulting Physician (Dermatology) Serita Kyle, MD as Referring Physician (Cardiology)  Date of Service:  10/12/2020  CHIEF COMPLAINT: f/u ofLeft breast DCIS and Right breast cancer  SUMMARY OF ONCOLOGIC HISTORY: Oncology History Overview Note  Cancer Staging Ductal carcinoma in situ (DCIS) of left breast Staging form: Breast, AJCC 8th Edition - Clinical stage from 06/21/2020: Stage 0 (cTis (DCIS), cN0, cM0, G2, ER+, PR+, HER2: Not Assessed) - Signed by Truitt Merle, MD on 06/26/2020 Stage prefix: Initial diagnosis  Malignant neoplasm of upper-outer quadrant of right breast in female, estrogen receptor positive ([place]) Staging form: Breast, AJCC 8th Edition - Clinical stage from 06/21/2020: Stage IA (cT1b, cN0, cM0, G2, ER+, PR+, HER2-) - Signed by Truitt Merle, MD on 06/26/2020 Stage prefix: Initial diagnosis - Pathologic stage from 07/26/2020: Stage IA (pT1c, pN1a, cM0, G2, ER+, PR+, HER2-) - Signed by Truitt Merle, MD on 08/09/2020 Stage prefix: Initial diagnosis Nuclear grade: G2 Histologic grading system: 3 grade system Residual tumor (R): R0 - None    Malignant neoplasm of upper-outer quadrant of right breast in female, estrogen receptor positive ([place])  06/08/2020 Mammogram    IMPRESSION: 1. 9 x 7 x 6 mm mass in the 12 o'clock position of the right breast, 2cmfn with imaging features highly suspicious for malignancy. 2. 4 mm group of indeterminate calcifications in the 12 o'clock position of the left breast and 4 mm group of indeterminate calcifications in the 1 o'clock position of the left breast. Together, the groups span an area measuring 3.9 cm.   06/21/2020 Cancer Staging   Staging form: Breast, AJCC 8th Edition - Clinical stage from 06/21/2020: Stage IA (cT1b, cN0, cM0, G2, ER+, PR+, HER2-) - Signed by Truitt Merle, MD on 06/26/2020 Stage prefix: Initial diagnosis   06/21/2020 Initial Biopsy   Diagnosis 1. Breast, right, needle core biopsy, 12 oc - INVASIVE MAMMARY CARCINOMA - MAMMARY CARCINOMA IN SITU - SEE COMMENT 2. Breast, left, needle core biopsy, 12 oc - MAMMARY CARCINOMA IN-SITU WITH NECROSIS AND CALCIFICATIONS - SEE COMMENT 3. Breast, left, needle core biopsy, 1 oc - MAMMARY CARCINOMA IN-SITU WITH NECROSIS AND CALCIFICATIONS - SEE COMMENT Microscopic Comment 1. The biopsy material shows an infiltrative proliferation of cells with large vesicular nuclei with inconspicuous nucleoli, arranged linearly and in small clusters. Based on the biopsy, the carcinoma appears Nottingham grade 2 of 3 and measures 0.8 cm in greatest linear extent. E-cadherin and prognostic markers (ER/PR/ki-67/HER2)are pending and will be reported in an addendum. Dr. Jeannie Done reviewed the case and agrees with the above diagnosis. These results were called to [facility] on June 22, 2020. 2. and 3. E-cadherin is pending and will be reported in an addendum.     1. E-cadherin is POSITIVE supporting a ductal origin. 2. E-cadherin is POSITIVE supporting a ductal origin. A prognostic panel (ER/PR) is pending and will be  reported in an addendum 3. E-cadherin is positive supporting a ductal origin. The focus is less pronounced on the deeper sections and in  isolation would likely be considered atypical ductal hyperplasia.   06/21/2020 Receptors her2   1. PROGNOSTIC INDICATORS Results: IMMUNOHISTOCHEMICAL AND MORPHOMETRIC ANALYSIS PERFORMED MANUALLY The tumor cells are EQUIVOCAL for Her2 (2+). HER2 by FISH will be performed and the results reported separately Estrogen Receptor: 95%, POSITIVE, STRONG STAINING INTENSITY Progesterone Receptor: 40%, POSITIVE, MODERATE STAINING INTENSITY Proliferation Marker Ki67: 10%  1. FLUORESCENCE IN-SITU HYBRIDIZATION Results: GROUP 5: HER2 **NEGATIVE** Equivocal form of amplification of the HER2 gene was detected in the IHC 2+ tissue sample received from this individual. HER2 FISH was performed by a technologist and cell imaging and analysis on the [employer].   06/25/2020 Initial Diagnosis   Malignant neoplasm of upper-outer quadrant of right breast in female, estrogen receptor positive ([place])   07/03/2020 Breast MRI   IMPRESSION: 1. Known RIGHT breast cancer, 12 o'clock axis, at anterior depth, measuring 1 cm greatest extent, manifesting as a spiculated enhancing mass on MRI, with associated biopsy clip. Expected post biopsy changes are seen within the adjacent outer RIGHT breast. 2. No evidence of additional multifocal or multicentric disease within the RIGHT breast. 3. Known LEFT breast DCIS within the slightly outer LEFT breast, at anterior depth, corresponding to the biopsy site labeled 1 o'clock axis, with associated enhancement only at the margins of the biopsy cavity measuring up to 5 mm greatest dimension. 4. Known LEFT breast DCIS within the upper central LEFT breast, at middle depth, corresponding to the biopsy site labeled 12 o'clock axis. Contiguous linear non-mass enhancement extends 2.3 cm superior-medial to the biopsy cavity, most likely post biopsy change but possibly contiguous extent of disease. 5. No evidence of additional multifocal or multicentric disease within the LEFT  breast. 6. No evidence of metastatic lymphadenopathy.     07/26/2020 Cancer Staging   Staging form: Breast, AJCC 8th Edition - Pathologic stage from 07/26/2020: Stage IA (pT1c, pN1a, cM0, G2, ER+, PR+, HER2-) - Signed by Truitt Merle, MD on 08/09/2020 Stage prefix: Initial diagnosis Nuclear grade: G2 Histologic grading system: 3 grade system Residual tumor (R): R0 - None   07/26/2020 Surgery   RIGHT BREAST LUMPECTOMY WITH RADIOACTIVE SEED AND SENTINEL LYMPH NODE BIOPSY by Dr Marlou Starks   07/26/2020 Pathology Results   FINAL MICROSCOPIC DIAGNOSIS:   A. LYMPH NODE, RIGHT AXILLARY #1, SENTINEL, EXCISION:  - Lymph node, negative for carcinoma (0/1)   B. LYMPH NODE, RIGHT AXILLARY, SENTINEL, EXCISION:  - Benign fibroadipose tissue, negative for carcinoma   C. BREAST, RIGHT, LUMPECTOMY:  - Invasive ductal carcinoma, 1.5 cm, grade 2  - Ductal carcinoma in situ, low grade  - Resection margins are negative for carcinoma; closest is the anterior  margin of 0.2 cm  - Biopsy site changes  - See oncology table   D. BREAST, LEFT, LUMPECTOMY:  - Benign breast parenchyma with prominent biopsy-related changes  - Negative for residual ductal carcinoma in situ  - See oncology table   E. LYMPH NODE, RIGHT AXILLARY #2, SENTINEL, EXCISION:  - Invasive ductal carcinoma, see comment     COMMENT:   E.  Lymph node tissue is not identified.  Findings likely represent an  entirely replaced lymph node with foci of extranodal extension.    07/26/2020 Miscellaneous   Mammaprint High Risk of Luminla Type B 29% risk of recurrence in 10 years if untreated.  Her Mammaprint index is -0.175 She has 94.6% benefit  of chemotherapy and hormaonal therapy.      08/20/2020 Imaging   CT C/A/P IMPRESSION: 1. No definitive imaging findings to suggest metastatic disease in the chest, abdomen or pelvis. 2. Postoperative changes of bilateral lumpectomy and right axillary lymph node dissection with what appears to be a  large postoperative seroma in the right axilla, as detailed above. Attention on follow-up studies is recommended to ensure the stability or regression of this collection. 3. Additional incidental findings, as above.   08/28/2020 Imaging   Bone Scan IMPRESSION: No definite scintigraphic evidence of osseous metastases.   08/30/2020 -  Chemotherapy   Adjuvant Docetaxel and Cytoxan q3weeks for 4 cycles starting 08/30/20. Given skin rash, changes taxol to Abraxane starting with C2 (09/21/20).        Ductal carcinoma in situ (DCIS) of left breast  06/08/2020 Mammogram   IMPRESSION: 1. 9 x 7 x 6 mm mass in the 12 o'clock position of the right breast, 2cmfn with imaging features highly suspicious for malignancy. 2. 4 mm group of indeterminate calcifications in the 12 o'clock position of the left breast and 4 mm group of indeterminate calcifications in the 1 o'clock position of the left breast. Together, the groups span an area measuring 3.9 cm.   06/21/2020 Cancer Staging   Staging form: Breast, AJCC 8th Edition - Clinical stage from 06/21/2020: Stage 0 (cTis (DCIS), cN0, cM0, G2, ER+, PR+, HER2: Not Assessed) - Signed by Truitt Merle, MD on 06/26/2020 Stage prefix: Initial diagnosis   06/21/2020 Initial Biopsy   Diagnosis 1. Breast, right, needle core biopsy, 12 oc - INVASIVE MAMMARY CARCINOMA - MAMMARY CARCINOMA IN SITU - SEE COMMENT 2. Breast, left, needle core biopsy, 12 oc - MAMMARY CARCINOMA IN-SITU WITH NECROSIS AND CALCIFICATIONS - SEE COMMENT 3. Breast, left, needle core biopsy, 1 oc - MAMMARY CARCINOMA IN-SITU WITH NECROSIS AND CALCIFICATIONS - SEE COMMENT Microscopic Comment 1. The biopsy material shows an infiltrative proliferation of cells with large vesicular nuclei with inconspicuous nucleoli, arranged linearly and in small clusters. Based on the biopsy, the carcinoma appears Nottingham grade 2 of 3 and measures 0.8 cm in greatest linear extent. E-cadherin and prognostic  markers (ER/PR/ki-67/HER2)are pending and will be reported in an addendum. Dr. Jeannie Done reviewed the case and agrees with the above diagnosis. These results were called to [facility] on June 22, 2020. 2. and 3. E-cadherin is pending and will be reported in an addendum.     1. E-cadherin is POSITIVE supporting a ductal origin. 2. E-cadherin is POSITIVE supporting a ductal origin. A prognostic panel (ER/PR) is pending and will be reported in an addendum 3. E-cadherin is positive supporting a ductal origin. The focus is less pronounced on the deeper sections and in isolation would likely be considered atypical ductal hyperplasia.   06/25/2020 Initial Diagnosis   Ductal carcinoma in situ (DCIS) of left breast    Receptors her2   2. PROGNOSTIC INDICATORS Results: IMMUNOHISTOCHEMICAL AND MORPHOMETRIC ANALYSIS PERFORMED MANUALLY Estrogen Receptor: 95%, POSITIVE, STRONG STAINING INTENSITY Progesterone Receptor: 30%, POSITIVE, STRONG STAINING INTENSITY   07/03/2020 Breast MRI   IMPRESSION: 1. Known RIGHT breast cancer, 12 o'clock axis, at anterior depth, measuring 1 cm greatest extent, manifesting as a spiculated enhancing mass on MRI, with associated biopsy clip. Expected post biopsy changes are seen within the adjacent outer RIGHT breast. 2. No evidence of additional multifocal or multicentric disease within the RIGHT breast. 3. Known LEFT breast DCIS within the slightly outer LEFT breast, at anterior depth,  corresponding to the biopsy site labeled 1 o'clock axis, with associated enhancement only at the margins of the biopsy cavity measuring up to 5 mm greatest dimension. 4. Known LEFT breast DCIS within the upper central LEFT breast, at middle depth, corresponding to the biopsy site labeled 12 o'clock axis. Contiguous linear non-mass enhancement extends 2.3 cm superior-medial to the biopsy cavity, most likely post biopsy change but possibly contiguous extent of  disease. 5. No evidence of additional multifocal or multicentric disease within the LEFT breast. 6. No evidence of metastatic lymphadenopathy.     07/26/2020 Surgery   LEFT BREAST LUMPECTOMY X 2  WITH RADIOACTIVE SEED LOCALIZATION by Dr Marlou Starks   07/26/2020 Pathology Results   FINAL MICROSCOPIC DIAGNOSIS:   A. LYMPH NODE, RIGHT AXILLARY #1, SENTINEL, EXCISION:  - Lymph node, negative for carcinoma (0/1)   B. LYMPH NODE, RIGHT AXILLARY, SENTINEL, EXCISION:  - Benign fibroadipose tissue, negative for carcinoma   C. BREAST, RIGHT, LUMPECTOMY:  - Invasive ductal carcinoma, 1.5 cm, grade 2  - Ductal carcinoma in situ, low grade  - Resection margins are negative for carcinoma; closest is the anterior  margin of 0.2 cm  - Biopsy site changes  - See oncology table   D. BREAST, LEFT, LUMPECTOMY:  - Benign breast parenchyma with prominent biopsy-related changes  - Negative for residual ductal carcinoma in situ  - See oncology table   E. LYMPH NODE, RIGHT AXILLARY #2, SENTINEL, EXCISION:  - Invasive ductal carcinoma, see comment     COMMENT:   E.  Lymph node tissue is not identified.  Findings likely represent an  entirely replaced lymph node with foci of extranodal extension.       CURRENT THERAPY:  Adjuvant Docetaxel and Cytoxan q3weeks for 4 cycles starting 08/30/20. Given skin rash, changes taxol to Abraxane starting with C2 (09/21/20).   INTERVAL HISTORY:  LAYALI FREUND is here for a follow up of left breast cancer. She was last seen by me 09/21/20. She presents to the clinic with her husband. She notes she tolerates Abraxane well. She notes she had buzzing on her lip after infusion and took benadryl which helped. The day after infusion she had red face and flushing. After benadryl this resolved as well and did not recur. She notes no nausea but had mild constipation. She still has fatigue. She notes her sleep apnea occurrences have increased and struggles to reduce it. She  also notes bone and muscle aches in her upper trunk in the past week and intermittent stabbing pain in a few spots of nerves, and painful gas. She notes she has lost most of her hair.  She notes her left hand skin biopsy of rash from dermatologist showed reaction of thiazide and docetaxel. She notes she has not take her HCTZ in a few days because of this. Her BP is elevated today. Given her docetaxel was switched and the main cause of her rash, she can restart her HCTZ. She is still waiting on our pharmacist seeing if her supplements interact with her treatment.    REVIEW OF SYSTEMS:   Constitutional: Denies fevers, chills or abnormal weight loss Eyes: Denies blurriness of vision Ears, nose, mouth, throat, and face: Denies mucositis or sore throat Respiratory: Denies cough, dyspnea or wheezes Cardiovascular: Denies palpitation, chest discomfort or lower extremity swelling Gastrointestinal:  Denies nausea, heartburn or change in bowel habits (+) Gas Skin: Denies abnormal skin rashes (+) Hair loss  MSK: (+) Intermittent upper trunk muscle pain  Lymphatics: Denies  new lymphadenopathy or easy bruising Neurological:Denies numbness, tingling or new weaknesses (+) Intermittent nerve pain  Behavioral/Psych: Mood is stable, no new changes  All other systems were reviewed with the patient and are negative.  MEDICAL HISTORY:  Past Medical History:  Diagnosis Date  . Anal fissure   . Atrial septal aneurysm / if pfo  echo 6 13  10/23/2011  . Breast cancer ([place])   . Cataract    both eyes  . Depression   . Fatty liver    "pre fatty liver"  . Fibromyalgia   . Headache(784.0)    hx of migraines when younger  . Heart palpitations    hx with normal holter event monitoring  . Hx: UTI (urinary tract infection)   . Hyperlipidemia   . Hypertension   . Hypothyroidism   . Obesity   . Pneumonia 1972   hx of  . Polyp of colon   . 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 Skin cancer    basal, squamous cell   . Sleep apnea    uses cpap    SURGICAL HISTORY: Past Surgical History:  Procedure Laterality Date  . BREAST LUMPECTOMY WITH RADIOACTIVE SEED AND SENTINEL LYMPH NODE BIOPSY Right 07/26/2020   Procedure: RIGHT BREAST LUMPECTOMY WITH RADIOACTIVE SEED AND SENTINEL LYMPH NODE BIOPSY;  Surgeon: Jovita Kussmaul, MD;  Location: [facility];  Service: General;  Laterality: Right;  . BREAST LUMPECTOMY WITH RADIOACTIVE SEED LOCALIZATION Left 07/26/2020   Procedure: LEFT BREAST LUMPECTOMY X 2  WITH RADIOACTIVE SEED LOCALIZATION;  Surgeon: Jovita Kussmaul, MD;  Location: [facility];  Service: General;  Laterality: Left;  . COLONOSCOPY    . DILATION AND CURETTAGE OF UTERUS  1978  . [place]  . KNEE ARTHROSCOPY     both in past  . POLYPECTOMY    . SHOULDER OPEN ROTATOR CUFF REPAIR Left 07/07/2013   Procedure: LEFT SHOULDER MINI OPEN SUBACROMIAL DECOMPRESSION ROTATOR CUFF REPAIR AND POSSIBLE PATCH GRAFT ;  Surgeon: Johnn Hai, MD;  Location: [facility];  Service: Orthopedics;  Laterality: Left;  with interscaline block  . SKIN CANCER EXCISION Bilateral    arm, legs, and chest  . TONSILLECTOMY      I have reviewed the social history and family history with the patient and they are unchanged from previous note.  ALLERGIES:  is allergic to penicillins, taxotere [docetaxel], cefdinir, norvasc [amlodipine besylate], cetirizine, irbesartan, molds & smuts, pegfilgrastim, and tylenol [acetaminophen].  MEDICATIONS:  Current Outpatient Medications  Medication Sig Dispense Refill  . Ascorbic Acid (VITAMIN C) 1000 MG tablet Take 1,000 mg by mouth daily.    Marland Kitchen b complex vitamins capsule Take 1 capsule by mouth daily.    . Bioflavonoid Products (QUERCETIN COMPLEX IMMUNE PO) Take 1 capsule by mouth daily.    . Cholecalciferol (VITAMIN D) 50 MCG (2000 UT) tablet Take 4,000 Units by mouth daily.    Marland Kitchen dexamethasone (DECADRON) 4 MG tablet Take 2 tablets (8 mg total) by mouth 2 (two) times daily. Start the  day before Taxotere. Then again the day after chemo for 3 days. 30 tablet 1  . hydrochlorothiazide (HYDRODIURIL) 25 MG tablet TAKE 1 TABLET BY MOUTH EVERY DAY 90 tablet 0  . hydroxychloroquine (PLAQUENIL) 200 MG tablet Take 200 mg by mouth 2 (two) times a week.    . lidocaine-prilocaine (EMLA) cream Apply to affected area once 30 g 3  . LORazepam (ATIVAN) 1 MG tablet Take 0.5-1 tablets (0.5-1 mg total)  by mouth every 8 (eight) hours as needed for anxiety. anxiety 20 tablet 0  . MAGNESIUM CARBONATE PO Take 1 tablet by mouth daily.    . melatonin 5 MG TABS Take 5 mg by mouth at bedtime.    . Menaquinone-7 (VITAMIN K2) 100 MCG CAPS Take 100 mcg by mouth daily.    . metoprolol succinate (TOPROL XL) 25 MG 24 hr tablet TAKE ONCE A DAY AS DIRECTED (Patient taking differently: Take by mouth daily as needed (Blood pressure). TAKE ONCE A DAY AS DIRECTED) 30 tablet 1  . Multiple Vitamin (MULTIVITAMIN) tablet Take 1 tablet by mouth daily.    . NON FORMULARY Take 48.6 mg by mouth See admin instructions. Thyroid desiccated (procine) sr 2 capsules 5 days a week, 3 capsules 2 days per week (Tues and Fri)    . OLIVE LEAF EXTRACT PO Take 1 capsule by mouth daily.    . ondansetron (ZOFRAN) 8 MG tablet Take 1 tablet (8 mg total) by mouth 2 (two) times daily as needed for refractory nausea / vomiting. Start on day 3 after chemo. 30 tablet 1  . OVER THE COUNTER MEDICATION Take 2 capsules by mouth in the morning and at bedtime. Ifnla Med    . OVER THE COUNTER MEDICATION Take 1 capsule by mouth daily. Essential Pro    . potassium chloride SA (KLOR-CON) 20 MEQ tablet TAKE ONE TABLET BY MOUTH ONE TIME DAILY 90 tablet 0  . Probiotic Product (PROBIOTIC PO) Take 1 capsule by mouth daily.    . prochlorperazine (COMPAZINE) 10 MG tablet Take 1 tablet (10 mg total) by mouth every 6 (six) hours as needed (Nausea or vomiting). 30 tablet 1  . traMADol (ULTRAM) 50 MG tablet Take 1-2 tablets (50-100 mg total) by mouth every 6 (six)  hours as needed. 20 tablet 1  . UBIQUINOL PO Take by mouth.    Marland Kitchen VITAMIN E PO Take 23 mg by mouth daily.    Marland Kitchen ZINC GLUCONATE PO Take 10 mg by mouth daily.     No current facility-administered medications for this visit.    PHYSICAL EXAMINATION: ECOG PERFORMANCE STATUS: 1 - Symptomatic but completely ambulatory  Vitals:   10/12/20 1003  BP: (!) 162/81  Pulse: 80  Resp: 16  Temp: 99.3 F (37.4 C)  SpO2: 100%   Filed Weights   10/12/20 1003  Weight: 207 lb 1.6 oz (93.9 kg)    GENERAL:alert, no distress and comfortable SKIN: skin color, texture, turgor are normal, no rashes or significant lesions EYES: normal, Conjunctiva are pink and non-injected, sclera clear  NECK: supple, thyroid normal size, non-tender, without nodularity LYMPH:  no palpable lymphadenopathy in the cervical, axillary  LUNGS: clear to auscultation and percussion with normal breathing effort HEART: regular rate & rhythm and no murmurs and no lower extremity edema ABDOMEN:abdomen soft, non-tender and normal bowel sounds (+) Mild epigastric tenderness Musculoskeletal:no cyanosis of digits and no clubbing  NEURO: alert & oriented x 3 with fluent speech, no focal motor/sensory deficits BREAST: S/p B/l lumpectomy: Surgical incision healed well with palpable seroma in right axillary incision. Breast exam benign.   LABORATORY DATA:  I have reviewed the data as listed CBC Latest Ref Rng & Units 10/12/2020 09/25/2020 09/21/2020  WBC 4.0 - 10.5 K/uL 6.2 5.8 7.5  Hemoglobin 12.0 - 15.0 g/dL 12.5 12.9 12.2  Hematocrit 36.0 - 46.0 % 37.7 38.0 37.1  Platelets 150 - 400 K/uL 184 212 285     CMP Latest Ref Rng &  Units 10/12/2020 09/25/2020 09/21/2020  Glucose 70 - 99 mg/dL 95 103(H) 91  BUN 8 - 23 mg/dL _0 Creatinine 0.44 - 1.00 mg/dL 0.76 0.89 0.74  Sodium 135 - 145 mmol/L 140 138 138  Potassium 3.5 - 5.1 mmol/L 4.3 3.8 4.1  Chloride 98 - 111 mmol/L 105 99 103  CO2 22 - 32 mmol/L _1 Calcium 8.9 - 10.3  mg/dL 9.5 9.9 9.3  Total Protein 6.5 - 8.1 g/dL 6.8 7.0 6.8  Total Bilirubin 0.3 - 1.2 mg/dL 0.5 0.9 0.7  Alkaline Phos 38 - 126 U/L 170(H) 112 130(H)  AST 15 - 41 U/L 43(H) 32 32  ALT 0 - 44 U/L 97(H) 54(H) 56(H)      RADIOGRAPHIC STUDIES: I have personally reviewed the radiological images as listed and agreed with the findings in the report. No results found.   ASSESSMENT & PLAN:  [name] is a 65 y.o. female with    1.Malignant neoplasm of upper-outer quadrant of right breast, StageIB,p(T1cN1aM0), ER+/PR+/HER2-, GradeIIANDLeftbreast DCIS, gradeII, ER+/PR+(removed by biopsy) -She was diagnosed in 06/2020.She was found to have b/l masses on mammogram. On biopsy she was seen to have2 small area ofDCIS inleft breast and 0.8cm right breast invasive ductal carcinoma. -She underwent B/l lumpectomy and right SNLB by Dr Marlou Starks on 07/26/20. Her surgical path showed her left breast DCIS was completely removed with biopsy and no residual DCIS on path. Her grade II, 1.5cm invasive ductal carcinoma of right breast was completely removed with clear margins and 1/2 positive LNs. -Given positive LN, Mammaprint was done and results show high risk of Luminal Type B with 29% risk ofdistantrecurrence.She has 94.6%chance of 5-year recurrence free withchemotherapy andhormonaltherapy. -Staging CT C/A/P on 08/20/20 and bone scan on 08/28/20 were negative for metastatic disease. -Given her high risk of cancer I started her on adjuvant chemotherapy with moderately intensive Docetaxel and Cytoxan q3weeks for 4 cycles on 08/30/20. Given skin rash, I changed her Docetaxel to Abraxane starting with C2 on 09/21/20. Given poor tolerance, will only give filgrastim as needed. -If she is not able to tolerate this regimen, will change to less intensive weekly chemo for the rest of treatment.  -S/p C2 she notes after infusion and the following day she had lip tingling and then face flushing and  redness that resolved with benadryl. She is not sure if this was a true reaction. She also notes intermittent nerve and muscle pain which was manageable.  -Labs reviewed, and adequate to proceed with C3 Abraxane and Cytoxan today with dose increase of abraxane to 255m/m2. I recommend she take Benadryl after infusion today and 2-3 tabs daily on day 2 and 3.  -She still has seroma in right axillary incision. If grows or becomes painful, she can follow up with surgeon for aspiration. I recommend she watch for infection as well.  -F/u 3 weeks before last cycle chemo   2. Comorbidities:Depression, Fibromyalgia, HLD, HTN, Hypothyroidism, H/o Skin cancer(squamous and basal cell), sleep apnea  -She notes having Adrenal fatigue. I discussed based on her labs, this is not consistent with adrenal insufficiency. I recommend she see endocrinologist -She is followed for HTN. She takes her BP at home. The bottom number is always low, and the top number goes to normal when she lays down. -On medications, continue to f/u with her other physicians.   3. Genetic testing, she will think about it.  4.Fatty liver and transaminitis -She has chronic intermittent transaminitis, which she  contributes to fatty liver.She is on low sugar diet and drinks a lot of water -Liver morphology was unremarkable on recent CT scan -Labs reviewed-- AST 78, ALT 101 on 08/30/20.  -She does not drink alcohol, she knows to avoid Tylenol. -Her LFTs fluctuate. AST 43, ALT 97, Alk Phos 170 today (10/12/20).  OK to proceed with chemo   5. Constipation  -She notes she has been using fiber rich supplements and Magnesium. I discussed OTC options if needed.    PLAN: -Labs reviewed and adequate to proceed with C3 Abraxane and Cytoxan today with dose increase of Abraxane to 200 mg/m, no pegfilgrastim  -Take Benadryl after infusion today and 2-3 tabs daily on day 2 and 3 to prevent allergy reaction.  -Lab, f/u and Abraxane and  Cytoxan in 3 weeks. -We asked our pharmacist to check the interaction between her supplements and chemo, it turns out multiple interactions, I recommend her to hold supplements for now.   No problem-specific Assessment & Plan notes found for this encounter.   No orders of the defined types were placed in this encounter.  All questions were answered. The patient knows to call the clinic with any problems, questions or concerns. No barriers to learning was detected. The total time spent in the appointment was 30 minutes.     Truitt Merle, MD 10/12/2020   I, Joslyn Devon, am acting as scribe for Truitt Merle, MD.   I have reviewed the above documentation for accuracy and completeness, and I agree with the above.

## 2020-10-11 ENCOUNTER — Encounter: Payer: Self-pay | Admitting: *Deleted

## 2020-10-11 DIAGNOSIS — Z170 Estrogen receptor positive status [ER+]: Secondary | ICD-10-CM

## 2020-10-11 DIAGNOSIS — C50411 Malignant neoplasm of upper-outer quadrant of right female breast: Secondary | ICD-10-CM

## 2020-10-12 ENCOUNTER — Encounter: Payer: Self-pay | Admitting: Hematology

## 2020-10-12 ENCOUNTER — Inpatient Hospital Stay: Payer: Medicare HMO

## 2020-10-12 ENCOUNTER — Other Ambulatory Visit: Payer: Self-pay

## 2020-10-12 ENCOUNTER — Inpatient Hospital Stay: Payer: Medicare HMO | Attending: Hematology | Admitting: Hematology

## 2020-10-12 VITALS — BP 162/81 | HR 80 | Temp 99.3°F | Resp 16 | Ht 66.0 in | Wt 207.1 lb

## 2020-10-12 DIAGNOSIS — C50411 Malignant neoplasm of upper-outer quadrant of right female breast: Secondary | ICD-10-CM

## 2020-10-12 DIAGNOSIS — E039 Hypothyroidism, unspecified: Secondary | ICD-10-CM | POA: Insufficient documentation

## 2020-10-12 DIAGNOSIS — K5900 Constipation, unspecified: Secondary | ICD-10-CM | POA: Insufficient documentation

## 2020-10-12 DIAGNOSIS — G4730 Sleep apnea, unspecified: Secondary | ICD-10-CM | POA: Insufficient documentation

## 2020-10-12 DIAGNOSIS — E669 Obesity, unspecified: Secondary | ICD-10-CM | POA: Insufficient documentation

## 2020-10-12 DIAGNOSIS — E785 Hyperlipidemia, unspecified: Secondary | ICD-10-CM | POA: Insufficient documentation

## 2020-10-12 DIAGNOSIS — D0512 Intraductal carcinoma in situ of left breast: Secondary | ICD-10-CM

## 2020-10-12 DIAGNOSIS — Z170 Estrogen receptor positive status [ER+]: Secondary | ICD-10-CM

## 2020-10-12 DIAGNOSIS — Z86010 Personal history of colonic polyps: Secondary | ICD-10-CM | POA: Insufficient documentation

## 2020-10-12 DIAGNOSIS — Z5111 Encounter for antineoplastic chemotherapy: Secondary | ICD-10-CM | POA: Insufficient documentation

## 2020-10-12 DIAGNOSIS — F32A Depression, unspecified: Secondary | ICD-10-CM | POA: Diagnosis not present

## 2020-10-12 DIAGNOSIS — I10 Essential (primary) hypertension: Secondary | ICD-10-CM | POA: Insufficient documentation

## 2020-10-12 DIAGNOSIS — M797 Fibromyalgia: Secondary | ICD-10-CM | POA: Diagnosis not present

## 2020-10-12 DIAGNOSIS — K760 Fatty (change of) liver, not elsewhere classified: Secondary | ICD-10-CM | POA: Insufficient documentation

## 2020-10-12 DIAGNOSIS — Z79899 Other long term (current) drug therapy: Secondary | ICD-10-CM | POA: Diagnosis not present

## 2020-10-12 DIAGNOSIS — Z7952 Long term (current) use of systemic steroids: Secondary | ICD-10-CM | POA: Insufficient documentation

## 2020-10-12 LAB — CBC WITH DIFFERENTIAL (CANCER CENTER ONLY)
Abs Immature Granulocytes: 0.01 10*3/uL (ref 0.00–0.07)
Basophils Absolute: 0.1 10*3/uL (ref 0.0–0.1)
Basophils Relative: 1 %
Eosinophils Absolute: 0.2 10*3/uL (ref 0.0–0.5)
Eosinophils Relative: 3 %
HCT: 37.7 % (ref 36.0–46.0)
Hemoglobin: 12.5 g/dL (ref 12.0–15.0)
Immature Granulocytes: 0 %
Lymphocytes Relative: 35 %
Lymphs Abs: 2.2 10*3/uL (ref 0.7–4.0)
MCH: 30.3 pg (ref 26.0–34.0)
MCHC: 33.2 g/dL (ref 30.0–36.0)
MCV: 91.5 fL (ref 80.0–100.0)
Monocytes Absolute: 0.5 10*3/uL (ref 0.1–1.0)
Monocytes Relative: 8 %
Neutro Abs: 3.3 10*3/uL (ref 1.7–7.7)
Neutrophils Relative %: 53 %
Platelet Count: 184 10*3/uL (ref 150–400)
RBC: 4.12 MIL/uL (ref 3.87–5.11)
RDW: 14.3 % (ref 11.5–15.5)
WBC Count: 6.2 10*3/uL (ref 4.0–10.5)
nRBC: 0 % (ref 0.0–0.2)

## 2020-10-12 LAB — CMP (CANCER CENTER ONLY)
ALT: 97 U/L — ABNORMAL HIGH (ref 0–44)
AST: 43 U/L — ABNORMAL HIGH (ref 15–41)
Albumin: 3.9 g/dL (ref 3.5–5.0)
Alkaline Phosphatase: 170 U/L — ABNORMAL HIGH (ref 38–126)
Anion gap: 9 (ref 5–15)
BUN: 18 mg/dL (ref 8–23)
CO2: 26 mmol/L (ref 22–32)
Calcium: 9.5 mg/dL (ref 8.9–10.3)
Chloride: 105 mmol/L (ref 98–111)
Creatinine: 0.76 mg/dL (ref 0.44–1.00)
GFR, Estimated: >60 mL/min (ref >60)
Glucose, Bld: 95 mg/dL (ref 70–99)
Potassium: 4.3 mmol/L (ref 3.5–5.1)
Sodium: 140 mmol/L (ref 135–145)
Total Bilirubin: 0.5 mg/dL (ref 0.3–1.2)
Total Protein: 6.8 g/dL (ref 6.5–8.1)

## 2020-10-12 MED ORDER — PACLITAXEL PROTEIN-BOUND CHEMO INJECTION 100 MG
190.0000 mg/m2 | Freq: Once | INTRAVENOUS | Status: AC
  Administered 2020-10-12: 400 mg via INTRAVENOUS
  Filled 2020-10-12: qty 80

## 2020-10-12 MED ORDER — PALONOSETRON HCL INJECTION 0.25 MG/5ML
INTRAVENOUS | Status: AC
  Filled 2020-10-12: qty 5

## 2020-10-12 MED ORDER — SODIUM CHLORIDE 0.9 % IV SOLN
500.0000 mg/m2 | Freq: Once | INTRAVENOUS | Status: AC
  Administered 2020-10-12: 1040 mg via INTRAVENOUS
  Filled 2020-10-12: qty 52

## 2020-10-12 MED ORDER — SODIUM CHLORIDE 0.9 % IV SOLN
Freq: Once | INTRAVENOUS | Status: AC
  Filled 2020-10-12: qty 250

## 2020-10-12 MED ORDER — DIPHENHYDRAMINE HCL 25 MG PO CAPS
50.0000 mg | ORAL_CAPSULE | Freq: Once | ORAL | Status: AC
Start: 2020-10-12 — End: 2020-10-12
  Administered 2020-10-12: 50 mg via ORAL

## 2020-10-12 MED ORDER — PALONOSETRON HCL INJECTION 0.25 MG/5ML
0.2500 mg | Freq: Once | INTRAVENOUS | Status: AC
  Administered 2020-10-12: 0.25 mg via INTRAVENOUS

## 2020-10-12 MED ORDER — SODIUM CHLORIDE 0.9 % IV SOLN
10.0000 mg | Freq: Once | INTRAVENOUS | Status: AC
  Administered 2020-10-12: 10 mg via INTRAVENOUS
  Filled 2020-10-12: qty 10

## 2020-10-12 MED ORDER — DIPHENHYDRAMINE HCL 25 MG PO CAPS
ORAL_CAPSULE | ORAL | Status: AC
  Filled 2020-10-12: qty 2

## 2020-10-12 NOTE — Patient Instructions (Signed)
[facility]  Discharge Instructions: Thank you for choosing [facility] to provide your oncology and hematology care.   If you have a lab appointment with the [facility], please go directly to the [facility] and check in at the registration area.   Wear comfortable clothing and clothing appropriate for easy access to any Portacath or PICC line.   We strive to give you quality time with your provider. You may need to reschedule your appointment if you arrive late (15 or more minutes).  Arriving late affects you and other patients whose appointments are after yours.  Also, if you miss three or more appointments without notifying the office, you may be dismissed from the clinic at the provider's discretion.      For prescription refill requests, have your pharmacy contact our office and allow 72 hours for refills to be completed.    Today you received the following chemotherapy and/or immunotherapy agents: paclitaxel-protein bound and cyclophosphamide.      To help prevent nausea and vomiting after your treatment, we encourage you to take your nausea medication as directed.  BELOW ARE SYMPTOMS THAT SHOULD BE REPORTED IMMEDIATELY: . *FEVER GREATER THAN 100.4 F (38 C) OR HIGHER . *CHILLS OR SWEATING . *NAUSEA AND VOMITING THAT IS NOT CONTROLLED WITH YOUR NAUSEA MEDICATION . *UNUSUAL SHORTNESS OF BREATH . *UNUSUAL BRUISING OR BLEEDING . *URINARY PROBLEMS (pain or burning when urinating, or frequent urination) . *BOWEL PROBLEMS (unusual diarrhea, constipation, pain near the anus) . TENDERNESS IN MOUTH AND THROAT WITH OR WITHOUT PRESENCE OF ULCERS (sore throat, sores in mouth, or a toothache) . UNUSUAL RASH, SWELLING OR PAIN  . UNUSUAL VAGINAL DISCHARGE OR ITCHING   Items with * indicate a potential emergency and should be followed up as soon as possible or go to the Emergency Department if any problems should occur.  Please show the  CHEMOTHERAPY ALERT CARD or IMMUNOTHERAPY ALERT CARD at check-in to the Emergency Department and triage nurse.  Should you have questions after your visit or need to cancel or reschedule your appointment, please contact [facility]  Dept: [phone]  and follow the prompts.  Office hours are 8:00 a.m. to 4:30 p.m. Monday - Friday. Please note that voicemails left after 4:00 p.m. may not be returned until the following business day.  We are closed weekends and major holidays. You have access to a nurse at all times for urgent questions. Please call the main number to the clinic Dept: [phone] and follow the prompts.   For any non-urgent questions, you may also contact your provider using MyChart. We now offer e-Visits for anyone 67 and older to request care online for non-urgent symptoms. For details visit [email or web address].   Also download the MyChart app! Go to the app store, search "MyChart", open the app, select [facility], and log in with your MyChart username and password.  Due to Covid, a mask is required upon entering the hospital/clinic. If you do not have a mask, one will be given to you upon arrival. For doctor visits, patients may have 1 support person aged 67 or older with them. For treatment visits, patients cannot have anyone with them due to current Covid guidelines and our immunocompromised population.   N

## 2020-10-12 NOTE — Progress Notes (Signed)
Okay to treat with ALT of 97 for today's D1C3 Abraxane/Cytoxan.

## 2020-10-15 ENCOUNTER — Ambulatory Visit: Payer: Medicare HMO

## 2020-10-15 DIAGNOSIS — R5381 Other malaise: Secondary | ICD-10-CM | POA: Diagnosis not present

## 2020-10-15 DIAGNOSIS — C50919 Malignant neoplasm of unspecified site of unspecified female breast: Secondary | ICD-10-CM | POA: Diagnosis not present

## 2020-10-16 DIAGNOSIS — D321 Benign neoplasm of spinal meninges: Secondary | ICD-10-CM | POA: Diagnosis not present

## 2020-10-17 DIAGNOSIS — R69 Illness, unspecified: Secondary | ICD-10-CM | POA: Diagnosis not present

## 2020-10-18 ENCOUNTER — Telehealth: Payer: Self-pay

## 2020-10-18 DIAGNOSIS — M531 Cervicobrachial syndrome: Secondary | ICD-10-CM | POA: Diagnosis not present

## 2020-10-18 DIAGNOSIS — M9903 Segmental and somatic dysfunction of lumbar region: Secondary | ICD-10-CM | POA: Diagnosis not present

## 2020-10-18 DIAGNOSIS — M9902 Segmental and somatic dysfunction of thoracic region: Secondary | ICD-10-CM | POA: Diagnosis not present

## 2020-10-18 DIAGNOSIS — M9901 Segmental and somatic dysfunction of cervical region: Secondary | ICD-10-CM | POA: Diagnosis not present

## 2020-10-18 NOTE — Telephone Encounter (Signed)
Micala called wanting to know how soon after her last chemotherapy treatment she can go to the [facility].

## 2020-10-22 ENCOUNTER — Other Ambulatory Visit: Payer: Self-pay

## 2020-10-22 ENCOUNTER — Encounter: Payer: Self-pay | Admitting: Physical Therapy

## 2020-10-22 ENCOUNTER — Ambulatory Visit: Payer: Medicare HMO | Attending: General Surgery | Admitting: Physical Therapy

## 2020-10-22 DIAGNOSIS — Z483 Aftercare following surgery for neoplasm: Secondary | ICD-10-CM

## 2020-10-22 NOTE — Therapy (Signed)
[place], [place], [address] Phone: [phone]   Fax:  [phone]  Physical Therapy Treatment  Patient Details  Name: [name] MRN: [id number] Date of Birth: Apr 02, 1956 Referring Provider (PT): Dr. Autumn Messing   Encounter Date: 10/22/2020   PT End of Session - 10/22/20 0923     Visit Number 2   unchanged due to SOZO screen   PT Start Time 0932    PT Stop Time 0941    PT Time Calculation (min) 9 min             Past Medical History:  Diagnosis Date   Anal fissure    Atrial septal aneurysm / if pfo  echo 6 13  10/23/2011   Breast cancer ([facility])    Cataract    both eyes   Depression    Fatty liver    "pre fatty liver"   Fibromyalgia    Headache(784.0)    hx of migraines when younger   Heart palpitations    hx with normal holter event monitoring   Hx: UTI (urinary tract infection)    Hyperlipidemia    Hypertension    Hypothyroidism    Obesity    Pneumonia 1972   hx of   Polyp of colon    Serrated adenoma of colon 08/2012   Skin cancer    basal, squamous cell   Sleep apnea    uses cpap    Past Surgical History:  Procedure Laterality Date   BREAST LUMPECTOMY WITH RADIOACTIVE SEED AND SENTINEL LYMPH NODE BIOPSY Right 07/26/2020   Procedure: RIGHT BREAST LUMPECTOMY WITH RADIOACTIVE SEED AND SENTINEL LYMPH NODE BIOPSY;  Surgeon: Jovita Kussmaul, MD;  Location: [facility];  Service: General;  Laterality: Right;   BREAST LUMPECTOMY WITH RADIOACTIVE SEED LOCALIZATION Left 07/26/2020   Procedure: LEFT BREAST LUMPECTOMY X 2  WITH RADIOACTIVE SEED LOCALIZATION;  Surgeon: Jovita Kussmaul, MD;  Location: [facility];  Service: General;  Laterality: Left;   COLONOSCOPY     DILATION AND CURETTAGE OF [place] ARTHROSCOPY     both in past   POLYPECTOMY     [place] Left 07/07/2013   Procedure: LEFT SHOULDER MINI OPEN SUBACROMIAL  DECOMPRESSION ROTATOR CUFF REPAIR AND POSSIBLE PATCH GRAFT ;  Surgeon: Johnn Hai, MD;  Location: [facility];  Service: Orthopedics;  Laterality: Left;  with interscaline block   SKIN CANCER EXCISION Bilateral    arm, legs, and chest   TONSILLECTOMY      There were no vitals filed for this visit.   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 She underwent a left lumpectomy for DCIS and a right lumpectomy and sentinel node biopsy on 07/26/2020. She had 2 lymph nodes removed with 1 being positive for cancer on the right side. It is ER/PR positive. Left shoulder rotator cuff repair 06/2013.                    L-DEX FLOWSHEETS - 10/22/20 0922       L-DEX LYMPHEDEMA SCREENING   Measurement Type Unilateral   DCIS on left with no nodes removed so only Rt at risk   L-DEX MEASUREMENT EXTREMITY Upper Extremity  POSITION  Standing    DOMINANT SIDE Right    At Risk Side Right    BASELINE SCORE (UNILATERAL) 3.5    L-DEX SCORE (UNILATERAL) 1.9                                    PT Long Term Goals - 08/09/20 1346       PT LONG TERM GOAL #1   Title Patient will demonstrate she has regained full shoulder ROM and function post operatively compared with baselines.    Time 8    Period Weeks    Status Partially Met                   Plan - 10/22/20 0921     Clinical Impression Statement Pt is here for 3 month SOZO screen . She measures 1.9 which is in the acceptable range from baseline. No sleeve is recommended at this time.    PT Next Visit Plan Continue with 3 month SOZO screens    Consulted and Agree with Plan of Care Patient             Patient will benefit from skilled therapeutic intervention in order to improve the following deficits and impairments:     Visit  Diagnosis: Aftercare following surgery for neoplasm     Problem List Patient Active Problem List   Diagnosis Date Noted   Malignant neoplasm of upper-outer quadrant of right breast in female, estrogen receptor positive ([facility]) 06/25/2020   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   Hepatic steatosis 11/27/2014   Hx of adenomatous colonic polyps 11/27/2014   Visit for preventive health examination 03/28/2014   Hyperlipidemia 03/28/2014   Left rotator cuff tear 07/07/2013   Tear of left rotator cuff 07/07/2013   Rhinitis 06/01/2013   Chest wall deformity 04/12/2012   Atrial septal aneurysm / if pfo  echo 6 13  10/23/2011   OSA (obstructive sleep apnea) 11/27/2010   Dyslipidemia 11/01/2010   Flushing 08/29/2010   Labile hypertension 08/29/2010   MUSCLE CRAMPS, FOOT 06/10/2010   OTHER SLEEP DISTURBANCES 06/10/2010   ANXIETY, SITUATIONAL 05/08/2010   VERTIGO, POSITIONAL 03/15/2010   VITAMIN D DEFICIENCY 02/18/2010   OBESITY 02/18/2010   ALLERGIC RHINITIS 02/18/2010   PLANTAR FASCIITIS 02/18/2010   TWITCHING 06/11/2009   NUMBNESS, HAND 06/11/2009   CERVICAL STRAIN, ACUTE 06/11/2009   OTHER MALAISE AND FATIGUE 07/09/2007   HYPERTENSION 01/28/2007   Hypothyroidism 11/11/2006   COMMON MIGRAINE 11/11/2006   GERD 11/11/2006   FIBROMYALGIA 11/11/2006   Helene Kelp K. Owens Shark PT  [facility]o 10/22/2020, 9:42 AM  [facility] [place], [place], [address] Phone: [phone]   Fax:  [phone]  Name: VIKI CARRERA MRN: [id number] Date of Birth: 22-May-1955

## 2020-10-23 DIAGNOSIS — Z9989 Dependence on other enabling machines and devices: Secondary | ICD-10-CM | POA: Diagnosis not present

## 2020-10-23 DIAGNOSIS — Z72821 Inadequate sleep hygiene: Secondary | ICD-10-CM | POA: Diagnosis not present

## 2020-10-23 DIAGNOSIS — R0981 Nasal congestion: Secondary | ICD-10-CM | POA: Diagnosis not present

## 2020-10-23 DIAGNOSIS — G4733 Obstructive sleep apnea (adult) (pediatric): Secondary | ICD-10-CM | POA: Diagnosis not present

## 2020-10-24 ENCOUNTER — Other Ambulatory Visit: Payer: Self-pay | Admitting: Internal Medicine

## 2020-10-25 ENCOUNTER — Encounter: Payer: Self-pay | Admitting: *Deleted

## 2020-10-26 ENCOUNTER — Encounter: Payer: Self-pay | Admitting: Hematology

## 2020-10-29 NOTE — Progress Notes (Signed)
[facility]   [phone] [phone]   Clinic Follow up Note   Patient Care Team: Panosh, Standley Brooking, MD as PCP - General Arvella Nigh, MD as Consulting Physician (Obstetrics and Gynecology) Dr Peggye Form, MD as Consulting Physician (Orthopedic Surgery) Ladene Artist, MD as Consulting Physician (Gastroenterology) Melina Schools, MD as Consulting Physician (Orthopedic Surgery) Rockwell Germany, RN as Oncology Nurse Navigator Mauro Kaufmann, RN as Oncology Nurse Navigator Jovita Kussmaul, MD as Consulting Physician (General Surgery) Truitt Merle, MD as Consulting Physician (Hematology) Eppie Gibson, MD as Attending Physician (Radiation Oncology) Jari Pigg, MD as Consulting Physician (Dermatology) Serita Kyle, MD as Referring Physician (Cardiology)  Date of Service:  11/02/2020  CHIEF COMPLAINT:  f/u of Left breast DCIS and Right breast cancer   SUMMARY OF ONCOLOGIC HISTORY: Oncology History Overview Note  Cancer Staging Ductal carcinoma in situ (DCIS) of left breast Staging form: Breast, AJCC 8th Edition - Clinical stage from 06/21/2020: Stage 0 (cTis (DCIS), cN0, cM0, G2, ER+, PR+, HER2: Not Assessed) - Signed by Truitt Merle, MD on 06/26/2020 Stage prefix: Initial diagnosis  Malignant neoplasm of upper-outer quadrant of right breast in female, estrogen receptor positive ([place]) Staging form: Breast, AJCC 8th Edition - Clinical stage from 06/21/2020: Stage IA (cT1b, cN0, cM0, G2, ER+, PR+, HER2-) - Signed by Truitt Merle, MD on 06/26/2020 Stage prefix: Initial diagnosis - Pathologic stage from 07/26/2020: Stage IA (pT1c, pN1a, cM0, G2, ER+, PR+, HER2-) - Signed by Truitt Merle, MD on 08/09/2020 Stage prefix: Initial diagnosis Nuclear grade: G2 Histologic grading system: 3 grade system Residual tumor (R): R0 - None    Malignant neoplasm of upper-outer quadrant of right breast in female, estrogen receptor positive ([place])  06/08/2020 Mammogram    IMPRESSION: 1. 9 x 7 x 6 mm mass in the 12 o'clock position of the right breast, 2cmfn with imaging features highly suspicious for malignancy. 2. 4 mm group of indeterminate calcifications in the 12 o'clock position of the left breast and 4 mm group of indeterminate calcifications in the 1 o'clock position of the left breast. Together, the groups span an area measuring 3.9 cm.   06/21/2020 Cancer Staging   Staging form: Breast, AJCC 8th Edition - Clinical stage from 06/21/2020: Stage IA (cT1b, cN0, cM0, G2, ER+, PR+, HER2-) - Signed by Truitt Merle, MD on 06/26/2020  Stage prefix: Initial diagnosis    06/21/2020 Initial Biopsy   Diagnosis 1. Breast, right, needle core biopsy, 12 oc - INVASIVE MAMMARY CARCINOMA - MAMMARY CARCINOMA IN SITU - SEE COMMENT 2. Breast, left, needle core biopsy, 12 oc - MAMMARY CARCINOMA IN-SITU WITH NECROSIS AND CALCIFICATIONS - SEE COMMENT 3. Breast, left, needle core biopsy, 1 oc - MAMMARY CARCINOMA IN-SITU WITH NECROSIS AND CALCIFICATIONS - SEE COMMENT Microscopic Comment 1. The biopsy material shows an infiltrative proliferation of cells with large vesicular nuclei with inconspicuous nucleoli, arranged linearly and in small clusters. Based on the biopsy, the carcinoma appears Nottingham grade 2 of 3 and measures 0.8 cm in greatest linear extent. E-cadherin and prognostic markers (ER/PR/ki-67/HER2)are pending and will be reported in an addendum. Dr. Jeannie Done reviewed the case and agrees with the above diagnosis. These results were called to [facility] on June 22, 2020. 2. and 3. E-cadherin is pending and will be reported in an addendum.     1. E-cadherin is POSITIVE supporting a ductal origin. 2. E-cadherin is POSITIVE supporting a ductal origin. A prognostic panel (ER/PR)  is pending and will be reported in an addendum 3. E-cadherin is positive supporting a ductal origin. The focus is less pronounced on the deeper sections and in  isolation would likely be considered atypical ductal hyperplasia.   06/21/2020 Receptors her2   1. PROGNOSTIC INDICATORS Results: IMMUNOHISTOCHEMICAL AND MORPHOMETRIC ANALYSIS PERFORMED MANUALLY The tumor cells are EQUIVOCAL for Her2 (2+). HER2 by FISH will be performed and the results reported separately Estrogen Receptor: 95%, POSITIVE, STRONG STAINING INTENSITY Progesterone Receptor: 40%, POSITIVE, MODERATE STAINING INTENSITY Proliferation Marker Ki67: 10%  1. FLUORESCENCE IN-SITU HYBRIDIZATION Results: GROUP 5: HER2 **NEGATIVE** Equivocal form of amplification of the HER2 gene was detected in the IHC 2+ tissue sample received from this individual. HER2 FISH was performed by a technologist and cell imaging and analysis on the [employer].   06/25/2020 Initial Diagnosis   Malignant neoplasm of upper-outer quadrant of right breast in female, estrogen receptor positive ([place])    07/03/2020 Breast MRI   IMPRESSION: 1. Known RIGHT breast cancer, 12 o'clock axis, at anterior depth, measuring 1 cm greatest extent, manifesting as a spiculated enhancing mass on MRI, with associated biopsy clip. Expected post biopsy changes are seen within the adjacent outer RIGHT breast. 2. No evidence of additional multifocal or multicentric disease within the RIGHT breast. 3. Known LEFT breast DCIS within the slightly outer LEFT breast, at anterior depth, corresponding to the biopsy site labeled 1 o'clock axis, with associated enhancement only at the margins of the biopsy cavity measuring up to 5 mm greatest dimension. 4. Known LEFT breast DCIS within the upper central LEFT breast, at middle depth, corresponding to the biopsy site labeled 12 o'clock axis. Contiguous linear non-mass enhancement extends 2.3 cm superior-medial to the biopsy cavity, most likely post biopsy change but possibly contiguous extent of disease. 5. No evidence of additional multifocal or multicentric disease within the LEFT  breast. 6. No evidence of metastatic lymphadenopathy.     07/26/2020 Cancer Staging   Staging form: Breast, AJCC 8th Edition - Pathologic stage from 07/26/2020: Stage IA (pT1c, pN1a, cM0, G2, ER+, PR+, HER2-) - Signed by Truitt Merle, MD on 08/09/2020  Stage prefix: Initial diagnosis  Nuclear grade: G2  Histologic grading system: 3 grade system  Residual tumor (R): R0 - None    07/26/2020 Surgery   RIGHT BREAST LUMPECTOMY WITH RADIOACTIVE SEED AND SENTINEL LYMPH NODE BIOPSY by Dr Marlou Starks   07/26/2020 Pathology Results   FINAL MICROSCOPIC DIAGNOSIS:   A. LYMPH NODE, RIGHT AXILLARY #1, SENTINEL, EXCISION:  - Lymph node, negative for carcinoma (0/1)   B. LYMPH NODE, RIGHT AXILLARY, SENTINEL, EXCISION:  - Benign fibroadipose tissue, negative for carcinoma   C. BREAST, RIGHT, LUMPECTOMY:  - Invasive ductal carcinoma, 1.5 cm, grade 2  - Ductal carcinoma in situ, low grade  - Resection margins are negative for carcinoma; closest is the anterior  margin of 0.2 cm  - Biopsy site changes  - See oncology table   D. BREAST, LEFT, LUMPECTOMY:  - Benign breast parenchyma with prominent biopsy-related changes  - Negative for residual ductal carcinoma in situ  - See oncology table   E. LYMPH NODE, RIGHT AXILLARY #2, SENTINEL, EXCISION:  - Invasive ductal carcinoma, see comment     COMMENT:   E.  Lymph node tissue is not identified.  Findings likely represent an  entirely replaced lymph node with foci of extranodal extension.    07/26/2020 Miscellaneous   Mammaprint High Risk of Luminla Type B 29% risk of recurrence in 10 years if  untreated.  Her Mammaprint index is -0.175 She has 94.6% benefit of chemotherapy and hormaonal therapy.      08/20/2020 Imaging   CT C/A/P IMPRESSION: 1. No definitive imaging findings to suggest metastatic disease in the chest, abdomen or pelvis. 2. Postoperative changes of bilateral lumpectomy and right axillary lymph node dissection with what appears  to be a large postoperative seroma in the right axilla, as detailed above. Attention on follow-up studies is recommended to ensure the stability or regression of this collection. 3. Additional incidental findings, as above.   08/28/2020 Imaging   Bone Scan IMPRESSION: No definite scintigraphic evidence of osseous metastases.   08/30/2020 - 11/02/2020 Chemotherapy   Adjuvant Docetaxel and Cytoxan q3weeks for 4 cycles starting 08/30/20. Given skin rash, changes taxol to Abraxane starting with C2 (09/21/20). Completed 11/02/20.        Ductal carcinoma in situ (DCIS) of left breast  06/08/2020 Mammogram   IMPRESSION: 1. 9 x 7 x 6 mm mass in the 12 o'clock position of the right breast, 2cmfn with imaging features highly suspicious for malignancy. 2. 4 mm group of indeterminate calcifications in the 12 o'clock position of the left breast and 4 mm group of indeterminate calcifications in the 1 o'clock position of the left breast. Together, the groups span an area measuring 3.9 cm.   06/21/2020 Cancer Staging   Staging form: Breast, AJCC 8th Edition - Clinical stage from 06/21/2020: Stage 0 (cTis (DCIS), cN0, cM0, G2, ER+, PR+, HER2: Not Assessed) - Signed by Truitt Merle, MD on 06/26/2020  Stage prefix: Initial diagnosis    06/21/2020 Initial Biopsy   Diagnosis 1. Breast, right, needle core biopsy, 12 oc - INVASIVE MAMMARY CARCINOMA - MAMMARY CARCINOMA IN SITU - SEE COMMENT 2. Breast, left, needle core biopsy, 12 oc - MAMMARY CARCINOMA IN-SITU WITH NECROSIS AND CALCIFICATIONS - SEE COMMENT 3. Breast, left, needle core biopsy, 1 oc - MAMMARY CARCINOMA IN-SITU WITH NECROSIS AND CALCIFICATIONS - SEE COMMENT Microscopic Comment 1. The biopsy material shows an infiltrative proliferation of cells with large vesicular nuclei with inconspicuous nucleoli, arranged linearly and in small clusters. Based on the biopsy, the carcinoma appears Nottingham grade 2 of 3 and measures 0.8 cm in greatest  linear extent. E-cadherin and prognostic markers (ER/PR/ki-67/HER2)are pending and will be reported in an addendum. Dr. Jeannie Done reviewed the case and agrees with the above diagnosis. These results were called to [facility] on June 22, 2020. 2. and 3. E-cadherin is pending and will be reported in an addendum.     1. E-cadherin is POSITIVE supporting a ductal origin. 2. E-cadherin is POSITIVE supporting a ductal origin. A prognostic panel (ER/PR) is pending and will be reported in an addendum 3. E-cadherin is positive supporting a ductal origin. The focus is less pronounced on the deeper sections and in isolation would likely be considered atypical ductal hyperplasia.   06/25/2020 Initial Diagnosis   Ductal carcinoma in situ (DCIS) of left breast     Receptors her2   2. PROGNOSTIC INDICATORS Results: IMMUNOHISTOCHEMICAL AND MORPHOMETRIC ANALYSIS PERFORMED MANUALLY Estrogen Receptor: 95%, POSITIVE, STRONG STAINING INTENSITY Progesterone Receptor: 30%, POSITIVE, STRONG STAINING INTENSITY   07/03/2020 Breast MRI   IMPRESSION: 1. Known RIGHT breast cancer, 12 o'clock axis, at anterior depth, measuring 1 cm greatest extent, manifesting as a spiculated enhancing mass on MRI, with associated biopsy clip. Expected post biopsy changes are seen within the adjacent outer RIGHT breast. 2. No evidence of additional multifocal or multicentric disease within the  RIGHT breast. 3. Known LEFT breast DCIS within the slightly outer LEFT breast, at anterior depth, corresponding to the biopsy site labeled 1 o'clock axis, with associated enhancement only at the margins of the biopsy cavity measuring up to 5 mm greatest dimension. 4. Known LEFT breast DCIS within the upper central LEFT breast, at middle depth, corresponding to the biopsy site labeled 12 o'clock axis. Contiguous linear non-mass enhancement extends 2.3 cm superior-medial to the biopsy cavity, most likely post biopsy  change but possibly contiguous extent of disease. 5. No evidence of additional multifocal or multicentric disease within the LEFT breast. 6. No evidence of metastatic lymphadenopathy.     07/26/2020 Surgery   LEFT BREAST LUMPECTOMY X 2  WITH RADIOACTIVE SEED LOCALIZATION by Dr Marlou Starks   07/26/2020 Pathology Results   FINAL MICROSCOPIC DIAGNOSIS:   A. LYMPH NODE, RIGHT AXILLARY #1, SENTINEL, EXCISION:  - Lymph node, negative for carcinoma (0/1)   B. LYMPH NODE, RIGHT AXILLARY, SENTINEL, EXCISION:  - Benign fibroadipose tissue, negative for carcinoma   C. BREAST, RIGHT, LUMPECTOMY:  - Invasive ductal carcinoma, 1.5 cm, grade 2  - Ductal carcinoma in situ, low grade  - Resection margins are negative for carcinoma; closest is the anterior  margin of 0.2 cm  - Biopsy site changes  - See oncology table   D. BREAST, LEFT, LUMPECTOMY:  - Benign breast parenchyma with prominent biopsy-related changes  - Negative for residual ductal carcinoma in situ  - See oncology table   E. LYMPH NODE, RIGHT AXILLARY #2, SENTINEL, EXCISION:  - Invasive ductal carcinoma, see comment     COMMENT:   E.  Lymph node tissue is not identified.  Findings likely represent an  entirely replaced lymph node with foci of extranodal extension.       CURRENT THERAPY:  Adjuvant Docetaxel and Cytoxan q3weeks for 4 cycles starting 08/30/20. Given skin rash, changes taxol to Abraxane starting with C2 (09/21/20). Completed 11/02/20.  PENDING Radiation  INTERVAL HISTORY:  Frances VIDAURRI is here for a follow up of left breast cancer. She was last seen by me 10/12/20. She presents to the clinic with her husband. I reviewed her medication list with her. She notes she stopped multiple supplements while she completes chemo. She notes she is more tired and she moves slower at home. She is still doing things at home. She notes she will have varicose veins that pop up and then go away. She does have ankle swelling. She has  more skin peeling and sharp tingling shooting pain around her body after recent chemo. This only lasts seconds. She notes for constipation she has been using a form of magnesium citrate. She notes she still has seroma.    REVIEW OF SYSTEMS:   Constitutional: Denies fevers, chills or abnormal weight loss (+) Fatigue  Eyes: Denies blurriness of vision Ears, nose, mouth, throat, and face: Denies mucositis or sore throat Respiratory: Denies cough, dyspnea or wheezes Cardiovascular: Denies palpitation, chest discomfort or lower extremity swelling Gastrointestinal:  Denies nausea, heartburn (+) Constipation  Skin: Denies abnormal skin rashes (+) Dry skin  Lymphatics: Denies new lymphadenopathy or easy bruising Neurological:Denies numbness, tingling or new weaknesses (+) Occasional shooting pain in LE Behavioral/Psych: Mood is stable, no new changes  All other systems were reviewed with the patient and are negative.  MEDICAL HISTORY:  Past Medical History:  Diagnosis Date   Anal fissure    Atrial septal aneurysm / if pfo  echo 6 13  10/23/2011   Breast  cancer ([place])    Cataract    both eyes   Depression    Fatty liver    "pre fatty liver"   Fibromyalgia    Headache(784.0)    hx of migraines when younger   Heart palpitations    hx with normal holter event monitoring   Hx: UTI (urinary tract infection)    Hyperlipidemia    Hypertension    Hypothyroidism    Obesity    Pneumonia 1972   hx of   Polyp of colon    Serrated adenoma of colon 08/2012   Skin cancer    basal, squamous cell   Sleep apnea    uses cpap    SURGICAL HISTORY: Past Surgical History:  Procedure Laterality Date   BREAST LUMPECTOMY WITH RADIOACTIVE SEED AND SENTINEL LYMPH NODE BIOPSY Right 07/26/2020   Procedure: RIGHT BREAST LUMPECTOMY WITH RADIOACTIVE SEED AND SENTINEL LYMPH NODE BIOPSY;  Surgeon: Jovita Kussmaul, MD;  Location: [facility];  Service: General;  Laterality: Right;   BREAST LUMPECTOMY WITH RADIOACTIVE  SEED LOCALIZATION Left 07/26/2020   Procedure: LEFT BREAST LUMPECTOMY X 2  WITH RADIOACTIVE SEED LOCALIZATION;  Surgeon: Jovita Kussmaul, MD;  Location: [facility];  Service: General;  Laterality: Left;   COLONOSCOPY     [facility] ARTHROSCOPY     both in past   POLYPECTOMY     [place] Left 07/07/2013   Procedure: LEFT SHOULDER MINI OPEN SUBACROMIAL DECOMPRESSION ROTATOR CUFF REPAIR AND POSSIBLE PATCH GRAFT ;  Surgeon: Johnn Hai, MD;  Location: [facility];  Service: Orthopedics;  Laterality: Left;  with interscaline block   SKIN CANCER EXCISION Bilateral    arm, legs, and chest   TONSILLECTOMY      I have reviewed the social history and family history with the patient and they are unchanged from previous note.  ALLERGIES:  is allergic to penicillins, taxotere [docetaxel], cefdinir, norvasc [amlodipine besylate], cetirizine, irbesartan, molds & smuts, pegfilgrastim, and tylenol [acetaminophen].  MEDICATIONS:  Current Outpatient Medications  Medication Sig Dispense Refill   Ascorbic Acid (VITAMIN C) 1000 MG tablet Take 1,000 mg by mouth daily.     b complex vitamins capsule Take 1 capsule by mouth daily.     Bioflavonoid Products (QUERCETIN COMPLEX IMMUNE PO) Take 1 capsule by mouth daily.     Cholecalciferol (VITAMIN D) 50 MCG (2000 UT) tablet Take 4,000 Units by mouth daily.     dexamethasone (DECADRON) 4 MG tablet Take 2 tablets (8 mg total) by mouth 2 (two) times daily. Start the day before Taxotere. Then again the day after chemo for 3 days. 30 tablet 1   hydrochlorothiazide (HYDRODIURIL) 25 MG tablet TAKE 1 TABLET BY MOUTH EVERY DAY 90 tablet 0   hydroxychloroquine (PLAQUENIL) 200 MG tablet Take 200 mg by mouth 2 (two) times a week.     lidocaine-prilocaine (EMLA) cream Apply to affected area once 30 g 3   LORazepam (ATIVAN) 1 MG tablet Take 0.5-1 tablets (0.5-1 mg total) by mouth every 8  (eight) hours as needed for anxiety. anxiety 20 tablet 0   MAGNESIUM CARBONATE PO Take 1 tablet by mouth daily.     melatonin 5 MG TABS Take 5 mg by mouth at bedtime.     Menaquinone-7 (VITAMIN K2) 100 MCG CAPS Take 100 mcg by mouth daily.     metoprolol succinate (TOPROL XL) 25 MG 24  hr tablet TAKE ONCE A DAY AS DIRECTED (Patient taking differently: Take by mouth daily as needed (Blood pressure). TAKE ONCE A DAY AS DIRECTED) 30 tablet 1   Multiple Vitamin (MULTIVITAMIN) tablet Take 1 tablet by mouth daily.     NON FORMULARY Take 48.6 mg by mouth See admin instructions. Thyroid desiccated (procine) sr 2 capsules 5 days a week, 3 capsules 2 days per week (Tues and Fri)     OLIVE LEAF EXTRACT PO Take 1 capsule by mouth daily.     ondansetron (ZOFRAN) 8 MG tablet Take 1 tablet (8 mg total) by mouth 2 (two) times daily as needed for refractory nausea / vomiting. Start on day 3 after chemo. 30 tablet 1   OVER THE COUNTER MEDICATION Take 2 capsules by mouth in the morning and at bedtime. Ifnla Med     OVER THE COUNTER MEDICATION Take 1 capsule by mouth daily. Essential Pro     potassium chloride SA (KLOR-CON) 20 MEQ tablet Take 1 tablet by mouth once daily **Schedule appt for more refills** 90 tablet 0   Probiotic Product (PROBIOTIC PO) Take 1 capsule by mouth daily.     prochlorperazine (COMPAZINE) 10 MG tablet Take 1 tablet (10 mg total) by mouth every 6 (six) hours as needed (Nausea or vomiting). 30 tablet 1   traMADol (ULTRAM) 50 MG tablet Take 1-2 tablets (50-100 mg total) by mouth every 6 (six) hours as needed. 20 tablet 1   UBIQUINOL PO Take by mouth.     VITAMIN E PO Take 23 mg by mouth daily.     ZINC GLUCONATE PO Take 10 mg by mouth daily.     No current facility-administered medications for this visit.   Facility-Administered Medications Ordered in Other Visits  Medication Dose Route Frequency Provider Last Rate Last Admin   cyclophosphamide (CYTOXAN) 1,040 mg in sodium chloride 0.9 %  250 mL chemo infusion  500 mg/m2 (Treatment Plan Recorded) Intravenous Once Truitt Merle, MD       heparin lock flush 100 unit/mL  500 Units Intracatheter Once PRN Truitt Merle, MD       PACLitaxel-protein bound (ABRAXANE) chemo infusion 400 mg  190 mg/m2 (Treatment Plan Recorded) Intravenous Once Truitt Merle, MD 160 mL/hr at 11/02/20 1221 400 mg at 11/02/20 1221   sodium chloride flush (NS) 0.9 % injection 10 mL  10 mL Intracatheter PRN Truitt Merle, MD        PHYSICAL EXAMINATION: ECOG PERFORMANCE STATUS: 1 - Symptomatic but completely ambulatory  Vitals:   11/02/20 0957  BP: (!) 159/75  Pulse: 77  Resp: 17  Temp: 97.7 F (36.5 C)  SpO2: 100%   Filed Weights   11/02/20 0957  Weight: 209 lb 1.6 oz (94.8 kg)    GENERAL:alert, no distress and comfortable SKIN: skin color, texture, turgor are normal, no rashes or significant lesions EYES: normal, Conjunctiva are pink and non-injected, sclera clear  NECK: supple, thyroid normal size, non-tender, without nodularity LYMPH:  no palpable lymphadenopathy in the cervical, axillary  LUNGS: clear to auscultation and percussion with normal breathing effort HEART: regular rate & rhythm and no murmurs and no lower extremity edema ABDOMEN:abdomen soft, non-tender and normal bowel sounds Musculoskeletal:no cyanosis of digits and no clubbing  NEURO: alert & oriented x 3 with fluent speech, no focal motor/sensory deficits BREAST: S/p b/l lumpectomy: Right axillary Seroma smaller, 2-3cm. No palpable mass, nodules or adenopathy bilaterally. Breast exam benign.   LABORATORY DATA:  I have reviewed the data as  listed CBC Latest Ref Rng & Units 11/02/2020 10/12/2020 09/25/2020  WBC 4.0 - 10.5 K/uL 6.1 6.2 5.8  Hemoglobin 12.0 - 15.0 g/dL 12.3 12.5 12.9  Hematocrit 36.0 - 46.0 % 36.4 37.7 38.0  Platelets 150 - 400 K/uL 216 184 212     CMP Latest Ref Rng & Units 11/02/2020 10/12/2020 09/25/2020  Glucose 70 - 99 mg/dL 86 95 103(H)  BUN 8 - 23 mg/dL $Remove'18 18 21   'YdyNXhA$ Creatinine 0.44 - 1.00 mg/dL 0.71 0.76 0.89  Sodium 135 - 145 mmol/L 143 140 138  Potassium 3.5 - 5.1 mmol/L 4.6 4.3 3.8  Chloride 98 - 111 mmol/L 108 105 99  CO2 22 - 32 mmol/L $RemoveB'24 26 30  'tptiSxCA$ Calcium 8.9 - 10.3 mg/dL 9.5 9.5 9.9  Total Protein 6.5 - 8.1 g/dL 7.2 6.8 7.0  Total Bilirubin 0.3 - 1.2 mg/dL 0.5 0.5 0.9  Alkaline Phos 38 - 126 U/L 153(H) 170(H) 112  AST 15 - 41 U/L 49(H) 43(H) 32  ALT 0 - 44 U/L 95(H) 97(H) 54(H)      RADIOGRAPHIC STUDIES: I have personally reviewed the radiological images as listed and agreed with the findings in the report. No results found.   ASSESSMENT & PLAN:  [name] is a 65 y.o. female with    1. 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 She was found to have b/l masses on mammogram. On biopsy she was seen to have 2 small area of  DCIS in left breast and 0.8cm right breast invasive ductal carcinoma. -She underwent B/l lumpectomy and right SNLB by Dr Marlou Starks on 07/26/20. Her surgical path showed her left breast DCIS was completely removed with biopsy and no residual DCIS on path. Her grade II, 1.5cm invasive ductal carcinoma of right breast was completely removed with clear margins and 1/2 positive LNs. -Given positive LN, Mammaprint was done and results show high risk of Luminal Type B with 29% risk of distant recurrence. She has 94.6% chance of 5-year recurrence free with chemotherapy and hormonal therapy.  -Staging CT C/A/P on 08/20/20 and bone scan on 08/28/20 were negative for metastatic disease. -Given her high risk of cancer I started her on adjuvant chemotherapy with moderately intensive Docetaxel and Cytoxan q3weeks for 4 cycles on 08/30/20. Given skin rash, I changed her Docetaxel to Abraxane starting with C2 on 09/21/20. Given poor tolerance, will only give filgrastim as needed. -If she is  not able to tolerate this regimen, will change to less intensive weekly chemo for the rest of treatment.  -S/p C3 she is tolerating Abraxane/Cytoxan moderately well with more fatigue, skin changes and nerve side effects. I reviewed management with her. Labs reviewed and stable. Overall adequate to proceed with last cycle Abraxane and Cytoxan today at same dose.  -She will proceed with Adjuvant Radiation soon. Afterward, will proceed with antiestrogen therapy.  -f/u in 4 weeks.    2. Comorbidities: Depression, Fibromyalgia, HLD, HTN, Hypothyroidism, H/o Skin cancer (squamous and basal cell), sleep apnea -She notes having Adrenal fatigue. I discussed based on her labs, this is not consistent with adrenal insufficiency. I recommend she see endocrinologist -She is followed for HTN. She takes her BP at home. The bottom number is always low, and the top number goes to normal when she lays down. -On medications, continue to f/u with her other physicians.   3. Genetic  testing, she will think about it.    4.  Fatty liver and transaminitis -She has chronic intermittent transaminitis, which she contributes to fatty liver.  She is on low sugar diet and drinks a lot of water -Liver morphology was unremarkable on recent CT scan -Labs reviewed-- AST 78, ALT 101 on 08/30/20.  -She does not drink alcohol, she knows to avoid Tylenol. -Her LFTs fluctuate.    5. Constipation -She notes she has been using fiber rich supplements and Magnesium. I discussed OTC options if needed.  -She also has been taking Magnesium citrate. I recommend switching to Miralax which is milder and alter dose as needed.      PLAN:  -Labs reviewed and adequate to proceed with C4 Abraxane and Cytoxan today at same dose.  -Take Benadryl after infusion today and 2-3 tabs daily on day 2 and 3 to prevent allergy reaction.  -she will proceed with radiation in  about 3 weeks  -Lab and f/u in 4 weeks.    No problem-specific Assessment &  Plan notes found for this encounter.   No orders of the defined types were placed in this encounter.  All questions were answered. The patient knows to call the clinic with any problems, questions or concerns. No barriers to learning was detected. The total time spent in the appointment was 35 minutes.     Truitt Merle, MD 11/02/2020   I, Joslyn Devon, am acting as scribe for Truitt Merle, MD.   I have reviewed the above documentation for accuracy and completeness, and I agree with the above.

## 2020-10-31 DIAGNOSIS — R69 Illness, unspecified: Secondary | ICD-10-CM | POA: Diagnosis not present

## 2020-11-02 ENCOUNTER — Other Ambulatory Visit: Payer: Self-pay

## 2020-11-02 ENCOUNTER — Inpatient Hospital Stay: Payer: Medicare HMO

## 2020-11-02 ENCOUNTER — Encounter: Payer: Self-pay | Admitting: Hematology

## 2020-11-02 ENCOUNTER — Inpatient Hospital Stay: Payer: Medicare HMO | Admitting: Hematology

## 2020-11-02 ENCOUNTER — Encounter: Payer: Self-pay | Admitting: Physician Assistant

## 2020-11-02 ENCOUNTER — Encounter: Payer: Self-pay | Admitting: *Deleted

## 2020-11-02 VITALS — BP 137/69 | HR 67 | Temp 98.6°F | Resp 18

## 2020-11-02 VITALS — BP 159/75 | HR 77 | Temp 97.7°F | Resp 17 | Ht 66.0 in | Wt 209.1 lb

## 2020-11-02 DIAGNOSIS — M797 Fibromyalgia: Secondary | ICD-10-CM | POA: Diagnosis not present

## 2020-11-02 DIAGNOSIS — Z170 Estrogen receptor positive status [ER+]: Secondary | ICD-10-CM

## 2020-11-02 DIAGNOSIS — E785 Hyperlipidemia, unspecified: Secondary | ICD-10-CM | POA: Diagnosis not present

## 2020-11-02 DIAGNOSIS — Z79899 Other long term (current) drug therapy: Secondary | ICD-10-CM | POA: Diagnosis not present

## 2020-11-02 DIAGNOSIS — C50411 Malignant neoplasm of upper-outer quadrant of right female breast: Secondary | ICD-10-CM

## 2020-11-02 DIAGNOSIS — K5900 Constipation, unspecified: Secondary | ICD-10-CM | POA: Diagnosis not present

## 2020-11-02 DIAGNOSIS — G4730 Sleep apnea, unspecified: Secondary | ICD-10-CM | POA: Diagnosis not present

## 2020-11-02 DIAGNOSIS — Z5111 Encounter for antineoplastic chemotherapy: Secondary | ICD-10-CM | POA: Diagnosis not present

## 2020-11-02 DIAGNOSIS — D0512 Intraductal carcinoma in situ of left breast: Secondary | ICD-10-CM | POA: Diagnosis not present

## 2020-11-02 DIAGNOSIS — I10 Essential (primary) hypertension: Secondary | ICD-10-CM | POA: Diagnosis not present

## 2020-11-02 LAB — CBC WITH DIFFERENTIAL (CANCER CENTER ONLY)
Abs Immature Granulocytes: 0.01 10*3/uL (ref 0.00–0.07)
Basophils Absolute: 0.1 10*3/uL (ref 0.0–0.1)
Basophils Relative: 1 %
Eosinophils Absolute: 0.1 10*3/uL (ref 0.0–0.5)
Eosinophils Relative: 2 %
HCT: 36.4 % (ref 36.0–46.0)
Hemoglobin: 12.3 g/dL (ref 12.0–15.0)
Immature Granulocytes: 0 %
Lymphocytes Relative: 31 %
Lymphs Abs: 1.9 10*3/uL (ref 0.7–4.0)
MCH: 30.8 pg (ref 26.0–34.0)
MCHC: 33.8 g/dL (ref 30.0–36.0)
MCV: 91 fL (ref 80.0–100.0)
Monocytes Absolute: 0.6 10*3/uL (ref 0.1–1.0)
Monocytes Relative: 9 %
Neutro Abs: 3.5 10*3/uL (ref 1.7–7.7)
Neutrophils Relative %: 57 %
Platelet Count: 216 10*3/uL (ref 150–400)
RBC: 4 MIL/uL (ref 3.87–5.11)
RDW: 14.5 % (ref 11.5–15.5)
WBC Count: 6.1 10*3/uL (ref 4.0–10.5)
nRBC: 0 % (ref 0.0–0.2)

## 2020-11-02 LAB — CMP (CANCER CENTER ONLY)
ALT: 95 U/L — ABNORMAL HIGH (ref 0–44)
AST: 49 U/L — ABNORMAL HIGH (ref 15–41)
Albumin: 3.9 g/dL (ref 3.5–5.0)
Alkaline Phosphatase: 153 U/L — ABNORMAL HIGH (ref 38–126)
Anion gap: 11 (ref 5–15)
BUN: 18 mg/dL (ref 8–23)
CO2: 24 mmol/L (ref 22–32)
Calcium: 9.5 mg/dL (ref 8.9–10.3)
Chloride: 108 mmol/L (ref 98–111)
Creatinine: 0.71 mg/dL (ref 0.44–1.00)
GFR, Estimated: >60 mL/min (ref >60)
Glucose, Bld: 86 mg/dL (ref 70–99)
Potassium: 4.6 mmol/L (ref 3.5–5.1)
Sodium: 143 mmol/L (ref 135–145)
Total Bilirubin: 0.5 mg/dL (ref 0.3–1.2)
Total Protein: 7.2 g/dL (ref 6.5–8.1)

## 2020-11-02 MED ORDER — DIPHENHYDRAMINE HCL 25 MG PO CAPS
50.0000 mg | ORAL_CAPSULE | Freq: Once | ORAL | Status: AC
  Administered 2020-11-02: 50 mg via ORAL

## 2020-11-02 MED ORDER — METHYLPREDNISOLONE SODIUM SUCC 125 MG IJ SOLR
125.0000 mg | Freq: Once | INTRAMUSCULAR | Status: AC | PRN
  Administered 2020-11-02: 60 mg via INTRAVENOUS

## 2020-11-02 MED ORDER — HEPARIN SOD (PORK) LOCK FLUSH 100 UNIT/ML IV SOLN
500.0000 [IU] | Freq: Once | INTRAVENOUS | Status: DC | PRN
  Filled 2020-11-02: qty 5

## 2020-11-02 MED ORDER — FAMOTIDINE 20 MG IN NS 100 ML IVPB
20.0000 mg | Freq: Once | INTRAVENOUS | Status: AC
  Administered 2020-11-02: 20 mg via INTRAVENOUS

## 2020-11-02 MED ORDER — DIPHENHYDRAMINE HCL 25 MG PO CAPS
ORAL_CAPSULE | ORAL | Status: AC
  Filled 2020-11-02: qty 2

## 2020-11-02 MED ORDER — FAMOTIDINE 20 MG IN NS 100 ML IVPB
INTRAVENOUS | Status: AC
  Filled 2020-11-02: qty 100

## 2020-11-02 MED ORDER — PALONOSETRON HCL INJECTION 0.25 MG/5ML
INTRAVENOUS | Status: AC
  Filled 2020-11-02: qty 5

## 2020-11-02 MED ORDER — SODIUM CHLORIDE 0.9% FLUSH
10.0000 mL | INTRAVENOUS | Status: DC | PRN
  Filled 2020-11-02: qty 10

## 2020-11-02 MED ORDER — DEXAMETHASONE SODIUM PHOSPHATE 100 MG/10ML IJ SOLN
10.0000 mg | Freq: Once | INTRAMUSCULAR | Status: AC
  Administered 2020-11-02: 10 mg via INTRAVENOUS
  Filled 2020-11-02: qty 10

## 2020-11-02 MED ORDER — PACLITAXEL PROTEIN-BOUND CHEMO INJECTION 100 MG
190.0000 mg/m2 | Freq: Once | INTRAVENOUS | Status: AC
  Administered 2020-11-02: 400 mg via INTRAVENOUS
  Filled 2020-11-02: qty 80

## 2020-11-02 MED ORDER — PALONOSETRON HCL INJECTION 0.25 MG/5ML
0.2500 mg | Freq: Once | INTRAVENOUS | Status: AC
  Administered 2020-11-02: 0.25 mg via INTRAVENOUS

## 2020-11-02 MED ORDER — SODIUM CHLORIDE 0.9 % IV SOLN
500.0000 mg/m2 | Freq: Once | INTRAVENOUS | Status: AC
  Administered 2020-11-02: 1040 mg via INTRAVENOUS
  Filled 2020-11-02: qty 52

## 2020-11-02 MED ORDER — SODIUM CHLORIDE 0.9 % IV SOLN
Freq: Once | INTRAVENOUS | Status: AC
  Filled 2020-11-02: qty 250

## 2020-11-02 MED ORDER — FAMOTIDINE 20 MG IN NS 100 ML IVPB
20.0000 mg | Freq: Once | INTRAVENOUS | Status: AC | PRN
  Administered 2020-11-02: 20 mg via INTRAVENOUS

## 2020-11-02 NOTE — Patient Instructions (Signed)
[facility]  Discharge Instructions: Thank you for choosing [facility] to provide your oncology and hematology care.   If you have a lab appointment with the [facility], please go directly to the [facility] and check in at the registration area.   Wear comfortable clothing and clothing appropriate for easy access to any Portacath or PICC line.   We strive to give you quality time with your provider. You may need to reschedule your appointment if you arrive late (15 or more minutes).  Arriving late affects you and other patients whose appointments are after yours.  Also, if you miss three or more appointments without notifying the office, you may be dismissed from the clinic at the provider's discretion.      For prescription refill requests, have your pharmacy contact our office and allow 72 hours for refills to be completed.    Today you received the following chemotherapy and/or immunotherapy agents: paclitaxel-protein bound and cyclophosphamide.      To help prevent nausea and vomiting after your treatment, we encourage you to take your nausea medication as directed.  BELOW ARE SYMPTOMS THAT SHOULD BE REPORTED IMMEDIATELY: *FEVER GREATER THAN 100.4 F (38 C) OR HIGHER *CHILLS OR SWEATING *NAUSEA AND VOMITING THAT IS NOT CONTROLLED WITH YOUR NAUSEA MEDICATION *UNUSUAL SHORTNESS OF BREATH *UNUSUAL BRUISING OR BLEEDING *URINARY PROBLEMS (pain or burning when urinating, or frequent urination) *BOWEL PROBLEMS (unusual diarrhea, constipation, pain near the anus) TENDERNESS IN MOUTH AND THROAT WITH OR WITHOUT PRESENCE OF ULCERS (sore throat, sores in mouth, or a toothache) UNUSUAL RASH, SWELLING OR PAIN  UNUSUAL VAGINAL DISCHARGE OR ITCHING   Items with * indicate a potential emergency and should be followed up as soon as possible or go to the Emergency Department if any problems should occur.  Please show the CHEMOTHERAPY ALERT CARD or  IMMUNOTHERAPY ALERT CARD at check-in to the Emergency Department and triage nurse.  Should you have questions after your visit or need to cancel or reschedule your appointment, please contact [facility]  Dept: [phone]  and follow the prompts.  Office hours are 8:00 a.m. to 4:30 p.m. Monday - Friday. Please note that voicemails left after 4:00 p.m. may not be returned until the following business day.  We are closed weekends and major holidays. You have access to a nurse at all times for urgent questions. Please call the main number to the clinic Dept: [phone] and follow the prompts.   For any non-urgent questions, you may also contact your provider using MyChart. We now offer e-Visits for anyone 82 and older to request care online for non-urgent symptoms. For details visit [email or web address].   Also download the MyChart app! Go to the app store, search "MyChart", open the app, select [facility], and log in with your MyChart username and password.  Due to Covid, a mask is required upon entering the hospital/clinic. If you do not have a mask, one will be given to you upon arrival. For doctor visits, patients may have 1 support person aged 88 or older with them. For treatment visits, patients cannot have anyone with them due to current Covid guidelines and our immunocompromised population.   N

## 2020-11-02 NOTE — Progress Notes (Signed)
Per Dr. Burr Medico OK to treat with today's labs   At start of cytoxin infusion pt c/o of dizziness and "thickness" in the throat. Infusion was stopped and emergency protocol started. See MAR. Dr. Burr Medico notified and ordered to restart infusion once symptoms resolved. Infusion restarted and pt had similar complaints. Dede Query, PA, called to chair. See MAR. RN ordered to restart infusion after pepcid given. Patient's symptoms resolved and pt was discharged in stable condition.

## 2020-11-02 NOTE — Progress Notes (Unsigned)
I evaluated Ms. [name] after patient reported that her tongue was swelling and there was discomfort in her throat. In addition, she was experiencing a "burning sensation" in the left forearm. Lungs were clear to auscultation. Regular rhythm and rate. Vitals were stable.   I recommended Pepcid 20 mg since patient already received Benadryl 50 mg and Solumedrol 125 mg earlier during the infusion. In addition, we recommended applying warm compresses to the left forearm. Patient reports symptoms improved with the above interventions so I recommended to resume Cytoxan infusion and continue to closely monitor symptoms.

## 2020-11-04 DIAGNOSIS — R1111 Vomiting without nausea: Secondary | ICD-10-CM | POA: Diagnosis not present

## 2020-11-04 DIAGNOSIS — R5383 Other fatigue: Secondary | ICD-10-CM | POA: Diagnosis not present

## 2020-11-04 DIAGNOSIS — I10 Essential (primary) hypertension: Secondary | ICD-10-CM | POA: Diagnosis not present

## 2020-11-04 DIAGNOSIS — R0982 Postnasal drip: Secondary | ICD-10-CM | POA: Diagnosis not present

## 2020-11-04 DIAGNOSIS — E860 Dehydration: Secondary | ICD-10-CM | POA: Diagnosis not present

## 2020-11-04 DIAGNOSIS — R531 Weakness: Secondary | ICD-10-CM | POA: Diagnosis not present

## 2020-11-04 DIAGNOSIS — R112 Nausea with vomiting, unspecified: Secondary | ICD-10-CM | POA: Diagnosis not present

## 2020-11-04 DIAGNOSIS — R1084 Generalized abdominal pain: Secondary | ICD-10-CM | POA: Diagnosis not present

## 2020-11-06 ENCOUNTER — Encounter: Payer: Self-pay | Admitting: Hematology

## 2020-11-07 DIAGNOSIS — G4733 Obstructive sleep apnea (adult) (pediatric): Secondary | ICD-10-CM | POA: Diagnosis not present

## 2020-11-08 DIAGNOSIS — M9903 Segmental and somatic dysfunction of lumbar region: Secondary | ICD-10-CM | POA: Diagnosis not present

## 2020-11-08 DIAGNOSIS — M9901 Segmental and somatic dysfunction of cervical region: Secondary | ICD-10-CM | POA: Diagnosis not present

## 2020-11-08 DIAGNOSIS — M9902 Segmental and somatic dysfunction of thoracic region: Secondary | ICD-10-CM | POA: Diagnosis not present

## 2020-11-08 DIAGNOSIS — M531 Cervicobrachial syndrome: Secondary | ICD-10-CM | POA: Diagnosis not present

## 2020-11-09 ENCOUNTER — Telehealth: Payer: Self-pay | Admitting: Hematology

## 2020-11-09 ENCOUNTER — Telehealth: Payer: Self-pay

## 2020-11-09 NOTE — Telephone Encounter (Signed)
I spoke with [name] regarding her symptoms since her last chemotherapy treatment.  The stabbing pains about her body have lessened but this is still occurring.  Her nausea has resolved.  Overall she is doing better.

## 2020-11-09 NOTE — Telephone Encounter (Signed)
Sch per 06/24 los, pt aware.

## 2020-11-12 NOTE — Progress Notes (Signed)
Radiation Oncology         [phone] ________________________________   Outpatient follow-up  Name: [name] MRN: [id number]  Date: 11/14/2020  DOB: 08-27-1955  [id number], Standley Brooking, MD  Truitt Merle, MD   REFERRING PHYSICIAN: Truitt Merle, MD  DIAGNOSIS:    ICD-10-CM   1. Malignant neoplasm of upper-outer quadrant of right breast in female, estrogen receptor positive [facility])  C50.411 Ambulatory referral to Social Work   Z17.0     2. Ductal carcinoma in situ (DCIS) of left breast  D05.12     Cancer Staging Ductal carcinoma in situ (DCIS) of left breast Staging form: Breast, AJCC 8th Edition - Clinical stage from 06/21/2020: Stage 0 (cTis (DCIS), cN0, cM0, G2, ER+, PR+, HER2: Not Assessed) - Signed by Truitt Merle, MD on 06/26/2020 Stage prefix: Initial diagnosis  Malignant neoplasm of upper-outer quadrant of right breast in female, estrogen receptor positive ([place]) Staging form: Breast, AJCC 8th Edition - Clinical stage from 06/21/2020: Stage IA (cT1b, cN0, cM0, G2, ER+, PR+, HER2-) - Signed by Truitt Merle, MD on 06/26/2020 Stage prefix: Initial diagnosis - Pathologic stage from 07/26/2020: Stage IA (pT1c, pN1a, cM0, G2, ER+, PR+, HER2-) - Signed by Truitt Merle, MD on 08/09/2020 Stage prefix: Initial diagnosis Nuclear grade: G2 Histologic grading system: 3 grade system Residual tumor (R): R0 - None   CHIEF COMPLAINT: Here to discuss management of bilateral breast cancer  HISTORY OF PRESENT ILLNESS::[name] is a 65 y.o. female who presented with breast abnormality on the following imaging:  bilateral screening mammogram. No symptoms were reported at that time. Ultrasound of breasts on 06/08/2020 revealed a 9 mm mass in the 12 o'clock position of the right breast with imaging features highly suspicious for malignancy. It also showed a 4 mm group of indeterminate calcifications in the 12 o'clock position of the left breast and a 4 mm group of indeterminate calcifications in the 1  o'clock position of the left breast. Biopsy on the date of 06/21/2020 showed the following: 1. Right breast mass at the 12 o'clock position: invasive mammary carcinoma with mammary carcinoma in situ. E-cadherin was positive, supporting a ductal origin. ER status: 95% strong; PR status: 40% moderate; Her2 status: negative; Grade: 2. 2. Left breast mass at the 12 o'clock position: mammary carcinoma in situ with necrosis and calcifications. E-cadherin positive, supporting a ductal origin. ER status: 95% strong; PR status: 30% strong; Grade 2. 3. Left breast mass at the 1 o'clock position: mammary carcinoma in situ with necrosis and calcifications. E-cadherin was positive, supporting a ductal origin.  MRI of both breasts taken on 07/03/20 revealed:  Right breast: known breast cancer was seen at the 12 o'clock axis, at anterior depth, measuring 1 cm at the greatest extent, noted to be visualzied as a spiculated enhancing mass. Otherweise there was no evidence of additional multifocal or multicentric disease within the right breast. Left Breast: DCIS of the outer left breast at an anterior depth showed some associated enhancement only at the margins of the biopsy cavity measuring up to 5 mm in the greastest dimension. DCIS within the upper central left breast at middle depth: The contiguous linear non-mass enhancement extends 2.3 cm superior-medial to the biopsy cavity, most likely post biopsy change but possibly contiguous extent of disease. There was otherwise no evidence of additional multifocal or multicentric disease seen within the left breast.  The patient underwent bilateral lumpectomy with sentinel lymph node biopsy on 07/26/20 under Dr. Marlou Starks. Pathology revelaled:  Right  breast lumpectomy: invasive ductal carcinoma, 1.5 cm, grade 2, and low-grade ductal carcinoma in situ. Resection margins were negative for carcinoma. ER 95% positive, PR 30% positve, both with strong staining intensity, Her2 negative,  and Ki67 10%. Left breast lumpectomy: benign breast parenchyma with prominent biopsy-related changes. Resuts were negative for residual ductal carcinoma in situ, and all margins were negative for DCIS. ER 95% positve, PR 30% positive, both with strong staining intensity per biopsy Sentinel nodes: one of 2 right axillary lymph node was indicative of invasive ductal carcinoma, however, it was noted that the lymph node tissue "is not identified, findings likely represent an entirely replaced lymph node with foci of extranodal extension".   The patient received mamaprint testing resutls on 07/26/20. Results showed high risk of Luminal Type B, with a 29% risk of recurrence in 10 years if untreated. Her Mammaprint index is -0.175. She has 94.6% benefit of chemotherapy and hormaonal therapy.  CT of abdomen and pelvis taken on 08/20/20 revealed no definitive findings suggestive of metastatic disease in the chest, abdomen, or pelvis. Imaging did reveal what appears to be a large postoperative seroma in the right axilla.  On 08/28/20, the patient received a bone scan which showed no definite scintigraphic evidence of osseous metastases.   Chest CT and x-ray taken on 09/05/20 showed no evidence of metastasis.   The patient received chemotherapy (Adjuvant Docetaxel and Cytoxan q3weeks for 4 cycles) from 08/30/20 to 11/02/20.  Of note: the patient [facility] ED on 11/[facility]/20 for deydration brought on by vomitting. The patient reported recurrent vomitting and nausea for the 2 days prior to her visit following her fourth session of chemotherapy on Friday. She was treated with IV fluids and IV Zofran, and sent home with prescription prochlorperazine as well as ondansetron to use prn.   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  No Pacemaker/ICD? No Possible current pregnancy? No--postmenopausal  Is the patient on methotrexate? No   Current Complaints / other details:  Patient has not received any version of the COVID-19 vaccine       PREVIOUS RADIATION THERAPY: No  PAST MEDICAL HISTORY:  has a past medical history of Anal fissure, Atrial septal aneurysm / if pfo  echo 6 13  (10/23/2011), Breast cancer ([place]), Cataract, Depression, Fatty liver, Fibromyalgia, Headache(784.0), Heart palpitations, UTI (urinary tract infection), Hyperlipidemia, Hypertension, Hypothyroidism, Obesity, Pneumonia (1972), Polyp of colon, Serrated adenoma of colon (08/2012), Skin cancer, and Sleep apnea.    PAST SURGICAL HISTORY: Past Surgical History:  Procedure Laterality Date   BREAST LUMPECTOMY WITH RADIOACTIVE SEED AND SENTINEL LYMPH NODE BIOPSY Right 07/26/2020   Procedure: RIGHT BREAST LUMPECTOMY WITH RADIOACTIVE SEED AND SENTINEL LYMPH NODE BIOPSY;  Surgeon: Jovita Kussmaul, MD;  Location: [facility];  Service: General;  Laterality: Right;   BREAST LUMPECTOMY WITH RADIOACTIVE SEED LOCALIZATION Left 07/26/2020   Procedure: LEFT BREAST LUMPECTOMY X 2  WITH RADIOACTIVE SEED LOCALIZATION;  Surgeon: Jovita Kussmaul, MD;  Location: [facility];  Service: General;  Laterality: Left;   COLONOSCOPY     DILATION AND CURETTAGE OF [place] ARTHROSCOPY     both in past   POLYPECTOMY     [place] Left 07/07/2013   Procedure: LEFT SHOULDER  MINI OPEN SUBACROMIAL DECOMPRESSION ROTATOR CUFF REPAIR AND POSSIBLE PATCH GRAFT ;  Surgeon: Johnn Hai, MD;  Location: [facility];  Service: Orthopedics;  Laterality: Left;  with interscaline block   SKIN CANCER EXCISION Bilateral    arm, legs, and chest   TONSILLECTOMY      FAMILY HISTORY: family history includes ADD / ADHD in her child; Cancer in her sister; Hyperlipidemia in her father and another family member; Hypertension in her father and mother;  Juvenile Diabetes in her daughter; Liver disease in her father; Melanoma in her sister; Osteoporosis in her mother.  SOCIAL HISTORY:  reports that she has never smoked. She has never used smokeless tobacco. She reports current alcohol use. She reports that she does not use drugs.  ALLERGIES: Penicillins, Taxotere [docetaxel], Cefdinir, Norvasc [amlodipine besylate], Cetirizine, Irbesartan, Molds & smuts, Pegfilgrastim, and Tylenol [acetaminophen]  MEDICATIONS:  Current Outpatient Medications  Medication Sig Dispense Refill   Ascorbic Acid (VITAMIN C) 1000 MG tablet Take 1,000 mg by mouth daily.     b complex vitamins capsule Take 1 capsule by mouth daily.     Bioflavonoid Products (QUERCETIN COMPLEX IMMUNE PO) Take 1 capsule by mouth daily.     Cholecalciferol (VITAMIN D) 50 MCG (2000 UT) tablet Take 4,000 Units by mouth daily.     dexamethasone (DECADRON) 4 MG tablet Take 2 tablets (8 mg total) by mouth 2 (two) times daily. Start the day before Taxotere. Then again the day after chemo for 3 days. 30 tablet 1   hydrochlorothiazide (HYDRODIURIL) 25 MG tablet TAKE 1 TABLET BY MOUTH EVERY DAY 90 tablet 0   hydroxychloroquine (PLAQUENIL) 200 MG tablet Take 200 mg by mouth 2 (two) times a week.     lidocaine-prilocaine (EMLA) cream Apply to affected area once 30 g 3   LORazepam (ATIVAN) 1 MG tablet Take 0.5-1 tablets (0.5-1 mg total) by mouth every 8 (eight) hours as needed for anxiety. anxiety 20 tablet 0   MAGNESIUM CARBONATE PO Take 1 tablet by mouth daily.     melatonin 5 MG TABS Take 5 mg by mouth at bedtime.     Menaquinone-7 (VITAMIN K2) 100 MCG CAPS Take 100 mcg by mouth daily.     metoprolol succinate (TOPROL XL) 25 MG 24 hr tablet TAKE ONCE A DAY AS DIRECTED (Patient taking differently: Take by mouth daily as needed (Blood pressure). TAKE ONCE A DAY AS DIRECTED) 30 tablet 1   Multiple Vitamin (MULTIVITAMIN) tablet Take 1 tablet by mouth daily.     NON FORMULARY Take 48.6 mg by mouth See  admin instructions. Thyroid desiccated (procine) sr 2 capsules 5 days a week, 3 capsules 2 days per week (Tues and Fri)     OLIVE LEAF EXTRACT PO Take 1 capsule by mouth daily.     ondansetron (ZOFRAN) 8 MG tablet Take 1 tablet (8 mg total) by mouth 2 (two) times daily as needed for refractory nausea / vomiting. Start on day 3 after chemo. 30 tablet 1   OVER THE COUNTER MEDICATION Take 2 capsules by mouth in the morning and at bedtime. Ifnla Med     OVER THE COUNTER MEDICATION Take 1 capsule by mouth daily. Essential Pro     potassium chloride SA (KLOR-CON) 20 MEQ tablet Take 1 tablet by mouth once daily **Schedule appt for more refills** 90 tablet 0   Probiotic Product (PROBIOTIC PO) Take 1 capsule by mouth daily.     prochlorperazine (COMPAZINE) 10 MG tablet Take 1 tablet (10  mg total) by mouth every 6 (six) hours as needed (Nausea or vomiting). 30 tablet 1   traMADol (ULTRAM) 50 MG tablet Take 1-2 tablets (50-100 mg total) by mouth every 6 (six) hours as needed. 20 tablet 1   UBIQUINOL PO Take by mouth.     VITAMIN E PO Take 23 mg by mouth daily.     ZINC GLUCONATE PO Take 10 mg by mouth daily.     No current facility-administered medications for this encounter.    REVIEW OF SYSTEMS: As above   PHYSICAL EXAM:  height is 5' 6" (1.676 m) and weight is 206 lb 8 oz (93.7 kg). Her temporal temperature is 96.8 F (36 C) (abnormal). Her blood pressure is 153/73 (abnormal) and her pulse is 80. Her respiration is 18 and oxygen saturation is 100%.   General: Alert and oriented, in no acute distress HEENT: alopecia Breasts: satisfactory healing of bilateral breast surgical scars     LABORATORY DATA:  Lab Results  Component Value Date   WBC 6.1 11/02/2020   HGB 12.3 11/02/2020   HCT 36.4 11/02/2020   MCV 91.0 11/02/2020   PLT 216 11/02/2020   CMP     Component Value Date/Time   NA 143 11/02/2020 0914   NA 141 01/09/2020 0000   K 4.6 11/02/2020 0914   CL 108 11/02/2020 0914   CO2 24  11/02/2020 0914   GLUCOSE 86 11/02/2020 0914   BUN 18 11/02/2020 0914   BUN 13 06/05/2020 0000   CREATININE 0.71 11/02/2020 0914   CREATININE 0.86 02/06/2011 0937   CALCIUM 9.5 11/02/2020 0914   PROT 7.2 11/02/2020 0914   ALBUMIN 3.9 11/02/2020 0914   AST 49 (H) 11/02/2020 0914   ALT 95 (H) 11/02/2020 0914   ALKPHOS 153 (H) 11/02/2020 0914   BILITOT 0.5 11/02/2020 0914   GFRNONAA >60 11/02/2020 0914   GFRNONAA >60 02/06/2011 0937   GFRAA 95 01/09/2020 0000   GFRAA >60 02/06/2011 0937       RADIOGRAPHY: No results found.    IMPRESSION/PLAN: Left breast DCIS, Right breast cancer node +   It was a pleasure meeting the patient today. We discussed the risks, benefits, and side effects of radiotherapy. I recommend radiotherapy to the right breast and regional nodes, and left breast over 6 weeks to reduce her risk of locoregional recurrence by 2/3.  We discussed that radiation would take approximately 6 weeks to complete. Treatment will be held until 3 wks of healing from chemotherapy.    I reviewed the logistics, benefits, risks, and potential side effects of this treatment in detail. Risks may include but not necessary be limited to acute and late injury tissue in the radiation fields such as skin irritation (change in color/pigmentation, itching, dryness, pain, peeling). She may experience fatigue. We also discussed possible risk of long term cosmetic changes or scar tissue. There is also a smaller risk for lung toxicity, cardiac toxicity, brachial plexopathy, lymphedema, musculoskeletal changes, rib fragility or induction of a second malignancy, late chronic non-healing soft tissue wound.    The patient asked good questions which I answered to her satisfaction. She is enthusiastic about proceeding with treatment. A consent form has been signed and placed in her chart.  On date of service, in total, I spent 35 minutes on this encounter. Patient was seen in person.    __________________________________________   Eppie Gibson, MD   This document serves as a record of services personally performed by Eppie Gibson, MD. It  was created on her behalf by Roney Mans, a trained medical scribe. The creation of this record is based on the scribe's personal observations and the provider's statements to them. This document has been checked and approved by the attending provider.

## 2020-11-13 NOTE — Progress Notes (Signed)
Location of Breast Cancer Malignant neoplasm of upper-outer quadrant of RIGHT breast, estrogen receptor positive  Histology per Pathology Report:  07/26/2020 FINAL MICROSCOPIC DIAGNOSIS:  A. LYMPH NODE, RIGHT AXILLARY #1, SENTINEL, EXCISION:  - Lymph node, negative for carcinoma (0/1)  B. LYMPH NODE, RIGHT AXILLARY, SENTINEL, EXCISION:  - Benign fibroadipose tissue, negative for carcinoma  C. BREAST, RIGHT, LUMPECTOMY:  - Invasive ductal carcinoma, 1.5 cm, grade 2  - Ductal carcinoma in situ, low grade  - Resection margins are negative for carcinoma; closest is the anterior margin of 0.2 cm  - Biopsy site changes  - See oncology table  D. BREAST, LEFT, LUMPECTOMY:  - Benign breast parenchyma with prominent biopsy-related changes  - Negative for residual ductal carcinoma in situ  - See oncology table  E. LYMPH NODE, RIGHT AXILLARY #2, SENTINEL, EXCISION:  - Invasive ductal carcinoma, see comment   Receptor Status: ER(95%), PR (30%)  Did patient present with symptoms (if so, please note symptoms) or was this found on screening mammography?:  Screening mammogram on 06/08/2020 showed 1. 9 x 7 x 6 mm mass in the 12 o'clock position of the right breast, 2cmfn with imaging features highly suspicious for malignancy. 2.4 mm group of indeterminate calcifications in the 12 o'clock position of the left breast and 4 mm group of indeterminate calcifications in the 1 o'clock position of the left breast. Together, the groups span an area measuring 3.9 cm.  Past/Anticipated interventions by surgeon, if any:  07/26/2020 Dr. Autumn Messing --LEFT BREAST LUMPECTOMY X 2  WITH RADIOACTIVE SEED LOCALIZATION (Left) --RIGHT BREAST LUMPECTOMY WITH RADIOACTIVE SEED LOCALIZATION AND DEEP RIGHT AXILLARY SENTINEL LYMPH NODE BIOPSY (Right)  Past/Anticipated interventions by medical oncology, if any:  Under care if Dr, Truitt Merle 11/02/2020 -She underwent B/l lumpectomy and right SNLB by Dr Marlou Starks on 07/26/20. Her  surgical path showed her left breast DCIS was completely removed with biopsy and no residual DCIS on path. Her grade II, 1.5cm invasive ductal carcinoma of right breast was completely removed with clear margins and 1/2 positive LNs. -Given positive LN, Mammaprint was done and results show high risk of Luminal Type B with 29% risk of distant recurrence. She has 94.6% chance of 5-year recurrence free with chemotherapy and hormonal therapy.  -Staging CT C/A/P on 08/20/20 and bone scan on 08/28/20 were negative for metastatic disease. -Given her high risk of cancer I started her on adjuvant chemotherapy with moderately intensive Docetaxel and Cytoxan q3weeks for 4 cycles on 08/30/20. Given skin rash, I changed her Docetaxel to Abraxane starting with C2 on 09/21/20. Given poor tolerance, will only give filgrastim as needed. -If she is not able to tolerate this regimen, will change to less intensive weekly chemo for the rest of treatment.  -S/p C3 she is tolerating Abraxane/Cytoxan moderately well with more fatigue, skin changes and nerve side effects. I reviewed management with her. Labs reviewed and stable. Overall adequate to proceed with last cycle Abraxane and Cytoxan today at same dose. -She will proceed with Adjuvant Radiation soon. Afterward, will proceed with antiestrogen therapy. -f/u in 4 weeks.   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 No Pacemaker/ICD? No Possible current pregnancy? No--postmenopausal  Is the patient on methotrexate? No  Current Complaints / other details:  Patient has not received any version of the COVID-19 vaccine

## 2020-11-14 ENCOUNTER — Other Ambulatory Visit: Payer: Self-pay

## 2020-11-14 ENCOUNTER — Ambulatory Visit
Admission: RE | Admit: 2020-11-14 | Discharge: 2020-11-14 | Disposition: A | Discharge status: Home / Self Care | Payer: Medicare HMO | Source: Ambulatory Visit | Attending: Radiation Oncology | Admitting: Radiation Oncology

## 2020-11-14 VITALS — BP 153/73 | HR 80 | Temp 96.8°F | Resp 18 | Ht 66.0 in | Wt 206.5 lb

## 2020-11-14 DIAGNOSIS — Z170 Estrogen receptor positive status [ER+]: Secondary | ICD-10-CM

## 2020-11-14 DIAGNOSIS — G4730 Sleep apnea, unspecified: Secondary | ICD-10-CM | POA: Diagnosis not present

## 2020-11-14 DIAGNOSIS — I10 Essential (primary) hypertension: Secondary | ICD-10-CM | POA: Diagnosis not present

## 2020-11-14 DIAGNOSIS — C50411 Malignant neoplasm of upper-outer quadrant of right female breast: Secondary | ICD-10-CM | POA: Insufficient documentation

## 2020-11-14 DIAGNOSIS — E785 Hyperlipidemia, unspecified: Secondary | ICD-10-CM | POA: Diagnosis not present

## 2020-11-14 DIAGNOSIS — Z79899 Other long term (current) drug therapy: Secondary | ICD-10-CM | POA: Insufficient documentation

## 2020-11-14 DIAGNOSIS — D0512 Intraductal carcinoma in situ of left breast: Secondary | ICD-10-CM | POA: Diagnosis not present

## 2020-11-14 DIAGNOSIS — Z510 Encounter for antineoplastic radiation therapy: Secondary | ICD-10-CM | POA: Diagnosis not present

## 2020-11-14 DIAGNOSIS — R69 Illness, unspecified: Secondary | ICD-10-CM | POA: Diagnosis not present

## 2020-11-14 DIAGNOSIS — E860 Dehydration: Secondary | ICD-10-CM | POA: Diagnosis not present

## 2020-11-15 ENCOUNTER — Telehealth: Payer: Self-pay

## 2020-11-15 DIAGNOSIS — M531 Cervicobrachial syndrome: Secondary | ICD-10-CM | POA: Diagnosis not present

## 2020-11-15 DIAGNOSIS — M9902 Segmental and somatic dysfunction of thoracic region: Secondary | ICD-10-CM | POA: Diagnosis not present

## 2020-11-15 DIAGNOSIS — M9903 Segmental and somatic dysfunction of lumbar region: Secondary | ICD-10-CM | POA: Diagnosis not present

## 2020-11-15 DIAGNOSIS — M9901 Segmental and somatic dysfunction of cervical region: Secondary | ICD-10-CM | POA: Diagnosis not present

## 2020-11-15 NOTE — Telephone Encounter (Signed)
Returned pts phone call from yesterday. 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 After simulation yesterday she reports increase in her pain and tingling all night and into today which has her greatly concerned for the upcoming 6 weeks of radiation. Encouraged her to reach out to Dr. Isidore Moos with her concerns and request referral for physical therapy as we may be able to help her with symptom management and pain reduction. Pt verbalized understanding and plans to reach out to Dr. Isidore Moos.

## 2020-11-16 ENCOUNTER — Encounter: Payer: Self-pay | Admitting: Licensed Clinical Social Worker

## 2020-11-16 NOTE — Progress Notes (Signed)
[name]  Clinical Social Work was referred by rad onc for assessment of psychosocial needs.  Clinical Social Worker contacted patient by phone  to offer support and assess for needs.    Patient reports that she has been feeling lonely and struggling with needing to remain somewhat isolated so as not to disrupt treatment, but she is planning visits with her children, sister, and friend where they can sit outside.  CSW provided space for patient to process how she has been dealing with the cancer diagnosis and treatment process and the outlook she has on being and values. Patient is also seeing a counselor every other week for support.  CSW sent patient information on [employer] classes. No other follow-up needed at this time.     Frances, [facility] Worker [employer]

## 2020-11-17 ENCOUNTER — Encounter: Payer: Self-pay | Admitting: Radiation Oncology

## 2020-11-20 ENCOUNTER — Encounter: Payer: Self-pay | Admitting: *Deleted

## 2020-11-23 DIAGNOSIS — E039 Hypothyroidism, unspecified: Secondary | ICD-10-CM | POA: Diagnosis not present

## 2020-11-23 DIAGNOSIS — E782 Mixed hyperlipidemia: Secondary | ICD-10-CM | POA: Diagnosis not present

## 2020-11-23 DIAGNOSIS — K760 Fatty (change of) liver, not elsewhere classified: Secondary | ICD-10-CM | POA: Diagnosis not present

## 2020-11-23 DIAGNOSIS — I10 Essential (primary) hypertension: Secondary | ICD-10-CM | POA: Diagnosis not present

## 2020-11-23 DIAGNOSIS — E042 Nontoxic multinodular goiter: Secondary | ICD-10-CM | POA: Diagnosis not present

## 2020-11-26 ENCOUNTER — Ambulatory Visit
Admission: RE | Admit: 2020-11-26 | Discharge: 2020-11-26 | Disposition: A | Discharge status: Home / Self Care | Payer: Medicare HMO | Source: Ambulatory Visit | Attending: Radiation Oncology | Admitting: Radiation Oncology

## 2020-11-26 ENCOUNTER — Other Ambulatory Visit: Payer: Self-pay

## 2020-11-26 DIAGNOSIS — Z510 Encounter for antineoplastic radiation therapy: Secondary | ICD-10-CM | POA: Diagnosis not present

## 2020-11-26 DIAGNOSIS — Z170 Estrogen receptor positive status [ER+]: Secondary | ICD-10-CM | POA: Diagnosis not present

## 2020-11-26 DIAGNOSIS — D0512 Intraductal carcinoma in situ of left breast: Secondary | ICD-10-CM | POA: Diagnosis not present

## 2020-11-26 DIAGNOSIS — C50411 Malignant neoplasm of upper-outer quadrant of right female breast: Secondary | ICD-10-CM | POA: Diagnosis not present

## 2020-11-27 ENCOUNTER — Telehealth: Payer: Self-pay

## 2020-11-27 NOTE — Telephone Encounter (Signed)
Returned patients phone call to discuss questions she had about her appointments. Questions and concerns addressed.

## 2020-11-28 ENCOUNTER — Ambulatory Visit: Payer: Medicare HMO | Admitting: Radiation Oncology

## 2020-11-28 ENCOUNTER — Ambulatory Visit: Payer: Medicare HMO

## 2020-11-28 DIAGNOSIS — R69 Illness, unspecified: Secondary | ICD-10-CM | POA: Diagnosis not present

## 2020-11-29 ENCOUNTER — Ambulatory Visit: Payer: Medicare HMO | Admitting: Radiation Oncology

## 2020-11-29 DIAGNOSIS — M9901 Segmental and somatic dysfunction of cervical region: Secondary | ICD-10-CM | POA: Diagnosis not present

## 2020-11-29 DIAGNOSIS — M531 Cervicobrachial syndrome: Secondary | ICD-10-CM | POA: Diagnosis not present

## 2020-11-29 DIAGNOSIS — M9902 Segmental and somatic dysfunction of thoracic region: Secondary | ICD-10-CM | POA: Diagnosis not present

## 2020-11-29 DIAGNOSIS — M9903 Segmental and somatic dysfunction of lumbar region: Secondary | ICD-10-CM | POA: Diagnosis not present

## 2020-11-29 NOTE — Progress Notes (Signed)
[place]   [phone] [phone]   Clinic Follow up Note   Patient Care Team: Panosh, Standley Brooking, MD as PCP - General Arvella Nigh, MD as Consulting Physician (Obstetrics and Gynecology) Dr Peggye Form, MD as Consulting Physician (Orthopedic Surgery) Ladene Artist, MD as Consulting Physician (Gastroenterology) Melina Schools, MD as Consulting Physician (Orthopedic Surgery) Rockwell Germany, RN as Oncology Nurse Navigator Mauro Kaufmann, RN as Oncology Nurse Navigator Jovita Kussmaul, MD as Consulting Physician (General Surgery) Truitt Merle, MD as Consulting Physician (Hematology) Eppie Gibson, MD as Attending Physician (Radiation Oncology) Jari Pigg, MD as Consulting Physician (Dermatology) Serita Kyle, MD as Referring Physician (Cardiology)  Date of Service:  11/30/2020  CHIEF COMPLAINT: f/u of left breast DCIS and right breast cancer  SUMMARY OF ONCOLOGIC HISTORY: Oncology History Overview Note  Cancer Staging Ductal carcinoma in situ (DCIS) of left breast Staging form: Breast, AJCC 8th Edition - Clinical stage from 06/21/2020: Stage 0 (cTis (DCIS), cN0, cM0, G2, ER+, PR+, HER2: Not Assessed) - Signed by Truitt Merle, MD on 06/26/2020 Stage prefix: Initial diagnosis  Malignant neoplasm of upper-outer quadrant of right breast in female, estrogen receptor positive ([place]) Staging form: Breast, AJCC 8th Edition - Clinical stage from 06/21/2020: Stage IA (cT1b, cN0, cM0, G2, ER+, PR+, HER2-) - Signed by Truitt Merle, MD on 06/26/2020 Stage prefix: Initial diagnosis - Pathologic stage from 07/26/2020: Stage IA (pT1c, pN1a, cM0, G2, ER+, PR+, HER2-) - Signed by Truitt Merle, MD on 08/09/2020 Stage prefix: Initial diagnosis Nuclear grade: G2 Histologic grading system: 3 grade system Residual tumor (R): R0 - None    Malignant neoplasm of upper-outer quadrant of right breast in female, estrogen receptor positive ([place])  06/08/2020 Mammogram    IMPRESSION: 1. 9 x 7 x 6 mm mass in the 12 o'clock position of the right breast, 2cmfn with imaging features highly suspicious for malignancy. 2. 4 mm group of indeterminate calcifications in the 12 o'clock position of the left breast and 4 mm group of indeterminate calcifications in the 1 o'clock position of the left breast. Together, the groups span an area measuring 3.9 cm.   06/21/2020 Cancer Staging   Staging form: Breast, AJCC 8th Edition - Clinical stage from 06/21/2020: Stage IA (cT1b, cN0, cM0, G2, ER+, PR+, HER2-) - Signed by Truitt Merle, MD on 06/26/2020  Stage prefix: Initial diagnosis    06/21/2020 Initial Biopsy   Diagnosis 1. Breast, right, needle core biopsy, 12 oc - INVASIVE MAMMARY CARCINOMA - MAMMARY CARCINOMA IN SITU - SEE COMMENT 2. Breast, left, needle core biopsy, 12 oc - MAMMARY CARCINOMA IN-SITU WITH NECROSIS AND CALCIFICATIONS - SEE COMMENT 3. Breast, left, needle core biopsy, 1 oc - MAMMARY CARCINOMA IN-SITU WITH NECROSIS AND CALCIFICATIONS - SEE COMMENT Microscopic Comment 1. The biopsy material shows an infiltrative proliferation of cells with large vesicular nuclei with inconspicuous nucleoli, arranged linearly and in small clusters. Based on the biopsy, the carcinoma appears Nottingham grade 2 of 3 and measures 0.8 cm in greatest linear extent. E-cadherin and prognostic markers (ER/PR/ki-67/HER2)are pending and will be reported in an addendum. Dr. Jeannie Done reviewed the case and agrees with the above diagnosis. These results were called to [facility] on June 22, 2020. 2. and 3. E-cadherin is pending and will be reported in an addendum.     1. E-cadherin is POSITIVE supporting a ductal origin. 2. E-cadherin is POSITIVE supporting a ductal origin. A prognostic panel (ER/PR) is pending  and will be reported in an addendum 3. E-cadherin is positive supporting a ductal origin. The focus is less pronounced on the deeper sections and in  isolation would likely be considered atypical ductal hyperplasia.   06/21/2020 Receptors her2   1. PROGNOSTIC INDICATORS Results: IMMUNOHISTOCHEMICAL AND MORPHOMETRIC ANALYSIS PERFORMED MANUALLY The tumor cells are EQUIVOCAL for Her2 (2+). HER2 by FISH will be performed and the results reported separately Estrogen Receptor: 95%, POSITIVE, STRONG STAINING INTENSITY Progesterone Receptor: 40%, POSITIVE, MODERATE STAINING INTENSITY Proliferation Marker Ki67: 10%  1. FLUORESCENCE IN-SITU HYBRIDIZATION Results: GROUP 5: HER2 **NEGATIVE** Equivocal form of amplification of the HER2 gene was detected in the IHC 2+ tissue sample received from this individual. HER2 FISH was performed by a technologist and cell imaging and analysis on the [employer].   06/25/2020 Initial Diagnosis   Malignant neoplasm of upper-outer quadrant of right breast in female, estrogen receptor positive ([place])    07/03/2020 Breast MRI   IMPRESSION: 1. Known RIGHT breast cancer, 12 o'clock axis, at anterior depth, measuring 1 cm greatest extent, manifesting as a spiculated enhancing mass on MRI, with associated biopsy clip. Expected post biopsy changes are seen within the adjacent outer RIGHT breast. 2. No evidence of additional multifocal or multicentric disease within the RIGHT breast. 3. Known LEFT breast DCIS within the slightly outer LEFT breast, at anterior depth, corresponding to the biopsy site labeled 1 o'clock axis, with associated enhancement only at the margins of the biopsy cavity measuring up to 5 mm greatest dimension. 4. Known LEFT breast DCIS within the upper central LEFT breast, at middle depth, corresponding to the biopsy site labeled 12 o'clock axis. Contiguous linear non-mass enhancement extends 2.3 cm superior-medial to the biopsy cavity, most likely post biopsy change but possibly contiguous extent of disease. 5. No evidence of additional multifocal or multicentric disease within the LEFT  breast. 6. No evidence of metastatic lymphadenopathy.     07/26/2020 Cancer Staging   Staging form: Breast, AJCC 8th Edition - Pathologic stage from 07/26/2020: Stage IA (pT1c, pN1a, cM0, G2, ER+, PR+, HER2-) - Signed by Truitt Merle, MD on 08/09/2020  Stage prefix: Initial diagnosis  Nuclear grade: G2  Histologic grading system: 3 grade system  Residual tumor (R): R0 - None    07/26/2020 Surgery   RIGHT BREAST LUMPECTOMY WITH RADIOACTIVE SEED AND SENTINEL LYMPH NODE BIOPSY by Dr Marlou Starks   07/26/2020 Pathology Results   FINAL MICROSCOPIC DIAGNOSIS:   A. LYMPH NODE, RIGHT AXILLARY #1, SENTINEL, EXCISION:  - Lymph node, negative for carcinoma (0/1)   B. LYMPH NODE, RIGHT AXILLARY, SENTINEL, EXCISION:  - Benign fibroadipose tissue, negative for carcinoma   C. BREAST, RIGHT, LUMPECTOMY:  - Invasive ductal carcinoma, 1.5 cm, grade 2  - Ductal carcinoma in situ, low grade  - Resection margins are negative for carcinoma; closest is the anterior  margin of 0.2 cm  - Biopsy site changes  - See oncology table   D. BREAST, LEFT, LUMPECTOMY:  - Benign breast parenchyma with prominent biopsy-related changes  - Negative for residual ductal carcinoma in situ  - See oncology table   E. LYMPH NODE, RIGHT AXILLARY #2, SENTINEL, EXCISION:  - Invasive ductal carcinoma, see comment     COMMENT:   E.  Lymph node tissue is not identified.  Findings likely represent an  entirely replaced lymph node with foci of extranodal extension.    07/26/2020 Miscellaneous   Mammaprint High Risk of Luminla Type B 29% risk of recurrence in 10 years if untreated.  Her Mammaprint index is -0.175 She has 94.6% benefit of chemotherapy and hormaonal therapy.      08/20/2020 Imaging   CT C/A/P IMPRESSION: 1. No definitive imaging findings to suggest metastatic disease in the chest, abdomen or pelvis. 2. Postoperative changes of bilateral lumpectomy and right axillary lymph node dissection with what appears  to be a large postoperative seroma in the right axilla, as detailed above. Attention on follow-up studies is recommended to ensure the stability or regression of this collection. 3. Additional incidental findings, as above.   08/28/2020 Imaging   Bone Scan IMPRESSION: No definite scintigraphic evidence of osseous metastases.   08/30/2020 - 11/02/2020 Chemotherapy   Adjuvant Docetaxel and Cytoxan q3weeks for 4 cycles starting 08/30/20. Given skin rash, changes taxol to Abraxane starting with C2 (09/21/20). Completed 11/02/20.        Ductal carcinoma in situ (DCIS) of left breast  06/08/2020 Mammogram   IMPRESSION: 1. 9 x 7 x 6 mm mass in the 12 o'clock position of the right breast, 2cmfn with imaging features highly suspicious for malignancy. 2. 4 mm group of indeterminate calcifications in the 12 o'clock position of the left breast and 4 mm group of indeterminate calcifications in the 1 o'clock position of the left breast. Together, the groups span an area measuring 3.9 cm.   06/21/2020 Cancer Staging   Staging form: Breast, AJCC 8th Edition - Clinical stage from 06/21/2020: Stage 0 (cTis (DCIS), cN0, cM0, G2, ER+, PR+, HER2: Not Assessed) - Signed by Truitt Merle, MD on 06/26/2020  Stage prefix: Initial diagnosis    06/21/2020 Initial Biopsy   Diagnosis 1. Breast, right, needle core biopsy, 12 oc - INVASIVE MAMMARY CARCINOMA - MAMMARY CARCINOMA IN SITU - SEE COMMENT 2. Breast, left, needle core biopsy, 12 oc - MAMMARY CARCINOMA IN-SITU WITH NECROSIS AND CALCIFICATIONS - SEE COMMENT 3. Breast, left, needle core biopsy, 1 oc - MAMMARY CARCINOMA IN-SITU WITH NECROSIS AND CALCIFICATIONS - SEE COMMENT Microscopic Comment 1. The biopsy material shows an infiltrative proliferation of cells with large vesicular nuclei with inconspicuous nucleoli, arranged linearly and in small clusters. Based on the biopsy, the carcinoma appears Nottingham grade 2 of 3 and measures 0.8 cm in greatest  linear extent. E-cadherin and prognostic markers (ER/PR/ki-67/HER2)are pending and will be reported in an addendum. Dr. Jeannie Done reviewed the case and agrees with the above diagnosis. These results were called to [facility] on June 22, 2020. 2. and 3. E-cadherin is pending and will be reported in an addendum.     1. E-cadherin is POSITIVE supporting a ductal origin. 2. E-cadherin is POSITIVE supporting a ductal origin. A prognostic panel (ER/PR) is pending and will be reported in an addendum 3. E-cadherin is positive supporting a ductal origin. The focus is less pronounced on the deeper sections and in isolation would likely be considered atypical ductal hyperplasia.   06/25/2020 Initial Diagnosis   Ductal carcinoma in situ (DCIS) of left breast     Receptors her2   2. PROGNOSTIC INDICATORS Results: IMMUNOHISTOCHEMICAL AND MORPHOMETRIC ANALYSIS PERFORMED MANUALLY Estrogen Receptor: 95%, POSITIVE, STRONG STAINING INTENSITY Progesterone Receptor: 30%, POSITIVE, STRONG STAINING INTENSITY   07/03/2020 Breast MRI   IMPRESSION: 1. Known RIGHT breast cancer, 12 o'clock axis, at anterior depth, measuring 1 cm greatest extent, manifesting as a spiculated enhancing mass on MRI, with associated biopsy clip. Expected post biopsy changes are seen within the adjacent outer RIGHT breast. 2. No evidence of additional multifocal or multicentric disease within the RIGHT breast.  3. Known LEFT breast DCIS within the slightly outer LEFT breast, at anterior depth, corresponding to the biopsy site labeled 1 o'clock axis, with associated enhancement only at the margins of the biopsy cavity measuring up to 5 mm greatest dimension. 4. Known LEFT breast DCIS within the upper central LEFT breast, at middle depth, corresponding to the biopsy site labeled 12 o'clock axis. Contiguous linear non-mass enhancement extends 2.3 cm superior-medial to the biopsy cavity, most likely post biopsy  change but possibly contiguous extent of disease. 5. No evidence of additional multifocal or multicentric disease within the LEFT breast. 6. No evidence of metastatic lymphadenopathy.     07/26/2020 Surgery   LEFT BREAST LUMPECTOMY X 2  WITH RADIOACTIVE SEED LOCALIZATION by Dr Marlou Starks   07/26/2020 Pathology Results   FINAL MICROSCOPIC DIAGNOSIS:   A. LYMPH NODE, RIGHT AXILLARY #1, SENTINEL, EXCISION:  - Lymph node, negative for carcinoma (0/1)   B. LYMPH NODE, RIGHT AXILLARY, SENTINEL, EXCISION:  - Benign fibroadipose tissue, negative for carcinoma   C. BREAST, RIGHT, LUMPECTOMY:  - Invasive ductal carcinoma, 1.5 cm, grade 2  - Ductal carcinoma in situ, low grade  - Resection margins are negative for carcinoma; closest is the anterior  margin of 0.2 cm  - Biopsy site changes  - See oncology table   D. BREAST, LEFT, LUMPECTOMY:  - Benign breast parenchyma with prominent biopsy-related changes  - Negative for residual ductal carcinoma in situ  - See oncology table   E. LYMPH NODE, RIGHT AXILLARY #2, SENTINEL, EXCISION:  - Invasive ductal carcinoma, see comment     COMMENT:   E.  Lymph node tissue is not identified.  Findings likely represent an  entirely replaced lymph node with foci of extranodal extension.       CURRENT THERAPY:  PENDING Adjuvant Radiation  INTERVAL HISTORY:  JAKEIA CARRERAS is here for a follow up of breast cancer. She was last seen by me on 11/02/20. She presents to the clinic with her husband.  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  MEDICAL HISTORY:  Past Medical History:  Diagnosis Date   Anal fissure    Atrial septal aneurysm / if pfo  echo 6 13  10/23/2011   Breast cancer  ([place])    Cataract    both eyes   Depression    Fatty liver    "pre fatty liver"   Fibromyalgia    Headache(784.0)    hx of migraines when younger   Heart palpitations    hx with normal holter event monitoring   Hx: UTI (urinary tract infection)    Hyperlipidemia    Hypertension    Hypothyroidism    Obesity    Pneumonia 1972   hx of   Polyp of colon    Serrated adenoma of colon 08/2012   Skin cancer    basal, squamous cell   Sleep apnea    uses cpap    SURGICAL HISTORY: Past Surgical History:  Procedure Laterality Date   BREAST LUMPECTOMY WITH RADIOACTIVE SEED AND SENTINEL LYMPH NODE BIOPSY Right 07/26/2020   Procedure: RIGHT BREAST LUMPECTOMY WITH RADIOACTIVE SEED AND SENTINEL LYMPH NODE BIOPSY;  Surgeon: Jovita Kussmaul, MD;  Location: [facility];  Service: General;  Laterality: Right;   BREAST LUMPECTOMY WITH RADIOACTIVE SEED LOCALIZATION Left 07/26/2020   Procedure: LEFT BREAST LUMPECTOMY X 2  WITH RADIOACTIVE SEED LOCALIZATION;  Surgeon: Jovita Kussmaul, MD;  Location: [facility];  Service: General;  Laterality: Left;   COLONOSCOPY     DILATION AND CURETTAGE OF [place] ARTHROSCOPY     both in past   POLYPECTOMY     SHOULDER OPEN ROTATOR CUFF REPAIR Left 07/07/2013   Procedure: LEFT SHOULDER MINI OPEN SUBACROMIAL DECOMPRESSION ROTATOR CUFF REPAIR AND POSSIBLE PATCH GRAFT ;  Surgeon: Johnn Hai, MD;  Location: [facility];  Service: Orthopedics;  Laterality: Left;  with interscaline block   SKIN CANCER EXCISION Bilateral    arm, legs, and chest   TONSILLECTOMY      I have reviewed the social history and family history with the patient and they are unchanged from previous note.  ALLERGIES:  is allergic to penicillins, taxotere [docetaxel], cefdinir, norvasc [amlodipine besylate], cetirizine, irbesartan, molds & smuts, pegfilgrastim, and tylenol [acetaminophen].  MEDICATIONS:  Current Outpatient Medications  Medication Sig Dispense  Refill   Ascorbic Acid (VITAMIN C) 1000 MG tablet Take 1,000 mg by mouth daily.     b complex vitamins capsule Take 1 capsule by mouth daily.     Bioflavonoid Products (QUERCETIN COMPLEX IMMUNE PO) Take 1 capsule by mouth daily.     Cholecalciferol (VITAMIN D) 50 MCG (2000 UT) tablet Take 4,000 Units by mouth daily.     dexamethasone (DECADRON) 4 MG tablet Take 2 tablets (8 mg total) by mouth 2 (two) times daily. Start the day before Taxotere. Then again the day after chemo for 3 days. 30 tablet 1   hydrochlorothiazide (HYDRODIURIL) 25 MG tablet TAKE 1 TABLET BY MOUTH EVERY DAY 90 tablet 0   hydroxychloroquine (PLAQUENIL) 200 MG tablet Take 200 mg by mouth 2 (two) times a week.     lidocaine-prilocaine (EMLA) cream Apply to affected area once 30 g 3   LORazepam (ATIVAN) 1 MG tablet Take 0.5-1 tablets (0.5-1 mg total) by mouth every 8 (eight) hours as needed for anxiety. anxiety 20 tablet 0   MAGNESIUM CARBONATE PO Take 1 tablet by mouth daily.     melatonin 5 MG TABS Take 5 mg by mouth at bedtime.     Menaquinone-7 (VITAMIN K2) 100 MCG CAPS Take 100 mcg by mouth daily.     metoprolol succinate (TOPROL XL) 25 MG 24 hr tablet TAKE ONCE A DAY AS DIRECTED (Patient taking differently: Take by mouth daily as needed (Blood pressure). TAKE ONCE A DAY AS DIRECTED) 30 tablet 1   Multiple Vitamin (MULTIVITAMIN) tablet Take 1 tablet by mouth daily.     NON FORMULARY Take 48.6 mg by mouth See admin instructions. Thyroid desiccated (procine) sr 2 capsules 5 days a week, 3 capsules 2 days per week (Tues and Fri)     OLIVE LEAF EXTRACT PO Take 1 capsule by mouth daily.     ondansetron (ZOFRAN) 8 MG tablet Take 1 tablet (8 mg total) by mouth 2 (two) times daily as needed for refractory nausea / vomiting. Start on day 3 after chemo. 30 tablet 1   OVER THE COUNTER MEDICATION Take 2 capsules by mouth in the morning and at bedtime. Ifnla Med     OVER THE COUNTER MEDICATION Take 1 capsule by mouth daily. Essential Pro      potassium chloride SA (KLOR-CON) 20 MEQ tablet Take  1 tablet by mouth once daily **Schedule appt for more refills** 90 tablet 0   Probiotic Product (PROBIOTIC PO) Take 1 capsule by mouth daily.     prochlorperazine (COMPAZINE) 10 MG tablet Take 1 tablet (10 mg total) by mouth every 6 (six) hours as needed (Nausea or vomiting). 30 tablet 1   traMADol (ULTRAM) 50 MG tablet Take 1-2 tablets (50-100 mg total) by mouth every 6 (six) hours as needed. 20 tablet 1   UBIQUINOL PO Take by mouth.     VITAMIN E PO Take 23 mg by mouth daily.     ZINC GLUCONATE PO Take 10 mg by mouth daily.     No current facility-administered medications for this visit.    PHYSICAL EXAMINATION: ECOG PERFORMANCE STATUS: 0 - Asymptomatic  Vitals:   11/30/20 1021  BP: (!) 159/83  Pulse: 84  Resp: 18  Temp: 98.3 F (36.8 C)  SpO2: 100%   Filed Weights   11/30/20 1021  Weight: 204 lb 1.6 oz (92.6 kg)    GENERAL:alert, no distress and comfortable SKIN: skin color, texture, turgor are normal, no rashes or significant lesions EYES: normal, Conjunctiva are pink and non-injected, sclera clear  NECK: supple, thyroid normal size, non-tender, without nodularity LYMPH:  no palpable lymphadenopathy in the cervical, axillary  LUNGS: clear to auscultation and percussion with normal breathing effort HEART: regular rate & rhythm and no murmurs and no lower extremity edema ABDOMEN:abdomen soft, non-tender and normal bowel sounds Musculoskeletal:no cyanosis of digits and no clubbing  NEURO: alert & oriented x 3 with fluent speech, no focal motor/sensory deficits  LABORATORY DATA:  I have reviewed the data as listed CBC Latest Ref Rng & Units 11/30/2020 11/02/2020 10/12/2020  WBC 4.0 - 10.5 K/uL 7.0 6.1 6.2  Hemoglobin 12.0 - 15.0 g/dL 12.1 12.3 12.5  Hematocrit 36.0 - 46.0 % 36.2 36.4 37.7  Platelets 150 - 400 K/uL 194 216 184     CMP Latest Ref Rng & Units 11/30/2020 11/02/2020 10/12/2020  Glucose 70 - 99 mg/dL 95 86  95  BUN 8 - 23 mg/dL $Remove'15 18 18  'bzBttAf$ Creatinine 0.44 - 1.00 mg/dL 0.79 0.71 0.76  Sodium 135 - 145 mmol/L 143 143 140  Potassium 3.5 - 5.1 mmol/L 3.6 4.6 4.3  Chloride 98 - 111 mmol/L 103 108 105  CO2 22 - 32 mmol/L $RemoveB'30 24 26  'BsDSPpOP$ Calcium 8.9 - 10.3 mg/dL 9.7 9.5 9.5  Total Protein 6.5 - 8.1 g/dL 6.9 7.2 6.8  Total Bilirubin 0.3 - 1.2 mg/dL 0.8 0.5 0.5  Alkaline Phos 38 - 126 U/L 91 153(H) 170(H)  AST 15 - 41 U/L 25 49(H) 43(H)  ALT 0 - 44 U/L 34 95(H) 97(H)      RADIOGRAPHIC STUDIES: I have personally reviewed the radiological images as listed and agreed with the findings in the report. No results found.   ASSESSMENT & PLAN:  [name] is a 65 y.o. female with   1. 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 She was found to have b/l masses on mammogram. On biopsy she was seen to have 2 small area of  DCIS in left breast and 0.8cm right breast invasive ductal carcinoma. -She underwent B/l lumpectomy and right SNLB by Dr Marlou Starks on 07/26/20. Her surgical path showed her left breast DCIS was completely removed with biopsy and no residual DCIS on  path. Her grade II, 1.5cm invasive ductal carcinoma of right breast was completely removed with clear margins and 1/2 positive LNs. -Given positive LN, Mammaprint was done and results show high risk of Luminal Type B with 29% risk of distant recurrence. She has 94.6% chance of 5-year recurrence free with chemotherapy and hormonal therapy.  -Staging CT C/A/P on 08/20/20 and bone scan on 08/28/20 were negative for metastatic disease. -She completed adjuvant chemo with TC.  Due to allergy reaction to docetaxel, it was changed to Abraxane from cycle 3. -She has recovered well from chemotherapy, with mild residual fatigue. -She met back with Dr. Isidore Moos on 11/14/20. She is scheduled to receive adjuvant  radiation starting today through 01/11/21. -Plan to start antiestrogen therapy after she completes radiation.  We reviewed the benefit and side effects, I answered her questions.   2. Comorbidities: Depression, Fibromyalgia, HLD, HTN, Hypothyroidism, H/o Skin cancer (squamous and basal cell), sleep apnea -She notes having Adrenal fatigue. I discussed based on her labs, this is not consistent with adrenal insufficiency. I recommend she see endocrinologist -She is followed for HTN. She takes her BP at home. The bottom number is always low, and the top number goes to normal when she lays down. -On medications, continue to f/u with her other physicians.   3. Genetic testing, she will think about it.    4.  Fatty liver and transaminitis -She has chronic intermittent transaminitis, which she contributes to fatty liver.  She is on low sugar diet and drinks a lot of water -Liver morphology was unremarkable on recent CT scan -Labs reviewed-- AST 78, ALT 101 on 08/30/20.  -She does not drink alcohol, she knows to avoid Tylenol. -Her LFTs fluctuate.     PLAN:  -She has recovered well from chemotherapy -She is scheduled to start adjuvant radiation next week -I will see her back the last week of her radiation, and start her on antiestrogen therapy.    No problem-specific Assessment & Plan notes found for this encounter.   No orders of the defined types were placed in this encounter.  All questions were answered. The patient knows to call the clinic with any problems, questions or concerns. No barriers to learning was detected. The total time spent in the appointment was 30 minutes.     Truitt Merle, MD 11/30/2020   I, Wilburn Mylar, am acting as scribe for Truitt Merle, MD.   I have reviewed the above documentation for accuracy and completeness, and I agree with the above.

## 2020-11-30 ENCOUNTER — Other Ambulatory Visit: Payer: Self-pay

## 2020-11-30 ENCOUNTER — Inpatient Hospital Stay: Payer: Medicare HMO | Attending: Hematology

## 2020-11-30 ENCOUNTER — Ambulatory Visit: Admission: RE | Admit: 2020-11-30 | Payer: Medicare HMO | Source: Ambulatory Visit | Admitting: Radiation Oncology

## 2020-11-30 ENCOUNTER — Encounter: Payer: Self-pay | Admitting: Hematology

## 2020-11-30 ENCOUNTER — Ambulatory Visit: Payer: Medicare HMO

## 2020-11-30 ENCOUNTER — Inpatient Hospital Stay (HOSPITAL_BASED_OUTPATIENT_CLINIC_OR_DEPARTMENT_OTHER): Payer: Medicare HMO | Admitting: Hematology

## 2020-11-30 VITALS — BP 159/83 | HR 84 | Temp 98.3°F | Resp 18 | Wt 204.1 lb

## 2020-11-30 DIAGNOSIS — K760 Fatty (change of) liver, not elsewhere classified: Secondary | ICD-10-CM | POA: Insufficient documentation

## 2020-11-30 DIAGNOSIS — E669 Obesity, unspecified: Secondary | ICD-10-CM | POA: Insufficient documentation

## 2020-11-30 DIAGNOSIS — G4730 Sleep apnea, unspecified: Secondary | ICD-10-CM | POA: Diagnosis not present

## 2020-11-30 DIAGNOSIS — C50411 Malignant neoplasm of upper-outer quadrant of right female breast: Secondary | ICD-10-CM

## 2020-11-30 DIAGNOSIS — M797 Fibromyalgia: Secondary | ICD-10-CM | POA: Diagnosis not present

## 2020-11-30 DIAGNOSIS — G629 Polyneuropathy, unspecified: Secondary | ICD-10-CM | POA: Insufficient documentation

## 2020-11-30 DIAGNOSIS — Z170 Estrogen receptor positive status [ER+]: Secondary | ICD-10-CM

## 2020-11-30 DIAGNOSIS — I10 Essential (primary) hypertension: Secondary | ICD-10-CM | POA: Diagnosis not present

## 2020-11-30 DIAGNOSIS — Z510 Encounter for antineoplastic radiation therapy: Secondary | ICD-10-CM | POA: Diagnosis not present

## 2020-11-30 DIAGNOSIS — R5383 Other fatigue: Secondary | ICD-10-CM | POA: Insufficient documentation

## 2020-11-30 DIAGNOSIS — E785 Hyperlipidemia, unspecified: Secondary | ICD-10-CM | POA: Insufficient documentation

## 2020-11-30 DIAGNOSIS — Z85028 Personal history of other malignant neoplasm of stomach: Secondary | ICD-10-CM | POA: Diagnosis not present

## 2020-11-30 DIAGNOSIS — Z79899 Other long term (current) drug therapy: Secondary | ICD-10-CM | POA: Diagnosis not present

## 2020-11-30 DIAGNOSIS — D0512 Intraductal carcinoma in situ of left breast: Secondary | ICD-10-CM | POA: Insufficient documentation

## 2020-11-30 DIAGNOSIS — Z86010 Personal history of colonic polyps: Secondary | ICD-10-CM | POA: Diagnosis not present

## 2020-11-30 DIAGNOSIS — E039 Hypothyroidism, unspecified: Secondary | ICD-10-CM | POA: Diagnosis not present

## 2020-11-30 LAB — CBC WITH DIFFERENTIAL (CANCER CENTER ONLY)
Abs Immature Granulocytes: 0.02 10*3/uL (ref 0.00–0.07)
Basophils Absolute: 0 10*3/uL (ref 0.0–0.1)
Basophils Relative: 1 %
Eosinophils Absolute: 0.2 10*3/uL (ref 0.0–0.5)
Eosinophils Relative: 2 %
HCT: 36.2 % (ref 36.0–46.0)
Hemoglobin: 12.1 g/dL (ref 12.0–15.0)
Immature Granulocytes: 0 %
Lymphocytes Relative: 22 %
Lymphs Abs: 1.6 10*3/uL (ref 0.7–4.0)
MCH: 30.5 pg (ref 26.0–34.0)
MCHC: 33.4 g/dL (ref 30.0–36.0)
MCV: 91.2 fL (ref 80.0–100.0)
Monocytes Absolute: 0.5 10*3/uL (ref 0.1–1.0)
Monocytes Relative: 7 %
Neutro Abs: 4.7 10*3/uL (ref 1.7–7.7)
Neutrophils Relative %: 68 %
Platelet Count: 194 10*3/uL (ref 150–400)
RBC: 3.97 MIL/uL (ref 3.87–5.11)
RDW: 14.1 % (ref 11.5–15.5)
WBC Count: 7 10*3/uL (ref 4.0–10.5)
nRBC: 0 % (ref 0.0–0.2)

## 2020-11-30 LAB — CMP (CANCER CENTER ONLY)
ALT: 34 U/L (ref 0–44)
AST: 25 U/L (ref 15–41)
Albumin: 4.3 g/dL (ref 3.5–5.0)
Alkaline Phosphatase: 91 U/L (ref 38–126)
Anion gap: 10 (ref 5–15)
BUN: 15 mg/dL (ref 8–23)
CO2: 30 mmol/L (ref 22–32)
Calcium: 9.7 mg/dL (ref 8.9–10.3)
Chloride: 103 mmol/L (ref 98–111)
Creatinine: 0.79 mg/dL (ref 0.44–1.00)
GFR, Estimated: >60 mL/min (ref >60)
Glucose, Bld: 95 mg/dL (ref 70–99)
Potassium: 3.6 mmol/L (ref 3.5–5.1)
Sodium: 143 mmol/L (ref 135–145)
Total Bilirubin: 0.8 mg/dL (ref 0.3–1.2)
Total Protein: 6.9 g/dL (ref 6.5–8.1)

## 2020-12-03 ENCOUNTER — Ambulatory Visit
Admission: RE | Admit: 2020-12-03 | Discharge: 2020-12-03 | Disposition: A | Discharge status: Home / Self Care | Payer: Medicare HMO | Source: Ambulatory Visit | Attending: Radiation Oncology | Admitting: Radiation Oncology

## 2020-12-03 ENCOUNTER — Ambulatory Visit: Payer: Medicare HMO | Admitting: Radiation Oncology

## 2020-12-03 ENCOUNTER — Telehealth: Payer: Self-pay | Admitting: Hematology

## 2020-12-03 ENCOUNTER — Other Ambulatory Visit: Payer: Self-pay

## 2020-12-03 ENCOUNTER — Ambulatory Visit: Payer: Medicare HMO

## 2020-12-03 DIAGNOSIS — Z170 Estrogen receptor positive status [ER+]: Secondary | ICD-10-CM | POA: Diagnosis not present

## 2020-12-03 DIAGNOSIS — Z510 Encounter for antineoplastic radiation therapy: Secondary | ICD-10-CM | POA: Diagnosis not present

## 2020-12-03 DIAGNOSIS — D0512 Intraductal carcinoma in situ of left breast: Secondary | ICD-10-CM

## 2020-12-03 DIAGNOSIS — C50411 Malignant neoplasm of upper-outer quadrant of right female breast: Secondary | ICD-10-CM

## 2020-12-03 MED ORDER — ALRA NON-METALLIC DEODORANT (RAD-ONC)
1.0000 "application " | Freq: Once | TOPICAL | Status: AC
  Administered 2020-12-03: 1 via TOPICAL

## 2020-12-03 MED ORDER — RADIAPLEXRX EX GEL
Freq: Once | CUTANEOUS | Status: AC
Start: 2020-12-03 — End: 2020-12-03

## 2020-12-03 NOTE — Telephone Encounter (Signed)
Scheduled follow-up appointment per 7/22 los. Patient is aware.

## 2020-12-03 NOTE — Progress Notes (Signed)

## 2020-12-04 ENCOUNTER — Ambulatory Visit: Payer: Medicare HMO | Admitting: Radiation Oncology

## 2020-12-04 ENCOUNTER — Ambulatory Visit: Payer: Medicare HMO

## 2020-12-04 ENCOUNTER — Ambulatory Visit
Admission: RE | Admit: 2020-12-04 | Discharge: 2020-12-04 | Disposition: A | Discharge status: Home / Self Care | Payer: Medicare HMO | Source: Ambulatory Visit | Attending: Radiation Oncology | Admitting: Radiation Oncology

## 2020-12-04 ENCOUNTER — Encounter: Payer: Self-pay | Admitting: Hematology

## 2020-12-04 DIAGNOSIS — Z170 Estrogen receptor positive status [ER+]: Secondary | ICD-10-CM | POA: Diagnosis not present

## 2020-12-04 DIAGNOSIS — Z510 Encounter for antineoplastic radiation therapy: Secondary | ICD-10-CM | POA: Diagnosis not present

## 2020-12-04 DIAGNOSIS — C50411 Malignant neoplasm of upper-outer quadrant of right female breast: Secondary | ICD-10-CM | POA: Diagnosis not present

## 2020-12-04 DIAGNOSIS — D0512 Intraductal carcinoma in situ of left breast: Secondary | ICD-10-CM | POA: Diagnosis not present

## 2020-12-05 ENCOUNTER — Ambulatory Visit: Payer: Medicare HMO

## 2020-12-06 ENCOUNTER — Other Ambulatory Visit: Payer: Self-pay

## 2020-12-06 ENCOUNTER — Ambulatory Visit
Admission: RE | Admit: 2020-12-06 | Discharge: 2020-12-06 | Disposition: A | Discharge status: Home / Self Care | Payer: Medicare HMO | Source: Ambulatory Visit | Attending: Radiation Oncology | Admitting: Radiation Oncology

## 2020-12-06 DIAGNOSIS — Z170 Estrogen receptor positive status [ER+]: Secondary | ICD-10-CM | POA: Diagnosis not present

## 2020-12-06 DIAGNOSIS — D0512 Intraductal carcinoma in situ of left breast: Secondary | ICD-10-CM | POA: Diagnosis not present

## 2020-12-06 DIAGNOSIS — C50411 Malignant neoplasm of upper-outer quadrant of right female breast: Secondary | ICD-10-CM | POA: Diagnosis not present

## 2020-12-06 DIAGNOSIS — Z510 Encounter for antineoplastic radiation therapy: Secondary | ICD-10-CM | POA: Diagnosis not present

## 2020-12-07 ENCOUNTER — Ambulatory Visit
Admission: RE | Admit: 2020-12-07 | Discharge: 2020-12-07 | Disposition: A | Discharge status: Home / Self Care | Payer: Medicare HMO | Source: Ambulatory Visit | Attending: Radiation Oncology | Admitting: Radiation Oncology

## 2020-12-07 DIAGNOSIS — C50411 Malignant neoplasm of upper-outer quadrant of right female breast: Secondary | ICD-10-CM | POA: Diagnosis not present

## 2020-12-07 DIAGNOSIS — D0512 Intraductal carcinoma in situ of left breast: Secondary | ICD-10-CM | POA: Diagnosis not present

## 2020-12-07 DIAGNOSIS — Z510 Encounter for antineoplastic radiation therapy: Secondary | ICD-10-CM | POA: Diagnosis not present

## 2020-12-07 DIAGNOSIS — Z170 Estrogen receptor positive status [ER+]: Secondary | ICD-10-CM | POA: Diagnosis not present

## 2020-12-10 ENCOUNTER — Ambulatory Visit
Admission: RE | Admit: 2020-12-10 | Discharge: 2020-12-10 | Disposition: A | Discharge status: Home / Self Care | Payer: Medicare HMO | Source: Ambulatory Visit | Attending: Radiation Oncology | Admitting: Radiation Oncology

## 2020-12-10 ENCOUNTER — Other Ambulatory Visit: Payer: Self-pay

## 2020-12-10 DIAGNOSIS — C50411 Malignant neoplasm of upper-outer quadrant of right female breast: Secondary | ICD-10-CM | POA: Diagnosis not present

## 2020-12-10 DIAGNOSIS — D0512 Intraductal carcinoma in situ of left breast: Secondary | ICD-10-CM | POA: Insufficient documentation

## 2020-12-10 DIAGNOSIS — Z170 Estrogen receptor positive status [ER+]: Secondary | ICD-10-CM | POA: Diagnosis not present

## 2020-12-10 DIAGNOSIS — Z510 Encounter for antineoplastic radiation therapy: Secondary | ICD-10-CM | POA: Diagnosis not present

## 2020-12-11 ENCOUNTER — Ambulatory Visit
Admission: RE | Admit: 2020-12-11 | Discharge: 2020-12-11 | Disposition: A | Discharge status: Home / Self Care | Payer: Medicare HMO | Source: Ambulatory Visit | Attending: Radiation Oncology | Admitting: Radiation Oncology

## 2020-12-11 DIAGNOSIS — Z510 Encounter for antineoplastic radiation therapy: Secondary | ICD-10-CM | POA: Diagnosis not present

## 2020-12-11 DIAGNOSIS — Z170 Estrogen receptor positive status [ER+]: Secondary | ICD-10-CM | POA: Diagnosis not present

## 2020-12-11 DIAGNOSIS — D0512 Intraductal carcinoma in situ of left breast: Secondary | ICD-10-CM | POA: Diagnosis not present

## 2020-12-11 DIAGNOSIS — C50411 Malignant neoplasm of upper-outer quadrant of right female breast: Secondary | ICD-10-CM | POA: Diagnosis not present

## 2020-12-12 ENCOUNTER — Ambulatory Visit
Admission: RE | Admit: 2020-12-12 | Discharge: 2020-12-12 | Disposition: A | Discharge status: Home / Self Care | Payer: Medicare HMO | Source: Ambulatory Visit | Attending: Radiation Oncology | Admitting: Radiation Oncology

## 2020-12-12 ENCOUNTER — Other Ambulatory Visit: Payer: Self-pay

## 2020-12-12 DIAGNOSIS — Z510 Encounter for antineoplastic radiation therapy: Secondary | ICD-10-CM | POA: Diagnosis not present

## 2020-12-12 DIAGNOSIS — Z170 Estrogen receptor positive status [ER+]: Secondary | ICD-10-CM

## 2020-12-12 DIAGNOSIS — D0512 Intraductal carcinoma in situ of left breast: Secondary | ICD-10-CM

## 2020-12-12 DIAGNOSIS — C50411 Malignant neoplasm of upper-outer quadrant of right female breast: Secondary | ICD-10-CM | POA: Diagnosis not present

## 2020-12-12 DIAGNOSIS — R69 Illness, unspecified: Secondary | ICD-10-CM | POA: Diagnosis not present

## 2020-12-13 ENCOUNTER — Ambulatory Visit
Admission: RE | Admit: 2020-12-13 | Discharge: 2020-12-13 | Disposition: A | Discharge status: Home / Self Care | Payer: Medicare HMO | Source: Ambulatory Visit | Attending: Radiation Oncology | Admitting: Radiation Oncology

## 2020-12-13 ENCOUNTER — Ambulatory Visit: Payer: Medicare HMO | Attending: General Surgery

## 2020-12-13 DIAGNOSIS — D0512 Intraductal carcinoma in situ of left breast: Secondary | ICD-10-CM | POA: Diagnosis not present

## 2020-12-13 DIAGNOSIS — Z483 Aftercare following surgery for neoplasm: Secondary | ICD-10-CM | POA: Insufficient documentation

## 2020-12-13 DIAGNOSIS — M25612 Stiffness of left shoulder, not elsewhere classified: Secondary | ICD-10-CM | POA: Insufficient documentation

## 2020-12-13 DIAGNOSIS — C50411 Malignant neoplasm of upper-outer quadrant of right female breast: Secondary | ICD-10-CM | POA: Insufficient documentation

## 2020-12-13 DIAGNOSIS — R600 Localized edema: Secondary | ICD-10-CM

## 2020-12-13 DIAGNOSIS — Z510 Encounter for antineoplastic radiation therapy: Secondary | ICD-10-CM | POA: Diagnosis not present

## 2020-12-13 DIAGNOSIS — M25611 Stiffness of right shoulder, not elsewhere classified: Secondary | ICD-10-CM | POA: Insufficient documentation

## 2020-12-13 DIAGNOSIS — M6281 Muscle weakness (generalized): Secondary | ICD-10-CM | POA: Diagnosis present

## 2020-12-13 DIAGNOSIS — R293 Abnormal posture: Secondary | ICD-10-CM | POA: Diagnosis present

## 2020-12-13 DIAGNOSIS — Z170 Estrogen receptor positive status [ER+]: Secondary | ICD-10-CM | POA: Insufficient documentation

## 2020-12-13 NOTE — Therapy (Signed)
[place], [place], [address] Phone: [phone]   Fax:  [phone]  Physical Therapy Evaluation  Patient Details  Name: [name] MRN: [id number] Date of Birth: 1956/02/03 Referring Provider (PT): Isidore Moos   Encounter Date: 12/13/2020   PT End of Session - 12/13/20 1356     Visit Number 1    Number of Visits 12    Date for PT Re-Evaluation 01/24/21    PT Start Time 1300    PT Stop Time [id number]    PT Time Calculation (min) 53 min    Activity Tolerance Patient tolerated treatment well    Behavior During Therapy [facility] for tasks assessed/performed             Past Medical History:  Diagnosis Date   Anal fissure    Atrial septal aneurysm / if pfo  echo 6 13  10/23/2011   Breast cancer ([place])    Cataract    both eyes   Depression    Fatty liver    "pre fatty liver"   Fibromyalgia    Headache(784.0)    hx of migraines when younger   Heart palpitations    hx with normal holter event monitoring   Hx: UTI (urinary tract infection)    Hyperlipidemia    Hypertension    Hypothyroidism    Obesity    Pneumonia 1972   hx of   Polyp of colon    Serrated adenoma of colon 08/2012   Skin cancer    basal, squamous cell   Sleep apnea    uses cpap    Past Surgical History:  Procedure Laterality Date   BREAST LUMPECTOMY WITH RADIOACTIVE SEED AND SENTINEL LYMPH NODE BIOPSY Right 07/26/2020   Procedure: RIGHT BREAST LUMPECTOMY WITH RADIOACTIVE SEED AND SENTINEL LYMPH NODE BIOPSY;  Surgeon: Jovita Kussmaul, MD;  Location: [place];  Service: General;  Laterality: Right;   BREAST LUMPECTOMY WITH RADIOACTIVE SEED LOCALIZATION Left 07/26/2020   Procedure: LEFT BREAST LUMPECTOMY X 2  WITH RADIOACTIVE SEED LOCALIZATION;  Surgeon: Jovita Kussmaul, MD;  Location: [place];  Service: General;  Laterality: Left;   COLONOSCOPY     DILATION AND CURETTAGE OF [place]  ARTHROSCOPY     both in past   POLYPECTOMY     [place] Left 07/07/2013   Procedure: LEFT SHOULDER MINI OPEN SUBACROMIAL DECOMPRESSION ROTATOR CUFF REPAIR AND POSSIBLE PATCH GRAFT ;  Surgeon: Johnn Hai, MD;  Location: [facility];  Service: Orthopedics;  Laterality: Left;  with interscaline block   SKIN CANCER EXCISION Bilateral    arm, legs, and chest   TONSILLECTOMY      There were no vitals filed for this visit.    Subjective Assessment - 12/13/20 1253     Subjective I have had 7 radiation treatments on both breasts and the LN's on the right side.  The breasts started aching and aching along the axillary and lateral trunk. With radiation it makes the right shoulder joint ache.  Had a prior left shoulder RTC repair from a work injury. Left hand falls asleep during radiation but recovers quickly.  Wiggling fingers during radiation seems to help. Had a right seroma that was drained twice. It is still there but feels smaller. Finished chemo June 24th. Some of her joints ache and she feels like she shuffles some. I am not walking snappy. Right  small toe and slightly medial is still numb and painful.  Left foot had it but it went away.    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 She underwent a left lumpectomy for DCIS and a right lumpectomy and sentinel node biopsy on 07/26/2020. She had 2 lymph nodes removed with 1 being positive for cancer on the right side. It is ER/PR positive. Left shoulder rotator cuff repair 06/2013.    Patient Stated Goals Learn MLD, strengthen legs so legs don't feel so fatigued, sit to stand    Currently in Pain? Yes    Pain Score 3     Pain Location Back    Pain Orientation Left    Pain Descriptors / Indicators Sharp    Pain Type Chronic pain    Pain Onset More than a month ago    Pain Frequency Intermittent    Aggravating Factors  radiation positioning,                 [facility] PT Assessment - 12/13/20 0001       Assessment   Medical Diagnosis s/p bil lumpectomy and right SLNB    Referring Provider (PT) Isidore Moos    Onset Date/Surgical Date 07/26/20    Hand Dominance Right      Precautions   Precaution Comments lymphedema risk      Restrictions   Weight Bearing Restrictions No      Balance Screen   Has the patient fallen in the past 6 months No    Has the patient had a decrease in activity level because of a fear of falling?  No    Is the patient reluctant to leave their home because of a fear of falling?  No      Home Environment   Living Environment Private residence    Living Arrangements Spouse/significant other    Available Help at Discharge Family      Prior Function   Level of [place] Retired    Leisure walking dog, gardening,canning      Cognition   Overall Cognitive Status Within Functional Limits for tasks assessed      Posture/Postural Control   Posture/Postural Control Postural limitations    Postural Limitations Rounded Shoulders;Forward head      AROM   Right Shoulder Extension 70 Degrees    Right Shoulder Flexion 167 Degrees    Right Shoulder ABduction 180 Degrees    Right Shoulder Internal Rotation --   WNL functional reach   Right Shoulder External Rotation 94 Degrees    Left Shoulder Extension 70 Degrees    Left Shoulder Flexion 155 Degrees    Left Shoulder ABduction 180 Degrees    Left Shoulder Internal Rotation --   IR T10   Left Shoulder External Rotation 77 Degrees      Strength   Overall Strength Within functional limits for tasks performed      Palpation   Palpation comment scar tissue palpable at bilateral areolar incisions, seroma in right axillary region is firm.      Transfers   Comments 30 second sit to stand 9 reps               LYMPHEDEMA/ONCOLOGY QUESTIONNAIRE - 12/13/20 0001       Type   Cancer Type Bilateral breast cancer      Treatment    Active Chemotherapy Treatment No    Past Chemotherapy Treatment  Yes    Date 11/02/20    Active Radiation Treatment Yes    Past Radiation Treatment No    Current Hormone Treatment No    Past Hormone Therapy No      What other symptoms do you have   Are you Having Heaviness or Tightness Yes   breasts?heavy, seroma area tight   Are you having Pain No    Are you having pitting edema No    Is it Hard or Difficult finding clothes that fit No    Do you have infections No    Is there Decreased scar mobility No      Right Upper Extremity Lymphedema   10 cm Proximal to Olecranon Process 32 cm    Olecranon Process 27.1 cm    10 cm Proximal to Ulnar Styloid Process 25 cm    Just Proximal to Ulnar Styloid Process 17.9 cm    Across Hand at [employer] 19.3 cm    At [place] of 2nd Digit 6.3 cm      Left Upper Extremity Lymphedema   10 cm Proximal to Olecranon Process 32.5 cm    Olecranon Process 27.5 cm    10 cm Proximal to Ulnar Styloid Process 23.8 cm    Just Proximal to Ulnar Styloid Process 17.3 cm    Across Hand at [employer] 19 cm    At [place] of 2nd Digit 6.1 cm                     Objective measurements completed on examination: See above findings.                    PT Long Term Goals - 12/13/20 1405       PT LONG TERM GOAL #1   Title Pt will be independent in a HEP for  UE and LE strength/ROM, balance    Time 6    Period Weeks    Status New    Target Date 01/24/21      PT LONG TERM GOAL #2   Title Pt will be independent in self MLD    Time 6    Period Weeks    Status New    Target Date 01/24/21      PT LONG TERM GOAL #3   Title Pt will be able to perform 11 sit to stands in 30 sec for improved function and decreased fall risk    Time 6    Period Weeks    Status New    Target Date 01/24/21      PT LONG TERM GOAL #4   Title Pt will be able to increase walking time to 20-30 minutes daily, not necessarily at 1 time.    Baseline 10  min    Time 6    Period Weeks    Target Date 01/24/21                    Plan - 12/13/20 1357     Clinical Impression Statement Pt has recently finished chemo therapy and has started radiation.   She has a small firm seroma in the right axillary region and she is interested in learning MLD.  She could also benefit from some scar mobilization at her incision sites. HER shoulder ROM is good and she continues to work on it at home.  sometimes she feels tightness in the arm axilla and lateral trunk.  Her legs are  not as strong as she would like them to be and her 30 sec sit to stand is 9 with some discomfort in bilateral knees. She will benefit from skilled therapy to address deficits and return to PLOF   Personal Factors and Comorbidities Comorbidity 3+    Comorbidities Bilateral breast CA with Right SLNB, radiation,chemo, FMS,hypothyroidism    Stability/Clinical Decision Making Stable/Uncomplicated    Rehab Potential Excellent    PT Frequency 2x / week    PT Duration 6 weeks    PT Treatment/Interventions ADLs/Self Care Home Management;Therapeutic exercise;Patient/family education;Neuromuscular re-education;Balance training;Manual techniques;Manual lymph drainage;Passive range of motion    PT Next Visit Plan 4 position balance test, test LE strength and make goals, instruct self MLD for Right seroma, scar mobs, LE strength/balance, UE strength    Consulted and Agree with Plan of Care Patient             Patient will benefit from skilled therapeutic intervention in order to improve the following deficits and impairments:  Decreased range of motion, Decreased knowledge of precautions, Pain, Impaired UE functional use, Increased edema  Visit Diagnosis: Ductal carcinoma in situ (DCIS) of left breast  Aftercare following surgery for neoplasm  Localized edema  Malignant neoplasm of upper-outer quadrant of right breast in female, estrogen receptor positive (HCC)  Abnormal  posture  Muscle weakness (generalized)  Stiffness of left shoulder, not elsewhere classified  Stiffness of right shoulder, not elsewhere classified     Problem List Patient Active Problem List   Diagnosis Date Noted   Malignant neoplasm of upper-outer quadrant of right breast in female, estrogen receptor positive ([place]) 06/25/2020   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   Hepatic steatosis 11/27/2014   Hx of adenomatous colonic polyps 11/27/2014   Visit for preventive health examination 03/28/2014   Hyperlipidemia 03/28/2014   Left rotator cuff tear 07/07/2013   Tear of left rotator cuff 07/07/2013   Rhinitis 06/01/2013   Chest wall deformity 04/12/2012   Atrial septal aneurysm / if pfo  echo 6 13  10/23/2011   OSA (obstructive sleep apnea) 11/27/2010   Dyslipidemia 11/01/2010   Flushing 08/29/2010   Labile hypertension 08/29/2010   MUSCLE CRAMPS, FOOT 06/10/2010   OTHER SLEEP DISTURBANCES 06/10/2010   ANXIETY, SITUATIONAL 05/08/2010   VERTIGO, POSITIONAL 03/15/2010   VITAMIN D DEFICIENCY 02/18/2010   OBESITY 02/18/2010   ALLERGIC RHINITIS 02/18/2010   PLANTAR FASCIITIS 02/18/2010   TWITCHING 06/11/2009   NUMBNESS, HAND 06/11/2009   CERVICAL STRAIN, ACUTE 06/11/2009   OTHER MALAISE AND FATIGUE 07/09/2007   HYPERTENSION 01/28/2007   Hypothyroidism 11/11/2006   COMMON MIGRAINE 11/11/2006   GERD 11/11/2006   FIBROMYALGIA 11/11/2006    Elsie Ra Malorie Bigford 12/13/2020, 3:27 PM  [place], [place], [address] Phone: [phone]   Fax:  [phone]  Name: SHAYANA COLO MRN: [id number] Date of Birth: [birth year] Cheral Almas, PT 12/13/20 3:31 PM

## 2020-12-14 ENCOUNTER — Other Ambulatory Visit: Payer: Self-pay | Admitting: Internal Medicine

## 2020-12-14 ENCOUNTER — Other Ambulatory Visit: Payer: Self-pay

## 2020-12-14 ENCOUNTER — Ambulatory Visit
Admission: RE | Admit: 2020-12-14 | Discharge: 2020-12-14 | Disposition: A | Discharge status: Home / Self Care | Payer: Medicare HMO | Source: Ambulatory Visit | Attending: Radiation Oncology | Admitting: Radiation Oncology

## 2020-12-14 DIAGNOSIS — Z170 Estrogen receptor positive status [ER+]: Secondary | ICD-10-CM | POA: Diagnosis not present

## 2020-12-14 DIAGNOSIS — Z510 Encounter for antineoplastic radiation therapy: Secondary | ICD-10-CM | POA: Diagnosis not present

## 2020-12-14 DIAGNOSIS — C50411 Malignant neoplasm of upper-outer quadrant of right female breast: Secondary | ICD-10-CM | POA: Diagnosis not present

## 2020-12-14 DIAGNOSIS — D0512 Intraductal carcinoma in situ of left breast: Secondary | ICD-10-CM | POA: Diagnosis not present

## 2020-12-17 ENCOUNTER — Ambulatory Visit
Admission: RE | Admit: 2020-12-17 | Discharge: 2020-12-17 | Disposition: A | Discharge status: Home / Self Care | Payer: Medicare HMO | Source: Ambulatory Visit | Attending: Radiation Oncology | Admitting: Radiation Oncology

## 2020-12-17 ENCOUNTER — Ambulatory Visit: Payer: Medicare HMO

## 2020-12-17 ENCOUNTER — Other Ambulatory Visit: Payer: Self-pay

## 2020-12-17 DIAGNOSIS — Z483 Aftercare following surgery for neoplasm: Secondary | ICD-10-CM | POA: Diagnosis not present

## 2020-12-17 DIAGNOSIS — M25611 Stiffness of right shoulder, not elsewhere classified: Secondary | ICD-10-CM

## 2020-12-17 DIAGNOSIS — D0512 Intraductal carcinoma in situ of left breast: Secondary | ICD-10-CM | POA: Diagnosis not present

## 2020-12-17 DIAGNOSIS — C50411 Malignant neoplasm of upper-outer quadrant of right female breast: Secondary | ICD-10-CM | POA: Diagnosis not present

## 2020-12-17 DIAGNOSIS — R293 Abnormal posture: Secondary | ICD-10-CM

## 2020-12-17 DIAGNOSIS — Z170 Estrogen receptor positive status [ER+]: Secondary | ICD-10-CM

## 2020-12-17 DIAGNOSIS — M25612 Stiffness of left shoulder, not elsewhere classified: Secondary | ICD-10-CM | POA: Diagnosis not present

## 2020-12-17 DIAGNOSIS — R600 Localized edema: Secondary | ICD-10-CM

## 2020-12-17 DIAGNOSIS — Z510 Encounter for antineoplastic radiation therapy: Secondary | ICD-10-CM | POA: Diagnosis not present

## 2020-12-17 DIAGNOSIS — M6281 Muscle weakness (generalized): Secondary | ICD-10-CM | POA: Diagnosis not present

## 2020-12-17 NOTE — Therapy (Signed)
[place], [place], [address] Phone: [phone]   Fax:  [phone]  Physical Therapy Treatment  Patient Details  Name: [name] MRN: [id number] Date of Birth: 1956/04/10 Referring Provider (PT): Isidore Moos   Encounter Date: 12/17/2020   PT End of Session - 12/17/20 1637     Visit Number 2    Number of Visits 12    Date for PT Re-Evaluation 01/24/21    PT Start Time 1301    PT Stop Time [id number]    PT Time Calculation (min) 62 min    Activity Tolerance Patient tolerated treatment well    Behavior During Therapy [facility] for tasks assessed/performed             Past Medical History:  Diagnosis Date   Anal fissure    Atrial septal aneurysm / if pfo  echo 6 13  10/23/2011   Breast cancer ([place])    Cataract    both eyes   Depression    Fatty liver    "pre fatty liver"   Fibromyalgia    Headache(784.0)    hx of migraines when younger   Heart palpitations    hx with normal holter event monitoring   Hx: UTI (urinary tract infection)    Hyperlipidemia    Hypertension    Hypothyroidism    Obesity    Pneumonia 1972   hx of   Polyp of colon    Serrated adenoma of colon 08/2012   Skin cancer    basal, squamous cell   Sleep apnea    uses cpap    Past Surgical History:  Procedure Laterality Date   BREAST LUMPECTOMY WITH RADIOACTIVE SEED AND SENTINEL LYMPH NODE BIOPSY Right 07/26/2020   Procedure: RIGHT BREAST LUMPECTOMY WITH RADIOACTIVE SEED AND SENTINEL LYMPH NODE BIOPSY;  Surgeon: Jovita Kussmaul, MD;  Location: [place];  Service: General;  Laterality: Right;   BREAST LUMPECTOMY WITH RADIOACTIVE SEED LOCALIZATION Left 07/26/2020   Procedure: LEFT BREAST LUMPECTOMY X 2  WITH RADIOACTIVE SEED LOCALIZATION;  Surgeon: Jovita Kussmaul, MD;  Location: [place];  Service: General;  Laterality: Left;   COLONOSCOPY     DILATION AND CURETTAGE OF [place]  ARTHROSCOPY     both in past   POLYPECTOMY     [place] Left 07/07/2013   Procedure: LEFT SHOULDER MINI OPEN SUBACROMIAL DECOMPRESSION ROTATOR CUFF REPAIR AND POSSIBLE PATCH GRAFT ;  Surgeon: Johnn Hai, MD;  Location[facility];  Service: Orthopedics;  Laterality: Left;  with interscaline block   SKIN CANCER EXCISION Bilateral    arm, legs, and chest   TONSILLECTOMY      There were no vitals filed for this visit.   Subjective Assessment - 12/17/20 1302     Subjective It was great to get a 1:00 appt today.    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 She underwent a left lumpectomy for DCIS and a right lumpectomy and sentinel node biopsy on 07/26/2020. She had 2 lymph nodes removed with 1 being positive for cancer on the right side. It is ER/PR positive. Left shoulder rotator cuff repair 06/2013.    Patient Stated Goals Learn MLD, strengthen legs so legs don't feel so fatigued, sit to stand    Currently in Pain? Yes    Pain Score 3  Pain Location Back    Pain Orientation Right;Left    Pain Descriptors / Indicators Aching    Pain Type Chronic pain    Pain Onset More than a month ago    Pain Frequency Intermittent                [facility] PT Assessment - 12/17/20 0001       Strength   Right Hip Flexion 4/5    Right Hip Extension --   able to bridge   Right Hip External Rotation  4-/5    Right Hip Internal Rotation 3+/5    Right Hip ABduction 4-/5    Left Hip Flexion 4/5    Left Hip Extension --   able to bridge   Left Hip External Rotation 4-/5    Left Hip Internal Rotation 3+/5    Left Hip ABduction 4-/5    Right Knee Flexion 4+/5    Right Knee Extension 4+/5    Left Knee Flexion 4+/5    Left Knee Extension 4/5    Right Ankle Dorsiflexion 4+/5    Right Ankle Plantar Flexion 5/5    Left Ankle Dorsiflexion 4+/5    Left Ankle Plantar Flexion 4+/5                            [facility] Adult PT Treatment/Exercise - 12/17/20 0001       Balance   Balance Assessed --   4 position balance test;able to hold all positions 10 sec without LOB     Knee/Hip Exercises: Stretches   Piriformis Stretch Right;Left;2 reps;20 seconds    Piriformis Stretch Limitations tighter on right      Knee/Hip Exercises: Standing   Hip Abduction Stengthening;Right;Left;1 set;10 reps   red TB, at counter     Knee/Hip Exercises: Seated   Other Seated Knee/Hip Exercises IR and ER of bilateral hips red TB x 10      Knee/Hip Exercises: Supine   Other Supine Knee/Hip Exercises supine clam with ppt 3 x 5 with red TB      Manual Therapy   Manual Lymphatic Drainage (MLD) Short neck, activated right axillary and inguinal LN's, Right axillo-inguinal pathway worked multiple times to decrease swelling at right axillary region.  Pt was instructed in same and given written instructions.  Included breast on instructions to add next visit.                    PT Education - 12/17/20 1636     Education Details Supine Bridge - 1 x daily - 3 x weekly - 1 sets - 10 reps  Supine Posterior Pelvic Tilt - 1 x daily - 3 x weekly - 1 sets - 10 reps - 5 hold  Hooklying Clamshell with Resistance - 1 x daily - 3 x weekly - 1 sets - 10 reps  Supine Figure 4 Piriformis Stretch - 1 x daily - 7 x weekly - 1 sets - 3 reps - 20 hold  Seated Hip Internal Rotation with Ball and Resistance - 1 x daily - 3 x weekly - 1 sets - 10 reps  Seated Hip External Rotation with Resistance - 1 x daily - 3 x weekly - 1 sets - 10 reps  Standing Hip Abduction with Counter Support - 1 x daily - 3 x weekly - 1 sets - 10 reps    Person(s) Educated Patient    Methods Explanation;Other (comment)  tried several times to print handout;emailed pt pictures because they would not print   Comprehension Returned demonstration                 PT Long Term Goals - 12/13/20 1405       PT LONG TERM GOAL #1    Title Pt will be independent in a HEP for  UE and LE strength/ROM, balance    Time 6    Period Weeks    Status New    Target Date 01/24/21      PT LONG TERM GOAL #2   Title Pt will be independent in self MLD    Time 6    Period Weeks    Status New    Target Date 01/24/21      PT LONG TERM GOAL #3   Title Pt will be able to perform 11 sit to stands in 30 sec for improved function and decreased fall risk    Time 6    Period Weeks    Status New    Target Date 01/24/21      PT LONG TERM GOAL #4   Title Pt will be able to increase walking time to 20-30 minutes daily, not necessarily at 1 time.    Baseline 10 min    Time 6    Period Weeks    Target Date 01/24/21                   Plan - 12/17/20 1638     Clinical Impression Statement Pts balance was tested and she was able to hold 4 position balance test for 10 seconds in all positions.  LE strength was assessed with weakness noted bilaterally.  Pt was instructed in LE strength exercises and HEP was emailed secondary to therapist unable to print from [place]. Therapist performed MLD on pt in supine and pt was instructed in same in sitting. She did exceptionally well using good stretch and technique.  She was given written instructions.    Personal Factors and Comorbidities Comorbidity 3+    Comorbidities Bilateral breast CA with Right SLNB, radiation,chemo, FMS,hypothyroidism    Stability/Clinical Decision Making Stable/Uncomplicated    Rehab Potential Excellent    PT Frequency 2x / week    PT Treatment/Interventions ADLs/Self Care Home Management;Therapeutic exercise;Patient/family education;Neuromuscular re-education;Balance training;Manual techniques;Manual lymph drainage;Passive range of motion    PT Next Visit Plan review MLD. Add breast to MLD (B?) May use left axillary for MLD to right breast as no LN taken there, Cont. LE strength/balance, shoulder ROM prn   PT Home Exercise Plan piriformis stretch, sitting hip   IR and ER with red band, standing hip abd with red, supine clam with ppt and red band    Consulted and Agree with Plan of Care Patient             Patient will benefit from skilled therapeutic intervention in order to improve the following deficits and impairments:  Decreased range of motion, Decreased knowledge of precautions, Pain, Impaired UE functional use, Increased edema  Visit Diagnosis: Ductal carcinoma in situ (DCIS) of left breast  Aftercare following surgery for neoplasm  Localized edema  Malignant neoplasm of upper-outer quadrant of right breast in female, estrogen receptor positive (HCC)  Abnormal posture  Muscle weakness (generalized)  Stiffness of left shoulder, not elsewhere classified  Stiffness of right shoulder, not elsewhere classified     Problem List Patient Active Problem List   Diagnosis Date Noted  Malignant neoplasm of upper-outer quadrant of right breast in female, estrogen receptor positive ([place]) 06/25/2020   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   Hepatic steatosis 11/27/2014   Hx of adenomatous colonic polyps 11/27/2014   Visit for preventive health examination 03/28/2014   Hyperlipidemia 03/28/2014   Left rotator cuff tear 07/07/2013   Tear of left rotator cuff 07/07/2013   Rhinitis 06/01/2013   Chest wall deformity 04/12/2012   Atrial septal aneurysm / if pfo  echo 6 13  10/23/2011   OSA (obstructive sleep apnea) 11/27/2010   Dyslipidemia 11/01/2010   Flushing 08/29/2010   Labile hypertension 08/29/2010   MUSCLE CRAMPS, FOOT 06/10/2010   OTHER SLEEP DISTURBANCES 06/10/2010   ANXIETY, SITUATIONAL 05/08/2010   VERTIGO, POSITIONAL 03/15/2010   VITAMIN D DEFICIENCY 02/18/2010   OBESITY 02/18/2010   ALLERGIC RHINITIS 02/18/2010   PLANTAR FASCIITIS 02/18/2010   TWITCHING 06/11/2009   NUMBNESS, HAND 06/11/2009   CERVICAL STRAIN, ACUTE 06/11/2009   OTHER MALAISE AND FATIGUE 07/09/2007   HYPERTENSION 01/28/2007    Hypothyroidism 11/11/2006   COMMON MIGRAINE 11/11/2006   GERD 11/11/2006   FIBROMYALGIA 11/11/2006    Elsie Ra Azalya Galyon 12/17/2020, 4:49 PM  [place], [place], [address] Phone: [phone]   Fax:  [phone]  Name: [name] MRN: [id number] Date of Birth: [birth year]  Cheral Almas, PT 12/17/20 4:52 PM

## 2020-12-17 NOTE — Patient Instructions (Signed)
Access Code: [email or web address] URL: [email or web address] Date: 12/17/2020 Prepared by: Cheral Almas  Exercises Supine Bridge - 1 x daily - 3 x weekly - 1 sets - 10 reps Supine Posterior Pelvic Tilt - 1 x daily - 3 x weekly - 1 sets - 10 reps - 5 hold Hooklying Clamshell with Resistance - 1 x daily - 3 x weekly - 1 sets - 10 reps Supine Figure 4 Piriformis Stretch - 1 x daily - 7 x weekly - 1 sets - 3 reps - 20 hold Seated Hip Internal Rotation with Ball and Resistance - 1 x daily - 3 x weekly - 1 sets - 10 reps Seated Hip External Rotation with Resistance - 1 x daily - 3 x weekly - 1 sets - 10 reps Standing Hip Abduction with Counter Support - 1 x daily - 3 x weekly - 1 sets - 10 reps Manual Lymph Drainage for Right Breast.  Do daily.  Do slowly. Use flat hands with just enough pressure to stretch the skin. Do not slide over the skin, but move the skin with the hand you're using. Lie down or sit comfortably (in a recliner, for example) to do this.  Hug yourself:  cross arms and do circles at collar bones near neck 5-7 times (to "wake up" lots of lymph nodes in this area). Take slow deep breaths, allowing your belly to balloon out as your breathe in, 5x (to "wake up" abdominal lymph nodes to take on extra fluid). 3)  A) right armpit;stretch skin in small circles to stimulate LN's B)Right groin area, at panty line--stretch skin in small circles to stimulate lymph nodes 5-7x.  Redirect fluid from right armpit toward right groin (cup your hand around the curve of your right side and do 3-4 "pumps" from armpit to groin) 3-4x down your side. Draw an imaginary diagonal line from upper outer breast through the nipple area toward lower inner breast.  Direct fluid upward and inward from this line toward the pathway across your upper chest (established in #5).  Do this in three rows to treat all of the upper inner breast tissue, and do each row 3-4x. Then repeat #5 above. From the imaginary  diagonal, direct fluid in three rows (to treat all of lower outer breast tissue) downward and outward toward pathway established in #6 that is aimed at the right groin.  Then repeat #6 above.  End with repeating #3 A and 3B above.   [facility] [address], [place]   [address] [phone]

## 2020-12-18 ENCOUNTER — Ambulatory Visit
Admission: RE | Admit: 2020-12-18 | Discharge: 2020-12-18 | Disposition: A | Discharge status: Home / Self Care | Payer: Medicare HMO | Source: Ambulatory Visit | Attending: Radiation Oncology | Admitting: Radiation Oncology

## 2020-12-18 DIAGNOSIS — Z170 Estrogen receptor positive status [ER+]: Secondary | ICD-10-CM | POA: Diagnosis not present

## 2020-12-18 DIAGNOSIS — D0512 Intraductal carcinoma in situ of left breast: Secondary | ICD-10-CM | POA: Diagnosis not present

## 2020-12-18 DIAGNOSIS — Z510 Encounter for antineoplastic radiation therapy: Secondary | ICD-10-CM | POA: Diagnosis not present

## 2020-12-18 DIAGNOSIS — C50411 Malignant neoplasm of upper-outer quadrant of right female breast: Secondary | ICD-10-CM | POA: Diagnosis not present

## 2020-12-19 ENCOUNTER — Ambulatory Visit
Admission: RE | Admit: 2020-12-19 | Discharge: 2020-12-19 | Disposition: A | Discharge status: Home / Self Care | Payer: Medicare HMO | Source: Ambulatory Visit | Attending: Radiation Oncology | Admitting: Radiation Oncology

## 2020-12-19 ENCOUNTER — Other Ambulatory Visit: Payer: Self-pay

## 2020-12-19 DIAGNOSIS — Z170 Estrogen receptor positive status [ER+]: Secondary | ICD-10-CM | POA: Diagnosis not present

## 2020-12-19 DIAGNOSIS — D0512 Intraductal carcinoma in situ of left breast: Secondary | ICD-10-CM | POA: Diagnosis not present

## 2020-12-19 DIAGNOSIS — Z510 Encounter for antineoplastic radiation therapy: Secondary | ICD-10-CM | POA: Diagnosis not present

## 2020-12-19 DIAGNOSIS — C50411 Malignant neoplasm of upper-outer quadrant of right female breast: Secondary | ICD-10-CM | POA: Diagnosis not present

## 2020-12-20 ENCOUNTER — Ambulatory Visit
Admission: RE | Admit: 2020-12-20 | Discharge: 2020-12-20 | Disposition: A | Discharge status: Home / Self Care | Payer: Medicare HMO | Source: Ambulatory Visit | Attending: Radiation Oncology | Admitting: Radiation Oncology

## 2020-12-20 ENCOUNTER — Ambulatory Visit: Payer: Medicare HMO

## 2020-12-20 DIAGNOSIS — Z510 Encounter for antineoplastic radiation therapy: Secondary | ICD-10-CM | POA: Diagnosis not present

## 2020-12-20 DIAGNOSIS — Z170 Estrogen receptor positive status [ER+]: Secondary | ICD-10-CM

## 2020-12-20 DIAGNOSIS — M9901 Segmental and somatic dysfunction of cervical region: Secondary | ICD-10-CM | POA: Diagnosis not present

## 2020-12-20 DIAGNOSIS — R600 Localized edema: Secondary | ICD-10-CM

## 2020-12-20 DIAGNOSIS — M25611 Stiffness of right shoulder, not elsewhere classified: Secondary | ICD-10-CM | POA: Diagnosis not present

## 2020-12-20 DIAGNOSIS — M9903 Segmental and somatic dysfunction of lumbar region: Secondary | ICD-10-CM | POA: Diagnosis not present

## 2020-12-20 DIAGNOSIS — M25612 Stiffness of left shoulder, not elsewhere classified: Secondary | ICD-10-CM

## 2020-12-20 DIAGNOSIS — D0512 Intraductal carcinoma in situ of left breast: Secondary | ICD-10-CM | POA: Diagnosis not present

## 2020-12-20 DIAGNOSIS — R293 Abnormal posture: Secondary | ICD-10-CM | POA: Diagnosis not present

## 2020-12-20 DIAGNOSIS — M6281 Muscle weakness (generalized): Secondary | ICD-10-CM | POA: Diagnosis not present

## 2020-12-20 DIAGNOSIS — M9902 Segmental and somatic dysfunction of thoracic region: Secondary | ICD-10-CM | POA: Diagnosis not present

## 2020-12-20 DIAGNOSIS — Z483 Aftercare following surgery for neoplasm: Secondary | ICD-10-CM

## 2020-12-20 DIAGNOSIS — M531 Cervicobrachial syndrome: Secondary | ICD-10-CM | POA: Diagnosis not present

## 2020-12-20 DIAGNOSIS — C50411 Malignant neoplasm of upper-outer quadrant of right female breast: Secondary | ICD-10-CM

## 2020-12-20 NOTE — Therapy (Signed)
[place], [place], [address] Phone: [phone]   Fax:  [phone]  Physical Therapy Treatment  Patient Details  Name: [name] MRN: [id number] Date of Birth: 10-17-1955 Referring Provider (PT): Isidore Moos   Encounter Date: 12/20/2020   PT End of Session - 12/20/20 1524     Visit Number 3    Number of Visits 12    Date for PT Re-Evaluation 01/24/21    PT Start Time [id number]    PT Stop Time 1400    PT Time Calculation (min) 58 min    Activity Tolerance Patient tolerated treatment well    Behavior During Therapy [facility] for tasks assessed/performed             Past Medical History:  Diagnosis Date   Anal fissure    Atrial septal aneurysm / if pfo  echo 6 13  10/23/2011   Breast cancer ([place])    Cataract    both eyes   Depression    Fatty liver    "pre fatty liver"   Fibromyalgia    Headache(784.0)    hx of migraines when younger   Heart palpitations    hx with normal holter event monitoring   Hx: UTI (urinary tract infection)    Hyperlipidemia    Hypertension    Hypothyroidism    Obesity    Pneumonia 1972   hx of   Polyp of colon    Serrated adenoma of colon 08/2012   Skin cancer    basal, squamous cell   Sleep apnea    uses cpap    Past Surgical History:  Procedure Laterality Date   BREAST LUMPECTOMY WITH RADIOACTIVE SEED AND SENTINEL LYMPH NODE BIOPSY Right 07/26/2020   Procedure: RIGHT BREAST LUMPECTOMY WITH RADIOACTIVE SEED AND SENTINEL LYMPH NODE BIOPSY;  Surgeon: Jovita Kussmaul, MD;  Location: [place];  Service: General;  Laterality: Right;   BREAST LUMPECTOMY WITH RADIOACTIVE SEED LOCALIZATION Left 07/26/2020   Procedure: LEFT BREAST LUMPECTOMY X 2  WITH RADIOACTIVE SEED LOCALIZATION;  Surgeon: Jovita Kussmaul, MD;  Location: [place];  Service: General;  Laterality: Left;   COLONOSCOPY     DILATION AND CURETTAGE OF [place]  ARTHROSCOPY     both in past   POLYPECTOMY     [place] Left 07/07/2013   Procedure: LEFT SHOULDER MINI OPEN SUBACROMIAL DECOMPRESSION ROTATOR CUFF REPAIR AND POSSIBLE PATCH GRAFT ;  Surgeon: Johnn Hai, MD;  [facility];  Service: Orthopedics;  Laterality: Left;  with interscaline block   SKIN CANCER EXCISION Bilateral    arm, legs, and chest   TONSILLECTOMY      There were no vitals filed for this visit.   Subjective Assessment - 12/20/20 1304     Subjective I did the exercises and the MLD but no pictures printed so I wasn't sure if I was doing it right. Are you able to print them today? I have been working on SLS at  home. I feel tight in the right axillary region today from radiation.    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 She underwent a left lumpectomy for DCIS and a right lumpectomy and sentinel node biopsy on 07/26/2020. She had 2 lymph nodes removed with 1 being positive for cancer on the right side. It is ER/PR positive.  Left shoulder rotator cuff repair 06/2013.    Patient Stated Goals Learn MLD, strengthen legs so legs don't feel so fatigued, sit to stand    Currently in Pain? No/denies    Pain Score 0-No pain                               [facility] Adult PT Treatment/Exercise - 12/20/20 0001       Manual Therapy   Edema Management Pt was given script for [facility] bra [facility] and was advised to call to make appt. Ansered pt questions about right leg/ankle/stockings    Manual Lymphatic Drainage (MLD) Pt was instructed in MLD to trunk and breast in supine with head of table raised.Short neck, activated right axillary and inguinal LN's, Right axillo-inguinal pathway worked multiple times to decrease swelling at right axillary region and added right breast..  Pt was instructed in same and given written instructions.  Repeated same on left  side which did not apear to be swollen today.                        PT Long Term Goals - 12/13/20 1405       PT LONG TERM GOAL #1   Title Pt will be independent in a HEP for  UE and LE strength/ROM, balance    Time 6    Period Weeks    Status New    Target Date 01/24/21      PT LONG TERM GOAL #2   Title Pt will be independent in self MLD    Time 6    Period Weeks    Status New    Target Date 01/24/21      PT LONG TERM GOAL #3   Title Pt will be able to perform 11 sit to stands in 30 sec for improved function and decreased fall risk    Time 6    Period Weeks    Status New    Target Date 01/24/21      PT LONG TERM GOAL #4   Title Pt will be able to increase walking time to 20-30 minutes daily, not necessarily at 1 time.    Baseline 10 min    Time 6    Period Weeks    Target Date 01/24/21                   Plan - 12/20/20 1525     Clinical Impression Statement We reviewed MLD as instructed previously and added right breast secondary to lateral breast swelling.  Pt also performed on left side secondary to concerns of left breast swelling, although no swelling was noted today. She did very well using good stretch and technique with only occasional VC's. She was given a script for a compression bra and she will [facility] for an [facility]t. Exercises from the previous days therapy were printed for her secondary to her email not printing exercises. We reviewed shoulder AAROM exercises for pt to perform thoughout radiation so she does not experience any tightness with radiation.    Personal Factors and Comorbidities Comorbidity 3+    Comorbidities Bilateral breast CA with Right SLNB, radiation,chemo, FMS,hypothyroidism    Rehab Potential Excellent    PT Frequency 2x / week    PT Duration 6 weeks    PT Treatment/Interventions ADLs/Self Care Home Management;Therapeutic exercise;Patient/family education;Neuromuscular re-education;Balance training;Manual  techniques;Manual lymph drainage;Passive range of motion    PT Next Visit Plan review MLD. May use left axillary for MLD to right breast as no LN taken there, Cont. LE strength/balance, shoulder ROM, vector reaches, heel raises without support, SLS, sit to stand    PT Home Exercise Plan piriformis stretch, sitting hip  IR and ER with red band, standing hip abd with red, supine clam with ppt and red band    Consulted and Agree with Plan of Care Patient             Patient will benefit from skilled therapeutic intervention in order to improve the following deficits and impairments:  Decreased range of motion, Decreased knowledge of precautions, Pain, Impaired UE functional use, Increased edema  Visit Diagnosis: Ductal carcinoma in situ (DCIS) of left breast  Aftercare following surgery for neoplasm  Localized edema  Malignant neoplasm of upper-outer quadrant of right breast in female, estrogen receptor positive (HCC)  Abnormal posture  Muscle weakness (generalized)  Stiffness of left shoulder, not elsewhere classified  Stiffness of right shoulder, not elsewhere classified     Problem List Patient Active Problem List   Diagnosis Date Noted   Malignant neoplasm of upper-outer quadrant of right breast in female, estrogen receptor positive ([place]) 06/25/2020   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   Hepatic steatosis 11/27/2014   Hx of adenomatous colonic polyps 11/27/2014   Visit for preventive health examination 03/28/2014   Hyperlipidemia 03/28/2014   Left rotator cuff tear 07/07/2013   Tear of left rotator cuff 07/07/2013   Rhinitis 06/01/2013   Chest wall deformity 04/12/2012   Atrial septal aneurysm / if pfo  echo 6 13  10/23/2011   OSA (obstructive sleep apnea) 11/27/2010   Dyslipidemia 11/01/2010   Flushing 08/29/2010   Labile hypertension 08/29/2010   MUSCLE CRAMPS, FOOT 06/10/2010   OTHER SLEEP DISTURBANCES 06/10/2010   ANXIETY, SITUATIONAL  05/08/2010   VERTIGO, POSITIONAL 03/15/2010   VITAMIN D DEFICIENCY 02/18/2010   OBESITY 02/18/2010   ALLERGIC RHINITIS 02/18/2010   PLANTAR FASCIITIS 02/18/2010   TWITCHING 06/11/2009   NUMBNESS, HAND 06/11/2009   CERVICAL STRAIN, ACUTE 06/11/2009   OTHER MALAISE AND FATIGUE 07/09/2007   HYPERTENSION 01/28/2007   Hypothyroidism 11/11/2006   COMMON MIGRAINE 11/11/2006   GERD 11/11/2006   FIBROMYALGIA 11/11/2006    Elsie Ra Jarrin Staley 12/20/2020, 3:33 PM  [facility] [address] [place], [place], [address] Phone: [phone]   Fax:  [phone]  Name: [name] MRN: [id number] Date of Birth: 1956-03-28 Cheral Almas, PT 12/20/20 3:35 PM

## 2020-12-21 ENCOUNTER — Other Ambulatory Visit: Payer: Self-pay

## 2020-12-21 ENCOUNTER — Ambulatory Visit
Admission: RE | Admit: 2020-12-21 | Discharge: 2020-12-21 | Disposition: A | Discharge status: Home / Self Care | Payer: Medicare HMO | Source: Ambulatory Visit | Attending: Radiation Oncology | Admitting: Radiation Oncology

## 2020-12-21 DIAGNOSIS — C50411 Malignant neoplasm of upper-outer quadrant of right female breast: Secondary | ICD-10-CM | POA: Diagnosis not present

## 2020-12-21 DIAGNOSIS — Z170 Estrogen receptor positive status [ER+]: Secondary | ICD-10-CM | POA: Diagnosis not present

## 2020-12-21 DIAGNOSIS — D0512 Intraductal carcinoma in situ of left breast: Secondary | ICD-10-CM | POA: Diagnosis not present

## 2020-12-21 DIAGNOSIS — Z510 Encounter for antineoplastic radiation therapy: Secondary | ICD-10-CM | POA: Diagnosis not present

## 2020-12-24 ENCOUNTER — Ambulatory Visit
Admission: RE | Admit: 2020-12-24 | Discharge: 2020-12-24 | Disposition: A | Discharge status: Home / Self Care | Payer: Medicare HMO | Source: Ambulatory Visit | Attending: Radiation Oncology | Admitting: Radiation Oncology

## 2020-12-24 ENCOUNTER — Other Ambulatory Visit: Payer: Self-pay

## 2020-12-24 ENCOUNTER — Ambulatory Visit: Payer: Medicare HMO | Admitting: Radiation Oncology

## 2020-12-24 DIAGNOSIS — C50411 Malignant neoplasm of upper-outer quadrant of right female breast: Secondary | ICD-10-CM | POA: Diagnosis not present

## 2020-12-24 DIAGNOSIS — D0512 Intraductal carcinoma in situ of left breast: Secondary | ICD-10-CM | POA: Diagnosis not present

## 2020-12-24 DIAGNOSIS — Z170 Estrogen receptor positive status [ER+]: Secondary | ICD-10-CM | POA: Diagnosis not present

## 2020-12-24 DIAGNOSIS — Z510 Encounter for antineoplastic radiation therapy: Secondary | ICD-10-CM | POA: Diagnosis not present

## 2020-12-25 ENCOUNTER — Ambulatory Visit
Admission: RE | Admit: 2020-12-25 | Discharge: 2020-12-25 | Disposition: A | Discharge status: Home / Self Care | Payer: Medicare HMO | Source: Ambulatory Visit | Attending: Radiation Oncology | Admitting: Radiation Oncology

## 2020-12-25 ENCOUNTER — Ambulatory Visit: Payer: Medicare HMO

## 2020-12-25 DIAGNOSIS — M6281 Muscle weakness (generalized): Secondary | ICD-10-CM

## 2020-12-25 DIAGNOSIS — R293 Abnormal posture: Secondary | ICD-10-CM | POA: Diagnosis not present

## 2020-12-25 DIAGNOSIS — M25612 Stiffness of left shoulder, not elsewhere classified: Secondary | ICD-10-CM

## 2020-12-25 DIAGNOSIS — D0512 Intraductal carcinoma in situ of left breast: Secondary | ICD-10-CM | POA: Diagnosis not present

## 2020-12-25 DIAGNOSIS — C50411 Malignant neoplasm of upper-outer quadrant of right female breast: Secondary | ICD-10-CM | POA: Diagnosis not present

## 2020-12-25 DIAGNOSIS — M25611 Stiffness of right shoulder, not elsewhere classified: Secondary | ICD-10-CM

## 2020-12-25 DIAGNOSIS — Z170 Estrogen receptor positive status [ER+]: Secondary | ICD-10-CM | POA: Diagnosis not present

## 2020-12-25 DIAGNOSIS — Z483 Aftercare following surgery for neoplasm: Secondary | ICD-10-CM

## 2020-12-25 DIAGNOSIS — Z510 Encounter for antineoplastic radiation therapy: Secondary | ICD-10-CM | POA: Diagnosis not present

## 2020-12-25 DIAGNOSIS — R600 Localized edema: Secondary | ICD-10-CM | POA: Diagnosis not present

## 2020-12-25 NOTE — Patient Instructions (Addendum)
Manual Lymph Drainage for Right Breast.  Do daily.  Do slowly. Use flat hands with just enough pressure to stretch the skin. Do not slide over the skin, but move the skin with the hand you're using. Lie down or sit comfortably (in a recliner, for example) to do this.  Hug yourself:  cross arms and do circles at collar bones near neck 5-7 times (to "wake up" lots of lymph nodes in this area). Take slow deep breaths, allowing your belly to balloon out as your breathe in, 5x (to "wake up" abdominal lymph nodes to take on extra fluid). Left armpit--stretch skin in small circles to stimulate intact lymph nodes there, 5-7x. Right groin area, at panty line--stretch skin in small circles to stimulate lymph nodes 5-7x. Redirect fluid from right chest toward left armpit (stretch skin starting at right chest in 3-4 spots working toward left armpit) 3-4x across the chest. Redirect fluid from right armpit toward right groin (cup your hand around the curve of your right side and do 3-4 "pumps" from armpit to groin) 3-4x down your side. Draw an imaginary diagonal line from upper outer breast through the nipple area toward lower inner breast.  Direct fluid upward and inward from this line toward the pathway across your upper chest (established in #5).  Do this in three rows to treat all of the upper inner breast tissue, and do each row 3-4x. Then repeat #5 above. From the imaginary diagonal, direct fluid in three rows (to treat all of lower outer breast tissue) downward and outward toward pathway established in #6 that is aimed at the right groin.  Then repeat #6 above.  End with repeating #3 and #4 above.  Over Head Pull: Narrow and Wide Grip   Cancer Rehab [phone]   On back, knees bent, feet flat, band across thighs, elbows straight but relaxed. Pull hands apart (start). Keeping elbows straight, bring arms up and over head, hands toward floor. Keep pull steady on band. Hold momentarily. Return slowly,  keeping pull steady, back to start. Then do same with a wider grip on the band (past shoulder width) Repeat _5-10__ times. Band color __yellow____   Side Pull: Double Arm   On back, knees bent, feet flat. Arms perpendicular to body, shoulder level, elbows straight but relaxed. Pull arms out to sides, elbows straight. Resistance band comes across collarbones, hands toward floor. Hold momentarily. Slowly return to starting position. Repeat _5-10__ times. Band color _yellow____   Sword   On back, knees bent, feet flat, left hand on left hip, right hand above left. Pull right arm DIAGONALLY (hip to shoulder) across chest. Bring right arm along head toward floor. Hold momentarily. Slowly return to starting position. Repeat _5-10__ times. Do with left arm. Band color _yellow_____   Shoulder Rotation: Double Arm   On back, knees bent, feet flat, elbows tucked at sides, bent 90, hands palms up. Pull hands apart and down toward floor, keeping elbows near sides. Hold momentarily. Slowly return to starting position. Repeat _5-10__ times. Band color __yellow____     [facility] [address],    [address] [phone]

## 2020-12-25 NOTE — Therapy (Signed)
[place], [place], [address] Phone: [phone]   Fax:  [phone]  Physical Therapy Treatment  Patient Details  Name: [name] MRN: [id number] Date of Birth: [birth year] Referring Provider (PT): Isidore Moos   Encounter Date: 12/25/2020   PT End of Session - 12/25/20 1012     Visit Number 4    Number of Visits 12    Date for PT Re-Evaluation 01/24/21    PT Start Time 0906    PT Stop Time [id number]    PT Time Calculation (min) 56 min    Activity Tolerance Patient tolerated treatment well    Behavior During Therapy [facility] for tasks assessed/performed             Past Medical History:  Diagnosis Date   Anal fissure    Atrial septal aneurysm / if pfo  echo 6 13  10/23/2011   Breast cancer ([place])    Cataract    both eyes   Depression    Fatty liver    "pre fatty liver"   Fibromyalgia    Headache(784.0)    hx of migraines when younger   Heart palpitations    hx with normal holter event monitoring   Hx: UTI (urinary tract infection)    Hyperlipidemia    Hypertension    Hypothyroidism    Obesity    Pneumonia 1972   hx of   Polyp of colon    Serrated adenoma of colon 08/2012   Skin cancer    basal, squamous cell   Sleep apnea    uses cpap    Past Surgical History:  Procedure Laterality Date   BREAST LUMPECTOMY WITH RADIOACTIVE SEED AND SENTINEL LYMPH NODE BIOPSY Right 07/26/2020   Procedure: RIGHT BREAST LUMPECTOMY WITH RADIOACTIVE SEED AND SENTINEL LYMPH NODE BIOPSY;  Surgeon: Jovita Kussmaul, MD;  Location: [place];  Service: General;  Laterality: Right;   BREAST LUMPECTOMY WITH RADIOACTIVE SEED LOCALIZATION Left 07/26/2020   Procedure: LEFT BREAST LUMPECTOMY X 2  WITH RADIOACTIVE SEED LOCALIZATION;  Surgeon: Jovita Kussmaul, MD;  Location: [place];  Service: General;  Laterality: Left;   COLONOSCOPY     DILATION AND CURETTAGE OF [place]  ARTHROSCOPY     both in past   POLYPECTOMY     [place] Left 07/07/2013   Procedure: LEFT SHOULDER MINI OPEN SUBACROMIAL DECOMPRESSION ROTATOR CUFF REPAIR AND POSSIBLE PATCH GRAFT ;  Surgeon: Johnn Hai, MD;  [facility];  Service: Orthopedics;  Laterality: Left;  with interscaline block   SKIN CANCER EXCISION Bilateral    arm, legs, and chest   TONSILLECTOMY      There were no vitals filed for this visit.   Subjective Assessment - 12/25/20 0906     Subjective I tried doing the MLD but wasn't sure if I was doing it right.  I have done some of the exs.  I have a little rash at my right chest that is itchy.  she will ask when she goes to radiation today. I have an appt at 5:30 on Friday for the compression bra.    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 She underwent a left lumpectomy for DCIS and a right lumpectomy and sentinel node biopsy on 07/26/2020. She had 2 lymph nodes removed with 1 being positive for  cancer on the right side. It is ER/PR positive. Left shoulder rotator cuff repair 06/2013.    Patient Stated Goals Learn MLD, strengthen legs so legs don't feel so fatigued, sit to stand    Currently in Pain? No/denies    Pain Score 0-No pain    Multiple Pain Sites No                               [facility] Adult PT Treatment/Exercise - 12/25/20 0001       Knee/Hip Exercises: Standing   Heel Raises Both;15 reps    SLS with Vectors 5ea      Knee/Hip Exercises: Seated   Sit to Sand 5 reps   held secondary to knee pain     Shoulder Exercises: Supine   Horizontal ABduction Strengthening;Both;10 reps    Theraband Level (Shoulder Horizontal ABduction) Level 1 (Yellow)    External Rotation Strengthening;Both;10 reps      Manual Therapy   Manual Lymphatic Drainage (MLD) Pt was instructed in MLD to trunk and breast in supine with head of table raised.Short  neck, activated right axillary and inguinal LN's, Right axillo-inguinal pathway worked multiple times to decrease swelling at right axillary region and added right breast..  Pt was instructed in same and given written instructions                        PT Long Term Goals - 12/13/20 1405       PT LONG TERM GOAL #1   Title Pt will be independent in a HEP for  UE and LE strength/ROM, balance    Time 6    Period Weeks    Status New    Target Date 01/24/21      PT LONG TERM GOAL #2   Title Pt will be independent in self MLD    Time 6    Period Weeks    Status New    Target Date 01/24/21      PT LONG TERM GOAL #3   Title Pt will be able to perform 11 sit to stands in 30 sec for improved function and decreased fall risk    Time 6    Period Weeks    Status New    Target Date 01/24/21      PT LONG TERM GOAL #4   Title Pt will be able to increase walking time to 20-30 minutes daily, not necessarily at 1 time.    Baseline 10 min    Time 6    Period Weeks    Target Date 01/24/21                   Plan - 12/25/20 1012     Clinical Impression Statement PT performed MLD and instructed pt while doing and then pt performed.  She uses very good technique but occasionally gets confused on direction for LN activation.  She was instructed in supine scap series horizontal abduction, and ER and these were added to HEP.    Personal Factors and Comorbidities Comorbidity 3+    Comorbidities Bilateral breast CA with Right SLNB, radiation,chemo, FMS,hypothyroidism    Stability/Clinical Decision Making Stable/Uncomplicated    Rehab Potential Excellent    PT Frequency 2x / week    PT Duration 6 weeks    PT Treatment/Interventions ADLs/Self Care Home Management;Therapeutic exercise;Patient/family education;Neuromuscular re-education;Balance training;Manual techniques;Manual lymph drainage;Passive range of motion  PT Next Visit Plan review MLD prn, supine scap series, LE  strength/balance    PT Home Exercise Plan piriformis stretch, sitting hip  IR and ER with red band, standing hip abd with red, supine clam with ppt and red band, supine HA and ER with yellow    Consulted and Agree with Plan of Care Patient             Patient will benefit from skilled therapeutic intervention in order to improve the following deficits and impairments:  Decreased range of motion, Decreased knowledge of precautions, Pain, Impaired UE functional use, Increased edema  Visit Diagnosis: Ductal carcinoma in situ (DCIS) of left breast  Aftercare following surgery for neoplasm  Localized edema  Abnormal posture  Muscle weakness (generalized)  Stiffness of right shoulder, not elsewhere classified  Stiffness of left shoulder, not elsewhere classified  Malignant neoplasm of upper-outer quadrant of right breast in female, estrogen receptor positive [facility])     Problem List Patient Active Problem List   Diagnosis Date Noted   Malignant neoplasm of upper-outer quadrant of right breast in female, estrogen receptor positive ([place]) 06/25/2020   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   Hepatic steatosis 11/27/2014   Hx of adenomatous colonic polyps 11/27/2014   Visit for preventive health examination 03/28/2014   Hyperlipidemia 03/28/2014   Left rotator cuff tear 07/07/2013   Tear of left rotator cuff 07/07/2013   Rhinitis 06/01/2013   Chest wall deformity 04/12/2012   Atrial septal aneurysm / if pfo  echo 6 13  10/23/2011   OSA (obstructive sleep apnea) 11/27/2010   Dyslipidemia 11/01/2010   Flushing 08/29/2010   Labile hypertension 08/29/2010   MUSCLE CRAMPS, FOOT 06/10/2010   OTHER SLEEP DISTURBANCES 06/10/2010   ANXIETY, SITUATIONAL 05/08/2010   VERTIGO, POSITIONAL 03/15/2010   VITAMIN D DEFICIENCY 02/18/2010   OBESITY 02/18/2010   ALLERGIC RHINITIS 02/18/2010   PLANTAR FASCIITIS 02/18/2010   TWITCHING 06/11/2009   NUMBNESS, HAND 06/11/2009    CERVICAL STRAIN, ACUTE 06/11/2009   OTHER MALAISE AND FATIGUE 07/09/2007   HYPERTENSION 01/28/2007   Hypothyroidism 11/11/2006   COMMON MIGRAINE 11/11/2006   GERD 11/11/2006   FIBROMYALGIA 11/11/2006    Elsie Ra Kayla Weekes 12/25/2020, 10:16 AM  [place], [place], [address] Phone: [phone]   Fax:  [phone]  Name: [name] MRN: [id number] Date of Birth: 10/20/1955  Cheral Almas, PT 12/25/20 10:18 AM

## 2020-12-26 ENCOUNTER — Ambulatory Visit
Admission: RE | Admit: 2020-12-26 | Discharge: 2020-12-26 | Disposition: A | Discharge status: Home / Self Care | Payer: Medicare HMO | Source: Ambulatory Visit | Attending: Radiation Oncology | Admitting: Radiation Oncology

## 2020-12-26 ENCOUNTER — Other Ambulatory Visit: Payer: Self-pay

## 2020-12-26 ENCOUNTER — Encounter: Payer: Self-pay | Admitting: General Practice

## 2020-12-26 DIAGNOSIS — R69 Illness, unspecified: Secondary | ICD-10-CM | POA: Diagnosis not present

## 2020-12-26 DIAGNOSIS — C50411 Malignant neoplasm of upper-outer quadrant of right female breast: Secondary | ICD-10-CM | POA: Diagnosis not present

## 2020-12-26 DIAGNOSIS — D0512 Intraductal carcinoma in situ of left breast: Secondary | ICD-10-CM | POA: Diagnosis not present

## 2020-12-26 DIAGNOSIS — Z170 Estrogen receptor positive status [ER+]: Secondary | ICD-10-CM | POA: Diagnosis not present

## 2020-12-26 DIAGNOSIS — Z510 Encounter for antineoplastic radiation therapy: Secondary | ICD-10-CM | POA: Diagnosis not present

## 2020-12-26 NOTE — Progress Notes (Signed)
[place] Spiritual Care Note  Referred by Willodean Rosenthal for follow-up pastoral support. Left voicemail of care and support, encouraging return call.   [place], [place], [facility] Pager [phone] Voicemail [phone]

## 2020-12-27 ENCOUNTER — Ambulatory Visit
Admission: RE | Admit: 2020-12-27 | Discharge: 2020-12-27 | Disposition: A | Discharge status: Home / Self Care | Payer: Medicare HMO | Source: Ambulatory Visit | Attending: Radiation Oncology | Admitting: Radiation Oncology

## 2020-12-27 DIAGNOSIS — Z170 Estrogen receptor positive status [ER+]: Secondary | ICD-10-CM | POA: Diagnosis not present

## 2020-12-27 DIAGNOSIS — D0512 Intraductal carcinoma in situ of left breast: Secondary | ICD-10-CM | POA: Diagnosis not present

## 2020-12-27 DIAGNOSIS — Z510 Encounter for antineoplastic radiation therapy: Secondary | ICD-10-CM | POA: Diagnosis not present

## 2020-12-27 DIAGNOSIS — C50411 Malignant neoplasm of upper-outer quadrant of right female breast: Secondary | ICD-10-CM | POA: Diagnosis not present

## 2020-12-28 ENCOUNTER — Ambulatory Visit
Admission: RE | Admit: 2020-12-28 | Discharge: 2020-12-28 | Disposition: A | Discharge status: Home / Self Care | Payer: Medicare HMO | Source: Ambulatory Visit | Attending: Radiation Oncology | Admitting: Radiation Oncology

## 2020-12-28 ENCOUNTER — Other Ambulatory Visit: Payer: Self-pay

## 2020-12-28 DIAGNOSIS — C50912 Malignant neoplasm of unspecified site of left female breast: Secondary | ICD-10-CM | POA: Diagnosis not present

## 2020-12-28 DIAGNOSIS — C50411 Malignant neoplasm of upper-outer quadrant of right female breast: Secondary | ICD-10-CM | POA: Diagnosis not present

## 2020-12-28 DIAGNOSIS — Z510 Encounter for antineoplastic radiation therapy: Secondary | ICD-10-CM | POA: Diagnosis not present

## 2020-12-28 DIAGNOSIS — Z170 Estrogen receptor positive status [ER+]: Secondary | ICD-10-CM | POA: Diagnosis not present

## 2020-12-28 DIAGNOSIS — D0512 Intraductal carcinoma in situ of left breast: Secondary | ICD-10-CM | POA: Diagnosis not present

## 2020-12-28 DIAGNOSIS — C50911 Malignant neoplasm of unspecified site of right female breast: Secondary | ICD-10-CM | POA: Diagnosis not present

## 2020-12-31 ENCOUNTER — Ambulatory Visit
Admission: RE | Admit: 2020-12-31 | Discharge: 2020-12-31 | Disposition: A | Discharge status: Home / Self Care | Payer: Medicare HMO | Source: Ambulatory Visit | Attending: Radiation Oncology | Admitting: Radiation Oncology

## 2020-12-31 ENCOUNTER — Other Ambulatory Visit: Payer: Self-pay

## 2020-12-31 DIAGNOSIS — D0512 Intraductal carcinoma in situ of left breast: Secondary | ICD-10-CM | POA: Diagnosis not present

## 2020-12-31 DIAGNOSIS — Z510 Encounter for antineoplastic radiation therapy: Secondary | ICD-10-CM | POA: Diagnosis not present

## 2020-12-31 DIAGNOSIS — C50411 Malignant neoplasm of upper-outer quadrant of right female breast: Secondary | ICD-10-CM

## 2020-12-31 DIAGNOSIS — Z170 Estrogen receptor positive status [ER+]: Secondary | ICD-10-CM | POA: Diagnosis not present

## 2020-12-31 MED ORDER — SONAFINE EX EMUL
1.0000 "application " | Freq: Two times a day (BID) | CUTANEOUS | Status: DC
  Administered 2020-12-31: 1 via TOPICAL

## 2021-01-01 ENCOUNTER — Ambulatory Visit
Admission: RE | Admit: 2021-01-01 | Discharge: 2021-01-01 | Disposition: A | Discharge status: Home / Self Care | Payer: Medicare HMO | Source: Ambulatory Visit | Attending: Radiation Oncology | Admitting: Radiation Oncology

## 2021-01-01 ENCOUNTER — Ambulatory Visit: Payer: Medicare HMO

## 2021-01-01 DIAGNOSIS — R600 Localized edema: Secondary | ICD-10-CM | POA: Diagnosis not present

## 2021-01-01 DIAGNOSIS — Z483 Aftercare following surgery for neoplasm: Secondary | ICD-10-CM

## 2021-01-01 DIAGNOSIS — D0512 Intraductal carcinoma in situ of left breast: Secondary | ICD-10-CM

## 2021-01-01 DIAGNOSIS — C50411 Malignant neoplasm of upper-outer quadrant of right female breast: Secondary | ICD-10-CM

## 2021-01-01 DIAGNOSIS — M25611 Stiffness of right shoulder, not elsewhere classified: Secondary | ICD-10-CM

## 2021-01-01 DIAGNOSIS — M25612 Stiffness of left shoulder, not elsewhere classified: Secondary | ICD-10-CM | POA: Diagnosis not present

## 2021-01-01 DIAGNOSIS — R293 Abnormal posture: Secondary | ICD-10-CM | POA: Diagnosis not present

## 2021-01-01 DIAGNOSIS — Z170 Estrogen receptor positive status [ER+]: Secondary | ICD-10-CM

## 2021-01-01 DIAGNOSIS — M6281 Muscle weakness (generalized): Secondary | ICD-10-CM

## 2021-01-01 DIAGNOSIS — Z510 Encounter for antineoplastic radiation therapy: Secondary | ICD-10-CM | POA: Diagnosis not present

## 2021-01-01 NOTE — Therapy (Signed)
[place], [place], [address] Phone: [phone]   Fax:  [phone]  Physical Therapy Treatment  Patient Details  Name: [name] MRN: [id number] Date of Birth: November 14, 1955 Referring Provider (PT): Isidore Moos   Encounter Date: 01/01/2021   PT End of Session - 01/01/21 1349     Visit Number 5    Number of Visits 12    Date for PT Re-Evaluation 01/24/21    PT Start Time [id number]    PT Stop Time 1345    PT Time Calculation (min) 43 min    Activity Tolerance Patient tolerated treatment well    Behavior During Therapy [facility] for tasks assessed/performed             Past Medical History:  Diagnosis Date   Anal fissure    Atrial septal aneurysm / if pfo  echo 6 13  10/23/2011   Breast cancer ([place])    Cataract    both eyes   Depression    Fatty liver    "pre fatty liver"   Fibromyalgia    Headache(784.0)    hx of migraines when younger   Heart palpitations    hx with normal holter event monitoring   Hx: UTI (urinary tract infection)    Hyperlipidemia    Hypertension    Hypothyroidism    Obesity    Pneumonia 1972   hx of   Polyp of colon    Serrated adenoma of colon 08/2012   Skin cancer    basal, squamous cell   Sleep apnea    uses cpap    Past Surgical History:  Procedure Laterality Date   BREAST LUMPECTOMY WITH RADIOACTIVE SEED AND SENTINEL LYMPH NODE BIOPSY Right 07/26/2020   Procedure: RIGHT BREAST LUMPECTOMY WITH RADIOACTIVE SEED AND SENTINEL LYMPH NODE BIOPSY;  Surgeon: Jovita Kussmaul, MD;  Location: [facility];  Service: General;  Laterality: Right;   BREAST LUMPECTOMY WITH RADIOACTIVE SEED LOCALIZATION Left 07/26/2020   Procedure: LEFT BREAST LUMPECTOMY X 2  WITH RADIOACTIVE SEED LOCALIZATION;  Surgeon: Jovita Kussmaul, MD;  Location: [facility];  Service: General;  Laterality: Left;   COLONOSCOPY     DILATION AND CURETTAGE OF [place]  ARTHROSCOPY     both in past   POLYPECTOMY     [place] Left 07/07/2013   Procedure: LEFT SHOULDER MINI OPEN SUBACROMIAL DECOMPRESSION ROTATOR CUFF REPAIR AND POSSIBLE PATCH GRAFT ;  Surgeon: Johnn Hai, MD;  Location: [facility];  Service: Orthopedics;  Laterality: Left;  with interscaline block   SKIN CANCER EXCISION Bilateral    arm, legs, and chest   TONSILLECTOMY      There were no vitals filed for this visit.   Subjective Assessment - 01/01/21 1300     Subjective I have been so tired this past week that I havent got much done.  I have developed a rash from the radiation.  I went by and got the compression bra but I dont have it on.My breasts are sore and red. I have 9 more visits on the right and left is 4 times.    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 She underwent a left lumpectomy for DCIS and a right lumpectomy and sentinel node biopsy on 07/26/2020. She had 2 lymph nodes removed with 1 being positive for cancer  on the right side. It is ER/PR positive. Left shoulder rotator cuff repair 06/2013.    Patient Stated Goals Learn MLD, strengthen legs so legs don't feel so fatigued, sit to stand    Currently in Pain? No/denies    Pain Score 0-No pain                               [facility] Adult PT Treatment/Exercise - 01/01/21 0001       Knee/Hip Exercises: Seated   Other Seated Knee/Hip Exercises Hip IR and ER review in smaller ROM without pain    Abd/Adduction Weights --   cone touches with cone on counter/table x 2 ea     Shoulder Exercises: Supine   Horizontal ABduction Strengthening;Both;10 reps    Theraband Level (Shoulder Horizontal ABduction) Level 1 (Yellow)    External Rotation Strengthening;Both;10 reps    Theraband Level (Shoulder External Rotation) Level 1 (Yellow)    Flexion Strengthening;Both;10 reps    Theraband Level (Shoulder Flexion) Level  1 (Yellow)    Diagonals Strengthening;Right;Left;5 reps    Theraband Level (Shoulder Diagonals) Level 1 (Yellow)      Manual Therapy   Manual therapy comments assessed skin- minimal swelling noted bilateral breasts but significant rash at right neck and right and left chest and right upper back from radiation                    PT Education - 01/01/21 1349     Education Details pt was edcated in cone touches at counter and educated about moving cone to chair as it gets easier.    Person(s) Educated Patient    Methods Explanation;Demonstration    Comprehension Returned demonstration                 PT Long Term Goals - 12/13/20 1405       PT LONG TERM GOAL #1   Title Pt will be independent in a HEP for  UE and LE strength/ROM, balance    Time 6    Period Weeks    Status New    Target Date 01/24/21      PT LONG TERM GOAL #2   Title Pt will be independent in self MLD    Time 6    Period Weeks    Status New    Target Date 01/24/21      PT LONG TERM GOAL #3   Title Pt will be able to perform 11 sit to stands in 30 sec for improved function and decreased fall risk    Time 6    Period Weeks    Status New    Target Date 01/24/21      PT LONG TERM GOAL #4   Title Pt will be able to increase walking time to 20-30 minutes daily, not necessarily at 1 time.    Baseline 10 min    Time 6    Period Weeks    Target Date 01/24/21                   Plan - 01/01/21 1350     Clinical Impression Statement Pt came in and reported increased areas of rash.  Skin was inspected and rash was very prevalent at bilateral breast, right neck/chest and right upper back.  We decided to hold on MLD for several weeks until rash improves.  We reviewed scapular stab exs from last visit  and added shoulder flexion and diagonals with yellow band to HEP. (pics given previously) Pt also notes extensive fatigue from radiation.  Showed her and practiced cone touches in SLS at  counter and at lower table and updated HEP.  Pt reported knee pain with hip IR and ER but she was doing improperly and was able to do in smaller ROM without pain. Also advised to start with yellow band prn.   Personal Factors and Comorbidities Age    Comorbidities Bilateral breast CA with Right SLNB, radiation,chemo, FMS,hypothyroidism    Stability/Clinical Decision Making Stable/Uncomplicated    Rehab Potential Excellent    PT Frequency 2x / week    PT Duration 6 weeks    PT Treatment/Interventions ADLs/Self Care Home Management;Therapeutic exercise;Patient/family education;Neuromuscular re-education;Balance training;Manual techniques;Manual lymph drainage;Passive range of motion    PT Next Visit Plan check rash/radiation discomfort, resume MLD and pt instruction especially for right breast , LE strength/balance   PT Home Exercise Plan piriformis stretch, sitting hip  IR and ER with red band, standing hip abd with red, supine clam with ppt and red band, supine scapular series, cone touches    Consulted and Agree with Plan of Care Patient             Patient will benefit from skilled therapeutic intervention in order to improve the following deficits and impairments:  Decreased range of motion, Decreased knowledge of precautions, Pain, Impaired UE functional use, Increased edema  Visit Diagnosis: Ductal carcinoma in situ (DCIS) of left breast  Aftercare following surgery for neoplasm  Localized edema  Abnormal posture  Muscle weakness (generalized)  Stiffness of right shoulder, not elsewhere classified  Stiffness of left shoulder, not elsewhere classified  Malignant neoplasm of upper-outer quadrant of right breast in female, estrogen receptor positive [facility])     Problem List Patient Active Problem List   Diagnosis Date Noted   Malignant neoplasm of upper-outer quadrant of right breast in female, estrogen receptor positive ([place]) 06/25/2020   Ductal carcinoma in situ (DCIS)  of left breast 06/25/2020   Hepatic steatosis 11/27/2014   Hx of adenomatous colonic polyps 11/27/2014   Visit for preventive health examination 03/28/2014   Hyperlipidemia 03/28/2014   Left rotator cuff tear 07/07/2013   Tear of left rotator cuff 07/07/2013   Rhinitis 06/01/2013   Chest wall deformity 04/12/2012   Atrial septal aneurysm / if pfo  echo 6 13  10/23/2011   OSA (obstructive sleep apnea) 11/27/2010   Dyslipidemia 11/01/2010   Flushing 08/29/2010   Labile hypertension 08/29/2010   MUSCLE CRAMPS, FOOT 06/10/2010   OTHER SLEEP DISTURBANCES 06/10/2010   ANXIETY, SITUATIONAL 05/08/2010   VERTIGO, POSITIONAL 03/15/2010   VITAMIN D DEFICIENCY 02/18/2010   OBESITY 02/18/2010   ALLERGIC RHINITIS 02/18/2010   PLANTAR FASCIITIS 02/18/2010   TWITCHING 06/11/2009   NUMBNESS, HAND 06/11/2009   CERVICAL STRAIN, ACUTE 06/11/2009   OTHER MALAISE AND FATIGUE 07/09/2007   HYPERTENSION 01/28/2007   Hypothyroidism 11/11/2006   COMMON MIGRAINE 11/11/2006   GERD 11/11/2006   FIBROMYALGIA 11/11/2006    Claris Pong 01/01/2021, 1:55 PM  [place], [place], [address] Phone: [phone]   Fax:  [phone]  Name: [name] MRN: [id number] Date of Birth: 09-16-1955 Cheral Almas, PT 01/01/21 1:58 PM

## 2021-01-02 ENCOUNTER — Other Ambulatory Visit: Payer: Self-pay

## 2021-01-02 ENCOUNTER — Ambulatory Visit
Admission: RE | Admit: 2021-01-02 | Discharge: 2021-01-02 | Disposition: A | Discharge status: Home / Self Care | Payer: Medicare HMO | Source: Ambulatory Visit | Attending: Radiation Oncology | Admitting: Radiation Oncology

## 2021-01-02 DIAGNOSIS — Z170 Estrogen receptor positive status [ER+]: Secondary | ICD-10-CM | POA: Diagnosis not present

## 2021-01-02 DIAGNOSIS — Z510 Encounter for antineoplastic radiation therapy: Secondary | ICD-10-CM | POA: Diagnosis not present

## 2021-01-02 DIAGNOSIS — C50411 Malignant neoplasm of upper-outer quadrant of right female breast: Secondary | ICD-10-CM | POA: Diagnosis not present

## 2021-01-02 DIAGNOSIS — D0512 Intraductal carcinoma in situ of left breast: Secondary | ICD-10-CM | POA: Diagnosis not present

## 2021-01-03 ENCOUNTER — Ambulatory Visit
Admission: RE | Admit: 2021-01-03 | Discharge: 2021-01-03 | Disposition: A | Discharge status: Home / Self Care | Payer: Medicare HMO | Source: Ambulatory Visit | Attending: Radiation Oncology | Admitting: Radiation Oncology

## 2021-01-03 ENCOUNTER — Ambulatory Visit: Payer: Medicare HMO

## 2021-01-03 DIAGNOSIS — Z510 Encounter for antineoplastic radiation therapy: Secondary | ICD-10-CM | POA: Diagnosis not present

## 2021-01-03 DIAGNOSIS — Z170 Estrogen receptor positive status [ER+]: Secondary | ICD-10-CM | POA: Diagnosis not present

## 2021-01-03 DIAGNOSIS — C50411 Malignant neoplasm of upper-outer quadrant of right female breast: Secondary | ICD-10-CM | POA: Diagnosis not present

## 2021-01-03 DIAGNOSIS — D0512 Intraductal carcinoma in situ of left breast: Secondary | ICD-10-CM | POA: Diagnosis not present

## 2021-01-04 ENCOUNTER — Ambulatory Visit
Admission: RE | Admit: 2021-01-04 | Discharge: 2021-01-04 | Disposition: A | Discharge status: Home / Self Care | Payer: Medicare HMO | Source: Ambulatory Visit | Attending: Radiation Oncology | Admitting: Radiation Oncology

## 2021-01-04 ENCOUNTER — Other Ambulatory Visit: Payer: Self-pay

## 2021-01-04 ENCOUNTER — Ambulatory Visit: Payer: Medicare HMO | Admitting: Radiation Oncology

## 2021-01-04 DIAGNOSIS — E039 Hypothyroidism, unspecified: Secondary | ICD-10-CM | POA: Diagnosis not present

## 2021-01-04 DIAGNOSIS — C50411 Malignant neoplasm of upper-outer quadrant of right female breast: Secondary | ICD-10-CM | POA: Diagnosis not present

## 2021-01-04 DIAGNOSIS — C50919 Malignant neoplasm of unspecified site of unspecified female breast: Secondary | ICD-10-CM | POA: Diagnosis not present

## 2021-01-04 DIAGNOSIS — Z170 Estrogen receptor positive status [ER+]: Secondary | ICD-10-CM | POA: Diagnosis not present

## 2021-01-04 DIAGNOSIS — Z510 Encounter for antineoplastic radiation therapy: Secondary | ICD-10-CM | POA: Diagnosis not present

## 2021-01-04 DIAGNOSIS — D0512 Intraductal carcinoma in situ of left breast: Secondary | ICD-10-CM | POA: Diagnosis not present

## 2021-01-04 DIAGNOSIS — R5381 Other malaise: Secondary | ICD-10-CM | POA: Diagnosis not present

## 2021-01-07 ENCOUNTER — Ambulatory Visit: Payer: Medicare HMO

## 2021-01-07 ENCOUNTER — Ambulatory Visit: Payer: Medicare HMO | Admitting: Radiation Oncology

## 2021-01-07 ENCOUNTER — Other Ambulatory Visit: Payer: Self-pay

## 2021-01-07 DIAGNOSIS — Z170 Estrogen receptor positive status [ER+]: Secondary | ICD-10-CM | POA: Diagnosis not present

## 2021-01-07 DIAGNOSIS — C50411 Malignant neoplasm of upper-outer quadrant of right female breast: Secondary | ICD-10-CM | POA: Diagnosis not present

## 2021-01-07 DIAGNOSIS — M9902 Segmental and somatic dysfunction of thoracic region: Secondary | ICD-10-CM | POA: Diagnosis not present

## 2021-01-07 DIAGNOSIS — M531 Cervicobrachial syndrome: Secondary | ICD-10-CM | POA: Diagnosis not present

## 2021-01-07 DIAGNOSIS — Z510 Encounter for antineoplastic radiation therapy: Secondary | ICD-10-CM | POA: Diagnosis not present

## 2021-01-07 DIAGNOSIS — M9903 Segmental and somatic dysfunction of lumbar region: Secondary | ICD-10-CM | POA: Diagnosis not present

## 2021-01-07 DIAGNOSIS — M9901 Segmental and somatic dysfunction of cervical region: Secondary | ICD-10-CM | POA: Diagnosis not present

## 2021-01-07 DIAGNOSIS — D0512 Intraductal carcinoma in situ of left breast: Secondary | ICD-10-CM | POA: Diagnosis not present

## 2021-01-08 ENCOUNTER — Ambulatory Visit: Payer: Medicare HMO

## 2021-01-08 DIAGNOSIS — D0512 Intraductal carcinoma in situ of left breast: Secondary | ICD-10-CM | POA: Diagnosis not present

## 2021-01-08 DIAGNOSIS — Z510 Encounter for antineoplastic radiation therapy: Secondary | ICD-10-CM | POA: Diagnosis not present

## 2021-01-08 DIAGNOSIS — C50411 Malignant neoplasm of upper-outer quadrant of right female breast: Secondary | ICD-10-CM | POA: Diagnosis not present

## 2021-01-08 DIAGNOSIS — Z170 Estrogen receptor positive status [ER+]: Secondary | ICD-10-CM | POA: Diagnosis not present

## 2021-01-09 ENCOUNTER — Other Ambulatory Visit: Payer: Self-pay

## 2021-01-09 ENCOUNTER — Ambulatory Visit: Payer: Medicare HMO

## 2021-01-09 ENCOUNTER — Ambulatory Visit
Admission: RE | Admit: 2021-01-09 | Discharge: 2021-01-09 | Disposition: A | Discharge status: Home / Self Care | Payer: Medicare HMO | Source: Ambulatory Visit | Attending: Radiation Oncology | Admitting: Radiation Oncology

## 2021-01-09 DIAGNOSIS — R69 Illness, unspecified: Secondary | ICD-10-CM | POA: Diagnosis not present

## 2021-01-09 DIAGNOSIS — Z170 Estrogen receptor positive status [ER+]: Secondary | ICD-10-CM | POA: Diagnosis not present

## 2021-01-09 DIAGNOSIS — D0512 Intraductal carcinoma in situ of left breast: Secondary | ICD-10-CM | POA: Diagnosis not present

## 2021-01-09 DIAGNOSIS — C50411 Malignant neoplasm of upper-outer quadrant of right female breast: Secondary | ICD-10-CM | POA: Diagnosis not present

## 2021-01-09 DIAGNOSIS — Z510 Encounter for antineoplastic radiation therapy: Secondary | ICD-10-CM | POA: Diagnosis not present

## 2021-01-10 ENCOUNTER — Encounter: Payer: Self-pay | Admitting: Hematology

## 2021-01-10 ENCOUNTER — Ambulatory Visit
Admission: RE | Admit: 2021-01-10 | Discharge: 2021-01-10 | Disposition: A | Discharge status: Home / Self Care | Payer: Medicare HMO | Source: Ambulatory Visit | Attending: Radiation Oncology | Admitting: Radiation Oncology

## 2021-01-10 ENCOUNTER — Inpatient Hospital Stay: Payer: Medicare HMO | Attending: Hematology | Admitting: Hematology

## 2021-01-10 ENCOUNTER — Other Ambulatory Visit: Payer: Self-pay | Admitting: Internal Medicine

## 2021-01-10 ENCOUNTER — Inpatient Hospital Stay: Payer: Medicare HMO

## 2021-01-10 VITALS — BP 151/74 | HR 76 | Temp 98.5°F | Resp 18 | Ht 66.0 in | Wt 204.9 lb

## 2021-01-10 DIAGNOSIS — R5383 Other fatigue: Secondary | ICD-10-CM | POA: Insufficient documentation

## 2021-01-10 DIAGNOSIS — Z79899 Other long term (current) drug therapy: Secondary | ICD-10-CM | POA: Insufficient documentation

## 2021-01-10 DIAGNOSIS — E039 Hypothyroidism, unspecified: Secondary | ICD-10-CM | POA: Diagnosis not present

## 2021-01-10 DIAGNOSIS — Z510 Encounter for antineoplastic radiation therapy: Secondary | ICD-10-CM | POA: Diagnosis not present

## 2021-01-10 DIAGNOSIS — G629 Polyneuropathy, unspecified: Secondary | ICD-10-CM | POA: Diagnosis not present

## 2021-01-10 DIAGNOSIS — Z7952 Long term (current) use of systemic steroids: Secondary | ICD-10-CM | POA: Diagnosis not present

## 2021-01-10 DIAGNOSIS — Z85828 Personal history of other malignant neoplasm of skin: Secondary | ICD-10-CM | POA: Diagnosis not present

## 2021-01-10 DIAGNOSIS — C50411 Malignant neoplasm of upper-outer quadrant of right female breast: Secondary | ICD-10-CM | POA: Insufficient documentation

## 2021-01-10 DIAGNOSIS — K760 Fatty (change of) liver, not elsewhere classified: Secondary | ICD-10-CM | POA: Diagnosis not present

## 2021-01-10 DIAGNOSIS — Z170 Estrogen receptor positive status [ER+]: Secondary | ICD-10-CM | POA: Insufficient documentation

## 2021-01-10 DIAGNOSIS — D0512 Intraductal carcinoma in situ of left breast: Secondary | ICD-10-CM | POA: Insufficient documentation

## 2021-01-10 DIAGNOSIS — I10 Essential (primary) hypertension: Secondary | ICD-10-CM | POA: Insufficient documentation

## 2021-01-10 DIAGNOSIS — Z86010 Personal history of colonic polyps: Secondary | ICD-10-CM | POA: Insufficient documentation

## 2021-01-10 DIAGNOSIS — G4730 Sleep apnea, unspecified: Secondary | ICD-10-CM | POA: Diagnosis not present

## 2021-01-10 DIAGNOSIS — Z87440 Personal history of urinary (tract) infections: Secondary | ICD-10-CM | POA: Insufficient documentation

## 2021-01-10 DIAGNOSIS — Z79811 Long term (current) use of aromatase inhibitors: Secondary | ICD-10-CM | POA: Insufficient documentation

## 2021-01-10 DIAGNOSIS — M797 Fibromyalgia: Secondary | ICD-10-CM | POA: Diagnosis not present

## 2021-01-10 DIAGNOSIS — E785 Hyperlipidemia, unspecified: Secondary | ICD-10-CM | POA: Insufficient documentation

## 2021-01-10 LAB — CBC WITH DIFFERENTIAL (CANCER CENTER ONLY)
Abs Immature Granulocytes: 0.01 10*3/uL (ref 0.00–0.07)
Basophils Absolute: 0 10*3/uL (ref 0.0–0.1)
Basophils Relative: 1 %
Eosinophils Absolute: 0.1 10*3/uL (ref 0.0–0.5)
Eosinophils Relative: 2 %
HCT: 37.1 % (ref 36.0–46.0)
Hemoglobin: 12.8 g/dL (ref 12.0–15.0)
Immature Granulocytes: 0 %
Lymphocytes Relative: 16 %
Lymphs Abs: 0.8 10*3/uL (ref 0.7–4.0)
MCH: 30.9 pg (ref 26.0–34.0)
MCHC: 34.5 g/dL (ref 30.0–36.0)
MCV: 89.6 fL (ref 80.0–100.0)
Monocytes Absolute: 0.5 10*3/uL (ref 0.1–1.0)
Monocytes Relative: 9 %
Neutro Abs: 3.8 10*3/uL (ref 1.7–7.7)
Neutrophils Relative %: 72 %
Platelet Count: 156 10*3/uL (ref 150–400)
RBC: 4.14 MIL/uL (ref 3.87–5.11)
RDW: 13.7 % (ref 11.5–15.5)
WBC Count: 5.3 10*3/uL (ref 4.0–10.5)
nRBC: 0 % (ref 0.0–0.2)

## 2021-01-10 LAB — CMP (CANCER CENTER ONLY)
ALT: 47 U/L — ABNORMAL HIGH (ref 0–44)
AST: 26 U/L (ref 15–41)
Albumin: 3.9 g/dL (ref 3.5–5.0)
Alkaline Phosphatase: 110 U/L (ref 38–126)
Anion gap: 9 (ref 5–15)
BUN: 12 mg/dL (ref 8–23)
CO2: 26 mmol/L (ref 22–32)
Calcium: 9.5 mg/dL (ref 8.9–10.3)
Chloride: 105 mmol/L (ref 98–111)
Creatinine: 0.69 mg/dL (ref 0.44–1.00)
GFR, Estimated: >60 mL/min (ref >60)
Glucose, Bld: 90 mg/dL (ref 70–99)
Potassium: 3.6 mmol/L (ref 3.5–5.1)
Sodium: 140 mmol/L (ref 135–145)
Total Bilirubin: 0.7 mg/dL (ref 0.3–1.2)
Total Protein: 6.8 g/dL (ref 6.5–8.1)

## 2021-01-10 MED ORDER — EXEMESTANE 25 MG PO TABS: 25.0000 mg | ORAL_TABLET | Freq: Every day | ORAL | 3 refills | Status: DC

## 2021-01-10 NOTE — Progress Notes (Signed)
[place]   [phone] [phone]   Clinic Follow up Note   Patient Care Team: Panosh, Standley Brooking, MD as PCP - General Arvella Nigh, MD as Consulting Physician (Obstetrics and Gynecology) Dr Peggye Form, MD as Consulting Physician (Orthopedic Surgery) Ladene Artist, MD as Consulting Physician (Gastroenterology) Melina Schools, MD as Consulting Physician (Orthopedic Surgery) Rockwell Germany, RN as Oncology Nurse Navigator Mauro Kaufmann, RN as Oncology Nurse Navigator Jovita Kussmaul, MD as Consulting Physician (General Surgery) Truitt Merle, MD as Consulting Physician (Hematology) Eppie Gibson, MD as Attending Physician (Radiation Oncology) Jari Pigg, MD as Consulting Physician (Dermatology) Serita Kyle, MD as Referring Physician (Cardiology)  Date of Service:  01/10/2021  CHIEF COMPLAINT: f/u of right breast cancer and left breast DCIS  CURRENT THERAPY:  Adjuvant radiation therapy  ASSESSMENT & PLAN:  [name] is a 65 y.o. female with   1. 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  -She underwent B/l lumpectomy and right SNLB by Dr Marlou Starks on 07/26/20. Her surgical path showed: left breast with no residual DCIS on path; right breast with grade II, 1.5cm invasive ductal carcinoma, with clear margins and 1/2 positive LNs. -Mammaprint was done and results show high risk of Luminal Type B with 29% risk of distant recurrence.  -She completed adjuvant chemo with TC.  Due to allergy reaction to docetaxel, it was changed to Abraxane from cycle 3. -She is receiving adjuvant radiation -Given the strong ER and PR positivity, I do recommend adjuvant aromatase inhibitor to reduce her risk of cancer recurrence,  The potential benefit and side effects, which includes but not limited to,  hot flash, skin and vaginal dryness, metabolic changes (increased blood glucose, cholesterol, weight, etc.), slightly in increased risk of cardiovascular disease, cataracts, muscular and joint discomfort, osteopenia and osteoporosis, etc, were discussed with her in great details. She is interested, and she'll start later this month after recovering from radiation.  -I prescribed exemestane today.    2. Bone Health -she reports her last DEXA was in 03/2019, showing osteopenia. This was done at Dr. Sherran Needs office, so I advised her to have repeat done there.   3. Comorbidities: Depression, Fibromyalgia, HLD, HTN, Hypothyroidism, H/o Skin cancer (squamous and basal cell), sleep apnea -She notes having Adrenal fatigue. I discussed based on her labs, this is not consistent with adrenal insufficiency. I recommend she see endocrinologist -She is followed for HTN. She takes her BP at home. The bottom number is always low, and the top number goes to normal when she lays down. -On medications, continue to f/u with her other physicians.    4.  Fatty liver and transaminitis -She has chronic intermittent transaminitis, which she contributes to fatty liver.  She is on low sugar diet and drinks a lot of water -Liver morphology was unremarkable on recent CT scan -She does not drink alcohol, she knows to avoid Tylenol. -Her LFTs fluctuate.     PLAN:  -continue daily radiation, scheduled to finish 01/15/21 -start exemestane in about 3 weeks  -survivorship in 3 months -labs and f/u in 6 months   No problem-specific Assessment & Plan notes found for this encounter.   SUMMARY OF ONCOLOGIC HISTORY: Oncology History Overview Note  Cancer Staging Ductal carcinoma in situ (DCIS) of left breast Staging form: Breast, AJCC 8th Edition -  Clinical stage from 06/21/2020: Stage 0 (cTis (DCIS), cN0, cM0, G2, ER+, PR+, HER2: Not Assessed) - Signed by Truitt Merle, MD on 06/26/2020 Stage prefix: Initial  diagnosis  Malignant neoplasm of upper-outer quadrant of right breast in female, estrogen receptor positive ([place]) Staging form: Breast, AJCC 8th Edition - Clinical stage from 06/21/2020: Stage IA (cT1b, cN0, cM0, G2, ER+, PR+, HER2-) - Signed by Truitt Merle, MD on 06/26/2020 Stage prefix: Initial diagnosis - Pathologic stage from 07/26/2020: Stage IA (pT1c, pN1a, cM0, G2, ER+, PR+, HER2-) - Signed by Truitt Merle, MD on 08/09/2020 Stage prefix: Initial diagnosis Nuclear grade: G2 Histologic grading system: 3 grade system Residual tumor (R): R0 - None    Malignant neoplasm of upper-outer quadrant of right breast in female, estrogen receptor positive ([place])  06/08/2020 Mammogram   IMPRESSION: 1. 9 x 7 x 6 mm mass in the 12 o'clock position of the right breast, 2cmfn with imaging features highly suspicious for malignancy. 2. 4 mm group of indeterminate calcifications in the 12 o'clock position of the left breast and 4 mm group of indeterminate calcifications in the 1 o'clock position of the left breast. Together, the groups span an area measuring 3.9 cm.   06/21/2020 Cancer Staging   Staging form: Breast, AJCC 8th Edition - Clinical stage from 06/21/2020: Stage IA (cT1b, cN0, cM0, G2, ER+, PR+, HER2-) - Signed by Truitt Merle, MD on 06/26/2020 Stage prefix: Initial diagnosis   06/21/2020 Initial Biopsy   Diagnosis 1. Breast, right, needle core biopsy, 12 oc - INVASIVE MAMMARY CARCINOMA - MAMMARY CARCINOMA IN SITU - SEE COMMENT 2. Breast, left, needle core biopsy, 12 oc - MAMMARY CARCINOMA IN-SITU WITH NECROSIS AND CALCIFICATIONS - SEE COMMENT 3. Breast, left, needle core biopsy, 1 oc - MAMMARY CARCINOMA IN-SITU WITH NECROSIS AND CALCIFICATIONS - SEE COMMENT Microscopic Comment 1. The biopsy material shows an infiltrative proliferation of cells with large vesicular nuclei with inconspicuous nucleoli, arranged linearly and in small clusters. Based on the biopsy, the carcinoma appears Nottingham  grade 2 of 3.  Addendum: 1. E-cadherin is POSITIVE supporting a ductal origin. 2. E-cadherin is POSITIVE supporting a ductal origin.  3. E-cadherin is positive supporting a ductal origin. The focus is less pronounced on the deeper sections and in isolation would likely be considered atypical ductal hyperplasia.   06/21/2020 Receptors her2   1. PROGNOSTIC INDICATORS Results: IMMUNOHISTOCHEMICAL AND MORPHOMETRIC ANALYSIS PERFORMED MANUALLY The tumor cells are EQUIVOCAL for Her2 (2+). HER2 by FISH will be performed and the results reported separately Estrogen Receptor: 95%, POSITIVE, STRONG STAINING INTENSITY Progesterone Receptor: 40%, POSITIVE, MODERATE STAINING INTENSITY Proliferation Marker Ki67: 10%  1. FLUORESCENCE IN-SITU HYBRIDIZATION Results: GROUP 5: HER2 **NEGATIVE** Equivocal form of amplification of the HER2 gene was detected in the IHC 2+ tissue sample received from this individual. HER2 FISH was performed by a technologist and cell imaging and analysis on the [employer].   06/25/2020 Initial Diagnosis   Malignant neoplasm of upper-outer quadrant of right breast in female, estrogen receptor positive ([place])   07/03/2020 Breast MRI   IMPRESSION: 1. Known RIGHT breast cancer, 12 o'clock axis, at anterior depth, measuring 1 cm greatest extent, manifesting as a spiculated enhancing mass on MRI, with associated biopsy clip. Expected post biopsy changes are seen within the adjacent outer RIGHT breast. 2. No evidence of additional multifocal or multicentric disease within the RIGHT breast. 3. Known LEFT breast DCIS within the slightly outer LEFT breast, at anterior depth, corresponding to the biopsy site labeled 1 o'clock axis, with  associated enhancement only at the margins of the biopsy cavity measuring up to 5 mm greatest dimension. 4. Known LEFT breast DCIS within the upper central LEFT breast, at middle depth, corresponding to the biopsy site labeled 12 o'clock axis. Contiguous  linear non-mass enhancement extends 2.3 cm superior-medial to the biopsy cavity, most likely post biopsy change but possibly contiguous extent of disease. 5. No evidence of additional multifocal or multicentric disease within the LEFT breast. 6. No evidence of metastatic lymphadenopathy.     07/26/2020 Cancer Staging   Staging form: Breast, AJCC 8th Edition - Pathologic stage from 07/26/2020: Stage IA (pT1c, pN1a, cM0, G2, ER+, PR+, HER2-) - Signed by Truitt Merle, MD on 08/09/2020 Stage prefix: Initial diagnosis Nuclear grade: G2 Histologic grading system: 3 grade system Residual tumor (R): R0 - None   07/26/2020 Surgery   RIGHT BREAST LUMPECTOMY WITH RADIOACTIVE SEED AND SENTINEL LYMPH NODE BIOPSY by Dr Marlou Starks   07/26/2020 Pathology Results   FINAL MICROSCOPIC DIAGNOSIS:   A. LYMPH NODE, RIGHT AXILLARY #1, SENTINEL, EXCISION:  - Lymph node, negative for carcinoma (0/1)   B. LYMPH NODE, RIGHT AXILLARY, SENTINEL, EXCISION:  - Benign fibroadipose tissue, negative for carcinoma   C. BREAST, RIGHT, LUMPECTOMY:  - Invasive ductal carcinoma, 1.5 cm, grade 2  - Ductal carcinoma in situ, low grade  - Resection margins are negative for carcinoma; closest is the anterior margin of 0.2 cm  - Biopsy site changes  - See oncology table   D. BREAST, LEFT, LUMPECTOMY:  - Benign breast parenchyma with prominent biopsy-related changes  - Negative for residual ductal carcinoma in situ  - See oncology table   E. LYMPH NODE, RIGHT AXILLARY #2, SENTINEL, EXCISION:  - Invasive ductal carcinoma, see comment  COMMENT:  E.  Lymph node tissue is not identified.  Findings likely represent an entirely replaced lymph node with foci of extranodal extension.    07/26/2020 Miscellaneous   Mammaprint High Risk of Luminla Type B 29% risk of recurrence in 10 years if untreated.  Her Mammaprint index is -0.175 She has 94.6% benefit of chemotherapy and hormaonal therapy.      08/20/2020 Imaging   CT  C/A/P IMPRESSION: 1. No definitive imaging findings to suggest metastatic disease in the chest, abdomen or pelvis. 2. Postoperative changes of bilateral lumpectomy and right axillary lymph node dissection with what appears to be a large postoperative seroma in the right axilla, as detailed above. Attention on follow-up studies is recommended to ensure the stability or regression of this collection. 3. Additional incidental findings, as above.   08/28/2020 Imaging   Bone Scan IMPRESSION: No definite scintigraphic evidence of osseous metastases.   08/30/2020 - 11/02/2020 Chemotherapy   Adjuvant Docetaxel and Cytoxan q3weeks for 4 cycles starting 08/30/20. Given skin rash, changes taxol to Abraxane starting with C2 (09/21/20). Completed 11/02/20.    12/03/2020 - 01/15/2021 Radiation Therapy   Bilateral breast radiation and right regional lymph nodes   Ductal carcinoma in situ (DCIS) of left breast  06/08/2020 Mammogram   IMPRESSION: 1. 9 x 7 x 6 mm mass in the 12 o'clock position of the right breast, 2cmfn with imaging features highly suspicious for malignancy. 2. 4 mm group of indeterminate calcifications in the 12 o'clock position of the left breast and 4 mm group of indeterminate calcifications in the 1 o'clock position of the left breast. Together, the groups span an area measuring 3.9 cm.   06/21/2020 Cancer Staging   Staging form: Breast, AJCC 8th Edition -  Clinical stage from 06/21/2020: Stage 0 (cTis (DCIS), cN0, cM0, G2, ER+, PR+, HER2: Not Assessed) - Signed by Truitt Merle, MD on 06/26/2020 Stage prefix: Initial diagnosis   06/21/2020 Initial Biopsy   Diagnosis 1. Breast, right, needle core biopsy, 12 oc - INVASIVE MAMMARY CARCINOMA - MAMMARY CARCINOMA IN SITU - SEE COMMENT 2. Breast, left, needle core biopsy, 12 oc - MAMMARY CARCINOMA IN-SITU WITH NECROSIS AND CALCIFICATIONS - SEE COMMENT 3. Breast, left, needle core biopsy, 1 oc - MAMMARY CARCINOMA IN-SITU WITH NECROSIS AND  CALCIFICATIONS - SEE COMMENT Microscopic Comment 1. The biopsy material shows an infiltrative proliferation of cells with large vesicular nuclei with inconspicuous nucleoli, arranged linearly and in small clusters. Based on the biopsy, the carcinoma appears Nottingham grade 2 of 3.  Addendum: 1. E-cadherin is POSITIVE supporting a ductal origin. 2. E-cadherin is POSITIVE supporting a ductal origin.  3. E-cadherin is positive supporting a ductal origin. The focus is less pronounced on the deeper sections and in isolation would likely be considered atypical ductal hyperplasia.   06/21/2020 Receptors her2   2. PROGNOSTIC INDICATORS Results: IMMUNOHISTOCHEMICAL AND MORPHOMETRIC ANALYSIS PERFORMED MANUALLY Estrogen Receptor: 95%, POSITIVE, STRONG STAINING INTENSITY Progesterone Receptor: 30%, POSITIVE, STRONG STAINING INTENSITY   06/25/2020 Initial Diagnosis   Ductal carcinoma in situ (DCIS) of left breast   07/03/2020 Breast MRI   IMPRESSION: 1. Known RIGHT breast cancer, 12 o'clock axis, at anterior depth, measuring 1 cm greatest extent, manifesting as a spiculated enhancing mass on MRI, with associated biopsy clip. Expected post biopsy changes are seen within the adjacent outer RIGHT breast. 2. No evidence of additional multifocal or multicentric disease within the RIGHT breast. 3. Known LEFT breast DCIS within the slightly outer LEFT breast, at anterior depth, corresponding to the biopsy site labeled 1 o'clock axis, with associated enhancement only at the margins of the biopsy cavity measuring up to 5 mm greatest dimension. 4. Known LEFT breast DCIS within the upper central LEFT breast, at middle depth, corresponding to the biopsy site labeled 12 o'clock axis. Contiguous linear non-mass enhancement extends 2.3 cm superior-medial to the biopsy cavity, most likely post biopsy change but possibly contiguous extent of disease. 5. No evidence of additional multifocal or multicentric  disease within the LEFT breast. 6. No evidence of metastatic lymphadenopathy.     07/26/2020 Surgery   LEFT BREAST LUMPECTOMY X 2  WITH RADIOACTIVE SEED LOCALIZATION by Dr Marlou Starks   07/26/2020 Pathology Results   FINAL MICROSCOPIC DIAGNOSIS:   A. LYMPH NODE, RIGHT AXILLARY #1, SENTINEL, EXCISION:  - Lymph node, negative for carcinoma (0/1)   B. LYMPH NODE, RIGHT AXILLARY, SENTINEL, EXCISION:  - Benign fibroadipose tissue, negative for carcinoma   C. BREAST, RIGHT, LUMPECTOMY:  - Invasive ductal carcinoma, 1.5 cm, grade 2  - Ductal carcinoma in situ, low grade  - Resection margins are negative for carcinoma; closest is the anterior margin of 0.2 cm  - Biopsy site changes  - See oncology table   D. BREAST, LEFT, LUMPECTOMY:  - Benign breast parenchyma with prominent biopsy-related changes  - Negative for residual ductal carcinoma in situ  - See oncology table   E. LYMPH NODE, RIGHT AXILLARY #2, SENTINEL, EXCISION:  - Invasive ductal carcinoma, see comment  COMMENT:  E.  Lymph node tissue is not identified.  Findings likely represent an entirely replaced lymph node with foci of extranodal extension.    12/03/2020 - 01/15/2021 Radiation Therapy   Bilateral breast radiation and right regional lymph nodes  INTERVAL HISTORY:  [name] is here for a follow up of breast cancer. She was last seen by me on 11/30/20. She presents to the clinic accompanied by her husband. She notes she is tolerating radiation relatively well. She has experienced some skin burning, as expected. 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   All other systems were reviewed with the patient and are negative.  MEDICAL HISTORY:  Past Medical History:  Diagnosis Date   Anal fissure    Atrial septal aneurysm / if pfo  echo 6 13  10/23/2011   Breast cancer ([place])    Cataract    both eyes   Depression    Fatty liver     "pre fatty liver"   Fibromyalgia    Headache(784.0)    hx of migraines when younger   Heart palpitations    hx with normal holter event monitoring   Hx: UTI (urinary tract infection)    Hyperlipidemia    Hypertension    Hypothyroidism    Obesity    Pneumonia 1972   hx of   Polyp of colon    Serrated adenoma of colon 08/2012   Skin cancer    basal, squamous cell   Sleep apnea    uses cpap    SURGICAL HISTORY: Past Surgical History:  Procedure Laterality Date   BREAST LUMPECTOMY WITH RADIOACTIVE SEED AND SENTINEL LYMPH NODE BIOPSY Right 07/26/2020   Procedure: RIGHT BREAST LUMPECTOMY WITH RADIOACTIVE SEED AND SENTINEL LYMPH NODE BIOPSY;  Surgeon: Jovita Kussmaul, MD;  Location: [facility];  Service: General;  Laterality: Right;   BREAST LUMPECTOMY WITH RADIOACTIVE SEED LOCALIZATION Left 07/26/2020   Procedure: LEFT BREAST LUMPECTOMY X 2  WITH RADIOACTIVE SEED LOCALIZATION;  Surgeon: Jovita Kussmaul, MD;  Location: [facility];  Service: General;  Laterality: Left;   COLONOSCOPY     [place] ARTHROSCOPY     both in past   POLYPECTOMY     [place] Left 07/07/2013   Procedure: LEFT SHOULDER MINI OPEN SUBACROMIAL DECOMPRESSION ROTATOR CUFF REPAIR AND POSSIBLE PATCH GRAFT ;  Surgeon: Johnn Hai, MD;  Location: [facility];  Service: Orthopedics;  Laterality: Left;  with interscaline block   SKIN CANCER EXCISION Bilateral    arm, legs, and chest   TONSILLECTOMY      I have reviewed the social history and family history with the patient and they are unchanged from previous note.  ALLERGIES:  is allergic to penicillins, taxotere [docetaxel], cefdinir, norvasc [amlodipine besylate], cetirizine, irbesartan, molds & smuts, pegfilgrastim, and tylenol [acetaminophen].  MEDICATIONS:  Current Outpatient Medications  Medication Sig Dispense Refill   exemestane (AROMASIN) 25 MG tablet Take 1 tablet (25 mg  total) by mouth daily after breakfast. 30 tablet 3   Ascorbic Acid (VITAMIN C) 1000 MG tablet Take 1,000 mg by mouth daily.     b complex vitamins capsule Take 1 capsule by mouth daily.     Bioflavonoid Products (QUERCETIN COMPLEX IMMUNE PO) Take 1 capsule by mouth daily.     Cholecalciferol (VITAMIN D) 50 MCG (2000 UT) tablet Take 4,000 Units by mouth daily.     dexamethasone (DECADRON) 4 MG tablet Take 2 tablets (8 mg total) by mouth 2 (two) times daily. Start the day before Taxotere. Then again the day after  chemo for 3 days. (Patient not taking: Reported on 12/13/2020) 30 tablet 1   hydrochlorothiazide (HYDRODIURIL) 25 MG tablet TAKE 1 TABLET BY MOUTH EVERY DAY 90 tablet 0   hydroxychloroquine (PLAQUENIL) 200 MG tablet Take 200 mg by mouth 2 (two) times a week.     lidocaine-prilocaine (EMLA) cream Apply to affected area once (Patient not taking: Reported on 12/13/2020) 30 g 3   LORazepam (ATIVAN) 1 MG tablet Take 0.5-1 tablets (0.5-1 mg total) by mouth every 8 (eight) hours as needed for anxiety. anxiety 20 tablet 0   MAGNESIUM CARBONATE PO Take 1 tablet by mouth daily.     melatonin 5 MG TABS Take 5 mg by mouth at bedtime.     Menaquinone-7 (VITAMIN K2) 100 MCG CAPS Take 100 mcg by mouth daily.     metoprolol succinate (TOPROL XL) 25 MG 24 hr tablet TAKE ONCE A DAY AS DIRECTED (Patient taking differently: Take by mouth daily as needed (Blood pressure). TAKE ONCE A DAY AS DIRECTED) 30 tablet 1   Multiple Vitamin (MULTIVITAMIN) tablet Take 1 tablet by mouth daily.     NON FORMULARY Take 48.6 mg by mouth See admin instructions. Thyroid desiccated (procine) sr 2 capsules 5 days a week, 3 capsules 2 days per week (Tues and Fri)     OLIVE LEAF EXTRACT PO Take 1 capsule by mouth daily.     ondansetron (ZOFRAN) 8 MG tablet Take 1 tablet (8 mg total) by mouth 2 (two) times daily as needed for refractory nausea / vomiting. Start on day 3 after chemo. (Patient not taking: Reported on 12/13/2020) 30 tablet 1    OVER THE COUNTER MEDICATION Take 2 capsules by mouth in the morning and at bedtime. Ifnla Med     OVER THE COUNTER MEDICATION Take 1 capsule by mouth daily. Essential Pro     potassium chloride SA (KLOR-CON) 20 MEQ tablet Take 1 tablet by mouth once daily **Schedule appt for more refills** 90 tablet 0   Probiotic Product (PROBIOTIC PO) Take 1 capsule by mouth daily.     prochlorperazine (COMPAZINE) 10 MG tablet Take 1 tablet (10 mg total) by mouth every 6 (six) hours as needed (Nausea or vomiting). (Patient not taking: Reported on 12/13/2020) 30 tablet 1   traMADol (ULTRAM) 50 MG tablet Take 1-2 tablets (50-100 mg total) by mouth every 6 (six) hours as needed. (Patient not taking: Reported on 12/13/2020) 20 tablet 1   UBIQUINOL PO Take by mouth.     VITAMIN E PO Take 23 mg by mouth daily. (Patient not taking: Reported on 12/13/2020)     ZINC GLUCONATE PO Take 10 mg by mouth daily.     No current facility-administered medications for this visit.    PHYSICAL EXAMINATION: ECOG PERFORMANCE STATUS: 1 - Symptomatic but completely ambulatory  Vitals:   01/10/21 1140  BP: (!) 151/74  Pulse: 76  Resp: 18  Temp: 98.5 F (36.9 C)  SpO2: 98%   Wt Readings from Last 3 Encounters:  01/10/21 204 lb 14.4 oz (92.9 kg)  11/30/20 204 lb 1.6 oz (92.6 kg)  11/14/20 206 lb 8 oz (93.7 kg)     GENERAL:alert, no distress and comfortable SKIN: skin color normal, no rashes or significant lesions EYES: normal, Conjunctiva are pink and non-injected, sclera clear  NEURO: alert & oriented x 3 with fluent speech  LABORATORY DATA:  I have reviewed the data as listed CBC Latest Ref Rng & Units 01/10/2021 11/30/2020 11/02/2020  WBC 4.0 - 10.5  K/uL 5.3 7.0 6.1  Hemoglobin 12.0 - 15.0 g/dL 12.8 12.1 12.3  Hematocrit 36.0 - 46.0 % 37.1 36.2 36.4  Platelets 150 - 400 K/uL 156 194 216     CMP Latest Ref Rng & Units 01/10/2021 11/30/2020 11/02/2020  Glucose 70 - 99 mg/dL 90 95 86  BUN 8 - 23 mg/dL _0 Creatinine  0.44 - 1.00 mg/dL 0.69 0.79 0.71  Sodium 135 - 145 mmol/L 140 143 143  Potassium 3.5 - 5.1 mmol/L 3.6 3.6 4.6  Chloride 98 - 111 mmol/L 105 103 108  CO2 22 - 32 mmol/L _1 Calcium 8.9 - 10.3 mg/dL 9.5 9.7 9.5  Total Protein 6.5 - 8.1 g/dL 6.8 6.9 7.2  Total Bilirubin 0.3 - 1.2 mg/dL 0.7 0.8 0.5  Alkaline Phos 38 - 126 U/L 110 91 153(H)  AST 15 - 41 U/L 26 25 49(H)  ALT 0 - 44 U/L 47(H) 34 95(H)      RADIOGRAPHIC STUDIES: I have personally reviewed the radiological images as listed and agreed with the findings in the report. No results found.    No orders of the defined types were placed in this encounter.  All questions were answered. The patient knows to call the clinic with any problems, questions or concerns. No barriers to learning was detected. The total time spent in the appointment was 30 minutes.     Truitt Merle, MD 01/10/2021   I, Wilburn Mylar, am acting as scribe for Truitt Merle, MD.   I have reviewed the above documentation for accuracy and completeness, and I agree with the above.

## 2021-01-11 ENCOUNTER — Ambulatory Visit
Admission: RE | Admit: 2021-01-11 | Discharge: 2021-01-11 | Disposition: A | Discharge status: Home / Self Care | Payer: Medicare HMO | Source: Ambulatory Visit | Attending: Radiation Oncology | Admitting: Radiation Oncology

## 2021-01-11 ENCOUNTER — Other Ambulatory Visit: Payer: Self-pay

## 2021-01-11 ENCOUNTER — Ambulatory Visit: Payer: Medicare HMO

## 2021-01-11 DIAGNOSIS — C50411 Malignant neoplasm of upper-outer quadrant of right female breast: Secondary | ICD-10-CM | POA: Diagnosis not present

## 2021-01-11 DIAGNOSIS — Z510 Encounter for antineoplastic radiation therapy: Secondary | ICD-10-CM | POA: Diagnosis not present

## 2021-01-11 DIAGNOSIS — Z170 Estrogen receptor positive status [ER+]: Secondary | ICD-10-CM | POA: Diagnosis not present

## 2021-01-11 DIAGNOSIS — D0512 Intraductal carcinoma in situ of left breast: Secondary | ICD-10-CM | POA: Diagnosis not present

## 2021-01-14 ENCOUNTER — Ambulatory Visit: Payer: Medicare HMO

## 2021-01-15 ENCOUNTER — Encounter: Payer: Self-pay | Admitting: Radiation Oncology

## 2021-01-15 ENCOUNTER — Ambulatory Visit
Admission: RE | Admit: 2021-01-15 | Discharge: 2021-01-15 | Disposition: A | Discharge status: Home / Self Care | Payer: Medicare HMO | Source: Ambulatory Visit | Attending: Radiation Oncology | Admitting: Radiation Oncology

## 2021-01-15 ENCOUNTER — Ambulatory Visit: Payer: Medicare HMO

## 2021-01-15 ENCOUNTER — Encounter: Payer: Self-pay | Admitting: *Deleted

## 2021-01-15 DIAGNOSIS — Z170 Estrogen receptor positive status [ER+]: Secondary | ICD-10-CM | POA: Diagnosis not present

## 2021-01-15 DIAGNOSIS — C50411 Malignant neoplasm of upper-outer quadrant of right female breast: Secondary | ICD-10-CM

## 2021-01-15 DIAGNOSIS — D0512 Intraductal carcinoma in situ of left breast: Secondary | ICD-10-CM | POA: Diagnosis not present

## 2021-01-15 DIAGNOSIS — Z510 Encounter for antineoplastic radiation therapy: Secondary | ICD-10-CM | POA: Diagnosis not present

## 2021-01-21 ENCOUNTER — Ambulatory Visit: Payer: Medicare HMO | Attending: General Surgery

## 2021-01-21 ENCOUNTER — Other Ambulatory Visit: Payer: Self-pay

## 2021-01-21 ENCOUNTER — Ambulatory Visit: Payer: Medicare HMO

## 2021-01-21 DIAGNOSIS — M6281 Muscle weakness (generalized): Secondary | ICD-10-CM | POA: Diagnosis not present

## 2021-01-21 DIAGNOSIS — C50411 Malignant neoplasm of upper-outer quadrant of right female breast: Secondary | ICD-10-CM | POA: Insufficient documentation

## 2021-01-21 DIAGNOSIS — R293 Abnormal posture: Secondary | ICD-10-CM | POA: Diagnosis not present

## 2021-01-21 DIAGNOSIS — Z483 Aftercare following surgery for neoplasm: Secondary | ICD-10-CM | POA: Diagnosis not present

## 2021-01-21 DIAGNOSIS — R600 Localized edema: Secondary | ICD-10-CM | POA: Diagnosis not present

## 2021-01-21 DIAGNOSIS — M25612 Stiffness of left shoulder, not elsewhere classified: Secondary | ICD-10-CM | POA: Diagnosis not present

## 2021-01-21 DIAGNOSIS — D0512 Intraductal carcinoma in situ of left breast: Secondary | ICD-10-CM | POA: Insufficient documentation

## 2021-01-21 DIAGNOSIS — M25611 Stiffness of right shoulder, not elsewhere classified: Secondary | ICD-10-CM | POA: Insufficient documentation

## 2021-01-21 DIAGNOSIS — Z170 Estrogen receptor positive status [ER+]: Secondary | ICD-10-CM | POA: Insufficient documentation

## 2021-01-21 NOTE — Therapy (Signed)
[place], [place], [address] Phone: [phone]   Fax:  [phone]  Physical Therapy Treatment  Patient Details  Name: [name] MRN: [id number] Date of Birth: 1956/01/05 Referring Provider (PT): Isidore Moos   Encounter Date: 01/21/2021   PT End of Session - 01/21/21 [id number]     Visit Number 6    Number of Visits 12    Date for PT Re-Evaluation 01/24/21    PT Start Time [id number]    PT Stop Time [id number]    PT Time Calculation (min) 56 min    Behavior During Therapy [facility] for tasks assessed/performed             Past Medical History:  Diagnosis Date   Anal fissure    Atrial septal aneurysm / if pfo  echo 6 13  10/23/2011   Breast cancer ([place])    Cataract    both eyes   Depression    Fatty liver    "pre fatty liver"   Fibromyalgia    Headache(784.0)    hx of migraines when younger   Heart palpitations    hx with normal holter event monitoring   Hx: UTI (urinary tract infection)    Hyperlipidemia    Hypertension    Hypothyroidism    Obesity    Pneumonia 1972   hx of   Polyp of colon    Serrated adenoma of colon 08/2012   Skin cancer    basal, squamous cell   Sleep apnea    uses cpap    Past Surgical History:  Procedure Laterality Date   BREAST LUMPECTOMY WITH RADIOACTIVE SEED AND SENTINEL LYMPH NODE BIOPSY Right 07/26/2020   Procedure: RIGHT BREAST LUMPECTOMY WITH RADIOACTIVE SEED AND SENTINEL LYMPH NODE BIOPSY;  Surgeon: Jovita Kussmaul, MD;  Location: [place];  Service: General;  Laterality: Right;   BREAST LUMPECTOMY WITH RADIOACTIVE SEED LOCALIZATION Left 07/26/2020   Procedure: LEFT BREAST LUMPECTOMY X 2  WITH RADIOACTIVE SEED LOCALIZATION;  Surgeon: Jovita Kussmaul, MD;  Location: [place];  Service: General;  Laterality: Left;   COLONOSCOPY     DILATION AND CURETTAGE OF [place] ARTHROSCOPY     both in past   POLYPECTOMY     [place] Left 07/07/2013   Procedure: LEFT SHOULDER MINI OPEN SUBACROMIAL DECOMPRESSION ROTATOR CUFF REPAIR AND POSSIBLE PATCH GRAFT ;  Surgeon: Johnn Hai, MD;  Location[facility];  Service: Orthopedics;  Laterality: Left;  with interscaline block   SKIN CANCER EXCISION Bilateral    arm, legs, and chest   TONSILLECTOMY      There were no vitals filed for this visit.   Subjective Assessment - 01/21/21 1301     Subjective I f ell at home about a week ago from tripping over chairs.  It jarred my joints.  I have been doing stretches, but my sons stairs bother my back after the fall.  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 My thyroid is really low and I have been really tired.  Its a 2.5 and it is supposed to be 4.5. Radiation ended last week and skin is much better. My breasts are sore but don't really feel swollen.  right side is very red from the radiation.    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 She underwent a left lumpectomy for DCIS and a right lumpectomy and sentinel node biopsy on 07/26/2020. She had 2 lymph nodes removed with 1 being positive for cancer on the right side. It is ER/PR positive. Left shoulder rotator cuff repair 06/2013.                    L-DEX FLOWSHEETS - 01/21/21 1300       L-DEX LYMPHEDEMA SCREENING   Measurement Type Unilateral    L-DEX MEASUREMENT EXTREMITY Upper Extremity    POSITION  Standing    DOMINANT SIDE Right    At Risk Side Right    BASELINE SCORE (UNILATERAL) 3.5    L-DEX SCORE (UNILATERAL) 3.4    VALUE CHANGE (UNILAT) -0.1                       [facility] Adult PT Treatment/Exercise - 01/21/21 0001       Manual Therapy   Manual therapy comments assessed skin;rash nearly gone but right breast red from radiation and still appears swollen although no fibrosis noted.  Left breast without fibrosis or  visible swelling    Edema Management performed 3 month SOZO    Manual Lymphatic Drainage (MLD) Pt was instructed in MLD to trunk and breast in supine with head of table raised.Short neck, activated right axillary and inguinal LN's, Right axillo-inguinal pathway worked multiple times to decrease swelling at right axillary region and added right breast..  Pt was instructed in same and given written instructions. Also added left axillary LN and anterior interaxillary pathway secondary to left breast not swollen and pt shown how to direct swelling if present at medial breast toward the left axilla                          PT Long Term Goals - 12/13/20 1405       PT LONG TERM GOAL #1   Title Pt will be independent in a HEP for  UE and LE strength/ROM, balance    Time 6    Period Weeks    Status New    Target Date 01/24/21      PT LONG TERM GOAL #2   Title Pt will be independent in self MLD    Time 6    Period Weeks    Status New    Target Date 01/24/21      PT LONG TERM GOAL #3   Title Pt will be able to perform 11 sit to stands in 30 sec for improved function and decreased fall risk    Time 6    Period Weeks    Status New    Target Date 01/24/21      PT LONG TERM GOAL #4   Title Pt will be able to increase walking time to 20-30 minutes daily, not necessarily at 1 time.    Baseline 10 min    Time 6    Period Weeks    Target Date 01/24/21                   Plan - 01/21/21 1457     Clinical Impression Statement Pt returned after mising several weeks secondary to skin changes with radiation. Both breast still sore from radiation but right breast very red and more sore than left and with visible swelling on right.  We completed pts. 3 month  SOZO with excellent results.  Reviewed Self Manual lymph drainage to the right breast.  After review pt did very well overall with good understanding of technique and sequence.  If she is too sore she will wait several days  before trying it again. We also discussed use of compression bra when she can be comfortable with it.    Personal Factors and Comorbidities Age    Comorbidities Bilateral breast CA with Right SLNB, radiation,chemo, FMS,hypothyroidism    Stability/Clinical Decision Making Stable/Uncomplicated    Rehab Potential Excellent    PT Frequency 2x / week    PT Duration 6 weeks    PT Treatment/Interventions ADLs/Self Care Home Management;Therapeutic exercise;Patient/family education;Neuromuscular re-education;Balance training;Manual techniques;Manual lymph drainage;Passive range of motion    PT Next Visit Plan Recert,Review right  breast MLD prn(may use left axillary)review supine scapular series, balance (keep in mind pt fatigue from thyroid/radiation/chemo)    PT Home Exercise Plan piriformis stretch, sitting hip  IR and ER with red band, standing hip abd with red, supine clam with ppt and red band, supine scapular series, cone touches    Consulted and Agree with Plan of Care Patient             Patient will benefit from skilled therapeutic intervention in order to improve the following deficits and impairments:  Decreased range of motion, Decreased knowledge of precautions, Pain, Impaired UE functional use, Increased edema  Visit Diagnosis: Ductal carcinoma in situ (DCIS) of left breast  Aftercare following surgery for neoplasm  Localized edema  Abnormal posture  Muscle weakness (generalized)  Stiffness of right shoulder, not elsewhere classified  Stiffness of left shoulder, not elsewhere classified  Malignant neoplasm of upper-outer quadrant of right breast in female, estrogen receptor positive [facility])     Problem List Patient Active Problem List   Diagnosis Date Noted   Malignant neoplasm of upper-outer quadrant of right breast in female, estrogen receptor positive ([place]) 06/25/2020   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   Hepatic steatosis 11/27/2014   Hx  of adenomatous colonic polyps 11/27/2014   Visit for preventive health examination 03/28/2014   Hyperlipidemia 03/28/2014   Left rotator cuff tear 07/07/2013   Tear of left rotator cuff 07/07/2013   Rhinitis 06/01/2013   Chest wall deformity 04/12/2012   Atrial septal aneurysm / if pfo  echo 6 13  10/23/2011   OSA (obstructive sleep apnea) 11/27/2010   Dyslipidemia 11/01/2010   Flushing 08/29/2010   Labile hypertension 08/29/2010   MUSCLE CRAMPS, FOOT 06/10/2010   OTHER SLEEP DISTURBANCES 06/10/2010   ANXIETY, SITUATIONAL 05/08/2010   VERTIGO, POSITIONAL 03/15/2010   VITAMIN D DEFICIENCY 02/18/2010   OBESITY 02/18/2010   ALLERGIC RHINITIS 02/18/2010   PLANTAR FASCIITIS 02/18/2010   TWITCHING 06/11/2009   NUMBNESS, HAND 06/11/2009   CERVICAL STRAIN, ACUTE 06/11/2009   OTHER MALAISE AND FATIGUE 07/09/2007   HYPERTENSION 01/28/2007   Hypothyroidism 11/11/2006   COMMON MIGRAINE 11/11/2006   GERD 11/11/2006   FIBROMYALGIA 11/11/2006    Claris Pong, PT 01/21/2021, 3:02 PM  [place], [place], [address] Phone: [phone]   Fax:  [phone]  Name: [name] MRN: [id number] Date of Birth: April 30, 1956

## 2021-01-23 DIAGNOSIS — F418 Other specified anxiety disorders: Secondary | ICD-10-CM | POA: Diagnosis not present

## 2021-01-23 DIAGNOSIS — R69 Illness, unspecified: Secondary | ICD-10-CM | POA: Diagnosis not present

## 2021-01-24 ENCOUNTER — Ambulatory Visit: Payer: Medicare HMO

## 2021-01-24 ENCOUNTER — Other Ambulatory Visit: Payer: Self-pay

## 2021-01-24 DIAGNOSIS — Z170 Estrogen receptor positive status [ER+]: Secondary | ICD-10-CM

## 2021-01-24 DIAGNOSIS — M25612 Stiffness of left shoulder, not elsewhere classified: Secondary | ICD-10-CM | POA: Diagnosis not present

## 2021-01-24 DIAGNOSIS — C50411 Malignant neoplasm of upper-outer quadrant of right female breast: Secondary | ICD-10-CM | POA: Diagnosis not present

## 2021-01-24 DIAGNOSIS — M6281 Muscle weakness (generalized): Secondary | ICD-10-CM

## 2021-01-24 DIAGNOSIS — M25611 Stiffness of right shoulder, not elsewhere classified: Secondary | ICD-10-CM | POA: Diagnosis not present

## 2021-01-24 DIAGNOSIS — D0512 Intraductal carcinoma in situ of left breast: Secondary | ICD-10-CM | POA: Diagnosis not present

## 2021-01-24 DIAGNOSIS — R293 Abnormal posture: Secondary | ICD-10-CM | POA: Diagnosis not present

## 2021-01-24 DIAGNOSIS — R600 Localized edema: Secondary | ICD-10-CM

## 2021-01-24 DIAGNOSIS — Z483 Aftercare following surgery for neoplasm: Secondary | ICD-10-CM | POA: Diagnosis not present

## 2021-01-24 NOTE — Therapy (Signed)
[place], [place], [address] Phone: [phone]   Fax:  [phone]  Physical Therapy Treatment  Patient Details  Name: [name] MRN: [id number] Date of Birth: 08-23-1955 Referring Provider (PT): Isidore Moos   Encounter Date: 01/24/2021   PT End of Session - 01/24/21 1429     Visit Number 7    Number of Visits 12    Date for PT Re-Evaluation 01/24/21    PT Start Time 1400    PT Stop Time 1455    PT Time Calculation (min) 55 min    Activity Tolerance Patient tolerated treatment well    Behavior During Therapy [facility] for tasks assessed/performed             Past Medical History:  Diagnosis Date   Anal fissure    Atrial septal aneurysm / if pfo  echo 6 13  10/23/2011   Breast cancer ([place])    Cataract    both eyes   Depression    Fatty liver    "pre fatty liver"   Fibromyalgia    Headache(784.0)    hx of migraines when younger   Heart palpitations    hx with normal holter event monitoring   Hx: UTI (urinary tract infection)    Hyperlipidemia    Hypertension    Hypothyroidism    Obesity    Pneumonia 1972   hx of   Polyp of colon    Serrated adenoma of colon 08/2012   Skin cancer    basal, squamous cell   Sleep apnea    uses cpap    Past Surgical History:  Procedure Laterality Date   BREAST LUMPECTOMY WITH RADIOACTIVE SEED AND SENTINEL LYMPH NODE BIOPSY Right 07/26/2020   Procedure: RIGHT BREAST LUMPECTOMY WITH RADIOACTIVE SEED AND SENTINEL LYMPH NODE BIOPSY;  Surgeon: Jovita Kussmaul, MD;  Location: [place];  Service: General;  Laterality: Right;   BREAST LUMPECTOMY WITH RADIOACTIVE SEED LOCALIZATION Left 07/26/2020   Procedure: LEFT BREAST LUMPECTOMY X 2  WITH RADIOACTIVE SEED LOCALIZATION;  Surgeon: Jovita Kussmaul, MD;  Location: [place];  Service: General;  Laterality: Left;   COLONOSCOPY     DILATION AND CURETTAGE OF [place]  ARTHROSCOPY     both in past   POLYPECTOMY     [place] Left 07/07/2013   Procedure: LEFT SHOULDER MINI OPEN SUBACROMIAL DECOMPRESSION ROTATOR CUFF REPAIR AND POSSIBLE PATCH GRAFT ;  Surgeon: Johnn Hai, MD;  [facility];  Service: Orthopedics;  Laterality: Left;  with interscaline block   SKIN CANCER EXCISION Bilateral    arm, legs, and chest   TONSILLECTOMY      There were no vitals filed for this visit.   Subjective Assessment - 01/24/21 1402     Subjective My breast was really sore after Monday session but  I feel like I am doing well with everything. I still have a little swelling at the breast but I havent done any MLD lately.  Shoulder ROM feels good. No functional deficits that she is aware of around the house. 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 She underwent a left lumpectomy for DCIS and a right lumpectomy and sentinel node biopsy on 07/26/2020. She had 2 lymph  nodes removed with 1 being positive for cancer on the right side. It is ER/PR positive. Left shoulder rotator cuff repair 06/2013.    Patient Stated Goals Learn MLD, strengthen legs so legs don't feel so fatigued, sit to stand    Currently in Pain? No/denies    Pain Score 0-No pain    Pain Location Breast    Pain Orientation Right;Left    Pain Descriptors / Indicators Sore    Pain Type Other (Comment)   soreness               [facility] PT Assessment - 01/24/21 0001       Assessment   Medical Diagnosis s/p bil lumpectomy and right SLNB    Referring Provider (PT) Isidore Moos    Onset Date/Surgical Date 07/26/20    Hand Dominance Right      AROM   Right Shoulder Extension 75 Degrees    Right Shoulder Flexion 167 Degrees    Right Shoulder ABduction 180 Degrees    Right Shoulder External Rotation 95 Degrees    Left Shoulder  Extension 70 Degrees    Left Shoulder Flexion 166 Degrees    Left Shoulder ABduction 175 Degrees    Left Shoulder External Rotation 85 Degrees      Strength   Overall Strength Within functional limits for tasks performed               LYMPHEDEMA/ONCOLOGY QUESTIONNAIRE - 01/24/21 0001       Right Upper Extremity Lymphedema   10 cm Proximal to Olecranon Process 32 cm    Olecranon Process 27 cm    10 cm Proximal to Ulnar Styloid Process 26 cm    Just Proximal to Ulnar Styloid Process 18 cm    Across Hand at [employer] 19.4 cm    At [place] of 2nd Digit 6.4 cm      Left Upper Extremity Lymphedema   10 cm Proximal to Olecranon Process 33.2 cm    Olecranon Process 27.7 cm    10 cm Proximal to Ulnar Styloid Process 24.2 cm    Just Proximal to Ulnar Styloid Process 17.8 cm    Across Hand at [employer] 19.4 cm    At [place] of 2nd Digit 6.4 cm                        [facility] Adult PT Treatment/Exercise - 01/24/21 0001       Shoulder Exercises: Supine   Horizontal ABduction Strengthening;Both;10 reps    Theraband Level (Shoulder Horizontal ABduction) Level 1 (Yellow)    External Rotation Strengthening;Both;10 reps    Theraband Level (Shoulder External Rotation) Level 1 (Yellow)    Flexion Strengthening;Both;10 reps    Theraband Level (Shoulder Flexion) Level 1 (Yellow)    Diagonals Strengthening;Right;Left;5 reps    Theraband Level (Shoulder Diagonals) Level 1 (Yellow)      Manual Therapy   Manual therapy comments continues with some swelling in right breast. Very tender today.  Suggested she wear compression bra if can do so comfortably.    Edema Management Discussed prophylactic sleeve and gauntlet.  Pt decided she would like to have and was given [facility] form and script. Advised to retake ABC class as she didn't really remember much about it.    Manual Lymphatic Drainage (MLD) Revewed and pt practiced MLD to trunk and breast in supine with head of table  raised.Short neck, activated right axillary and inguinal LN's, Right axillo-inguinal  pathway worked multiple times to decrease swelling at right axillary region and discussed but did not perform on right breast today secondary to increased soreness in right breast..  Also added left axillary LN and anterior interaxillary pathway secondary to left breast not swollen and pt shown how to direct swelling if present at medial breast toward the left axilla                          PT Long Term Goals - 01/24/21 1422       PT LONG TERM GOAL #1   Title Pt will be independent in a HEP for  UE and LE strength/ROM, balance    Time 6    Period Weeks    Status Achieved    Target Date 01/24/21      PT LONG TERM GOAL #2   Title Pt will be independent in self MLD    Time 6    Period Weeks    Status Achieved    Target Date 01/24/21      PT LONG TERM GOAL #3   Title Pt will be able to perform 11 sit to stands in 30 sec for improved function and decreased fall risk    Baseline deffered secondary to knee pain    Status Deferred    Target Date 01/24/21      PT LONG TERM GOAL #4   Title Pt will be able to increase walking time to 20-30 minutes daily, not necessarily at 1 time.    Baseline 15 min 1x /day    Time 6    Period Weeks    Status Not Met                   Plan - 01/24/21 1702     Clinical Impression Statement Pt was reassessed today.  Pt progressing well with shoulder AROM, and faring well with circumferential measurements.  She is independent with a HEP but has recently had to limit what she does secondary to tripping recently and jarring her joints.  Breast redness and tenderness from radiation is still present however she will start wearing her compression bra and is independent with Manual lymph drainage to the breast using good technique.  She felt ready to be released to a HEP as she is dealing with some other medical issues, but was educated to contact us  should she feel she needs to return. She was advised to consider doing the ABC class again and she signed up for it today,. We discussed prophylactic compression sleeves and she was given an Alight form and script to take to [facility]. (I forgot to get the signed copy from her)    Personal Factors and Comorbidities Age    Comorbidities Bilateral breast CA with Right SLNB, radiation,chemo, FMS,hypothyroidism    Stability/Clinical Decision Making Stable/Uncomplicated    Rehab Potential Excellent    PT Frequency 2x / week    PT Duration 6 weeks    PT Treatment/Interventions ADLs/Self Care Home Management;Therapeutic exercise;Patient/family education;Neuromuscular re-education;Balance training;Manual techniques;Manual lymph drainage;Passive range of motion    PT Next Visit Plan DC to HEP    PT Home Exercise Plan piriformis stretch, sitting hip  IR and ER with red band, standing hip abd with red, supine clam with ppt and red band, supine scapular series, cone touches, Right breast MLD when tenderness decreases    Consulted and Agree with Plan of Care Patient  Patient will benefit from skilled therapeutic intervention in order to improve the following deficits and impairments:  Decreased range of motion, Decreased knowledge of precautions, Pain, Impaired UE functional use, Increased edema  Visit Diagnosis: Ductal carcinoma in situ (DCIS) of left breast  Aftercare following surgery for neoplasm  Localized edema  Abnormal posture  Muscle weakness (generalized)  Stiffness of right shoulder, not elsewhere classified  Stiffness of left shoulder, not elsewhere classified  Malignant neoplasm of upper-outer quadrant of right breast in female, estrogen receptor positive [facility])     Problem List Patient Active Problem List   Diagnosis Date Noted   Malignant neoplasm of upper-outer quadrant of right breast in female, estrogen receptor positive ([place]) 06/25/2020   Ductal  carcinoma in situ (DCIS) of left breast 06/25/2020   Hepatic steatosis 11/27/2014   Hx of adenomatous colonic polyps 11/27/2014   Visit for preventive health examination 03/28/2014   Hyperlipidemia 03/28/2014   Left rotator cuff tear 07/07/2013   Tear of left rotator cuff 07/07/2013   Rhinitis 06/01/2013   Chest wall deformity 04/12/2012   Atrial septal aneurysm / if pfo  echo 6 13  10/23/2011   OSA (obstructive sleep apnea) 11/27/2010   Dyslipidemia 11/01/2010   Flushing 08/29/2010   Labile hypertension 08/29/2010   MUSCLE CRAMPS, FOOT 06/10/2010   OTHER SLEEP DISTURBANCES 06/10/2010   ANXIETY, SITUATIONAL 05/08/2010   VERTIGO, POSITIONAL 03/15/2010   VITAMIN D DEFICIENCY 02/18/2010   OBESITY 02/18/2010   ALLERGIC RHINITIS 02/18/2010   PLANTAR FASCIITIS 02/18/2010   TWITCHING 06/11/2009   NUMBNESS, HAND 06/11/2009   CERVICAL STRAIN, ACUTE 06/11/2009   OTHER MALAISE AND FATIGUE 07/09/2007   HYPERTENSION 01/28/2007   Hypothyroidism 11/11/2006   COMMON MIGRAINE 11/11/2006   GERD 11/11/2006   FIBROMYALGIA 11/11/2006  PHYSICAL THERAPY DISCHARGE SUMMARY  Visits from Start of Care: 7  Current functional level related to goals / functional outcomes: Achieved 2 goals, deferred sit to stand secondary to knee pain today. Did not meet walking goal   Remaining deficits: Some right breast swelling remains, but pt independent with MD and needs to resume compression bra.   Education / Equipment: Compression bra/getting prophylactic sleeve and gauntlet   Patient agrees to discharge. Patient goals were partially met. Patient is being discharged due to being pleased with the current functional level.   Claris Pong, PT 01/24/2021, 5:16 PM  [place], [place], [address] Phone: [phone]   Fax:  [phone]  Name: JIMENA WIECZOREK MRN: [id number] Date of Birth: [birth year]

## 2021-01-28 DIAGNOSIS — M9902 Segmental and somatic dysfunction of thoracic region: Secondary | ICD-10-CM | POA: Diagnosis not present

## 2021-01-28 DIAGNOSIS — M9903 Segmental and somatic dysfunction of lumbar region: Secondary | ICD-10-CM | POA: Diagnosis not present

## 2021-01-28 DIAGNOSIS — M9901 Segmental and somatic dysfunction of cervical region: Secondary | ICD-10-CM | POA: Diagnosis not present

## 2021-01-28 DIAGNOSIS — M531 Cervicobrachial syndrome: Secondary | ICD-10-CM | POA: Diagnosis not present

## 2021-01-29 ENCOUNTER — Telehealth: Payer: Self-pay | Admitting: Hematology

## 2021-01-29 ENCOUNTER — Other Ambulatory Visit: Payer: Self-pay | Admitting: Hematology

## 2021-01-29 MED ORDER — EXEMESTANE 25 MG PO TABS: 25.0000 mg | ORAL_TABLET | Freq: Every day | ORAL | 3 refills | Status: DC

## 2021-01-29 NOTE — Telephone Encounter (Signed)
I called pt back regarding her concern of high copay for Exemestane. She has tried different pharmacies, and the lowest is $44 for 1 month supply.  I told her to try [facility], I sent a prescription to them and gave her the phone number to call.  She will start next week when she has the medicine.  I will also send a message to our financial advocate, to see if any financial assistance is available for her high co-pay.  We also discussed alternatives such as anastrozole if she is not able to pay the high co-pay in the future.   Truitt Merle  01/29/2021

## 2021-01-30 ENCOUNTER — Encounter: Payer: Self-pay | Admitting: Hematology

## 2021-01-30 ENCOUNTER — Other Ambulatory Visit: Payer: Self-pay

## 2021-01-30 ENCOUNTER — Ambulatory Visit: Payer: Medicare HMO

## 2021-01-30 DIAGNOSIS — C50411 Malignant neoplasm of upper-outer quadrant of right female breast: Secondary | ICD-10-CM

## 2021-01-30 DIAGNOSIS — Z170 Estrogen receptor positive status [ER+]: Secondary | ICD-10-CM

## 2021-01-30 NOTE — Telephone Encounter (Signed)
Open in error

## 2021-01-30 NOTE — Progress Notes (Signed)
Called pt regarding assistance w/ [employer] for [employer].  Pt requested that I email her the application so she can complete her portion, which I did, and I also gave Dr. Burr Medico her portion to complete.  Once I receive the completed application from the pt and Dr. Burr Medico I will fax to [place] for processing.

## 2021-02-05 ENCOUNTER — Encounter: Payer: Self-pay | Admitting: Hematology

## 2021-02-05 DIAGNOSIS — L819 Disorder of pigmentation, unspecified: Secondary | ICD-10-CM

## 2021-02-05 DIAGNOSIS — C50411 Malignant neoplasm of upper-outer quadrant of right female breast: Secondary | ICD-10-CM

## 2021-02-05 DIAGNOSIS — N281 Cyst of kidney, acquired: Secondary | ICD-10-CM

## 2021-02-05 NOTE — Telephone Encounter (Signed)
Regard to the liver cyst and the chemotherapy I suggest you talk with your oncologist first about need for any further imaging.  In regard to the leg I can see in the office but may benefit from seeing our sports medicine people.  If you agree please refer to sports medicine [place].  About the leg discoloration.

## 2021-02-06 ENCOUNTER — Other Ambulatory Visit: Payer: Self-pay

## 2021-02-06 DIAGNOSIS — R69 Illness, unspecified: Secondary | ICD-10-CM | POA: Diagnosis not present

## 2021-02-06 DIAGNOSIS — C50411 Malignant neoplasm of upper-outer quadrant of right female breast: Secondary | ICD-10-CM

## 2021-02-06 DIAGNOSIS — F418 Other specified anxiety disorders: Secondary | ICD-10-CM | POA: Diagnosis not present

## 2021-02-06 DIAGNOSIS — Z170 Estrogen receptor positive status [ER+]: Secondary | ICD-10-CM

## 2021-02-06 MED ORDER — EXEMESTANE 25 MG PO TABS: 25.0000 mg | ORAL_TABLET | Freq: Every day | ORAL | 1 refills | Status: DC

## 2021-02-07 ENCOUNTER — Other Ambulatory Visit: Payer: Self-pay

## 2021-02-07 ENCOUNTER — Telehealth: Payer: Self-pay | Admitting: Hematology

## 2021-02-07 DIAGNOSIS — Z170 Estrogen receptor positive status [ER+]: Secondary | ICD-10-CM

## 2021-02-07 DIAGNOSIS — C50411 Malignant neoplasm of upper-outer quadrant of right female breast: Secondary | ICD-10-CM

## 2021-02-07 NOTE — Telephone Encounter (Signed)
Left message with information in reference to the nutrition classes offered here at the cancer center and to also let her know the nurse is working on getting the referral in for nutrition as well, left my call back as well as the information for the classes

## 2021-02-08 DIAGNOSIS — G4733 Obstructive sleep apnea (adult) (pediatric): Secondary | ICD-10-CM | POA: Diagnosis not present

## 2021-02-11 ENCOUNTER — Encounter: Payer: Self-pay | Admitting: Hematology

## 2021-02-11 DIAGNOSIS — M9901 Segmental and somatic dysfunction of cervical region: Secondary | ICD-10-CM | POA: Diagnosis not present

## 2021-02-11 DIAGNOSIS — M6289 Other specified disorders of muscle: Secondary | ICD-10-CM | POA: Diagnosis not present

## 2021-02-11 DIAGNOSIS — N3946 Mixed incontinence: Secondary | ICD-10-CM | POA: Diagnosis not present

## 2021-02-11 DIAGNOSIS — M9902 Segmental and somatic dysfunction of thoracic region: Secondary | ICD-10-CM | POA: Diagnosis not present

## 2021-02-11 DIAGNOSIS — M531 Cervicobrachial syndrome: Secondary | ICD-10-CM | POA: Diagnosis not present

## 2021-02-11 DIAGNOSIS — M9903 Segmental and somatic dysfunction of lumbar region: Secondary | ICD-10-CM | POA: Diagnosis not present

## 2021-02-11 DIAGNOSIS — D499 Neoplasm of unspecified behavior of unspecified site: Secondary | ICD-10-CM | POA: Diagnosis not present

## 2021-02-11 DIAGNOSIS — M545 Low back pain, unspecified: Secondary | ICD-10-CM | POA: Diagnosis not present

## 2021-02-12 DIAGNOSIS — N8182 Incompetence or weakening of pubocervical tissue: Secondary | ICD-10-CM | POA: Diagnosis not present

## 2021-02-14 ENCOUNTER — Other Ambulatory Visit: Payer: Self-pay

## 2021-02-14 ENCOUNTER — Encounter: Payer: Self-pay | Admitting: Hematology

## 2021-02-14 ENCOUNTER — Telehealth: Payer: Self-pay

## 2021-02-14 DIAGNOSIS — C50411 Malignant neoplasm of upper-outer quadrant of right female breast: Secondary | ICD-10-CM

## 2021-02-14 NOTE — Telephone Encounter (Signed)
I called the patient today about her upcoming follow-up appointment in radiation oncology.   Given the state of the COVID-19 pandemic, concerning case numbers in our community, and guidance from [facility], I offered a phone assessment with the patient to determine if coming to the clinic was necessary. She accepted.  The patient reports RT breast tenderness 4/10. Specifically, they report good healing of their skin in the radiation fields. Skin is intact,  w/ some peeling.  I recommended that she continue skin care by applying oil or lotion with vitamin E to the skin in the radiation fields, BID, for 2 more months.  Continue follow-up with medical oncology - follow-up is scheduled on 04/11/21.  I explained that yearly mammograms are important for patients with intact breast tissue, and physical exams are important after mastectomy for patients that cannot undergo mammography.  I encouraged her to call if she had further questions or concerns about her healing. Otherwise, she will follow-up PRN in radiation oncology. Patient is pleased with this plan, and we will cancel her upcoming follow-up to reduce the risk of COVID-19 transmission.

## 2021-02-15 ENCOUNTER — Ambulatory Visit: Payer: Medicare HMO | Admitting: Radiation Oncology

## 2021-02-18 DIAGNOSIS — N3946 Mixed incontinence: Secondary | ICD-10-CM | POA: Diagnosis not present

## 2021-02-18 DIAGNOSIS — M545 Low back pain, unspecified: Secondary | ICD-10-CM | POA: Diagnosis not present

## 2021-02-18 DIAGNOSIS — M9905 Segmental and somatic dysfunction of pelvic region: Secondary | ICD-10-CM | POA: Diagnosis not present

## 2021-02-18 DIAGNOSIS — M6289 Other specified disorders of muscle: Secondary | ICD-10-CM | POA: Diagnosis not present

## 2021-02-19 ENCOUNTER — Telehealth: Payer: Self-pay | Admitting: *Deleted

## 2021-02-19 NOTE — Telephone Encounter (Signed)
RETURNED PATIENT'S PHONE CALL, SPOKE WITH PATIENT. ?

## 2021-02-20 ENCOUNTER — Encounter: Payer: Self-pay | Admitting: Hematology

## 2021-02-20 ENCOUNTER — Telehealth: Payer: Self-pay | Admitting: Hematology

## 2021-02-20 DIAGNOSIS — F418 Other specified anxiety disorders: Secondary | ICD-10-CM | POA: Diagnosis not present

## 2021-02-20 DIAGNOSIS — R69 Illness, unspecified: Secondary | ICD-10-CM | POA: Diagnosis not present

## 2021-02-20 NOTE — Telephone Encounter (Signed)
Spoke to patient who stated she was in pain in her under arm area, said she has an appointment with Dr. Isidore Moos later this month but needs to be seen sooner than later, she said she put a call into the oncology physical therapy office and again appointments are pushed back, I told her I would reach out to her doctors to see who she actually needs to be seen by

## 2021-02-20 NOTE — Telephone Encounter (Signed)
Scheduled per sch msg. Called, was not able to leave msg. Will attempt to call again

## 2021-02-20 NOTE — Progress Notes (Signed)
Pt is eligible to receive free Aromasin through [employer] Patient Assistance Program until 05/11/21.

## 2021-02-21 ENCOUNTER — Other Ambulatory Visit: Payer: Self-pay

## 2021-02-21 ENCOUNTER — Inpatient Hospital Stay: Payer: Medicare HMO | Attending: Hematology | Admitting: Dietician

## 2021-02-21 ENCOUNTER — Encounter: Payer: Self-pay | Admitting: Rehabilitation

## 2021-02-21 ENCOUNTER — Ambulatory Visit: Payer: Medicare HMO | Attending: Hematology | Admitting: Rehabilitation

## 2021-02-21 DIAGNOSIS — Z170 Estrogen receptor positive status [ER+]: Secondary | ICD-10-CM | POA: Insufficient documentation

## 2021-02-21 DIAGNOSIS — Z8719 Personal history of other diseases of the digestive system: Secondary | ICD-10-CM | POA: Insufficient documentation

## 2021-02-21 DIAGNOSIS — G629 Polyneuropathy, unspecified: Secondary | ICD-10-CM | POA: Insufficient documentation

## 2021-02-21 DIAGNOSIS — Z923 Personal history of irradiation: Secondary | ICD-10-CM | POA: Insufficient documentation

## 2021-02-21 DIAGNOSIS — R600 Localized edema: Secondary | ICD-10-CM | POA: Insufficient documentation

## 2021-02-21 DIAGNOSIS — R293 Abnormal posture: Secondary | ICD-10-CM | POA: Diagnosis not present

## 2021-02-21 DIAGNOSIS — M6281 Muscle weakness (generalized): Secondary | ICD-10-CM | POA: Diagnosis not present

## 2021-02-21 DIAGNOSIS — C50912 Malignant neoplasm of unspecified site of left female breast: Secondary | ICD-10-CM | POA: Diagnosis not present

## 2021-02-21 DIAGNOSIS — C50411 Malignant neoplasm of upper-outer quadrant of right female breast: Secondary | ICD-10-CM | POA: Insufficient documentation

## 2021-02-21 DIAGNOSIS — Z483 Aftercare following surgery for neoplasm: Secondary | ICD-10-CM | POA: Diagnosis not present

## 2021-02-21 DIAGNOSIS — E669 Obesity, unspecified: Secondary | ICD-10-CM | POA: Insufficient documentation

## 2021-02-21 DIAGNOSIS — C50911 Malignant neoplasm of unspecified site of right female breast: Secondary | ICD-10-CM | POA: Diagnosis not present

## 2021-02-21 DIAGNOSIS — Z9221 Personal history of antineoplastic chemotherapy: Secondary | ICD-10-CM | POA: Insufficient documentation

## 2021-02-21 DIAGNOSIS — K760 Fatty (change of) liver, not elsewhere classified: Secondary | ICD-10-CM | POA: Insufficient documentation

## 2021-02-21 DIAGNOSIS — E785 Hyperlipidemia, unspecified: Secondary | ICD-10-CM | POA: Insufficient documentation

## 2021-02-21 DIAGNOSIS — Z85828 Personal history of other malignant neoplasm of skin: Secondary | ICD-10-CM | POA: Insufficient documentation

## 2021-02-21 DIAGNOSIS — I10 Essential (primary) hypertension: Secondary | ICD-10-CM | POA: Insufficient documentation

## 2021-02-21 DIAGNOSIS — F32A Depression, unspecified: Secondary | ICD-10-CM | POA: Insufficient documentation

## 2021-02-21 DIAGNOSIS — M797 Fibromyalgia: Secondary | ICD-10-CM | POA: Insufficient documentation

## 2021-02-21 DIAGNOSIS — D0512 Intraductal carcinoma in situ of left breast: Secondary | ICD-10-CM | POA: Insufficient documentation

## 2021-02-21 DIAGNOSIS — Z86010 Personal history of colonic polyps: Secondary | ICD-10-CM | POA: Insufficient documentation

## 2021-02-21 DIAGNOSIS — R5383 Other fatigue: Secondary | ICD-10-CM | POA: Insufficient documentation

## 2021-02-21 DIAGNOSIS — E039 Hypothyroidism, unspecified: Secondary | ICD-10-CM | POA: Insufficient documentation

## 2021-02-21 NOTE — Progress Notes (Signed)
Patient did not show for scheduled nutrition appointment.  

## 2021-02-21 NOTE — Therapy (Signed)
[facility] @ [place], [place], [address] Phone: [phone]   Fax:  [phone]  Physical Therapy Treatment  Patient Details  Name: [name] MRN: [id number] Date of Birth: March 04, 1956 Referring Provider (PT): Isidore Moos   Encounter Date: 02/21/2021   PT End of Session - 02/21/21 1330     Visit Number 8    Number of Visits 16    Date for PT Re-Evaluation 03/21/21    PT Start Time 9518    PT Stop Time 1320    PT Time Calculation (min) 50 min    Activity Tolerance Patient tolerated treatment well    Behavior During Therapy [facility] for tasks assessed/performed             Past Medical History:  Diagnosis Date   Anal fissure    Atrial septal aneurysm / if pfo  echo 6 13  10/23/2011   Breast cancer ([place])    Cataract    both eyes   Depression    Fatty liver    "pre fatty liver"   Fibromyalgia    Headache(784.0)    hx of migraines when younger   Heart palpitations    hx with normal holter event monitoring   Hx: UTI (urinary tract infection)    Hyperlipidemia    Hypertension    Hypothyroidism    Obesity    Pneumonia 1972   hx of   Polyp of colon    Serrated adenoma of colon 08/2012   Skin cancer    basal, squamous cell   Sleep apnea    uses cpap    Past Surgical History:  Procedure Laterality Date   BREAST LUMPECTOMY WITH RADIOACTIVE SEED AND SENTINEL LYMPH NODE BIOPSY Right 07/26/2020   Procedure: RIGHT BREAST LUMPECTOMY WITH RADIOACTIVE SEED AND SENTINEL LYMPH NODE BIOPSY;  Surgeon: Jovita Kussmaul, MD;  Location: [place];  Service: General;  Laterality: Right;   BREAST LUMPECTOMY WITH RADIOACTIVE SEED LOCALIZATION Left 07/26/2020   Procedure: LEFT BREAST LUMPECTOMY X 2  WITH RADIOACTIVE SEED LOCALIZATION;  Surgeon: Jovita Kussmaul, MD;  Location: [place];  Service: General;  Laterality: Left;   COLONOSCOPY     DILATION AND CURETTAGE OF [place]  ARTHROSCOPY     both in past   POLYPECTOMY     [place] Left 07/07/2013   Procedure: LEFT SHOULDER MINI OPEN SUBACROMIAL DECOMPRESSION ROTATOR CUFF REPAIR AND POSSIBLE PATCH GRAFT ;  Surgeon: Johnn Hai, MD;  Location: [facility];  Service: Orthopedics;  Laterality: Left;  with interscaline block   SKIN CANCER EXCISION Bilateral    arm, legs, and chest   TONSILLECTOMY      There were no vitals filed for this visit.   Subjective Assessment - 02/21/21 1228     Subjective Its starting to get larger and more sore in the armpit.  I tried to do the bra but it seems to rub too much. I should see them next week.    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 She underwent a left lumpectomy for DCIS and a right lumpectomy and sentinel node biopsy on 07/26/2020. She had 2 lymph nodes removed with 1 being positive for cancer on the right side. It is ER/PR positive. Left shoulder rotator cuff repair 06/2013. Last radiation on 01/15/21.  Seroma that  was drained x 2.    Patient Stated Goals Check out the seroma    Currently in Pain? Yes    Pain Score 4     Pain Location Axilla   scapula   Pain Orientation Right    Pain Descriptors / Indicators Aching;Sore    Pain Type Acute pain    Pain Radiating Towards the breast    Pain Onset 1 to 4 weeks ago    Pain Frequency Constant    Aggravating Factors  pulling, opening doors, touching it    Pain Relieving Factors certain position                [facility] PT Assessment - 02/21/21 0001       Assessment   Medical Diagnosis s/p bil lumpectomy and right SLNB    Referring Provider (PT) Isidore Moos    Onset Date/Surgical Date 07/26/20    Hand Dominance Right      Palpation   Palpation comment golf ball sized seroma with hard edges in near the left axilla with +1-2 ttp                           [facility] Adult PT Treatment/Exercise - 02/21/21 0001        Manual Therapy   Manual therapy comments lengthy review of seroma, healing, treatment options    Edema Management showed pt lower compression prairie bra and discussed making a homemade jovi axillary pad from a sock and washcloth for axillary pressure    Manual Lymphatic Drainage (MLD) Revewed MLD while PT performed to Rt seroma area:.Short neck, superficial and deep abdominals, activated bil axillary and Rt inguinal LN's, Right axillo-inguinal pathway and axillo inguinal pathway then work to the Rt seroma region and then reversing steps.                     PT Education - 02/21/21 1330     Education Details POC, review of MLD, seroma healing    Person(s) Educated Patient    Methods Explanation;Demonstration;Tactile cues;Verbal cues;Handout    Comprehension Verbalized understanding;Returned demonstration                 PT Long Term Goals - 02/21/21 1334       PT LONG TERM GOAL #1   Title Pt will report return to baseline of no resting axillary pain    Time 4    Period Weeks    Status New      PT LONG TERM GOAL #2   Title Pt will be independent in self MLD    Time 4    Period Weeks    Status New                   Plan - 02/21/21 1331     Clinical Impression Statement Pt returns with increasing seroma size in the Rt axilla/superior breast with increasing pain with size.  Seroma feels around golf ball sized with hard borders and is tender to touch.  The breast may be mildly swollen but no tightness noted in the skin.  Performed MLD to the Rt seroma with gentle pressure with pt reporting it felt much more comfortable post treatment.  Will see Dr. Burr Medico next week for follow up but will continue MLD for th eRt seroma for softening and pain relief.    Personal Factors and Comorbidities Age;Time since onset of injury/illness/exacerbation;Comorbidity 3+    Comorbidities Bilateral  breast CA with Right SLNB, radiation,chemo, FMS,hypothyroidism     Stability/Clinical Decision Making Stable/Uncomplicated    Clinical Decision Making Low    Rehab Potential Good    PT Frequency 2x / week    PT Duration 4 weeks    PT Treatment/Interventions ADLs/Self Care Home Management;Therapeutic exercise;Patient/family education;Neuromuscular re-education;Balance training;Manual techniques;Manual lymph drainage;Passive range of motion;Taping    PT Next Visit Plan MLD to the Rt seroma near the axillary incision to work on softening and pain relief,    Consulted and Agree with Plan of Care Patient             Patient will benefit from skilled therapeutic intervention in order to improve the following deficits and impairments:  Decreased knowledge of precautions, Pain, Increased edema  Visit Diagnosis: Ductal carcinoma in situ (DCIS) of left breast  Aftercare following surgery for neoplasm  Localized edema     Problem List Patient Active Problem List   Diagnosis Date Noted   Malignant neoplasm of upper-outer quadrant of right breast in female, estrogen receptor positive ([place]) 06/25/2020   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   Hepatic steatosis 11/27/2014   Hx of adenomatous colonic polyps 11/27/2014   Visit for preventive health examination 03/28/2014   Hyperlipidemia 03/28/2014   Left rotator cuff tear 07/07/2013   Tear of left rotator cuff 07/07/2013   Rhinitis 06/01/2013   Chest wall deformity 04/12/2012   Atrial septal aneurysm / if pfo  echo 6 13  10/23/2011   OSA (obstructive sleep apnea) 11/27/2010   Dyslipidemia 11/01/2010   Flushing 08/29/2010   Labile hypertension 08/29/2010   MUSCLE CRAMPS, FOOT 06/10/2010   OTHER SLEEP DISTURBANCES 06/10/2010   ANXIETY, SITUATIONAL 05/08/2010   VERTIGO, POSITIONAL 03/15/2010   VITAMIN D DEFICIENCY 02/18/2010   OBESITY 02/18/2010   ALLERGIC RHINITIS 02/18/2010   PLANTAR FASCIITIS 02/18/2010   TWITCHING 06/11/2009   NUMBNESS, HAND 06/11/2009   CERVICAL STRAIN,  ACUTE 06/11/2009   OTHER MALAISE AND FATIGUE 07/09/2007   HYPERTENSION 01/28/2007   Hypothyroidism 11/11/2006   COMMON MIGRAINE 11/11/2006   GERD 11/11/2006   FIBROMYALGIA 11/11/2006    Stark Bray, PT 02/21/2021, 1:36 PM  [facility] @ [place], [place], [address] Phone: [phone]   Fax:  [phone]  Name: [name] MRN: [id number] Date of Birth: 1955-07-19

## 2021-02-22 DIAGNOSIS — R5381 Other malaise: Secondary | ICD-10-CM | POA: Diagnosis not present

## 2021-02-22 DIAGNOSIS — M545 Low back pain, unspecified: Secondary | ICD-10-CM | POA: Diagnosis not present

## 2021-02-22 DIAGNOSIS — N3946 Mixed incontinence: Secondary | ICD-10-CM | POA: Diagnosis not present

## 2021-02-22 DIAGNOSIS — M6289 Other specified disorders of muscle: Secondary | ICD-10-CM | POA: Diagnosis not present

## 2021-02-22 DIAGNOSIS — M9905 Segmental and somatic dysfunction of pelvic region: Secondary | ICD-10-CM | POA: Diagnosis not present

## 2021-02-22 NOTE — Telephone Encounter (Signed)
Hi [name]  So I am back from being out of office. Apologies  that I misinterpreted  your messages .  The recent abdominal imaging showed renal cyst  without findings of concern  .   Would advise  a  urology ( not  nephrology )  consult  at this time . Sometimes a mri or other  more specific imaging and help define   appropriate follow up. If indicated  (  Fortunately most cysts are benign) . The cyst wasn't mentioned on  abdominal ultrasound in 2016 .  Getting the mri  report  that showed the cyst  from the past may be helpful to the specialist.   I have placed a urology referral consult .   You should be contacted about appt .  Your renal function is normal  and if  urinalysis is ok then wouldn't pursue  nephrology at this time.    Can make appt with me or your specialist regarding  other issue not addressed .

## 2021-02-25 NOTE — Telephone Encounter (Signed)
I would keep the urology appointment I do not there is a danger of waiting till November.     Do not know if the MRI will show some of the kidneys would have to asked the urology specialist about what is appropriate for imaging.  If needed.

## 2021-02-26 ENCOUNTER — Other Ambulatory Visit: Payer: Self-pay

## 2021-02-26 ENCOUNTER — Other Ambulatory Visit: Payer: Self-pay | Admitting: Nurse Practitioner

## 2021-02-26 ENCOUNTER — Telehealth: Payer: Self-pay | Admitting: *Deleted

## 2021-02-26 ENCOUNTER — Ambulatory Visit: Payer: Medicare HMO | Admitting: Physical Therapy

## 2021-02-26 ENCOUNTER — Encounter: Payer: Self-pay | Admitting: Physical Therapy

## 2021-02-26 DIAGNOSIS — R600 Localized edema: Secondary | ICD-10-CM

## 2021-02-26 DIAGNOSIS — R293 Abnormal posture: Secondary | ICD-10-CM | POA: Diagnosis not present

## 2021-02-26 DIAGNOSIS — Z483 Aftercare following surgery for neoplasm: Secondary | ICD-10-CM | POA: Diagnosis not present

## 2021-02-26 DIAGNOSIS — M6281 Muscle weakness (generalized): Secondary | ICD-10-CM | POA: Diagnosis not present

## 2021-02-26 DIAGNOSIS — D0512 Intraductal carcinoma in situ of left breast: Secondary | ICD-10-CM

## 2021-02-26 MED ORDER — DOXYCYCLINE HYCLATE 100 MG PO TABS: 100.0000 mg | ORAL_TABLET | Freq: Two times a day (BID) | ORAL | 0 refills | Status: DC

## 2021-02-26 NOTE — Telephone Encounter (Signed)
Patient was at Physical Therapy today for outpatient rehab.  Therapist routed photo of swollen seroma under axilla to Frances Rue, NP.  She requested a follow up with Dr Ethlyn Gallery office.  First available scheduled for this Thursday.  In meantime Lacie ordered antibiotic for patient.  Patient is aware, no further questions at this time.  She is aware to follow up prior to that appt if issue worsens.

## 2021-02-26 NOTE — Therapy (Signed)
[facility] @ [place], [place], [address] Phone: [phone]   Fax:  [phone]  Physical Therapy Treatment  Patient Details  Name: [name] MRN: [id number] Date of Birth: [birth year] Referring Provider (PT): Reita May Date: 02/26/2021   PT End of Session - 02/26/21 1112     Visit Number 9    Number of Visits 16    Date for PT Re-Evaluation 03/21/21    PT Start Time 1000    PT Stop Time 1045    PT Time Calculation (min) 45 min    Activity Tolerance Patient tolerated treatment well    Behavior During Therapy [facility] for tasks assessed/performed             Past Medical History:  Diagnosis Date   Anal fissure    Atrial septal aneurysm / if pfo  echo 6 13  10/23/2011   Breast cancer ([place])    Cataract    both eyes   Depression    Fatty liver    "pre fatty liver"   Fibromyalgia    Headache(784.0)    hx of migraines when younger   Heart palpitations    hx with normal holter event monitoring   Hx: UTI (urinary tract infection)    Hyperlipidemia    Hypertension    Hypothyroidism    Obesity    Pneumonia 1972   hx of   Polyp of colon    Serrated adenoma of colon 08/2012   Skin cancer    basal, squamous cell   Sleep apnea    uses cpap    Past Surgical History:  Procedure Laterality Date   BREAST LUMPECTOMY WITH RADIOACTIVE SEED AND SENTINEL LYMPH NODE BIOPSY Right 07/26/2020   Procedure: RIGHT BREAST LUMPECTOMY WITH RADIOACTIVE SEED AND SENTINEL LYMPH NODE BIOPSY;  Surgeon: Jovita Kussmaul, MD;  Location: [place];  Service: General;  Laterality: Right;   BREAST LUMPECTOMY WITH RADIOACTIVE SEED LOCALIZATION Left 07/26/2020   Procedure: LEFT BREAST LUMPECTOMY X 2  WITH RADIOACTIVE SEED LOCALIZATION;  Surgeon: Jovita Kussmaul, MD;  Location: [place];  Service: General;  Laterality: Left;   COLONOSCOPY     DILATION AND CURETTAGE OF [place]  ARTHROSCOPY     both in past   POLYPECTOMY     [place] Left 07/07/2013   Procedure: LEFT SHOULDER MINI OPEN SUBACROMIAL DECOMPRESSION ROTATOR CUFF REPAIR AND POSSIBLE PATCH GRAFT ;  Surgeon: Johnn Hai, MD;  Location[facility];  Service: Orthopedics;  Laterality: Left;  with interscaline block   SKIN CANCER EXCISION Bilateral    arm, legs, and chest   TONSILLECTOMY      There were no vitals filed for this visit.   Subjective Assessment - 02/26/21 1008     Subjective Pt says she is still having pain and swelling in her armpit.  She felt that the washcloth in armpit did not help.    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 She underwent a left lumpectomy for DCIS and a right lumpectomy and sentinel node biopsy on 07/26/2020. She had 2 lymph nodes removed with 1 being positive for cancer on the right side. It is ER/PR positive. Left shoulder rotator cuff repair 06/2013. Last radiation on 01/15/21.  Seroma that was drained x 2.    Patient  Stated Goals Check out the seroma    Currently in Pain? Yes    Pain Score 3     Pain Location Axilla    Pain Orientation Right    Pain Descriptors / Indicators Aching;Sore;Dull    Pain Type Acute pain    Pain Radiating Towards the breast    Pain Onset 1 to 4 weeks ago    Pain Frequency Intermittent    Aggravating Factors  pressing on it    Pain Relieving Factors taking the pressure off                               [facility] Adult PT Treatment/Exercise - 02/26/21 0001       Manual Therapy   Manual Therapy Manual Lymphatic Drainage (MLD)    Manual therapy comments pt with persistent redness and pain in seroma area. it is not warm    Edema Management sent an inbasket message to Marijean Niemann    Manual Lymphatic Drainage (MLD) short neck on left, with left axillary lymph nodes. superficial and deep abdominals right inguinal nodes with  right axillo-inguinal anastamosis.  Then to sidelying for posterior interaxillary anastamosis and right lateral trunk                           PT Long Term Goals - 02/21/21 1334       PT LONG TERM GOAL #1   Title Pt will report return to baseline of no resting axillary pain    Time 4    Period Weeks    Status New      PT LONG TERM GOAL #2   Title Pt will be independent in self MLD    Time 4    Period Weeks    Status New                   Plan - 02/26/21 1112     Clinical Impression Statement Pt with persistent painful seroma in left axilla with skin redness. Picture taken for chart. The area is not warm to touch. Inbasket sent to Marijean Niemann as pt was not able to get an appointment til Thursday. Perfomed MLD around area with extra time decongesting right lateral trunk in sidelying. Pt felt better at end of session.    Personal Factors and Comorbidities Age;Time since onset of injury/illness/exacerbation;Comorbidity 3+    Comorbidities Bilateral breast CA with Right SLNB, radiation,chemo, FMS,hypothyroidism    Stability/Clinical Decision Making Stable/Uncomplicated    PT Frequency 2x / week    PT Duration 4 weeks    PT Treatment/Interventions ADLs/Self Care Home Management;Therapeutic exercise;Patient/family education;Neuromuscular re-education;Balance training;Manual techniques;Manual lymph drainage;Passive range of motion;Taping    PT Next Visit Plan MLD to the Rt seroma near the axillary incision to work on softening and pain relief,    Consulted and Agree with Plan of Care Patient             Patient will benefit from skilled therapeutic intervention in order to improve the following deficits and impairments:  Decreased knowledge of precautions, Pain, Increased edema  Visit Diagnosis: Ductal carcinoma in situ (DCIS) of left breast  Aftercare following surgery for neoplasm  Localized edema  Abnormal posture  Muscle weakness  (generalized)     Problem List Patient Active Problem List   Diagnosis Date Noted   Malignant neoplasm of upper-outer quadrant of right breast in female,  estrogen receptor positive ([place]) 06/25/2020   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   Hepatic steatosis 11/27/2014   Hx of adenomatous colonic polyps 11/27/2014   Visit for preventive health examination 03/28/2014   Hyperlipidemia 03/28/2014   Left rotator cuff tear 07/07/2013   Tear of left rotator cuff 07/07/2013   Rhinitis 06/01/2013   Chest wall deformity 04/12/2012   Atrial septal aneurysm / if pfo  echo 6 13  10/23/2011   OSA (obstructive sleep apnea) 11/27/2010   Dyslipidemia 11/01/2010   Flushing 08/29/2010   Labile hypertension 08/29/2010   MUSCLE CRAMPS, FOOT 06/10/2010   OTHER SLEEP DISTURBANCES 06/10/2010   ANXIETY, SITUATIONAL 05/08/2010   VERTIGO, POSITIONAL 03/15/2010   VITAMIN D DEFICIENCY 02/18/2010   OBESITY 02/18/2010   ALLERGIC RHINITIS 02/18/2010   PLANTAR FASCIITIS 02/18/2010   TWITCHING 06/11/2009   NUMBNESS, HAND 06/11/2009   CERVICAL STRAIN, ACUTE 06/11/2009   OTHER MALAISE AND FATIGUE 07/09/2007   HYPERTENSION 01/28/2007   Hypothyroidism 11/11/2006   COMMON MIGRAINE 11/11/2006   GERD 11/11/2006   FIBROMYALGIA 11/11/2006   Helene Kelp K. Owens Shark PT  Norwood Levo, PT 02/26/2021, 11:16 AM  [facility] @ [place], [place], [address] Phone: [phone]   Fax:  [phone]  Name: [name] MRN: [id number] Date of Birth: [birth year]

## 2021-02-28 ENCOUNTER — Inpatient Hospital Stay (HOSPITAL_BASED_OUTPATIENT_CLINIC_OR_DEPARTMENT_OTHER): Payer: Medicare HMO | Admitting: Nurse Practitioner

## 2021-02-28 ENCOUNTER — Ambulatory Visit: Payer: Medicare HMO | Admitting: Rehabilitation

## 2021-02-28 ENCOUNTER — Other Ambulatory Visit: Payer: Self-pay

## 2021-02-28 ENCOUNTER — Encounter: Payer: Self-pay | Admitting: Nurse Practitioner

## 2021-02-28 VITALS — BP 158/84 | HR 86 | Temp 97.5°F | Resp 18 | Ht 66.0 in | Wt 205.4 lb

## 2021-02-28 DIAGNOSIS — Z9889 Other specified postprocedural states: Secondary | ICD-10-CM | POA: Diagnosis not present

## 2021-02-28 DIAGNOSIS — C50411 Malignant neoplasm of upper-outer quadrant of right female breast: Secondary | ICD-10-CM

## 2021-02-28 DIAGNOSIS — D0512 Intraductal carcinoma in situ of left breast: Secondary | ICD-10-CM | POA: Diagnosis not present

## 2021-02-28 DIAGNOSIS — M9971 Connective tissue and disc stenosis of intervertebral foramina of cervical region: Secondary | ICD-10-CM | POA: Diagnosis not present

## 2021-02-28 DIAGNOSIS — M4802 Spinal stenosis, cervical region: Secondary | ICD-10-CM | POA: Diagnosis not present

## 2021-02-28 DIAGNOSIS — Z8719 Personal history of other diseases of the digestive system: Secondary | ICD-10-CM | POA: Diagnosis not present

## 2021-02-28 DIAGNOSIS — M47812 Spondylosis without myelopathy or radiculopathy, cervical region: Secondary | ICD-10-CM | POA: Diagnosis not present

## 2021-02-28 DIAGNOSIS — F32A Depression, unspecified: Secondary | ICD-10-CM | POA: Diagnosis not present

## 2021-02-28 DIAGNOSIS — R69 Illness, unspecified: Secondary | ICD-10-CM | POA: Diagnosis not present

## 2021-02-28 DIAGNOSIS — Z85828 Personal history of other malignant neoplasm of skin: Secondary | ICD-10-CM | POA: Diagnosis not present

## 2021-02-28 DIAGNOSIS — G629 Polyneuropathy, unspecified: Secondary | ICD-10-CM | POA: Diagnosis not present

## 2021-02-28 DIAGNOSIS — I10 Essential (primary) hypertension: Secondary | ICD-10-CM | POA: Diagnosis not present

## 2021-02-28 DIAGNOSIS — R937 Abnormal findings on diagnostic imaging of other parts of musculoskeletal system: Secondary | ICD-10-CM | POA: Diagnosis not present

## 2021-02-28 DIAGNOSIS — K760 Fatty (change of) liver, not elsewhere classified: Secondary | ICD-10-CM | POA: Diagnosis not present

## 2021-02-28 DIAGNOSIS — E039 Hypothyroidism, unspecified: Secondary | ICD-10-CM | POA: Diagnosis not present

## 2021-02-28 DIAGNOSIS — M4312 Spondylolisthesis, cervical region: Secondary | ICD-10-CM | POA: Diagnosis not present

## 2021-02-28 DIAGNOSIS — R5383 Other fatigue: Secondary | ICD-10-CM | POA: Diagnosis not present

## 2021-02-28 DIAGNOSIS — Z86010 Personal history of colonic polyps: Secondary | ICD-10-CM | POA: Diagnosis not present

## 2021-02-28 DIAGNOSIS — D499 Neoplasm of unspecified behavior of unspecified site: Secondary | ICD-10-CM | POA: Diagnosis not present

## 2021-02-28 DIAGNOSIS — Z170 Estrogen receptor positive status [ER+]: Secondary | ICD-10-CM | POA: Diagnosis not present

## 2021-02-28 DIAGNOSIS — Z923 Personal history of irradiation: Secondary | ICD-10-CM | POA: Diagnosis not present

## 2021-02-28 DIAGNOSIS — E669 Obesity, unspecified: Secondary | ICD-10-CM | POA: Diagnosis not present

## 2021-02-28 DIAGNOSIS — Z9221 Personal history of antineoplastic chemotherapy: Secondary | ICD-10-CM | POA: Diagnosis not present

## 2021-02-28 DIAGNOSIS — E785 Hyperlipidemia, unspecified: Secondary | ICD-10-CM | POA: Diagnosis not present

## 2021-02-28 DIAGNOSIS — M797 Fibromyalgia: Secondary | ICD-10-CM | POA: Diagnosis not present

## 2021-02-28 NOTE — Progress Notes (Signed)
[place]   [phone] [phone]   Clinic Follow up Note   Patient Care Team: Panosh, Standley Brooking, MD as PCP - General Arvella Nigh, MD as Consulting Physician (Obstetrics and Gynecology) Dr Peggye Form, MD as Consulting Physician (Orthopedic Surgery) Ladene Artist, MD as Consulting Physician (Gastroenterology) Melina Schools, MD as Consulting Physician (Orthopedic Surgery) Rockwell Germany, RN as Oncology Nurse Navigator Mauro Kaufmann, RN as Oncology Nurse Navigator Jovita Kussmaul, MD as Consulting Physician (General Surgery) Truitt Merle, MD as Consulting Physician (Hematology) Eppie Gibson, MD as Attending Physician (Radiation Oncology) Jari Pigg, MD as Consulting Physician (Dermatology) Serita Kyle, MD as Referring Physician (Cardiology) 02/28/2021  CHIEF COMPLAINT: 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 and concern for right axillary cellulitis  SUMMARY OF ONCOLOGIC HISTORY: Oncology History Overview Note  Cancer Staging Ductal carcinoma in situ (DCIS) of left breast Staging form: Breast, AJCC 8th Edition - Clinical stage from 06/21/2020: Stage 0 (cTis (DCIS), cN0, cM0, G2, ER+, PR+, HER2: Not Assessed) - Signed by Truitt Merle, MD on 06/26/2020 Stage prefix: Initial diagnosis  Malignant neoplasm of upper-outer quadrant of right breast in female, estrogen receptor positive ([place]) Staging form: Breast, AJCC 8th Edition - Clinical stage from 06/21/2020: Stage IA (cT1b, cN0, cM0, G2, ER+, PR+, HER2-) - Signed by Truitt Merle, MD on 06/26/2020 Stage prefix: Initial diagnosis - Pathologic stage from 07/26/2020: Stage IA (pT1c, pN1a, cM0, G2, ER+, PR+, HER2-) - Signed by Truitt Merle, MD on 08/09/2020 Stage prefix: Initial diagnosis Nuclear grade: G2 Histologic grading system: 3 grade system Residual tumor (R): R0 - None    Malignant neoplasm of upper-outer quadrant of right breast in female, estrogen receptor positive ([place])   06/08/2020 Mammogram   IMPRESSION: 1. 9 x 7 x 6 mm mass in the 12 o'clock position of the right breast, 2cmfn with imaging features highly suspicious for malignancy. 2. 4 mm group of indeterminate calcifications in the 12 o'clock position of the left breast and 4 mm group of indeterminate calcifications in the 1 o'clock position of the left breast. Together, the groups span an area measuring 3.9 cm.   06/21/2020 Cancer Staging   Staging form: Breast, AJCC 8th Edition - Clinical stage from 06/21/2020: Stage IA (cT1b, cN0, cM0, G2, ER+, PR+, HER2-) - Signed by Truitt Merle, MD on 06/26/2020 Stage prefix: Initial diagnosis   06/21/2020 Initial Biopsy   Diagnosis 1. Breast, right, needle core biopsy, 12 oc - INVASIVE MAMMARY CARCINOMA - MAMMARY CARCINOMA IN SITU - SEE COMMENT 2. Breast, left, needle core biopsy, 12 oc - MAMMARY CARCINOMA IN-SITU WITH NECROSIS AND CALCIFICATIONS - SEE COMMENT 3. Breast, left, needle core biopsy, 1 oc - MAMMARY CARCINOMA IN-SITU WITH NECROSIS AND CALCIFICATIONS - SEE COMMENT Microscopic Comment 1. The biopsy material shows an infiltrative proliferation of cells with large vesicular nuclei with inconspicuous nucleoli, arranged linearly and in small clusters. Based on the biopsy, the carcinoma appears Nottingham grade 2 of 3.  Addendum: 1. E-cadherin is POSITIVE supporting a ductal origin. 2. E-cadherin is POSITIVE supporting a ductal origin.  3. E-cadherin is positive supporting a ductal origin. The focus is less pronounced on the deeper sections and in isolation would likely be considered atypical ductal hyperplasia.   06/21/2020 Receptors her2   1. PROGNOSTIC INDICATORS Results: IMMUNOHISTOCHEMICAL AND MORPHOMETRIC ANALYSIS PERFORMED MANUALLY The tumor cells are EQUIVOCAL for Her2 (2+). HER2 by FISH will be performed and the results reported separately Estrogen Receptor: 95%, POSITIVE, STRONG  STAINING INTENSITY Progesterone Receptor: 40%, POSITIVE,  MODERATE STAINING INTENSITY Proliferation Marker Ki67: 10%  1. FLUORESCENCE IN-SITU HYBRIDIZATION Results: GROUP 5: HER2 **NEGATIVE** Equivocal form of amplification of the HER2 gene was detected in the IHC 2+ tissue sample received from this individual. HER2 FISH was performed by a technologist and cell imaging and analysis on the [employer].   06/25/2020 Initial Diagnosis   Malignant neoplasm of upper-outer quadrant of right breast in female, estrogen receptor positive ([place])   07/03/2020 Breast MRI   IMPRESSION: 1. Known RIGHT breast cancer, 12 o'clock axis, at anterior depth, measuring 1 cm greatest extent, manifesting as a spiculated enhancing mass on MRI, with associated biopsy clip. Expected post biopsy changes are seen within the adjacent outer RIGHT breast. 2. No evidence of additional multifocal or multicentric disease within the RIGHT breast. 3. Known LEFT breast DCIS within the slightly outer LEFT breast, at anterior depth, corresponding to the biopsy site labeled 1 o'clock axis, with associated enhancement only at the margins of the biopsy cavity measuring up to 5 mm greatest dimension. 4. Known LEFT breast DCIS within the upper central LEFT breast, at middle depth, corresponding to the biopsy site labeled 12 o'clock axis. Contiguous linear non-mass enhancement extends 2.3 cm superior-medial to the biopsy cavity, most likely post biopsy change but possibly contiguous extent of disease. 5. No evidence of additional multifocal or multicentric disease within the LEFT breast. 6. No evidence of metastatic lymphadenopathy.     07/26/2020 Cancer Staging   Staging form: Breast, AJCC 8th Edition - Pathologic stage from 07/26/2020: Stage IA (pT1c, pN1a, cM0, G2, ER+, PR+, HER2-) - Signed by Truitt Merle, MD on 08/09/2020 Stage prefix: Initial diagnosis Nuclear grade: G2 Histologic grading system: 3 grade system Residual tumor (R): R0 - None   07/26/2020 Surgery   RIGHT BREAST LUMPECTOMY  WITH RADIOACTIVE SEED AND SENTINEL LYMPH NODE BIOPSY by Dr Marlou Starks   07/26/2020 Pathology Results   FINAL MICROSCOPIC DIAGNOSIS:   A. LYMPH NODE, RIGHT AXILLARY #1, SENTINEL, EXCISION:  - Lymph node, negative for carcinoma (0/1)   B. LYMPH NODE, RIGHT AXILLARY, SENTINEL, EXCISION:  - Benign fibroadipose tissue, negative for carcinoma   C. BREAST, RIGHT, LUMPECTOMY:  - Invasive ductal carcinoma, 1.5 cm, grade 2  - Ductal carcinoma in situ, low grade  - Resection margins are negative for carcinoma; closest is the anterior margin of 0.2 cm  - Biopsy site changes  - See oncology table   D. BREAST, LEFT, LUMPECTOMY:  - Benign breast parenchyma with prominent biopsy-related changes  - Negative for residual ductal carcinoma in situ  - See oncology table   E. LYMPH NODE, RIGHT AXILLARY #2, SENTINEL, EXCISION:  - Invasive ductal carcinoma, see comment  COMMENT:  E.  Lymph node tissue is not identified.  Findings likely represent an entirely replaced lymph node with foci of extranodal extension.    07/26/2020 Miscellaneous   Mammaprint High Risk of Luminla Type B 29% risk of recurrence in 10 years if untreated.  Her Mammaprint index is -0.175 She has 94.6% benefit of chemotherapy and hormaonal therapy.      08/20/2020 Imaging   CT C/A/P IMPRESSION: 1. No definitive imaging findings to suggest metastatic disease in the chest, abdomen or pelvis. 2. Postoperative changes of bilateral lumpectomy and right axillary lymph node dissection with what appears to be a large postoperative seroma in the right axilla, as detailed above. Attention on follow-up studies is recommended to ensure the stability or regression of this collection. 3. Additional incidental  findings, as above.   08/28/2020 Imaging   Bone Scan IMPRESSION: No definite scintigraphic evidence of osseous metastases.   08/30/2020 - 11/02/2020 Chemotherapy   Adjuvant Docetaxel and Cytoxan q3weeks for 4 cycles starting 08/30/20.  Given skin rash, changes taxol to Abraxane starting with C2 (09/21/20). Completed 11/02/20.    12/03/2020 - 01/15/2021 Radiation Therapy   Bilateral breast radiation and right regional lymph nodes   Ductal carcinoma in situ (DCIS) of left breast  06/08/2020 Mammogram   IMPRESSION: 1. 9 x 7 x 6 mm mass in the 12 o'clock position of the right breast, 2cmfn with imaging features highly suspicious for malignancy. 2. 4 mm group of indeterminate calcifications in the 12 o'clock position of the left breast and 4 mm group of indeterminate calcifications in the 1 o'clock position of the left breast. Together, the groups span an area measuring 3.9 cm.   06/21/2020 Cancer Staging   Staging form: Breast, AJCC 8th Edition - Clinical stage from 06/21/2020: Stage 0 (cTis (DCIS), cN0, cM0, G2, ER+, PR+, HER2: Not Assessed) - Signed by Truitt Merle, MD on 06/26/2020 Stage prefix: Initial diagnosis   06/21/2020 Initial Biopsy   Diagnosis 1. Breast, right, needle core biopsy, 12 oc - INVASIVE MAMMARY CARCINOMA - MAMMARY CARCINOMA IN SITU - SEE COMMENT 2. Breast, left, needle core biopsy, 12 oc - MAMMARY CARCINOMA IN-SITU WITH NECROSIS AND CALCIFICATIONS - SEE COMMENT 3. Breast, left, needle core biopsy, 1 oc - MAMMARY CARCINOMA IN-SITU WITH NECROSIS AND CALCIFICATIONS - SEE COMMENT Microscopic Comment 1. The biopsy material shows an infiltrative proliferation of cells with large vesicular nuclei with inconspicuous nucleoli, arranged linearly and in small clusters. Based on the biopsy, the carcinoma appears Nottingham grade 2 of 3.  Addendum: 1. E-cadherin is POSITIVE supporting a ductal origin. 2. E-cadherin is POSITIVE supporting a ductal origin.  3. E-cadherin is positive supporting a ductal origin. The focus is less pronounced on the deeper sections and in isolation would likely be considered atypical ductal hyperplasia.   06/21/2020 Receptors her2   2. PROGNOSTIC INDICATORS Results: IMMUNOHISTOCHEMICAL  AND MORPHOMETRIC ANALYSIS PERFORMED MANUALLY Estrogen Receptor: 95%, POSITIVE, STRONG STAINING INTENSITY Progesterone Receptor: 30%, POSITIVE, STRONG STAINING INTENSITY   06/25/2020 Initial Diagnosis   Ductal carcinoma in situ (DCIS) of left breast   07/03/2020 Breast MRI   IMPRESSION: 1. Known RIGHT breast cancer, 12 o'clock axis, at anterior depth, measuring 1 cm greatest extent, manifesting as a spiculated enhancing mass on MRI, with associated biopsy clip. Expected post biopsy changes are seen within the adjacent outer RIGHT breast. 2. No evidence of additional multifocal or multicentric disease within the RIGHT breast. 3. Known LEFT breast DCIS within the slightly outer LEFT breast, at anterior depth, corresponding to the biopsy site labeled 1 o'clock axis, with associated enhancement only at the margins of the biopsy cavity measuring up to 5 mm greatest dimension. 4. Known LEFT breast DCIS within the upper central LEFT breast, at middle depth, corresponding to the biopsy site labeled 12 o'clock axis. Contiguous linear non-mass enhancement extends 2.3 cm superior-medial to the biopsy cavity, most likely post biopsy change but possibly contiguous extent of disease. 5. No evidence of additional multifocal or multicentric disease within the LEFT breast. 6. No evidence of metastatic lymphadenopathy.     07/26/2020 Surgery   LEFT BREAST LUMPECTOMY X 2  WITH RADIOACTIVE SEED LOCALIZATION by Dr Marlou Starks   07/26/2020 Pathology Results   FINAL MICROSCOPIC DIAGNOSIS:   A. LYMPH NODE, RIGHT AXILLARY #1, SENTINEL, EXCISION:  - Lymph  node, negative for carcinoma (0/1)   B. LYMPH NODE, RIGHT AXILLARY, SENTINEL, EXCISION:  - Benign fibroadipose tissue, negative for carcinoma   C. BREAST, RIGHT, LUMPECTOMY:  - Invasive ductal carcinoma, 1.5 cm, grade 2  - Ductal carcinoma in situ, low grade  - Resection margins are negative for carcinoma; closest is the anterior margin of 0.2 cm  - Biopsy site  changes  - See oncology table   D. BREAST, LEFT, LUMPECTOMY:  - Benign breast parenchyma with prominent biopsy-related changes  - Negative for residual ductal carcinoma in situ  - See oncology table   E. LYMPH NODE, RIGHT AXILLARY #2, SENTINEL, EXCISION:  - Invasive ductal carcinoma, see comment  COMMENT:  E.  Lymph node tissue is not identified.  Findings likely represent an entirely replaced lymph node with foci of extranodal extension.    12/03/2020 - 01/15/2021 Radiation Therapy   Bilateral breast radiation and right regional lymph nodes     CURRENT THERAPY: Completed adjuvant chemotherapy with TC every 3 weeks x4, (Taxotere changed to Abraxane with cycle 3) and adjuvant radiation completed 01/2021 then began antiestrogen therapy with exemestane  INTERVAL HISTORY: [name] returns for follow-up as scheduled.  She was last seen by Dr. Burr Medico during radiation 01/10/2021 she completed at the end of the month.  He began exemestane and continues PT.  She was seen earlier this week with concern for right axillary cellulitis over the known seroma with skin erythema and tenderness.  She was not able to be seen at that day or 10/19 so I prescribed empiric antibiotics with doxycycline which she has been taking.  Redness has improved slightly but tenderness has not.  Denies fever or chills.  Right breast feels swollen and heavy compared to left, she does have intermittent pain in the left breast but only when compressing.  Denies nipple discharge or inversion.  She reports fatigue and brain fog which impact her ability to exercise.  She has generalized joint aches and joint "tiredness."  She had fibromyalgia but was able to recover with exercise in the past.  She has mild intermittent neuropathy in certain toes, no fall.  She has had a dry cough since radiation, she does have allergies but this is not new for her.  Denies chest pain, dyspnea.  She had reflux in the past.  She remains sexually active with  her partner.  Bowels moving normally.  She is in the anger stage of her emotional response to breast cancer diagnosis and treatment.  She is grieving how much this intruded in her normal life.   MEDICAL HISTORY:  Past Medical History:  Diagnosis Date   Anal fissure    Atrial septal aneurysm / if pfo  echo 6 13  10/23/2011   Breast cancer ([place])    Cataract    both eyes   Depression    Fatty liver    "pre fatty liver"   Fibromyalgia    Headache(784.0)    hx of migraines when younger   Heart palpitations    hx with normal holter event monitoring   Hx: UTI (urinary tract infection)    Hyperlipidemia    Hypertension    Hypothyroidism    Obesity    Pneumonia 1972   hx of   Polyp of colon    Serrated adenoma of colon 08/2012   Skin cancer    basal, squamous cell   Sleep apnea    uses cpap    SURGICAL HISTORY: Past Surgical History:  Procedure Laterality Date   BREAST LUMPECTOMY WITH RADIOACTIVE SEED AND SENTINEL LYMPH NODE BIOPSY Right 07/26/2020   Procedure: RIGHT BREAST LUMPECTOMY WITH RADIOACTIVE SEED AND SENTINEL LYMPH NODE BIOPSY;  Surgeon: Jovita Kussmaul, MD;  Location: [facility];  Service: General;  Laterality: Right;   BREAST LUMPECTOMY WITH RADIOACTIVE SEED LOCALIZATION Left 07/26/2020   Procedure: LEFT BREAST LUMPECTOMY X 2  WITH RADIOACTIVE SEED LOCALIZATION;  Surgeon: Jovita Kussmaul, MD;  Location: [facility];  Service: General;  Laterality: Left;   COLONOSCOPY     DILATION AND CURETTAGE OF [place] ARTHROSCOPY     both in past   POLYPECTOMY     SHOULDER OPEN [place] Left 07/07/2013   Procedure: LEFT SHOULDER MINI OPEN SUBACROMIAL DECOMPRESSION ROTATOR CUFF REPAIR AND POSSIBLE PATCH GRAFT ;  Surgeon: Johnn Hai, MD;  Location: [facility];  Service: Orthopedics;  Laterality: Left;  with interscaline block   SKIN CANCER EXCISION Bilateral    arm, legs, and chest   TONSILLECTOMY      I have reviewed the social history  and family history with the patient and they are unchanged from previous note.  ALLERGIES:  is allergic to penicillins, taxotere [docetaxel], cefdinir, norvasc [amlodipine besylate], cetirizine, irbesartan, molds & smuts, pegfilgrastim, and tylenol [acetaminophen].  MEDICATIONS:  Current Outpatient Medications  Medication Sig Dispense Refill   Ascorbic Acid (VITAMIN C) 1000 MG tablet Take 1,000 mg by mouth daily.     b complex vitamins capsule Take 1 capsule by mouth daily.     Bioflavonoid Products (QUERCETIN COMPLEX IMMUNE PO) Take 1 capsule by mouth daily.     Cholecalciferol (VITAMIN D) 50 MCG (2000 UT) tablet Take 4,000 Units by mouth daily.     dexamethasone (DECADRON) 4 MG tablet Take 2 tablets (8 mg total) by mouth 2 (two) times daily. Start the day before Taxotere. Then again the day after chemo for 3 days. 30 tablet 1   doxycycline (VIBRA-TABS) 100 MG tablet Take 1 tablet (100 mg total) by mouth 2 (two) times daily. 14 tablet 0   exemestane (AROMASIN) 25 MG tablet Take 1 tablet (25 mg total) by mouth daily after breakfast. 30 tablet 1   hydrochlorothiazide (HYDRODIURIL) 25 MG tablet TAKE 1 TABLET BY MOUTH EVERY DAY 90 tablet 0   hydroxychloroquine (PLAQUENIL) 200 MG tablet Take 200 mg by mouth 2 (two) times a week.     lidocaine-prilocaine (EMLA) cream Apply to affected area once 30 g 3   LORazepam (ATIVAN) 1 MG tablet Take 0.5-1 tablets (0.5-1 mg total) by mouth every 8 (eight) hours as needed for anxiety. anxiety 20 tablet 0   MAGNESIUM CARBONATE PO Take 1 tablet by mouth daily.     melatonin 5 MG TABS Take 5 mg by mouth at bedtime.     Menaquinone-7 (VITAMIN K2) 100 MCG CAPS Take 100 mcg by mouth daily.     metoprolol succinate (TOPROL XL) 25 MG 24 hr tablet TAKE ONCE A DAY AS DIRECTED (Patient taking differently: Take by mouth daily as needed (Blood pressure). TAKE ONCE A DAY AS DIRECTED) 30 tablet 1   Multiple Vitamin (MULTIVITAMIN) tablet Take 1 tablet by mouth daily.     NON  FORMULARY Take 48.6 mg by mouth See admin instructions. Thyroid desiccated (procine) sr 2 capsules 5 days a week, 3 capsules 2 days per week (Tues and Fri)     OLIVE LEAF EXTRACT PO Take 1  capsule by mouth daily.     ondansetron (ZOFRAN) 8 MG tablet Take 1 tablet (8 mg total) by mouth 2 (two) times daily as needed for refractory nausea / vomiting. Start on day 3 after chemo. 30 tablet 1   OVER THE COUNTER MEDICATION Take 2 capsules by mouth in the morning and at bedtime. Ifnla Med     OVER THE COUNTER MEDICATION Take 1 capsule by mouth daily. Essential Pro     potassium chloride SA (KLOR-CON) 20 MEQ tablet TAKE ONE TABLET BY MOUTH ONE TIME DAILY 90 tablet 0   Probiotic Product (PROBIOTIC PO) Take 1 capsule by mouth daily.     prochlorperazine (COMPAZINE) 10 MG tablet Take 1 tablet (10 mg total) by mouth every 6 (six) hours as needed (Nausea or vomiting). 30 tablet 1   traMADol (ULTRAM) 50 MG tablet Take 1-2 tablets (50-100 mg total) by mouth every 6 (six) hours as needed. 20 tablet 1   UBIQUINOL PO Take by mouth.     VITAMIN E PO Take 23 mg by mouth daily.     ZINC GLUCONATE PO Take 10 mg by mouth daily.     No current facility-administered medications for this visit.    PHYSICAL EXAMINATION: ECOG PERFORMANCE STATUS: 1 - Symptomatic but completely ambulatory  Vitals:   02/28/21 0921  BP: (!) 158/84  Pulse: 86  Resp: 18  Temp: (!) 97.5 F (36.4 C)  SpO2: 100%   Filed Weights   02/28/21 0921  Weight: 205 lb 6.4 oz (93.2 kg)    GENERAL:alert, no distress and comfortable SKIN: No rash EYES: sclera clear NECK: Without mass LYMPH:  no palpable lymphadenopathy in the cervical, axillary or inguinal LUNGS: clear to auscultation and percussion with normal breathing effort HEART: regular rate & rhythm and no murmurs and no lower extremity edema ABDOMEN:abdomen soft, non-tender and normal bowel sounds Musculoskeletal:no cyanosis of digits and no clubbing  NEURO: alert & oriented x 3  with fluent speech, no focal motor/sensory deficits Breast:'s are symmetrical without nipple discharge or inversion.  S/p bilateral lumpectomy and radiation, incisions completely healed.  Right breast with large axillary seroma and mild overlying streaking.  Right breast with mild erythema and lymphedema.  No palpable mass or nodularity in either breast or axilla that I could appreciate except seroma  LABORATORY DATA:  I have reviewed the data as listed CBC Latest Ref Rng & Units 01/10/2021 11/30/2020 11/02/2020  WBC 4.0 - 10.5 K/uL 5.3 7.0 6.1  Hemoglobin 12.0 - 15.0 g/dL 12.8 12.1 12.3  Hematocrit 36.0 - 46.0 % 37.1 36.2 36.4  Platelets 150 - 400 K/uL 156 194 216     CMP Latest Ref Rng & Units 01/10/2021 11/30/2020 11/02/2020  Glucose 70 - 99 mg/dL 90 95 86  BUN 8 - 23 mg/dL $Remove'12 15 18  'KwHxSCo$ Creatinine 0.44 - 1.00 mg/dL 0.69 0.79 0.71  Sodium 135 - 145 mmol/L 140 143 143  Potassium 3.5 - 5.1 mmol/L 3.6 3.6 4.6  Chloride 98 - 111 mmol/L 105 103 108  CO2 22 - 32 mmol/L $RemoveB'26 30 24  'GAbvNgdK$ Calcium 8.9 - 10.3 mg/dL 9.5 9.7 9.5  Total Protein 6.5 - 8.1 g/dL 6.8 6.9 7.2  Total Bilirubin 0.3 - 1.2 mg/dL 0.7 0.8 0.5  Alkaline Phos 38 - 126 U/L 110 91 153(H)  AST 15 - 41 U/L 26 25 49(H)  ALT 0 - 44 U/L 47(H) 34 95(H)      RADIOGRAPHIC STUDIES: I have personally reviewed the radiological images as  listed and agreed with the findings in the report. No results found.   ASSESSMENT & PLAN: [name] is a 65 y.o. female with    1. Malignant neoplasm of upper-outer quadrant of right breast, Stage IB, p(T1cN1aM0), ER+/PR+/HER2-, Grade II AND Left breast DCIS, grade II, ER+/PR+ (removed by biopsy) -Diagnosed in 06/2020. S/p B/l lumpectomy and right SNLB by Dr Marlou Starks on 07/26/20. Her surgical path showed her left breast with no residual DCIS on path.  Right breast with grade II, 1.5cm invasive ductal carcinoma with clear margins and 1/2 positive LNs.  -Given positive LN, Mammaprint was done and results show high  risk of Luminal Type B with 29% risk of distant recurrence. She has 94.6% chance of 5-year recurrence free with chemotherapy and hormonal therapy.  -Staging CT C/A/P on 08/20/20 and bone scan on 08/28/20 were negative for metastatic disease. -She completed adjuvant docetaxel and Cytoxan every 3 weeks x4 .  Except here changed to Abraxane with cycle 3, completed chemo on 08/30/2020 -She completed adjuvant radiation on 01/15/2021 -Began exemestane 02/2021, tolerating well with mild joint pain -Has residual fatigue and brain fog from chemo breast cancer treatment but otherwise recovering expectedly  2.  Bone health -Per patient last DEXA 03/2019 at Dr. Ophelia Charter office showed osteopenia, repeat every 2 years  3. Comorbidities: Depression, Fibromyalgia, HLD, HTN, Hypothyroidism, H/o Skin cancer (squamous and basal cell), sleep apnea  -She notes having Adrenal fatigue, Dr. Burr Medico previously recommend she see endocrinologist -On medication, follow-up with medical care team  4. Genetic testing, she will think about it.    5.  Fatty liver and transaminitis -She has chronic intermittent transaminitis, which she attributes to fatty liver.  She is on low sugar diet and drinks a lot of water, and takes glutathione  -Liver morphology was unremarkable on staging CT scan -She does not drink alcohol, she knows to avoid Tylenol. -Improved  Disposition: [name] appears stable.  She is gradually recovering from adjuvant chemo and radiation.  Right breast shows large axillary seroma with mild overlying erythema.  Due to high risk for cellulitis, prescribed empiric antibiotics with doxycycline on 02/26/2021, complete the full course. She will follow-up with Dr. Marlou Starks later today in his office.  She began exemestane in 02/2021, tolerating with mild joint aches.  We reviewed risk and benefit of AI, she will tolerate it and continue.  We discussed her diagnosis, treatment, recovery trajectory, recurrence risk, and  risk reducing/preventative measures in great detail.  I explained the mechanism of action, side effects, symptom management, and potential benefit of exemestane/AI's.  I provided emotional support and let her grieve her breast cancer experience.  I answered other questions to her satisfaction.   We will see her back for survivorship as scheduled 04/11/2021, or sooner if needed.  The patient knows to call the clinic with any problems, questions or concerns. No barriers to learning were detected.  Total encounter time was 40 minutes.     Alla Feeling, NP 02/28/21

## 2021-03-01 ENCOUNTER — Other Ambulatory Visit: Payer: Self-pay | Admitting: Student

## 2021-03-01 DIAGNOSIS — Z9889 Other specified postprocedural states: Secondary | ICD-10-CM

## 2021-03-04 ENCOUNTER — Other Ambulatory Visit: Payer: Self-pay | Admitting: Student

## 2021-03-04 DIAGNOSIS — Z9889 Other specified postprocedural states: Secondary | ICD-10-CM

## 2021-03-04 DIAGNOSIS — M9903 Segmental and somatic dysfunction of lumbar region: Secondary | ICD-10-CM | POA: Diagnosis not present

## 2021-03-04 DIAGNOSIS — M9901 Segmental and somatic dysfunction of cervical region: Secondary | ICD-10-CM | POA: Diagnosis not present

## 2021-03-04 DIAGNOSIS — M9902 Segmental and somatic dysfunction of thoracic region: Secondary | ICD-10-CM | POA: Diagnosis not present

## 2021-03-04 DIAGNOSIS — N6489 Other specified disorders of breast: Secondary | ICD-10-CM

## 2021-03-04 DIAGNOSIS — N63 Unspecified lump in unspecified breast: Secondary | ICD-10-CM

## 2021-03-04 DIAGNOSIS — M531 Cervicobrachial syndrome: Secondary | ICD-10-CM | POA: Diagnosis not present

## 2021-03-05 DIAGNOSIS — H35033 Hypertensive retinopathy, bilateral: Secondary | ICD-10-CM | POA: Diagnosis not present

## 2021-03-05 DIAGNOSIS — H524 Presbyopia: Secondary | ICD-10-CM | POA: Diagnosis not present

## 2021-03-06 ENCOUNTER — Other Ambulatory Visit: Payer: Self-pay

## 2021-03-06 ENCOUNTER — Ambulatory Visit
Admission: RE | Admit: 2021-03-06 | Discharge: 2021-03-06 | Disposition: A | Discharge status: Home / Self Care | Payer: Medicare HMO | Source: Ambulatory Visit | Attending: Radiation Oncology | Admitting: Radiation Oncology

## 2021-03-06 ENCOUNTER — Ambulatory Visit: Payer: Medicare HMO | Admitting: Radiation Oncology

## 2021-03-06 VITALS — BP 147/76 | HR 90 | Temp 97.8°F | Resp 20 | Wt 202.6 lb

## 2021-03-06 DIAGNOSIS — D0512 Intraductal carcinoma in situ of left breast: Secondary | ICD-10-CM | POA: Insufficient documentation

## 2021-03-06 DIAGNOSIS — Z170 Estrogen receptor positive status [ER+]: Secondary | ICD-10-CM | POA: Insufficient documentation

## 2021-03-06 DIAGNOSIS — C50411 Malignant neoplasm of upper-outer quadrant of right female breast: Secondary | ICD-10-CM | POA: Diagnosis not present

## 2021-03-06 DIAGNOSIS — F418 Other specified anxiety disorders: Secondary | ICD-10-CM | POA: Diagnosis not present

## 2021-03-06 DIAGNOSIS — R69 Illness, unspecified: Secondary | ICD-10-CM | POA: Diagnosis not present

## 2021-03-06 NOTE — Progress Notes (Signed)
Patient presents today for follow-up and to discuss a few lingering concerns since completing radiation to her right and left breast on 01/15/2021  Saw Lacie Burton-NP last Thursday who noted "Right breast with large axillary seroma and mild overlying streaking.  Right breast with mild erythema and lymphedema.  No palpable mass or nodularity in either breast or axilla that I could appreciate except seroma" Patient reports right right breast feels fuller than left, and nipple/areola is firm and uncomfortable. Scheduled to have seroma evaluated/possibly drained, by breast surgeon's office on 03/12/21. States the skin erythema and discomfort around seroma seemed to start when she began wearing a compression bra per PT's recommendation. Reports new discomfort to her right arm when she raises her arm above her head (experiences numbness sensation in bicep that extends down the lateral aspect of her arm to her pinky finger). Reports a newer pain above her left breast that she is having evaluated by a cardiologist on 03/22/21. Continuing to take exemestane, but considering stopping (or asking to change to another AI) due to side effects. Reports she was told her thyroid function dropped significantly after radiation, and is scheduled to see a holistic physician to discuss management options.

## 2021-03-11 ENCOUNTER — Ambulatory Visit: Payer: Medicare HMO

## 2021-03-11 ENCOUNTER — Encounter: Payer: Self-pay | Admitting: Radiation Oncology

## 2021-03-11 ENCOUNTER — Other Ambulatory Visit: Payer: Self-pay

## 2021-03-11 DIAGNOSIS — Z483 Aftercare following surgery for neoplasm: Secondary | ICD-10-CM | POA: Diagnosis not present

## 2021-03-11 DIAGNOSIS — D0512 Intraductal carcinoma in situ of left breast: Secondary | ICD-10-CM | POA: Diagnosis not present

## 2021-03-11 DIAGNOSIS — R293 Abnormal posture: Secondary | ICD-10-CM | POA: Diagnosis not present

## 2021-03-11 DIAGNOSIS — R600 Localized edema: Secondary | ICD-10-CM

## 2021-03-11 DIAGNOSIS — M6281 Muscle weakness (generalized): Secondary | ICD-10-CM | POA: Diagnosis not present

## 2021-03-11 NOTE — Progress Notes (Signed)
Radiation Oncology         [phone] ________________________________  Name: [name] MRN: [id number]  Date: 03/06/2021  DOB: 05-Mar-1956  Follow-Up Visit Note  Outpatient  CC: Panosh, Standley Brooking, MD  Regis Bill Standley Brooking, MD  Diagnosis and Prior Radiotherapy:    ICD-10-CM   1. Malignant neoplasm of upper-outer quadrant of right breast in female, estrogen receptor positive ([place])  C50.411    Z17.0     2. Ductal carcinoma in situ (DCIS) of left breast  D05.12       CHIEF COMPLAINT: Here for follow-up and surveillance of breast cancer  Narrative:  The patient returns today for routine follow-up.  Patient presents today for follow-up and to discuss a few lingering concerns since completing radiation to her right and left breast on 01/15/2021  Saw Lacie Burton-NP last Thursday who noted "Right breast with large axillary seroma and mild overlying streaking.  Right breast with mild erythema and lymphedema.  No palpable mass or nodularity in either breast or axilla that I could appreciate except seroma" Patient reports right right breast feels fuller than left, and nipple/areola is firm and uncomfortable. Scheduled to have seroma evaluated/possibly drained, by breast surgeon's office on 03/12/21. States the skin erythema and discomfort around seroma seemed to start when she began wearing a compression bra per PT's recommendation. Reports new discomfort to her right arm when she raises her arm above her head (experiences numbness sensation in bicep that extends down the lateral aspect of her arm to her pinky finger). Reports a newer pain above her left breast that she is having evaluated by a cardiologist on 03/22/21. Continuing to take exemestane, but considering stopping (or asking to change to another AI) due to side effects. Reports she was told her thyroid function dropped significantly after radiation, and is scheduled to see a holistic physician to discuss management options.                                ALLERGIES:  is allergic to penicillins, taxotere [docetaxel], cefdinir, norvasc [amlodipine besylate], cetirizine, irbesartan, molds & smuts, pegfilgrastim, and tylenol [acetaminophen].  Meds: Current Outpatient Medications  Medication Sig Dispense Refill   Ascorbic Acid (VITAMIN C) 1000 MG tablet Take 1,000 mg by mouth daily.     b complex vitamins capsule Take 1 capsule by mouth daily.     Bioflavonoid Products (QUERCETIN COMPLEX IMMUNE PO) Take 1 capsule by mouth daily.     Cholecalciferol (VITAMIN D) 50 MCG (2000 UT) tablet Take 4,000 Units by mouth daily.     dexamethasone (DECADRON) 4 MG tablet Take 2 tablets (8 mg total) by mouth 2 (two) times daily. Start the day before Taxotere. Then again the day after chemo for 3 days. 30 tablet 1   doxycycline (VIBRA-TABS) 100 MG tablet Take 1 tablet (100 mg total) by mouth 2 (two) times daily. 14 tablet 0   exemestane (AROMASIN) 25 MG tablet Take 1 tablet (25 mg total) by mouth daily after breakfast. 30 tablet 1   hydrochlorothiazide (HYDRODIURIL) 25 MG tablet TAKE 1 TABLET BY MOUTH EVERY DAY 90 tablet 0   hydroxychloroquine (PLAQUENIL) 200 MG tablet Take 200 mg by mouth 2 (two) times a week.     lidocaine-prilocaine (EMLA) cream Apply to affected area once 30 g 3   LORazepam (ATIVAN) 1 MG tablet Take 0.5-1 tablets (0.5-1 mg total) by mouth every 8 (eight) hours as needed for  anxiety. anxiety 20 tablet 0   MAGNESIUM CARBONATE PO Take 1 tablet by mouth daily.     melatonin 5 MG TABS Take 5 mg by mouth at bedtime.     Menaquinone-7 (VITAMIN K2) 100 MCG CAPS Take 100 mcg by mouth daily.     metoprolol succinate (TOPROL XL) 25 MG 24 hr tablet TAKE ONCE A DAY AS DIRECTED (Patient taking differently: Take by mouth daily as needed (Blood pressure). TAKE ONCE A DAY AS DIRECTED) 30 tablet 1   Multiple Vitamin (MULTIVITAMIN) tablet Take 1 tablet by mouth daily.     NON FORMULARY Take 48.6 mg by mouth See admin instructions. Thyroid  desiccated (procine) sr 2 capsules 5 days a week, 3 capsules 2 days per week (Tues and Fri)     OLIVE LEAF EXTRACT PO Take 1 capsule by mouth daily.     ondansetron (ZOFRAN) 8 MG tablet Take 1 tablet (8 mg total) by mouth 2 (two) times daily as needed for refractory nausea / vomiting. Start on day 3 after chemo. 30 tablet 1   OVER THE COUNTER MEDICATION Take 2 capsules by mouth in the morning and at bedtime. Ifnla Med     OVER THE COUNTER MEDICATION Take 1 capsule by mouth daily. Essential Pro     potassium chloride SA (KLOR-CON) 20 MEQ tablet TAKE ONE TABLET BY MOUTH ONE TIME DAILY 90 tablet 0   Probiotic Product (PROBIOTIC PO) Take 1 capsule by mouth daily.     prochlorperazine (COMPAZINE) 10 MG tablet Take 1 tablet (10 mg total) by mouth every 6 (six) hours as needed (Nausea or vomiting). 30 tablet 1   traMADol (ULTRAM) 50 MG tablet Take 1-2 tablets (50-100 mg total) by mouth every 6 (six) hours as needed. 20 tablet 1   UBIQUINOL PO Take by mouth.     VITAMIN E PO Take 23 mg by mouth daily.     ZINC GLUCONATE PO Take 10 mg by mouth daily.     No current facility-administered medications for this encounter.    Physical Findings: The patient is in no acute distress. Patient is alert and oriented.  weight is 202 lb 9.6 oz (91.9 kg). Her temperature is 97.8 F (36.6 C). Her blood pressure is 147/76 (abnormal) and her pulse is 90. Her respiration is 20 and oxygen saturation is 100%. .    Satisfactory skin healing in radiotherapy fields.  She has some mild erythema and lymphedema remaining in the right breast which is not a clinical concern.  There is a persistent seroma in the right axilla which is not a clinical concern, with no evidence of cellulitis or infection.  No excessive warmth in either breast.   Lab Findings: Lab Results  Component Value Date   WBC 5.3 01/10/2021   HGB 12.8 01/10/2021   HCT 37.1 01/10/2021   MCV 89.6 01/10/2021   PLT 156 01/10/2021    Radiographic  Findings: No results found.  Impression/Plan: Healing well from radiotherapy to the breast tissue.  Reassurance given.  Continue skin care with topical Vitamin E Oil and / or lotion for at least 2 more months for further healing.  I encouraged her to continue with yearly mammography as appropriate (for intact breast tissue) and followup with medical oncology. I will see her back on an as-needed basis. I have encouraged her to call if she has any issues or concerns in the future. I wished her the very best.  On date of service, in total, I spent  25 minutes on this encounter. Patient was seen in person.  _____________________________________   Eppie Gibson, MD

## 2021-03-11 NOTE — Therapy (Addendum)
[facility] @ [address] [place], [place], [address] Phone: [phone]   Fax:  [phone]  Physical Therapy Treatment  Patient Details  Name: [name] MRN: [id number] Date of Birth: [birth year] Referring Provider (PT): Reita May Date: 03/11/2021   PT End of Session - 03/11/21 1357     Visit Number 10    Number of Visits 16    Date for PT Re-Evaluation 03/21/21    PT Start Time 0103    PT Stop Time 0148    PT Time Calculation (min) 45 min    Activity Tolerance Patient tolerated treatment well    Behavior During Therapy [facility] for tasks assessed/performed             Past Medical History:  Diagnosis Date   Anal fissure    Atrial septal aneurysm / if pfo  echo 6 13  10/23/2011   Breast cancer ([place])    Cataract    both eyes   Depression    Fatty liver    "pre fatty liver"   Fibromyalgia    Headache(784.0)    hx of migraines when younger   Heart palpitations    hx with normal holter event monitoring   Hx: UTI (urinary tract infection)    Hyperlipidemia    Hypertension    Hypothyroidism    Obesity    Pneumonia 1972   hx of   Polyp of colon    Serrated adenoma of colon 08/2012   Skin cancer    basal, squamous cell   Sleep apnea    uses cpap    Past Surgical History:  Procedure Laterality Date   BREAST LUMPECTOMY WITH RADIOACTIVE SEED AND SENTINEL LYMPH NODE BIOPSY Right 07/26/2020   Procedure: RIGHT BREAST LUMPECTOMY WITH RADIOACTIVE SEED AND SENTINEL LYMPH NODE BIOPSY;  Surgeon: Jovita Kussmaul, MD;  Location: [facility];  Service: General;  Laterality: Right;   BREAST LUMPECTOMY WITH RADIOACTIVE SEED LOCALIZATION Left 07/26/2020   Procedure: LEFT BREAST LUMPECTOMY X 2  WITH RADIOACTIVE SEED LOCALIZATION;  Surgeon: Jovita Kussmaul, MD;  Location: [facility];  Service: General;  Laterality: Left;   COLONOSCOPY     DILATION AND CURETTAGE OF [place]  ARTHROSCOPY     both in past   POLYPECTOMY     [place] Left 07/07/2013   Procedure: LEFT SHOULDER MINI OPEN SUBACROMIAL DECOMPRESSION ROTATOR CUFF REPAIR AND POSSIBLE PATCH GRAFT ;  Surgeon: Johnn Hai, MD;  Location: [facility];  Service: Orthopedics;  Laterality: Left;  with interscaline block   SKIN CANCER EXCISION Bilateral    arm, legs, and chest   TONSILLECTOMY      There were no vitals filed for this visit.   Subjective Assessment - 03/11/21 1255     Subjective The antibiotics didn't do anything and its still real swollen.  I will see the [place] person tomorrow and then on 03/20/2021 see Dr. Marlou Starks.  We need to see if its big enough to be drained. Its still very sore.  I have used liguid Colloidal Silver  topical and the skin feels better and redness is much better.   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 She underwent a left lumpectomy for DCIS and a right lumpectomy and sentinel node biopsy on 07/26/2020. She had 2 lymph nodes removed  with 1 being positive for cancer on the right side. It is ER/PR positive. Left shoulder rotator cuff repair 06/2013. Last radiation on 01/15/21.  Seroma that was drained x 2.    Patient Stated Goals Check out the seroma    Pain Score 3    at worst 5/10   Pain Location Breast    Pain Orientation Right    Pain Descriptors / Indicators Sore;Aching;Burning    Pain Type Acute pain    Pain Radiating Towards breast    Pain Onset More than a month ago    Pain Frequency Constant    Multiple Pain Sites No                                             PT Long Term Goals - 02/21/21 1334       PT LONG TERM GOAL #1   Title Pt will report return to baseline of no resting axillary pain    Time 4    Period Weeks    Status New      PT LONG TERM GOAL #2   Title Pt will be independent in self MLD    Time 4    Period Weeks     Status New                   Plan - 03/11/21 1358     Clinical Impression Statement Decreased redness since pt has applied topical colloidal silver but Seroma still very well defined and seroma and lateral breast are still very tender.  Performed left breast MLD and pt reported feeling slightly more irritated afterwards, but improved when she put her bra on for support.  She has [place] tomorrow and will follow up with MD on 03/20/2021.  She will schedule appts after MD if needed.    Personal Factors and Comorbidities Age;Time since onset of injury/illness/exacerbation;Comorbidity 3+    Comorbidities Bilateral breast CA with Right SLNB, radiation,chemo, FMS,hypothyroidism    Stability/Clinical Decision Making Stable/Uncomplicated    Clinical Decision Making Low    Rehab Potential Good    PT Frequency 2x / week    PT Treatment/Interventions ADLs/Self Care Home Management;Therapeutic exercise;Patient/family education;Neuromuscular re-education;Balance training;Manual techniques;Manual lymph drainage;Passive range of motion;Taping    PT Next Visit Plan MLD to the Rt seroma near the axillary incision to work on softening and pain relief,    PT Home Exercise Plan piriformis stretch, sitting hip  IR and ER with red band, standing hip abd with red, supine clam with ppt and red band, supine scapular series, cone touches, Right breast MLD when tenderness decreases    Consulted and Agree with Plan of Care Patient             Patient will benefit from skilled therapeutic intervention in order to improve the following deficits and impairments:  Decreased knowledge of precautions, Pain, Increased edema  Visit Diagnosis: Ductal carcinoma in situ (DCIS) of left breast  Aftercare following surgery for neoplasm  Localized edema  Abnormal posture     Problem List Patient Active Problem List   Diagnosis Date Noted   Malignant neoplasm of upper-outer quadrant of right breast in female,  estrogen receptor positive ([place]) 06/25/2020   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   Hepatic steatosis 11/27/2014   Hx of adenomatous colonic polyps 11/27/2014   Visit  for preventive health examination 03/28/2014   Hyperlipidemia 03/28/2014   Left rotator cuff tear 07/07/2013   Tear of left rotator cuff 07/07/2013   Rhinitis 06/01/2013   Chest wall deformity 04/12/2012   Atrial septal aneurysm / if pfo  echo 6 13  10/23/2011   OSA (obstructive sleep apnea) 11/27/2010   Dyslipidemia 11/01/2010   Flushing 08/29/2010   Labile hypertension 08/29/2010   MUSCLE CRAMPS, FOOT 06/10/2010   OTHER SLEEP DISTURBANCES 06/10/2010   ANXIETY, SITUATIONAL 05/08/2010   VERTIGO, POSITIONAL 03/15/2010   VITAMIN D DEFICIENCY 02/18/2010   OBESITY 02/18/2010   ALLERGIC RHINITIS 02/18/2010   PLANTAR FASCIITIS 02/18/2010   TWITCHING 06/11/2009   NUMBNESS, HAND 06/11/2009   CERVICAL STRAIN, ACUTE 06/11/2009   OTHER MALAISE AND FATIGUE 07/09/2007   HYPERTENSION 01/28/2007   Hypothyroidism 11/11/2006   COMMON MIGRAINE 11/11/2006   GERD 11/11/2006   FIBROMYALGIA 11/11/2006  PHYSICAL THERAPY DISCHARGE SUMMARY  Visits from Start of Care: 10  Current functional level related to goals / functional outcomes: Unable to assess. Pt did not return   Remaining deficits: Not assessed   Education / Equipment: NA   Patient agrees to discharge. Patient goals were  not fully assessed secondary to not returning. . Patient is being discharged due to not returning since the last visit.  Claris Pong, PT 03/11/2021, 2:01 PM  [facility] @ [address] [place], [place], [address] Phone: [phone]   Fax:  [phone]  Name: REHEMA MUFFLEY MRN: [id number] Date of Birth: 23-May-1955

## 2021-03-12 ENCOUNTER — Ambulatory Visit
Admission: RE | Admit: 2021-03-12 | Discharge: 2021-03-12 | Disposition: A | Discharge status: Home / Self Care | Payer: Medicare HMO | Source: Ambulatory Visit | Attending: Student | Admitting: Student

## 2021-03-12 ENCOUNTER — Other Ambulatory Visit: Payer: Self-pay | Admitting: Student

## 2021-03-12 ENCOUNTER — Encounter: Payer: Self-pay | Admitting: Hematology

## 2021-03-12 DIAGNOSIS — R922 Inconclusive mammogram: Secondary | ICD-10-CM | POA: Diagnosis not present

## 2021-03-12 DIAGNOSIS — Z9889 Other specified postprocedural states: Secondary | ICD-10-CM

## 2021-03-12 DIAGNOSIS — R928 Other abnormal and inconclusive findings on diagnostic imaging of breast: Secondary | ICD-10-CM | POA: Diagnosis not present

## 2021-03-12 DIAGNOSIS — N63 Unspecified lump in unspecified breast: Secondary | ICD-10-CM

## 2021-03-12 DIAGNOSIS — Z853 Personal history of malignant neoplasm of breast: Secondary | ICD-10-CM | POA: Diagnosis not present

## 2021-03-12 DIAGNOSIS — N6489 Other specified disorders of breast: Secondary | ICD-10-CM

## 2021-03-12 NOTE — Progress Notes (Signed)
                                                                                                                                                             Patient Name: [name] MRN: [id number] DOB: 04-Dec-1955 Referring Physician: Shanon Ace (Profile Not Attached) Date of Service: 01/15/2021 [facility], [place]                                                        End Of Treatment Note  Diagnoses: C50.411-Malignant neoplasm of upper-outer quadrant of right female breast And DCIS LEFT BREAST  Cancer Staging: Cancer Staging Ductal carcinoma in situ (DCIS) of left breast Staging form: Breast, AJCC 8th Edition - Clinical stage from 06/21/2020: Stage 0 (cTis (DCIS), cN0, cM0, G2, ER+, PR+, HER2: Not Assessed) - Signed by Truitt Merle, MD on 06/26/2020 Stage prefix: Initial diagnosis  Malignant neoplasm of upper-outer quadrant of right breast in female, estrogen receptor positive ([place]) Staging form: Breast, AJCC 8th Edition - Clinical stage from 06/21/2020: Stage IA (cT1b, cN0, cM0, G2, ER+, PR+, HER2-) - Signed by Truitt Merle, MD on 06/26/2020 Stage prefix: Initial diagnosis - Pathologic stage from 07/26/2020: Stage IA (pT1c, pN1a, cM0, G2, ER+, PR+, HER2-) - Signed by Truitt Merle, MD on 08/09/2020 Stage prefix: Initial diagnosis Nuclear grade: G2 Histologic grading system: 3 grade system Residual tumor (R): R0 - None   Intent: Curative  Radiation Treatment Dates: 12/03/2020 through 01/15/2021 Site Technique Total Dose (Gy) Dose per Fx (Gy) Completed Fx Beam Energies  Breast, Right: Breast_Rt 3D 50/50 2 25/25 10XFFF  Breast, Right: Breast_Rt_PAB_SCV 3D 50/50 2 25/25 6X, 15X  Breast, Right: Breast_Rt_Bst specialPort 10/10 2 5/5 15E  Breast, Left: Breast_Lt 3D 50/50 2 25/25 6XFFF   Narrative: The patient tolerated radiation therapy relatively well.   Plan: The patient will follow-up with radiation oncology in 1-2 mo .  -----------------------------------  Eppie Gibson, MD

## 2021-03-20 DIAGNOSIS — C50411 Malignant neoplasm of upper-outer quadrant of right female breast: Secondary | ICD-10-CM | POA: Diagnosis not present

## 2021-03-20 DIAGNOSIS — D0512 Intraductal carcinoma in situ of left breast: Secondary | ICD-10-CM | POA: Diagnosis not present

## 2021-03-20 DIAGNOSIS — Z170 Estrogen receptor positive status [ER+]: Secondary | ICD-10-CM | POA: Diagnosis not present

## 2021-03-22 DIAGNOSIS — R002 Palpitations: Secondary | ICD-10-CM | POA: Diagnosis not present

## 2021-03-22 DIAGNOSIS — I10 Essential (primary) hypertension: Secondary | ICD-10-CM | POA: Diagnosis not present

## 2021-03-26 ENCOUNTER — Telehealth: Payer: Self-pay

## 2021-03-26 NOTE — Telephone Encounter (Signed)
Last OV 09/17/20 for acute concerns. Last AWV 06/11/20  Pt notified of above; appt scheduled for 04/08/21.

## 2021-03-27 DIAGNOSIS — M9903 Segmental and somatic dysfunction of lumbar region: Secondary | ICD-10-CM | POA: Diagnosis not present

## 2021-03-27 DIAGNOSIS — M9901 Segmental and somatic dysfunction of cervical region: Secondary | ICD-10-CM | POA: Diagnosis not present

## 2021-03-27 DIAGNOSIS — M531 Cervicobrachial syndrome: Secondary | ICD-10-CM | POA: Diagnosis not present

## 2021-03-27 DIAGNOSIS — M9902 Segmental and somatic dysfunction of thoracic region: Secondary | ICD-10-CM | POA: Diagnosis not present

## 2021-03-28 DIAGNOSIS — N281 Cyst of kidney, acquired: Secondary | ICD-10-CM | POA: Diagnosis not present

## 2021-03-28 DIAGNOSIS — N8111 Cystocele, midline: Secondary | ICD-10-CM | POA: Diagnosis not present

## 2021-04-08 ENCOUNTER — Encounter: Payer: Self-pay | Admitting: Internal Medicine

## 2021-04-08 ENCOUNTER — Ambulatory Visit (INDEPENDENT_AMBULATORY_CARE_PROVIDER_SITE_OTHER): Payer: Medicare HMO | Admitting: Internal Medicine

## 2021-04-08 VITALS — BP 150/80 | HR 93 | Temp 98.6°F | Ht 66.0 in | Wt 206.2 lb

## 2021-04-08 DIAGNOSIS — Z0000 Encounter for general adult medical examination without abnormal findings: Secondary | ICD-10-CM

## 2021-04-08 DIAGNOSIS — Z170 Estrogen receptor positive status [ER+]: Secondary | ICD-10-CM

## 2021-04-08 DIAGNOSIS — G4733 Obstructive sleep apnea (adult) (pediatric): Secondary | ICD-10-CM

## 2021-04-08 DIAGNOSIS — Z2821 Immunization not carried out because of patient refusal: Secondary | ICD-10-CM

## 2021-04-08 DIAGNOSIS — R0989 Other specified symptoms and signs involving the circulatory and respiratory systems: Secondary | ICD-10-CM

## 2021-04-08 DIAGNOSIS — Z79899 Other long term (current) drug therapy: Secondary | ICD-10-CM | POA: Diagnosis not present

## 2021-04-08 DIAGNOSIS — R030 Elevated blood-pressure reading, without diagnosis of hypertension: Secondary | ICD-10-CM | POA: Diagnosis not present

## 2021-04-08 DIAGNOSIS — C50411 Malignant neoplasm of upper-outer quadrant of right female breast: Secondary | ICD-10-CM

## 2021-04-08 NOTE — Progress Notes (Signed)
Chief Complaint  Patient presents with   Annual Exam    HPI: Patient  [name]  65 y.o. comes in today for Preventive Health Care visit   Under rx breast cancer    Reurring seroma. Right .  Anxitous  thyroid  getting better   tired and now doing better  without a nap.  Bp went up with  meds    cards added  aromasin.    To have fu.  Urology evaluation and  cyst not felt to be important .  Gets twitch lower lip at times  R foot ankle seen specialist   hard to keep walking and exercise .   Brace advised is expensive   Health Maintenance  Topic Date Due   COVID-19 Vaccine (1) Never done   Pneumonia Vaccine 24+ Years old (1 - PCV) Never done   Zoster Vaccines- Shingrix (1 of 2) Never done   DEXA SCAN  Never done   INFLUENZA VACCINE  12/10/2020   COLONOSCOPY (Pts 45-57yrs Insurance coverage will need to be confirmed)  01/26/2022   MAMMOGRAM  06/08/2022   TETANUS/TDAP  03/28/2024   Hepatitis C Screening  Completed   HPV VACCINES  Aged Out   Health Maintenance Review LIFESTYLE:  Exercise:   trying to walk   ankle an issues  Tobacco/ETS: no Alcohol:  rare  Sugar beverages: Sleep:  cpap about 7-8  Drug use: no HH of  3  1 dog    ROS:  REST of 12 system review negative except as per HPI   Past Medical History:  Diagnosis Date   Anal fissure    Atrial septal aneurysm / if pfo  echo 6 13  10/23/2011   Breast cancer ([facility] [facility])    Cataract    both eyes   Depression    Fatty liver    "pre fatty liver"   Fibromyalgia    Headache(784.0)    hx of migraines when younger   Heart palpitations    hx with normal holter event monitoring   Hx: UTI (urinary tract infection)    Hyperlipidemia    Hypertension    Hypothyroidism    Obesity    Pneumonia 1972   hx of   Polyp of colon    Serrated adenoma of colon 08/2012   Skin cancer    basal, squamous cell   Sleep apnea    uses cpap    Past Surgical History:  Procedure Laterality Date   BREAST LUMPECTOMY WITH  RADIOACTIVE SEED AND SENTINEL LYMPH NODE BIOPSY Right 07/26/2020   Procedure: RIGHT BREAST LUMPECTOMY WITH RADIOACTIVE SEED AND SENTINEL LYMPH NODE BIOPSY;  Surgeon: Jovita Kussmaul, MD;  Location: [facility];  Service: General;  Laterality: Right;   BREAST LUMPECTOMY WITH RADIOACTIVE SEED LOCALIZATION Left 07/26/2020   Procedure: LEFT BREAST LUMPECTOMY X 2  WITH RADIOACTIVE SEED LOCALIZATION;  Surgeon: Jovita Kussmaul, MD;  Location: [facility];  Service: General;  Laterality: Left;   COLONOSCOPY     DILATION AND CURETTAGE OF [place] ARTHROSCOPY     both in past   POLYPECTOMY     SHOULDER OPEN [place] Left 07/07/2013   Procedure: LEFT SHOULDER MINI OPEN SUBACROMIAL DECOMPRESSION ROTATOR CUFF REPAIR AND POSSIBLE PATCH GRAFT ;  Surgeon: Johnn Hai, MD;  Location: [facility];  Service: Orthopedics;  Laterality: Left;  with interscaline block   SKIN CANCER EXCISION Bilateral  arm, legs, and chest   TONSILLECTOMY      Family History  Problem Relation Age of Onset   Hypertension Mother        low borderline   Osteoporosis Mother    Hypertension Father    Liver disease Father        amyloid deceased   Hyperlipidemia Father    Melanoma Sister    Cancer Sister        melanoma    Juvenile Diabetes Daughter    ADD / ADHD Child    Hyperlipidemia Other        Maternal grandmother   Colon cancer Neg Hx    Stomach cancer Neg Hx    Colon polyps Neg Hx    Esophageal cancer Neg Hx    Rectal cancer Neg Hx     Social History   Socioeconomic History   Marital status: Married    Spouse name: Not on file   Number of children: 6   Years of education: Not on file   Highest education level: Not on file  Occupational History   Occupation: TEACHER ASST    Employer: [employer]  Tobacco Use   Smoking status: Never   Smokeless tobacco: Never  Vaping Use   Vaping Use: Never used  Substance and Sexual Activity   Alcohol use: Yes     Comment: rare   Drug use: No   Sexual activity: Not on file  Other Topics Concern   Not on file  Social History Narrative   Teachers Aide [employer]  Not workking since last February . 15 left shoulder surgery    Married   Special educ   HH of 4   3 outside dogs, 8 goats, 40 chickens and quail. [place]   Job stresses   Ex husband passes away   Not working after injur at school  Shoulder neck                Social Determinants of Health   Financial Resource Strain: Low Risk    Difficulty of Paying Living Expenses: Not hard at all  Food Insecurity: No Food Insecurity   Worried About Charity fundraiser in the Last Year: Never true   Arboriculturist in the Last Year: Never true  Transportation Needs: No Transportation Needs   Lack of Transportation (Medical): No   Lack of Transportation (Non-Medical): No  Physical Activity: Inactive   Days of Exercise per Week: 0 days   Minutes of Exercise per Session: 0 min  Stress: No Stress Concern Present   Feeling of Stress : Not at all  Social Connections: Moderately Integrated   Frequency of Communication with Friends and Family: More than three times a week   Frequency of Social Gatherings with Friends and Family: More than three times a week   Attends Religious Services: More than 4 times per year   Active Member of Clubs or Organizations: No   Attends Archivist Meetings: Never   Marital Status: Married    Outpatient Medications Prior to Visit  Medication Sig Dispense Refill   Ascorbic Acid (VITAMIN C) 1000 MG tablet Take 1,000 mg by mouth daily.     b complex vitamins capsule Take 1 capsule by mouth daily.     Bioflavonoid Products (QUERCETIN COMPLEX IMMUNE PO) Take 1 capsule by mouth daily.     Cholecalciferol (VITAMIN D) 50 MCG (2000 UT) tablet Take 4,000 Units  by mouth daily.     exemestane (AROMASIN) 25 MG tablet Take 1 tablet (25 mg total) by mouth daily after breakfast. 30 tablet 1    hydrochlorothiazide (HYDRODIURIL) 25 MG tablet TAKE 1 TABLET BY MOUTH EVERY DAY 90 tablet 0   hydroxychloroquine (PLAQUENIL) 200 MG tablet Take 200 mg by mouth 2 (two) times a week.     MAGNESIUM CARBONATE PO Take 1 tablet by mouth daily.     melatonin 5 MG TABS Take 5 mg by mouth at bedtime.     Menaquinone-7 (VITAMIN K2) 100 MCG CAPS Take 100 mcg by mouth daily.     metoprolol succinate (TOPROL XL) 25 MG 24 hr tablet TAKE ONCE A DAY AS DIRECTED (Patient taking differently: Take by mouth daily as needed (Blood pressure). TAKE ONCE A DAY AS DIRECTED) 30 tablet 1   Multiple Vitamin (MULTIVITAMIN) tablet Take 1 tablet by mouth daily.     NON FORMULARY Take 48.6 mg by mouth See admin instructions. Thyroid desiccated (procine) sr 2 capsules 5 days a week, 3 capsules 2 days per week (Tues and Fri)     OLIVE LEAF EXTRACT PO Take 1 capsule by mouth daily.     OVER THE COUNTER MEDICATION Take 2 capsules by mouth in the morning and at bedtime. Ifnla Med     OVER THE COUNTER MEDICATION Take 1 capsule by mouth daily. Essential Pro     potassium chloride SA (KLOR-CON) 20 MEQ tablet TAKE ONE TABLET BY MOUTH ONE TIME DAILY 90 tablet 0   Probiotic Product (PROBIOTIC PO) Take 1 capsule by mouth daily.     UBIQUINOL PO Take by mouth.     VITAMIN E PO Take 23 mg by mouth daily.     ZINC GLUCONATE PO Take 10 mg by mouth daily.     dexamethasone (DECADRON) 4 MG tablet Take 2 tablets (8 mg total) by mouth 2 (two) times daily. Start the day before Taxotere. Then again the day after chemo for 3 days. 30 tablet 1   doxycycline (VIBRA-TABS) 100 MG tablet Take 1 tablet (100 mg total) by mouth 2 (two) times daily. 14 tablet 0   lidocaine-prilocaine (EMLA) cream Apply to affected area once 30 g 3   LORazepam (ATIVAN) 1 MG tablet Take 0.5-1 tablets (0.5-1 mg total) by mouth every 8 (eight) hours as needed for anxiety. anxiety 20 tablet 0   ondansetron (ZOFRAN) 8 MG tablet Take 1 tablet (8 mg total) by mouth 2 (two) times  daily as needed for refractory nausea / vomiting. Start on day 3 after chemo. 30 tablet 1   prochlorperazine (COMPAZINE) 10 MG tablet Take 1 tablet (10 mg total) by mouth every 6 (six) hours as needed (Nausea or vomiting). 30 tablet 1   traMADol (ULTRAM) 50 MG tablet Take 1-2 tablets (50-100 mg total) by mouth every 6 (six) hours as needed. 20 tablet 1   No facility-administered medications prior to visit.     EXAM:  BP (!) 150/80 (BP Location: Left Arm, Patient Position: Sitting, Cuff Size: Large)   Pulse 93   Temp 98.6 F (37 C) (Oral)   Ht 5\' 6"  (1.676 m)   Wt 206 lb 3.2 oz (93.5 kg)   LMP  (LMP Unknown)   SpO2 98%   BMI 33.28 kg/m   Body mass index is 33.28 kg/m. Wt Readings from Last 3 Encounters:  04/08/21 206 lb 3.2 oz (93.5 kg)  03/06/21 202 lb 9.6 oz (91.9 kg)  02/28/21 205 lb  6.4 oz (93.2 kg)    Physical Exam: Vital signs reviewed UYQ:IHKV is a well-developed well-nourished alert cooperative    who appearsr stated age in no acute distress.  HEENT: normocephalic atraumatic , Eyes: PERRL EOM's full, conjunctiva clear, Nares: NEtenderness., Ears: no deformity EAC's clear TMs with normal landmarks. Mouth:masked  NECK: supple without masses, thyromegaly or bruits. CHEST/PULM:  Clear to auscultation and percussion breath sounds equal no wheeze , rales or rhonchi. No chest wall deformities or tenderness. Breast: swelling  skin tender axilla no mass effect  left no mass . CV: PMI is nondisplaced, S1 S2 no gallops, murmurs, rubs. Peripheral pulses are without delay.No JVD .  ABDOMEN: Bowel sounds normal nontender  No guard or rebound, no hepato splenomegal no CVA tenderness.  No hernia. Extremtities:  No clubbing cyanosis or edema, no acute joint swelling or redness no focal atrophy r ankle  lateral post tibial tendeon tender  NEURO:  Oriented x3, cranial nerves 3-12 appear to be intact, no obvious focal weakness,gait within normal limits no abnormal reflexes or  asymmetrical SKIN: No acute rashes normal turgor, color, no bruising or petechiae. PSYCH: Oriented, good eye contact, no obvious depression anxiety, cognition and judgment appear normal. LN: no cervical axillary inguinal adenopathy  Lab Results  Component Value Date   WBC 5.3 01/10/2021   HGB 12.8 01/10/2021   HCT 37.1 01/10/2021   PLT 156 01/10/2021   GLUCOSE 90 01/10/2021   CHOL 226 (A) 06/05/2020   TRIG 56 08/05/2019   HDL 57 06/05/2020   LDLDIRECT 165.2 12/27/2012   LDLCALC 156 06/05/2020   ALT 47 (H) 01/10/2021   AST 26 01/10/2021   NA 140 01/10/2021   K 3.6 01/10/2021   CL 105 01/10/2021   CREATININE 0.69 01/10/2021   BUN 12 01/10/2021   CO2 26 01/10/2021   TSH 2.33 06/05/2020   HGBA1C 5.2 06/01/2013    BP Readings from Last 3 Encounters:  04/08/21 (!) 150/80  03/06/21 (!) 147/76  02/28/21 (!) 158/84    Labplan reviewed with patient   ASSESSMENT AND PLAN:  Discussed the following assessment and plan:    ICD-10-CM   1. Visit for preventive health examination  Z00.00     2. Malignant neoplasm of upper-outer quadrant of right breast in female, estrogen receptor positive ([place] [place])  C50.411    Z17.0     3. Elevated blood pressure reading  R03.0     4. OSA (obstructive sleep apnea)  G47.33     5. Pneumococcal vaccination declined by patient  Z28.21     6. Influenza vaccination declined by patient  Z28.21     7. Medication management  Z79.899     8. Labile hypertension  R09.89      Dexa to be done  Dr Radene Knee  Declines above vaccines.  FU Bp    get back to [facility]   labs per oncology hem etc team  Return for as indicated every 6- 12 months .  Patient Care Team: Cristina Ceniceros, Standley Brooking, MD as PCP - General Arvella Nigh, MD as Consulting Physician (Obstetrics and Gynecology) Dr Peggye Form, MD as Consulting Physician (Orthopedic Surgery) Ladene Artist, MD as Consulting Physician (Gastroenterology) Melina Schools, MD as Consulting Physician (Orthopedic  Surgery) Rockwell Germany, RN as Oncology Nurse Navigator Mauro Kaufmann, RN as Oncology Nurse Navigator Jovita Kussmaul, MD as Consulting Physician (General Surgery) Truitt Merle, MD as Consulting Physician (Hematology) Eppie Gibson, MD as Attending Physician (Radiation Oncology) Delman Cheadle,  Santiago Glad, MD as Consulting Physician (Dermatology) Serita Kyle, MD as Referring Physician (Cardiology) Patient Instructions  Good to see  you today .  Look into less expensive ankle braces .  Medical supply.        Updated lab work.  At you oncologist .   BP Fu with treatment team  .  Get  back to dash eating and take care of yourself.      Health Maintenance, Female Adopting a healthy lifestyle and getting preventive care are important in promoting health and wellness. Ask your health care provider about: The right schedule for you to have regular tests and exams. Things you can do on your own to prevent diseases and keep yourself healthy. What should I know about diet, weight, and exercise? Eat a healthy diet  Eat a diet that includes plenty of vegetables, fruits, low-fat dairy products, and lean protein. Do not eat a lot of foods that are high in solid fats, added sugars, or sodium. Maintain a healthy weight Body mass index (BMI) is used to identify weight problems. It estimates body fat based on height and weight. Your health care provider can help determine your BMI and help you achieve or maintain a healthy weight. Get regular exercise Get regular exercise. This is one of the most important things you can do for your health. Most adults should: Exercise for at least 150 minutes each week. The exercise should increase your heart rate and make you sweat (moderate-intensity exercise). Do strengthening exercises at least twice a week. This is in addition to the moderate-intensity exercise. Spend less time sitting. Even light physical activity can be beneficial. Watch cholesterol and blood  lipids Have your blood tested for lipids and cholesterol at 67 years of age, then have this test every 5 years. Have your cholesterol levels checked more often if: Your lipid or cholesterol levels are high. You are older than 67 years of age. You are at high risk for heart disease. What should I know about cancer screening? Depending on your health history and family history, you may need to have cancer screening at various ages. This may include screening for: Breast cancer. Cervical cancer. Colorectal cancer. Skin cancer. Lung cancer. What should I know about heart disease, diabetes, and high blood pressure? Blood pressure and heart disease High blood pressure causes heart disease and increases the risk of stroke. This is more likely to develop in people who have high blood pressure readings or are overweight. Have your blood pressure checked: Every 3-5 years if you are 27-49 years of age. Every year if you are 54 years old or older. Diabetes Have regular diabetes screenings. This checks your fasting blood sugar level. Have the screening done: Once every three years after age 82 if you are at a normal weight and have a low risk for diabetes. More often and at a younger age if you are overweight or have a high risk for diabetes. What should I know about preventing infection? Hepatitis B If you have a higher risk for hepatitis B, you should be screened for this virus. Talk with your health care provider to find out if you are at risk for hepatitis B infection. Hepatitis C Testing is recommended for: Everyone born from [birth year] through 1967. Anyone with known risk factors for hepatitis C. Sexually transmitted infections (STIs) Get screened for STIs, including gonorrhea and chlamydia, if: You are sexually active and are younger than 67 years of age. You are older than 67 years  of age and your health care provider tells you that you are at risk for this type of infection. Your sexual  activity has changed since you were last screened, and you are at increased risk for chlamydia or gonorrhea. Ask your health care provider if you are at risk. Ask your health care provider about whether you are at high risk for HIV. Your health care provider may recommend a prescription medicine to help prevent HIV infection. If you choose to take medicine to prevent HIV, you should first get tested for HIV. You should then be tested every 3 months for as long as you are taking the medicine. Pregnancy If you are about to stop having your period (premenopausal) and you may become pregnant, seek counseling before you get pregnant. Take 400 to 800 micrograms (mcg) of folic acid every day if you become pregnant. Ask for birth control (contraception) if you want to prevent pregnancy. Osteoporosis and menopause Osteoporosis is a disease in which the bones lose minerals and strength with aging. This can result in bone fractures. If you are 61 years old or older, or if you are at risk for osteoporosis and fractures, ask your health care provider if you should: Be screened for bone loss. Take a calcium or vitamin D supplement to lower your risk of fractures. Be given hormone replacement therapy (HRT) to treat symptoms of menopause. Follow these instructions at home: Alcohol use Do not drink alcohol if: Your health care provider tells you not to drink. You are pregnant, may be pregnant, or are planning to become pregnant. If you drink alcohol: Limit how much you have to: 0-1 drink a day. Know how much alcohol is in your drink. In the [place] one drink equals one 12 oz bottle of beer (355 mL), one 5 oz glass of wine (148 mL), or one 1 oz glass of hard liquor (44 mL). Lifestyle Do not use any products that contain nicotine or tobacco. These products include cigarettes, chewing tobacco, and vaping devices, such as e-cigarettes. If you need help quitting, ask your health care provider. Do not use street  drugs. Do not share needles. Ask your health care provider for help if you need support or information about quitting drugs. General instructions Schedule regular health, dental, and eye exams. Stay current with your vaccines. Tell your health care provider if: You often feel depressed. You have ever been abused or do not feel safe at home. Summary Adopting a healthy lifestyle and getting preventive care are important in promoting health and wellness. Follow your health care provider's instructions about healthy diet, exercising, and getting tested or screened for diseases. Follow your health care provider's instructions on monitoring your cholesterol and blood pressure. This information is not intended to replace advice given to you by your health care provider. Make sure you discuss any questions you have with your health care provider. Document Revised: 09/17/2020 Document Reviewed: 09/17/2020 Elsevier Patient Education  2022 [place] Cystal Shannahan M.D.

## 2021-04-08 NOTE — Patient Instructions (Addendum)
Good to see  you today .  Look into less expensive ankle braces .  Medical supply.        Updated lab work.  At you oncologist .   BP Fu with treatment team  .  Get  back to dash eating and take care of yourself.      Health Maintenance, Female Adopting a healthy lifestyle and getting preventive care are important in promoting health and wellness. Ask your health care provider about: The right schedule for you to have regular tests and exams. Things you can do on your own to prevent diseases and keep yourself healthy. What should I know about diet, weight, and exercise? Eat a healthy diet  Eat a diet that includes plenty of vegetables, fruits, low-fat dairy products, and lean protein. Do not eat a lot of foods that are high in solid fats, added sugars, or sodium. Maintain a healthy weight Body mass index (BMI) is used to identify weight problems. It estimates body fat based on height and weight. Your health care provider can help determine your BMI and help you achieve or maintain a healthy weight. Get regular exercise Get regular exercise. This is one of the most important things you can do for your health. Most adults should: Exercise for at least 150 minutes each week. The exercise should increase your heart rate and make you sweat (moderate-intensity exercise). Do strengthening exercises at least twice a week. This is in addition to the moderate-intensity exercise. Spend less time sitting. Even light physical activity can be beneficial. Watch cholesterol and blood lipids Have your blood tested for lipids and cholesterol at 67 years of age, then have this test every 5 years. Have your cholesterol levels checked more often if: Your lipid or cholesterol levels are high. You are older than 67 years of age. You are at high risk for heart disease. What should I know about cancer screening? Depending on your health history and family history, you may need to have cancer screening at  various ages. This may include screening for: Breast cancer. Cervical cancer. Colorectal cancer. Skin cancer. Lung cancer. What should I know about heart disease, diabetes, and high blood pressure? Blood pressure and heart disease High blood pressure causes heart disease and increases the risk of stroke. This is more likely to develop in people who have high blood pressure readings or are overweight. Have your blood pressure checked: Every 3-5 years if you are 54-40 years of age. Every year if you are 54 years old or older. Diabetes Have regular diabetes screenings. This checks your fasting blood sugar level. Have the screening done: Once every three years after age 76 if you are at a normal weight and have a low risk for diabetes. More often and at a younger age if you are overweight or have a high risk for diabetes. What should I know about preventing infection? Hepatitis B If you have a higher risk for hepatitis B, you should be screened for this virus. Talk with your health care provider to find out if you are at risk for hepatitis B infection. Hepatitis C Testing is recommended for: Everyone born from [birth year] through 1967. Anyone with known risk factors for hepatitis C. Sexually transmitted infections (STIs) Get screened for STIs, including gonorrhea and chlamydia, if: You are sexually active and are younger than 67 years of age. You are older than 67 years of age and your health care provider tells you that you are at risk for this type  of infection. Your sexual activity has changed since you were last screened, and you are at increased risk for chlamydia or gonorrhea. Ask your health care provider if you are at risk. Ask your health care provider about whether you are at high risk for HIV. Your health care provider may recommend a prescription medicine to help prevent HIV infection. If you choose to take medicine to prevent HIV, you should first get tested for HIV. You should then be  tested every 3 months for as long as you are taking the medicine. Pregnancy If you are about to stop having your period (premenopausal) and you may become pregnant, seek counseling before you get pregnant. Take 400 to 800 micrograms (mcg) of folic acid every day if you become pregnant. Ask for birth control (contraception) if you want to prevent pregnancy. Osteoporosis and menopause Osteoporosis is a disease in which the bones lose minerals and strength with aging. This can result in bone fractures. If you are 59 years old or older, or if you are at risk for osteoporosis and fractures, ask your health care provider if you should: Be screened for bone loss. Take a calcium or vitamin D supplement to lower your risk of fractures. Be given hormone replacement therapy (HRT) to treat symptoms of menopause. Follow these instructions at home: Alcohol use Do not drink alcohol if: Your health care provider tells you not to drink. You are pregnant, may be pregnant, or are planning to become pregnant. If you drink alcohol: Limit how much you have to: 0-1 drink a day. Know how much alcohol is in your drink. In the [place] one drink equals one 12 oz bottle of beer (355 mL), one 5 oz glass of wine (148 mL), or one 1 oz glass of hard liquor (44 mL). Lifestyle Do not use any products that contain nicotine or tobacco. These products include cigarettes, chewing tobacco, and vaping devices, such as e-cigarettes. If you need help quitting, ask your health care provider. Do not use street drugs. Do not share needles. Ask your health care provider for help if you need support or information about quitting drugs. General instructions Schedule regular health, dental, and eye exams. Stay current with your vaccines. Tell your health care provider if: You often feel depressed. You have ever been abused or do not feel safe at home. Summary Adopting a healthy lifestyle and getting preventive care are important in  promoting health and wellness. Follow your health care provider's instructions about healthy diet, exercising, and getting tested or screened for diseases. Follow your health care provider's instructions on monitoring your cholesterol and blood pressure. This information is not intended to replace advice given to you by your health care provider. Make sure you discuss any questions you have with your health care provider. Document Revised: 09/17/2020 Document Reviewed: 09/17/2020 [employer] Patient Education  [place].

## 2021-04-09 ENCOUNTER — Telehealth: Payer: Self-pay | Admitting: *Deleted

## 2021-04-09 ENCOUNTER — Other Ambulatory Visit: Payer: Self-pay | Admitting: Nurse Practitioner

## 2021-04-09 ENCOUNTER — Other Ambulatory Visit: Payer: Self-pay | Admitting: *Deleted

## 2021-04-09 DIAGNOSIS — Z1329 Encounter for screening for other suspected endocrine disorder: Secondary | ICD-10-CM

## 2021-04-10 DIAGNOSIS — M8588 Other specified disorders of bone density and structure, other site: Secondary | ICD-10-CM | POA: Diagnosis not present

## 2021-04-10 DIAGNOSIS — E039 Hypothyroidism, unspecified: Secondary | ICD-10-CM | POA: Diagnosis not present

## 2021-04-10 DIAGNOSIS — N958 Other specified menopausal and perimenopausal disorders: Secondary | ICD-10-CM | POA: Diagnosis not present

## 2021-04-10 DIAGNOSIS — Z8262 Family history of osteoporosis: Secondary | ICD-10-CM | POA: Diagnosis not present

## 2021-04-10 LAB — HM DEXA SCAN

## 2021-04-11 ENCOUNTER — Encounter: Payer: Self-pay | Admitting: Nurse Practitioner

## 2021-04-11 ENCOUNTER — Inpatient Hospital Stay: Payer: Medicare HMO | Attending: Hematology | Admitting: Nurse Practitioner

## 2021-04-11 ENCOUNTER — Other Ambulatory Visit: Payer: Self-pay | Admitting: *Deleted

## 2021-04-11 ENCOUNTER — Inpatient Hospital Stay: Payer: Medicare HMO

## 2021-04-11 ENCOUNTER — Other Ambulatory Visit: Payer: Self-pay

## 2021-04-11 VITALS — BP 160/79 | HR 86 | Temp 97.7°F | Resp 20 | Wt 207.0 lb

## 2021-04-11 DIAGNOSIS — R5383 Other fatigue: Secondary | ICD-10-CM | POA: Diagnosis not present

## 2021-04-11 DIAGNOSIS — Z79811 Long term (current) use of aromatase inhibitors: Secondary | ICD-10-CM | POA: Insufficient documentation

## 2021-04-11 DIAGNOSIS — N8110 Cystocele, unspecified: Secondary | ICD-10-CM | POA: Insufficient documentation

## 2021-04-11 DIAGNOSIS — C50411 Malignant neoplasm of upper-outer quadrant of right female breast: Secondary | ICD-10-CM

## 2021-04-11 DIAGNOSIS — R253 Fasciculation: Secondary | ICD-10-CM | POA: Insufficient documentation

## 2021-04-11 DIAGNOSIS — Z86000 Personal history of in-situ neoplasm of breast: Secondary | ICD-10-CM | POA: Insufficient documentation

## 2021-04-11 DIAGNOSIS — Z923 Personal history of irradiation: Secondary | ICD-10-CM | POA: Insufficient documentation

## 2021-04-11 DIAGNOSIS — M2550 Pain in unspecified joint: Secondary | ICD-10-CM | POA: Insufficient documentation

## 2021-04-11 DIAGNOSIS — I10 Essential (primary) hypertension: Secondary | ICD-10-CM | POA: Diagnosis not present

## 2021-04-11 DIAGNOSIS — Z170 Estrogen receptor positive status [ER+]: Secondary | ICD-10-CM | POA: Insufficient documentation

## 2021-04-11 DIAGNOSIS — Z1329 Encounter for screening for other suspected endocrine disorder: Secondary | ICD-10-CM

## 2021-04-11 DIAGNOSIS — Z79899 Other long term (current) drug therapy: Secondary | ICD-10-CM | POA: Insufficient documentation

## 2021-04-11 DIAGNOSIS — D0512 Intraductal carcinoma in situ of left breast: Secondary | ICD-10-CM | POA: Diagnosis not present

## 2021-04-11 DIAGNOSIS — Z9221 Personal history of antineoplastic chemotherapy: Secondary | ICD-10-CM | POA: Insufficient documentation

## 2021-04-11 LAB — CBC WITH DIFFERENTIAL (CANCER CENTER ONLY)
Abs Immature Granulocytes: 0.02 10*3/uL (ref 0.00–0.07)
Basophils Absolute: 0.1 10*3/uL (ref 0.0–0.1)
Basophils Relative: 1 %
Eosinophils Absolute: 0.1 10*3/uL (ref 0.0–0.5)
Eosinophils Relative: 2 %
HCT: 40.3 % (ref 36.0–46.0)
Hemoglobin: 13.1 g/dL (ref 12.0–15.0)
Immature Granulocytes: 0 %
Lymphocytes Relative: 19 %
Lymphs Abs: 1.5 10*3/uL (ref 0.7–4.0)
MCH: 29.4 pg (ref 26.0–34.0)
MCHC: 32.5 g/dL (ref 30.0–36.0)
MCV: 90.6 fL (ref 80.0–100.0)
Monocytes Absolute: 0.4 10*3/uL (ref 0.1–1.0)
Monocytes Relative: 5 %
Neutro Abs: 5.5 10*3/uL (ref 1.7–7.7)
Neutrophils Relative %: 73 %
Platelet Count: 200 10*3/uL (ref 150–400)
RBC: 4.45 MIL/uL (ref 3.87–5.11)
RDW: 13.8 % (ref 11.5–15.5)
WBC Count: 7.6 10*3/uL (ref 4.0–10.5)
nRBC: 0 % (ref 0.0–0.2)

## 2021-04-11 LAB — CMP (CANCER CENTER ONLY)
ALT: 33 U/L (ref 0–44)
AST: 26 U/L (ref 15–41)
Albumin: 4.2 g/dL (ref 3.5–5.0)
Alkaline Phosphatase: 117 U/L (ref 38–126)
Anion gap: 10 (ref 5–15)
BUN: 18 mg/dL (ref 8–23)
CO2: 28 mmol/L (ref 22–32)
Calcium: 9.6 mg/dL (ref 8.9–10.3)
Chloride: 103 mmol/L (ref 98–111)
Creatinine: 0.78 mg/dL (ref 0.44–1.00)
GFR, Estimated: >60 mL/min (ref >60)
Glucose, Bld: 88 mg/dL (ref 70–99)
Potassium: 3.4 mmol/L — ABNORMAL LOW (ref 3.5–5.1)
Sodium: 141 mmol/L (ref 135–145)
Total Bilirubin: 0.5 mg/dL (ref 0.3–1.2)
Total Protein: 7.6 g/dL (ref 6.5–8.1)

## 2021-04-11 LAB — VITAMIN B12: Vitamin B-12: 2539 pg/mL — ABNORMAL HIGH (ref 180–914)

## 2021-04-11 LAB — IRON AND TIBC
Iron: 90 ug/dL (ref 41–142)
Saturation Ratios: 27 % (ref 21–57)
TIBC: 334 ug/dL (ref 236–444)
UIBC: 243 ug/dL (ref 120–384)

## 2021-04-11 LAB — MAGNESIUM: Magnesium: 2.1 mg/dL (ref 1.7–2.4)

## 2021-04-11 LAB — VITAMIN D 25 HYDROXY (VIT D DEFICIENCY, FRACTURES): Vit D, 25-Hydroxy: 48.14 ng/mL (ref 30–100)

## 2021-04-11 LAB — FERRITIN: Ferritin: 84 ng/mL (ref 11–307)

## 2021-04-11 NOTE — Progress Notes (Signed)
CLINIC:  Survivorship   REASON FOR VISIT:  Routine follow-up post-treatment for a recent history of breast cancer.  BRIEF ONCOLOGIC HISTORY:  Oncology History Overview Note  Cancer Staging Ductal carcinoma in situ (DCIS) of left breast Staging form: Breast, AJCC 8th Edition - Clinical stage from 06/21/2020: Stage 0 (cTis (DCIS), cN0, cM0, G2, ER+, PR+, HER2: Not Assessed) - Signed by Frances Merle, MD on 06/26/2020 Stage prefix: Initial diagnosis  Malignant neoplasm of upper-outer quadrant of right breast in female, estrogen receptor positive ([name]) Staging form: Breast, AJCC 8th Edition - Clinical stage from 06/21/2020: Stage IA (cT1b, cN0, cM0, G2, ER+, PR+, HER2-) - Signed by Frances Merle, MD on 06/26/2020 Stage prefix: Initial diagnosis - Pathologic stage from 07/26/2020: Stage IA (pT1c, pN1a, cM0, G2, ER+, PR+, HER2-) - Signed by Frances Merle, MD on 08/09/2020 Stage prefix: Initial diagnosis Nuclear grade: G2 Histologic grading system: 3 grade system Residual tumor (R): R0 - None    Malignant neoplasm of upper-outer quadrant of right breast in female, estrogen receptor positive ([name])  06/08/2020 Mammogram   IMPRESSION: 1. 9 x 7 x 6 mm mass in the 12 o'clock position of the right breast, 2cmfn with imaging features highly suspicious for malignancy. 2. 4 mm group of indeterminate calcifications in the 12 o'clock position of the left breast and 4 mm group of indeterminate calcifications in the 1 o'clock position of the left breast. Together, the groups span an area measuring 3.9 cm.   06/21/2020 Cancer Staging   Staging form: Breast, AJCC 8th Edition - Clinical stage from 06/21/2020: Stage IA (cT1b, cN0, cM0, G2, ER+, PR+, HER2-) - Signed by Frances Merle, MD on 06/26/2020 Stage prefix: Initial diagnosis    06/21/2020 Initial Biopsy   Diagnosis 1. Breast, right, needle core biopsy, 12 oc - INVASIVE MAMMARY CARCINOMA - MAMMARY CARCINOMA IN SITU - SEE COMMENT 2. Breast, left, needle core  biopsy, 12 oc - MAMMARY CARCINOMA IN-SITU WITH NECROSIS AND CALCIFICATIONS - SEE COMMENT 3. Breast, left, needle core biopsy, 1 oc - MAMMARY CARCINOMA IN-SITU WITH NECROSIS AND CALCIFICATIONS - SEE COMMENT Microscopic Comment 1. The biopsy material shows an infiltrative proliferation of cells with large vesicular nuclei with inconspicuous nucleoli, arranged linearly and in small clusters. Based on the biopsy, the carcinoma appears Nottingham grade 2 of 3.  Addendum: 1. E-cadherin is POSITIVE supporting a ductal origin. 2. E-cadherin is POSITIVE supporting a ductal origin.  3. E-cadherin is positive supporting a ductal origin. The focus is less pronounced on the deeper sections and in isolation would likely be considered atypical ductal hyperplasia.   06/21/2020 Receptors her2   1. PROGNOSTIC INDICATORS Results: IMMUNOHISTOCHEMICAL AND MORPHOMETRIC ANALYSIS PERFORMED MANUALLY The tumor cells are EQUIVOCAL for Her2 (2+). HER2 by FISH will be performed and the results reported separately Estrogen Receptor: 95%, POSITIVE, STRONG STAINING INTENSITY Progesterone Receptor: 40%, POSITIVE, MODERATE STAINING INTENSITY Proliferation Marker Ki67: 10%  1. FLUORESCENCE IN-SITU HYBRIDIZATION Results: GROUP 5: HER2 **NEGATIVE** Equivocal form of amplification of the HER2 gene was detected in the IHC 2+ tissue sample received from this individual. HER2 FISH was performed by a technologist and cell imaging and analysis on the [name].   06/25/2020 Initial Diagnosis   Malignant neoplasm of upper-outer quadrant of right breast in female, estrogen receptor positive ([name])   07/03/2020 Breast MRI   IMPRESSION: 1. Known RIGHT breast cancer, 12 o'clock axis, at anterior depth, measuring 1 cm greatest extent, manifesting as a spiculated enhancing mass on MRI, with associated biopsy clip. Expected post  biopsy changes are seen within the adjacent outer RIGHT breast. 2. No evidence of additional multifocal or  multicentric disease within the RIGHT breast. 3. Known LEFT breast DCIS within the slightly outer LEFT breast, at anterior depth, corresponding to the biopsy site labeled 1 o'clock axis, with associated enhancement only at the margins of the biopsy cavity measuring up to 5 mm greatest dimension. 4. Known LEFT breast DCIS within the upper central LEFT breast, at middle depth, corresponding to the biopsy site labeled 12 o'clock axis. Contiguous linear non-mass enhancement extends 2.3 cm superior-medial to the biopsy cavity, most likely post biopsy change but possibly contiguous extent of disease. 5. No evidence of additional multifocal or multicentric disease within the LEFT breast. 6. No evidence of metastatic lymphadenopathy.     07/26/2020 Cancer Staging   Staging form: Breast, AJCC 8th Edition - Pathologic stage from 07/26/2020: Stage IA (pT1c, pN1a, cM0, G2, ER+, PR+, HER2-) - Signed by Frances Merle, MD on 08/09/2020 Stage prefix: Initial diagnosis Nuclear grade: G2 Histologic grading system: 3 grade system Residual tumor (R): R0 - None    07/26/2020 Surgery   RIGHT BREAST LUMPECTOMY WITH RADIOACTIVE SEED AND SENTINEL LYMPH NODE BIOPSY by Dr [name]   07/26/2020 Pathology Results   FINAL MICROSCOPIC DIAGNOSIS:   A. LYMPH NODE, RIGHT AXILLARY #1, SENTINEL, EXCISION:  - Lymph node, negative for carcinoma (0/1)   B. LYMPH NODE, RIGHT AXILLARY, SENTINEL, EXCISION:  - Benign fibroadipose tissue, negative for carcinoma   C. BREAST, RIGHT, LUMPECTOMY:  - Invasive ductal carcinoma, 1.5 cm, grade 2  - Ductal carcinoma in situ, low grade  - Resection margins are negative for carcinoma; closest is the anterior margin of 0.2 cm  - Biopsy site changes  - See oncology table   D. BREAST, LEFT, LUMPECTOMY:  - Benign breast parenchyma with prominent biopsy-related changes  - Negative for residual ductal carcinoma in situ  - See oncology table   E. LYMPH NODE, RIGHT AXILLARY #2, SENTINEL,  EXCISION:  - Invasive ductal carcinoma, see comment  COMMENT:  E.  Lymph node tissue is not identified.  Findings likely represent an entirely replaced lymph node with foci of extranodal extension.    07/26/2020 Miscellaneous   Mammaprint High Risk of Luminla Type B 29% risk of recurrence in 10 years if untreated.  Her Mammaprint index is -0.175 She has 94.6% benefit of chemotherapy and hormaonal therapy.      08/20/2020 Imaging   CT C/A/P IMPRESSION: 1. No definitive imaging findings to suggest metastatic disease in the chest, abdomen or pelvis. 2. Postoperative changes of bilateral lumpectomy and right axillary lymph node dissection with what appears to be a large postoperative seroma in the right axilla, as detailed above. Attention on follow-up studies is recommended to ensure the stability or regression of this collection. 3. Additional incidental findings, as above.   08/28/2020 Imaging   Bone Scan IMPRESSION: No definite scintigraphic evidence of osseous metastases.   08/30/2020 - 11/02/2020 Chemotherapy   Adjuvant Docetaxel and Cytoxan q3weeks for 4 cycles starting 08/30/20. Given skin rash, changes taxol to Abraxane starting with C2 (09/21/20). Completed 11/02/20.    12/03/2020 - 01/15/2021 Radiation Therapy   Bilateral breast radiation and right regional lymph nodes   02/2021 -  Anti-estrogen oral therapy   Adjuvant exemestane   04/11/2021 Survivorship   SCP delivered by Cira Rue, NP   Ductal carcinoma in situ (DCIS) of left breast  06/08/2020 Mammogram   IMPRESSION: 1. 9 x 7 x 6 mm mass  in the 12 o'clock position of the right breast, 2cmfn with imaging features highly suspicious for malignancy. 2. 4 mm group of indeterminate calcifications in the 12 o'clock position of the left breast and 4 mm group of indeterminate calcifications in the 1 o'clock position of the left breast. Together, the groups span an area measuring 3.9 cm.   06/21/2020 Cancer Staging    Staging form: Breast, AJCC 8th Edition - Clinical stage from 06/21/2020: Stage 0 (cTis (DCIS), cN0, cM0, G2, ER+, PR+, HER2: Not Assessed) - Signed by Frances Merle, MD on 06/26/2020 Stage prefix: Initial diagnosis    06/21/2020 Initial Biopsy   Diagnosis 1. Breast, right, needle core biopsy, 12 oc - INVASIVE MAMMARY CARCINOMA - MAMMARY CARCINOMA IN SITU - SEE COMMENT 2. Breast, left, needle core biopsy, 12 oc - MAMMARY CARCINOMA IN-SITU WITH NECROSIS AND CALCIFICATIONS - SEE COMMENT 3. Breast, left, needle core biopsy, 1 oc - MAMMARY CARCINOMA IN-SITU WITH NECROSIS AND CALCIFICATIONS - SEE COMMENT Microscopic Comment 1. The biopsy material shows an infiltrative proliferation of cells with large vesicular nuclei with inconspicuous nucleoli, arranged linearly and in small clusters. Based on the biopsy, the carcinoma appears Nottingham grade 2 of 3.  Addendum: 1. E-cadherin is POSITIVE supporting a ductal origin. 2. E-cadherin is POSITIVE supporting a ductal origin.  3. E-cadherin is positive supporting a ductal origin. The focus is less pronounced on the deeper sections and in isolation would likely be considered atypical ductal hyperplasia.   06/21/2020 Receptors her2   2. PROGNOSTIC INDICATORS Results: IMMUNOHISTOCHEMICAL AND MORPHOMETRIC ANALYSIS PERFORMED MANUALLY Estrogen Receptor: 95%, POSITIVE, STRONG STAINING INTENSITY Progesterone Receptor: 30%, POSITIVE, STRONG STAINING INTENSITY   06/25/2020 Initial Diagnosis   Ductal carcinoma in situ (DCIS) of left breast   07/03/2020 Breast MRI   IMPRESSION: 1. Known RIGHT breast cancer, 12 o'clock axis, at anterior depth, measuring 1 cm greatest extent, manifesting as a spiculated enhancing mass on MRI, with associated biopsy clip. Expected post biopsy changes are seen within the adjacent outer RIGHT breast. 2. No evidence of additional multifocal or multicentric disease within the RIGHT breast. 3. Known LEFT breast DCIS within the  slightly outer LEFT breast, at anterior depth, corresponding to the biopsy site labeled 1 o'clock axis, with associated enhancement only at the margins of the biopsy cavity measuring up to 5 mm greatest dimension. 4. Known LEFT breast DCIS within the upper central LEFT breast, at middle depth, corresponding to the biopsy site labeled 12 o'clock axis. Contiguous linear non-mass enhancement extends 2.3 cm superior-medial to the biopsy cavity, most likely post biopsy change but possibly contiguous extent of disease. 5. No evidence of additional multifocal or multicentric disease within the LEFT breast. 6. No evidence of metastatic lymphadenopathy.     07/26/2020 Surgery   LEFT BREAST LUMPECTOMY X 2  WITH RADIOACTIVE SEED LOCALIZATION by Dr [name]   07/26/2020 Pathology Results   FINAL MICROSCOPIC DIAGNOSIS:   A. LYMPH NODE, RIGHT AXILLARY #1, SENTINEL, EXCISION:  - Lymph node, negative for carcinoma (0/1)   B. LYMPH NODE, RIGHT AXILLARY, SENTINEL, EXCISION:  - Benign fibroadipose tissue, negative for carcinoma   C. BREAST, RIGHT, LUMPECTOMY:  - Invasive ductal carcinoma, 1.5 cm, grade 2  - Ductal carcinoma in situ, low grade  - Resection margins are negative for carcinoma; closest is the anterior margin of 0.2 cm  - Biopsy site changes  - See oncology table   D. BREAST, LEFT, LUMPECTOMY:  - Benign breast parenchyma with prominent biopsy-related changes  - Negative for residual  ductal carcinoma in situ  - See oncology table   E. LYMPH NODE, RIGHT AXILLARY #2, SENTINEL, EXCISION:  - Invasive ductal carcinoma, see comment  COMMENT:  E.  Lymph node tissue is not identified.  Findings likely represent an entirely replaced lymph node with foci of extranodal extension.    12/03/2020 - 01/15/2021 Radiation Therapy   Bilateral breast radiation and right regional lymph nodes   02/2021 -  Anti-estrogen oral therapy   Adjuvant exemestane   04/11/2021 Survivorship   SCP delivered by Cira Rue, NP     INTERVAL HISTORY:  [name] presents to the [facility] today for our initial meeting to review her survivorship care plan detailing her treatment course for breast cancer, as well as monitoring long-term side effects of that treatment, education regarding health maintenance, screening, and overall wellness and health promotion.     Overall [name] is doing well but still feels very tired with generalized body aches but not as bad as fibromyalgia pain.  She feels like being on antiestrogen has made her more anxious.  She also noticed lower lip twitching, she goes to a chiropractor for previous neck pain.  Her blood pressure has increased since being on exemestane, she started amlodipine and keeps a close journal of her BP readings which tend to fluctuate.  She is worrying about the potential damage of prolonged elevated blood pressure might be doing to her body.  Right breast feels full, both are "achy".  The right axillary seroma was drained.  She notes when she lays on her right side her right arm goes numb.  Denies new lump/mass, nipple discharge or inversion, or breast skin change.  Denies hot flashes.  Wears a pessary for bladder prolapse.  She continues through the emotional ups and downs of breast cancer treatment and survivorship.  ONCOLOGY TREATMENT TEAM:  1. Surgeon:  Dr. Marlou Manning at [facility] 2. Medical Oncologist: Dr. Burr Medico 3. Radiation Oncologist: Dr. Isidore Moos    PAST MEDICAL/SURGICAL HISTORY:  Past Medical History:  Diagnosis Date   Anal fissure    Atrial septal aneurysm / if pfo  echo 6 13  10/23/2011   Breast cancer ([place])    Cataract    both eyes   Depression    Fatty liver    "pre fatty liver"   Fibromyalgia    Headache(784.0)    hx of migraines when younger   Heart palpitations    hx with normal holter event monitoring   Hx: UTI (urinary tract infection)    Hyperlipidemia    Hypertension    Hypothyroidism    Obesity     Pneumonia 1972   hx of   Polyp of colon    Serrated adenoma of colon 08/2012   Skin cancer    basal, squamous cell   Sleep apnea    uses cpap   Past Surgical History:  Procedure Laterality Date   BREAST LUMPECTOMY WITH RADIOACTIVE SEED AND SENTINEL LYMPH NODE BIOPSY Right 07/26/2020   Procedure: RIGHT BREAST LUMPECTOMY WITH RADIOACTIVE SEED AND SENTINEL LYMPH NODE BIOPSY;  Surgeon: Jovita Kussmaul, MD;  Location: [place];  Service: General;  Laterality: Right;   BREAST LUMPECTOMY WITH RADIOACTIVE SEED LOCALIZATION Left 07/26/2020   Procedure: LEFT BREAST LUMPECTOMY X 2  WITH RADIOACTIVE SEED LOCALIZATION;  Surgeon: Jovita Kussmaul, MD;  Location: [place];  Service: General;  Laterality: Left;   COLONOSCOPY     DILATION AND CURETTAGE OF [place]  PREGNANCY SURGERY  1983   KNEE ARTHROSCOPY     both in past   POLYPECTOMY     SHOULDER OPEN ROTATOR CUFF REPAIR Left 07/07/2013   Procedure: LEFT SHOULDER MINI OPEN SUBACROMIAL DECOMPRESSION ROTATOR CUFF REPAIR AND POSSIBLE PATCH GRAFT ;  Surgeon: Johnn Hai, MD;  Location: [facility];  Service: Orthopedics;  Laterality: Left;  with interscaline block   SKIN CANCER EXCISION Bilateral    arm, legs, and chest   TONSILLECTOMY       ALLERGIES:  Allergies  Allergen Reactions   Penicillins Itching, Other (See Comments) and Swelling    REACTION: swelling up as a child REACTION: swelling up as a child   Taxotere [Docetaxel] Anaphylaxis    08/30/20 Constricted throat sensation of throat and tongue swelling and chest pressure Docetaxel paused and given Pepcid 20 mg IV, Benadryl 25 mg IV, Ativan 0.5 mg IV x1.  Ativan 0.5 mg IV x1 was repeated after the restart of the patient's chemotherapy. The patient's symptoms abated and docetaxel restarted.  Then had a recurrence of constriction of throat and was given Ativan 0.5 mg IV x1 for total dose of 1 mg of Ativan.  She was able to complete her treatment with out any further issues or concerns.    Cefdinir Swelling, Itching and Rash    REACTION: Rash, swelling and itching   Norvasc [Amlodipine Besylate] Other (See Comments)    Fatigue and dizziness   Cetirizine Other (See Comments)    unsure   Irbesartan Other (See Comments)    See 1 20 note  Heart burn , dizziness when taken with diuretic  See 1 20 note  Heart burn , dizziness when taken with diuretic    Molds & Smuts     Mold and mildew   Pegfilgrastim     Bad reaction- unspecified    Tylenol [Acetaminophen]     Pre fatty liver: does not takes these     CURRENT MEDICATIONS:  Outpatient Encounter Medications as of 04/11/2021  Medication Sig   Ascorbic Acid (VITAMIN C) 1000 MG tablet Take 1,000 mg by mouth daily.   b complex vitamins capsule Take 1 capsule by mouth daily.   Bioflavonoid Products (QUERCETIN COMPLEX IMMUNE PO) Take 1 capsule by mouth daily.   Cholecalciferol (VITAMIN D) 50 MCG (2000 UT) tablet Take 4,000 Units by mouth daily.   exemestane (AROMASIN) 25 MG tablet Take 1 tablet (25 mg total) by mouth daily after breakfast.   hydrochlorothiazide (HYDRODIURIL) 25 MG tablet TAKE 1 TABLET BY MOUTH EVERY DAY   hydroxychloroquine (PLAQUENIL) 200 MG tablet Take 200 mg by mouth 2 (two) times a week.   MAGNESIUM CARBONATE PO Take 1 tablet by mouth daily.   melatonin 5 MG TABS Take 5 mg by mouth at bedtime.   Menaquinone-7 (VITAMIN K2) 100 MCG CAPS Take 100 mcg by mouth daily.   metoprolol succinate (TOPROL XL) 25 MG 24 hr tablet TAKE ONCE A DAY AS DIRECTED (Patient taking differently: Take by mouth daily as needed (Blood pressure). TAKE ONCE A DAY AS DIRECTED)   Multiple Vitamin (MULTIVITAMIN) tablet Take 1 tablet by mouth daily.   NON FORMULARY Take 48.6 mg by mouth See admin instructions. Thyroid desiccated (procine) sr 2 capsules 5 days a week, 3 capsules 2 days per week (Tues and Fri)   OLIVE LEAF EXTRACT PO Take 1 capsule by mouth daily.   OVER THE COUNTER MEDICATION Take 2 capsules by mouth in the morning and at  bedtime. [facility] Stakes  Med   OVER THE COUNTER MEDICATION Take 1 capsule by mouth daily. Essential Pro   potassium chloride SA (KLOR-CON) 20 MEQ tablet TAKE ONE TABLET BY MOUTH ONE TIME DAILY   Probiotic Product (PROBIOTIC PO) Take 1 capsule by mouth daily.   UBIQUINOL PO Take by mouth.   VITAMIN E PO Take 23 mg by mouth daily.   ZINC GLUCONATE PO Take 10 mg by mouth daily.   No facility-administered encounter medications on file as of 04/11/2021.     ONCOLOGIC FAMILY HISTORY:  Family History  Problem Relation Age of Onset   Hypertension Mother        low borderline   Osteoporosis Mother    Hypertension Father    Liver disease Father        amyloid deceased   Hyperlipidemia Father    Melanoma Sister    Cancer Sister        melanoma    Juvenile Diabetes Daughter    ADD / ADHD Child    Hyperlipidemia Other        Maternal grandmother   Colon cancer Neg Hx    Stomach cancer Neg Hx    Colon polyps Neg Hx    Esophageal cancer Neg Hx    Rectal cancer Neg Hx      GENETIC COUNSELING/TESTING: Now  SOCIAL HISTORY:  [name] is married and lives with her spouse.  She has 6 children.  She denies any current or history of tobacco, alcohol, or illicit drug use.     PHYSICAL EXAMINATION:  Vital Signs:   Vitals:   04/11/21 1220  BP: (!) 160/79  Pulse: 86  Resp: 20  Temp: 97.7 F (36.5 C)  SpO2: 100%   Filed Weights   04/11/21 1220  Weight: 207 lb (93.9 kg)   General: Well-nourished, well-appearing female in no acute distress.   HEENT: Sclerae anicteric.  Lymph: No cervical, supraclavicular, or infraclavicular lymphadenopathy noted on palpation.  Respiratory: breathing non-labored.  GI: Abdomen soft and round; non-tender, non-distended. Bowel sounds normoactive.  Neuro: No focal deficits. Steady gait.  Psych: Mood and affect normal and appropriate for situation.  Extremities: No edema. MSK: No focal spinal tenderness to palpation.  Full range of motion in bilateral  upper extremities Skin: Warm and dry. Breast exam: No bilateral nipple discharge or inversion.  S/p bilateral lumpectomy, incisions completely healed.  Right breast slightly larger with mild erythema and lymphedema.  Axillary seroma has been drained.  No palpable mass in either breast or axilla that I could appreciate.  LABORATORY DATA:  None for this visit.  DIAGNOSTIC IMAGING:  None for this visit.      ASSESSMENT AND PLAN:  Ms.. Manning is a pleasant 65 y.o. 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  She presents to the Survivorship Clinic for our initial meeting and routine follow-up post-completion of treatment for breast cancer.    1.  Bilateral (right IDC, left DCIS) breast cancer:  [name] is continuing to recover from definitive treatment for breast cancer.  Breast exam is benign, no clinical concern for recurrence.  She will follow-up with her medical oncologist, Dr. Burr Medico in 07/2021 with history and physical exam per surveillance protocol.  She will continue her anti-estrogen therapy with exemestane for now.  She is tolerating moderately well with fatigue, body/joint pain,  lip twitching, and elevated BP.  We discussed holding exemestane for 1-2 weeks to monitor BP, she prefers to continue it for now due to fear of cancer recurrence.  She will let me know if she decides otherwise. She was instructed to make Dr. Burr Medico or myself aware if she begins to experience any worsening side effects of the medication and I could see her back in clinic to help manage those side effects, as needed.  Today, a comprehensive survivorship care plan and treatment summary was reviewed with the patient today detailing her breast cancer diagnosis, treatment course, potential  late/long-term effects of treatment, appropriate follow-up care with recommendations for the future, and patient education resources.  A copy of this summary, along with a letter will be sent to the patient's primary care provider via In Basket message after today's visit.    2.  Fatigue: Likely multifactorial, primarily from cancer treatment including surgery, chemo, radiation, and antiestrogen therapy.  We discussed expectations for recovery.  We will rule out other causes including CBC/CMP abnormality, nutritional deficiencies and thyroid dysfunction.  3. Bone health:  Given [name]'s age/history of breast cancer and her current treatment regimen including anti-estrogen therapy with exemestane, she is at risk for bone demineralization.  Her last DEXA scan was yesterday, she is not aware of the results.  I will request a copy.  In the meantime, she was encouraged to increase her consumption of foods rich in calcium, as well as increase her weight-bearing activities.  She was given education on specific activities to promote bone health.  Vitamin D level is pending from today.  4. Cancer screening:  Due to [name] history and her age, she should receive screening for skin cancers, colon cancer, and gynecologic cancers.  The information and recommendations are listed on the patient's comprehensive care plan/treatment summary and were reviewed in detail with the patient.    5. Health maintenance and wellness promotion: [name] was encouraged to consume 5-7 servings of fruits and vegetables per day. We reviewed the "Nutrition Rainbow" handout. She was also encouraged to engage in moderate to vigorous exercise for 30 minutes per day most days of the week. She was instructed to limit her alcohol consumption and continue to abstain from tobacco use.   6. Support services/counseling: It is not uncommon for this period of the patient's cancer care trajectory to be one of many emotions and  stressors.  We discussed an opportunity for her to participate in the next session of [facility] ("Finding Your New Normal") support group series designed for patients after they have completed treatment.  She has requested information on this.  [name] was encouraged to take advantage of our many other support services programs, support groups, and/or counseling in coping with her new life as a cancer survivor after completing anti-cancer treatment.  She was offered support today through active listening and expressive supportive counseling.  She was given information regarding our available services and encouraged to contact me with any questions or for help enrolling in any of our support group/programs.    Dispo:   -Lab today, I will call with results  -return to cancer center 07/2021 -Mammogram due in early February 2023 -Follow up with surgery as indicated -She is welcome to return back to the [facility]orship [facility] at any time; no additional follow-up needed at this time.  -Consider referral back to survivorship as a long-term survivor for continued surveillance  Orders Placed This Encounter  Procedures   MM DIAG  BREAST TOMO BILATERAL    Standing Status:   Future    Standing Expiration Date:   04/11/2022    Order Specific Question:   Reason for Exam (SYMPTOM  OR DIAGNOSIS REQUIRED)    Answer:   R breast cancer and L DCIS 2022    Order Specific Question:   Preferred imaging location?    Answer:   [facility]   Vitamin D 25 hydroxy    Standing Status:   Standing    Number of Occurrences:   1    Standing Expiration Date:   04/11/2022   Magnesium    Standing Status:   Standing    Number of Occurrences:   1    Standing Expiration Date:   04/11/2022   Vitamin B12    Standing Status:   Standing    Number of Occurrences:   1    Standing Expiration Date:   04/11/2022   Ferritin    Standing Status:   Standing    Number of Occurrences:   1    Standing Expiration Date:   04/11/2022   Iron  and TIBC    Standing Status:   Standing    Number of Occurrences:   1    Standing Expiration Date:   04/11/2022     A total of (40) minutes of face-to-face time was spent with this patient with greater than 50% of that time in counseling and care-coordination.   Cira Rue, NP Survivorship Program [facility] [phone]   Note: PRIMARY CARE PROVIDER Burnis Medin, [place] [phone]

## 2021-04-12 ENCOUNTER — Encounter: Payer: Self-pay | Admitting: Internal Medicine

## 2021-04-12 LAB — THYROID PANEL WITH TSH
Free Thyroxine Index: 2.1 (ref 1.2–4.9)
T3 Uptake Ratio: 25 % (ref 24–39)
T4, Total: 8.2 ug/dL (ref 4.5–12.0)
TSH: 0.674 u[IU]/mL (ref 0.450–4.500)

## 2021-04-15 DIAGNOSIS — M531 Cervicobrachial syndrome: Secondary | ICD-10-CM | POA: Diagnosis not present

## 2021-04-15 DIAGNOSIS — M9903 Segmental and somatic dysfunction of lumbar region: Secondary | ICD-10-CM | POA: Diagnosis not present

## 2021-04-15 DIAGNOSIS — M9902 Segmental and somatic dysfunction of thoracic region: Secondary | ICD-10-CM | POA: Diagnosis not present

## 2021-04-15 DIAGNOSIS — M9901 Segmental and somatic dysfunction of cervical region: Secondary | ICD-10-CM | POA: Diagnosis not present

## 2021-04-17 ENCOUNTER — Encounter: Payer: Self-pay | Admitting: Nurse Practitioner

## 2021-04-17 ENCOUNTER — Other Ambulatory Visit: Payer: Self-pay | Admitting: Internal Medicine

## 2021-04-22 ENCOUNTER — Ambulatory Visit: Payer: Medicare HMO | Attending: General Surgery

## 2021-04-22 ENCOUNTER — Other Ambulatory Visit: Payer: Self-pay

## 2021-04-22 VITALS — Wt 207.2 lb

## 2021-04-22 DIAGNOSIS — M25611 Stiffness of right shoulder, not elsewhere classified: Secondary | ICD-10-CM | POA: Insufficient documentation

## 2021-04-22 DIAGNOSIS — R293 Abnormal posture: Secondary | ICD-10-CM | POA: Insufficient documentation

## 2021-04-22 DIAGNOSIS — Z483 Aftercare following surgery for neoplasm: Secondary | ICD-10-CM | POA: Insufficient documentation

## 2021-04-22 DIAGNOSIS — R600 Localized edema: Secondary | ICD-10-CM | POA: Insufficient documentation

## 2021-04-22 DIAGNOSIS — M6281 Muscle weakness (generalized): Secondary | ICD-10-CM | POA: Insufficient documentation

## 2021-04-22 NOTE — Therapy (Signed)
[facility] @ [place], [place], [address] Phone: [phone]   Fax:  [phone]  Physical Therapy Treatment  Patient Details  Name: [name] MRN: [id number] Date of Birth: 12-03-1955 Referring Provider (PT): Isidore Moos   Encounter Date: 04/22/2021   PT End of Session - 04/22/21 1003     Visit Number 10   # unchanged due to screen only   PT Start Time 0959    PT Stop Time 1012    PT Time Calculation (min) 13 min    Activity Tolerance Patient tolerated treatment well    Behavior During Therapy [facility] for tasks assessed/performed             Past Medical History:  Diagnosis Date   Anal fissure    Atrial septal aneurysm / if pfo  echo 6 13  10/23/2011   Breast cancer ([place])    Cataract    both eyes   Depression    Fatty liver    "pre fatty liver"   Fibromyalgia    Headache(784.0)    hx of migraines when younger   Heart palpitations    hx with normal holter event monitoring   Hx: UTI (urinary tract infection)    Hyperlipidemia    Hypertension    Hypothyroidism    Obesity    Pneumonia 1972   hx of   Polyp of colon    Serrated adenoma of colon 08/2012   Skin cancer    basal, squamous cell   Sleep apnea    uses cpap    Past Surgical History:  Procedure Laterality Date   BREAST LUMPECTOMY WITH RADIOACTIVE SEED AND SENTINEL LYMPH NODE BIOPSY Right 07/26/2020   Procedure: RIGHT BREAST LUMPECTOMY WITH RADIOACTIVE SEED AND SENTINEL LYMPH NODE BIOPSY;  Surgeon: Jovita Kussmaul, MD;  Location: [facility];  Service: General;  Laterality: Right;   BREAST LUMPECTOMY WITH RADIOACTIVE SEED LOCALIZATION Left 07/26/2020   Procedure: LEFT BREAST LUMPECTOMY X 2  WITH RADIOACTIVE SEED LOCALIZATION;  Surgeon: Jovita Kussmaul, MD;  Location: [facility];  Service: General;  Laterality: Left;   COLONOSCOPY     DILATION AND CURETTAGE OF [place] ARTHROSCOPY     both in past    POLYPECTOMY     [place] Left 07/07/2013   Procedure: LEFT SHOULDER MINI OPEN SUBACROMIAL DECOMPRESSION ROTATOR CUFF REPAIR AND POSSIBLE PATCH GRAFT ;  Surgeon: Johnn Hai, MD;  Location[facility];  Service: Orthopedics;  Laterality: Left;  with interscaline block   SKIN CANCER EXCISION Bilateral    arm, legs, and chest   TONSILLECTOMY      Vitals:   04/22/21 1003  Weight: 207 lb 4 oz (94 kg)     Subjective Assessment - 04/22/21 1001     Subjective Pt returns for her 3 month L-Dex screen.    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 She underwent a left lumpectomy for DCIS and a right lumpectomy and sentinel node biopsy on 07/26/2020. She had 2 lymph nodes removed with 1 being positive for cancer on the right side. It is ER/PR positive. Left shoulder rotator cuff repair 06/2013. Last radiation on 01/15/21.  Seroma that was drained x 2.                    L-DEX FLOWSHEETS -  04/22/21 1000       L-DEX LYMPHEDEMA SCREENING   Measurement Type Unilateral    L-DEX MEASUREMENT EXTREMITY Upper Extremity    POSITION  Standing    DOMINANT SIDE Right    At Risk Side Right    BASELINE SCORE (UNILATERAL) 3.5    L-DEX SCORE (UNILATERAL) 7.1    VALUE CHANGE (UNILAT) 3.6                                     PT Long Term Goals - 02/21/21 1334       PT LONG TERM GOAL #1   Title Pt will report return to baseline of no resting axillary pain    Time 4    Period Weeks    Status New      PT LONG TERM GOAL #2   Title Pt will be independent in self MLD    Time 4    Period Weeks    Status New                   Plan - 04/22/21 1012     Clinical Impression Statement Pt returns for her 35mo nth L-Dex screen. Her change from baseline of 3.6 is WNLs however she reports increased Rt axillary tightness and would like to resume physical therapy. Request  for referral sent to [facility] and had pt scheudle eval today.    PT Next Visit Plan Re-eval new tingling into 4th and 5th digits and Rt axillary tightness. Cont every 3 month L-Dex screens for up to 2 years from her SLNB (~07/27/22)    Consulted and Agree with Plan of Care Patient             Patient will benefit from skilled therapeutic intervention in order to improve the following deficits and impairments:     Visit Diagnosis: Aftercare following surgery for neoplasm     Problem List Patient Active Problem List   Diagnosis Date Noted   Malignant neoplasm of upper-outer quadrant of right breast in female, estrogen receptor positive ([place]) 06/25/2020   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   Hepatic steatosis 11/27/2014   Hx of adenomatous colonic polyps 11/27/2014   Visit for preventive health examination 03/28/2014   Hyperlipidemia 03/28/2014   Left rotator cuff tear 07/07/2013   Tear of left rotator cuff 07/07/2013   Rhinitis 06/01/2013   Chest wall deformity 04/12/2012   Atrial septal aneurysm / if pfo  echo 6 13  10/23/2011   OSA (obstructive sleep apnea) 11/27/2010   Dyslipidemia 11/01/2010   Flushing 08/29/2010   Labile hypertension 08/29/2010   MUSCLE CRAMPS, FOOT 06/10/2010   OTHER SLEEP DISTURBANCES 06/10/2010   ANXIETY, SITUATIONAL 05/08/2010   VERTIGO, POSITIONAL 03/15/2010   VITAMIN D DEFICIENCY 02/18/2010   OBESITY 02/18/2010   ALLERGIC RHINITIS 02/18/2010   PLANTAR FASCIITIS 02/18/2010   TWITCHING 06/11/2009   NUMBNESS, HAND 06/11/2009   CERVICAL STRAIN, ACUTE 06/11/2009   OTHER MALAISE AND FATIGUE 07/09/2007   HYPERTENSION 01/28/2007   Hypothyroidism 11/11/2006   COMMON MIGRAINE 11/11/2006   GERD 11/11/2006   FIBROMYALGIA 11/11/2006    [name], PTA 04/22/2021, 10:17 AM  [facility] @ [place], [place], [address] Phone: [phone]    Fax:  [phone]  Name: [name] MRN: [id number] Date of Birth: [birth year]

## 2021-04-24 DIAGNOSIS — L820 Inflamed seborrheic keratosis: Secondary | ICD-10-CM | POA: Diagnosis not present

## 2021-04-24 DIAGNOSIS — Z85828 Personal history of other malignant neoplasm of skin: Secondary | ICD-10-CM | POA: Diagnosis not present

## 2021-04-24 DIAGNOSIS — L821 Other seborrheic keratosis: Secondary | ICD-10-CM | POA: Diagnosis not present

## 2021-04-24 DIAGNOSIS — L589 Radiodermatitis, unspecified: Secondary | ICD-10-CM | POA: Diagnosis not present

## 2021-04-24 DIAGNOSIS — D2372 Other benign neoplasm of skin of left lower limb, including hip: Secondary | ICD-10-CM | POA: Diagnosis not present

## 2021-04-24 DIAGNOSIS — L578 Other skin changes due to chronic exposure to nonionizing radiation: Secondary | ICD-10-CM | POA: Diagnosis not present

## 2021-04-24 DIAGNOSIS — Z86018 Personal history of other benign neoplasm: Secondary | ICD-10-CM | POA: Diagnosis not present

## 2021-04-24 DIAGNOSIS — D2261 Melanocytic nevi of right upper limb, including shoulder: Secondary | ICD-10-CM | POA: Diagnosis not present

## 2021-04-24 DIAGNOSIS — D225 Melanocytic nevi of trunk: Secondary | ICD-10-CM | POA: Diagnosis not present

## 2021-04-24 DIAGNOSIS — D2272 Melanocytic nevi of left lower limb, including hip: Secondary | ICD-10-CM | POA: Diagnosis not present

## 2021-04-24 DIAGNOSIS — D2271 Melanocytic nevi of right lower limb, including hip: Secondary | ICD-10-CM | POA: Diagnosis not present

## 2021-04-26 ENCOUNTER — Telehealth: Payer: Self-pay

## 2021-04-26 NOTE — Telephone Encounter (Signed)
Patient spoke with triage nurse on 12/16 regarding felling of her heart pounding. I spoke with the pt and she informed me that when she woke up this morning her heart was pounding and she took her blood pressure and got a reading of 153/81. Pt reported that after doing breathing exercises she calmed down and retook her blood pressure and got a reading of 137/69. Pt also reported that she has been taking HCTZ 25mg  for a month now but had previously been taking 12.5 mg because it had been helping with cramping in her feet. Pt believes that taking HCTZ 25 mg is causing issues. Pt has contacted cardiology in regards to this but would like for PCP to advise as well since she has prescribed HTZC and Potassium chloride 20 meq. Patient stated that her potassium has been low recently. Pt states that she has been feeling dizzy lately specifically when standing up from resting. Pt denies any chest pressure/tightness at this time. Pt agreed to send in BP reading to Mychart to be reviewed by PCP. Virtual visit scheduled to discuss issues on 05/02/2021.

## 2021-04-28 NOTE — Telephone Encounter (Signed)
Will  fu at upcoming visit.   Lab Results  Component Value Date   WBC 7.6 04/11/2021   HGB 13.1 04/11/2021   HCT 40.3 04/11/2021   PLT 200 04/11/2021   GLUCOSE 88 04/11/2021   CHOL 226 (A) 06/05/2020   TRIG 56 08/05/2019   HDL 57 06/05/2020   LDLDIRECT 165.2 12/27/2012   LDLCALC 156 06/05/2020   ALT 33 04/11/2021   AST 26 04/11/2021   NA 141 04/11/2021   K 3.4 (L) 04/11/2021   CL 103 04/11/2021   CREATININE 0.78 04/11/2021   BUN 18 04/11/2021   CO2 28 04/11/2021   TSH 0.674 04/11/2021   HGBA1C 5.2 06/01/2013

## 2021-05-01 DIAGNOSIS — M9901 Segmental and somatic dysfunction of cervical region: Secondary | ICD-10-CM | POA: Diagnosis not present

## 2021-05-01 DIAGNOSIS — M9902 Segmental and somatic dysfunction of thoracic region: Secondary | ICD-10-CM | POA: Diagnosis not present

## 2021-05-01 DIAGNOSIS — M9903 Segmental and somatic dysfunction of lumbar region: Secondary | ICD-10-CM | POA: Diagnosis not present

## 2021-05-01 DIAGNOSIS — M531 Cervicobrachial syndrome: Secondary | ICD-10-CM | POA: Diagnosis not present

## 2021-05-02 ENCOUNTER — Telehealth: Payer: Medicare HMO | Admitting: Internal Medicine

## 2021-05-02 DIAGNOSIS — R5381 Other malaise: Secondary | ICD-10-CM | POA: Diagnosis not present

## 2021-05-02 DIAGNOSIS — E039 Hypothyroidism, unspecified: Secondary | ICD-10-CM | POA: Diagnosis not present

## 2021-05-02 DIAGNOSIS — R945 Abnormal results of liver function studies: Secondary | ICD-10-CM | POA: Diagnosis not present

## 2021-05-02 DIAGNOSIS — N959 Unspecified menopausal and perimenopausal disorder: Secondary | ICD-10-CM | POA: Diagnosis not present

## 2021-05-08 ENCOUNTER — Other Ambulatory Visit: Payer: Self-pay

## 2021-05-08 ENCOUNTER — Ambulatory Visit: Payer: Medicare HMO

## 2021-05-08 DIAGNOSIS — M6281 Muscle weakness (generalized): Secondary | ICD-10-CM | POA: Diagnosis not present

## 2021-05-08 DIAGNOSIS — R600 Localized edema: Secondary | ICD-10-CM

## 2021-05-08 DIAGNOSIS — R293 Abnormal posture: Secondary | ICD-10-CM | POA: Diagnosis not present

## 2021-05-08 DIAGNOSIS — Z483 Aftercare following surgery for neoplasm: Secondary | ICD-10-CM | POA: Diagnosis not present

## 2021-05-08 DIAGNOSIS — M25611 Stiffness of right shoulder, not elsewhere classified: Secondary | ICD-10-CM | POA: Diagnosis not present

## 2021-05-08 NOTE — Therapy (Signed)
[facility] @ [place], [place], [address] Phone: [phone]   Fax:  [phone]  Physical Therapy Evaluation  Patient Details  Name: [name] MRN: [id number] Date of Birth: 02-23-1956 Referring Provider ([name]): Cira Rue   Encounter Date: 05/08/2021   [name] End of Session - 05/08/21 1653     Visit Number 1    Number of Visits 12    Date for [name] Re-Evaluation 06/19/21    [name] Start Time 1600    [name] Stop Time 1648    [name] Time Calculation (min) 48 min    Activity Tolerance Patient tolerated treatment well    Behavior During Therapy [facility] for tasks assessed/performed             Past Medical History:  Diagnosis Date   Anal fissure    Atrial septal aneurysm / if pfo  echo 6 13  10/23/2011   Breast cancer ([place])    Cataract    both eyes   Depression    Fatty liver    "pre fatty liver"   Fibromyalgia    Headache(784.0)    hx of migraines when younger   Heart palpitations    hx with normal holter event monitoring   Hx: UTI (urinary tract infection)    Hyperlipidemia    Hypertension    Hypothyroidism    Obesity    Pneumonia 1972   hx of   Polyp of colon    Serrated adenoma of colon 08/2012   Skin cancer    basal, squamous cell   Sleep apnea    uses cpap    Past Surgical History:  Procedure Laterality Date   BREAST LUMPECTOMY WITH RADIOACTIVE SEED AND SENTINEL LYMPH NODE BIOPSY Right 07/26/2020   Procedure: RIGHT BREAST LUMPECTOMY WITH RADIOACTIVE SEED AND SENTINEL LYMPH NODE BIOPSY;  Surgeon: Jovita Kussmaul, MD;  Location: [facility];  Service: General;  Laterality: Right;   BREAST LUMPECTOMY WITH RADIOACTIVE SEED LOCALIZATION Left 07/26/2020   Procedure: LEFT BREAST LUMPECTOMY X 2  WITH RADIOACTIVE SEED LOCALIZATION;  Surgeon: Jovita Kussmaul, MD;  Location: [facility];  Service: General;  Laterality: Left;   COLONOSCOPY     DILATION AND CURETTAGE OF [place] ARTHROSCOPY     both in past   POLYPECTOMY     [place] Left 07/07/2013   Procedure: LEFT SHOULDER MINI OPEN SUBACROMIAL DECOMPRESSION ROTATOR CUFF REPAIR AND POSSIBLE PATCH GRAFT ;  Surgeon: Johnn Hai, MD;  [facility];  Service: Orthopedics;  Laterality: Left;  with interscaline block   SKIN CANCER EXCISION Bilateral    arm, legs, and chest   TONSILLECTOMY      There were no vitals filed for this visit.    Subjective Assessment - 05/08/21 1600     Subjective On Nov 1st had breast aspiration on the right.They drained the seroma and within a week she developed numbness in  medial 2 fingers, then it travelled to her whole hand and arm if she sleeps on her right side.   The axillary region stays sore. She has had pain in the left side of her neck radiating down the left arm from neck problems in the past, but this is differentDr. Lewayne Bunting nurse suggested she return for [name]. She had an MRI of her neck in December and they told her it was fine. 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 She underwent a left lumpectomy for DCIS and a right lumpectomy and sentinel node biopsy on 07/26/2020. She had 2 lymph nodes removed with 1 being positive for cancer on the right side. It is ER/PR positive. Left shoulder rotator cuff repair 06/2013. Last radiation on 01/15/21.  Seroma that was drained x 2-3.    Patient Stated Goals Reduce the numbness/pain, be able to sleep on right side,improve shoulderROM, decrease tightness    Currently in Pain? Yes    Pain Score 4    and hand numbs   Pain Location Axilla    Pain Orientation Left    Pain Descriptors / Indicators Tightness;Aching;Numbness    Pain Type Surgical pain    Pain Onset More than a month ago    Pain Frequency Constant    Aggravating Factors  stretching arms out to the side, sleeping on  right side    Multiple Pain Sites No                [facility] [name] Assessment - 05/08/21 0001       Assessment   Medical Diagnosis s/p bil lumpectomy and right SLNB    Referring Provider ([name]) Cira Rue    Onset Date/Surgical Date 07/26/20    Hand Dominance Right      Restrictions   Weight Bearing Restrictions No      Balance Screen   Has the patient fallen in the past 6 months No    Has the patient had a decrease in activity level because of a fear of falling?  No    Is the patient reluctant to leave their home because of a fear of falling?  No      Home Ecologist residence      Prior Function   Level of Independence Independent      Cognition   Overall Cognitive Status Within Functional Limits for tasks assessed      Observation/Other Assessments   Observations Right breast with enlarged pores, increased size and firmness compared to left.      Posture/Postural Control   Posture/Postural Control Postural limitations    Postural Limitations Rounded Shoulders;Forward head      AROM   Right Shoulder Extension 62 Degrees    Right Shoulder Flexion 155 Degrees   fingers tingle, ache in axilla   Right Shoulder ABduction 163 Degrees   tight axilla, numb in medial 2 fingers   Right Shoulder External Rotation 80 Degrees   numbness   Left Shoulder Extension 70 Degrees    Left Shoulder Flexion 160 Degrees    Left Shoulder ABduction 175 Degrees    Left Shoulder External Rotation 80 Degrees    Cervical Flexion WNL    Cervical - Right Side Bend WFL    Cervical - Left Side Bend WFL   hurts on left posterior shoulder   Cervical - Right Rotation WFL    Cervical - Left Rotation WFL   feels in left post shoulder     Palpation   Palpation comment firm area in region of prior seroma draining right axillary region   very tender right axillary and clavicular border of pectorals, lats.                       Objective measurements  completed on examination:  See above findings.                     [name] Long Term Goals - 05/08/21 1708       [name] LONG TERM GOAL #1   Title [name] will have decreased  right axillary pain/tightness by atleast 50%    Time 6    Period Weeks    Status New    Target Date 06/19/21      [name] LONG TERM GOAL #2   Title [name] will be independent in self MLD of right breast to decrease swelling    Time 6    Period Weeks    Status New    Target Date 06/19/21      [name] LONG TERM GOAL #3   Title [name] will be able to stretch right shoulder/axillary region with minimal numbness    Time 6    Period Weeks    Status New    Target Date 06/19/21      [name] LONG TERM GOAL #4   Title [name] will be able to lie on right side without right arm numbness    Time 6    Period Weeks    Status New    Target Date 06/19/21                    Plan - 05/08/21 1657     Clinical Impression Statement [name] returns for [name] with complaints of right axillary tightness and limitations in ROM, Right breast swelling, and new right hand numbness in medial 2 fingers with stretching activities, but can be in the entire arm and hand with sleeping on the right side.  She had a neck MRI that she was told is normal and cervical testing today did not reproduce right arm numbness. She is very tender to palpation throughout the right pectorals and lats. right breast is larger and with enlarged pores. Firmness is noted in region of seroma that was drained in right axillary region.   Personal Factors and Comorbidities Age;Time since onset of injury/illness/exacerbation;Comorbidity 3+    Comorbidities Bilateral breast CA with Right SLNB, radiation,chemo, FMS,hypothyroidism, left RTC repair, cervical problems mainly on left    Stability/Clinical Decision Making Stable/Uncomplicated    Clinical Decision Making Low    Rehab Potential Good    [name] Frequency 2x / week    [name] Duration 6 weeks    [name] Treatment/Interventions ADLs/Self Care  Home Management;Therapeutic exercise;Patient/family education;Neuromuscular re-education;Manual techniques;Manual lymph drainage;Passive range of motion;Taping    [name] Next Visit Plan STM to right pectorals especially axillary border and lats, MFR techniques,NTS, PROM, progress to right breast MLD, self MLD    Recommended Other Services ? compression cami for sleeping   Consulted and Agree with Plan of Care Patient             Patient will benefit from skilled therapeutic intervention in order to improve the following deficits and impairments:  Decreased activity tolerance, Decreased range of motion, Pain, Impaired sensation, Increased edema, Postural dysfunction, Decreased strength  Visit Diagnosis: Aftercare following surgery for neoplasm  Localized edema  Abnormal posture  Stiffness of right shoulder, not elsewhere classified  Muscle weakness (generalized)     Problem List Patient Active Problem List   Diagnosis Date Noted   Malignant neoplasm of upper-outer quadrant of right breast in female, estrogen receptor positive ([place]) 06/25/2020   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   Hepatic steatosis 11/27/2014  Hx of adenomatous colonic polyps 11/27/2014   Visit for preventive health examination 03/28/2014   Hyperlipidemia 03/28/2014   Left rotator cuff tear 07/07/2013   Tear of left rotator cuff 07/07/2013   Rhinitis 06/01/2013   Chest wall deformity 04/12/2012   Atrial septal aneurysm / if pfo  echo 6 13  10/23/2011   OSA (obstructive sleep apnea) 11/27/2010   Dyslipidemia 11/01/2010   Flushing 08/29/2010   Labile hypertension 08/29/2010   MUSCLE CRAMPS, FOOT 06/10/2010   OTHER SLEEP DISTURBANCES 06/10/2010   ANXIETY, SITUATIONAL 05/08/2010   VERTIGO, POSITIONAL 03/15/2010   VITAMIN D DEFICIENCY 02/18/2010   OBESITY 02/18/2010   ALLERGIC RHINITIS 02/18/2010   PLANTAR FASCIITIS 02/18/2010   TWITCHING 06/11/2009   NUMBNESS, HAND 06/11/2009    CERVICAL STRAIN, ACUTE 06/11/2009   OTHER MALAISE AND FATIGUE 07/09/2007   HYPERTENSION 01/28/2007   Hypothyroidism 11/11/2006   COMMON MIGRAINE 11/11/2006   GERD 11/11/2006   FIBROMYALGIA 11/11/2006    [name], [name] 05/08/2021, 5:12 PM  [facility] @ [place], [place], [address] Phone: [phone]   Fax:  [phone]  Name: [name] MRN: [id number] Date of Birth: 1956/04/20

## 2021-05-13 DIAGNOSIS — G4733 Obstructive sleep apnea (adult) (pediatric): Secondary | ICD-10-CM | POA: Diagnosis not present

## 2021-05-14 ENCOUNTER — Other Ambulatory Visit: Payer: Self-pay | Admitting: Nurse Practitioner

## 2021-05-14 DIAGNOSIS — C50411 Malignant neoplasm of upper-outer quadrant of right female breast: Secondary | ICD-10-CM

## 2021-05-15 DIAGNOSIS — M9901 Segmental and somatic dysfunction of cervical region: Secondary | ICD-10-CM | POA: Diagnosis not present

## 2021-05-15 DIAGNOSIS — M9902 Segmental and somatic dysfunction of thoracic region: Secondary | ICD-10-CM | POA: Diagnosis not present

## 2021-05-15 DIAGNOSIS — M9903 Segmental and somatic dysfunction of lumbar region: Secondary | ICD-10-CM | POA: Diagnosis not present

## 2021-05-15 DIAGNOSIS — M531 Cervicobrachial syndrome: Secondary | ICD-10-CM | POA: Diagnosis not present

## 2021-05-17 ENCOUNTER — Other Ambulatory Visit: Payer: Self-pay

## 2021-05-17 ENCOUNTER — Encounter: Payer: Self-pay | Admitting: Physical Therapy

## 2021-05-17 ENCOUNTER — Ambulatory Visit: Payer: Medicare HMO | Attending: Hematology | Admitting: Physical Therapy

## 2021-05-17 DIAGNOSIS — M6281 Muscle weakness (generalized): Secondary | ICD-10-CM | POA: Insufficient documentation

## 2021-05-17 DIAGNOSIS — Z483 Aftercare following surgery for neoplasm: Secondary | ICD-10-CM | POA: Diagnosis not present

## 2021-05-17 DIAGNOSIS — M25611 Stiffness of right shoulder, not elsewhere classified: Secondary | ICD-10-CM | POA: Diagnosis not present

## 2021-05-17 DIAGNOSIS — R293 Abnormal posture: Secondary | ICD-10-CM | POA: Diagnosis not present

## 2021-05-17 DIAGNOSIS — D0512 Intraductal carcinoma in situ of left breast: Secondary | ICD-10-CM | POA: Insufficient documentation

## 2021-05-17 DIAGNOSIS — R600 Localized edema: Secondary | ICD-10-CM | POA: Insufficient documentation

## 2021-05-17 NOTE — Therapy (Signed)
[facility] @ [place], [place], [address] Phone: [phone]   Fax:  [phone]  Physical Therapy Treatment  Patient Details  Name: [name] MRN: [id number] Date of Birth: [birth year] Referring Provider (PT): Cira Rue   Encounter Date: 05/17/2021   PT End of Session - 05/17/21 1200     Visit Number 2    Number of Visits 12    Date for PT Re-Evaluation 06/19/21    PT Start Time 1103    PT Stop Time 9628    PT Time Calculation (min) 50 min    Activity Tolerance Patient tolerated treatment well    Behavior During Therapy [facility] for tasks assessed/performed             Past Medical History:  Diagnosis Date   Anal fissure    Atrial septal aneurysm / if pfo  echo 6 13  10/23/2011   Breast cancer ([place])    Cataract    both eyes   Depression    Fatty liver    "pre fatty liver"   Fibromyalgia    Headache(784.0)    hx of migraines when younger   Heart palpitations    hx with normal holter event monitoring   Hx: UTI (urinary tract infection)    Hyperlipidemia    Hypertension    Hypothyroidism    Obesity    Pneumonia 1972   hx of   Polyp of colon    Serrated adenoma of colon 08/2012   Skin cancer    basal, squamous cell   Sleep apnea    uses cpap    Past Surgical History:  Procedure Laterality Date   BREAST LUMPECTOMY WITH RADIOACTIVE SEED AND SENTINEL LYMPH NODE BIOPSY Right 07/26/2020   Procedure: RIGHT BREAST LUMPECTOMY WITH RADIOACTIVE SEED AND SENTINEL LYMPH NODE BIOPSY;  Surgeon: Jovita Kussmaul, MD;  Location: [facility];  Service: General;  Laterality: Right;   BREAST LUMPECTOMY WITH RADIOACTIVE SEED LOCALIZATION Left 07/26/2020   Procedure: LEFT BREAST LUMPECTOMY X 2  WITH RADIOACTIVE SEED LOCALIZATION;  Surgeon: Jovita Kussmaul, MD;  Location: [facility];  Service: General;  Laterality: Left;   COLONOSCOPY     DILATION AND CURETTAGE OF [place]  ARTHROSCOPY     both in past   POLYPECTOMY     [place] Left 07/07/2013   Procedure: LEFT SHOULDER MINI OPEN SUBACROMIAL DECOMPRESSION ROTATOR CUFF REPAIR AND POSSIBLE PATCH GRAFT ;  Surgeon: Johnn Hai, MD;  Location: [facility];  Service: Orthopedics;  Laterality: Left;  with interscaline block   SKIN CANCER EXCISION Bilateral    arm, legs, and chest   TONSILLECTOMY      There were no vitals filed for this visit.   Subjective Assessment - 05/17/21 1103     Subjective I had a seroma and it had to be drained twice. It developed a lot of soreness. I tried to do the stretches but it is so sore across the chest and back. I have been trying to do the massage.    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 She underwent a left lumpectomy for DCIS and a right lumpectomy and sentinel node biopsy on 07/26/2020. She had 2 lymph nodes removed with 1 being positive for cancer on the right side. It is ER/PR positive. Left shoulder rotator  cuff repair 06/2013. Last radiation on 01/15/21.  Seroma that was drained x 2-3.    Patient Stated Goals Reduce the numbness/pain, be able to sleep on right side,improve shoulderROM, decrease tightness    Currently in Pain? Yes    Pain Score 2     Pain Location Axilla    Pain Orientation Right    Pain Descriptors / Indicators Tightness;Aching    Pain Type Surgical pain    Pain Onset More than a month ago    Pain Frequency Constant    Aggravating Factors  stretching arms out to the side, sleeping on right side    Pain Relieving Factors taking the pressure off    Effect of Pain on Daily Activities hard to sleep, cooking and leaning forward is difficult                               [facility] Adult PT Treatment/Exercise - 05/17/21 0001       Manual Therapy   Manual Therapy Manual Lymphatic Drainage (MLD);Soft tissue mobilization;Neural Stretch;Edema  management    Edema Management cut 1/2 grey foam for pt to wear in axillary area of compression bra to help decrease friction    Soft tissue mobilization in supine to R pec and then to L sidelying to R lats, serratus, upper traps and rhomboids with increased tightness noted in lats, serratus and UT    Manual Lymphatic Drainage (MLD) in supine: short neck, 5 diaphragmatic breaths, left axillary lymph nodes. right inguinal nodes, establishment of axillo inguinal anastomosis and inter axillary anastomosis then R breast moving fluid towards pathways while educating pt in correct skin stretch technique then retracing all steps    Neural Stretch instructed pt in ulnar nerve stretch (owl eye technique) and pt could feel a good stretch with this                          PT Long Term Goals - 05/08/21 1708       PT LONG TERM GOAL #1   Title Pt will have decreased axillary pain/tightness by atleast 50%    Time 6    Period Weeks    Status New    Target Date 06/19/21      PT LONG TERM GOAL #2   Title Pt will be independent in self MLD of right breast to decrease swelling    Time 6    Period Weeks    Status New    Target Date 06/19/21      PT LONG TERM GOAL #3   Title Pt will be able to stretch right shoulder/axillary region with minimal numbness    Time 6    Period Weeks    Status New    Target Date 06/19/21      PT LONG TERM GOAL #4   Title pt will be able to lie on right side without right arm numbness    Time 6    Period Weeks    Status New    Target Date 06/19/21                   Plan - 05/17/21 1202     Clinical Impression Statement Instructed pt in ulnar nerve stretch today since she has numbness in lateral 2 fingers. Also began R breast MLD today and instructed pt in anatomy and physiology of the lymphatic system and correct skin  stretch technique. Educated pt to begin wearing her compression bra and cut her a piece of grey foam to place over axillary  region to decrease friction from bra in this area. Breast softened signficantly after MLD and pore size decreased. Then focused on soft tissue mobilization to tight musculature including pec, lats and serratus with numerous areas of muscle tightness noted.    PT Frequency 2x / week    PT Duration 6 weeks    PT Treatment/Interventions ADLs/Self Care Home Management;Therapeutic exercise;Patient/family education;Neuromuscular re-education;Manual techniques;Manual lymph drainage;Passive range of motion;Taping    PT Next Visit Plan STM to right pectorals especially axillary border and lats, MFR techniques, PROM, progress to right breast MLD, self MLD - ask how foam worked    Oncologist with Plan of Care Patient             Patient will benefit from skilled therapeutic intervention in order to improve the following deficits and impairments:  Decreased activity tolerance, Decreased range of motion, Pain, Impaired sensation, Increased edema, Postural dysfunction, Decreased strength  Visit Diagnosis: Aftercare following surgery for neoplasm  Localized edema  Abnormal posture  Stiffness of right shoulder, not elsewhere classified  Muscle weakness (generalized)  Ductal carcinoma in situ (DCIS) of left breast     Problem List Patient Active Problem List   Diagnosis Date Noted   Malignant neoplasm of upper-outer quadrant of right breast in female, estrogen receptor positive ([place]) 06/25/2020   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   Hepatic steatosis 11/27/2014   Hx of adenomatous colonic polyps 11/27/2014   Visit for preventive health examination 03/28/2014   Hyperlipidemia 03/28/2014   Left rotator cuff tear 07/07/2013   Tear of left rotator cuff 07/07/2013   Rhinitis 06/01/2013   Chest wall deformity 04/12/2012   Atrial septal aneurysm / if pfo  echo 6 13  10/23/2011   OSA (obstructive sleep apnea) 11/27/2010   Dyslipidemia 11/01/2010   Flushing  08/29/2010   Labile hypertension 08/29/2010   MUSCLE CRAMPS, FOOT 06/10/2010   OTHER SLEEP DISTURBANCES 06/10/2010   ANXIETY, SITUATIONAL 05/08/2010   VERTIGO, POSITIONAL 03/15/2010   VITAMIN D DEFICIENCY 02/18/2010   OBESITY 02/18/2010   ALLERGIC RHINITIS 02/18/2010   PLANTAR FASCIITIS 02/18/2010   TWITCHING 06/11/2009   NUMBNESS, HAND 06/11/2009   CERVICAL STRAIN, ACUTE 06/11/2009   OTHER MALAISE AND FATIGUE 07/09/2007   HYPERTENSION 01/28/2007   Hypothyroidism 11/11/2006   COMMON MIGRAINE 11/11/2006   GERD 11/11/2006   FIBROMYALGIA 11/11/2006    Manus Gunning, PT 05/17/2021, 12:04 PM  [facility] @ [place], [place], [address] Phone: [phone]   Fax:  [phone]  Name: [name] MRN: [id number] Date of Birth: May 26, 1955

## 2021-05-20 ENCOUNTER — Telehealth: Payer: Self-pay

## 2021-05-20 NOTE — Telephone Encounter (Signed)
Pt LVM stating that [facility] is no longer offering the patient assistance program with the Exemestane medication Dr. Burr Medico has prescribed.  Pt would like to talk to Dr. Burr Medico about now since this program is no longer offered by [employer] what New medication is Dr. Burr Medico going to prescribe because the pt stated she cannot afford Exemestane.  Forwarded message to Dr. Burr Medico to follow-up with pt regarding Exemestane.

## 2021-05-21 ENCOUNTER — Encounter: Payer: Self-pay | Admitting: Hematology

## 2021-05-21 ENCOUNTER — Other Ambulatory Visit: Payer: Self-pay

## 2021-05-21 ENCOUNTER — Ambulatory Visit: Payer: Medicare HMO

## 2021-05-21 ENCOUNTER — Other Ambulatory Visit (HOSPITAL_COMMUNITY): Payer: Self-pay

## 2021-05-21 DIAGNOSIS — M25611 Stiffness of right shoulder, not elsewhere classified: Secondary | ICD-10-CM | POA: Diagnosis not present

## 2021-05-21 DIAGNOSIS — Z483 Aftercare following surgery for neoplasm: Secondary | ICD-10-CM | POA: Diagnosis not present

## 2021-05-21 DIAGNOSIS — R293 Abnormal posture: Secondary | ICD-10-CM | POA: Diagnosis not present

## 2021-05-21 DIAGNOSIS — M6281 Muscle weakness (generalized): Secondary | ICD-10-CM | POA: Diagnosis not present

## 2021-05-21 DIAGNOSIS — D0512 Intraductal carcinoma in situ of left breast: Secondary | ICD-10-CM | POA: Diagnosis not present

## 2021-05-21 DIAGNOSIS — R600 Localized edema: Secondary | ICD-10-CM | POA: Diagnosis not present

## 2021-05-21 MED ORDER — ANASTROZOLE 1 MG PO TABS
1.0000 mg | ORAL_TABLET | Freq: Every day | ORAL | 3 refills | Status: DC
Start: 2021-05-21 — End: 2021-05-22
  Filled 2021-05-21: qty 30, 30d supply, fill #0

## 2021-05-21 NOTE — Patient Instructions (Signed)
Self manual lymph drainage: Perform this sequence once a day.  Only give enough pressure to your skin to make the skin move.  Diaphragmatic - Supine   Inhale through nose making navel move out toward hands. Exhale through puckered lips, hands follow navel in. Repeat _5__ times. Rest _10__ seconds between repeats.   Copyright  VHI. All rights reserved.  Hug yourself.  Do circles at your neck just above your collarbones.  Repeat this 10 times.  Axilla - One at a Time   Using full weight of flat hand and fingers at center of uninvolved armpit, make _10__ in-place circles.   Copyright  VHI. All rights reserved.  LEG: Inguinal Nodes Stimulation   With small finger side of hand against hip crease on involved side, gently perform circles at the crease. Repeat __10_ times.   Copyright  VHI. All rights reserved.  Axilla to Inguinal Nodes - Sweep   On involved side, sweep _4__ times from armpit along side of trunk to hip crease.  Now gently stretch skin from the involved side to the uninvolved side across the chest at the shoulder line.  Repeat that 4 times.  Draw an imaginary diagonal line from upper outer breast through the nipple area toward lower inner breast.  Direct fluid upward and inward from this line toward the pathway across your upper chest .  Do this in three rows to treat all of the upper inner breast tissue, and do each row 3-4x.      Direct fluid to treat all of lower outer breast tissue downward and outward toward  pathway that is aimed at the left groin.  Finish by doing the pathways as described above going from your involved armpit to the same side groin and going across your upper chest from the involved shoulder to the uninvolved shoulder.  Repeat the steps above where you do circles in your right groin and left armpit. Copyright  VHI. All rights reserved.

## 2021-05-21 NOTE — Therapy (Signed)
[facility] @ [place], [place], [address] Phone: [phone]   Fax:  [phone]  Physical Therapy Treatment  Patient Details  Name: [name] MRN: [id number] Date of Birth: [birth year] Referring Provider (PT): Cira Rue   Encounter Date: 05/21/2021   PT End of Session - 05/21/21 1003     Visit Number 3    Number of Visits 12    Date for PT Re-Evaluation 06/19/21    PT Start Time 0907    PT Stop Time 7253    PT Time Calculation (min) 55 min    Activity Tolerance Patient tolerated treatment well    Behavior During Therapy [facility] for tasks assessed/performed             Past Medical History:  Diagnosis Date   Anal fissure    Atrial septal aneurysm / if pfo  echo 6 13  10/23/2011   Breast cancer ([place])    Cataract    both eyes   Depression    Fatty liver    "pre fatty liver"   Fibromyalgia    Headache(784.0)    hx of migraines when younger   Heart palpitations    hx with normal holter event monitoring   Hx: UTI (urinary tract infection)    Hyperlipidemia    Hypertension    Hypothyroidism    Obesity    Pneumonia 1972   hx of   Polyp of colon    Serrated adenoma of colon 08/2012   Skin cancer    basal, squamous cell   Sleep apnea    uses cpap    Past Surgical History:  Procedure Laterality Date   BREAST LUMPECTOMY WITH RADIOACTIVE SEED AND SENTINEL LYMPH NODE BIOPSY Right 07/26/2020   Procedure: RIGHT BREAST LUMPECTOMY WITH RADIOACTIVE SEED AND SENTINEL LYMPH NODE BIOPSY;  Surgeon: Jovita Kussmaul, MD;  Location: [facility];  Service: General;  Laterality: Right;   BREAST LUMPECTOMY WITH RADIOACTIVE SEED LOCALIZATION Left 07/26/2020   Procedure: LEFT BREAST LUMPECTOMY X 2  WITH RADIOACTIVE SEED LOCALIZATION;  Surgeon: Jovita Kussmaul, MD;  Location: [facility];  Service: General;  Laterality: Left;   COLONOSCOPY     DILATION AND CURETTAGE OF [place]  ARTHROSCOPY     both in past   POLYPECTOMY     [place] Left 07/07/2013   Procedure: LEFT SHOULDER MINI OPEN SUBACROMIAL DECOMPRESSION ROTATOR CUFF REPAIR AND POSSIBLE PATCH GRAFT ;  Surgeon: Johnn Hai, MD;  [facility];  Service: Orthopedics;  Laterality: Left;  with interscaline block   SKIN CANCER EXCISION Bilateral    arm, legs, and chest   TONSILLECTOMY      There were no vitals filed for this visit.   Subjective Assessment - 05/21/21 0915     Subjective [name] really worked on my breast lymphedema last time and that helped. The tightness isn't as bad. I also started trying to do the massage but I don't know if I was doing it right.    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 She underwent a left lumpectomy for DCIS and a right lumpectomy and sentinel node biopsy on 07/26/2020. She had 2 lymph nodes removed with 1 being positive for cancer on the right side. It is ER/PR positive. Left shoulder rotator cuff repair 06/2013. Last radiation on  01/15/21.  Seroma that was drained x 2-3.    Patient Stated Goals Reduce the numbness/pain, be able to sleep on right side,improve shoulderROM, decrease tightness    Currently in Pain? Yes    Pain Score 3     Pain Location Scapula    Pain Orientation Right    Pain Descriptors / Indicators Sore   feels bruised   Pain Type Surgical pain    Pain Onset More than a month ago    Pain Frequency Constant    Aggravating Factors  sleeping on right side    Pain Relieving Factors massage and stretch                               [facility] Adult PT Treatment/Exercise - 05/21/21 0001       Manual Therapy   Manual Therapy Manual Lymphatic Drainage (MLD);Soft tissue mobilization;Neural Stretch;Edema management;Myofascial release;Scapular mobilization    Edema Management cut 1/2 grey foam for pt to wear in axillary area of compression  bra to help decrease friction (replaced one she lost after last session)    Soft tissue mobilization in supine to R pec and then to L sidelying to R lats, serratus, upper traps and with cocoa butter to periscapular area    Myofascial Release To Rt axilla during P/ROM    Scapular Mobilization In Lt S/L to Rt scapula into retraction and protraction    Manual Lymphatic Drainage (MLD) in supine: short neck, 5 diaphragmatic breaths, left axillary lymph nodes. right inguinal nodes, establishment of Rt axilllo inguinal anastomosis and anterior inter axillary anastomosis then R breast moving fluid towards pathways while educating pt in correct skin stretch technique having her return demo, then retracing all steps    Neural Stretch To Rt UE during P/ROM                     PT Education - 05/21/21 1008     Education Details Self MLD    Person(s) Educated Patient    Methods Explanation;Demonstration;Handout    Comprehension Verbalized understanding;Returned demonstration;Tactile cues required;Need further instruction                 PT Long Term Goals - 05/08/21 1708       PT LONG TERM GOAL #1   Title Pt will have decreased axillary pain/tightness by atleast 50%    Time 6    Period Weeks    Status New    Target Date 06/19/21      PT LONG TERM GOAL #2   Title Pt will be independent in self MLD of right breast to decrease swelling    Time 6    Period Weeks    Status New    Target Date 06/19/21      PT LONG TERM GOAL #3   Title Pt will be able to stretch right shoulder/axillary region with minimal numbness    Time 6    Period Weeks    Status New    Target Date 06/19/21      PT LONG TERM GOAL #4   Title pt will be able to lie on right side without right arm numbness    Time 6    Period Weeks    Status New    Target Date 06/19/21                   Plan - 05/21/21 1004  Clinical Impression Statement Continued with manual therapy continuing to  instruct pt in while performing. Also issued handout for self MLD. Pt will benefit from further review of this to assess pressure and effective skin stretch. Also continued with STM and added scapular mobs in Lt S/L where pt reports tightness. Latent trigger points present at medial scapular border. Reissued gray foam for pt to try wearing in her compression bra at area of seroma.    Personal Factors and Comorbidities Age;Time since onset of injury/illness/exacerbation;Comorbidity 3+    Comorbidities Bilateral breast CA with Right SLNB, radiation,chemo, FMS,hypothyroidism, left RTC repair, cervical problems mainly on left    Stability/Clinical Decision Making Stable/Uncomplicated    Rehab Potential Good    PT Frequency 2x / week    PT Duration 6 weeks    PT Treatment/Interventions ADLs/Self Care Home Management;Therapeutic exercise;Patient/family education;Neuromuscular re-education;Manual techniques;Manual lymph drainage;Passive range of motion;Taping    PT Next Visit Plan STM to right pectorals especially axillary border and lats, MFR techniques, PROM, progress to right breast MLD, self MLD - ask how foam worked    PT Home Exercise Plan Self MLD daily    Consulted and Agree with Plan of Care Patient             Patient will benefit from skilled therapeutic intervention in order to improve the following deficits and impairments:  Decreased activity tolerance, Decreased range of motion, Pain, Impaired sensation, Increased edema, Postural dysfunction, Decreased strength  Visit Diagnosis: Aftercare following surgery for neoplasm  Localized edema  Abnormal posture  Stiffness of right shoulder, not elsewhere classified  Muscle weakness (generalized)  Ductal carcinoma in situ (DCIS) of left breast     Problem List Patient Active Problem List   Diagnosis Date Noted   Malignant neoplasm of upper-outer quadrant of right breast in female, estrogen receptor positive ([place]) 06/25/2020    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   Hepatic steatosis 11/27/2014   Hx of adenomatous colonic polyps 11/27/2014   Visit for preventive health examination 03/28/2014   Hyperlipidemia 03/28/2014   Left rotator cuff tear 07/07/2013   Tear of left rotator cuff 07/07/2013   Rhinitis 06/01/2013   Chest wall deformity 04/12/2012   Atrial septal aneurysm / if pfo  echo 6 13  10/23/2011   OSA (obstructive sleep apnea) 11/27/2010   Dyslipidemia 11/01/2010   Flushing 08/29/2010   Labile hypertension 08/29/2010   MUSCLE CRAMPS, FOOT 06/10/2010   OTHER SLEEP DISTURBANCES 06/10/2010   ANXIETY, SITUATIONAL 05/08/2010   VERTIGO, POSITIONAL 03/15/2010   VITAMIN D DEFICIENCY 02/18/2010   OBESITY 02/18/2010   ALLERGIC RHINITIS 02/18/2010   PLANTAR FASCIITIS 02/18/2010   TWITCHING 06/11/2009   NUMBNESS, HAND 06/11/2009   CERVICAL STRAIN, ACUTE 06/11/2009   OTHER MALAISE AND FATIGUE 07/09/2007   HYPERTENSION 01/28/2007   Hypothyroidism 11/11/2006   COMMON MIGRAINE 11/11/2006   GERD 11/11/2006   FIBROMYALGIA 11/11/2006    Otelia Limes, PTA 05/21/2021, 10:15 AM  [facility] @ [place], [place], [address] Phone: [phone]   Fax:  [phone]  Name: [name] MRN: [id number] Date of Birth: [birth year]

## 2021-05-21 NOTE — Progress Notes (Signed)
Exemestane discontinued d/t [employer] no longer doing pt assistance program for this medication.  Pt stated that she was told by [facility] Pt Assistance Staff that the patent on this medication is ending; therefore, they are no longer offering pt assistance with obtaining or copay assistance with this medication.  Dr. Burr Medico placed new prescription for Anastrozole 1mg  #30 with 3 refills.  Pt is to contact Dr. Ernestina Penna office should she start to experience the side effects of arthalgia and other side effects.  Instructed pt to contact Dr. Ernestina Penna office should she experience side effects.  Pt verbalized understanding of instructions.

## 2021-05-22 ENCOUNTER — Other Ambulatory Visit: Payer: Self-pay

## 2021-05-22 ENCOUNTER — Encounter: Payer: Self-pay | Admitting: Hematology

## 2021-05-22 ENCOUNTER — Other Ambulatory Visit (HOSPITAL_COMMUNITY): Payer: Self-pay

## 2021-05-22 MED ORDER — EXEMESTANE 25 MG PO TABS: 25.0000 mg | ORAL_TABLET | Freq: Every day | ORAL | 0 refills | Status: DC

## 2021-05-22 NOTE — Telephone Encounter (Signed)
New order for exemstane 25mg  was sent to [facility], a mail order pharmacy preferred by pt. Anastrozole 1mg  will be discontinued and [facility] pharmacy will be called to cancel order. Pt made aware.

## 2021-05-23 ENCOUNTER — Other Ambulatory Visit: Payer: Self-pay

## 2021-05-23 ENCOUNTER — Ambulatory Visit: Payer: Medicare HMO

## 2021-05-23 DIAGNOSIS — D0512 Intraductal carcinoma in situ of left breast: Secondary | ICD-10-CM | POA: Diagnosis not present

## 2021-05-23 DIAGNOSIS — M25611 Stiffness of right shoulder, not elsewhere classified: Secondary | ICD-10-CM | POA: Diagnosis not present

## 2021-05-23 DIAGNOSIS — R293 Abnormal posture: Secondary | ICD-10-CM | POA: Diagnosis not present

## 2021-05-23 DIAGNOSIS — Z483 Aftercare following surgery for neoplasm: Secondary | ICD-10-CM | POA: Diagnosis not present

## 2021-05-23 DIAGNOSIS — M6281 Muscle weakness (generalized): Secondary | ICD-10-CM | POA: Diagnosis not present

## 2021-05-23 DIAGNOSIS — R600 Localized edema: Secondary | ICD-10-CM

## 2021-05-23 NOTE — Therapy (Signed)
[facility] @ [facility] [place], [place], [address] Phone: [phone]   Fax:  [phone]  Physical Therapy Treatment  Patient Details  Name: [name] MRN: [id number] Date of Birth: 1955/05/22 Referring Provider (PT): Cira Rue   Encounter Date: 05/23/2021   PT End of Session - 05/23/21 0938     Visit Number 4    Number of Visits 12    Date for PT Re-Evaluation 06/19/21    PT Start Time 0904    PT Stop Time 0953    PT Time Calculation (min) 49 min    Activity Tolerance Patient tolerated treatment well    Behavior During Therapy [facility] for tasks assessed/performed             Past Medical History:  Diagnosis Date   Anal fissure    Atrial septal aneurysm / if pfo  echo 6 13  10/23/2011   Breast cancer ([place])    Cataract    both eyes   Depression    Fatty liver    "pre fatty liver"   Fibromyalgia    Headache(784.0)    hx of migraines when younger   Heart palpitations    hx with normal holter event monitoring   Hx: UTI (urinary tract infection)    Hyperlipidemia    Hypertension    Hypothyroidism    Obesity    Pneumonia 1972   hx of   Polyp of colon    Serrated adenoma of colon 08/2012   Skin cancer    basal, squamous cell   Sleep apnea    uses cpap    Past Surgical History:  Procedure Laterality Date   BREAST LUMPECTOMY WITH RADIOACTIVE SEED AND SENTINEL LYMPH NODE BIOPSY Right 07/26/2020   Procedure: RIGHT BREAST LUMPECTOMY WITH RADIOACTIVE SEED AND SENTINEL LYMPH NODE BIOPSY;  Surgeon: Jovita Kussmaul, MD;  Location: [facility];  Service: General;  Laterality: Right;   BREAST LUMPECTOMY WITH RADIOACTIVE SEED LOCALIZATION Left 07/26/2020   Procedure: LEFT BREAST LUMPECTOMY X 2  WITH RADIOACTIVE SEED LOCALIZATION;  Surgeon: Jovita Kussmaul, MD;  Location: [facility];  Service: General;  Laterality: Left;   COLONOSCOPY     DILATION AND CURETTAGE OF [place]  ARTHROSCOPY     both in past   POLYPECTOMY     [place] Left 07/07/2013   Procedure: LEFT SHOULDER MINI OPEN SUBACROMIAL DECOMPRESSION ROTATOR CUFF REPAIR AND POSSIBLE PATCH GRAFT ;  Surgeon: Johnn Hai, MD;  Location: [facility];  Service: Orthopedics;  Laterality: Left;  with interscaline block   SKIN CANCER EXCISION Bilateral    arm, legs, and chest   TONSILLECTOMY      There were no vitals filed for this visit.   Subjective Assessment - 05/23/21 0903     Subjective The numbness is doing better and I have been trying to do the MLD, but today I feel like I have been beat with a rubber mallet. 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 She underwent a left lumpectomy for DCIS and a right lumpectomy and sentinel node biopsy on 07/26/2020. She had 2 lymph nodes removed with 1 being positive for cancer on the right side. It is ER/PR positive. Left shoulder rotator cuff repair 06/2013. Last radiation on 01/15/21.  Seroma that was drained x 2-3.    Patient Stated Goals Reduce the numbness/pain, be able to sleep on right side,improve shoulderROM, decrease tightness    Currently in Pain? Yes    Pain Score 4     Pain Location Shoulder   and axilla   Pain Orientation Right    Pain Descriptors / Indicators Sore    Pain Type Chronic pain    Multiple Pain Sites No                               [facility] Adult PT Treatment/Exercise - 05/23/21 0001       Shoulder Exercises: Supine   Other Supine Exercises AROM bilateral flexion, scaption, horizontal abd x 6      Manual Therapy   Manual Therapy Soft tissue mobilization;Manual Lymphatic Drainage (MLD);Passive ROM;Neural Stretch    Edema Management tried foam;can't tell if it helps    Soft tissue mobilization in supine to R pec and then to L sidelying to R lats, serratus, upper traps and with  cocoa butter to periscapular area    Myofascial Release To Rt axilla during P/ROM    Manual Lymphatic Drainage (MLD) in supine: short neck, 5 diaphragmatic breaths, left axillary lymph nodes. right inguinal nodes, establishment of axillo inguinal anastomosis and inter axillary anastomosis then R breast moving fluid towards pathways  then retracing all steps    Neural Stretch PROM to right shoulder with gentle NTS                          PT Long Term Goals - 05/08/21 1708       PT LONG TERM GOAL #1   Title Pt will have decreased axillary pain/tightness by atleast 50%    Time 6    Period Weeks    Status New    Target Date 06/19/21      PT LONG TERM GOAL #2   Title Pt will be independent in self MLD of right breast to decrease swelling    Time 6    Period Weeks    Status New    Target Date 06/19/21      PT LONG TERM GOAL #3   Title Pt will be able to stretch right shoulder/axillary region with minimal numbness    Time 6    Period Weeks    Status New    Target Date 06/19/21      PT LONG TERM GOAL #4   Title pt will be able to lie on right side without right arm numbness    Time 6    Period Weeks    Status New    Target Date 06/19/21                   Plan - 05/23/21 0954     Clinical Impression Statement Pt is very sore all over due to the exemestane. Continued Soft tissue mobilization, PROM, AROM, and MLD techniques.  Multiple areas of tightness with nodules noted in pectoralis and lats today with tenderness.  Pt felt much better after treatment and notes numbness is 50% better overall.  She is compliant with her HEP    Personal Factors and Comorbidities Age;Time since onset of injury/illness/exacerbation;Comorbidity 3+    Comorbidities Bilateral breast CA with Right SLNB, radiation,chemo, FMS,hypothyroidism, left RTC repair, cervical problems mainly on left    Stability/Clinical Decision Making Stable/Uncomplicated  Rehab Potential Good    PT  Frequency 2x / week    PT Duration 6 weeks    PT Treatment/Interventions ADLs/Self Care Home Management;Therapeutic exercise;Patient/family education;Neuromuscular re-education;Manual techniques;Manual lymph drainage;Passive range of motion;Taping    PT Next Visit Plan STM to right pectorals especially axillary border and lats, MFR techniques, PROM, progress to right breast MLD, review self MLD - ask how foam worked    PT [place] MLD daily    Consulted and Agree with Plan of Care Patient             Patient will benefit from skilled therapeutic intervention in order to improve the following deficits and impairments:  Decreased activity tolerance, Decreased range of motion, Pain, Impaired sensation, Increased edema, Postural dysfunction, Decreased strength  Visit Diagnosis: Aftercare following surgery for neoplasm  Localized edema  Abnormal posture  Stiffness of right shoulder, not elsewhere classified  Muscle weakness (generalized)     Problem List Patient Active Problem List   Diagnosis Date Noted   Malignant neoplasm of upper-outer quadrant of right breast in female, estrogen receptor positive ([place]) 06/25/2020   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   Hepatic steatosis 11/27/2014   Hx of adenomatous colonic polyps 11/27/2014   Visit for preventive health examination 03/28/2014   Hyperlipidemia 03/28/2014   Left rotator cuff tear 07/07/2013   Tear of left rotator cuff 07/07/2013   Rhinitis 06/01/2013   Chest wall deformity 04/12/2012   Atrial septal aneurysm / if pfo  echo 6 13  10/23/2011   OSA (obstructive sleep apnea) 11/27/2010   Dyslipidemia 11/01/2010   Flushing 08/29/2010   Labile hypertension 08/29/2010   MUSCLE CRAMPS, FOOT 06/10/2010   OTHER SLEEP DISTURBANCES 06/10/2010   ANXIETY, SITUATIONAL 05/08/2010   VERTIGO, POSITIONAL 03/15/2010   VITAMIN D DEFICIENCY 02/18/2010   OBESITY 02/18/2010   ALLERGIC RHINITIS  02/18/2010   PLANTAR FASCIITIS 02/18/2010   TWITCHING 06/11/2009   NUMBNESS, HAND 06/11/2009   CERVICAL STRAIN, ACUTE 06/11/2009   OTHER MALAISE AND FATIGUE 07/09/2007   HYPERTENSION 01/28/2007   Hypothyroidism 11/11/2006   COMMON MIGRAINE 11/11/2006   GERD 11/11/2006   FIBROMYALGIA 11/11/2006    Claris Pong, PT 05/23/2021, 10:01 AM  [facility] @ [address] [place], [place], [address] Phone: [phone]   Fax:  [phone]  Name: [name] MRN: [id number] Date of Birth: [birth year]

## 2021-05-28 ENCOUNTER — Ambulatory Visit (INDEPENDENT_AMBULATORY_CARE_PROVIDER_SITE_OTHER): Payer: Medicare HMO

## 2021-05-28 ENCOUNTER — Encounter: Payer: Self-pay | Admitting: Internal Medicine

## 2021-05-28 VITALS — Ht 60.0 in | Wt 207.0 lb

## 2021-05-28 DIAGNOSIS — Z0000 Encounter for general adult medical examination without abnormal findings: Secondary | ICD-10-CM | POA: Diagnosis not present

## 2021-05-28 NOTE — Patient Instructions (Addendum)
[name] , Thank you for taking time to come for your Medicare Wellness Visit. I appreciate your ongoing commitment to your health goals. Please review the following plan we discussed and let me know if I can assist you in the future.   These are the goals we discussed:  Goals      Exercise 150 min/wk Moderate Activity     Patient Stated     I want to make scrapbooks for my entire family.     Patient Stated     I want to be more mindful and keep stress down.        This is a list of the screening recommended for you and due dates:  Health Maintenance  Topic Date Due   COVID-19 Vaccine (1) 06/13/2021*   Flu Shot  08/09/2021*   Zoster (Shingles) Vaccine (1 of 2) 08/26/2021*   Pneumonia Vaccine (1 - PCV) 05/28/2022*   Colon Cancer Screening  01/26/2022   Mammogram  06/08/2022   Tetanus Vaccine  03/28/2024   DEXA scan (bone density measurement)  Completed   Hepatitis C Screening: USPSTF Recommendation to screen - Ages 21-79 yo.  Completed   HPV Vaccine  Aged Out  *Topic was postponed. The date shown is not the original due date.   Advanced directives: No   Conditions/risks identified: None  Next appointment: Follow up in one year for your annual wellness visit    Preventive Care 65 Years and Older, Female Preventive care refers to lifestyle choices and visits with your health care provider that can promote health and wellness. What does preventive care include? A yearly physical exam. This is also called an annual well check. Dental exams once or twice a year. Routine eye exams. Ask your health care provider how often you should have your eyes checked. Personal lifestyle choices, including: Daily care of your teeth and gums. Regular physical activity. Eating a healthy diet. Avoiding tobacco and drug use. Limiting alcohol use. Practicing safe sex. Taking low-dose aspirin every day. Taking vitamin and mineral supplements as recommended by your health care  provider. What happens during an annual well check? The services and screenings done by your health care provider during your annual well check will depend on your age, overall health, lifestyle risk factors, and family history of disease. Counseling  Your health care provider may ask you questions about your: Alcohol use. Tobacco use. Drug use. Emotional well-being. Home and relationship well-being. Sexual activity. Eating habits. History of falls. Memory and ability to understand (cognition). Work and work Statistician. Reproductive health. Screening  You may have the following tests or measurements: Height, weight, and BMI. Blood pressure. Lipid and cholesterol levels. These may be checked every 5 years, or more frequently if you are over 56 years old. Skin check. Lung cancer screening. You may have this screening every year starting at age 45 if you have a 30-pack-year history of smoking and currently smoke or have quit within the past 15 years. Fecal occult blood test (FOBT) of the stool. You may have this test every year starting at age 82. Flexible sigmoidoscopy or colonoscopy. You may have a sigmoidoscopy every 5 years or a colonoscopy every 10 years starting at age 54. Hepatitis C blood test. Hepatitis B blood test. Sexually transmitted disease (STD) testing. Diabetes screening. This is done by checking your blood sugar (glucose) after you have not eaten for a while (fasting). You may have this done every 1-3 years. Bone density scan. This is done to  screen for osteoporosis. You may have this done starting at age 80. Mammogram. This may be done every 1-2 years. Talk to your health care provider about how often you should have regular mammograms. Talk with your health care provider about your test results, treatment options, and if necessary, the need for more tests. Vaccines  Your health care provider may recommend certain vaccines, such as: Influenza vaccine. This is  recommended every year. Tetanus, diphtheria, and acellular pertussis (Tdap, Td) vaccine. You may need a Td booster every 10 years. Zoster vaccine. You may need this after age 15. Pneumococcal 13-valent conjugate (PCV13) vaccine. One dose is recommended after age 31. Pneumococcal polysaccharide (PPSV23) vaccine. One dose is recommended after age 31. Talk to your health care provider about which screenings and vaccines you need and how often you need them. This information is not intended to replace advice given to you by your health care provider. Make sure you discuss any questions you have with your health care provider. Document Released: 05/25/2015 Document Revised: 01/16/2016 Document Reviewed: 02/27/2015 Elsevier Interactive Patient Education  2017 [place] Prevention in the Home Falls can cause injuries. They can happen to people of all ages. There are many things you can do to make your home safe and to help prevent falls. What can I do on the outside of my home? Regularly fix the edges of walkways and driveways and fix any cracks. Remove anything that might make you trip as you walk through a door, such as a raised step or threshold. Trim any bushes or trees on the path to your home. Use bright outdoor lighting. Clear any walking paths of anything that might make someone trip, such as rocks or tools. Regularly check to see if handrails are loose or broken. Make sure that both sides of any steps have handrails. Any raised decks and porches should have guardrails on the edges. Have any leaves, snow, or ice cleared regularly. Use sand or salt on walking paths during winter. Clean up any spills in your garage right away. This includes oil or grease spills. What can I do in the bathroom? Use night lights. Install grab bars by the toilet and in the tub and shower. Do not use towel bars as grab bars. Use non-skid mats or decals in the tub or shower. If you need to sit down in  the shower, use a plastic, non-slip stool. Keep the floor dry. Clean up any water that spills on the floor as soon as it happens. Remove soap buildup in the tub or shower regularly. Attach bath mats securely with double-sided non-slip rug tape. Do not have throw rugs and other things on the floor that can make you trip. What can I do in the bedroom? Use night lights. Make sure that you have a light by your bed that is easy to reach. Do not use any sheets or blankets that are too big for your bed. They should not hang down onto the floor. Have a firm chair that has side arms. You can use this for support while you get dressed. Do not have throw rugs and other things on the floor that can make you trip. What can I do in the kitchen? Clean up any spills right away. Avoid walking on wet floors. Keep items that you use a lot in easy-to-reach places. If you need to reach something above you, use a strong step stool that has a grab bar. Keep electrical cords out of the way.  Do not use floor polish or wax that makes floors slippery. If you must use wax, use non-skid floor wax. Do not have throw rugs and other things on the floor that can make you trip. What can I do with my stairs? Do not leave any items on the stairs. Make sure that there are handrails on both sides of the stairs and use them. Fix handrails that are broken or loose. Make sure that handrails are as long as the stairways. Check any carpeting to make sure that it is firmly attached to the stairs. Fix any carpet that is loose or worn. Avoid having throw rugs at the top or bottom of the stairs. If you do have throw rugs, attach them to the floor with carpet tape. Make sure that you have a light switch at the top of the stairs and the bottom of the stairs. If you do not have them, ask someone to add them for you. What else can I do to help prevent falls? Wear shoes that: Do not have high heels. Have rubber bottoms. Are comfortable  and fit you well. Are closed at the toe. Do not wear sandals. If you use a stepladder: Make sure that it is fully opened. Do not climb a closed stepladder. Make sure that both sides of the stepladder are locked into place. Ask someone to hold it for you, if possible. Clearly mark and make sure that you can see: Any grab bars or handrails. First and last steps. Where the edge of each step is. Use tools that help you move around (mobility aids) if they are needed. These include: Canes. Walkers. Scooters. Crutches. Turn on the lights when you go into a dark area. Replace any light bulbs as soon as they burn out. Set up your furniture so you have a clear path. Avoid moving your furniture around. If any of your floors are uneven, fix them. If there are any pets around you, be aware of where they are. Review your medicines with your doctor. Some medicines can make you feel dizzy. This can increase your chance of falling. Ask your doctor what other things that you can do to help prevent falls. This information is not intended to replace advice given to you by your health care provider. Make sure you discuss any questions you have with your health care provider. Document Released: 02/22/2009 Document Revised: 10/04/2015 Document Reviewed: 06/02/2014 Elsevier Interactive Patient Education  2017 [employer].

## 2021-05-28 NOTE — Progress Notes (Signed)
Subjective:   [name] is a 66 y.o. female who presents for Medicare Annual (Subsequent) preventive examination.  Review of Systems    No ROS Cardiac Risk Factors include: advanced age (>105men, >27 women)    Objective:    Today's Vitals   05/28/21 1117  Weight: 207 lb (93.9 kg)  Height: 5' (1.524 m)   Body mass index is 40.43 kg/m.  Advanced Directives 05/28/2021 05/08/2021 03/06/2021 12/13/2020 11/14/2020 09/25/2020 09/21/2020  Does Patient Have a Medical Advance Directive? No No No No No No No  Would patient like information on creating a medical advance directive? No - Patient declined No - Patient declined No - Patient declined No - Patient declined No - Patient declined No - Patient declined No - Patient declined  Pre-existing out of facility DNR order (yellow form or pink MOST form) - - - - - - -    Current Medications (verified) Outpatient Encounter Medications as of 05/28/2021  Medication Sig   Ascorbic Acid (VITAMIN C) 1000 MG tablet Take 1,000 mg by mouth daily.   b complex vitamins capsule Take 1 capsule by mouth daily.   Bioflavonoid Products (QUERCETIN COMPLEX IMMUNE PO) Take 1 capsule by mouth daily.   Cholecalciferol (VITAMIN D) 50 MCG (2000 UT) tablet Take 4,000 Units by mouth daily.   exemestane (AROMASIN) 25 MG tablet Take 1 tablet (25 mg total) by mouth daily after breakfast.   hydrochlorothiazide (HYDRODIURIL) 25 MG tablet TAKE 1 TABLET BY MOUTH EVERY DAY   hydroxychloroquine (PLAQUENIL) 200 MG tablet Take 200 mg by mouth 2 (two) times a week.   MAGNESIUM CARBONATE PO Take 1 tablet by mouth daily.   melatonin 5 MG TABS Take 5 mg by mouth at bedtime.   Menaquinone-7 (VITAMIN K2) 100 MCG CAPS Take 100 mcg by mouth daily.   metoprolol succinate (TOPROL XL) 25 MG 24 hr tablet TAKE ONCE A DAY AS DIRECTED (Patient taking differently: Take by mouth daily as needed (Blood pressure). TAKE ONCE A DAY AS DIRECTED)   Multiple Vitamin (MULTIVITAMIN) tablet Take 1  tablet by mouth daily.   NON FORMULARY Take 48.6 mg by mouth See admin instructions. Thyroid desiccated (procine) sr 2 capsules 5 days a week, 3 capsules 2 days per week (Tues and Fri)   OLIVE LEAF EXTRACT PO Take 1 capsule by mouth daily.   OVER THE COUNTER MEDICATION Take 2 capsules by mouth in the morning and at bedtime. Ifnla Med   OVER THE COUNTER MEDICATION Take 1 capsule by mouth daily. Essential Pro   potassium chloride SA (KLOR-CON M) 20 MEQ tablet TAKE ONE TABLET BY MOUTH ONE TIME DAILY   Probiotic Product (PROBIOTIC PO) Take 1 capsule by mouth daily.   UBIQUINOL PO Take by mouth.   VITAMIN E PO Take 23 mg by mouth daily.   ZINC GLUCONATE PO Take 10 mg by mouth daily.   No facility-administered encounter medications on file as of 05/28/2021.    Allergies (verified) Penicillins, Taxotere [docetaxel], Cefdinir, Norvasc [amlodipine besylate], Cetirizine, Irbesartan, Molds & smuts, Pegfilgrastim, and Tylenol [acetaminophen]   History: Past Medical History:  Diagnosis Date   Anal fissure    Atrial septal aneurysm / if pfo  echo 6 13  10/23/2011   Breast cancer (HCC)    Cataract    both eyes   Depression    Fatty liver    "pre fatty liver"   Fibromyalgia    Headache(784.0)    hx of migraines when younger  Heart palpitations    hx with normal holter event monitoring   Hx: UTI (urinary tract infection)    Hyperlipidemia    Hypertension    Hypothyroidism    Obesity    Pneumonia 1972   hx of   Polyp of colon    Serrated adenoma of colon 08/2012   Skin cancer    basal, squamous cell   Sleep apnea    uses cpap   Past Surgical History:  Procedure Laterality Date   BREAST LUMPECTOMY WITH RADIOACTIVE SEED AND SENTINEL LYMPH NODE BIOPSY Right 07/26/2020   Procedure: RIGHT BREAST LUMPECTOMY WITH RADIOACTIVE SEED AND SENTINEL LYMPH NODE BIOPSY;  Surgeon: Jovita Kussmaul, MD;  Location: [facility];  Service: General;  Laterality: Right;   BREAST LUMPECTOMY WITH RADIOACTIVE SEED  LOCALIZATION Left 07/26/2020   Procedure: LEFT BREAST LUMPECTOMY X 2  WITH RADIOACTIVE SEED LOCALIZATION;  Surgeon: Jovita Kussmaul, MD;  Location: [facility];  Service: General;  Laterality: Left;   COLONOSCOPY     DILATION AND CURETTAGE OF [place] ARTHROSCOPY     both in past   POLYPECTOMY     [place] Left 07/07/2013   Procedure: LEFT SHOULDER MINI OPEN SUBACROMIAL DECOMPRESSION ROTATOR CUFF REPAIR AND POSSIBLE PATCH GRAFT ;  Surgeon: Johnn Hai, MD;  Location: [facility];  Service: Orthopedics;  Laterality: Left;  with interscaline block   SKIN CANCER EXCISION Bilateral    arm, legs, and chest   TONSILLECTOMY     Family History  Problem Relation Age of Onset   Hypertension Mother        low borderline   Osteoporosis Mother    Hypertension Father    Liver disease Father        amyloid deceased   Hyperlipidemia Father    Melanoma Sister    Cancer Sister        melanoma    Juvenile Diabetes Daughter    ADD / ADHD Child    Hyperlipidemia Other        Maternal grandmother   Colon cancer Neg Hx    Stomach cancer Neg Hx    Colon polyps Neg Hx    Esophageal cancer Neg Hx    Rectal cancer Neg Hx    Social History   Socioeconomic History   Marital status: Married    Spouse name: Not on file   Number of children: 6   Years of education: Not on file   Highest education level: Not on file  Occupational History   Occupation: TEACHER ASST    Employer: [employer]  Tobacco Use   Smoking status: Never   Smokeless tobacco: Never  Vaping Use   Vaping Use: Never used  Substance and Sexual Activity   Alcohol use: Yes    Comment: rare   Drug use: No   Sexual activity: Not on file  Other Topics Concern   Not on file  Social History Narrative   Teachers Aide [employer]  Not workking since last February . 15 left shoulder surgery    Married   Special educ   HH of 4   3 outside dogs, 8 goats, 40  chickens and quail. [place]   Job stresses   Ex husband passes away   Not working after injur at school  Shoulder neck  Social Determinants of Health   Financial Resource Strain: Low Risk    Difficulty of Paying Living Expenses: Not hard at all  Food Insecurity: No Food Insecurity   Worried About Charity fundraiser in the Last Year: Never true   [place] in the Last Year: Never true  Transportation Needs: No Transportation Needs   Lack of Transportation (Medical): No   Lack of Transportation (Non-Medical): No  Physical Activity: Insufficiently Active   Days of Exercise per Week: 5 days   Minutes of Exercise per Session: 20 min  Stress: No Stress Concern Present   Feeling of Stress : Not at all  Social Connections: Socially Integrated   Frequency of Communication with Friends and Family: More than three times a week   Frequency of Social Gatherings with Friends and Family: More than three times a week   Attends Religious Services: More than 4 times per year   Active Member of [employer] or Organizations: Yes   Attends Music therapist: More than 4 times per year   Marital Status: Married    Clinical Intake:   Diabetic? No   Activities of Daily Living In your present state of health, do you have any difficulty performing the following activities: 05/28/2021  Hearing? N  Vision? N  Difficulty concentrating or making decisions? N  Walking or climbing stairs? N  Dressing or bathing? N  Doing errands, shopping? N  Preparing Food and eating ? N  Using the Toilet? N  In the past six months, have you accidently leaked urine? Y  Comment Wears pads  Do you have problems with loss of bowel control? N  Managing your Medications? N  Managing your Finances? N  Housekeeping or managing your Housekeeping? N  Some recent data might be hidden    Patient Care Team: Panosh, Standley Brooking, MD as PCP - General Arvella Nigh, MD as Consulting  Physician (Obstetrics and Gynecology) Dr Peggye Form, MD as Consulting Physician (Orthopedic Surgery) Ladene Artist, MD as Consulting Physician (Gastroenterology) Melina Schools, MD as Consulting Physician (Orthopedic Surgery) Rockwell Germany, RN as Oncology Nurse Navigator Mauro Kaufmann, RN as Oncology Nurse Navigator Jovita Kussmaul, MD as Consulting Physician (General Surgery) Truitt Merle, MD as Consulting Physician (Hematology) Eppie Gibson, MD as Attending Physician (Radiation Oncology) Jari Pigg, MD as Consulting Physician (Dermatology) Serita Kyle, MD as Referring Physician (Cardiology) Alla Feeling, NP as Nurse Practitioner (Nurse Practitioner)  Indicate any recent Medical Services you may have received from other than [facility] providers in the past year (date may be approximate).     Assessment:   This is a routine wellness examination for Solyana.  Virtual Visit via Telephone Note  I connected with  OTIS BURRESS on 05/28/21 at 11:15 AM EST by telephone and verified that I am speaking with the correct person using two identifiers.  Location: Patient: Home Provider: Office Persons participating in the virtual visit: patient/Nurse Health Advisor   I discussed the limitations, risks, security and privacy concerns of performing an evaluation and management service by telephone and the availability of in person appointments. The patient expressed understanding and agreed to proceed.  Interactive audio and video telecommunications were attempted between this nurse and patient, however failed, due to patient having technical difficulties OR patient did not have access to video capability.  We continued and completed visit with audio only.  Some vital signs may be absent or patient reported.   Rise Paganini  Fidel Levy, LPN   Hearing/Vision screen Hearing Screening - Comments:: No difficulty hearing Vision Screening - Comments:: Wears glasses. Followed by Dr  Joya San  Dietary issues and exercise activities discussed: Current Exercise Habits: Home exercise routine, Type of exercise: stretching, Time (Minutes): 20, Frequency (Times/Week): 5, Weekly Exercise (Minutes/Week): 100, Intensity: Mild   Goals Addressed             This Visit's Progress    Patient Stated       I want to be more mindful and keep stress down.       Depression Screen PHQ 2/9 Scores 05/28/2021 06/11/2020 07/30/2016 09/24/2015 08/20/2015  PHQ - 2 Score 0 0 0 0 0  PHQ- 9 Score - 0 - - -    Fall Risk Fall Risk  05/28/2021 06/11/2020 07/30/2016 09/24/2015  Falls in the past year? 0 0 No No  Number falls in past yr: 0 0 - -  Injury with Fall? 0 0 - -  Risk for fall due to : - No Fall Risks - -  Follow up - Falls evaluation completed;Falls prevention discussed - -    FALL RISK PREVENTION PERTAINING TO THE HOME:  Any stairs in or around the home? Yes  If so, are there any without handrails? No  Home free of loose throw rugs in walkways, pet beds, electrical cords, etc? Yes  Adequate lighting in your home to reduce risk of falls? Yes   ASSISTIVE DEVICES UTILIZED TO PREVENT FALLS:  Life alert? No  Use of a cane, walker or w/c? No Grab bars in the bathroom? No  Shower chair or bench in shower? No  Elevated toilet seat or a handicapped toilet? No   TIMED UP AND GO:  Was the test performed? No . Audio Visit  Cognitive Function:   Immunizations Immunization History  Administered Date(s) Administered   Hep A / Hep B 08/02/2014, 09/20/2014, 06/20/2015   Influenza Split 03/11/2011   Influenza Whole 02/18/2010   Td 01/10/1998   Tdap 03/28/2014   Flu Vaccine status: Declined, Education has been provided regarding the importance of this vaccine but patient still declined. Advised may receive this vaccine at local pharmacy or Health Dept. Aware to provide a copy of the vaccination record if obtained from local pharmacy or Health Dept. Verbalized acceptance and  understanding.  Pneumococcal vaccine status: Declined,  Education has been provided regarding the importance of this vaccine but patient still declined. Advised may receive this vaccine at local pharmacy or Health Dept. Aware to provide a copy of the vaccination record if obtained from local pharmacy or Health Dept. Verbalized acceptance and understanding.   Covid-19 vaccine status: Declined, Education has been provided regarding the importance of this vaccine but patient still declined. Advised may receive this vaccine at local pharmacy or Health Dept.or vaccine clinic. Aware to provide a copy of the vaccination record if obtained from local pharmacy or Health Dept. Verbalized acceptance and understanding.  Qualifies for Shingles Vaccine? Yes   Zostavax completed No   Shingrix Completed?: No.    Education has been provided regarding the importance of this vaccine. Patient has been advised to call insurance company to determine out of pocket expense if they have not yet received this vaccine. Advised may also receive vaccine at local pharmacy or Health Dept. Verbalized acceptance and understanding.  Screening Tests Health Maintenance  Topic Date Due   COVID-19 Vaccine (1) 06/13/2021 (Originally 07/18/1956)   INFLUENZA VACCINE  08/09/2021 (Originally 12/10/2020)   Zoster  Vaccines- Shingrix (1 of 2) 08/26/2021 (Originally 01/17/1975)   Pneumonia Vaccine 65+ Years old (1 - PCV) 05/28/2022 (Originally 01/18/1962)   COLONOSCOPY (Pts 45-30yrs Insurance coverage will need to be confirmed)  01/26/2022   MAMMOGRAM  06/08/2022   TETANUS/TDAP  03/28/2024   DEXA SCAN  Completed   Hepatitis C Screening  Completed   HPV VACCINES  Aged Out    Health Maintenance  There are no preventive care reminders to display for this patient.  Bone Density status: Completed 04/10/21. Results reflect: Bone density results: OSTEOPENIA. Repeat every 2 years.  Additional Screening:  Vision Screening: Recommended annual  ophthalmology exams for early detection of glaucoma and other disorders of the eye. Is the patient up to date with their annual eye exam?  Yes  Who is the provider or what is the name of the office in which the patient attends annual eye exams? Followed by Dr Joya San  Dental Screening: Recommended annual dental exams for proper oral hygiene  Community Resource Referral / Chronic Care Management:  CRR required this visit?  No   CCM required this visit?  No     Plan:     I have personally reviewed and noted the following in the patients chart:   Medical and social history Use of alcohol, tobacco or illicit drugs  Current medications and supplements including opioid prescriptions. Patient currently not taking opioids Functional ability and status Nutritional status Physical activity Advanced directives List of other physicians Hospitalizations, surgeries, and ER visits in previous 12 months Vitals Screenings to include cognitive, depression, and falls Referrals and appointments  In addition, I have reviewed and discussed with patient certain preventive protocols, quality metrics, and best practice recommendations. A written personalized care plan for preventive services as well as general preventive health recommendations were provided to patient.     Criselda Peaches, LPN   0/30/0923

## 2021-05-29 ENCOUNTER — Telehealth: Payer: Self-pay | Admitting: Internal Medicine

## 2021-05-29 NOTE — Telephone Encounter (Signed)
Patient calling in with respiratory symptoms: Shortness of breath, chest pain, palpitations or other red words send to Triage  Does the patient have a fever over 100, cough, congestion, sore throat, runny nose, lost of taste/smell (please list symptoms that patient has)?head congestion and cough  What date did symptoms start?05-24-2021 (If over 5 days ago, pt may be scheduled for in person visit)  Have you tested for Covid in the last 5 days? No   If yes, was it positive []  OR negative [] ? If positive in the last 5 days, please schedule virtual visit now. If negative, schedule for an in person OV with the next available provider if PCP has no openings. Please also let patient know they will be tested again (follow the script below)  "you will have to arrive 86mins prior to your appt time to be Covid tested. Please park in back of [facility] & call [phone] to let the staff know you have arrived. A staff member will meet you at your car to do a rapid covid test. Once the test has resulted you will be notified by phone of your results to determine if appt will remain an in person visit or be converted to a virtual/phone visit. If you arrive less than 36mins before your appt time, your visit will be automatically converted to virtual & any recommended testing will happen AFTER the visit." Pt has virtual with dr Regis Bill on 05-30-2021 at 10 am [place]  If no availability for virtual visit in office,  please schedule another [place] office  If no availability at another [place] office, please instruct patient that they can schedule an evisit or virtual visit through their mychart account. Visits up to 8pm  patients can be seen in office 5 days after positive COVID test

## 2021-05-30 ENCOUNTER — Encounter: Payer: Self-pay | Admitting: Internal Medicine

## 2021-05-30 ENCOUNTER — Telehealth (INDEPENDENT_AMBULATORY_CARE_PROVIDER_SITE_OTHER): Payer: Medicare HMO | Admitting: Internal Medicine

## 2021-05-30 VITALS — BP 116/72 | HR 82 | Temp 97.6°F

## 2021-05-30 DIAGNOSIS — Z20822 Contact with and (suspected) exposure to covid-19: Secondary | ICD-10-CM | POA: Diagnosis not present

## 2021-05-30 DIAGNOSIS — J069 Acute upper respiratory infection, unspecified: Secondary | ICD-10-CM | POA: Diagnosis not present

## 2021-05-30 MED ORDER — BENZONATATE 100 MG PO CAPS: 100.0000 mg | ORAL_CAPSULE | Freq: Three times a day (TID) | ORAL | 0 refills | Status: DC | PRN

## 2021-05-30 NOTE — Progress Notes (Signed)
Virtual Visit via Video Note  I connected with [name] on 05/30/21 at 10:00 AM EST by a video enabled telemedicine application and verified that I am speaking with the correct person using two identifiers. Location patient: home Location provider:home office Persons participating in the virtual visit: patient, provider  [place] national recommendations  regarding COVID 19 pandemic   video visit is advised over in office visit for this patient.  Patient aware  of the limitations of evaluation and management by telemedicine and  availability of in person appointments. and agreed to proceed.   HPI: [name] presents for video visit for 6 days of upper respiratory infection like a bad cold.  She is having nasal drainage cough but no shortness of breath fever or localized significant pain. Has used some nasal saline and Nettie pot washes.  Cough sometimes a spasm. Onset versus about 13 January.  She attended a birthday party on January 7 where 1 or 2 people tested positive for COVID soon after.  She has not tested for COVID does not have the test. No vaccination. States does not feel that ill and no fever body aches.   No cp sob  ROS: See pertinent positives and negatives per HPI.  Past Medical History:  Diagnosis Date   Anal fissure    Atrial septal aneurysm / if pfo  echo 6 13  10/23/2011   Breast cancer ([place])    Cataract    both eyes   Depression    Fatty liver    "pre fatty liver"   Fibromyalgia    Headache(784.0)    hx of migraines when younger   Heart palpitations    hx with normal holter event monitoring   Hx: UTI (urinary tract infection)    Hyperlipidemia    Hypertension    Hypothyroidism    Obesity    Pneumonia 1972   hx of   Polyp of colon    Serrated adenoma of colon 08/2012   Skin cancer    basal, squamous cell   Sleep apnea    uses cpap    Past Surgical History:  Procedure Laterality Date   BREAST LUMPECTOMY WITH RADIOACTIVE SEED AND  SENTINEL LYMPH NODE BIOPSY Right 07/26/2020   Procedure: RIGHT BREAST LUMPECTOMY WITH RADIOACTIVE SEED AND SENTINEL LYMPH NODE BIOPSY;  Surgeon: Jovita Kussmaul, MD;  Location: [facility];  Service: General;  Laterality: Right;   BREAST LUMPECTOMY WITH RADIOACTIVE SEED LOCALIZATION Left 07/26/2020   Procedure: LEFT BREAST LUMPECTOMY X 2  WITH RADIOACTIVE SEED LOCALIZATION;  Surgeon: Jovita Kussmaul, MD;  Location: [facility];  Service: General;  Laterality: Left;   COLONOSCOPY     [place] ARTHROSCOPY     both in past   POLYPECTOMY     [place] Left 07/07/2013   Procedure: LEFT SHOULDER MINI OPEN SUBACROMIAL DECOMPRESSION ROTATOR CUFF REPAIR AND POSSIBLE PATCH GRAFT ;  Surgeon: Johnn Hai, MD;  Location: [facility];  Service: Orthopedics;  Laterality: Left;  with interscaline block   SKIN CANCER EXCISION Bilateral    arm, legs, and chest   TONSILLECTOMY      Family History  Problem Relation Age of Onset   Hypertension Mother        low borderline   Osteoporosis Mother    Hypertension Father    Liver disease Father  amyloid deceased   Hyperlipidemia Father    Melanoma Sister    Cancer Sister        melanoma    Juvenile Diabetes Daughter    ADD / ADHD Child    Hyperlipidemia Other        Maternal grandmother   Colon cancer Neg Hx    Stomach cancer Neg Hx    Colon polyps Neg Hx    Esophageal cancer Neg Hx    Rectal cancer Neg Hx     Social History   Tobacco Use   Smoking status: Never   Smokeless tobacco: Never  Vaping Use   Vaping Use: Never used  Substance Use Topics   Alcohol use: Yes    Comment: rare   Drug use: No      Current Outpatient Medications:    Ascorbic Acid (VITAMIN C) 1000 MG tablet, Take 1,000 mg by mouth daily., Disp: , Rfl:    b complex vitamins capsule, Take 1 capsule by mouth daily., Disp: , Rfl:    benzonatate (TESSALON PERLES) 100 MG capsule, Take 1-2  capsules (100-200 mg total) by mouth 3 (three) times daily as needed for cough (max dose 600 mg per day)., Disp: 30 capsule, Rfl: 0   Bioflavonoid Products (QUERCETIN COMPLEX IMMUNE PO), Take 1 capsule by mouth daily., Disp: , Rfl:    Cholecalciferol (VITAMIN D) 50 MCG (2000 UT) tablet, Take 4,000 Units by mouth daily., Disp: , Rfl:    exemestane (AROMASIN) 25 MG tablet, Take 1 tablet (25 mg total) by mouth daily after breakfast., Disp: 90 tablet, Rfl: 0   hydrochlorothiazide (HYDRODIURIL) 25 MG tablet, TAKE 1 TABLET BY MOUTH EVERY DAY, Disp: 90 tablet, Rfl: 0   MAGNESIUM CARBONATE PO, Take 1 tablet by mouth daily., Disp: , Rfl:    melatonin 5 MG TABS, Take 5 mg by mouth at bedtime., Disp: , Rfl:    Menaquinone-7 (VITAMIN K2) 100 MCG CAPS, Take 100 mcg by mouth daily., Disp: , Rfl:    metoprolol succinate (TOPROL XL) 25 MG 24 hr tablet, TAKE ONCE A DAY AS DIRECTED (Patient taking differently: Take by mouth daily as needed (Blood pressure). TAKE ONCE A DAY AS DIRECTED), Disp: 30 tablet, Rfl: 1   Multiple Vitamin (MULTIVITAMIN) tablet, Take 1 tablet by mouth daily., Disp: , Rfl:    NON FORMULARY, Take 48.6 mg by mouth See admin instructions. Thyroid desiccated (procine) sr 2 capsules 5 days a week, 3 capsules 2 days per week (Tues and Fri), Disp: , Rfl:    OLIVE LEAF EXTRACT PO, Take 1 capsule by mouth daily., Disp: , Rfl:    OVER THE COUNTER MEDICATION, Take 2 capsules by mouth in the morning and at bedtime. Ifnla Med, Disp: , Rfl:    OVER THE COUNTER MEDICATION, Take 1 capsule by mouth daily. Essential Pro, Disp: , Rfl:    potassium chloride SA (KLOR-CON M) 20 MEQ tablet, TAKE ONE TABLET BY MOUTH ONE TIME DAILY, Disp: 90 tablet, Rfl: 3   Probiotic Product (PROBIOTIC PO), Take 1 capsule by mouth daily., Disp: , Rfl:    UBIQUINOL PO, Take by mouth., Disp: , Rfl:    VITAMIN E PO, Take 23 mg by mouth daily., Disp: , Rfl:    ZINC GLUCONATE PO, Take 10 mg by mouth daily., Disp: , Rfl:     hydroxychloroquine (PLAQUENIL) 200 MG tablet, Take 200 mg by mouth 2 (two) times a week., Disp: , Rfl:   EXAM: BP Readings from Last 3 Encounters:  05/30/21 116/72  04/11/21 (!) 160/79  04/08/21 (!) 150/80    VITALS per patient if applicable:  GENERAL: alert, oriented, appears well and in no acute distress congested  ocass cough   chest   HEENT: atraumatic, conjunttiva clear, no obvious abnormalities on inspection of external nose and ears no facial edema   NECK: normal movements of the head and neck  LUNGS: on inspection no signs of respiratory distress, breathing rate appears normal, no obvious gross SOB, gasping or wheezing  CV: no obvious cyanosis  MS: moves all visible extremities without noticeable abnormality  PSYCH/NEURO: pleasant and cooperative, no obvious depression or anxiety, speech and thought processing grossly intact Lab Results  Component Value Date   WBC 7.6 04/11/2021   HGB 13.1 04/11/2021   HCT 40.3 04/11/2021   PLT 200 04/11/2021   GLUCOSE 88 04/11/2021   CHOL 226 (A) 06/05/2020   TRIG 56 08/05/2019   HDL 57 06/05/2020   LDLDIRECT 165.2 12/27/2012   LDLCALC 156 06/05/2020   ALT 33 04/11/2021   AST 26 04/11/2021   NA 141 04/11/2021   K 3.4 (L) 04/11/2021   CL 103 04/11/2021   CREATININE 0.78 04/11/2021   BUN 18 04/11/2021   CO2 28 04/11/2021   TSH 0.674 04/11/2021   HGBA1C 5.2 06/01/2013    ASSESSMENT AND PLAN:  Discussed the following assessment and plan:    ICD-10-CM   1. Suspected COVID-19 virus infection  Z20.822     2. Acute upper respiratory infection of multiple sites prob covid 19 infection  J06.9    day 6-7    3. Close exposure to COVID-19 virus  Z20.822    unvaccinated      Most likely covid 19 infection  unvaccinated  non alarming sx  day 6 + tout of antiviral window fortunately no alarm findings.  Disc testing  consider rapid for confirmation pcr ok but not sure would change any  guidance today .  Sx rx  cough control  discussed  10 day isolation she agrees  Fu emergent care with alarm sx  Counseled.   Expectant management and discussion of plan and treatment with opportunity to ask questions and all were answered. The patient agreed with the plan and demonstrated an understanding of the instructions.   Advised to call back or seek an in-person evaluation if worsening  or having  further concerns  in interim. Return if symptoms worsen or fail to improve as expected.    Shanon Ace, MD

## 2021-06-03 ENCOUNTER — Encounter: Payer: Self-pay | Admitting: Internal Medicine

## 2021-06-03 DIAGNOSIS — M9903 Segmental and somatic dysfunction of lumbar region: Secondary | ICD-10-CM | POA: Diagnosis not present

## 2021-06-03 DIAGNOSIS — M531 Cervicobrachial syndrome: Secondary | ICD-10-CM | POA: Diagnosis not present

## 2021-06-03 DIAGNOSIS — M9902 Segmental and somatic dysfunction of thoracic region: Secondary | ICD-10-CM | POA: Diagnosis not present

## 2021-06-03 DIAGNOSIS — M9901 Segmental and somatic dysfunction of cervical region: Secondary | ICD-10-CM | POA: Diagnosis not present

## 2021-06-04 ENCOUNTER — Other Ambulatory Visit: Payer: Self-pay

## 2021-06-04 ENCOUNTER — Ambulatory Visit: Payer: Medicare HMO

## 2021-06-04 DIAGNOSIS — M6281 Muscle weakness (generalized): Secondary | ICD-10-CM

## 2021-06-04 DIAGNOSIS — Z483 Aftercare following surgery for neoplasm: Secondary | ICD-10-CM

## 2021-06-04 DIAGNOSIS — R293 Abnormal posture: Secondary | ICD-10-CM

## 2021-06-04 DIAGNOSIS — M25611 Stiffness of right shoulder, not elsewhere classified: Secondary | ICD-10-CM | POA: Diagnosis not present

## 2021-06-04 DIAGNOSIS — R600 Localized edema: Secondary | ICD-10-CM

## 2021-06-04 DIAGNOSIS — D0512 Intraductal carcinoma in situ of left breast: Secondary | ICD-10-CM | POA: Diagnosis not present

## 2021-06-04 NOTE — Therapy (Signed)
[facility] @ [place], [place], [address] Phone: [phone]   Fax:  [phone]  Physical Therapy Treatment  Patient Details  Name: [name] MRN: [id number] Date of Birth: [birth year] Referring Provider (PT): Cira Rue   Encounter Date: 06/04/2021   PT End of Session - 06/04/21 1127     Visit Number 5    Number of Visits 12    Date for PT Re-Evaluation 06/19/21    PT Start Time 7893    PT Stop Time 1150    PT Time Calculation (min) 48 min    Activity Tolerance Patient tolerated treatment well    Behavior During Therapy [facility] for tasks assessed/performed             Past Medical History:  Diagnosis Date   Anal fissure    Atrial septal aneurysm / if pfo  echo 6 13  10/23/2011   Breast cancer ([place])    Cataract    both eyes   Depression    Fatty liver    "pre fatty liver"   Fibromyalgia    Headache(784.0)    hx of migraines when younger   Heart palpitations    hx with normal holter event monitoring   Hx: UTI (urinary tract infection)    Hyperlipidemia    Hypertension    Hypothyroidism    Obesity    Pneumonia 1972   hx of   Polyp of colon    Serrated adenoma of colon 08/2012   Skin cancer    basal, squamous cell   Sleep apnea    uses cpap    Past Surgical History:  Procedure Laterality Date   BREAST LUMPECTOMY WITH RADIOACTIVE SEED AND SENTINEL LYMPH NODE BIOPSY Right 07/26/2020   Procedure: RIGHT BREAST LUMPECTOMY WITH RADIOACTIVE SEED AND SENTINEL LYMPH NODE BIOPSY;  Surgeon: Jovita Kussmaul, MD;  Location: [facility];  Service: General;  Laterality: Right;   BREAST LUMPECTOMY WITH RADIOACTIVE SEED LOCALIZATION Left 07/26/2020   Procedure: LEFT BREAST LUMPECTOMY X 2  WITH RADIOACTIVE SEED LOCALIZATION;  Surgeon: Jovita Kussmaul, MD;  Location: [facility];  Service: General;  Laterality: Left;   COLONOSCOPY     DILATION AND CURETTAGE OF [place]  ARTHROSCOPY     both in past   POLYPECTOMY     [place] Left 07/07/2013   Procedure: LEFT SHOULDER MINI OPEN SUBACROMIAL DECOMPRESSION ROTATOR CUFF REPAIR AND POSSIBLE PATCH GRAFT ;  Surgeon: Johnn Hai, MD;  Location[facility];  Service: Orthopedics;  Laterality: Left;  with interscaline block   SKIN CANCER EXCISION Bilateral    arm, legs, and chest   TONSILLECTOMY      There were no vitals filed for this visit.   Subjective Assessment - 06/04/21 1106     Subjective I was exposed to Covid and 19/20 of [place] got it. Numbness is alot better with normal activity.  I worked on the swelling but its still hard at the lower inferior breast.  I have been wearing the compression bra since I found it but I get alittle itchy. I don't see a big difference in swelling with it. I want you to check my bra. My forearm has been hurting since she twisted and pulled on my arm several weeks ago.    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 She underwent a left lumpectomy for DCIS and a right lumpectomy and sentinel node biopsy on 07/26/2020. She had 2 lymph nodes removed with 1 being positive for cancer on the right side. It is ER/PR positive. Left shoulder rotator cuff repair 06/2013. Last radiation on 01/15/21.  Seroma that was drained x 2-3.    Patient Stated Goals Reduce the numbness/pain, be able to sleep on right side,improve shoulderROM, decrease tightness    Currently in Pain? Yes    Pain Score 3     Pain Location --   forearm tenderness to touch   Pain Orientation Right    Pain Descriptors / Indicators Sore    Pain Type Acute pain    Pain Onset 1 to 4 weeks ago    Pain Frequency Intermittent    Aggravating Factors  touching it is tender    Multiple Pain Sites No                   LYMPHEDEMA/ONCOLOGY QUESTIONNAIRE - 06/04/21 0001       Right Upper Extremity Lymphedema   15 cm Proximal to  Olecranon Process 35.6 cm    10 cm Proximal to Olecranon Process 33.1 cm    Olecranon Process 26.8 cm    15 cm Proximal to Ulnar Styloid Process 27.8 cm    10 cm Proximal to Ulnar Styloid Process 25.1 cm    Just Proximal to Ulnar Styloid Process 19 cm    At Base of 2nd Digit 6.7 cm      Left Upper Extremity Lymphedema   15 cm Proximal to Olecranon Process 36.2 cm    10 cm Proximal to Olecranon Process 33.4 cm    Olecranon Process 27.7 cm    15 cm Proximal to Ulnar Styloid Process 26.5 cm    10 cm Proximal to Ulnar Styloid Process 23.7 cm    Just Proximal to Ulnar Styloid Process 17.8 cm    At Base of 2nd Digit 6.5 cm                        [facility] Adult PT Treatment/Exercise - 06/04/21 0001       Manual Therapy   Edema Management pts arm circumference remeasured, and right breast/seroma assessed    Manual Lymphatic Drainage (MLD) in supine: short neck, 5 diaphragmatic breaths, left axillary lymph nodes. right inguinal nodes, establishment of axillo inguinal anastomosis and inter axillary anastomosis then Right UE lateral upper arm, medial to lateral, lateral arm to shoulder and retracing pathways, then right forearm both sides retracing all steps and  moving fluid towards pathways  then retracing all steps and ending with LN's                          PT Long Term Goals - 05/08/21 1708       PT LONG TERM GOAL #1   Title Pt will have decreased axillary pain/tightness by atleast 50%    Time 6    Period Weeks    Status New    Target Date 06/19/21      PT LONG TERM GOAL #2   Title Pt will be independent in self MLD of right breast to decrease swelling    Time 6    Period Weeks    Status New    Target Date 06/19/21      PT LONG TERM GOAL #3   Title Pt will  be able to stretch right shoulder/axillary region with minimal numbness    Time 6    Period Weeks    Status New    Target Date 06/19/21      PT LONG TERM GOAL #4   Title pt will be able to  lie on right side without right arm numbness    Time 6    Period Weeks    Status New    Target Date 06/19/21                   Plan - 06/04/21 1151     Clinical Impression Statement Pt came in with complaints of forearm soreness/tenderness she said was present from visit before last after a therapist manipulated her arm and it was getting painful.She had to tell her to stop.  Pts arm was remeasured and there was some increase in size in her forearm and veins were less visible..  PT performed MLD to the right arm and trunk, but arm was still tender afterwards. It was suggested that she wear her compression sleeve for the next couple of days and we will reassess next visit. Pts breast was examined for fibrosis and seroma.  Seroma is firm, but non tender. There are still enlatged pores and some mild fibrosis in the medial lower breast. Pt has been wearing her compression bra and she will use foam pad over areas of fibrosis prn.    Personal Factors and Comorbidities Age;Time since onset of injury/illness/exacerbation;Comorbidity 3+    Comorbidities Bilateral breast CA with Right SLNB, radiation,chemo, FMS,hypothyroidism, left RTC repair, cervical problems mainly on left    Stability/Clinical Decision Making Stable/Uncomplicated    Rehab Potential Good    PT Frequency 2x / week    PT Duration 6 weeks    PT Treatment/Interventions ADLs/Self Care Home Management;Therapeutic exercise;Patient/family education;Neuromuscular re-education;Manual techniques;Manual lymph drainage;Passive range of motion;Taping    PT Next Visit Plan check right UE swelling/tenderness, check goals, instruct MLD to UE prn,STM to right pectorals especially axillary border and lats, MFR techniques, PROM, progress to right breast MLD, review self MLD - ask how foam worked    PT [place] MLD daily    Consulted and Agree with Plan of Care Patient             Patient will benefit from skilled therapeutic  intervention in order to improve the following deficits and impairments:  Decreased activity tolerance, Decreased range of motion, Pain, Impaired sensation, Increased edema, Postural dysfunction, Decreased strength  Visit Diagnosis: Aftercare following surgery for neoplasm  Localized edema  Abnormal posture  Stiffness of right shoulder, not elsewhere classified  Muscle weakness (generalized)     Problem List Patient Active Problem List   Diagnosis Date Noted   Malignant neoplasm of upper-outer quadrant of right breast in female, estrogen receptor positive ([place]) 06/25/2020   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   Hepatic steatosis 11/27/2014   Hx of adenomatous colonic polyps 11/27/2014   Visit for preventive health examination 03/28/2014   Hyperlipidemia 03/28/2014   Left rotator cuff tear 07/07/2013   Tear of left rotator cuff 07/07/2013   Rhinitis 06/01/2013   Chest wall deformity 04/12/2012   Atrial septal aneurysm / if pfo  echo 6 13  10/23/2011   OSA (obstructive sleep apnea) 11/27/2010   Dyslipidemia 11/01/2010   [place] 08/29/2010   Labile hypertension 08/29/2010   MUSCLE CRAMPS, FOOT 06/10/2010   OTHER SLEEP DISTURBANCES 06/10/2010  ANXIETY, SITUATIONAL 05/08/2010   VERTIGO, POSITIONAL 03/15/2010   VITAMIN D DEFICIENCY 02/18/2010   OBESITY 02/18/2010   ALLERGIC RHINITIS 02/18/2010   PLANTAR FASCIITIS 02/18/2010   TWITCHING 06/11/2009   NUMBNESS, HAND 06/11/2009   CERVICAL STRAIN, ACUTE 06/11/2009   OTHER MALAISE AND FATIGUE 07/09/2007   HYPERTENSION 01/28/2007   Hypothyroidism 11/11/2006   COMMON MIGRAINE 11/11/2006   GERD 11/11/2006   FIBROMYALGIA 11/11/2006    Claris Pong, PT 06/04/2021, 12:06 PM  [facility] @ [place], [place], [address] Phone: [phone]   Fax:  [phone]  Name: [name] MRN: [id number] Date of Birth: 1955/11/11

## 2021-06-06 ENCOUNTER — Other Ambulatory Visit: Payer: Self-pay

## 2021-06-06 ENCOUNTER — Ambulatory Visit: Payer: Medicare HMO

## 2021-06-06 DIAGNOSIS — M6281 Muscle weakness (generalized): Secondary | ICD-10-CM

## 2021-06-06 DIAGNOSIS — R600 Localized edema: Secondary | ICD-10-CM | POA: Diagnosis not present

## 2021-06-06 DIAGNOSIS — Z483 Aftercare following surgery for neoplasm: Secondary | ICD-10-CM | POA: Diagnosis not present

## 2021-06-06 DIAGNOSIS — M25611 Stiffness of right shoulder, not elsewhere classified: Secondary | ICD-10-CM

## 2021-06-06 DIAGNOSIS — R293 Abnormal posture: Secondary | ICD-10-CM

## 2021-06-06 DIAGNOSIS — D0512 Intraductal carcinoma in situ of left breast: Secondary | ICD-10-CM | POA: Diagnosis not present

## 2021-06-06 NOTE — Therapy (Signed)
[facility] @ [place], [place], [address] Phone: [phone]   Fax:  [phone]  Physical Therapy Treatment  Patient Details  Name: [name] MRN: [id number] Date of Birth: 04/10/1956 Referring Provider (PT): Cira Rue   Encounter Date: 06/06/2021   PT End of Session - 06/06/21 1259     Visit Number 6    Number of Visits 12    Date for PT Re-Evaluation 06/19/21    PT Start Time 1300    PT Stop Time 6222    PT Time Calculation (min) 53 min    Activity Tolerance Patient tolerated treatment well    Behavior During Therapy [facility] for tasks assessed/performed             Past Medical History:  Diagnosis Date   Anal fissure    Atrial septal aneurysm / if pfo  echo 6 13  10/23/2011   Breast cancer ([place])    Cataract    both eyes   Depression    Fatty liver    "pre fatty liver"   Fibromyalgia    Headache(784.0)    hx of migraines when younger   Heart palpitations    hx with normal holter event monitoring   Hx: UTI (urinary tract infection)    Hyperlipidemia    Hypertension    Hypothyroidism    Obesity    Pneumonia 1972   hx of   Polyp of colon    Serrated adenoma of colon 08/2012   Skin cancer    basal, squamous cell   Sleep apnea    uses cpap    Past Surgical History:  Procedure Laterality Date   BREAST LUMPECTOMY WITH RADIOACTIVE SEED AND SENTINEL LYMPH NODE BIOPSY Right 07/26/2020   Procedure: RIGHT BREAST LUMPECTOMY WITH RADIOACTIVE SEED AND SENTINEL LYMPH NODE BIOPSY;  Surgeon: Jovita Kussmaul, MD;  Location: [facility];  Service: General;  Laterality: Right;   BREAST LUMPECTOMY WITH RADIOACTIVE SEED LOCALIZATION Left 07/26/2020   Procedure: LEFT BREAST LUMPECTOMY X 2  WITH RADIOACTIVE SEED LOCALIZATION;  Surgeon: Jovita Kussmaul, MD;  Location: [facility];  Service: General;  Laterality: Left;   COLONOSCOPY     DILATION AND CURETTAGE OF [place]  ARTHROSCOPY     both in past   POLYPECTOMY     [place] Left 07/07/2013   Procedure: LEFT SHOULDER MINI OPEN SUBACROMIAL DECOMPRESSION ROTATOR CUFF REPAIR AND POSSIBLE PATCH GRAFT ;  Surgeon: Johnn Hai, MD;  Location: [facility];  Service: Orthopedics;  Laterality: Left;  with interscaline block   SKIN CANCER EXCISION Bilateral    arm, legs, and chest   TONSILLECTOMY      There were no vitals filed for this visit.   Subjective Assessment - 06/06/21 1300     Subjective I have been wearing the sleeve and gauntlet during the day and taking it off at night.  I still have the sensitive places and it still feels puffy. 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 She underwent a left lumpectomy for DCIS and a right lumpectomy and sentinel node biopsy on 07/26/2020. She had 2 lymph nodes removed with 1 being positive for cancer on the right side. It is ER/PR positive. Left shoulder  rotator cuff repair 06/2013. Last radiation on 01/15/21.  Seroma that was drained x 2-3.                               [facility] Adult PT Treatment/Exercise - 06/06/21 0001       Manual Therapy   Edema Management checked pts. sleeve/gauntlet and is a good fit   Soft tissue mobilization in supine to R pec and then to L sidelying to R lats, serratus, upper traps and with cocoa butter to periscapular area    Myofascial Release To Rt axilla, medial upper arm and forearm/antecubital fossa    Neural Stretch reminded about owl stretch and showed single arm chest stretch with wrist extension                          PT Long Term Goals - 06/06/21 1308       PT LONG TERM GOAL #1   Title Pt will have decreased axillary pain/tightness by atleast 50%    Time 6    Period Weeks    Status Achieved   Target Date 06/06/21       PT LONG TERM GOAL #2   Title Pt will be independent in self MLD of right breast to decrease swelling    Time 6    Period Weeks    Status Achieved    Target Date 06/06/21      PT LONG TERM GOAL #3   Title Pt will be able to stretch right shoulder/axillary region with minimal numbness    Baseline 75% better    Time 6    Period Weeks    Status Achieved    Target Date 06/06/21      PT LONG TERM GOAL #4   Title pt will be able to lie on right side without right arm numbness    Time 6    Period Weeks    Status Achieved    Target Date 06/06/21      PT LONG TERM GOAL #5   Title Pt will have decreased forearm and upper arm tightness/tenderness from cording decreased by atleast 50%    Time 2    Period Weeks    Status New    Target Date 06/20/21                   Plan - 06/06/21 1259     Clinical Impression Statement Pt continues with complaints of forearm and medial arm tightness and tenderness. Performed soft tissue mobilization to the right upper quarter with tightness noted especially in right pecs and lats. Performed MFR techniques to right UE medial upper arm, antecubital fossa, and forearm.  Cording is not very visible but several small cords were noted running from the upper arm across the elbow and pt felt tightness with MFR techniques.  She was instructed in NTS at wall and felt a good stretch, and was reminded about the owl stretch.  Mild swelling is still noted at the wrist, but pt is compliant with compression garments.  She is making good progress toward goals.    Personal Factors and Comorbidities Age;Time since onset of injury/illness/exacerbation;Comorbidity 3+    Comorbidities Bilateral breast CA with Right SLNB, radiation,chemo, FMS,hypothyroidism, left RTC repair, cervical problems mainly on left    Stability/Clinical Decision Making Stable/Uncomplicated    Rehab Potential Good    PT Frequency 2x / week  PT Treatment/Interventions ADLs/Self Care Home  Management;Therapeutic exercise;Patient/family education;Neuromuscular re-education;Manual techniques;Manual lymph drainage;Passive range of motion;Taping    PT Next Visit Plan check right UE swelling/tenderness,  instruct MLD to UE prn,STM to right pectorals especially axillary border and lats, MFR techniques, PROM, progress to right breast MLD, review self MLD - ask how foam worked    PT Home Exercise Plan Self MLD daily             Patient will benefit from skilled therapeutic intervention in order to improve the following deficits and impairments:  Decreased activity tolerance, Decreased range of motion, Pain, Impaired sensation, Increased edema, Postural dysfunction, Decreased strength  Visit Diagnosis: Aftercare following surgery for neoplasm  Localized edema  Abnormal posture  Stiffness of right shoulder, not elsewhere classified  Muscle weakness (generalized)     Problem List Patient Active Problem List   Diagnosis Date Noted   Malignant neoplasm of upper-outer quadrant of right breast in female, estrogen receptor positive ([place]) 06/25/2020   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   Hepatic steatosis 11/27/2014   Hx of adenomatous colonic polyps 11/27/2014   Visit for preventive health examination 03/28/2014   Hyperlipidemia 03/28/2014   Left rotator cuff tear 07/07/2013   Tear of left rotator cuff 07/07/2013   Rhinitis 06/01/2013   Chest wall deformity 04/12/2012   Atrial septal aneurysm / if pfo  echo 6 13  10/23/2011   OSA (obstructive sleep apnea) 11/27/2010   Dyslipidemia 11/01/2010   Flushing 08/29/2010   Labile hypertension 08/29/2010   MUSCLE CRAMPS, FOOT 06/10/2010   OTHER SLEEP DISTURBANCES 06/10/2010   ANXIETY, SITUATIONAL 05/08/2010   VERTIGO, POSITIONAL 03/15/2010   VITAMIN D DEFICIENCY 02/18/2010   OBESITY 02/18/2010   ALLERGIC RHINITIS 02/18/2010   PLANTAR FASCIITIS 02/18/2010   TWITCHING 06/11/2009   NUMBNESS, HAND  06/11/2009   CERVICAL STRAIN, ACUTE 06/11/2009   OTHER MALAISE AND FATIGUE 07/09/2007   HYPERTENSION 01/28/2007   Hypothyroidism 11/11/2006   COMMON MIGRAINE 11/11/2006   GERD 11/11/2006   FIBROMYALGIA 11/11/2006    Claris Pong, PT 06/06/2021, 2:00 PM  [facility] @ [place], [place], [address] Phone: [phone]   Fax:  [phone]  Name: [name] MRN: [id number] Date of Birth: May 03, 1956

## 2021-06-10 ENCOUNTER — Other Ambulatory Visit: Payer: Self-pay

## 2021-06-10 ENCOUNTER — Ambulatory Visit
Admission: RE | Admit: 2021-06-10 | Discharge: 2021-06-10 | Disposition: A | Discharge status: Home / Self Care | Payer: Medicare HMO | Source: Ambulatory Visit | Attending: Nurse Practitioner | Admitting: Nurse Practitioner

## 2021-06-10 DIAGNOSIS — Z853 Personal history of malignant neoplasm of breast: Secondary | ICD-10-CM | POA: Diagnosis not present

## 2021-06-10 DIAGNOSIS — R922 Inconclusive mammogram: Secondary | ICD-10-CM | POA: Diagnosis not present

## 2021-06-10 DIAGNOSIS — C50411 Malignant neoplasm of upper-outer quadrant of right female breast: Secondary | ICD-10-CM

## 2021-06-10 DIAGNOSIS — D0512 Intraductal carcinoma in situ of left breast: Secondary | ICD-10-CM

## 2021-06-10 HISTORY — DX: Personal history of irradiation: Z92.3

## 2021-06-10 HISTORY — DX: Personal history of antineoplastic chemotherapy: Z92.21

## 2021-06-11 ENCOUNTER — Ambulatory Visit: Payer: Medicare HMO

## 2021-06-11 ENCOUNTER — Encounter: Payer: Self-pay | Admitting: Nurse Practitioner

## 2021-06-11 DIAGNOSIS — Z483 Aftercare following surgery for neoplasm: Secondary | ICD-10-CM

## 2021-06-11 DIAGNOSIS — R600 Localized edema: Secondary | ICD-10-CM

## 2021-06-11 DIAGNOSIS — R293 Abnormal posture: Secondary | ICD-10-CM

## 2021-06-11 DIAGNOSIS — M6281 Muscle weakness (generalized): Secondary | ICD-10-CM | POA: Diagnosis not present

## 2021-06-11 DIAGNOSIS — D0512 Intraductal carcinoma in situ of left breast: Secondary | ICD-10-CM | POA: Diagnosis not present

## 2021-06-11 DIAGNOSIS — M25611 Stiffness of right shoulder, not elsewhere classified: Secondary | ICD-10-CM

## 2021-06-11 NOTE — Therapy (Signed)
[facility] @ [place], [place], [address] Phone: [phone]   Fax:  [phone]  Physical Therapy Treatment  Patient Details  Name: [name] MRN: [id number] Date of Birth: 1956/01/07 Referring Provider (PT): Cira Rue   Encounter Date: 06/11/2021   PT End of Session - 06/11/21 1112     Visit Number 7    Number of Visits 12    Date for PT Re-Evaluation 06/19/21    PT Start Time 1104    PT Stop Time 1155    PT Time Calculation (min) 51 min    Activity Tolerance Patient tolerated treatment well    Behavior During Therapy [facility] for tasks assessed/performed             Past Medical History:  Diagnosis Date   Anal fissure    Atrial septal aneurysm / if pfo  echo 6 13  10/23/2011   Breast cancer ([place])    Cataract    both eyes   Depression    Fatty liver    "pre fatty liver"   Fibromyalgia    Headache(784.0)    hx of migraines when younger   Heart palpitations    hx with normal holter event monitoring   Hx: UTI (urinary tract infection)    Hyperlipidemia    Hypertension    Hypothyroidism    Obesity    Personal history of chemotherapy    Personal history of radiation therapy    Pneumonia 1972   hx of   Polyp of colon    Serrated adenoma of colon 08/2012   Skin cancer    basal, squamous cell   Sleep apnea    uses cpap    Past Surgical History:  Procedure Laterality Date   BREAST LUMPECTOMY     BREAST LUMPECTOMY WITH RADIOACTIVE SEED AND SENTINEL LYMPH NODE BIOPSY Right 07/26/2020   Procedure: RIGHT BREAST LUMPECTOMY WITH RADIOACTIVE SEED AND SENTINEL LYMPH NODE BIOPSY;  Surgeon: Jovita Kussmaul, MD;  Location: [facility];  Service: General;  Laterality: Right;   BREAST LUMPECTOMY WITH RADIOACTIVE SEED LOCALIZATION Left 07/26/2020   Procedure: LEFT BREAST LUMPECTOMY X 2  WITH RADIOACTIVE SEED LOCALIZATION;  Surgeon: Jovita Kussmaul, MD;  Location: [facility];  Service: General;  Laterality:  Left;   COLONOSCOPY     DILATION AND CURETTAGE OF [place] ARTHROSCOPY     both in past   POLYPECTOMY     SHOULDER OPEN [place] Left 07/07/2013   Procedure: LEFT SHOULDER MINI OPEN SUBACROMIAL DECOMPRESSION ROTATOR CUFF REPAIR AND POSSIBLE PATCH GRAFT ;  Surgeon: Johnn Hai, MD;  [facility];  Service: Orthopedics;  Laterality: Left;  with interscaline block   SKIN CANCER EXCISION Bilateral    arm, legs, and chest   TONSILLECTOMY      There were no vitals filed for this visit.   Subjective Assessment - 06/11/21 1106     Subjective The arm is about the same. I tried doing the MFR techniques like you do with your fingers and it seemed to help some. Cording is still present in the axillary area. Had mammogram yesterday and it was good.    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 She underwent a left lumpectomy for DCIS and a right lumpectomy and sentinel node biopsy on 07/26/2020. She had 2 lymph  nodes removed with 1 being positive for cancer on the right side. It is ER/PR positive. Left shoulder rotator cuff repair 06/2013. Last radiation on 01/15/21.  Seroma that was drained x 2-3.    Patient Stated Goals Reduce the numbness/pain, be able to sleep on right side,improve shoulderROM, decrease tightness    Currently in Pain? Yes    Pain Score 1     Pain Location Axilla   and forearm   Pain Orientation Right    Pain Descriptors / Indicators Sore    Pain Onset 1 to 4 weeks ago    Pain Frequency Intermittent                               [facility] Adult PT Treatment/Exercise - 06/11/21 0001       Shoulder Exercises: Supine   Horizontal ABduction Strengthening;Both;10 reps    Theraband Level (Shoulder Horizontal ABduction) Level 1 (Yellow)    External Rotation Strengthening;Both;10 reps    Theraband Level (Shoulder External  Rotation) Level 1 (Yellow)    Flexion Strengthening;Both;10 reps    Theraband Level (Shoulder Flexion) Level 1 (Yellow)    Diagonals Strengthening;Both;10 reps    Theraband Level (Shoulder Diagonals) Level 1 (Yellow)    Other Supine Exercises supine wand scaption x 5      Manual Therapy   Manual Therapy Soft tissue mobilization;Myofascial release    Soft tissue mobilization supine to right pecs, lats, UT, and  forearm and upper arm with cocoa butterd to right medial  arm   Myofascial Release To Rt axilla, medial upper arm and forearm/antecubital fossa                     PT Education - 06/11/21 1157     Education Details supine scapular series x 10 to do every other day    Person(s) Educated Patient    Methods Handout;Demonstration    Comprehension Returned demonstration                 PT Long Term Goals - 06/06/21 1308       PT LONG TERM GOAL #1   Title Pt will have decreased axillary pain/tightness by atleast 50%    Time 6    Period Weeks    Status New    Target Date 06/06/21      PT LONG TERM GOAL #2   Title Pt will be independent in self MLD of right breast to decrease swelling    Time 6    Period Weeks    Status Achieved    Target Date 06/06/21      PT LONG TERM GOAL #3   Title Pt will be able to stretch right shoulder/axillary region with minimal numbness    Baseline 75% better    Time 6    Period Weeks    Status Achieved    Target Date 06/06/21      PT LONG TERM GOAL #4   Title pt will be able to lie on right side without right arm numbness    Time 6    Period Weeks    Status Achieved    Target Date 06/06/21      PT LONG TERM GOAL #5   Title Pt will have decreased forearm and upper arm tightness/tenderness from cording decreased by atleast 50%    Time 2    Period Weeks    Status New  Target Date 06/20/21                   Plan - 06/11/21 1114     Clinical Impression Statement Continued MFR techniques from axilla to  forearm working on tightness from multiple cords. Performed soft tissue mobilization to right pecs, lats and UT, as well as right medial arm to relieve discomfor from cording.  Reviewed supine scapular series and gave new pictures and a yellow band for pt to continue at home.  Pt continues with tightness in the forearm, upper arm and axilla with multiple small cords noted but not easily seen. Pt is compliant with breast MLD and compression sleeve/gauntlet    Personal Factors and Comorbidities Age;Time since onset of injury/illness/exacerbation;Comorbidity 3+    Comorbidities Bilateral breast CA with Right SLNB, radiation,chemo, FMS,hypothyroidism, left RTC repair, cervical problems mainly on left    Stability/Clinical Decision Making Stable/Uncomplicated    Rehab Potential Good    PT Frequency 2x / week    PT Duration 6 weeks    PT Treatment/Interventions ADLs/Self Care Home Management;Therapeutic exercise;Patient/family education;Neuromuscular re-education;Manual techniques;Manual lymph drainage;Passive range of motion;Taping    PT Next Visit Plan check right UE swelling/tenderness, check goals, instruct MLD to UE prn,STM to right pectorals especially axillary border and lats, MFR techniques, PROM, progress to right breast MLD, review self MLD - ask how foam worked    PT [place] MLD daily    Consulted and Agree with Plan of Care Patient             Patient will benefit from skilled therapeutic intervention in order to improve the following deficits and impairments:  Decreased activity tolerance, Decreased range of motion, Pain, Impaired sensation, Increased edema, Postural dysfunction, Decreased strength  Visit Diagnosis: Aftercare following surgery for neoplasm  Localized edema  Abnormal posture  Stiffness of right shoulder, not elsewhere classified  Muscle weakness (generalized)     Problem List Patient Active Problem List   Diagnosis Date Noted   Malignant  neoplasm of upper-outer quadrant of right breast in female, estrogen receptor positive ([place]) 06/25/2020   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   Hepatic steatosis 11/27/2014   Hx of adenomatous colonic polyps 11/27/2014   Visit for preventive health examination 03/28/2014   Hyperlipidemia 03/28/2014   Left rotator cuff tear 07/07/2013   Tear of left rotator cuff 07/07/2013   Rhinitis 06/01/2013   Chest wall deformity 04/12/2012   Atrial septal aneurysm / if pfo  echo 6 13  10/23/2011   OSA (obstructive sleep apnea) 11/27/2010   Dyslipidemia 11/01/2010   Flushing 08/29/2010   Labile hypertension 08/29/2010   MUSCLE CRAMPS, FOOT 06/10/2010   OTHER SLEEP DISTURBANCES 06/10/2010   ANXIETY, SITUATIONAL 05/08/2010   VERTIGO, POSITIONAL 03/15/2010   VITAMIN D DEFICIENCY 02/18/2010   OBESITY 02/18/2010   ALLERGIC RHINITIS 02/18/2010   PLANTAR FASCIITIS 02/18/2010   TWITCHING 06/11/2009   NUMBNESS, HAND 06/11/2009   CERVICAL STRAIN, ACUTE 06/11/2009   OTHER MALAISE AND FATIGUE 07/09/2007   HYPERTENSION 01/28/2007   Hypothyroidism 11/11/2006   COMMON MIGRAINE 11/11/2006   GERD 11/11/2006   FIBROMYALGIA 11/11/2006    Claris Pong, PT 06/11/2021, 12:01 PM  [facility] @ [place], [place], [address] Phone: [phone]   Fax:  [phone]  Name: [name] MRN: [id number] Date of Birth: [birth year]

## 2021-06-11 NOTE — Patient Instructions (Signed)

## 2021-06-12 ENCOUNTER — Telehealth: Payer: Self-pay | Admitting: Internal Medicine

## 2021-06-12 NOTE — Chronic Care Management (AMB) (Signed)
°  Chronic Care Management   Outreach Note  06/12/2021 Name: WESTLYNN FIFER MRN: [id number] DOB: Aug 23, 1955  Referred by: Burnis Medin, MD Reason for referral : No chief complaint on file.   An unsuccessful telephone outreach was attempted today. The patient was referred to the pharmacist for assistance with care management and care coordination.   Follow Up Plan:   Tatjana Dellinger Upstream Scheduler

## 2021-06-13 ENCOUNTER — Ambulatory Visit: Payer: Medicare HMO | Attending: Hematology | Admitting: Physical Therapy

## 2021-06-13 ENCOUNTER — Other Ambulatory Visit: Payer: Self-pay

## 2021-06-13 ENCOUNTER — Encounter: Payer: Self-pay | Admitting: Physical Therapy

## 2021-06-13 DIAGNOSIS — Z483 Aftercare following surgery for neoplasm: Secondary | ICD-10-CM | POA: Diagnosis not present

## 2021-06-13 DIAGNOSIS — M25611 Stiffness of right shoulder, not elsewhere classified: Secondary | ICD-10-CM | POA: Diagnosis not present

## 2021-06-13 DIAGNOSIS — M6281 Muscle weakness (generalized): Secondary | ICD-10-CM | POA: Diagnosis not present

## 2021-06-13 DIAGNOSIS — R600 Localized edema: Secondary | ICD-10-CM | POA: Diagnosis not present

## 2021-06-13 DIAGNOSIS — R293 Abnormal posture: Secondary | ICD-10-CM | POA: Diagnosis not present

## 2021-06-13 NOTE — Therapy (Signed)
[facility] @ [place], [place], [address] Phone: [phone]   Fax:  [phone]  Physical Therapy Treatment  Patient Details  Name: [name] MRN: [id number] Date of Birth: 1955/08/07 Referring Provider (PT): Cira Rue   Encounter Date: 06/13/2021   PT End of Session - 06/13/21 1329     Visit Number 8    Number of Visits 12    Date for PT Re-Evaluation 06/19/21    PT Start Time 1200    PT Stop Time 2229    PT Time Calculation (min) 55 min    Activity Tolerance Patient tolerated treatment well    Behavior During Therapy [facility] for tasks assessed/performed             Past Medical History:  Diagnosis Date   Anal fissure    Atrial septal aneurysm / if pfo  echo 6 13  10/23/2011   Breast cancer ([place])    Cataract    both eyes   Depression    Fatty liver    "pre fatty liver"   Fibromyalgia    Headache(784.0)    hx of migraines when younger   Heart palpitations    hx with normal holter event monitoring   Hx: UTI (urinary tract infection)    Hyperlipidemia    Hypertension    Hypothyroidism    Obesity    Personal history of chemotherapy    Personal history of radiation therapy    Pneumonia 1972   hx of   Polyp of colon    Serrated adenoma of colon 08/2012   Skin cancer    basal, squamous cell   Sleep apnea    uses cpap    Past Surgical History:  Procedure Laterality Date   BREAST LUMPECTOMY     BREAST LUMPECTOMY WITH RADIOACTIVE SEED AND SENTINEL LYMPH NODE BIOPSY Right 07/26/2020   Procedure: RIGHT BREAST LUMPECTOMY WITH RADIOACTIVE SEED AND SENTINEL LYMPH NODE BIOPSY;  Surgeon: Jovita Kussmaul, MD;  Location: [facility];  Service: General;  Laterality: Right;   BREAST LUMPECTOMY WITH RADIOACTIVE SEED LOCALIZATION Left 07/26/2020   Procedure: LEFT BREAST LUMPECTOMY X 2  WITH RADIOACTIVE SEED LOCALIZATION;  Surgeon: Jovita Kussmaul, MD;  Location: [facility];  Service: General;  Laterality:  Left;   COLONOSCOPY     DILATION AND CURETTAGE OF [place] ARTHROSCOPY     both in past   POLYPECTOMY     SHOULDER OPEN [place] Left 07/07/2013   Procedure: LEFT SHOULDER MINI OPEN SUBACROMIAL DECOMPRESSION ROTATOR CUFF REPAIR AND POSSIBLE PATCH GRAFT ;  Surgeon: Johnn Hai, MD;  Location: [facility];  Service: Orthopedics;  Laterality: Left;  with interscaline block   SKIN CANCER EXCISION Bilateral    arm, legs, and chest   TONSILLECTOMY      There were no vitals filed for this visit.   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 She underwent a left lumpectomy for DCIS and a right lumpectomy and sentinel node biopsy on 07/26/2020. She had 2  lymph nodes removed with 1 being positive for cancer on the right side. It is ER/PR positive. Left shoulder rotator cuff repair 06/2013. Last radiation on 01/15/21.  Seroma that was drained x 2-3.    Patient Stated Goals Reduce the numbness/pain, be able to sleep on right side,improve shoulderROM, decrease tightness    Currently in Pain? Yes    Pain Score 1     Pain Orientation Right    Pain Descriptors / Indicators Sore    Pain Type Acute pain    Pain Onset 1 to 4 weeks ago    Pain Frequency Intermittent                               [facility] Adult PT Treatment/Exercise - 06/13/21 0001       Manual Therapy   Manual Therapy Edema management;Soft tissue mobilization;Manual Lymphatic Drainage (MLD)    Manual therapy comments gave pt piece of thin foam to pad at tender area at wrisst    Soft tissue mobilization supine to right pecs,  lats, UT, an forearm and upper arm with cocoa butterd right medial    Myofascial Release to right forearm    Manual Lymphatic Drainage (MLD) in supine: short neck, 5 diaphragmatic breaths, left axillary lymph nodes. right inguinal nodes, establishment of axillo inguinal anastomosis and inter axillary anastomosis then Right UE lateral upper arm, medial to lateral, lateral arm to shoulder and retracing pathways, then right forearm both sides retracing all steps and ending with LN's moving fluid towards pathways  then retracing all steps then to left sidelying for brief posterior interaxillary anastamosis                          PT Long Term Goals - 06/06/21 1308       PT LONG TERM GOAL #1   Title Pt will have decreased axillary pain/tightness by atleast 50%    Time 6    Period Weeks    Status New    Target Date 06/06/21      PT LONG TERM GOAL #2   Title Pt will be independent in self MLD of right breast to decrease swelling    Time 6    Period Weeks    Status Achieved    Target Date 06/06/21      PT LONG TERM GOAL #3   Title Pt will be able to stretch right shoulder/axillary region with minimal numbness    Baseline 75% better    Time 6    Period Weeks    Status Achieved    Target Date 06/06/21      PT LONG TERM GOAL #4   Title pt will be able to lie on right side without right arm numbness    Time 6    Period Weeks    Status Achieved    Target Date 06/06/21      PT LONG TERM GOAL #5   Title Pt will have decreased forearm and upper arm tightness/tenderness from cording decreased by atleast 50%    Time 2    Period Weeks    Status New    Target Date 06/20/21                   Plan - 06/13/21 1330     Clinical Impression Statement Pt perisitent lymphedema in right breast with hyperplasia and dimpling in breast that softens  with MLD but returns depsite use of compression bra.  She also continues to have cording and fullness in her right foreams that  softens with MLD.  She is interested in finding out about coverage for Flexitouch so demographics were sent today.    Personal Factors and Comorbidities Age;Time since onset of injury/illness/exacerbation;Comorbidity 3+    Comorbidities Bilateral breast CA with Right SLNB, radiation,chemo, FMS,hypothyroidism, left RTC repair, cervical problems mainly on left    Stability/Clinical Decision Making Stable/Uncomplicated    Rehab Potential Good    PT Frequency 2x / week    PT Duration 6 weeks    PT Treatment/Interventions ADLs/Self Care Home Management;Therapeutic exercise;Patient/family education;Neuromuscular re-education;Manual techniques;Manual lymph drainage;Passive range of motion;Taping    PT Next Visit Plan check right UE swelling/tenderness, check goals, instruct MLD to UE prn,STM to right pectorals especially axillary border and lats, MFR techniques, PROM, progress to right breast MLD, review self MLD - ask how foam worked    Oncologist with Plan of Care Patient             Patient will benefit from skilled therapeutic intervention in order to improve the following deficits and impairments:  Decreased activity tolerance, Decreased range of motion, Pain, Impaired sensation, Increased edema, Postural dysfunction, Decreased strength  Visit Diagnosis: Aftercare following surgery for neoplasm  Localized edema  Abnormal posture  Stiffness of right shoulder, not elsewhere classified  Muscle weakness (generalized)     Problem List Patient Active Problem List   Diagnosis Date Noted   Malignant neoplasm of upper-outer quadrant of right breast in female, estrogen receptor positive ([place]) 06/25/2020   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   Hepatic steatosis 11/27/2014   Hx of adenomatous colonic polyps 11/27/2014   Visit for preventive health examination 03/28/2014   Hyperlipidemia 03/28/2014   Left rotator cuff tear 07/07/2013   Tear of left rotator  cuff 07/07/2013   Rhinitis 06/01/2013   Chest wall deformity 04/12/2012   Atrial septal aneurysm / if pfo  echo 6 13  10/23/2011   OSA (obstructive sleep apnea) 11/27/2010   Dyslipidemia 11/01/2010   Flushing 08/29/2010   Labile hypertension 08/29/2010   MUSCLE CRAMPS, FOOT 06/10/2010   OTHER SLEEP DISTURBANCES 06/10/2010   ANXIETY, SITUATIONAL 05/08/2010   VERTIGO, POSITIONAL 03/15/2010   VITAMIN D DEFICIENCY 02/18/2010   OBESITY 02/18/2010   ALLERGIC RHINITIS 02/18/2010   PLANTAR FASCIITIS 02/18/2010   TWITCHING 06/11/2009   NUMBNESS, HAND 06/11/2009   CERVICAL STRAIN, ACUTE 06/11/2009   OTHER MALAISE AND FATIGUE 07/09/2007   HYPERTENSION 01/28/2007   Hypothyroidism 11/11/2006   COMMON MIGRAINE 11/11/2006   GERD 11/11/2006   FIBROMYALGIA 11/11/2006   Helene Kelp K. Owens Shark PT  Norwood Levo, PT 06/13/2021, 1:33 PM  [facility] @ [place], [place], [address] Phone: [phone]   Fax:  [phone]  Name: KORRA CHRISTINE MRN: [id number] Date of Birth: 11-14-1955

## 2021-06-17 DIAGNOSIS — C50919 Malignant neoplasm of unspecified site of unspecified female breast: Secondary | ICD-10-CM | POA: Diagnosis not present

## 2021-06-17 DIAGNOSIS — R5381 Other malaise: Secondary | ICD-10-CM | POA: Diagnosis not present

## 2021-06-17 DIAGNOSIS — N959 Unspecified menopausal and perimenopausal disorder: Secondary | ICD-10-CM | POA: Diagnosis not present

## 2021-06-18 ENCOUNTER — Ambulatory Visit: Payer: Medicare HMO

## 2021-06-18 ENCOUNTER — Other Ambulatory Visit: Payer: Self-pay

## 2021-06-18 DIAGNOSIS — Z483 Aftercare following surgery for neoplasm: Secondary | ICD-10-CM

## 2021-06-18 DIAGNOSIS — R600 Localized edema: Secondary | ICD-10-CM | POA: Diagnosis not present

## 2021-06-18 DIAGNOSIS — M25611 Stiffness of right shoulder, not elsewhere classified: Secondary | ICD-10-CM

## 2021-06-18 DIAGNOSIS — M6281 Muscle weakness (generalized): Secondary | ICD-10-CM | POA: Diagnosis not present

## 2021-06-18 DIAGNOSIS — R293 Abnormal posture: Secondary | ICD-10-CM

## 2021-06-18 NOTE — Therapy (Signed)
[facility] @ [place], [place], [address] Phone: [phone]   Fax:  [phone]  Physical Therapy Treatment  Patient Details  Name: [name] MRN: [id number] Date of Birth: [birth year] Referring Provider (PT): Cira Rue   Encounter Date: 06/18/2021   PT End of Session - 06/18/21 1058     Visit Number 9    Number of Visits 12    Date for PT Re-Evaluation 06/19/21    PT Start Time 1059    PT Stop Time 1150    PT Time Calculation (min) 51 min    Activity Tolerance Patient tolerated treatment well    Behavior During Therapy [facility] for tasks assessed/performed             Past Medical History:  Diagnosis Date   Anal fissure    Atrial septal aneurysm / if pfo  echo 6 13  10/23/2011   Breast cancer ([place])    Cataract    both eyes   Depression    Fatty liver    "pre fatty liver"   Fibromyalgia    Headache(784.0)    hx of migraines when younger   Heart palpitations    hx with normal holter event monitoring   Hx: UTI (urinary tract infection)    Hyperlipidemia    Hypertension    Hypothyroidism    Obesity    Personal history of chemotherapy    Personal history of radiation therapy    Pneumonia 1972   hx of   Polyp of colon    Serrated adenoma of colon 08/2012   Skin cancer    basal, squamous cell   Sleep apnea    uses cpap    Past Surgical History:  Procedure Laterality Date   BREAST LUMPECTOMY     BREAST LUMPECTOMY WITH RADIOACTIVE SEED AND SENTINEL LYMPH NODE BIOPSY Right 07/26/2020   Procedure: RIGHT BREAST LUMPECTOMY WITH RADIOACTIVE SEED AND SENTINEL LYMPH NODE BIOPSY;  Surgeon: Jovita Kussmaul, MD;  Location: [facility];  Service: General;  Laterality: Right;   BREAST LUMPECTOMY WITH RADIOACTIVE SEED LOCALIZATION Left 07/26/2020   Procedure: LEFT BREAST LUMPECTOMY X 2  WITH RADIOACTIVE SEED LOCALIZATION;  Surgeon: Jovita Kussmaul, MD;  Location: [facility];  Service: General;  Laterality:  Left;   COLONOSCOPY     DILATION AND CURETTAGE OF [place] ARTHROSCOPY     both in past   POLYPECTOMY     SHOULDER OPEN [place] Left 07/07/2013   Procedure: LEFT SHOULDER MINI OPEN SUBACROMIAL DECOMPRESSION ROTATOR CUFF REPAIR AND POSSIBLE PATCH GRAFT ;  Surgeon: Johnn Hai, MD;  [facility];  Service: Orthopedics;  Laterality: Left;  with interscaline block   SKIN CANCER EXCISION Bilateral    arm, legs, and chest   TONSILLECTOMY      There were no vitals filed for this visit.   Subjective Assessment - 06/18/21 1059     Subjective I still have the cording in my forearm. No present pain but my forearm is still tender/sore to touch. The breast is sore when I do the MLD. Still compliant with MLD. The firmness in breast is 80% better, but I do MLD AM and PM. The flexi touch people are coming to my house on Friday to do the pump trial. The foam at my wrist worked well, but it isn't long enough.    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 She underwent a left lumpectomy for DCIS and a right lumpectomy and sentinel node biopsy on 07/26/2020. She had 2 lymph nodes removed with 1 being positive for cancer on the right side. It is ER/PR positive. Left shoulder rotator cuff repair 06/2013. Last radiation on 01/15/21.  Seroma that was drained x 2-3.    Patient Stated Goals Reduce the numbness/pain, be able to sleep on right side,improve shoulderROM, decrease tightness    Currently in Pain? No/denies    Pain Score 0-No pain   tenderness/soreness   Multiple Pain Sites No                   LYMPHEDEMA/ONCOLOGY QUESTIONNAIRE - 06/18/21 0001       Right Upper Extremity Lymphedema   Other under armpits 98    Other largest part of chest 115 cm                        [facility] Adult PT Treatment/Exercise - 06/18/21 0001       Manual Therapy    Manual Therapy Edema management;Soft tissue mobilization;Myofascial release    Manual therapy comments gave pt another piece of thin foam comprilan to pad at tender area at wrist. Long enought to go around wrist    Soft tissue mobilization supine to right pecs, lats, UT, and forearm and upper arm with cocoa butterd right medial    Myofascial Release to right forearm and upper arm tightness/cording   Manual Lymphatic Drainage (MLD) in supine: short neck, 5 diaphragmatic breaths, left axillary lymph nodes. right inguinal nodes, establishment of axillo inguinal anastomosis and inter axillary anastomosis then Right UE lateral upper arm, medial to lateral, lateral arm to shoulder and retracing pathways, then right forearm both sides retracing all steps and ending with LN's.                          PT Long Term Goals - 06/06/21 1308       PT LONG TERM GOAL #1   Title Pt will have decreased axillary pain/tightness by atleast 50%    Time 6    Period Weeks    Status New    Target Date 06/06/21      PT LONG TERM GOAL #2   Title Pt will be independent in self MLD of right breast to decrease swelling    Time 6    Period Weeks    Status Achieved    Target Date 06/06/21      PT LONG TERM GOAL #3   Title Pt will be able to stretch right shoulder/axillary region with minimal numbness    Baseline 75% better    Time 6    Period Weeks    Status Achieved    Target Date 06/06/21      PT LONG TERM GOAL #4   Title pt will be able to lie on right side without right arm numbness    Time 6    Period Weeks    Status Achieved    Target Date 06/06/21      PT LONG TERM GOAL #5   Title Pt will have decreased forearm and upper arm tightness/tenderness from cording decreased by atleast 50%    Time 2    Period Weeks    Status New    Target Date 06/20/21  Plan - 06/18/21 1154     Clinical Impression Statement Continued MFR techniques to forearm and upper  arm areas of tightness/cording, soft tissue mobilization to right pecs, lats and UT, and MLD to the right UE. She is scheduled for a flexitouch demo on Friday. Chest/bust was measured today. She is very compliant with MLD and notes firmness at right breast 80% better when she does MLD, but returns if she lets up.    Personal Factors and Comorbidities Age;Time since onset of injury/illness/exacerbation;Comorbidity 3+    Comorbidities Bilateral breast CA with Right SLNB, radiation,chemo, FMS,hypothyroidism, left RTC repair, cervical problems mainly on left    Stability/Clinical Decision Making Stable/Uncomplicated    Rehab Potential Good    PT Frequency 2x / week    PT Duration 6 weeks    PT Treatment/Interventions ADLs/Self Care Home Management;Therapeutic exercise;Patient/family education;Neuromuscular re-education;Manual techniques;Manual lymph drainage;Passive range of motion;Taping    PT Next Visit Plan recert next,check right UE swelling/tenderness, check goals, instruct MLD to UE prn,STM to right pectorals especially axillary border and lats, MFR techniques, PROM, progress to right breast MLD, review self MLD - ask how foam worked    PT Home Exercise Plan Self MLD daily, UE stretches    Consulted and Agree with Plan of Care Patient             Patient will benefit from skilled therapeutic intervention in order to improve the following deficits and impairments:  Decreased activity tolerance, Decreased range of motion, Pain, Impaired sensation, Increased edema, Postural dysfunction, Decreased strength  Visit Diagnosis: Aftercare following surgery for neoplasm  Localized edema  Abnormal posture  Stiffness of right shoulder, not elsewhere classified  Muscle weakness (generalized)     Problem List Patient Active Problem List   Diagnosis Date Noted   Malignant neoplasm of upper-outer quadrant of right breast in female, estrogen receptor positive ([place]) 06/25/2020   Ductal  carcinoma in situ (DCIS) of left breast 06/25/2020   Hepatic steatosis 11/27/2014   Hx of adenomatous colonic polyps 11/27/2014   Visit for preventive health examination 03/28/2014   Hyperlipidemia 03/28/2014   Left rotator cuff tear 07/07/2013   Tear of left rotator cuff 07/07/2013   Rhinitis 06/01/2013   Chest wall deformity 04/12/2012   Atrial septal aneurysm / if pfo  echo 6 13  10/23/2011   OSA (obstructive sleep apnea) 11/27/2010   Dyslipidemia 11/01/2010   Flushing 08/29/2010   Labile hypertension 08/29/2010   MUSCLE CRAMPS, FOOT 06/10/2010   OTHER SLEEP DISTURBANCES 06/10/2010   ANXIETY, SITUATIONAL 05/08/2010   VERTIGO, POSITIONAL 03/15/2010   VITAMIN D DEFICIENCY 02/18/2010   OBESITY 02/18/2010   ALLERGIC RHINITIS 02/18/2010   PLANTAR FASCIITIS 02/18/2010   TWITCHING 06/11/2009   NUMBNESS, HAND 06/11/2009   CERVICAL STRAIN, ACUTE 06/11/2009   OTHER MALAISE AND FATIGUE 07/09/2007   HYPERTENSION 01/28/2007   Hypothyroidism 11/11/2006   COMMON MIGRAINE 11/11/2006   GERD 11/11/2006   FIBROMYALGIA 11/11/2006    [name], PT 06/18/2021, 12:01 PM  [facility] @ [place], [place], [address] Phone: [phone]   Fax:  [phone]  Name: [name] MRN: [id number] Date of Birth: 1955/11/03

## 2021-06-19 ENCOUNTER — Telehealth: Payer: Self-pay | Admitting: Internal Medicine

## 2021-06-19 NOTE — Progress Notes (Signed)
°  Chronic Care Management   Outreach Note  06/19/2021 Name: [name] MRN: [id number] DOB: 07-19-1955  Referred by: Burnis Medin, MD Reason for referral : No chief complaint on file.  A second unsuccessful telephone outreach was attempted today. The patient was referred to pharmacist for assistance with care management and care coordination.   Follow Up Plan:   Tatjana Dellinger Upstream Scheduler

## 2021-06-20 ENCOUNTER — Other Ambulatory Visit: Payer: Self-pay

## 2021-06-20 ENCOUNTER — Ambulatory Visit: Payer: Medicare HMO

## 2021-06-20 DIAGNOSIS — R600 Localized edema: Secondary | ICD-10-CM | POA: Diagnosis not present

## 2021-06-20 DIAGNOSIS — Z483 Aftercare following surgery for neoplasm: Secondary | ICD-10-CM

## 2021-06-20 DIAGNOSIS — R293 Abnormal posture: Secondary | ICD-10-CM | POA: Diagnosis not present

## 2021-06-20 DIAGNOSIS — M25611 Stiffness of right shoulder, not elsewhere classified: Secondary | ICD-10-CM | POA: Diagnosis not present

## 2021-06-20 DIAGNOSIS — M6281 Muscle weakness (generalized): Secondary | ICD-10-CM | POA: Diagnosis not present

## 2021-06-20 NOTE — Therapy (Signed)
[facility] @ [address] [place], [place], [address] Phone: [phone]   Fax:  [phone]  Physical Therapy Treatment  Patient Details  Name: CELITA ARON MRN: [id number] Date of Birth: 07-Jun-1955 Referring Provider (PT): Cira Rue   Encounter Date: 06/20/2021   PT End of Session - 06/20/21 1418     Visit Number 10    Number of Visits 18    Date for PT Re-Evaluation 07/18/21    PT Start Time 0347    PT Stop Time 4259    PT Time Calculation (min) 53 min    Activity Tolerance Patient tolerated treatment well    Behavior During Therapy [facility] for tasks assessed/performed             Past Medical History:  Diagnosis Date   Anal fissure    Atrial septal aneurysm / if pfo  echo 6 13  10/23/2011   Breast cancer ([place])    Cataract    both eyes   Depression    Fatty liver    "pre fatty liver"   Fibromyalgia    Headache(784.0)    hx of migraines when younger   Heart palpitations    hx with normal holter event monitoring   Hx: UTI (urinary tract infection)    Hyperlipidemia    Hypertension    Hypothyroidism    Obesity    Personal history of chemotherapy    Personal history of radiation therapy    Pneumonia 1972   hx of   Polyp of colon    Serrated adenoma of colon 08/2012   Skin cancer    basal, squamous cell   Sleep apnea    uses cpap    Past Surgical History:  Procedure Laterality Date   BREAST LUMPECTOMY     BREAST LUMPECTOMY WITH RADIOACTIVE SEED AND SENTINEL LYMPH NODE BIOPSY Right 07/26/2020   Procedure: RIGHT BREAST LUMPECTOMY WITH RADIOACTIVE SEED AND SENTINEL LYMPH NODE BIOPSY;  Surgeon: Jovita Kussmaul, MD;  Location: [place];  Service: General;  Laterality: Right;   BREAST LUMPECTOMY WITH RADIOACTIVE SEED LOCALIZATION Left 07/26/2020   Procedure: LEFT BREAST LUMPECTOMY X 2  WITH RADIOACTIVE SEED LOCALIZATION;  Surgeon: Jovita Kussmaul, MD;  Location: [place];  Service: General;  Laterality:  Left;   COLONOSCOPY     DILATION AND CURETTAGE OF [place] ARTHROSCOPY     both in past   POLYPECTOMY     SHOULDER OPEN [place] Left 07/07/2013   Procedure: LEFT SHOULDER MINI OPEN SUBACROMIAL DECOMPRESSION ROTATOR CUFF REPAIR AND POSSIBLE PATCH GRAFT ;  Surgeon: Johnn Hai, MD;  Location: [facility];  Service: Orthopedics;  Laterality: Left;  with interscaline block   SKIN CANCER EXCISION Bilateral    arm, legs, and chest   TONSILLECTOMY      There were no vitals filed for this visit.   Subjective Assessment - 06/20/21 1406     Subjective My forearm is better and I have been working on it but I still feel the cord.  I can keep the breast pretty supple with the MLD. It always gets firm in the same place but I can work it out.  The arm numbness is much better and when it happens it doesn't linger.  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 She underwent a left lumpectomy for DCIS and a right lumpectomy and sentinel node biopsy on 07/26/2020. She had 2 lymph nodes removed with 1 being positive for cancer on the right side. It is ER/PR positive. Left shoulder rotator cuff repair 06/2013. Last radiation on 01/15/21.  Seroma that was drained x 2-3.    Patient Stated Goals Reduce the numbness/pain, be able to sleep on right side,improve shoulderROM, decrease tightness    Currently in Pain? Yes    Pain Score 2    with movement   Pain Location Axilla    Pain Orientation Right    Pain Descriptors / Indicators Sore    Pain Type Chronic pain    Pain Onset More than a month ago    Pain Frequency Intermittent                               [facility] Adult PT Treatment/Exercise - 06/20/21 0001       Shoulder Exercises: Standing   Other Standing Exercises standing  lat stretch at laundry cart, pec wall stretch x 2 ea      Manual Therapy   Manual Therapy Soft tissue mobilization;Myofascial release    Soft tissue mobilization in supine to R pec and then to L sidelying to R lats, serratus, upper traps and to periscapular area with cocoa butter   Myofascial Release to right forearm and upper arm cords                         PT Long Term Goals - 06/20/21 1413       PT LONG TERM GOAL #1   Title Pt will have decreased axillary pain/tightness by atleast 50%    Time 6    Period Weeks    Status Achieved    Target Date 06/20/21      PT LONG TERM GOAL #2   Title Pt will be independent in self MLD of right breast to decrease swelling    Time 6    Period Weeks    Status Achieved    Target Date 06/06/21      PT LONG TERM GOAL #3   Title Pt will be able to stretch right shoulder/axillary region with minimal numbness    Time 6    Period Weeks    Status Achieved    Target Date 06/06/21      PT LONG TERM GOAL #4   Title pt will be able to lie on right side without right arm numbness    Time 6    Period Weeks    Status Achieved    Target Date 06/06/21      PT LONG TERM GOAL #5   Title Pt will have decreased forearm and upper arm tightness/tenderness from cording decreased by atleast 50%    Time 2    Period Weeks    Status Achieved    Target Date 06/20/21      Additional Long Term Goals   Additional Long Term Goals Yes      PT LONG TERM GOAL #6   Title Pt will have decreased forearm tenderness from cording by atleast 75%    Time 4    Period Weeks    Status New    Target Date 06/20/21  Plan - 06/20/21 1420     Clinical Impression Statement checked goals for recert. Continued MFR techniques to the forearm as well as soft tissue work to [employer], lats, UT and scapular region.  Pt continues with deeper cords in the forearm causing discomfort, and with tightness in the right pecs and lats very  noticeable with manual work. Pt is compliant with wearing compression and with self MLD. foam at wrist is beneficial.  Pt will benefit from skilled therapy to address deficits and return to PLOF    Personal Factors and Comorbidities Age;Time since onset of injury/illness/exacerbation;Comorbidity 3+    Comorbidities Bilateral breast CA with Right SLNB, radiation,chemo, FMS,hypothyroidism, left RTC repair, cervical problems mainly on left    Stability/Clinical Decision Making Stable/Uncomplicated    Rehab Potential Good    PT Frequency 2x / week    PT Duration 4 weeks    PT Treatment/Interventions ADLs/Self Care Home Management;Therapeutic exercise;Patient/family education;Neuromuscular re-education;Manual techniques;Manual lymph drainage;Passive range of motion;Taping    PT Next Visit Plan right UE swelling/tenderness, , instruct MLD to UE prn,STM to right pectorals especially axillary border and lats, MFR techniques, PROM, progress to right breast MLD, review self MLD - ask how foam worked    PT Home Exercise Plan Self MLD daily, UE stretches    Consulted and Agree with Plan of Care Patient             Patient will benefit from skilled therapeutic intervention in order to improve the following deficits and impairments:  Decreased activity tolerance, Decreased range of motion, Pain, Impaired sensation, Increased edema, Postural dysfunction, Decreased strength  Visit Diagnosis: Aftercare following surgery for neoplasm  Localized edema  Abnormal posture  Stiffness of right shoulder, not elsewhere classified  Muscle weakness (generalized)     Problem List Patient Active Problem List   Diagnosis Date Noted   Malignant neoplasm of upper-outer quadrant of right breast in female, estrogen receptor positive ([place]) 06/25/2020   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   Hepatic steatosis 11/27/2014   Hx of adenomatous colonic polyps 11/27/2014   Visit for preventive  health examination 03/28/2014   Hyperlipidemia 03/28/2014   Left rotator cuff tear 07/07/2013   Tear of left rotator cuff 07/07/2013   Rhinitis 06/01/2013   Chest wall deformity 04/12/2012   Atrial septal aneurysm / if pfo  echo 6 13  10/23/2011   OSA (obstructive sleep apnea) 11/27/2010   Dyslipidemia 11/01/2010   Flushing 08/29/2010   Labile hypertension 08/29/2010   MUSCLE CRAMPS, FOOT 06/10/2010   OTHER SLEEP DISTURBANCES 06/10/2010   ANXIETY, SITUATIONAL 05/08/2010   VERTIGO, POSITIONAL 03/15/2010   VITAMIN D DEFICIENCY 02/18/2010   OBESITY 02/18/2010   ALLERGIC RHINITIS 02/18/2010   PLANTAR FASCIITIS 02/18/2010   TWITCHING 06/11/2009   NUMBNESS, HAND 06/11/2009   CERVICAL STRAIN, ACUTE 06/11/2009   OTHER MALAISE AND FATIGUE 07/09/2007   HYPERTENSION 01/28/2007   Hypothyroidism 11/11/2006   COMMON MIGRAINE 11/11/2006   GERD 11/11/2006   FIBROMYALGIA 11/11/2006    Claris Pong, PT 06/20/2021, 3:05 PM  [facility] @ [address] [place], [place], [address] Phone: [phone]   Fax:  [phone]  Name: NABILAH DAVOLI MRN: [id number] Date of Birth: February 19, 1956

## 2021-06-24 DIAGNOSIS — M9901 Segmental and somatic dysfunction of cervical region: Secondary | ICD-10-CM | POA: Diagnosis not present

## 2021-06-24 DIAGNOSIS — M9902 Segmental and somatic dysfunction of thoracic region: Secondary | ICD-10-CM | POA: Diagnosis not present

## 2021-06-24 DIAGNOSIS — M9903 Segmental and somatic dysfunction of lumbar region: Secondary | ICD-10-CM | POA: Diagnosis not present

## 2021-06-24 DIAGNOSIS — M531 Cervicobrachial syndrome: Secondary | ICD-10-CM | POA: Diagnosis not present

## 2021-06-25 ENCOUNTER — Ambulatory Visit: Payer: Medicare HMO

## 2021-06-25 ENCOUNTER — Other Ambulatory Visit: Payer: Self-pay

## 2021-06-25 DIAGNOSIS — R600 Localized edema: Secondary | ICD-10-CM | POA: Diagnosis not present

## 2021-06-25 DIAGNOSIS — Z6834 Body mass index (BMI) 34.0-34.9, adult: Secondary | ICD-10-CM | POA: Diagnosis not present

## 2021-06-25 DIAGNOSIS — M25611 Stiffness of right shoulder, not elsewhere classified: Secondary | ICD-10-CM

## 2021-06-25 DIAGNOSIS — C50911 Malignant neoplasm of unspecified site of right female breast: Secondary | ICD-10-CM | POA: Diagnosis not present

## 2021-06-25 DIAGNOSIS — R293 Abnormal posture: Secondary | ICD-10-CM | POA: Diagnosis not present

## 2021-06-25 DIAGNOSIS — Z01419 Encounter for gynecological examination (general) (routine) without abnormal findings: Secondary | ICD-10-CM | POA: Diagnosis not present

## 2021-06-25 DIAGNOSIS — M6281 Muscle weakness (generalized): Secondary | ICD-10-CM

## 2021-06-25 DIAGNOSIS — D0512 Intraductal carcinoma in situ of left breast: Secondary | ICD-10-CM | POA: Diagnosis not present

## 2021-06-25 DIAGNOSIS — Z483 Aftercare following surgery for neoplasm: Secondary | ICD-10-CM | POA: Diagnosis not present

## 2021-06-25 DIAGNOSIS — Z808 Family history of malignant neoplasm of other organs or systems: Secondary | ICD-10-CM | POA: Diagnosis not present

## 2021-06-25 DIAGNOSIS — Z86010 Personal history of colonic polyps: Secondary | ICD-10-CM | POA: Diagnosis not present

## 2021-06-25 NOTE — Therapy (Signed)
[facility] @ [place], [place], [address] Phone: [phone]   Fax:  [phone]  Physical Therapy Treatment  Patient Details  Name: [name] MRN: [id number] Date of Birth: 03-01-1956 Referring Provider (PT): Cira Rue   Encounter Date: 06/25/2021   PT End of Session - 06/25/21 1119     Visit Number 11    Number of Visits 18    Date for PT Re-Evaluation 07/18/21    PT Start Time 1103    PT Stop Time 1150    PT Time Calculation (min) 47 min    Activity Tolerance Patient tolerated treatment well    Behavior During Therapy [facility] for tasks assessed/performed             Past Medical History:  Diagnosis Date   Anal fissure    Atrial septal aneurysm / if pfo  echo 6 13  10/23/2011   Breast cancer ([facility])    Cataract    both eyes   Depression    Fatty liver    "pre fatty liver"   Fibromyalgia    Headache(784.0)    hx of migraines when younger   Heart palpitations    hx with normal holter event monitoring   Hx: UTI (urinary tract infection)    Hyperlipidemia    Hypertension    Hypothyroidism    Obesity    Personal history of chemotherapy    Personal history of radiation therapy    Pneumonia 1972   hx of   Polyp of colon    Serrated adenoma of colon 08/2012   Skin cancer    basal, squamous cell   Sleep apnea    uses cpap    Past Surgical History:  Procedure Laterality Date   BREAST LUMPECTOMY     BREAST LUMPECTOMY WITH RADIOACTIVE SEED AND SENTINEL LYMPH NODE BIOPSY Right 07/26/2020   Procedure: RIGHT BREAST LUMPECTOMY WITH RADIOACTIVE SEED AND SENTINEL LYMPH NODE BIOPSY;  Surgeon: Jovita Kussmaul, MD;  Location: [place];  Service: General;  Laterality: Right;   BREAST LUMPECTOMY WITH RADIOACTIVE SEED LOCALIZATION Left 07/26/2020   Procedure: LEFT BREAST LUMPECTOMY X 2  WITH RADIOACTIVE SEED LOCALIZATION;  Surgeon: Jovita Kussmaul, MD;  Location: [facility];  Service: General;  Laterality:  Left;   COLONOSCOPY     DILATION AND CURETTAGE OF [place] ARTHROSCOPY     both in past   POLYPECTOMY     SHOULDER OPEN [place] Left 07/07/2013   Procedure: LEFT SHOULDER MINI OPEN SUBACROMIAL DECOMPRESSION ROTATOR CUFF REPAIR AND POSSIBLE PATCH GRAFT ;  Surgeon: Johnn Hai, MD;  [facility];  Service: Orthopedics;  Laterality: Left;  with interscaline block   SKIN CANCER EXCISION Bilateral    arm, legs, and chest   TONSILLECTOMY      There were no vitals filed for this visit.   Subjective Assessment - 06/25/21 1102     Subjective I brought the patient assistance application so you can fax it.  I have been wearing the sleeve all day except Sunday.  I think the tenderness in my forearm is better. I can do the chest stretch better now. I am doing the MLD but swelling is still presnt in right breast and arm.   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 She underwent a left lumpectomy for  DCIS and a right lumpectomy and sentinel node biopsy on 07/26/2020. She had 2 lymph nodes removed with 1 being positive for cancer on the right side. It is ER/PR positive. Left shoulder rotator cuff repair 06/2013. Last radiation on 01/15/21.  Seroma that was drained x 2-3.    Patient Stated Goals Reduce the numbness/pain, be able to sleep on right side,improve shoulderROM, decrease tightness    Pain Score 2     Pain Location Axilla    Pain Orientation Right    Pain Descriptors / Indicators Sore    Pain Onset More than a month ago    Multiple Pain Sites No                   LYMPHEDEMA/ONCOLOGY QUESTIONNAIRE - 06/25/21 0001       Right Upper Extremity Lymphedema   15 cm Proximal to Olecranon Process 35.4 cm    10 cm Proximal to Olecranon Process 33.2 cm    Olecranon Process 26.9 cm    15 cm Proximal to Ulnar Styloid Process 27.7 cm    10 cm Proximal to  Ulnar Styloid Process 25.2 cm    Just Proximal to Ulnar Styloid Process 17.9 cm    At Base of 2nd Digit 6.4 cm                        [facility] Adult PT Treatment/Exercise - 06/25/21 0001       Knee/Hip Exercises: Supine   Other Supine Knee/Hip Exercises lower trunk rotation with right shoulder in ER(goal post position x 2 to stretch trunk and right chest      Manual Therapy   Edema Management pt remeasured and faxed PAC to [employer] for pt.    Soft tissue mobilization Supine to right pectorals. lats and UT    Myofascial Release to right forearm and upper arm areas of cording                          PT Long Term Goals - 06/20/21 1413       PT LONG TERM GOAL #1   Title Pt will have decreased axillary pain/tightness by atleast 50%    Time 6    Period Weeks    Status Achieved    Target Date 06/20/21      PT LONG TERM GOAL #2   Title Pt will be independent in self MLD of right breast to decrease swelling    Time 6    Period Weeks    Status Achieved    Target Date 06/06/21      PT LONG TERM GOAL #3   Title Pt will be able to stretch right shoulder/axillary region with minimal numbness    Time 6    Period Weeks    Status Achieved    Target Date 06/06/21      PT LONG TERM GOAL #4   Title pt will be able to lie on right side without right arm numbness    Time 6    Period Weeks    Status Achieved    Target Date 06/06/21      PT LONG TERM GOAL #5   Title Pt will have decreased forearm and upper arm tightness/tenderness from cording decreased by atleast 50%    Time 2    Period Weeks    Status Achieved    Target Date 06/20/21      Additional  Long Term Goals   Additional Long Term Goals Yes      PT LONG TERM GOAL #6   Title Pt will have decreased forearm tenderness from cording by atleast 75%    Time 4    Period Weeks    Status New    Target Date 06/20/21                   Plan - 06/25/21 1216     Clinical Impression  Statement Pt presents with right breast/chest lymphedema and new onset of mild right UE lymphedema.  She has mild hyperpigmentation and has had  greater than 4 weeks of conservative therapies including compression, elevation and exercise and MLD.  She is compliant with her home exercise program and self manual lymph drainage.  Despite this lymphedema persists.  She tried an [place] basic pump and failed secondary to pain.  Flexitouch with arm and chest components is medically necessary to reduce/alleviate breast and upper arm swelling and to prevent further increases in swelling which could lead to infection, hospitalization and overall decreased function.  She continues to require skilled therapy to assist with control of cording soft tissue restrictions and swelling.  Cording is still present in right axillary region, upper arm and forearm, and is overall improved.    Personal Factors and Comorbidities Age;Time since onset of injury/illness/exacerbation;Comorbidity 3+    Comorbidities Bilateral breast CA with Right SLNB, radiation,chemo, FMS,hypothyroidism, left RTC repair, cervical problems mainly on left    Stability/Clinical Decision Making Stable/Uncomplicated    Rehab Potential Good    PT Frequency 2x / week    PT Duration 4 weeks    PT Treatment/Interventions ADLs/Self Care Home Management;Therapeutic exercise;Patient/family education;Neuromuscular re-education;Manual techniques;Manual lymph drainage;Passive range of motion;Taping    PT Next Visit Plan right UE swelling/tenderness, , instruct MLD to UE prn,STM to right pectorals especially axillary border and lats, MFR techniques, PROM, progress to right breast MLD, review self MLD - ask how foam worked    PT Home Exercise Plan Self MLD daily, UE stretches    Consulted and Agree with Plan of Care Patient             Patient will benefit from skilled therapeutic intervention in order to improve the following deficits and impairments:   Decreased activity tolerance, Decreased range of motion, Pain, Impaired sensation, Increased edema, Postural dysfunction, Decreased strength  Visit Diagnosis: Aftercare following surgery for neoplasm  Localized edema  Abnormal posture  Stiffness of right shoulder, not elsewhere classified  Muscle weakness (generalized)     Problem List Patient Active Problem List   Diagnosis Date Noted   Malignant neoplasm of upper-outer quadrant of right breast in female, estrogen receptor positive ([place]) 06/25/2020   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   Hepatic steatosis 11/27/2014   Hx of adenomatous colonic polyps 11/27/2014   Visit for preventive health examination 03/28/2014   Hyperlipidemia 03/28/2014   Left rotator cuff tear 07/07/2013   Tear of left rotator cuff 07/07/2013   Rhinitis 06/01/2013   Chest wall deformity 04/12/2012   Atrial septal aneurysm / if pfo  echo 6 13  10/23/2011   OSA (obstructive sleep apnea) 11/27/2010   Dyslipidemia 11/01/2010   Flushing 08/29/2010   Labile hypertension 08/29/2010   MUSCLE CRAMPS, FOOT 06/10/2010   OTHER SLEEP DISTURBANCES 06/10/2010   ANXIETY, SITUATIONAL 05/08/2010   VERTIGO, POSITIONAL 03/15/2010   VITAMIN D DEFICIENCY 02/18/2010   OBESITY 02/18/2010   ALLERGIC RHINITIS  02/18/2010   PLANTAR FASCIITIS 02/18/2010   TWITCHING 06/11/2009   NUMBNESS, HAND 06/11/2009   CERVICAL STRAIN, ACUTE 06/11/2009   OTHER MALAISE AND FATIGUE 07/09/2007   HYPERTENSION 01/28/2007   Hypothyroidism 11/11/2006   COMMON MIGRAINE 11/11/2006   GERD 11/11/2006   FIBROMYALGIA 11/11/2006    Claris Pong, PT 06/25/2021, 12:23 PM  [facility] @ [place], [place], [address] Phone: [phone]   Fax:  [phone]  Name: KAILIE POLUS MRN: [id number] Date of Birth: [birth year]

## 2021-06-26 ENCOUNTER — Telehealth: Payer: Self-pay | Admitting: Internal Medicine

## 2021-06-26 NOTE — Chronic Care Management (AMB) (Signed)
°  Chronic Care Management   Note  06/26/2021 Name: TAMMELA BALES MRN: [id number] DOB: 1955-08-01  KELVIN BURPEE is a 66 y.o. year old female who is a primary care patient of Panosh, Standley Brooking, MD. I reached out to Jacklynn Ganong by phone today in response to a referral sent by Ms. Romie Levee Konecny's PCP, Panosh, Standley Brooking, MD.   Ms. Hypolite was given information about Chronic Care Management services today including:  CCM service includes personalized support from designated clinical staff supervised by her physician, including individualized plan of care and coordination with other care providers 24/7 contact phone numbers for assistance for urgent and routine care needs. Service will only be billed when office clinical staff spend 20 minutes or more in a month to coordinate care. Only one practitioner may furnish and bill the service in a calendar month. The patient may stop CCM services at any time (effective at the end of the month) by phone call to the office staff.   Patient agreed to services and verbal consent obtained.   Follow up plan:   Tatjana Secretary/administrator

## 2021-06-26 NOTE — Chronic Care Management (AMB) (Signed)
°  Chronic Care Management   Outreach Note  06/26/2021 Name: [name] MRN: [id number] DOB: 1955-08-23  Referred by: Burnis Medin, MD Reason for referral : No chief complaint on file.   Third unsuccessful telephone outreach was attempted today. The patient was referred to the pharmacist for assistance with care management and care coordination.   Follow Up Plan:            Tatjana Dellinger Upstream Scheduler

## 2021-06-27 ENCOUNTER — Ambulatory Visit: Payer: Medicare HMO

## 2021-07-01 DIAGNOSIS — I890 Lymphedema, not elsewhere classified: Secondary | ICD-10-CM | POA: Diagnosis not present

## 2021-07-02 ENCOUNTER — Ambulatory Visit: Payer: Medicare HMO

## 2021-07-02 ENCOUNTER — Other Ambulatory Visit: Payer: Self-pay

## 2021-07-02 DIAGNOSIS — Z483 Aftercare following surgery for neoplasm: Secondary | ICD-10-CM

## 2021-07-02 DIAGNOSIS — R293 Abnormal posture: Secondary | ICD-10-CM

## 2021-07-02 DIAGNOSIS — R600 Localized edema: Secondary | ICD-10-CM

## 2021-07-02 DIAGNOSIS — M6281 Muscle weakness (generalized): Secondary | ICD-10-CM

## 2021-07-02 DIAGNOSIS — M25611 Stiffness of right shoulder, not elsewhere classified: Secondary | ICD-10-CM | POA: Diagnosis not present

## 2021-07-02 NOTE — Therapy (Signed)
[facility] @ [place], [place], [address] Phone: [phone]   Fax:  [phone]  Physical Therapy Treatment  Patient Details  Name: [name] MRN: [id number] Date of Birth: 30-Dec-1955 Referring Provider (PT): Cira Rue   Encounter Date: 07/02/2021   PT End of Session - 07/02/21 1356     Visit Number 12    Number of Visits 18    Date for PT Re-Evaluation 07/18/21    PT Start Time 1400    PT Stop Time 1449    PT Time Calculation (min) 49 min    Activity Tolerance Patient tolerated treatment well    Behavior During Therapy [facility] for tasks assessed/performed             Past Medical History:  Diagnosis Date   Anal fissure    Atrial septal aneurysm / if pfo  echo 6 13  10/23/2011   Breast cancer ([place])    Cataract    both eyes   Depression    Fatty liver    "pre fatty liver"   Fibromyalgia    Headache(784.0)    hx of migraines when younger   Heart palpitations    hx with normal holter event monitoring   Hx: UTI (urinary tract infection)    Hyperlipidemia    Hypertension    Hypothyroidism    Obesity    Personal history of chemotherapy    Personal history of radiation therapy    Pneumonia 1972   hx of   Polyp of colon    Serrated adenoma of colon 08/2012   Skin cancer    basal, squamous cell   Sleep apnea    uses cpap    Past Surgical History:  Procedure Laterality Date   BREAST LUMPECTOMY     BREAST LUMPECTOMY WITH RADIOACTIVE SEED AND SENTINEL LYMPH NODE BIOPSY Right 07/26/2020   Procedure: RIGHT BREAST LUMPECTOMY WITH RADIOACTIVE SEED AND SENTINEL LYMPH NODE BIOPSY;  Surgeon: Jovita Kussmaul, MD;  Location: [facility];  Service: General;  Laterality: Right;   BREAST LUMPECTOMY WITH RADIOACTIVE SEED LOCALIZATION Left 07/26/2020   Procedure: LEFT BREAST LUMPECTOMY X 2  WITH RADIOACTIVE SEED LOCALIZATION;  Surgeon: Jovita Kussmaul, MD;  Location: [facility];  Service: General;  Laterality:  Left;   COLONOSCOPY     DILATION AND CURETTAGE OF [place] ARTHROSCOPY     both in past   POLYPECTOMY     SHOULDER OPEN [place] Left 07/07/2013   Procedure: LEFT SHOULDER MINI OPEN SUBACROMIAL DECOMPRESSION ROTATOR CUFF REPAIR AND POSSIBLE PATCH GRAFT ;  Surgeon: Johnn Hai, MD;  Location: [facility];  Service: Orthopedics;  Laterality: Left;  with interscaline block   SKIN CANCER EXCISION Bilateral    arm, legs, and chest   TONSILLECTOMY      There were no vitals filed for this visit.   Subjective Assessment - 07/02/21 1358     Subjective Megan called yesterday and it is 100% covered and it has been shipped already. I have been wearing the sleeve and gauntlet and I think the cording in the forearm is better, but I feel a little at the axilla. The forearm isn't tender like it was.    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 She underwent a left lumpectomy for DCIS and a right lumpectomy and sentinel  node biopsy on 07/26/2020. She had 2 lymph nodes removed with 1 being positive for cancer on the right side. It is ER/PR positive. Left shoulder rotator cuff repair 06/2013. Last radiation on 01/15/21.  Seroma that was drained x 2-3.    Patient Stated Goals Reduce the numbness/pain, be able to sleep on right side,improve shoulderROM, decrease tightness    Currently in Pain? No/denies    Pain Score 0-No pain   tender to touch.                              [facility] Adult PT Treatment/Exercise - 07/02/21 0001       Manual Therapy   Manual Therapy Soft tissue mobilization;Myofascial release;Manual Lymphatic Drainage (MLD);Passive ROM    Soft tissue mobilization Supine to right pectorals. lats and UT    Myofascial Release to right forearm and upper arm areas of cording    Manual Lymphatic Drainage (MLD) in supine: short neck, 5 diaphragmatic  breaths, left axillary lymph nodes. right inguinal nodes, establishment of axillo inguinal anastomosis and inter axillary anastomosis then Right UE lateral upper arm, medial to lateral, lateral arm to shoulder and retracing pathways, then right forearm both sides retracing all steps and ending with LN's.                          PT Long Term Goals - 06/20/21 1413       PT LONG TERM GOAL #1   Title Pt will have decreased axillary pain/tightness by atleast 50%    Time 6    Period Weeks    Status Achieved    Target Date 06/20/21      PT LONG TERM GOAL #2   Title Pt will be independent in self MLD of right breast to decrease swelling    Time 6    Period Weeks    Status Achieved    Target Date 06/06/21      PT LONG TERM GOAL #3   Title Pt will be able to stretch right shoulder/axillary region with minimal numbness    Time 6    Period Weeks    Status Achieved    Target Date 06/06/21      PT LONG TERM GOAL #4   Title pt will be able to lie on right side without right arm numbness    Time 6    Period Weeks    Status Achieved    Target Date 06/06/21      PT LONG TERM GOAL #5   Title Pt will have decreased forearm and upper arm tightness/tenderness from cording decreased by atleast 50%    Time 2    Period Weeks    Status Achieved    Target Date 06/20/21      Additional Long Term Goals   Additional Long Term Goals Yes      PT LONG TERM GOAL #6   Title Pt will have decreased forearm tenderness from cording by atleast 75%    Time 4    Period Weeks    Status New    Target Date 06/20/21                   Plan - 07/02/21 1357     Clinical Impression Statement Continued soft tissue mobilization, MFR to cording and MLD techniques to right UE. 1 tight cord in upper arm popped, and remainder loosened up  with therapy.  pts Flexi touch has been shipped.  Her tenderness is much better overall.  She may be ready for DC next week.    Personal Factors and  Comorbidities Age;Time since onset of injury/illness/exacerbation;Comorbidity 3+    Comorbidities Bilateral breast CA with Right SLNB, radiation,chemo, FMS,hypothyroidism, left RTC repair, cervical problems mainly on left    Stability/Clinical Decision Making Stable/Uncomplicated    Rehab Potential Good    PT Frequency 2x / week    PT Duration 4 weeks    PT Treatment/Interventions ADLs/Self Care Home Management;Therapeutic exercise;Patient/family education;Neuromuscular re-education;Manual techniques;Manual lymph drainage;Passive range of motion;Taping    PT Next Visit Plan Ready for DC?, reassess, MFR to cording prn, STM prn, on hold or DC(Flexitouch has shipped    PT Home Exercise Plan Self MLD daily, UE stretches    Consulted and Agree with Plan of Care Patient             Patient will benefit from skilled therapeutic intervention in order to improve the following deficits and impairments:  Decreased activity tolerance, Decreased range of motion, Pain, Impaired sensation, Increased edema, Postural dysfunction, Decreased strength  Visit Diagnosis: Aftercare following surgery for neoplasm  Localized edema  Abnormal posture  Muscle weakness (generalized)  Stiffness of right shoulder, not elsewhere classified     Problem List Patient Active Problem List   Diagnosis Date Noted   Malignant neoplasm of upper-outer quadrant of right breast in female, estrogen receptor positive ([place]) 06/25/2020   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   Hepatic steatosis 11/27/2014   Hx of adenomatous colonic polyps 11/27/2014   Visit for preventive health examination 03/28/2014   Hyperlipidemia 03/28/2014   Left rotator cuff tear 07/07/2013   Tear of left rotator cuff 07/07/2013   Rhinitis 06/01/2013   Chest wall deformity 04/12/2012   Atrial septal aneurysm / if pfo  echo 6 13  10/23/2011   OSA (obstructive sleep apnea) 11/27/2010   Dyslipidemia 11/01/2010   Flushing  08/29/2010   Labile hypertension 08/29/2010   MUSCLE CRAMPS, FOOT 06/10/2010   OTHER SLEEP DISTURBANCES 06/10/2010   ANXIETY, SITUATIONAL 05/08/2010   VERTIGO, POSITIONAL 03/15/2010   VITAMIN D DEFICIENCY 02/18/2010   OBESITY 02/18/2010   ALLERGIC RHINITIS 02/18/2010   PLANTAR FASCIITIS 02/18/2010   TWITCHING 06/11/2009   NUMBNESS, HAND 06/11/2009   CERVICAL STRAIN, ACUTE 06/11/2009   OTHER MALAISE AND FATIGUE 07/09/2007   HYPERTENSION 01/28/2007   Hypothyroidism 11/11/2006   COMMON MIGRAINE 11/11/2006   GERD 11/11/2006   FIBROMYALGIA 11/11/2006    Claris Pong, PT 07/02/2021, 2:59 PM  [facility] @ [place], [place], [address] Phone: [phone]   Fax:  [phone]  Name: [name] MRN: [id number] Date of Birth: February 22, 1956

## 2021-07-04 DIAGNOSIS — E049 Nontoxic goiter, unspecified: Secondary | ICD-10-CM | POA: Diagnosis not present

## 2021-07-04 DIAGNOSIS — E042 Nontoxic multinodular goiter: Secondary | ICD-10-CM | POA: Diagnosis not present

## 2021-07-09 ENCOUNTER — Ambulatory Visit: Payer: Medicare HMO

## 2021-07-09 ENCOUNTER — Other Ambulatory Visit: Payer: Self-pay

## 2021-07-09 DIAGNOSIS — R293 Abnormal posture: Secondary | ICD-10-CM

## 2021-07-09 DIAGNOSIS — R600 Localized edema: Secondary | ICD-10-CM

## 2021-07-09 DIAGNOSIS — M6281 Muscle weakness (generalized): Secondary | ICD-10-CM

## 2021-07-09 DIAGNOSIS — Z483 Aftercare following surgery for neoplasm: Secondary | ICD-10-CM | POA: Diagnosis not present

## 2021-07-09 DIAGNOSIS — M25611 Stiffness of right shoulder, not elsewhere classified: Secondary | ICD-10-CM | POA: Diagnosis not present

## 2021-07-09 NOTE — Therapy (Signed)
[facility] @ [place], [place], [address] Phone: [phone]   Fax:  [phone]  Physical Therapy Treatment  Patient Details  Name: [name] MRN: [id number] Date of Birth: [birth year] Referring Provider (PT): Cira Rue   Encounter Date: 07/09/2021   PT End of Session - 07/09/21 1456     Visit Number 13    Number of Visits 18    Date for PT Re-Evaluation 07/18/21    PT Start Time 1500    PT Stop Time 7371    PT Time Calculation (min) 57 min    Activity Tolerance Patient tolerated treatment well    Behavior During Therapy [facility] for tasks assessed/performed             Past Medical History:  Diagnosis Date   Anal fissure    Atrial septal aneurysm / if pfo  echo 6 13  10/23/2011   Breast cancer ([place])    Cataract    both eyes   Depression    Fatty liver    "pre fatty liver"   Fibromyalgia    Headache(784.0)    hx of migraines when younger   Heart palpitations    hx with normal holter event monitoring   Hx: UTI (urinary tract infection)    Hyperlipidemia    Hypertension    Hypothyroidism    Obesity    Personal history of chemotherapy    Personal history of radiation therapy    Pneumonia 1972   hx of   Polyp of colon    Serrated adenoma of colon 08/2012   Skin cancer    basal, squamous cell   Sleep apnea    uses cpap    Past Surgical History:  Procedure Laterality Date   BREAST LUMPECTOMY     BREAST LUMPECTOMY WITH RADIOACTIVE SEED AND SENTINEL LYMPH NODE BIOPSY Right 07/26/2020   Procedure: RIGHT BREAST LUMPECTOMY WITH RADIOACTIVE SEED AND SENTINEL LYMPH NODE BIOPSY;  Surgeon: Jovita Kussmaul, MD;  Location: [facility];  Service: General;  Laterality: Right;   BREAST LUMPECTOMY WITH RADIOACTIVE SEED LOCALIZATION Left 07/26/2020   Procedure: LEFT BREAST LUMPECTOMY X 2  WITH RADIOACTIVE SEED LOCALIZATION;  Surgeon: Jovita Kussmaul, MD;  Location: [facility];  Service: General;  Laterality:  Left;   COLONOSCOPY     DILATION AND CURETTAGE OF [place] ARTHROSCOPY     both in past   POLYPECTOMY     SHOULDER OPEN [place] Left 07/07/2013   Procedure: LEFT SHOULDER MINI OPEN SUBACROMIAL DECOMPRESSION ROTATOR CUFF REPAIR AND POSSIBLE PATCH GRAFT ;  Surgeon: Johnn Hai, MD;  [facility];  Service: Orthopedics;  Laterality: Left;  with interscaline block   SKIN CANCER EXCISION Bilateral    arm, legs, and chest   TONSILLECTOMY      There were no vitals filed for this visit.   Subjective Assessment - 07/09/21 1457     Subjective I got my flexi and they will come next Tuesday to set it up for me.  Forearm seems to be doing OK, but just above my elbow is sore . 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 She underwent a left lumpectomy for DCIS and a right lumpectomy and sentinel node biopsy on 07/26/2020. She  had 2 lymph nodes removed with 1 being positive for cancer on the right side. It is ER/PR positive. Left shoulder rotator cuff repair 06/2013. Last radiation on 01/15/21.  Seroma that was drained x 2-3.    Patient Stated Goals Reduce the numbness/pain, be able to sleep on right side,improve shoulderROM, decrease tightness                               [facility] Adult PT Treatment/Exercise - 07/09/21 0001       Shoulder Exercises: Supine   Other Supine Exercises AROM flexion, scaption, horizontal abd x 3, and with towel roll between scapula and horizontal abd x 3     Shoulder Exercises: Standing   Other Standing Exercises      Manual Therapy   Manual Therapy Soft tissue mobilization;Myofascial release;Passive ROM    Edema Management small peach foam added in right side of bra to cover small fibrotic area at inferior breast    Soft tissue mobilization Supine to right  pectorals. lats and bilateral UT    Myofascial Release assessed right forearm and upper arm for cords.  none found    Passive ROM PROM right shoulder flexion, scaption, abd                         PT Long Term Goals - 06/20/21 1413       PT LONG TERM GOAL #1   Title Pt will have decreased axillary pain/tightness by atleast 50%    Time 6    Period Weeks    Status Achieved    Target Date 06/20/21      PT LONG TERM GOAL #2   Title Pt will be independent in self MLD of right breast to decrease swelling    Time 6    Period Weeks    Status Achieved    Target Date 06/06/21      PT LONG TERM GOAL #3   Title Pt will be able to stretch right shoulder/axillary region with minimal numbness    Time 6    Period Weeks    Status Achieved    Target Date 06/06/21      PT LONG TERM GOAL #4   Title pt will be able to lie on right side without right arm numbness    Time 6    Period Weeks    Status Achieved    Target Date 06/06/21      PT LONG TERM GOAL #5   Title Pt will have decreased forearm and upper arm tightness/tenderness from cording decreased by atleast 50%    Time 2    Period Weeks    Status Achieved    Target Date 06/20/21      Additional Long Term Goals   Additional Long Term Goals Yes      PT LONG TERM GOAL #6   Title Pt will have decreased forearm tenderness from cording by atleast 75%    Time 4    Period Weeks    Status New    Target Date 06/20/21                   Plan - 07/09/21 1605     Clinical Impression Statement Small piece of peach foam added to bra for right inferior breast fibrosis, assessed right UE for cording with no cords evident today.  Performed soft tissue mobilization and PROM  at pt request and AROM of bilateral shoulders for chest stretch at end ROM.Marland Kitchen  small piece of comprilan foam made for right wrist under sleeve, and pt shown proper height to don sleeve. Pt with continued tenderness to pectorals and UT's. Pt reviews  flexi touch with Lelan Pons next Tuesday before PT.May be ready for DC next week    Personal Factors and Comorbidities Age;Time since onset of injury/illness/exacerbation;Comorbidity 3+    Comorbidities Bilateral breast CA with Right SLNB, radiation,chemo, FMS,hypothyroidism, left RTC repair, cervical problems mainly on left    Stability/Clinical Decision Making Stable/Uncomplicated    Rehab Potential Good    PT Frequency 2x / week    PT Duration 4 weeks    PT Treatment/Interventions ADLs/Self Care Home Management;Therapeutic exercise;Patient/family education;Neuromuscular re-education;Manual techniques;Manual lymph drainage;Passive range of motion;Taping;Vasopneumatic Device    PT Next Visit Plan Ready for DC, reassess, MFR to cording prn, STM prn, on hold or DC(Flexitouch has shipped    PT Home Exercise Plan Self MLD daily, UE stretches    Consulted and Agree with Plan of Care Patient             Patient will benefit from skilled therapeutic intervention in order to improve the following deficits and impairments:  Decreased activity tolerance, Decreased range of motion, Pain, Impaired sensation, Increased edema, Postural dysfunction, Decreased strength  Visit Diagnosis: Aftercare following surgery for neoplasm  Localized edema  Abnormal posture  Muscle weakness (generalized)  Stiffness of right shoulder, not elsewhere classified     Problem List Patient Active Problem List   Diagnosis Date Noted   Malignant neoplasm of upper-outer quadrant of right breast in female, estrogen receptor positive ([place]) 06/25/2020   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   Hepatic steatosis 11/27/2014   Hx of adenomatous colonic polyps 11/27/2014   Visit for preventive health examination 03/28/2014   Hyperlipidemia 03/28/2014   Left rotator cuff tear 07/07/2013   Tear of left rotator cuff 07/07/2013   Rhinitis 06/01/2013   Chest wall deformity 04/12/2012   Atrial septal  aneurysm / if pfo  echo 6 13  10/23/2011   OSA (obstructive sleep apnea) 11/27/2010   Dyslipidemia 11/01/2010   Flushing 08/29/2010   Labile hypertension 08/29/2010   MUSCLE CRAMPS, FOOT 06/10/2010   OTHER SLEEP DISTURBANCES 06/10/2010   ANXIETY, SITUATIONAL 05/08/2010   VERTIGO, POSITIONAL 03/15/2010   VITAMIN D DEFICIENCY 02/18/2010   OBESITY 02/18/2010   ALLERGIC RHINITIS 02/18/2010   PLANTAR FASCIITIS 02/18/2010   TWITCHING 06/11/2009   NUMBNESS, HAND 06/11/2009   CERVICAL STRAIN, ACUTE 06/11/2009   OTHER MALAISE AND FATIGUE 07/09/2007   HYPERTENSION 01/28/2007   Hypothyroidism 11/11/2006   COMMON MIGRAINE 11/11/2006   GERD 11/11/2006   FIBROMYALGIA 11/11/2006    Claris Pong, PT 07/09/2021, 4:10 PM  [facility] @ [place], [place], [address] Phone: [phone]   Fax:  [phone]  Name: [name] MRN: [id number] Date of Birth: 1955/06/20

## 2021-07-10 ENCOUNTER — Inpatient Hospital Stay: Payer: Medicare HMO | Attending: Hematology

## 2021-07-10 ENCOUNTER — Encounter: Payer: Self-pay | Admitting: Hematology

## 2021-07-10 ENCOUNTER — Inpatient Hospital Stay: Payer: Medicare HMO | Admitting: Hematology

## 2021-07-10 VITALS — BP 150/76 | HR 83 | Temp 98.7°F | Resp 18 | Ht 60.0 in | Wt 211.9 lb

## 2021-07-10 DIAGNOSIS — Z79899 Other long term (current) drug therapy: Secondary | ICD-10-CM | POA: Insufficient documentation

## 2021-07-10 DIAGNOSIS — K760 Fatty (change of) liver, not elsewhere classified: Secondary | ICD-10-CM | POA: Insufficient documentation

## 2021-07-10 DIAGNOSIS — C50411 Malignant neoplasm of upper-outer quadrant of right female breast: Secondary | ICD-10-CM | POA: Diagnosis not present

## 2021-07-10 DIAGNOSIS — D0512 Intraductal carcinoma in situ of left breast: Secondary | ICD-10-CM | POA: Diagnosis not present

## 2021-07-10 DIAGNOSIS — M8580 Other specified disorders of bone density and structure, unspecified site: Secondary | ICD-10-CM | POA: Insufficient documentation

## 2021-07-10 DIAGNOSIS — I10 Essential (primary) hypertension: Secondary | ICD-10-CM | POA: Insufficient documentation

## 2021-07-10 DIAGNOSIS — Z79811 Long term (current) use of aromatase inhibitors: Secondary | ICD-10-CM | POA: Diagnosis not present

## 2021-07-10 DIAGNOSIS — M797 Fibromyalgia: Secondary | ICD-10-CM | POA: Diagnosis not present

## 2021-07-10 DIAGNOSIS — R7401 Elevation of levels of liver transaminase levels: Secondary | ICD-10-CM | POA: Diagnosis not present

## 2021-07-10 DIAGNOSIS — Z170 Estrogen receptor positive status [ER+]: Secondary | ICD-10-CM | POA: Diagnosis not present

## 2021-07-10 DIAGNOSIS — Z923 Personal history of irradiation: Secondary | ICD-10-CM | POA: Diagnosis not present

## 2021-07-10 DIAGNOSIS — R609 Edema, unspecified: Secondary | ICD-10-CM | POA: Insufficient documentation

## 2021-07-10 DIAGNOSIS — E785 Hyperlipidemia, unspecified: Secondary | ICD-10-CM | POA: Insufficient documentation

## 2021-07-10 DIAGNOSIS — Z8719 Personal history of other diseases of the digestive system: Secondary | ICD-10-CM | POA: Diagnosis not present

## 2021-07-10 DIAGNOSIS — R5383 Other fatigue: Secondary | ICD-10-CM | POA: Insufficient documentation

## 2021-07-10 DIAGNOSIS — E039 Hypothyroidism, unspecified: Secondary | ICD-10-CM | POA: Diagnosis not present

## 2021-07-10 DIAGNOSIS — Z9221 Personal history of antineoplastic chemotherapy: Secondary | ICD-10-CM | POA: Insufficient documentation

## 2021-07-10 DIAGNOSIS — G4730 Sleep apnea, unspecified: Secondary | ICD-10-CM | POA: Diagnosis not present

## 2021-07-10 LAB — CBC WITH DIFFERENTIAL (CANCER CENTER ONLY)
Abs Immature Granulocytes: 0.01 10*3/uL (ref 0.00–0.07)
Basophils Absolute: 0 10*3/uL (ref 0.0–0.1)
Basophils Relative: 1 %
Eosinophils Absolute: 0.2 10*3/uL (ref 0.0–0.5)
Eosinophils Relative: 3 %
HCT: 39.6 % (ref 36.0–46.0)
Hemoglobin: 13.1 g/dL (ref 12.0–15.0)
Immature Granulocytes: 0 %
Lymphocytes Relative: 27 %
Lymphs Abs: 1.6 10*3/uL (ref 0.7–4.0)
MCH: 29.8 pg (ref 26.0–34.0)
MCHC: 33.1 g/dL (ref 30.0–36.0)
MCV: 90.2 fL (ref 80.0–100.0)
Monocytes Absolute: 0.5 10*3/uL (ref 0.1–1.0)
Monocytes Relative: 9 %
Neutro Abs: 3.6 10*3/uL (ref 1.7–7.7)
Neutrophils Relative %: 60 %
Platelet Count: 194 10*3/uL (ref 150–400)
RBC: 4.39 MIL/uL (ref 3.87–5.11)
RDW: 14.2 % (ref 11.5–15.5)
WBC Count: 5.8 10*3/uL (ref 4.0–10.5)
nRBC: 0 % (ref 0.0–0.2)

## 2021-07-10 LAB — CMP (CANCER CENTER ONLY)
ALT: 30 U/L (ref 0–44)
AST: 26 U/L (ref 15–41)
Albumin: 4.3 g/dL (ref 3.5–5.0)
Alkaline Phosphatase: 101 U/L (ref 38–126)
Anion gap: 6 (ref 5–15)
BUN: 23 mg/dL (ref 8–23)
CO2: 30 mmol/L (ref 22–32)
Calcium: 9.7 mg/dL (ref 8.9–10.3)
Chloride: 105 mmol/L (ref 98–111)
Creatinine: 0.78 mg/dL (ref 0.44–1.00)
GFR, Estimated: >60 mL/min (ref >60)
Glucose, Bld: 89 mg/dL (ref 70–99)
Potassium: 3.9 mmol/L (ref 3.5–5.1)
Sodium: 141 mmol/L (ref 135–145)
Total Bilirubin: 0.6 mg/dL (ref 0.3–1.2)
Total Protein: 7.1 g/dL (ref 6.5–8.1)

## 2021-07-10 NOTE — Progress Notes (Signed)
[place]   [phone] [phone]   Clinic Follow up Note   Patient Care Team: Panosh, Standley Brooking, MD as PCP - General Arvella Nigh, MD as Consulting Physician (Obstetrics and Gynecology) Dr Peggye Form, MD as Consulting Physician (Orthopedic Surgery) Ladene Artist, MD as Consulting Physician (Gastroenterology) Melina Schools, MD as Consulting Physician (Orthopedic Surgery) Rockwell Germany, RN as Oncology Nurse Navigator Mauro Kaufmann, RN as Oncology Nurse Navigator Jovita Kussmaul, MD as Consulting Physician (General Surgery) Truitt Merle, MD as Consulting Physician (Hematology) Eppie Gibson, MD as Attending Physician (Radiation Oncology) Jari Pigg, MD as Consulting Physician (Dermatology) Serita Kyle, MD as Referring Physician (Cardiology) Alla Feeling, NP as Nurse Practitioner (Nurse Practitioner) Viona Gilmore, [facility] as Pharmacist (Pharmacist)  Date of Service:  07/10/2021  CHIEF COMPLAINT: f/u of right breast cancer, left breast DCIS  CURRENT THERAPY:  Exemestane started 02/07/21  ASSESSMENT & PLAN:  [name] is a 66 y.o. female with   1. 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 Her surgical path showed: left breast with no residual DCIS on path; right breast with grade II, 1.5cm invasive ductal carcinoma, with clear margins and 1/2 positive LNs. -Mammaprint was done and results show high risk of Luminal Type B with 29% risk of distant recurrence.  -She completed adjuvant chemo with TC.  Due to allergy reaction to docetaxel, it was changed to Abraxane from cycle 3. -She received adjuvant radiation under Dr. Isidore Moos 7/25-01/15/21. -she started exemestane in late 01/2021. She is tolerating well. She is receiving  financial assistance through [employer]" (see MyChart note from 06/11/21). -most recent mammogram 06/10/21 was benign. -she is clinically doing well, labs reviewed, no concern. Physical exam was unremarkable. There is no clinical concern for recurrence. -she will continue exemestane and surveillance.   2. Bone Health -she reports her last DEXA was in 03/2019, showing osteopenia. This was done at Dr. Sherran Needs office, so I advised her to have repeat done there.   3. Comorbidities: Depression, Fibromyalgia, HLD, HTN, Hypothyroidism, H/o Skin cancer (squamous and basal cell), sleep apnea -She notes having Adrenal fatigue. I discussed based on her labs, this is not consistent with adrenal insufficiency. I recommend she see endocrinologist -She is followed for HTN. She takes her BP at home. The bottom number is always low, and the top number goes to normal when she lays down. -On medications, continue to f/u with her other physicians.    4.  Fatty liver and transaminitis -She has chronic intermittent transaminitis, which she contributes to fatty liver.  She is on low sugar diet and drinks a lot of water -Liver morphology was unremarkable on recent CT scan -She does not drink alcohol, she knows to avoid Tylenol. -Her LFTs fluctuate.     PLAN:  -continue exemestane -labs and f/u with APP in 6 months   No problem-specific Assessment & Plan notes found for this encounter.   SUMMARY OF ONCOLOGIC HISTORY: Oncology History Overview Note  Cancer Staging Ductal carcinoma in situ (DCIS) of left breast Staging form: Breast, AJCC 8th Edition - Clinical stage from 06/21/2020: Stage 0 (cTis (DCIS), cN0, cM0, G2, ER+, PR+, HER2: Not Assessed) - Signed by Truitt Merle, MD on 06/26/2020 Stage prefix: Initial diagnosis  Malignant neoplasm of upper-outer quadrant  of right breast in female, estrogen receptor positive ([place]) Staging form: Breast, AJCC 8th Edition - Clinical stage from 06/21/2020: Stage IA (cT1b,  cN0, cM0, G2, ER+, PR+, HER2-) - Signed by Truitt Merle, MD on 06/26/2020 Stage prefix: Initial diagnosis - Pathologic stage from 07/26/2020: Stage IA (pT1c, pN1a, cM0, G2, ER+, PR+, HER2-) - Signed by Truitt Merle, MD on 08/09/2020 Stage prefix: Initial diagnosis Nuclear grade: G2 Histologic grading system: 3 grade system Residual tumor (R): R0 - None    Malignant neoplasm of upper-outer quadrant of right breast in female, estrogen receptor positive ([place])  06/08/2020 Mammogram   IMPRESSION: 1. 9 x 7 x 6 mm mass in the 12 o'clock position of the right breast, 2cmfn with imaging features highly suspicious for malignancy. 2. 4 mm group of indeterminate calcifications in the 12 o'clock position of the left breast and 4 mm group of indeterminate calcifications in the 1 o'clock position of the left breast. Together, the groups span an area measuring 3.9 cm.   06/21/2020 Cancer Staging   Staging form: Breast, AJCC 8th Edition - Clinical stage from 06/21/2020: Stage IA (cT1b, cN0, cM0, G2, ER+, PR+, HER2-) - Signed by Truitt Merle, MD on 06/26/2020 Stage prefix: Initial diagnosis    06/21/2020 Initial Biopsy   Diagnosis 1. Breast, right, needle core biopsy, 12 oc - INVASIVE MAMMARY CARCINOMA - MAMMARY CARCINOMA IN SITU - SEE COMMENT 2. Breast, left, needle core biopsy, 12 oc - MAMMARY CARCINOMA IN-SITU WITH NECROSIS AND CALCIFICATIONS - SEE COMMENT 3. Breast, left, needle core biopsy, 1 oc - MAMMARY CARCINOMA IN-SITU WITH NECROSIS AND CALCIFICATIONS - SEE COMMENT Microscopic Comment 1. The biopsy material shows an infiltrative proliferation of cells with large vesicular nuclei with inconspicuous nucleoli, arranged linearly and in small clusters. Based on the biopsy, the carcinoma appears Nottingham grade 2 of 3.  Addendum: 1. E-cadherin is POSITIVE supporting a ductal origin. 2. E-cadherin is POSITIVE supporting a ductal origin.  3. E-cadherin is positive supporting a ductal origin. The focus is  less pronounced on the deeper sections and in isolation would likely be considered atypical ductal hyperplasia.   06/21/2020 Receptors her2   1. PROGNOSTIC INDICATORS Results: IMMUNOHISTOCHEMICAL AND MORPHOMETRIC ANALYSIS PERFORMED MANUALLY The tumor cells are EQUIVOCAL for Her2 (2+). HER2 by FISH will be performed and the results reported separately Estrogen Receptor: 95%, POSITIVE, STRONG STAINING INTENSITY Progesterone Receptor: 40%, POSITIVE, MODERATE STAINING INTENSITY Proliferation Marker Ki67: 10%  1. FLUORESCENCE IN-SITU HYBRIDIZATION Results: GROUP 5: HER2 **NEGATIVE** Equivocal form of amplification of the HER2 gene was detected in the IHC 2+ tissue sample received from this individual. HER2 FISH was performed by a technologist and cell imaging and analysis on the [employer].   06/25/2020 Initial Diagnosis   Malignant neoplasm of upper-outer quadrant of right breast in female, estrogen receptor positive ([place])   07/03/2020 Breast MRI   IMPRESSION: 1. Known RIGHT breast cancer, 12 o'clock axis, at anterior depth, measuring 1 cm greatest extent, manifesting as a spiculated enhancing mass on MRI, with associated biopsy clip. Expected post biopsy changes are seen within the adjacent outer RIGHT breast. 2. No evidence of additional multifocal or multicentric disease within the RIGHT breast. 3. Known LEFT breast DCIS within the slightly outer LEFT breast, at anterior depth, corresponding to the biopsy site labeled 1 o'clock axis, with associated enhancement only at the margins of the biopsy cavity measuring up to 5 mm greatest dimension. 4. Known LEFT breast DCIS within the upper central LEFT breast, at middle depth, corresponding to  the biopsy site labeled 12 o'clock axis. Contiguous linear non-mass enhancement extends 2.3 cm superior-medial to the biopsy cavity, most likely post biopsy change but possibly contiguous extent of disease. 5. No evidence of additional multifocal or  multicentric disease within the LEFT breast. 6. No evidence of metastatic lymphadenopathy.     07/26/2020 Cancer Staging   Staging form: Breast, AJCC 8th Edition - Pathologic stage from 07/26/2020: Stage IA (pT1c, pN1a, cM0, G2, ER+, PR+, HER2-) - Signed by Truitt Merle, MD on 08/09/2020 Stage prefix: Initial diagnosis Nuclear grade: G2 Histologic grading system: 3 grade system Residual tumor (R): R0 - None    07/26/2020 Surgery   RIGHT BREAST LUMPECTOMY WITH RADIOACTIVE SEED AND SENTINEL LYMPH NODE BIOPSY by Dr Marlou Starks   07/26/2020 Pathology Results   FINAL MICROSCOPIC DIAGNOSIS:   A. LYMPH NODE, RIGHT AXILLARY #1, SENTINEL, EXCISION:  - Lymph node, negative for carcinoma (0/1)   B. LYMPH NODE, RIGHT AXILLARY, SENTINEL, EXCISION:  - Benign fibroadipose tissue, negative for carcinoma   C. BREAST, RIGHT, LUMPECTOMY:  - Invasive ductal carcinoma, 1.5 cm, grade 2  - Ductal carcinoma in situ, low grade  - Resection margins are negative for carcinoma; closest is the anterior margin of 0.2 cm  - Biopsy site changes  - See oncology table   D. BREAST, LEFT, LUMPECTOMY:  - Benign breast parenchyma with prominent biopsy-related changes  - Negative for residual ductal carcinoma in situ  - See oncology table   E. LYMPH NODE, RIGHT AXILLARY #2, SENTINEL, EXCISION:  - Invasive ductal carcinoma, see comment  COMMENT:  E.  Lymph node tissue is not identified.  Findings likely represent an entirely replaced lymph node with foci of extranodal extension.    07/26/2020 Miscellaneous   Mammaprint High Risk of Luminla Type B 29% risk of recurrence in 10 years if untreated.  Her Mammaprint index is -0.175 She has 94.6% benefit of chemotherapy and hormaonal therapy.      08/20/2020 Imaging   CT C/A/P IMPRESSION: 1. No definitive imaging findings to suggest metastatic disease in the chest, abdomen or pelvis. 2. Postoperative changes of bilateral lumpectomy and right axillary lymph node dissection  with what appears to be a large postoperative seroma in the right axilla, as detailed above. Attention on follow-up studies is recommended to ensure the stability or regression of this collection. 3. Additional incidental findings, as above.   08/28/2020 Imaging   Bone Scan IMPRESSION: No definite scintigraphic evidence of osseous metastases.   08/30/2020 - 11/02/2020 Chemotherapy   Adjuvant Docetaxel and Cytoxan q3weeks for 4 cycles starting 08/30/20. Given skin rash, changes taxol to Abraxane starting with C2 (09/21/20). Completed 11/02/20.    12/03/2020 - 01/15/2021 Radiation Therapy   Bilateral breast radiation and right regional lymph nodes   02/2021 -  Anti-estrogen oral therapy   Adjuvant exemestane   04/11/2021 Survivorship   SCP delivered by Cira Rue, NP   Ductal carcinoma in situ (DCIS) of left breast  06/08/2020 Mammogram   IMPRESSION: 1. 9 x 7 x 6 mm mass in the 12 o'clock position of the right breast, 2cmfn with imaging features highly suspicious for malignancy. 2. 4 mm group of indeterminate calcifications in the 12 o'clock position of the left breast and 4 mm group of indeterminate calcifications in the 1 o'clock position of the left breast. Together, the groups span an area measuring 3.9 cm.   06/21/2020 Cancer Staging   Staging form: Breast, AJCC 8th Edition - Clinical stage from 06/21/2020: Stage 0 (cTis (DCIS),  cN0, cM0, G2, ER+, PR+, HER2: Not Assessed) - Signed by Truitt Merle, MD on 06/26/2020 Stage prefix: Initial diagnosis    06/21/2020 Initial Biopsy   Diagnosis 1. Breast, right, needle core biopsy, 12 oc - INVASIVE MAMMARY CARCINOMA - MAMMARY CARCINOMA IN SITU - SEE COMMENT 2. Breast, left, needle core biopsy, 12 oc - MAMMARY CARCINOMA IN-SITU WITH NECROSIS AND CALCIFICATIONS - SEE COMMENT 3. Breast, left, needle core biopsy, 1 oc - MAMMARY CARCINOMA IN-SITU WITH NECROSIS AND CALCIFICATIONS - SEE COMMENT Microscopic Comment 1. The biopsy material shows  an infiltrative proliferation of cells with large vesicular nuclei with inconspicuous nucleoli, arranged linearly and in small clusters. Based on the biopsy, the carcinoma appears Nottingham grade 2 of 3.  Addendum: 1. E-cadherin is POSITIVE supporting a ductal origin. 2. E-cadherin is POSITIVE supporting a ductal origin.  3. E-cadherin is positive supporting a ductal origin. The focus is less pronounced on the deeper sections and in isolation would likely be considered atypical ductal hyperplasia.   06/21/2020 Receptors her2   2. PROGNOSTIC INDICATORS Results: IMMUNOHISTOCHEMICAL AND MORPHOMETRIC ANALYSIS PERFORMED MANUALLY Estrogen Receptor: 95%, POSITIVE, STRONG STAINING INTENSITY Progesterone Receptor: 30%, POSITIVE, STRONG STAINING INTENSITY   06/25/2020 Initial Diagnosis   Ductal carcinoma in situ (DCIS) of left breast   07/03/2020 Breast MRI   IMPRESSION: 1. Known RIGHT breast cancer, 12 o'clock axis, at anterior depth, measuring 1 cm greatest extent, manifesting as a spiculated enhancing mass on MRI, with associated biopsy clip. Expected post biopsy changes are seen within the adjacent outer RIGHT breast. 2. No evidence of additional multifocal or multicentric disease within the RIGHT breast. 3. Known LEFT breast DCIS within the slightly outer LEFT breast, at anterior depth, corresponding to the biopsy site labeled 1 o'clock axis, with associated enhancement only at the margins of the biopsy cavity measuring up to 5 mm greatest dimension. 4. Known LEFT breast DCIS within the upper central LEFT breast, at middle depth, corresponding to the biopsy site labeled 12 o'clock axis. Contiguous linear non-mass enhancement extends 2.3 cm superior-medial to the biopsy cavity, most likely post biopsy change but possibly contiguous extent of disease. 5. No evidence of additional multifocal or multicentric disease within the LEFT breast. 6. No evidence of metastatic lymphadenopathy.      07/26/2020 Surgery   LEFT BREAST LUMPECTOMY X 2  WITH RADIOACTIVE SEED LOCALIZATION by Dr Marlou Starks   07/26/2020 Pathology Results   FINAL MICROSCOPIC DIAGNOSIS:   A. LYMPH NODE, RIGHT AXILLARY #1, SENTINEL, EXCISION:  - Lymph node, negative for carcinoma (0/1)   B. LYMPH NODE, RIGHT AXILLARY, SENTINEL, EXCISION:  - Benign fibroadipose tissue, negative for carcinoma   C. BREAST, RIGHT, LUMPECTOMY:  - Invasive ductal carcinoma, 1.5 cm, grade 2  - Ductal carcinoma in situ, low grade  - Resection margins are negative for carcinoma; closest is the anterior margin of 0.2 cm  - Biopsy site changes  - See oncology table   D. BREAST, LEFT, LUMPECTOMY:  - Benign breast parenchyma with prominent biopsy-related changes  - Negative for residual ductal carcinoma in situ  - See oncology table   E. LYMPH NODE, RIGHT AXILLARY #2, SENTINEL, EXCISION:  - Invasive ductal carcinoma, see comment  COMMENT:  E.  Lymph node tissue is not identified.  Findings likely represent an entirely replaced lymph node with foci of extranodal extension.    12/03/2020 - 01/15/2021 Radiation Therapy   Bilateral breast radiation and right regional lymph nodes   02/2021 -  Anti-estrogen oral therapy   Adjuvant  exemestane   04/11/2021 Survivorship   SCP delivered by Cira Rue, NP      INTERVAL HISTORY:  [name] is here for a follow up of breast cancer. She was last seen by NP Lacie on 02/28/21 with survivorship in the interim. She presents to the clinic alone. She reports fatigue. She endorses trying to exercise and keep active. She also reports she continues physical therapy for her lymphedema and cording.    All other systems were reviewed with the patient and are negative.  MEDICAL HISTORY:  Past Medical History:  Diagnosis Date   Anal fissure    Atrial septal aneurysm / if pfo  echo 6 13  10/23/2011   Breast cancer ([place])    Cataract    both eyes   Depression    Fatty liver    "pre fatty  liver"   Fibromyalgia    Headache(784.0)    hx of migraines when younger   Heart palpitations    hx with normal holter event monitoring   Hx: UTI (urinary tract infection)    Hyperlipidemia    Hypertension    Hypothyroidism    Obesity    Personal history of chemotherapy    Personal history of radiation therapy    Pneumonia 1972   hx of   Polyp of colon    Serrated adenoma of colon 08/2012   Skin cancer    basal, squamous cell   Sleep apnea    uses cpap    SURGICAL HISTORY: Past Surgical History:  Procedure Laterality Date   BREAST LUMPECTOMY     BREAST LUMPECTOMY WITH RADIOACTIVE SEED AND SENTINEL LYMPH NODE BIOPSY Right 07/26/2020   Procedure: RIGHT BREAST LUMPECTOMY WITH RADIOACTIVE SEED AND SENTINEL LYMPH NODE BIOPSY;  Surgeon: Jovita Kussmaul, MD;  Location: [facility];  Service: General;  Laterality: Right;   BREAST LUMPECTOMY WITH RADIOACTIVE SEED LOCALIZATION Left 07/26/2020   Procedure: LEFT BREAST LUMPECTOMY X 2  WITH RADIOACTIVE SEED LOCALIZATION;  Surgeon: Jovita Kussmaul, MD;  Location: [place];  Service: General;  Laterality: Left;   COLONOSCOPY     DILATION AND CURETTAGE OF [place] ARTHROSCOPY     both in past   POLYPECTOMY     [place] Left 07/07/2013   Procedure: LEFT SHOULDER MINI OPEN SUBACROMIAL DECOMPRESSION ROTATOR CUFF REPAIR AND POSSIBLE PATCH GRAFT ;  Surgeon: Johnn Hai, MD;  Location: [facility];  Service: Orthopedics;  Laterality: Left;  with interscaline block   SKIN CANCER EXCISION Bilateral    arm, legs, and chest   TONSILLECTOMY      I have reviewed the social history and family history with the patient and they are unchanged from previous note.  ALLERGIES:  is allergic to penicillins, taxotere [docetaxel], cefdinir, norvasc [amlodipine besylate], cetirizine, irbesartan, molds & smuts, pegfilgrastim, and tylenol [acetaminophen].  MEDICATIONS:  Current Outpatient Medications   Medication Sig Dispense Refill   Ascorbic Acid (VITAMIN C) 1000 MG tablet Take 1,000 mg by mouth daily.     b complex vitamins capsule Take 1 capsule by mouth daily.     benzonatate (TESSALON PERLES) 100 MG capsule Take 1-2 capsules (100-200 mg total) by mouth 3 (three) times daily as needed for cough (max dose 600 mg per day). 30 capsule 0   Bioflavonoid Products (QUERCETIN COMPLEX IMMUNE PO) Take 1 capsule by mouth daily.     Cholecalciferol (VITAMIN D) 50 MCG (  2000 UT) tablet Take 4,000 Units by mouth daily.     exemestane (AROMASIN) 25 MG tablet Take 1 tablet (25 mg total) by mouth daily after breakfast. 90 tablet 0   hydrochlorothiazide (HYDRODIURIL) 25 MG tablet TAKE 1 TABLET BY MOUTH EVERY DAY 90 tablet 0   hydroxychloroquine (PLAQUENIL) 200 MG tablet Take 200 mg by mouth 2 (two) times a week.     MAGNESIUM CARBONATE PO Take 1 tablet by mouth daily.     melatonin 5 MG TABS Take 5 mg by mouth at bedtime.     Menaquinone-7 (VITAMIN K2) 100 MCG CAPS Take 100 mcg by mouth daily.     Multiple Vitamin (MULTIVITAMIN) tablet Take 1 tablet by mouth daily.     NON FORMULARY Take 48.6 mg by mouth See admin instructions. Thyroid desiccated (procine) sr 2 capsules 5 days a week, 3 capsules 2 days per week (Tues and Fri)     OLIVE LEAF EXTRACT PO Take 1 capsule by mouth daily.     OVER THE COUNTER MEDICATION Take 2 capsules by mouth in the morning and at bedtime. Ifnla Med     OVER THE COUNTER MEDICATION Take 1 capsule by mouth daily. Essential Pro     potassium chloride SA (KLOR-CON M) 20 MEQ tablet TAKE ONE TABLET BY MOUTH ONE TIME DAILY 90 tablet 3   Probiotic Product (PROBIOTIC PO) Take 1 capsule by mouth daily.     UBIQUINOL PO Take by mouth.     VITAMIN E PO Take 23 mg by mouth daily.     ZINC GLUCONATE PO Take 10 mg by mouth daily.     No current facility-administered medications for this visit.    PHYSICAL EXAMINATION: ECOG PERFORMANCE STATUS: 0 - Asymptomatic  Vitals:   07/10/21  1055  BP: (!) 150/76  Pulse: 83  Resp: 18  Temp: 98.7 F (37.1 C)  SpO2: 100%   Wt Readings from Last 3 Encounters:  07/10/21 211 lb 14.4 oz (96.1 kg)  05/28/21 207 lb (93.9 kg)  04/22/21 207 lb 4 oz (94 kg)     GENERAL:alert, no distress and comfortable SKIN: skin color, texture, turgor are normal, no rashes or significant lesions EYES: normal, Conjunctiva are pink and non-injected, sclera clear  NECK: supple, thyroid normal size, non-tender, without nodularity LYMPH:  no palpable lymphadenopathy in the cervical, axillary, (+) cording present to right axilla ABDOMEN:abdomen soft, non-tender and normal bowel sounds Musculoskeletal:no cyanosis of digits and no clubbing  NEURO: alert & oriented x 3 with fluent speech, no focal motor/sensory deficits BREAST: (+) mild lymphedema to right breast. No palpable mass, nodules or adenopathy bilaterally. Breast exam benign.   LABORATORY DATA:  I have reviewed the data as listed CBC Latest Ref Rng & Units 07/10/2021 04/11/2021 01/10/2021  WBC 4.0 - 10.5 K/uL 5.8 7.6 5.3  Hemoglobin 12.0 - 15.0 g/dL 13.1 13.1 12.8  Hematocrit 36.0 - 46.0 % 39.6 40.3 37.1  Platelets 150 - 400 K/uL 194 200 156     CMP Latest Ref Rng & Units 07/10/2021 04/11/2021 01/10/2021  Glucose 70 - 99 mg/dL 89 88 90  BUN 8 - 23 mg/dL $Remove'23 18 12  'LqjDMmm$ Creatinine 0.44 - 1.00 mg/dL 0.78 0.78 0.69  Sodium 135 - 145 mmol/L 141 141 140  Potassium 3.5 - 5.1 mmol/L 3.9 3.4(L) 3.6  Chloride 98 - 111 mmol/L 105 103 105  CO2 22 - 32 mmol/L $RemoveB'30 28 26  'XsAZUjEw$ Calcium 8.9 - 10.3 mg/dL 9.7 9.6 9.5  Total Protein  6.5 - 8.1 g/dL 7.1 7.6 6.8  Total Bilirubin 0.3 - 1.2 mg/dL 0.6 0.5 0.7  Alkaline Phos 38 - 126 U/L 101 117 110  AST 15 - 41 U/L $Remo'26 26 26  'AxaWB$ ALT 0 - 44 U/L 30 33 47(H)      RADIOGRAPHIC STUDIES: I have personally reviewed the radiological images as listed and agreed with the findings in the report. No results found.    No orders of the defined types were placed in this encounter.  All  questions were answered. The patient knows to call the clinic with any problems, questions or concerns. No barriers to learning was detected. The total time spent in the appointment was 30 minutes.     Truitt Merle, MD 07/10/2021   I, Wilburn Mylar, am acting as scribe for Truitt Merle, MD.   I have reviewed the above documentation for accuracy and completeness, and I agree with the above.

## 2021-07-15 DIAGNOSIS — M531 Cervicobrachial syndrome: Secondary | ICD-10-CM | POA: Diagnosis not present

## 2021-07-15 DIAGNOSIS — M9901 Segmental and somatic dysfunction of cervical region: Secondary | ICD-10-CM | POA: Diagnosis not present

## 2021-07-15 DIAGNOSIS — M9902 Segmental and somatic dysfunction of thoracic region: Secondary | ICD-10-CM | POA: Diagnosis not present

## 2021-07-15 DIAGNOSIS — M9903 Segmental and somatic dysfunction of lumbar region: Secondary | ICD-10-CM | POA: Diagnosis not present

## 2021-07-16 ENCOUNTER — Other Ambulatory Visit: Payer: Self-pay

## 2021-07-16 ENCOUNTER — Ambulatory Visit: Payer: Medicare HMO | Attending: Hematology

## 2021-07-16 DIAGNOSIS — D0512 Intraductal carcinoma in situ of left breast: Secondary | ICD-10-CM | POA: Diagnosis not present

## 2021-07-16 DIAGNOSIS — Z483 Aftercare following surgery for neoplasm: Secondary | ICD-10-CM | POA: Insufficient documentation

## 2021-07-16 DIAGNOSIS — Z170 Estrogen receptor positive status [ER+]: Secondary | ICD-10-CM | POA: Insufficient documentation

## 2021-07-16 DIAGNOSIS — R293 Abnormal posture: Secondary | ICD-10-CM | POA: Diagnosis not present

## 2021-07-16 DIAGNOSIS — C50411 Malignant neoplasm of upper-outer quadrant of right female breast: Secondary | ICD-10-CM | POA: Insufficient documentation

## 2021-07-16 DIAGNOSIS — M6281 Muscle weakness (generalized): Secondary | ICD-10-CM | POA: Insufficient documentation

## 2021-07-16 DIAGNOSIS — M25611 Stiffness of right shoulder, not elsewhere classified: Secondary | ICD-10-CM | POA: Diagnosis not present

## 2021-07-16 DIAGNOSIS — R600 Localized edema: Secondary | ICD-10-CM | POA: Diagnosis not present

## 2021-07-16 NOTE — Therapy (Addendum)
[facility] @ [place], [place], [address] Phone: [phone]   Fax:  [phone]  Physical Therapy Treatment  Patient Details  Name: [name] MRN: [id number] Date of Birth: Apr 09, 1956 Referring Provider (PT): Cira Rue   Encounter Date: 07/16/2021   PT End of Session - 07/16/21 1402     Visit Number 14    Number of Visits 18    Date for PT Re-Evaluation 07/18/21    PT Start Time 4599    PT Stop Time 1450    PT Time Calculation (min) 47 min    Activity Tolerance Patient tolerated treatment well    Behavior During Therapy [facility] for tasks assessed/performed             Past Medical History:  Diagnosis Date   Anal fissure    Atrial septal aneurysm / if pfo  echo 6 13  10/23/2011   Breast cancer ([place])    Cataract    both eyes   Depression    Fatty liver    "pre fatty liver"   Fibromyalgia    Headache(784.0)    hx of migraines when younger   Heart palpitations    hx with normal holter event monitoring   Hx: UTI (urinary tract infection)    Hyperlipidemia    Hypertension    Hypothyroidism    Obesity    Personal history of chemotherapy    Personal history of radiation therapy    Pneumonia 1972   hx of   Polyp of colon    Serrated adenoma of colon 08/2012   Skin cancer    basal, squamous cell   Sleep apnea    uses cpap    Past Surgical History:  Procedure Laterality Date   BREAST LUMPECTOMY     BREAST LUMPECTOMY WITH RADIOACTIVE SEED AND SENTINEL LYMPH NODE BIOPSY Right 07/26/2020   Procedure: RIGHT BREAST LUMPECTOMY WITH RADIOACTIVE SEED AND SENTINEL LYMPH NODE BIOPSY;  Surgeon: Jovita Kussmaul, MD;  Location: [facility];  Service: General;  Laterality: Right;   BREAST LUMPECTOMY WITH RADIOACTIVE SEED LOCALIZATION Left 07/26/2020   Procedure: LEFT BREAST LUMPECTOMY X 2  WITH RADIOACTIVE SEED LOCALIZATION;  Surgeon: Jovita Kussmaul, MD;  Location: [facility];  Service: General;  Laterality:  Left;   COLONOSCOPY     DILATION AND CURETTAGE OF [place] ARTHROSCOPY     both in past   POLYPECTOMY     SHOULDER OPEN [place] Left 07/07/2013   Procedure: LEFT SHOULDER MINI OPEN SUBACROMIAL DECOMPRESSION ROTATOR CUFF REPAIR AND POSSIBLE PATCH GRAFT ;  Surgeon: Johnn Hai, MD;  Location[facility];  Service: Orthopedics;  Laterality: Left;  with interscaline block   SKIN CANCER EXCISION Bilateral    arm, legs, and chest   TONSILLECTOMY      There were no vitals filed for this visit.   Subjective Assessment - 07/16/21 1403     Subjective I had my Flexi touch set up this morning with Verdis Frederickson and it went well. the peach foam works but then it slips sometimes. My breasts sweat when they are in my compression bra so I put some cloth in between them. My arm feels good and its not tender anymore.    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 She underwent a left lumpectomy for DCIS and a  right lumpectomy and sentinel node biopsy on 07/26/2020. She had 2 lymph nodes removed with 1 being positive for cancer on the right side. It is ER/PR positive. Left shoulder rotator cuff repair 06/2013. Last radiation on 01/15/21.  Seroma that was drained x 2-3.    Patient Stated Goals Reduce the numbness/pain, be able to sleep on right side,improve shoulderROM, decrease tightness    Currently in Pain? No/denies    Pain Score 0-No pain    Multiple Pain Sites No                   LYMPHEDEMA/ONCOLOGY QUESTIONNAIRE - 07/16/21 0001       Right Upper Extremity Lymphedema   15 cm Proximal to Olecranon Process 35 cm    10 cm Proximal to Olecranon Process 33.1 cm    Olecranon Process 26.9 cm    15 cm Proximal to Ulnar Styloid Process 27.8 cm    10 cm Proximal to Ulnar Styloid Process 25.4 cm    Just Proximal to Ulnar Styloid Process 18 cm    At Base of 2nd Digit 6.3 cm                         [facility] Adult PT Treatment/Exercise - 07/16/21 0001       Manual Therapy   Soft tissue mobilization Supine to right pectorals. lats and  UT    Manual Lymphatic Drainage (MLD) in supine: short neck, 5 diaphragmatic breaths, left axillary lymph nodes. right inguinal nodes, establishment of axillo inguinal anastomosis and inter axillary anastomosis then Right breast  retracing pathways, then right forearm both sides retracing all steps and ending with LN's.                          PT Long Term Goals - 07/16/21 1418       PT LONG TERM GOAL #1   Title Pt will have decreased axillary pain/tightness by atleast 50%    Time 6    Period Weeks    Status Achieved    Target Date 06/20/21      PT LONG TERM GOAL #2   Title Pt will be independent in self MLD of right breast to decrease swelling    Time 6    Period Weeks    Status Achieved    Target Date 06/06/21      PT LONG TERM GOAL #3   Title Pt will be able to stretch right shoulder/axillary region with minimal numbness    Baseline 75% better    Time 6    Period Weeks    Status Achieved    Target Date 06/06/21      PT LONG TERM GOAL #4   Title pt will be able to lie on right side without right arm numbness    Baseline most of the time    Time 6    Period Weeks    Status Achieved    Target Date 06/06/21      PT LONG TERM GOAL #5   Title Pt will have decreased forearm and upper arm tightness/tenderness from cording decreased by atleast 50%    Time 2    Period Weeks    Status Achieved    Target Date 06/20/21      PT LONG TERM GOAL #6   Title Pt will have decreased forearm tenderness from cording by atleast 75%    Time 4  Period Weeks    Status Achieved    Target Date 06/20/21                   Plan - 07/16/21 1422     Clinical Impression Statement Pt has achieved all goals established at evaluation. She is independent with HEP and MLD.  Pain from cording has  decreased by 75% or greater.  Her arm circumference remains stable, and there are no present signs of cording.  She has received her Flexitouch.  Performed soft tissue mobilization and MLD to right breast today. No fibrosis noted at breast today.  Right pecs and UT still tender. Pt is discharged to independent self management. She waas advised to call with questions or concerns    Personal Factors and Comorbidities Age;Time since onset of injury/illness/exacerbation;Comorbidity 3+    Comorbidities Bilateral breast CA with Right SLNB, radiation,chemo, FMS,hypothyroidism, left RTC repair, cervical problems mainly on left    Stability/Clinical Decision Making Stable/Uncomplicated    Rehab Potential Good    PT Frequency 2x / week    PT Duration 4 weeks    PT Treatment/Interventions ADLs/Self Care Home Management;Therapeutic exercise;Patient/family education;Neuromuscular re-education;Manual techniques;Manual lymph drainage;Passive range of motion;Taping;Vasopneumatic Device    PT Next Visit Plan DC to HEP    PT Home Exercise Plan Self MLD daily, UE stretches    Consulted and Agree with Plan of Care Patient             Patient will benefit from skilled therapeutic intervention in order to improve the following deficits and impairments:  Decreased activity tolerance, Decreased range of motion, Pain, Impaired sensation, Increased edema, Postural dysfunction, Decreased strength  Visit Diagnosis: Aftercare following surgery for neoplasm  Localized edema  Abnormal posture  Muscle weakness (generalized)  Stiffness of right shoulder, not elsewhere classified     Problem List Patient Active Problem List   Diagnosis Date Noted   Malignant neoplasm of upper-outer quadrant of right breast in female, estrogen receptor positive ([place]) 06/25/2020   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   Hepatic steatosis 11/27/2014   Hx of adenomatous colonic polyps 11/27/2014   Visit for  preventive health examination 03/28/2014   Hyperlipidemia 03/28/2014   Left rotator cuff tear 07/07/2013   Tear of left rotator cuff 07/07/2013   Rhinitis 06/01/2013   Chest wall deformity 04/12/2012   Atrial septal aneurysm / if pfo  echo 6 13  10/23/2011   OSA (obstructive sleep apnea) 11/27/2010   Dyslipidemia 11/01/2010   Flushing 08/29/2010   Labile hypertension 08/29/2010   MUSCLE CRAMPS, FOOT 06/10/2010   OTHER SLEEP DISTURBANCES 06/10/2010   ANXIETY, SITUATIONAL 05/08/2010   VERTIGO, POSITIONAL 03/15/2010   VITAMIN D DEFICIENCY 02/18/2010   OBESITY 02/18/2010   ALLERGIC RHINITIS 02/18/2010   PLANTAR FASCIITIS 02/18/2010   TWITCHING 06/11/2009   NUMBNESS, HAND 06/11/2009   CERVICAL STRAIN, ACUTE 06/11/2009   OTHER MALAISE AND FATIGUE 07/09/2007   HYPERTENSION 01/28/2007   Hypothyroidism 11/11/2006   COMMON MIGRAINE 11/11/2006   GERD 11/11/2006   FIBROMYALGIA 11/11/2006   PHYSICAL THERAPY DISCHARGE SUMMARY  Visits from Start of Care: 14  Current functional level related to goals / functional outcomes: Achieved all goals   Remaining deficits: Mild breast swelling   Education / Equipment: Flexi touch, compression sleeve, compression bra   Patient agrees to discharge. Patient goals were met. Patient is being discharged due to meeting the stated rehab goals.  Claris Pong, PT 07/16/2021, 2:58 PM  [facility] @ [place], [place], [address] Phone: [phone]   Fax:  [phone]  Name: [name] MRN: [id number] Date of Birth: 1955/12/14

## 2021-07-17 ENCOUNTER — Telehealth: Payer: Self-pay | Admitting: Pharmacist

## 2021-07-17 NOTE — Progress Notes (Signed)
Chronic Care Management Pharmacy Assistant   Name: [name]  MRN: [id number] DOB: 24-Feb-1956  Reason for Encounter: Chart prep for initial encounter with Jeni Salles Clinical Pharmacist on 07/24/21 at 11 am via phone call.   Conditions to be addressed/monitored: HTN, HLD, GERD, Hypothyroidism, and Allergic Rhinitis  Recent office visits:  05/30/21 Panosh, Standley Brooking, MD - Patient presented for Suspected COVID 19 virus and other concerns. Prescribed Benzonatate.  04/08/21 Panosh, Standley Brooking, MD - Patient presented for preoperative health examination and other concerns. Stopped Dexamethasone, Doxycycline, Lidocaine, Lorazepam, Ondansetron, Prochlorperazine and Tramadol  Recent consult visits:  07/16/21 Claris Pong, PT - Patient presented for Aftercare following surgery for neoplasm. No medication changes.  07/10/21 Truitt Merle, MD(Oncology) - Patient presented for Malignant neoplasm of upper outer quadrant of right breast in female and other concerns. No medication changes.  07/04/21 Augoustides, Cherie Dark, MD (Radiology) - Patient presented for Radiology for Ultrasound, [place]  05/01/21 Montel Culver - Patient presented for Segmental and somatic dysfunction of cervical region and other concerns. No other visit details available  04/24/21 Jari Pigg (Dermatology) - Patient presented for Melanocytic nevi of trunk and other concerns. No other visit details available.  04/15/21 [name], Legrand Como - Patient presented for Segmental and somatic dysfunction of cervical region and other concerns. No other visit details available  04/11/21 Alla Feeling, NP (Oncology) - Patient presented for Patient presented for Malignant neoplasm of upper outer quadrant of right breast in female and other concerns. No medication changes.  04/10/21 McComb, John (OBGYN) - Patient presented for Other specified disorders of bone density and structure and other concerns. No other visit details  available.  03/28/21 Jacalyn Lefevre D (Urology) - Patient presented for Cystocele, midline. No other visit details available.  03/27/21 [name], Legrand Como - Patient presented for Segmental and somatic dysfunction of cervical region and other concerns. No other visit details available  03/22/21 Mayl, Corwin Levins, MD (Cardiology) - Patient presented for hypertension and other concerns. Prescribed Norvasc '5mg'$    03/20/21 Landry Corporal, MD (General Surg) - Patient presented for Ductal carcinoma in situ of left breast and other concerns. No medication changes.  03/06/21 Eppie Gibson, MD (Rad Onc) - Patient presented for follow up post radiation treatments. No medication changes.  03/06/21 Beau Fanny, PhD [facility]) - Patient presented for Anxiety disorders and other concerns. No medication changes.  03/05/21 Odette Fraction (Optometry) - Patient presented for Presbyopia and other concerns. No other visit details available.  03/04/21 [name], Legrand Como - Patient presented for Segmental and somatic dysfunction of cervical region and other concerns. No other visit details available  02/28/21 Gaspar Cola, MD (Radiology) - Patient presented for MR Spine Thoracic W/WO Contrast  02/28/21 Basilia Jumbo, PA (Gen Surg) - Patient presented for [place] of bright breast.  02/28/21 Alla Feeling, NP (Oncology) - Patient presented for Patient presented for Patient presented for Malignant neoplasm of upper outer quadrant of right breast in female and other concerns. No medication changes.  02/20/21 Beau Fanny, PhD [facility]) - Patient presented for Anxiety disorders and other concerns. No medication changes.  02/12/21 McComb, John (OBGYN) - Patient presented for Incompetence or weakening of pubocervical tissue. No other visit details available.  02/11/21 Gaspar Cola, MD (Radiology) - Patient presented for MR Spine Thoracic WO Contrast  02/11/21 Montel Culver - Patient presented for Segmental and somatic dysfunction of cervical region and other concerns. No other visit details available  02/06/21 Darylene Price,  Cherylynn Ridges, PhD [facility]) - Patient presented for Anxiety disorders and other concerns. No medication changes.  01/28/21 [name], Legrand Como - Patient presented for Segmental and somatic dysfunction of cervical region and other concerns. No other visit details available  01/23/21 Beau Fanny, PhD [facility]) - Patient presented for Anxiety disorders and other concerns. No medication changes.  Hospital visits:  None in previous 6 months  Medications: Outpatient Encounter Medications as of 07/17/2021  Medication Sig   amLODipine (NORVASC) 5 MG tablet amlodipine 5 mg tablet  TAKE 1 TABLET BY MOUTH ONCE DAILY   Ascorbic Acid (VITAMIN C) 1000 MG tablet Take 1,000 mg by mouth daily.   b complex vitamins capsule Take 1 capsule by mouth daily.   benzonatate (TESSALON PERLES) 100 MG capsule Take 1-2 capsules (100-200 mg total) by mouth 3 (three) times daily as needed for cough (max dose 600 mg per day).   Bioflavonoid Products (QUERCETIN COMPLEX IMMUNE PO) Take 1 capsule by mouth daily.   Cholecalciferol (VITAMIN D) 50 MCG (2000 UT) tablet Take 4,000 Units by mouth daily.   Cholecalciferol 25 MCG (1000 UT) tablet Take by mouth.   exemestane (AROMASIN) 25 MG tablet Take 1 tablet (25 mg total) by mouth daily after breakfast.   hydrochlorothiazide (HYDRODIURIL) 25 MG tablet TAKE 1 TABLET BY MOUTH EVERY DAY   hydroxychloroquine (PLAQUENIL) 200 MG tablet Take 200 mg by mouth 2 (two) times a week.   MAGNESIUM CARBONATE PO Take 1 tablet by mouth daily.   melatonin 5 MG TABS Take 5 mg by mouth at bedtime.   Menaquinone-7 (VITAMIN K2) 100 MCG CAPS Take 100 mcg by mouth daily.   metoprolol succinate (TOPROL-XL) 25 MG 24 hr tablet Take by mouth.   Multiple Vitamin (MULTIVITAMIN) tablet Take 1 tablet by mouth daily.   NON FORMULARY  Take 48.6 mg by mouth See admin instructions. Thyroid desiccated (procine) sr 2 capsules 5 days a week, 3 capsules 2 days per week (Tues and Fri)   OLIVE LEAF EXTRACT PO Take 1 capsule by mouth daily.   OVER THE COUNTER MEDICATION Take 2 capsules by mouth in the morning and at bedtime. Ifnla Med   OVER THE COUNTER MEDICATION Take 1 capsule by mouth daily. Essential Pro   potassium chloride SA (KLOR-CON M) 20 MEQ tablet TAKE ONE TABLET BY MOUTH ONE TIME DAILY   Probiotic Product (PROBIOTIC PO) Take 1 capsule by mouth daily.   Thyroid (NATURE-THROID) 113.75 MG TABS Take by mouth.   UBIQUINOL PO Take by mouth.   VITAMIN E PO Take 23 mg by mouth daily.   ZINC GLUCONATE PO Take 10 mg by mouth daily.   No facility-administered encounter medications on file as of 07/17/2021.  Fill History Gaps : AMLODIPINE '5MG'$  TAB 06/26/2021 90   BENZONATATE '100MG'$    CAP 05/30/2021 5   EXEMESTANE 25 MG TAB! 02/06/2021  30 each   HYDROCHLOROTHIAZIDE 25 MG TAB 12/14/2020 90   POT CL MICRO 20MEQ ER TAB 07/15/2021 90   Have you seen any other providers since your last visit? Patient reports no   Any changes in your medications or health? Patient reports no changes.  Any side effects from any medications? Patient reports with her Exemestane she has increased blood pressure aches is tired and feels down. She reports she was experiencing some dizziness after taking her blood pressure medication but not since starting to take it at night.  Do you have an symptoms or problems not managed by your medications? Patient  reports none  Any concerns about your health right now? Patient reports she does not like the side effects of the Exemestane  or the changes in minerals and potassium because of the HCTZ. She reports she does not care for synthetic medications  and because of so is taking a natural thyroid medication.  Has your provider asked that you check blood pressure, blood sugar, or follow special diet at home?  Patient reports she has an arm cuff and does check her pressure.  Do you get any type of exercise on a regular basis? Patient reports she is doing some walking, yard work and goes to PT currently for lymphedema avoidance and does those stretching exercises at home also.  Do you have any problems getting your medications? Patient reports she is happy with the pharmacies that she is using, she reports other than the thyroid medication all are affordable for her.  Is there anything that you would like to discuss during the appointment? Patient reports none other than the above  Patient confirmed contact number listed on appointment to reach her for call.  Care Gaps: COVID Vaccine - Overdue BP- 150/76 (07/10/21) AWV- 1/23  Star Rating Drugs: none  Ned Clines [place] Clinical Pharmacist Assistant [phone]

## 2021-07-20 ENCOUNTER — Other Ambulatory Visit: Payer: Self-pay | Admitting: *Deleted

## 2021-07-20 MED ORDER — EXEMESTANE 25 MG PO TABS
25.0000 mg | ORAL_TABLET | Freq: Every day | ORAL | 1 refills | Status: DC
Start: 2021-07-20 — End: 2021-10-22

## 2021-07-22 ENCOUNTER — Other Ambulatory Visit: Payer: Self-pay

## 2021-07-22 ENCOUNTER — Ambulatory Visit: Payer: Medicare HMO | Admitting: Rehabilitation

## 2021-07-22 DIAGNOSIS — Z483 Aftercare following surgery for neoplasm: Secondary | ICD-10-CM

## 2021-07-22 DIAGNOSIS — Z170 Estrogen receptor positive status [ER+]: Secondary | ICD-10-CM

## 2021-07-22 DIAGNOSIS — D0512 Intraductal carcinoma in situ of left breast: Secondary | ICD-10-CM

## 2021-07-22 DIAGNOSIS — C50411 Malignant neoplasm of upper-outer quadrant of right female breast: Secondary | ICD-10-CM

## 2021-07-22 NOTE — Therapy (Signed)
?OUTPATIENT PHYSICAL THERAPY [facility] SCREENING NOTE ? ? ?Patient Name: [name] ?MRN: [id number] ?DOB:November 24, 1955, 66 y.o., female ?Today's Date: 07/22/2021 ? ?PCP: Burnis Medin, MD ?REFERRING PROVIDER: Alla Feeling, NP ? ? PT End of Session - 07/22/21 1616   ? ? Visit Number 14   screen only  ? PT Start Time 1612   ? PT Stop Time 7341   ? PT Time Calculation (min) 5 min   ? Activity Tolerance Patient tolerated treatment well   ? Behavior During Therapy [facility] for tasks assessed/performed   ? ?  ?  ? ?  ? ? ?Past Medical History:  ?Diagnosis Date  ? Anal fissure   ? Atrial septal aneurysm / if pfo  echo 6 13  10/23/2011  ? Breast cancer ([place])   ? Cataract   ? both eyes  ? Depression   ? Fatty liver   ? "pre fatty liver"  ? Fibromyalgia   ? Headache(784.0)   ? hx of migraines when younger  ? Heart palpitations   ? hx with normal holter event monitoring  ? Hx: UTI (urinary tract infection)   ? Hyperlipidemia   ? Hypertension   ? Hypothyroidism   ? Obesity   ? Personal history of chemotherapy   ? Personal history of radiation therapy   ? Pneumonia 1972  ? hx of  ? Polyp of colon   ? Serrated adenoma of colon 08/2012  ? Skin cancer   ? basal, squamous cell  ? Sleep apnea   ? uses cpap  ? ?Past Surgical History:  ?Procedure Laterality Date  ? BREAST LUMPECTOMY    ? BREAST LUMPECTOMY WITH RADIOACTIVE SEED AND SENTINEL LYMPH NODE BIOPSY Right 07/26/2020  ? Procedure: RIGHT BREAST LUMPECTOMY WITH RADIOACTIVE SEED AND SENTINEL LYMPH NODE BIOPSY;  Surgeon: Jovita Kussmaul, MD;  Location: [facility];  Service: General;  Laterality: Right;  ? BREAST LUMPECTOMY WITH RADIOACTIVE SEED LOCALIZATION Left 07/26/2020  ? Procedure: LEFT BREAST LUMPECTOMY X 2  WITH RADIOACTIVE SEED LOCALIZATION;  Surgeon: Jovita Kussmaul, MD;  Location: [facility];  Service: General;  Laterality: Left;  ? COLONOSCOPY    ? [place] OF UTERUS  1978  ? [place]  ? [place] ARTHROSCOPY    ? both in past  ? POLYPECTOMY    ?  SHOULDER OPEN ROTATOR CUFF REPAIR Left 07/07/2013  ? Procedure: LEFT SHOULDER MINI OPEN SUBACROMIAL DECOMPRESSION ROTATOR CUFF REPAIR AND POSSIBLE PATCH GRAFT ;  Surgeon: Johnn Hai, MD;  Location[facility];  Service: Orthopedics;  Laterality: Left;  with interscaline block  ? SKIN CANCER EXCISION Bilateral   ? arm, legs, and chest  ? TONSILLECTOMY    ? ?Patient Active Problem List  ? Diagnosis Date Noted  ? Malignant neoplasm of upper-outer quadrant of right breast in female, estrogen receptor positive ([place]) 06/25/2020  ? 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  ? Hepatic steatosis 11/27/2014  ? Hx of adenomatous colonic polyps 11/27/2014  ? Visit for preventive health examination 03/28/2014  ? Hyperlipidemia 03/28/2014  ? Left rotator cuff tear 07/07/2013  ? Tear of left rotator cuff 07/07/2013  ? Rhinitis 06/01/2013  ? Chest wall deformity 04/12/2012  ? Atrial septal aneurysm / if pfo  echo 6 13  10/23/2011  ? OSA (obstructive sleep apnea) 11/27/2010  ? Dyslipidemia 11/01/2010  ? Flushing 08/29/2010  ? Labile hypertension 08/29/2010  ? MUSCLE CRAMPS, FOOT 06/10/2010  ? OTHER SLEEP  DISTURBANCES 06/10/2010  ? ANXIETY, SITUATIONAL 05/08/2010  ? VERTIGO, POSITIONAL 03/15/2010  ? VITAMIN D DEFICIENCY 02/18/2010  ? OBESITY 02/18/2010  ? ALLERGIC RHINITIS 02/18/2010  ? PLANTAR FASCIITIS 02/18/2010  ? TWITCHING 06/11/2009  ? NUMBNESS, HAND 06/11/2009  ? CERVICAL STRAIN, ACUTE 06/11/2009  ? OTHER MALAISE AND FATIGUE 07/09/2007  ? HYPERTENSION 01/28/2007  ? Hypothyroidism 11/11/2006  ? COMMON MIGRAINE 11/11/2006  ? GERD 11/11/2006  ? FIBROMYALGIA 11/11/2006  ? ? ?REFERRING DIAG: Rt breast cancer ? ?THERAPY DIAG:  ?Aftercare following surgery for neoplasm ? ?Ductal carcinoma in situ (DCIS) of left breast ? ?Malignant neoplasm of upper-outer quadrant of right breast in female, estrogen receptor positive ([place]) ? ?PERTINENT HISTORY:  ? Patient was diagnosed on 05/22/2020 with left DCIS and right invasive  ductal carcinoma breast cancer. She underwent a left lumpectomy for DCIS and a right lumpectomy and sentinel node biopsy on 07/26/2020. She had 2 lymph nodes removed with 1 being positive for cancer on the right side. It is ER/PR positive. Left shoulder rotator cuff repair 06/2013. Last radiation on 01/15/21.  Seroma that was drained x 2-3.   ? ? ?PRECAUTIONS: Rt UE lymphedema risk ? ?SUBJECTIVE: Doing well  ? ?PAIN:  ?Are you having pain? No ? ?SOZO SCREENING: ?Patient was assessed today using the SOZO machine to determine the lymphedema index score. This was compared to her baseline score. It was determined that she is within the recommended range when compared to her baseline and no further action is needed at this time. She will return in 3 months for her next SOZO screen. ? ?Plan: Will continue 3 month SOZO screens until at least 07/27/22 ? ?Shan Levans, PT ? ?07/22/2021, 4:17 PM ? ?  ? ?

## 2021-07-24 ENCOUNTER — Ambulatory Visit (INDEPENDENT_AMBULATORY_CARE_PROVIDER_SITE_OTHER): Payer: Medicare HMO | Admitting: Pharmacist

## 2021-07-24 DIAGNOSIS — E785 Hyperlipidemia, unspecified: Secondary | ICD-10-CM

## 2021-07-24 DIAGNOSIS — R0989 Other specified symptoms and signs involving the circulatory and respiratory systems: Secondary | ICD-10-CM

## 2021-07-24 NOTE — Progress Notes (Signed)
? ?Chronic Care Management ?Pharmacy Note ? ?08/02/2021 ?Name:  [name] MRN:  [id number] DOB:  05/18/1955 ? ?Summary: ?LDL not at goal < 100 ?Pt is only taking 1/2 tablet of HCTZ ? ?Recommendations/Changes made from today's visit: ?-Recommended repeat lipid panel and consider cholesterol lowering therapy ?-Recommended supplementing with calcium citrate up to recommended 1200 mg per Manning depending on current intake ?-Recommended routine BP monitoring at home ? ?Plan: ?BP assessment in 2 months ?Follow up in 4-5 months ? ? ?Subjective: ?[name] is an 66 y.o. year old female who is Manning primary patient of Panosh, Standley Brooking, Manning.  The CCM team was consulted for assistance with disease management and care coordination needs.   ? ?Engaged with patient by telephone for initial visit in response to provider referral for pharmacy case management and/or care coordination services.  ? ?Consent to Services:  ?The patient was given the following information about Chronic Care Management services today, agreed to services, and gave verbal consent: 1. CCM service includes personalized support from designated clinical staff supervised by the primary care provider, including individualized plan of care and coordination with other care providers 2. 24/7 contact phone numbers for assistance for urgent and routine care needs. 3. Service will only be billed when office clinical staff spend 20 minutes or more in Manning month to coordinate care. 4. Only one practitioner may furnish and bill the service in Manning calendar month. 5.The patient may stop CCM services at any time (effective at the end of the month) by phone call to the office staff. 6. The patient will be responsible for cost sharing (co-pay) of up to 20% of the service fee (after annual deductible is met). Patient agreed to services and consent obtained. ? ?Patient Care Team: ?Panosh, Standley Brooking, Manning as PCP - General ?Frances Nigh, Manning as Consulting Physician (Obstetrics and  Gynecology) ?[name]  ?Frances Day, Manning as Consulting Physician (Orthopedic Surgery) ?Frances Artist, Manning as Consulting Physician (Gastroenterology) ?Frances Schools, Manning as Consulting Physician (Orthopedic Surgery) ?Frances Germany, RN as Oncology Nurse Navigator ?Frances Kaufmann, RN as Oncology Nurse Navigator ?Frances Kussmaul, Manning as Consulting Physician (General Surgery) ?Frances Merle, Manning as Consulting Physician (Hematology) ?Frances Gibson, Manning as Attending Physician (Radiation Oncology) ?Frances Pigg, Manning as Consulting Physician (Dermatology) ?Frances Kyle, Manning as Referring Physician (Cardiology) ?Frances Feeling, Manning as Nurse Practitioner (Nurse Practitioner) ?[name], [name] as Pharmacist (Pharmacist) ? ?Recent office visits: ?05/30/21 Panosh, Standley Brooking, Manning - Patient presented for Suspected COVID 19 virus and other concerns. Prescribed Benzonatate. ?  ?04/08/21 Panosh, Standley Brooking, Manning - Patient presented for preoperative health examination and other concerns. Stopped Dexamethasone, Doxycycline, Lidocaine, Lorazepam, Ondansetron, Prochlorperazine and Tramadol ? ?Recent consult visits: ?07/16/21 [name], PT - Patient presented for Aftercare following surgery for neoplasm. No medication changes. ?  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 No medication changes. ?  ?07/04/21 Augoustides, Frances Dark, Manning (Radiology) - Patient presented for Radiology for Ultrasound, [place] ?  ?05/01/21 [name] - Patient presented for Segmental and somatic dysfunction of cervical region and other concerns. No other visit details available ?  ?04/24/21 [name] (Dermatology) - Patient presented for Melanocytic nevi of trunk and other concerns. No other visit details available. ?  ?04/15/21 [name] - Patient presented for Segmental and somatic dysfunction of cervical region and other concerns. No other visit  details available ?  ?04/11/21 Frances Feeling, Manning (Oncology) - Patient presented for Patient presented for Malignant neoplasm of upper outer quadrant of right breast in female and other concerns. No medication changes. ?  ?04/10/21 Manning, Frances (OBGYN) - Patient presented for Other specified disorders of bone density and structure and other concerns. No other visit details available. ?  ?03/28/21 [name] (Urology) - Patient presented for Cystocele, midline. No other visit details available. ?  ?03/27/21 [name] - Patient presented for Segmental and somatic dysfunction of cervical region and other concerns. No other visit details available ?  ?03/22/21 Manning, Frances Levins, Manning (Cardiology) - Patient presented for hypertension and other concerns. Prescribed Norvasc 18m  ?  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 No medication changes. ?  ?03/06/21 Frances Gibson Manning (Rad Onc) - Patient presented for follow up post radiation treatments. No medication changes. ?  ?03/06/21 [name] ([facility] - Patient presented for Anxiety disorders and other concerns. No medication changes. ?  ?03/05/21 [name]([facility] - Patient presented for Presbyopia and other concerns. No other visit details available. ?  ?03/04/21 [name]- Patient presented for Segmental and somatic dysfunction of cervical region and other concerns. No other visit details available ?  ?02/28/21 [name] (Radiology) - Patient presented for MR Spine Thoracic W/WO Contrast ?  ?02/28/21 [name] (Gen Surg) - Patient presented for [place]of bright breast. ?  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 No medication changes. ?   ?02/20/21 [name] ([facility] - Patient presented for Anxiety disorders and other concerns. No medication changes. ?  ?02/12/21 Manning, Frances ([facility] - Patient presented for Incompetence or weakening of pubocervical tissue. No other visit details available. ?  ?02/11/21 [name] (Radiology) - Patient presented for MR Spine Thoracic WO Contrast ?  ?02/11/21 [name]- Patient presented for Segmental and somatic dysfunction of cervical region and other concerns. No other visit details available ?  ?02/06/21 [name] ([facility] - Patient presented for Anxiety disorders and other concerns. No medication changes. ?  ?01/28/21 [name]- Patient presented for Segmental and somatic dysfunction of cervical region and other concerns. No other visit details available ?  ?01/23/21 [name] ([facility] - Patient presented for Anxiety disorders and other concerns. No medication changes. ?  ? ?Hospital visits: ?None in previous 6 months ? ? ?Objective: ? ?Lab Results  ?Component Value Date  ? CREATININE 0.78 07/10/2021  ? BUN 23 07/10/2021  ? GFR 86.85 12/30/2018  ? GFRNONAA >60 07/10/2021  ? GFRAA 95 01/09/2020  ? NA 141 07/10/2021  ? K 3.9 07/10/2021  ? CALCIUM 9.7 07/10/2021  ? CO2 30 07/10/2021  ? GLUCOSE 89 07/10/2021  ? ? ?Lab Results  ?Component Value Date/Time  ? HGBA1C 5.2 06/01/2013 04:45 PM  ? HGBA1C 5.4 12/27/2012 01:55 PM  ? GFR 86.85 12/30/2018 03:50 PM  ? GFR 76.89 07/02/2017 03:46 PM  ?  ?Last diabetic Eye exam: No results found for: HMDIABEYEEXA  ?Last diabetic Foot exam: No results found for: HMDIABFOOTEX  ? ?Lab Results  ?Component Value Date  ? CHOL 226 (Manning) 06/05/2020  ? HDL 57 06/05/2020  ?  [place] 156 06/05/2020  ? LDLDIRECT 165.2 12/27/2012  ? TRIG 56 08/05/2019  ? CHOLHDL 2.9 04/12/2019  ? ? ? ?  Latest Ref Rng & Units 07/10/2021  ? 10:28 AM 04/11/2021  ?  1:40 PM 01/10/2021  ? 10:39 AM  ?Hepatic Function  ?Total  Protein 6.5 - 8.1 g/dL 7.1   7.6   6.8    ?Albumin 3.5 - 5.0 g/dL 4.3   4.2   3.9    ?AST 15 - 41 U/L _0 ?ALT 0 - 44 U/L 30   33   47    ?Alk Phosphatase 38 - 126 U/L 101   117   110    ?Total Bilirubin

## 2021-08-02 NOTE — Patient Instructions (Addendum)
Hi [name], ? ?It was great to get to meet you over the telephone! Below is a summary of some of the topics we discussed.  ? ?Don't forget to start taking calcium citrate like we discussed to make sure to get the recommended 1200 mg per day. Use the calcium calculator app we talked about to see how much you are getting from vegetables each day and then add on to that if you need additional supplementation. ? ?Also, don't forget to start checking your blood pressure regularly at home like we discussed. That way we can make sure your medications are working properly and figure out the best regimen for you to be on. ? ?Please reach out to me if you have any questions or need anything before our follow up! ? ?Best, ?Maddie ? ?Jeni Salles, PharmD, BCACP ?Clinical Pharmacist ?Therapist, music at [place] ?[phone] ? ?Visit Information ? ? Goals Addressed   ?None ?  ? ?Patient Care Plan: [name]  ?  ? ?Problem Identified: Problem: Hypertension, Hyperlipidemia, and Hypothyroidism   ?  ? ?Long-Range Goal: Patient-Specific Goal   ?Start Date: 07/24/2021  ?Expected End Date: 07/25/2022  ?This Visit's Progress: On track  ?Priority: High  ?Note:   ?Current Barriers:  ?Unable to independently monitor therapeutic efficacy ?Unable to achieve control of cholesterol  ?Unable to maintain control of blood pressure ? ?Pharmacist Clinical Goal(s):  ?Patient will achieve adherence to monitoring guidelines and medication adherence to achieve therapeutic efficacy ?achieve control of cholesterol as evidenced by next lipid panel ?maintain control of blood pressure as evidenced by home and office blood pressure readings  through collaboration with PharmD and provider.  ? ?Interventions: ?1:1 collaboration with Panosh, Standley Brooking, MD regarding development and update of comprehensive plan of care as evidenced by provider attestation and co-signature ?Inter-disciplinary care team collaboration (see longitudinal plan of  care) ?Comprehensive medication review performed; medication list updated in electronic medical record ? ?Hypertension (BP goal <130/80) ?-Uncontrolled ?-Current treatment: ?Amlodipine 5 mg 1 tablet daily - Appropriate, Query effective, Safe, Accessible ?Hydrochlorothiazide 25 mg 1 tablet daily - taking 1/2 tablet daily  - Appropriate, Query effective, Safe, Accessible ?-Medications previously tried: valsartan (recall), irbesartan (side effects)   ?-Current home readings: 125/76 (arm cuff - has brought it into the office) ?-Current dietary habits: limiting salt intake - cut out processed meats and eating organic, vegetables and fruit and meat; no flour and no sugar and most grains; allergic to dairy; no table salt ([place] salt) but is careful with that and eating everything from scratch ?-Current exercise habits: trying to exercise regularly ?-Denies hypotensive/hypertensive symptoms ?-Educated on BP goals and benefits of medications for prevention of heart attack, stroke and kidney damage; ?Importance of home blood pressure monitoring; ?Proper BP monitoring technique; ?Symptoms of hypotension and importance of maintaining adequate hydration; ?-Counseled to monitor BP at home weekly, document, and provide log at future appointments ?-Counseled on diet and exercise extensively ?Recommended to continue current medication ? ?Hyperlipidemia: (LDL goal < 100) ?-Uncontrolled ?-Current treatment: ?No medications ?-Medications previously tried: none  ?-Current dietary patterns: eating a lot of vegetables ?-Current exercise habits: trying to be active ?-Educated on Cholesterol goals;  ?Benefits of statin for ASCVD risk reduction; ?Importance of limiting foods high in cholesterol; ?-Counseled on diet and exercise extensively ?Recommended repeat lipid panel and consider cholesterol lowering therapy. ? ?Osteopenia (Goal prevent fractures) ?-Not ideally controlled ?-Last DEXA Scan: 2020 (unable to access records)   ? T-Score  femoral neck: n/a ? T-Score  total hip: n/a ? T-Score lumbar spine: n/a ? T-Score forearm radius: n/a ? 10-year probability of major osteoporotic fracture: n/a ? 10-year probability of hip fracture: n/a ?-Patient is not a candidate for pharmacologic treatment ?-Current treatment  ?Vitamin D 2000 units 2 tablets daily - Appropriate, Effective, Safe, Accessible ?-Medications previously tried: none  ?-Recommend [phone] units of vitamin D daily. Recommend 1200 mg of calcium daily from dietary and supplemental sources. Recommend weight-bearing and muscle strengthening exercises for building and maintaining bone density. ?-Recommended supplementing with calcium citrate in addition to dietary sources. ? ?Hypothyroidism (Goal: 2.5-4.5) ?-Not ideally controlled ?-Current treatment  ?Nature-throid 48.6 mg 2 capsules 2 days a week (Sat and Sun), 3 capsules (Mon through Friday) - Appropriate, Effective, Safe, Accessible ?-Medications previously tried: armor thyroid  ?-Recommended to continue current medication ? ?Insomnia (Goal: improve quality and quantity of sleep) ?-Controlled ?-Current treatment  ?Melatonin 5 mg 1 tablet at bedtime - Appropriate, Effective, Safe, Accessible ?-Medications previously tried: none  ?-Counseled on practicing good sleep hygiene by setting a sleep schedule and maintaining it, avoid excessive napping, following a nightly routine, avoiding screen time for 30-60 minutes before going to bed, and making the bedroom a cool, quiet and dark space. ? ?Breast cancer (Goal: remission) ?-Controlled ?-Current treatment  ?Exemestane 25 mg 1 tablet daily after breakfast - Appropriate, Effective, Safe, Accessible ?-Medications previously tried: none  ?-Recommended to continue current medication ?Assessed patient finances. Patient is getting it for an affordable price with [facility]. ? ?Health Maintenance ?-Vaccine gaps: COVID vaccine, Prevnar 20, shingrix ?-Current therapy:  ?Vitamin B complex daily ?Vitamin C  1000 mg 1 tablet daily ?Quercetin complex daily ?Magnesium carbonate daily ?Vitamin K2 100 mcg capsule daily ?Multivitamin 1 tablet daily ?Olive leaf extract daily ?Probiotic daily ?Ubiquinol (CoQ10) daily ?Zinc gluconate 10 mg daily ?Potassium chloride 20 mEq 1 tablet daily ?-Educated on Cost vs benefit of each product must be carefully weighed by individual consumer ?-Patient is satisfied with current therapy and denies issues ?-Recommended to continue current medication ? ?Patient Goals/Self-Care Activities ?Patient will:  ?- take medications as prescribed as evidenced by patient report and record review ?check blood pressure twice weekly, document, and provide at future appointments ?target a minimum of 150 minutes of moderate intensity exercise weekly ? ?Follow Up Plan: Telephone follow up appointment with care management team member scheduled for: 4-5 months ? ?  ? ? ?[name] was given information about Chronic Care Management services today including:  ?CCM service includes personalized support from designated clinical staff supervised by her physician, including individualized plan of care and coordination with other care providers ?24/7 contact phone numbers for assistance for urgent and routine care needs. ?Standard insurance, coinsurance, copays and deductibles apply for chronic care management only during months in which we provide at least 20 minutes of these services. Most insurances cover these services at 100%, however patients may be responsible for any copay, coinsurance and/or deductible if applicable. This service may help you avoid the need for more expensive face-to-face services. ?Only one practitioner may furnish and bill the service in a calendar month. ?The patient may stop CCM services at any time (effective at the end of the month) by phone call to the office staff. ? ?Patient agreed to services and verbal consent obtained.  ? ?Patient verbalizes understanding of instructions and  care plan provided today and agrees to view in [place]. Active MyChart status confirmed with patient.   ?Telephone follow up appointment with pharmacy team member scheduled for: August  2023 ? ?Viona Gilmore,

## 2021-08-05 ENCOUNTER — Other Ambulatory Visit: Payer: Self-pay | Admitting: Hematology

## 2021-08-05 DIAGNOSIS — M9902 Segmental and somatic dysfunction of thoracic region: Secondary | ICD-10-CM | POA: Diagnosis not present

## 2021-08-05 DIAGNOSIS — M9901 Segmental and somatic dysfunction of cervical region: Secondary | ICD-10-CM | POA: Diagnosis not present

## 2021-08-05 DIAGNOSIS — Z170 Estrogen receptor positive status [ER+]: Secondary | ICD-10-CM

## 2021-08-05 DIAGNOSIS — M531 Cervicobrachial syndrome: Secondary | ICD-10-CM | POA: Diagnosis not present

## 2021-08-05 DIAGNOSIS — M9903 Segmental and somatic dysfunction of lumbar region: Secondary | ICD-10-CM | POA: Diagnosis not present

## 2021-08-08 ENCOUNTER — Encounter: Payer: Self-pay | Admitting: Internal Medicine

## 2021-08-09 DIAGNOSIS — E039 Hypothyroidism, unspecified: Secondary | ICD-10-CM

## 2021-08-09 DIAGNOSIS — M8580 Other specified disorders of bone density and structure, unspecified site: Secondary | ICD-10-CM

## 2021-08-09 DIAGNOSIS — E785 Hyperlipidemia, unspecified: Secondary | ICD-10-CM | POA: Diagnosis not present

## 2021-08-09 DIAGNOSIS — Z853 Personal history of malignant neoplasm of breast: Secondary | ICD-10-CM | POA: Diagnosis not present

## 2021-08-09 DIAGNOSIS — I10 Essential (primary) hypertension: Secondary | ICD-10-CM | POA: Diagnosis not present

## 2021-08-13 DIAGNOSIS — G4733 Obstructive sleep apnea (adult) (pediatric): Secondary | ICD-10-CM | POA: Diagnosis not present

## 2021-08-15 ENCOUNTER — Other Ambulatory Visit: Payer: Self-pay

## 2021-08-16 DIAGNOSIS — K760 Fatty (change of) liver, not elsewhere classified: Secondary | ICD-10-CM | POA: Diagnosis not present

## 2021-08-16 DIAGNOSIS — E039 Hypothyroidism, unspecified: Secondary | ICD-10-CM | POA: Diagnosis not present

## 2021-08-16 DIAGNOSIS — I10 Essential (primary) hypertension: Secondary | ICD-10-CM | POA: Diagnosis not present

## 2021-08-16 DIAGNOSIS — R5381 Other malaise: Secondary | ICD-10-CM | POA: Diagnosis not present

## 2021-08-16 DIAGNOSIS — E782 Mixed hyperlipidemia: Secondary | ICD-10-CM | POA: Diagnosis not present

## 2021-08-16 DIAGNOSIS — E049 Nontoxic goiter, unspecified: Secondary | ICD-10-CM | POA: Diagnosis not present

## 2021-08-19 ENCOUNTER — Encounter: Payer: Self-pay | Admitting: Nurse Practitioner

## 2021-08-20 ENCOUNTER — Telehealth: Payer: Self-pay | Admitting: Hematology

## 2021-08-20 DIAGNOSIS — M545 Low back pain, unspecified: Secondary | ICD-10-CM | POA: Diagnosis not present

## 2021-08-20 NOTE — Telephone Encounter (Signed)
.  Called patient to schedule appointment per 4/11 pt req, patient is aware of date and time.   ?

## 2021-08-26 DIAGNOSIS — M9901 Segmental and somatic dysfunction of cervical region: Secondary | ICD-10-CM | POA: Diagnosis not present

## 2021-08-26 DIAGNOSIS — M9903 Segmental and somatic dysfunction of lumbar region: Secondary | ICD-10-CM | POA: Diagnosis not present

## 2021-08-26 DIAGNOSIS — M531 Cervicobrachial syndrome: Secondary | ICD-10-CM | POA: Diagnosis not present

## 2021-08-26 DIAGNOSIS — M9902 Segmental and somatic dysfunction of thoracic region: Secondary | ICD-10-CM | POA: Diagnosis not present

## 2021-08-29 ENCOUNTER — Other Ambulatory Visit: Payer: Self-pay

## 2021-08-29 ENCOUNTER — Inpatient Hospital Stay: Payer: Medicare HMO | Attending: Hematology | Admitting: Nurse Practitioner

## 2021-08-29 VITALS — BP 148/73 | HR 73 | Temp 98.7°F | Resp 18 | Wt 209.2 lb

## 2021-08-29 DIAGNOSIS — R7401 Elevation of levels of liver transaminase levels: Secondary | ICD-10-CM | POA: Diagnosis not present

## 2021-08-29 DIAGNOSIS — C50411 Malignant neoplasm of upper-outer quadrant of right female breast: Secondary | ICD-10-CM | POA: Insufficient documentation

## 2021-08-29 DIAGNOSIS — E785 Hyperlipidemia, unspecified: Secondary | ICD-10-CM | POA: Diagnosis not present

## 2021-08-29 DIAGNOSIS — D0512 Intraductal carcinoma in situ of left breast: Secondary | ICD-10-CM | POA: Insufficient documentation

## 2021-08-29 DIAGNOSIS — Z170 Estrogen receptor positive status [ER+]: Secondary | ICD-10-CM | POA: Diagnosis not present

## 2021-08-29 DIAGNOSIS — G4730 Sleep apnea, unspecified: Secondary | ICD-10-CM | POA: Insufficient documentation

## 2021-08-29 DIAGNOSIS — Z79899 Other long term (current) drug therapy: Secondary | ICD-10-CM | POA: Insufficient documentation

## 2021-08-29 DIAGNOSIS — Z85828 Personal history of other malignant neoplasm of skin: Secondary | ICD-10-CM | POA: Insufficient documentation

## 2021-08-29 DIAGNOSIS — E039 Hypothyroidism, unspecified: Secondary | ICD-10-CM | POA: Insufficient documentation

## 2021-08-29 DIAGNOSIS — K760 Fatty (change of) liver, not elsewhere classified: Secondary | ICD-10-CM | POA: Insufficient documentation

## 2021-08-29 DIAGNOSIS — Z8719 Personal history of other diseases of the digestive system: Secondary | ICD-10-CM | POA: Diagnosis not present

## 2021-08-29 DIAGNOSIS — I890 Lymphedema, not elsewhere classified: Secondary | ICD-10-CM | POA: Diagnosis not present

## 2021-08-29 DIAGNOSIS — R5383 Other fatigue: Secondary | ICD-10-CM | POA: Diagnosis not present

## 2021-08-29 DIAGNOSIS — M797 Fibromyalgia: Secondary | ICD-10-CM | POA: Diagnosis not present

## 2021-08-29 DIAGNOSIS — I10 Essential (primary) hypertension: Secondary | ICD-10-CM | POA: Insufficient documentation

## 2021-08-29 NOTE — Progress Notes (Signed)
?[place]   ?[phone] [phone]   ?Clinic Follow up Note  ? ?Patient Care Team: ?Panosh, Standley Brooking, MD as PCP - General ?Arvella Nigh, MD as Consulting Physician (Obstetrics and Gynecology) ?Dr A  ?Susa Day, MD as Consulting Physician (Orthopedic Surgery) ?Ladene Artist, MD as Consulting Physician (Gastroenterology) ?Melina Schools, MD as Consulting Physician (Orthopedic Surgery) ?Rockwell Germany, RN as Oncology Nurse Navigator ?Mauro Kaufmann, RN as Oncology Nurse Navigator ?Jovita Kussmaul, MD as Consulting Physician (General Surgery) ?Truitt Merle, MD as Consulting Physician (Hematology) ?Eppie Gibson, MD as Attending Physician (Radiation Oncology) ?Jari Pigg, MD as Consulting Physician (Dermatology) ?Serita Kyle, MD as Referring Physician (Cardiology) ?Alla Feeling, NP as Nurse Practitioner (Nurse Practitioner) ?Viona Gilmore, [facility] as Pharmacist (Pharmacist) ?Date of Service: 08/29/2021 ? ?CHIEF COMPLAINT: 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 ? ?SUMMARY OF ONCOLOGIC HISTORY: ?Oncology History Overview Note  ?Cancer Staging ?Ductal carcinoma in situ (DCIS) of left breast ?Staging form: Breast, AJCC 8th Edition ?- Clinical stage from 06/21/2020: Stage 0 (cTis (DCIS), cN0, cM0, G2, ER+, PR+, HER2: Not Assessed) - Signed by Truitt Merle, MD on 06/26/2020 ?Stage prefix: Initial diagnosis ? ?Malignant neoplasm of upper-outer quadrant of right breast in female, estrogen receptor positive ([place]) ?Staging form: Breast, AJCC 8th Edition ?- Clinical stage from 06/21/2020: Stage IA (cT1b, cN0, cM0, G2, ER+, PR+, HER2-) - Signed by Truitt Merle, MD on 06/26/2020 ?Stage prefix: Initial diagnosis ?- Pathologic stage from 07/26/2020: Stage IA (pT1c, pN1a, cM0, G2, ER+, PR+, HER2-) - Signed by Truitt Merle, MD on 08/09/2020 ?Stage prefix: Initial diagnosis ?Nuclear grade: G2 ?Histologic grading system: 3 grade system ?Residual tumor (R): R0 - None ? ?  ?Malignant neoplasm  of upper-outer quadrant of right breast in female, estrogen receptor positive ([place])  ?06/08/2020 Mammogram  ? IMPRESSION: ?1. 9 x 7 x 6 mm mass in the 12 o'clock position of the right breast, 2cmfn with ?imaging features highly suspicious for malignancy. ?2. 4 mm group of indeterminate calcifications in the 12 o'clock ?position of the left breast and 4 mm group of indeterminate ?calcifications in the 1 o'clock position of the left breast. Together, the groups span an area measuring 3.9 cm. ?  ?06/21/2020 Cancer Staging  ? Staging form: Breast, AJCC 8th Edition ?- Clinical stage from 06/21/2020: Stage IA (cT1b, cN0, cM0, G2, ER+, PR+, HER2-) - Signed by Truitt Merle, MD on 06/26/2020 ?Stage prefix: Initial diagnosis ? ?  ?06/21/2020 Initial Biopsy  ? Diagnosis ?1. Breast, right, needle core biopsy, 12 oc ?- INVASIVE MAMMARY CARCINOMA ?- MAMMARY CARCINOMA IN SITU ?- SEE COMMENT ?2. Breast, left, needle core biopsy, 12 oc ?- MAMMARY CARCINOMA IN-SITU WITH NECROSIS AND CALCIFICATIONS ?- SEE COMMENT ?3. Breast, left, needle core biopsy, 1 oc ?- MAMMARY CARCINOMA IN-SITU WITH NECROSIS AND CALCIFICATIONS ?- SEE COMMENT ?Microscopic Comment ?1. The biopsy material shows an infiltrative proliferation of cells with large vesicular nuclei with inconspicuous nucleoli, ?arranged linearly and in small clusters. Based on the biopsy, the carcinoma appears Nottingham grade 2 of 3. ? ?Addendum: ?1. E-cadherin is POSITIVE supporting a ductal origin. ?2. E-cadherin is POSITIVE supporting a ductal origin.  ?3. E-cadherin is positive supporting a ductal origin. The focus is less pronounced on the deeper sections and in isolation would likely be considered atypical ductal hyperplasia. ?  ?06/21/2020 Receptors her2  ? 1. PROGNOSTIC INDICATORS ?Results: ?IMMUNOHISTOCHEMICAL AND MORPHOMETRIC ANALYSIS PERFORMED MANUALLY ?The tumor cells are EQUIVOCAL for Her2 (2+). ?HER2 by  FISH will be performed and the results reported separately ?Estrogen Receptor:  95%, POSITIVE, STRONG STAINING INTENSITY ?Progesterone Receptor: 40%, POSITIVE, MODERATE STAINING INTENSITY ?Proliferation Marker Ki67: 10% ? ?1. FLUORESCENCE IN-SITU HYBRIDIZATION ?Results: ?GROUP 5: HER2 **NEGATIVE** ?Equivocal form of amplification of the HER2 gene was detected in the IHC 2+ tissue sample received from this ?individual. HER2 FISH was performed by a technologist and cell imaging and analysis on the [employer]. ?  ?06/25/2020 Initial Diagnosis  ? Malignant neoplasm of upper-outer quadrant of right breast in female, estrogen receptor positive ([place]) ? ?  ?07/03/2020 Breast MRI  ? IMPRESSION: ?1. Known RIGHT breast cancer, 12 o'clock axis, at anterior depth, ?measuring 1 cm greatest extent, manifesting as a spiculated ?enhancing mass on MRI, with associated biopsy clip. Expected post biopsy changes are seen within the adjacent outer RIGHT breast. ?2. No evidence of additional multifocal or multicentric disease ?within the RIGHT breast. ?3. Known LEFT breast DCIS within the slightly outer LEFT breast, at anterior depth, corresponding to the biopsy site labeled 1 o'clock ?axis, with associated enhancement only at the margins of the biopsy cavity measuring up to 5 mm greatest dimension. ?4. Known LEFT breast DCIS within the upper central LEFT breast, at middle depth, corresponding to the biopsy site labeled 12 o'clock ?axis. Contiguous linear non-mass enhancement extends 2.3 cm ?superior-medial to the biopsy cavity, most likely post biopsy change but possibly contiguous extent of disease. ?5. No evidence of additional multifocal or multicentric disease ?within the LEFT breast. ?6. No evidence of metastatic lymphadenopathy. ?  ?  ?07/26/2020 Cancer Staging  ? Staging form: Breast, AJCC 8th Edition ?- Pathologic stage from 07/26/2020: Stage IA (pT1c, pN1a, cM0, G2, ER+, PR+, HER2-) - Signed by Truitt Merle, MD on 08/09/2020 ?Stage prefix: Initial diagnosis ?Nuclear grade: G2 ?Histologic grading system: 3 grade  system ?Residual tumor (R): R0 - None ? ?  ?07/26/2020 Surgery  ? RIGHT BREAST LUMPECTOMY WITH RADIOACTIVE SEED AND SENTINEL LYMPH NODE BIOPSY by Dr Marlou Starks ?  ?07/26/2020 Pathology Results  ? FINAL MICROSCOPIC DIAGNOSIS:  ? ?A. LYMPH NODE, RIGHT AXILLARY #1, SENTINEL, EXCISION:  ?- Lymph node, negative for carcinoma (0/1)  ? ?B. LYMPH NODE, RIGHT AXILLARY, SENTINEL, EXCISION:  ?- Benign fibroadipose tissue, negative for carcinoma  ? ?C. BREAST, RIGHT, LUMPECTOMY:  ?- Invasive ductal carcinoma, 1.5 cm, grade 2  ?- Ductal carcinoma in situ, low grade  ?- Resection margins are negative for carcinoma; closest is the anterior margin of 0.2 cm  ?- Biopsy site changes  ?- See oncology table  ? ?D. BREAST, LEFT, LUMPECTOMY:  ?- Benign breast parenchyma with prominent biopsy-related changes  ?- Negative for residual ductal carcinoma in situ  ?- See oncology table  ? ?E. LYMPH NODE, RIGHT AXILLARY #2, SENTINEL, EXCISION:  ?- Invasive ductal carcinoma, see comment  ?COMMENT:  ?E.  Lymph node tissue is not identified.  Findings likely represent an entirely replaced lymph node with foci of extranodal extension.  ?  ?07/26/2020 Miscellaneous  ? Mammaprint ?High Risk of Luminla Type B ?29% risk of recurrence in 10 years if untreated.  ?Her Mammaprint index is -0.175 ?She has 94.6% benefit of chemotherapy and hormaonal therapy.  ? ? ?  ?08/20/2020 Imaging  ? CT C/A/P ?IMPRESSION: ?1. No definitive imaging findings to suggest metastatic disease in ?the chest, abdomen or pelvis. ?2. Postoperative changes of bilateral lumpectomy and right axillary ?lymph node dissection with what appears to be a large postoperative ?seroma in the right axilla, as detailed above. Attention on ?  follow-up studies is recommended to ensure the stability or ?regression of this collection. ?3. Additional incidental findings, as above. ?  ?08/28/2020 Imaging  ? Bone Scan ?IMPRESSION: ?No definite scintigraphic evidence of osseous metastases. ?  ?08/30/2020 -  11/02/2020 Chemotherapy  ? Adjuvant Docetaxel and Cytoxan q3weeks for 4 cycles starting 08/30/20. Given skin rash, changes taxol to Abraxane starting with C2 (09/21/20). Completed 11/02/20.  ?  ?12/03/2020 - 01/15/2021

## 2021-08-30 ENCOUNTER — Telehealth: Payer: Self-pay | Admitting: Hematology

## 2021-08-30 NOTE — Telephone Encounter (Signed)
Scheduled follow-up appointment per 4/20 los. Patient is aware. 

## 2021-09-01 ENCOUNTER — Encounter: Payer: Self-pay | Admitting: Nurse Practitioner

## 2021-09-01 ENCOUNTER — Encounter: Payer: Self-pay | Admitting: Hematology

## 2021-09-11 ENCOUNTER — Other Ambulatory Visit: Payer: Self-pay | Admitting: Nurse Practitioner

## 2021-09-11 ENCOUNTER — Encounter: Payer: Self-pay | Admitting: Nurse Practitioner

## 2021-09-11 DIAGNOSIS — M9901 Segmental and somatic dysfunction of cervical region: Secondary | ICD-10-CM | POA: Diagnosis not present

## 2021-09-11 DIAGNOSIS — M9902 Segmental and somatic dysfunction of thoracic region: Secondary | ICD-10-CM | POA: Diagnosis not present

## 2021-09-11 DIAGNOSIS — M531 Cervicobrachial syndrome: Secondary | ICD-10-CM | POA: Diagnosis not present

## 2021-09-11 DIAGNOSIS — M9903 Segmental and somatic dysfunction of lumbar region: Secondary | ICD-10-CM | POA: Diagnosis not present

## 2021-09-11 DIAGNOSIS — M25511 Pain in right shoulder: Secondary | ICD-10-CM

## 2021-09-12 ENCOUNTER — Ambulatory Visit (HOSPITAL_COMMUNITY)
Admission: RE | Admit: 2021-09-12 | Discharge: 2021-09-12 | Disposition: A | Discharge status: Home / Self Care | Payer: Medicare HMO | Source: Ambulatory Visit | Attending: Nurse Practitioner | Admitting: Nurse Practitioner

## 2021-09-12 DIAGNOSIS — M25511 Pain in right shoulder: Secondary | ICD-10-CM | POA: Insufficient documentation

## 2021-09-13 ENCOUNTER — Telehealth: Payer: Self-pay | Admitting: Pharmacist

## 2021-09-13 NOTE — Chronic Care Management (AMB) (Signed)
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 No medication changes.  Hospital visits:  None in previous 6 months  Medications: Outpatient Encounter Medications as of 09/13/2021  Medication Sig   amLODipine (NORVASC) 5 MG tablet amlodipine 5 mg tablet  TAKE 1 TABLET BY MOUTH ONCE DAILY   Ascorbic Acid (VITAMIN C) 1000 MG tablet Take 1,000 mg by mouth daily.   b complex vitamins capsule Take 1 capsule by mouth daily.   Bioflavonoid Products (QUERCETIN COMPLEX IMMUNE PO) Take 1 capsule by mouth as needed.   Cholecalciferol (VITAMIN D) 50 MCG (2000 UT) tablet Take 4,000 Units by mouth daily.   exemestane (AROMASIN) 25 MG tablet Take 1 tablet (25 mg total) by mouth daily after breakfast.   hydrochlorothiazide (HYDRODIURIL) 25 MG tablet TAKE 1 TABLET BY MOUTH EVERY DAY (Patient taking differently: Take 12.5 mg by mouth daily.)   MAGNESIUM CARBONATE PO Take 1 tablet by mouth daily.   melatonin 5 MG TABS Take 5 mg by mouth at bedtime.   Menaquinone-7 (VITAMIN K2) 100 MCG CAPS Take 100 mcg by mouth daily.   Multiple Vitamin (MULTIVITAMIN) tablet Take 1 tablet by mouth daily.   NON FORMULARY Take 48.6 mg by mouth See admin instructions. Thyroid desiccated (procine) sr 2 capsules 2 days a week (Sat and Sun), 3 capsules (Mon through Friday)   OLIVE LEAF EXTRACT PO Take 1 capsule by mouth daily.   OVER THE COUNTER MEDICATION Take 2 capsules by mouth in the morning and at bedtime. Infla Med   OVER THE COUNTER MEDICATION Take 1 capsule by mouth daily. Essential Pro   potassium chloride SA (KLOR-CON M) 20 MEQ tablet TAKE ONE  TABLET BY MOUTH ONE TIME DAILY   Probiotic Product (PROBIOTIC PO) Take 1 capsule by mouth daily.   UBIQUINOL PO Take by mouth.   ZINC GLUCONATE PO Take 10 mg by mouth daily.   No facility-administered encounter medications on file as of 09/13/2021.  Reviewed chart prior to disease state call. Spoke with patient regarding BP  Recent Office Vitals: BP Readings from Last 3 Encounters:  08/29/21 (!) 148/73  07/10/21 (!) 150/76  05/30/21 116/72   Pulse Readings from Last 3 Encounters:  08/29/21 73  07/10/21 83  05/30/21 82    Wt Readings from Last 3 Encounters:  08/29/21 209 lb 4 oz (94.9 kg)  07/10/21 211 lb 14.4 oz (96.1 kg)  05/28/21 207 lb (93.9 kg)     Kidney Function Lab Results  Component Value Date/Time   CREATININE 0.78 07/10/2021 10:28 AM   CREATININE 0.78 04/11/2021 01:40 PM   CREATININE 0.86 02/06/2011 09:37 AM   GFR 86.85 12/30/2018 03:50 PM   GFRNONAA >60 07/10/2021 10:28 AM   GFRNONAA >60 02/06/2011 09:37 AM   GFRAA 95 01/09/2020 12:00 AM   GFRAA >60 02/06/2011 09:37 AM       Latest Ref Rng & Units 07/10/2021   10:28 AM 04/11/2021    1:40 PM 01/10/2021   10:39 AM  BMP  Glucose 70 - 99 mg/dL 89   88   90    BUN 8 - 23 mg/dL 23   18  12    Creatinine 0.44 - 1.00 mg/dL 0.78   0.78   0.69    Sodium 135 - 145 mmol/L 141   141   140    Potassium 3.5 - 5.1 mmol/L 3.9   3.4   3.6    Chloride 98 - 111 mmol/L 105   103   105    CO2 22 - 32 mmol/L '30   28   26    '$ Calcium 8.9 - 10.3 mg/dL 9.7   9.6   9.5      Current antihypertensive regimen:  Amlodipine 5 mg ( 1 tab daily) HCTZ 25 mg (half tab daily) How often are you checking your Blood Pressure? 3-5x per week Current home BP readings: 137/70 148/70 160/70 What recent interventions/DTPs have been made by any provider to improve Blood Pressure control since last CPP Visit: Patient reports she had been taking the Exemestane and having side effects that included her blood pressure being high she has since stopped the  medication and been off of it for about 3 weeks, she reports other side effects have gone but her pressures still get up to 140/70 she is to follow up with Oncology on tomorrow. Any recent hospitalizations or ED visits since last visit with CPP? No Notes: Patient reports she has been dealing with researching some other medications per Oncology and has a lot to think about and process, but has otherwise been doing well. She reports she does not have any questions or concerns at this time.  Adherence Review: Is the patient currently on ACE/ARB medication? No Does the patient have >5 day gap between last estimated fill dates? No   Care Gaps: COVID Booster - Overdue Zoster Vaccine - Overdue PNA Vaccine - Overdue CCM - 8/23 AWV- 1/23 BP- 148/73 ( 08/29/21)  Star Rating Drugs: None      Ned Clines [place] Clinical Pharmacist Assistant [phone]

## 2021-09-18 DIAGNOSIS — C50912 Malignant neoplasm of unspecified site of left female breast: Secondary | ICD-10-CM | POA: Diagnosis not present

## 2021-09-18 DIAGNOSIS — C50911 Malignant neoplasm of unspecified site of right female breast: Secondary | ICD-10-CM | POA: Diagnosis not present

## 2021-09-19 DIAGNOSIS — Z833 Family history of diabetes mellitus: Secondary | ICD-10-CM | POA: Diagnosis not present

## 2021-09-19 DIAGNOSIS — Z79899 Other long term (current) drug therapy: Secondary | ICD-10-CM | POA: Diagnosis not present

## 2021-09-19 DIAGNOSIS — Z880 Allergy status to penicillin: Secondary | ICD-10-CM | POA: Diagnosis not present

## 2021-09-19 DIAGNOSIS — Z8262 Family history of osteoporosis: Secondary | ICD-10-CM | POA: Diagnosis not present

## 2021-09-19 DIAGNOSIS — I10 Essential (primary) hypertension: Secondary | ICD-10-CM | POA: Diagnosis not present

## 2021-09-19 DIAGNOSIS — Z823 Family history of stroke: Secondary | ICD-10-CM | POA: Diagnosis not present

## 2021-09-19 DIAGNOSIS — Z8249 Family history of ischemic heart disease and other diseases of the circulatory system: Secondary | ICD-10-CM | POA: Diagnosis not present

## 2021-09-19 DIAGNOSIS — C50919 Malignant neoplasm of unspecified site of unspecified female breast: Secondary | ICD-10-CM | POA: Diagnosis not present

## 2021-09-19 DIAGNOSIS — Z881 Allergy status to other antibiotic agents status: Secondary | ICD-10-CM | POA: Diagnosis not present

## 2021-09-19 DIAGNOSIS — E782 Mixed hyperlipidemia: Secondary | ICD-10-CM | POA: Diagnosis not present

## 2021-09-19 DIAGNOSIS — R69 Illness, unspecified: Secondary | ICD-10-CM | POA: Diagnosis not present

## 2021-09-24 DIAGNOSIS — Z170 Estrogen receptor positive status [ER+]: Secondary | ICD-10-CM | POA: Diagnosis not present

## 2021-09-24 DIAGNOSIS — C50411 Malignant neoplasm of upper-outer quadrant of right female breast: Secondary | ICD-10-CM | POA: Diagnosis not present

## 2021-09-25 ENCOUNTER — Encounter (HOSPITAL_COMMUNITY): Payer: Self-pay

## 2021-09-26 ENCOUNTER — Telehealth: Payer: Self-pay | Admitting: Nurse Practitioner

## 2021-09-26 ENCOUNTER — Inpatient Hospital Stay: Payer: Medicare HMO | Admitting: Nurse Practitioner

## 2021-09-26 NOTE — Telephone Encounter (Signed)
Left message with follow-up appointment per 5/18 secure chat.

## 2021-09-30 DIAGNOSIS — M9902 Segmental and somatic dysfunction of thoracic region: Secondary | ICD-10-CM | POA: Diagnosis not present

## 2021-09-30 DIAGNOSIS — M531 Cervicobrachial syndrome: Secondary | ICD-10-CM | POA: Diagnosis not present

## 2021-09-30 DIAGNOSIS — M9901 Segmental and somatic dysfunction of cervical region: Secondary | ICD-10-CM | POA: Diagnosis not present

## 2021-09-30 DIAGNOSIS — M9903 Segmental and somatic dysfunction of lumbar region: Secondary | ICD-10-CM | POA: Diagnosis not present

## 2021-10-11 DIAGNOSIS — D492 Neoplasm of unspecified behavior of bone, soft tissue, and skin: Secondary | ICD-10-CM | POA: Diagnosis not present

## 2021-10-15 DIAGNOSIS — M4712 Other spondylosis with myelopathy, cervical region: Secondary | ICD-10-CM | POA: Diagnosis not present

## 2021-10-17 DIAGNOSIS — M9901 Segmental and somatic dysfunction of cervical region: Secondary | ICD-10-CM | POA: Diagnosis not present

## 2021-10-17 DIAGNOSIS — M9903 Segmental and somatic dysfunction of lumbar region: Secondary | ICD-10-CM | POA: Diagnosis not present

## 2021-10-17 DIAGNOSIS — M9902 Segmental and somatic dysfunction of thoracic region: Secondary | ICD-10-CM | POA: Diagnosis not present

## 2021-10-17 DIAGNOSIS — M531 Cervicobrachial syndrome: Secondary | ICD-10-CM | POA: Diagnosis not present

## 2021-10-21 ENCOUNTER — Other Ambulatory Visit: Payer: Self-pay

## 2021-10-21 DIAGNOSIS — C50411 Malignant neoplasm of upper-outer quadrant of right female breast: Secondary | ICD-10-CM

## 2021-10-21 DIAGNOSIS — M4722 Other spondylosis with radiculopathy, cervical region: Secondary | ICD-10-CM | POA: Diagnosis not present

## 2021-10-21 DIAGNOSIS — D0512 Intraductal carcinoma in situ of left breast: Secondary | ICD-10-CM

## 2021-10-21 NOTE — Progress Notes (Signed)
[place]   [phone] [phone]   Clinic Follow up Note   Patient Care Team: Panosh, Standley Brooking, MD as PCP - General Arvella Nigh, MD as Consulting Physician (Obstetrics and Gynecology) Dr Peggye Form, MD as Consulting Physician (Orthopedic Surgery) Ladene Artist, MD as Consulting Physician (Gastroenterology) Melina Schools, MD as Consulting Physician (Orthopedic Surgery) Rockwell Germany, RN as Oncology Nurse Navigator Mauro Kaufmann, RN as Oncology Nurse Navigator Jovita Kussmaul, MD as Consulting Physician (General Surgery) Truitt Merle, MD as Consulting Physician (Hematology) Eppie Gibson, MD as Attending Physician (Radiation Oncology) Jari Pigg, MD as Consulting Physician (Dermatology) Serita Kyle, MD as Referring Physician (Cardiology) Alla Feeling, NP as Nurse Practitioner (Nurse Practitioner) Viona Gilmore, [facility] as Pharmacist (Pharmacist) 10/22/2021  CHIEF COMPLAINT: Follow-up antiestrogen therapy discussion  SUMMARY OF ONCOLOGIC HISTORY: Oncology History Overview Note  Cancer Staging Ductal carcinoma in situ (DCIS) of left breast Staging form: Breast, AJCC 8th Edition - Clinical stage from 06/21/2020: Stage 0 (cTis (DCIS), cN0, cM0, G2, ER+, PR+, HER2: Not Assessed) - Signed by Truitt Merle, MD on 06/26/2020 Stage prefix: Initial diagnosis  Malignant neoplasm of upper-outer quadrant of right breast in female, estrogen receptor positive ([place]) Staging form: Breast, AJCC 8th Edition - Clinical stage from 06/21/2020: Stage IA (cT1b, cN0, cM0, G2, ER+, PR+, HER2-) - Signed by Truitt Merle, MD on 06/26/2020 Stage prefix: Initial diagnosis - Pathologic stage from 07/26/2020: Stage IA (pT1c, pN1a, cM0, G2, ER+, PR+, HER2-) - Signed by Truitt Merle, MD on 08/09/2020 Stage prefix: Initial diagnosis Nuclear grade: G2 Histologic grading system: 3 grade system Residual tumor (R): R0 - None    Malignant neoplasm of upper-outer quadrant  of right breast in female, estrogen receptor positive ([place])  06/08/2020 Mammogram   IMPRESSION: 1. 9 x 7 x 6 mm mass in the 12 o'clock position of the right breast, 2cmfn with imaging features highly suspicious for malignancy. 2. 4 mm group of indeterminate calcifications in the 12 o'clock position of the left breast and 4 mm group of indeterminate calcifications in the 1 o'clock position of the left breast. Together, the groups span an area measuring 3.9 cm.   06/21/2020 Cancer Staging   Staging form: Breast, AJCC 8th Edition - Clinical stage from 06/21/2020: Stage IA (cT1b, cN0, cM0, G2, ER+, PR+, HER2-) - Signed by Truitt Merle, MD on 06/26/2020 Stage prefix: Initial diagnosis   06/21/2020 Initial Biopsy   Diagnosis 1. Breast, right, needle core biopsy, 12 oc - INVASIVE MAMMARY CARCINOMA - MAMMARY CARCINOMA IN SITU - SEE COMMENT 2. Breast, left, needle core biopsy, 12 oc - MAMMARY CARCINOMA IN-SITU WITH NECROSIS AND CALCIFICATIONS - SEE COMMENT 3. Breast, left, needle core biopsy, 1 oc - MAMMARY CARCINOMA IN-SITU WITH NECROSIS AND CALCIFICATIONS - SEE COMMENT Microscopic Comment 1. The biopsy material shows an infiltrative proliferation of cells with large vesicular nuclei with inconspicuous nucleoli, arranged linearly and in small clusters. Based on the biopsy, the carcinoma appears Nottingham grade 2 of 3.  Addendum: 1. E-cadherin is POSITIVE supporting a ductal origin. 2. E-cadherin is POSITIVE supporting a ductal origin.  3. E-cadherin is positive supporting a ductal origin. The focus is less pronounced on the deeper sections and in isolation would likely be considered atypical ductal hyperplasia.   06/21/2020 Receptors her2   1. PROGNOSTIC INDICATORS Results: IMMUNOHISTOCHEMICAL AND MORPHOMETRIC ANALYSIS PERFORMED MANUALLY The tumor cells are EQUIVOCAL for Her2 (2+). HER2 by FISH will be performed and the results reported  separately Estrogen Receptor: 95%, POSITIVE, STRONG  STAINING INTENSITY Progesterone Receptor: 40%, POSITIVE, MODERATE STAINING INTENSITY Proliferation Marker Ki67: 10%  1. FLUORESCENCE IN-SITU HYBRIDIZATION Results: GROUP 5: HER2 **NEGATIVE** Equivocal form of amplification of the HER2 gene was detected in the IHC 2+ tissue sample received from this individual. HER2 FISH was performed by a technologist and cell imaging and analysis on the [employer].   06/25/2020 Initial Diagnosis   Malignant neoplasm of upper-outer quadrant of right breast in female, estrogen receptor positive ([place])   07/03/2020 Breast MRI   IMPRESSION: 1. Known RIGHT breast cancer, 12 o'clock axis, at anterior depth, measuring 1 cm greatest extent, manifesting as a spiculated enhancing mass on MRI, with associated biopsy clip. Expected post biopsy changes are seen within the adjacent outer RIGHT breast. 2. No evidence of additional multifocal or multicentric disease within the RIGHT breast. 3. Known LEFT breast DCIS within the slightly outer LEFT breast, at anterior depth, corresponding to the biopsy site labeled 1 o'clock axis, with associated enhancement only at the margins of the biopsy cavity measuring up to 5 mm greatest dimension. 4. Known LEFT breast DCIS within the upper central LEFT breast, at middle depth, corresponding to the biopsy site labeled 12 o'clock axis. Contiguous linear non-mass enhancement extends 2.3 cm superior-medial to the biopsy cavity, most likely post biopsy change but possibly contiguous extent of disease. 5. No evidence of additional multifocal or multicentric disease within the LEFT breast. 6. No evidence of metastatic lymphadenopathy.     07/26/2020 Cancer Staging   Staging form: Breast, AJCC 8th Edition - Pathologic stage from 07/26/2020: Stage IA (pT1c, pN1a, cM0, G2, ER+, PR+, HER2-) - Signed by Truitt Merle, MD on 08/09/2020 Stage prefix: Initial diagnosis Nuclear grade: G2 Histologic grading system: 3 grade system Residual tumor (R): R0  - None   07/26/2020 Surgery   RIGHT BREAST LUMPECTOMY WITH RADIOACTIVE SEED AND SENTINEL LYMPH NODE BIOPSY by Dr Marlou Starks   07/26/2020 Pathology Results   FINAL MICROSCOPIC DIAGNOSIS:   A. LYMPH NODE, RIGHT AXILLARY #1, SENTINEL, EXCISION:  - Lymph node, negative for carcinoma (0/1)   B. LYMPH NODE, RIGHT AXILLARY, SENTINEL, EXCISION:  - Benign fibroadipose tissue, negative for carcinoma   C. BREAST, RIGHT, LUMPECTOMY:  - Invasive ductal carcinoma, 1.5 cm, grade 2  - Ductal carcinoma in situ, low grade  - Resection margins are negative for carcinoma; closest is the anterior margin of 0.2 cm  - Biopsy site changes  - See oncology table   D. BREAST, LEFT, LUMPECTOMY:  - Benign breast parenchyma with prominent biopsy-related changes  - Negative for residual ductal carcinoma in situ  - See oncology table   E. LYMPH NODE, RIGHT AXILLARY #2, SENTINEL, EXCISION:  - Invasive ductal carcinoma, see comment  COMMENT:  E.  Lymph node tissue is not identified.  Findings likely represent an entirely replaced lymph node with foci of extranodal extension.    07/26/2020 Miscellaneous   Mammaprint High Risk of Luminla Type B 29% risk of recurrence in 10 years if untreated.  Her Mammaprint index is -0.175 She has 94.6% benefit of chemotherapy and hormaonal therapy.      08/20/2020 Imaging   CT C/A/P IMPRESSION: 1. No definitive imaging findings to suggest metastatic disease in the chest, abdomen or pelvis. 2. Postoperative changes of bilateral lumpectomy and right axillary lymph node dissection with what appears to be a large postoperative seroma in the right axilla, as detailed above. Attention on follow-up studies is recommended to ensure the stability or regression  of this collection. 3. Additional incidental findings, as above.   08/28/2020 Imaging   Bone Scan IMPRESSION: No definite scintigraphic evidence of osseous metastases.   08/30/2020 - 11/02/2020 Chemotherapy   Adjuvant  Docetaxel and Cytoxan q3weeks for 4 cycles starting 08/30/20. Given skin rash, changes taxol to Abraxane starting with C2 (09/21/20). Completed 11/02/20.    12/03/2020 - 01/15/2021 Radiation Therapy   Bilateral breast radiation and right regional lymph nodes   02/2021 -  Anti-estrogen oral therapy   Adjuvant exemestane   04/11/2021 Survivorship   SCP delivered by Cira Rue, NP   Ductal carcinoma in situ (DCIS) of left breast  06/08/2020 Mammogram   IMPRESSION: 1. 9 x 7 x 6 mm mass in the 12 o'clock position of the right breast, 2cmfn with imaging features highly suspicious for malignancy. 2. 4 mm group of indeterminate calcifications in the 12 o'clock position of the left breast and 4 mm group of indeterminate calcifications in the 1 o'clock position of the left breast. Together, the groups span an area measuring 3.9 cm.   06/21/2020 Cancer Staging   Staging form: Breast, AJCC 8th Edition - Clinical stage from 06/21/2020: Stage 0 (cTis (DCIS), cN0, cM0, G2, ER+, PR+, HER2: Not Assessed) - Signed by Truitt Merle, MD on 06/26/2020 Stage prefix: Initial diagnosis   06/21/2020 Initial Biopsy   Diagnosis 1. Breast, right, needle core biopsy, 12 oc - INVASIVE MAMMARY CARCINOMA - MAMMARY CARCINOMA IN SITU - SEE COMMENT 2. Breast, left, needle core biopsy, 12 oc - MAMMARY CARCINOMA IN-SITU WITH NECROSIS AND CALCIFICATIONS - SEE COMMENT 3. Breast, left, needle core biopsy, 1 oc - MAMMARY CARCINOMA IN-SITU WITH NECROSIS AND CALCIFICATIONS - SEE COMMENT Microscopic Comment 1. The biopsy material shows an infiltrative proliferation of cells with large vesicular nuclei with inconspicuous nucleoli, arranged linearly and in small clusters. Based on the biopsy, the carcinoma appears Nottingham grade 2 of 3.  Addendum: 1. E-cadherin is POSITIVE supporting a ductal origin. 2. E-cadherin is POSITIVE supporting a ductal origin.  3. E-cadherin is positive supporting a ductal origin. The focus is less  pronounced on the deeper sections and in isolation would likely be considered atypical ductal hyperplasia.   06/21/2020 Receptors her2   2. PROGNOSTIC INDICATORS Results: IMMUNOHISTOCHEMICAL AND MORPHOMETRIC ANALYSIS PERFORMED MANUALLY Estrogen Receptor: 95%, POSITIVE, STRONG STAINING INTENSITY Progesterone Receptor: 30%, POSITIVE, STRONG STAINING INTENSITY   06/25/2020 Initial Diagnosis   Ductal carcinoma in situ (DCIS) of left breast   07/03/2020 Breast MRI   IMPRESSION: 1. Known RIGHT breast cancer, 12 o'clock axis, at anterior depth, measuring 1 cm greatest extent, manifesting as a spiculated enhancing mass on MRI, with associated biopsy clip. Expected post biopsy changes are seen within the adjacent outer RIGHT breast. 2. No evidence of additional multifocal or multicentric disease within the RIGHT breast. 3. Known LEFT breast DCIS within the slightly outer LEFT breast, at anterior depth, corresponding to the biopsy site labeled 1 o'clock axis, with associated enhancement only at the margins of the biopsy cavity measuring up to 5 mm greatest dimension. 4. Known LEFT breast DCIS within the upper central LEFT breast, at middle depth, corresponding to the biopsy site labeled 12 o'clock axis. Contiguous linear non-mass enhancement extends 2.3 cm superior-medial to the biopsy cavity, most likely post biopsy change but possibly contiguous extent of disease. 5. No evidence of additional multifocal or multicentric disease within the LEFT breast. 6. No evidence of metastatic lymphadenopathy.     07/26/2020 Surgery   LEFT BREAST LUMPECTOMY X 2  WITH RADIOACTIVE SEED LOCALIZATION by Dr Marlou Starks   07/26/2020 Pathology Results   FINAL MICROSCOPIC DIAGNOSIS:   A. LYMPH NODE, RIGHT AXILLARY #1, SENTINEL, EXCISION:  - Lymph node, negative for carcinoma (0/1)   B. LYMPH NODE, RIGHT AXILLARY, SENTINEL, EXCISION:  - Benign fibroadipose tissue, negative for carcinoma   C. BREAST, RIGHT, LUMPECTOMY:   - Invasive ductal carcinoma, 1.5 cm, grade 2  - Ductal carcinoma in situ, low grade  - Resection margins are negative for carcinoma; closest is the anterior margin of 0.2 cm  - Biopsy site changes  - See oncology table   D. BREAST, LEFT, LUMPECTOMY:  - Benign breast parenchyma with prominent biopsy-related changes  - Negative for residual ductal carcinoma in situ  - See oncology table   E. LYMPH NODE, RIGHT AXILLARY #2, SENTINEL, EXCISION:  - Invasive ductal carcinoma, see comment  COMMENT:  E.  Lymph node tissue is not identified.  Findings likely represent an entirely replaced lymph node with foci of extranodal extension.    12/03/2020 - 01/15/2021 Radiation Therapy   Bilateral breast radiation and right regional lymph nodes   02/2021 -  Anti-estrogen oral therapy   Adjuvant exemestane   04/11/2021 Survivorship   SCP delivered by Cira Rue, NP     CURRENT THERAPY: Adjuvant exemestane 02/28/2021 - 08/29/2021, further antiestrogen therapy is pending  INTERVAL HISTORY: [name] returns for follow-up as scheduled, last seen by me 08/29/2021, exemestane has been on hold.  She feels much better overall with more energy.  Pain is mostly gone, she is seeing Ortho for lumbar and cervical back pain and paresthesia in her fingers which appears to be positional.  She also has mild neuropathy in the pads of 3 toes.  AI was causing increased blood pressure and her provider added a calcium channel blocker.  She continues to have right breast pain, stable since surgery, without new lump/mass, nipple discharge or inversion, or skin change.  She uses lymphedema machine.  She is focusing on self-care/love and overall wellness.  She is interested in the effects of red Clover, licorice, ashwagandha, and black cohosh on estrogen level.  She would like her hormone levels checked.  She has done extensive research on antiestrogen options and has prayed about it, she is comfortable trying letrozole.  All  other systems were reviewed with the patient and are negative.  MEDICAL HISTORY:  Past Medical History:  Diagnosis Date   Anal fissure    Atrial septal aneurysm / if pfo  echo 6 13  10/23/2011   Breast cancer ([place])    Cataract    both eyes   Depression    Fatty liver    "pre fatty liver"   Fibromyalgia    Headache(784.0)    hx of migraines when younger   Heart palpitations    hx with normal holter event monitoring   Hx: UTI (urinary tract infection)    Hyperlipidemia    Hypertension    Hypothyroidism    Obesity    Personal history of chemotherapy    Personal history of radiation therapy    Pneumonia 1972   hx of   Polyp of colon    Serrated adenoma of colon 08/2012   Skin cancer    basal, squamous cell   Sleep apnea    uses cpap    SURGICAL HISTORY: Past Surgical History:  Procedure Laterality Date   BREAST LUMPECTOMY     BREAST LUMPECTOMY WITH RADIOACTIVE SEED AND SENTINEL LYMPH NODE BIOPSY Right  07/26/2020   Procedure: RIGHT BREAST LUMPECTOMY WITH RADIOACTIVE SEED AND SENTINEL LYMPH NODE BIOPSY;  Surgeon: Jovita Kussmaul, MD;  Location: [place];  Service: General;  Laterality: Right;   BREAST LUMPECTOMY WITH RADIOACTIVE SEED LOCALIZATION Left 07/26/2020   Procedure: LEFT BREAST LUMPECTOMY X 2  WITH RADIOACTIVE SEED LOCALIZATION;  Surgeon: Jovita Kussmaul, MD;  Location: [place];  Service: General;  Laterality: Left;   COLONOSCOPY     DILATION AND CURETTAGE OF [place] ARTHROSCOPY     both in past   POLYPECTOMY     SHOULDER OPEN [place] Left 07/07/2013   Procedure: LEFT SHOULDER MINI OPEN SUBACROMIAL DECOMPRESSION ROTATOR CUFF REPAIR AND POSSIBLE PATCH GRAFT ;  Surgeon: Johnn Hai, MD;  Location: [facility];  Service: Orthopedics;  Laterality: Left;  with interscaline block   SKIN CANCER EXCISION Bilateral    arm, legs, and chest   TONSILLECTOMY      I have reviewed the social history and family history with  the patient and they are unchanged from previous note.  ALLERGIES:  is allergic to penicillins, taxotere [docetaxel], cefdinir, norvasc [amlodipine besylate], cetirizine, irbesartan, molds & smuts, pegfilgrastim, and tylenol [acetaminophen].  MEDICATIONS:  Current Outpatient Medications  Medication Sig Dispense Refill   letrozole (FEMARA) 2.5 MG tablet Take 0.5 tablets (1.25 mg total) by mouth daily. 30 tablet 3   amLODipine (NORVASC) 5 MG tablet amlodipine 5 mg tablet  TAKE 1 TABLET BY MOUTH ONCE DAILY     Ascorbic Acid (VITAMIN C) 1000 MG tablet Take 1,000 mg by mouth daily.     b complex vitamins capsule Take 1 capsule by mouth daily.     Bioflavonoid Products (QUERCETIN COMPLEX IMMUNE PO) Take 1 capsule by mouth as needed.     Cholecalciferol (VITAMIN D) 50 MCG (2000 UT) tablet Take 4,000 Units by mouth daily.     hydrochlorothiazide (HYDRODIURIL) 25 MG tablet TAKE 1 TABLET BY MOUTH EVERY DAY (Patient taking differently: Take 12.5 mg by mouth daily.) 90 tablet 0   MAGNESIUM CARBONATE PO Take 1 tablet by mouth daily.     melatonin 5 MG TABS Take 5 mg by mouth at bedtime.     Menaquinone-7 (VITAMIN K2) 100 MCG CAPS Take 100 mcg by mouth daily.     Multiple Vitamin (MULTIVITAMIN) tablet Take 1 tablet by mouth daily.     NON FORMULARY Take 48.6 mg by mouth See admin instructions. Thyroid desiccated (procine) sr 2 capsules 2 days a week (Sat and Sun), 3 capsules (Mon through Friday)     OLIVE LEAF EXTRACT PO Take 1 capsule by mouth daily.     OVER THE COUNTER MEDICATION Take 2 capsules by mouth in the morning and at bedtime. Infla Med     OVER THE COUNTER MEDICATION Take 1 capsule by mouth daily. Essential Pro     potassium chloride SA (KLOR-CON M) 20 MEQ tablet TAKE ONE TABLET BY MOUTH ONE TIME DAILY 90 tablet 3   Probiotic Product (PROBIOTIC PO) Take 1 capsule by mouth daily.     UBIQUINOL PO Take by mouth.     ZINC GLUCONATE PO Take 10 mg by mouth daily.     No current  facility-administered medications for this visit.    PHYSICAL EXAMINATION: ECOG PERFORMANCE STATUS: 1 - Symptomatic but completely ambulatory  Vitals:   10/22/21 0852  BP: 129/66  Pulse: 74  Resp: 17  Temp: 98.1 F (  36.7 C)  SpO2: 100%   Filed Weights   10/22/21 0852  Weight: 210 lb (95.3 kg)    GENERAL:alert, no distress and comfortable SKIN: No rash EYES: sclera clear NECK: Without mass LYMPH:  no palpable cervical or supraclavicular lymphadenopathy LUNGS:  normal breathing effort HEART: no lower extremity edema ABDOMEN:abdomen soft, non-tender and normal bowel sounds NEURO: alert & oriented x 3 with fluent speech, no focal motor deficits Breast exam: No bilateral nipple discharge or inversion.  S/p right lumpectomy, incisions completely healed.  Mild lymphedema and TTP in the central breast.  No palpable mass or nodularity in either breast or axilla that I could appreciate.  LABORATORY DATA:  I have reviewed the data as listed    Latest Ref Rng & Units 10/22/2021    8:27 AM 07/10/2021   10:28 AM 04/11/2021    1:40 PM  CBC  WBC 4.0 - 10.5 K/uL 6.0  5.8  7.6   Hemoglobin 12.0 - 15.0 g/dL 13.1  13.1  13.1   Hematocrit 36.0 - 46.0 % 38.9  39.6  40.3   Platelets 150 - 400 K/uL 186  194  200         Latest Ref Rng & Units 10/22/2021    8:27 AM 07/10/2021   10:28 AM 04/11/2021    1:40 PM  CMP  Glucose 70 - 99 mg/dL 93  89  88   BUN 8 - 23 mg/dL $Remove'15  23  18   'WpcGViv$ Creatinine 0.44 - 1.00 mg/dL 0.73  0.78  0.78   Sodium 135 - 145 mmol/L 142  141  141   Potassium 3.5 - 5.1 mmol/L 3.7  3.9  3.4   Chloride 98 - 111 mmol/L 107  105  103   CO2 22 - 32 mmol/L $RemoveB'28  30  28   'SaefRrtJ$ Calcium 8.9 - 10.3 mg/dL 9.7  9.7  9.6   Total Protein 6.5 - 8.1 g/dL 7.2  7.1  7.6   Total Bilirubin 0.3 - 1.2 mg/dL 0.6  0.6  0.5   Alkaline Phos 38 - 126 U/L 104  101  117   AST 15 - 41 U/L 32  26  26   ALT 0 - 44 U/L 46  30  33       RADIOGRAPHIC STUDIES: I have personally reviewed the radiological  images as listed and agreed with the findings in the report. No results found.   ASSESSMENT & PLAN: [name] is a 66 y.o. female with      1.Symptom management: Generalized and right shoulder joint pain, breast/muscle pain, fatigue, brain fog, depressed mood, elevated BP -tolerated exemestane from 03/2021 until 08/2021 with intolerable side effects including generalized bone/joint pain, breast/pectoralis pain, fatigue, brain fog, mood disturbance and elevated BP.   -stopped exemestane 08/2021 and SE's have essentially resolved. She much better overall -we have reviewed her surgical path in detail and given the strong ER/PR positivity and high risk features, I do recommend to try another AI or tamoxifen. She has done extensive research and is comfortable with letrozole which I prescribed today.  -she is requesting baseline hormone levels prior to starting, drawn today -she will start at 1/2 dose and f/up in 8 weeks to evaluate    2. 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  -  Mammaprint showed high risk Luminal Type B with 29% risk of distant recurrence.  -She completed adjuvant chemo with TC.  Due to allergy reaction to docetaxel, it was changed to Abraxane from cycle 3. -She received adjuvant radiation under Dr. Isidore Moos 7/25-01/15/21. -she started exemestane in late 01/2021. She is receiving financial assistance through [employer]" (see MyChart note from 06/11/21). -most recent mammogram 06/10/21 was benign. -surveillance -exemestane stopped 08/2021 for SE's, see #1. Will try low dose letrozole next    3. Bone Health -she reports her last DEXA was in 03/2019, showing osteopenia.  -repeat per Dr. Radene Knee   4. Comorbidities: Depression, Fibromyalgia, HLD, HTN, Hypothyroidism, H/o Skin  cancer (squamous and basal cell), sleep apnea -She is followed for HTN. She takes her BP at home.  -On medications, continue to f/u with her other physicians.    5.  Fatty liver and transaminitis -She has chronic intermittent transaminitis, attributes to fatty liver.  She is on low sugar diet and drinks a lot of water -Liver morphology was unremarkable on recent CT scan -She does not drink alcohol, she knows to avoid Tylenol. -Her LFTs fluctuate, recently normal per patient  PLAN: -labs today, check hormone levels -start letrozole at 50% dose -F/up PCP and ortho as planned -Close f/up in 8 weeks to evaluate response to AI -case discussed with Dr. Burr Medico    Orders Placed This Encounter  Procedures   Vitamin D 25 hydroxy    Standing Status:   Standing    Number of Occurrences:   1    Standing Expiration Date:   10/23/2022   Estradiol    Standing Status:   Standing    Number of Occurrences:   1    Standing Expiration Date:   10/23/2022   Testosterone    Standing Status:   Standing    Number of Occurrences:   1    Standing Expiration Date:   10/23/2022   Progesterone    Standing Status:   Standing    Number of Occurrences:   1    Standing Expiration Date:   10/23/2022   All questions were answered. The patient knows to call the clinic with any problems, questions or concerns. No barriers to learning was detected. I spent 25 minutes counseling the patient face to face. The total time spent in the appointment was 40 minutes and more than 50% was on counseling and review of test results and coordination of care.      Alla Feeling, NP 10/22/21

## 2021-10-22 ENCOUNTER — Inpatient Hospital Stay: Payer: Medicare HMO | Attending: Hematology | Admitting: Nurse Practitioner

## 2021-10-22 ENCOUNTER — Other Ambulatory Visit: Payer: Self-pay

## 2021-10-22 ENCOUNTER — Encounter: Payer: Self-pay | Admitting: Nurse Practitioner

## 2021-10-22 ENCOUNTER — Inpatient Hospital Stay: Payer: Medicare HMO

## 2021-10-22 VITALS — BP 129/66 | HR 74 | Temp 98.1°F | Resp 17 | Ht 60.0 in | Wt 210.0 lb

## 2021-10-22 DIAGNOSIS — M8580 Other specified disorders of bone density and structure, unspecified site: Secondary | ICD-10-CM | POA: Insufficient documentation

## 2021-10-22 DIAGNOSIS — K760 Fatty (change of) liver, not elsewhere classified: Secondary | ICD-10-CM | POA: Insufficient documentation

## 2021-10-22 DIAGNOSIS — D0512 Intraductal carcinoma in situ of left breast: Secondary | ICD-10-CM

## 2021-10-22 DIAGNOSIS — I10 Essential (primary) hypertension: Secondary | ICD-10-CM | POA: Insufficient documentation

## 2021-10-22 DIAGNOSIS — Z79899 Other long term (current) drug therapy: Secondary | ICD-10-CM | POA: Insufficient documentation

## 2021-10-22 DIAGNOSIS — Z85828 Personal history of other malignant neoplasm of skin: Secondary | ICD-10-CM | POA: Diagnosis not present

## 2021-10-22 DIAGNOSIS — E785 Hyperlipidemia, unspecified: Secondary | ICD-10-CM | POA: Insufficient documentation

## 2021-10-22 DIAGNOSIS — C50411 Malignant neoplasm of upper-outer quadrant of right female breast: Secondary | ICD-10-CM

## 2021-10-22 DIAGNOSIS — Z170 Estrogen receptor positive status [ER+]: Secondary | ICD-10-CM | POA: Insufficient documentation

## 2021-10-22 DIAGNOSIS — E039 Hypothyroidism, unspecified: Secondary | ICD-10-CM | POA: Insufficient documentation

## 2021-10-22 DIAGNOSIS — R7401 Elevation of levels of liver transaminase levels: Secondary | ICD-10-CM | POA: Insufficient documentation

## 2021-10-22 DIAGNOSIS — M4712 Other spondylosis with myelopathy, cervical region: Secondary | ICD-10-CM | POA: Diagnosis not present

## 2021-10-22 DIAGNOSIS — M797 Fibromyalgia: Secondary | ICD-10-CM | POA: Insufficient documentation

## 2021-10-22 DIAGNOSIS — Z79811 Long term (current) use of aromatase inhibitors: Secondary | ICD-10-CM | POA: Diagnosis not present

## 2021-10-22 DIAGNOSIS — E669 Obesity, unspecified: Secondary | ICD-10-CM | POA: Insufficient documentation

## 2021-10-22 DIAGNOSIS — G629 Polyneuropathy, unspecified: Secondary | ICD-10-CM | POA: Diagnosis not present

## 2021-10-22 DIAGNOSIS — G4730 Sleep apnea, unspecified: Secondary | ICD-10-CM | POA: Diagnosis not present

## 2021-10-22 LAB — CMP (CANCER CENTER ONLY)
ALT: 46 U/L — ABNORMAL HIGH (ref 0–44)
AST: 32 U/L (ref 15–41)
Albumin: 4.4 g/dL (ref 3.5–5.0)
Alkaline Phosphatase: 104 U/L (ref 38–126)
Anion gap: 7 (ref 5–15)
BUN: 15 mg/dL (ref 8–23)
CO2: 28 mmol/L (ref 22–32)
Calcium: 9.7 mg/dL (ref 8.9–10.3)
Chloride: 107 mmol/L (ref 98–111)
Creatinine: 0.73 mg/dL (ref 0.44–1.00)
GFR, Estimated: >60 mL/min (ref >60)
Glucose, Bld: 93 mg/dL (ref 70–99)
Potassium: 3.7 mmol/L (ref 3.5–5.1)
Sodium: 142 mmol/L (ref 135–145)
Total Bilirubin: 0.6 mg/dL (ref 0.3–1.2)
Total Protein: 7.2 g/dL (ref 6.5–8.1)

## 2021-10-22 LAB — CBC WITH DIFFERENTIAL (CANCER CENTER ONLY)
Abs Immature Granulocytes: 0.02 10*3/uL (ref 0.00–0.07)
Basophils Absolute: 0 10*3/uL (ref 0.0–0.1)
Basophils Relative: 1 %
Eosinophils Absolute: 0.2 10*3/uL (ref 0.0–0.5)
Eosinophils Relative: 3 %
HCT: 38.9 % (ref 36.0–46.0)
Hemoglobin: 13.1 g/dL (ref 12.0–15.0)
Immature Granulocytes: 0 %
Lymphocytes Relative: 27 %
Lymphs Abs: 1.6 10*3/uL (ref 0.7–4.0)
MCH: 30.5 pg (ref 26.0–34.0)
MCHC: 33.7 g/dL (ref 30.0–36.0)
MCV: 90.7 fL (ref 80.0–100.0)
Monocytes Absolute: 0.5 10*3/uL (ref 0.1–1.0)
Monocytes Relative: 8 %
Neutro Abs: 3.6 10*3/uL (ref 1.7–7.7)
Neutrophils Relative %: 61 %
Platelet Count: 186 10*3/uL (ref 150–400)
RBC: 4.29 MIL/uL (ref 3.87–5.11)
RDW: 13.6 % (ref 11.5–15.5)
WBC Count: 6 10*3/uL (ref 4.0–10.5)
nRBC: 0 % (ref 0.0–0.2)

## 2021-10-22 LAB — VITAMIN D 25 HYDROXY (VIT D DEFICIENCY, FRACTURES): Vit D, 25-Hydroxy: 51.73 ng/mL (ref 30–100)

## 2021-10-22 MED ORDER — LETROZOLE 2.5 MG PO TABS: 1.2500 mg | ORAL_TABLET | Freq: Every day | ORAL | 3 refills | Status: DC

## 2021-10-23 DIAGNOSIS — G4733 Obstructive sleep apnea (adult) (pediatric): Secondary | ICD-10-CM | POA: Diagnosis not present

## 2021-10-23 LAB — PROGESTERONE: Progesterone: 0.2 ng/mL

## 2021-10-23 LAB — CBC AND DIFFERENTIAL
HCT: 42 (ref 36–46)
Hemoglobin: 13.9 (ref 12.0–16.0)
Platelets: 226 10*3/uL (ref 150–400)
WBC: 8.3

## 2021-10-23 LAB — ESTRADIOL: Estradiol: 17.2 pg/mL

## 2021-10-23 LAB — TESTOSTERONE: Testosterone: 10 ng/dL (ref 3–67)

## 2021-10-24 ENCOUNTER — Encounter: Payer: Self-pay | Admitting: Nurse Practitioner

## 2021-10-24 DIAGNOSIS — M542 Cervicalgia: Secondary | ICD-10-CM | POA: Diagnosis not present

## 2021-10-24 DIAGNOSIS — M545 Low back pain, unspecified: Secondary | ICD-10-CM | POA: Diagnosis not present

## 2021-10-25 ENCOUNTER — Encounter: Payer: Self-pay | Admitting: Nurse Practitioner

## 2021-10-31 DIAGNOSIS — M542 Cervicalgia: Secondary | ICD-10-CM | POA: Diagnosis not present

## 2021-10-31 DIAGNOSIS — M9903 Segmental and somatic dysfunction of lumbar region: Secondary | ICD-10-CM | POA: Diagnosis not present

## 2021-10-31 DIAGNOSIS — M9902 Segmental and somatic dysfunction of thoracic region: Secondary | ICD-10-CM | POA: Diagnosis not present

## 2021-10-31 DIAGNOSIS — M531 Cervicobrachial syndrome: Secondary | ICD-10-CM | POA: Diagnosis not present

## 2021-10-31 DIAGNOSIS — M9901 Segmental and somatic dysfunction of cervical region: Secondary | ICD-10-CM | POA: Diagnosis not present

## 2021-10-31 DIAGNOSIS — M545 Low back pain, unspecified: Secondary | ICD-10-CM | POA: Diagnosis not present

## 2021-11-05 ENCOUNTER — Other Ambulatory Visit: Payer: Self-pay | Admitting: Nurse Practitioner

## 2021-11-05 DIAGNOSIS — C50411 Malignant neoplasm of upper-outer quadrant of right female breast: Secondary | ICD-10-CM

## 2021-11-06 ENCOUNTER — Encounter: Payer: Self-pay | Admitting: Internal Medicine

## 2021-11-06 DIAGNOSIS — M5412 Radiculopathy, cervical region: Secondary | ICD-10-CM | POA: Diagnosis not present

## 2021-11-07 ENCOUNTER — Telehealth (INDEPENDENT_AMBULATORY_CARE_PROVIDER_SITE_OTHER): Payer: Medicare HMO | Admitting: Internal Medicine

## 2021-11-07 ENCOUNTER — Encounter: Payer: Self-pay | Admitting: Internal Medicine

## 2021-11-07 DIAGNOSIS — R5383 Other fatigue: Secondary | ICD-10-CM | POA: Diagnosis not present

## 2021-11-07 DIAGNOSIS — W57XXXA Bitten or stung by nonvenomous insect and other nonvenomous arthropods, initial encounter: Secondary | ICD-10-CM | POA: Diagnosis not present

## 2021-11-07 DIAGNOSIS — S0096XA Insect bite (nonvenomous) of unspecified part of head, initial encounter: Secondary | ICD-10-CM

## 2021-11-07 DIAGNOSIS — K760 Fatty (change of) liver, not elsewhere classified: Secondary | ICD-10-CM | POA: Diagnosis not present

## 2021-11-07 DIAGNOSIS — C50919 Malignant neoplasm of unspecified site of unspecified female breast: Secondary | ICD-10-CM | POA: Diagnosis not present

## 2021-11-07 DIAGNOSIS — E782 Mixed hyperlipidemia: Secondary | ICD-10-CM | POA: Diagnosis not present

## 2021-11-07 DIAGNOSIS — I10 Essential (primary) hypertension: Secondary | ICD-10-CM | POA: Diagnosis not present

## 2021-11-07 NOTE — Progress Notes (Signed)
   Virtual Visit via Telephone Note  I connected with@ on 11/07/21 at  9:00 AM EDT by telephone and verified that I am speaking with the correct person using two identifiers.   I discussed the limitations, risks, security and privacy concerns of performing an evaluation and management service by telephone and the limited availability of in person appointments. tThere may be a patient responsible charge related to this service. The patient expressed understanding and agreed to proceed.  Location patient: home Location provider:  home office Participants present for the call: patient, provider Patient did not have a visit in the prior 7 days to address this/these issue(s).   History of Present Illness: [name] was outside yard a few days ago and then today noticed a tick attached that was not very big and she removed fully with the head.  Near her glasses.  No significant rash or fever on examination she thinks it is a [employer] tick. No systemic symptoms just asked for advice.  Her sister had chronic Lyme that was not diagnosed.   Observations/Objective: Patient sounds personable and well on the phone. I do not appreciate any SOB. Speech and thought processing are grossly intact. Patient reported vitals:   Assessment and Plan: Tick bite, unspecified site, initial encounter - prob  lone star  not engorged removed  advised observation for fever rash other sx and contact medical team   Take pictures if needed.  Tick avoidance and monitoring. Follow Up Instructions:  Contact medical team if fever systemic symptoms unusual rash etc.  Otherwise as needed   [id number] [id number] [id number] 11-20 [id number] 21-30 I did not refer this patient for an OV in the next 24 hours for this/these issue(s).  I discussed the assessment and treatment plan with the patient. The patient was provided an opportunity to ask questions and answered. The patient agreed with the plan and demonstrated an understanding  of the instructions.   The patient was advised to call back or seek an in-person evaluation if the symptoms worsen or if the condition fails to improve as anticipated.  I provided 11 minutes of non-face-to-face time during this encounter. No follow-ups on file.  Shanon Ace, MD

## 2021-11-08 DIAGNOSIS — M542 Cervicalgia: Secondary | ICD-10-CM | POA: Diagnosis not present

## 2021-11-08 DIAGNOSIS — M545 Low back pain, unspecified: Secondary | ICD-10-CM | POA: Diagnosis not present

## 2021-11-11 ENCOUNTER — Ambulatory Visit: Payer: Medicare HMO

## 2021-11-13 DIAGNOSIS — G4733 Obstructive sleep apnea (adult) (pediatric): Secondary | ICD-10-CM | POA: Diagnosis not present

## 2021-11-15 DIAGNOSIS — M545 Low back pain, unspecified: Secondary | ICD-10-CM | POA: Diagnosis not present

## 2021-11-15 DIAGNOSIS — M542 Cervicalgia: Secondary | ICD-10-CM | POA: Diagnosis not present

## 2021-11-18 ENCOUNTER — Encounter: Payer: Self-pay | Admitting: Nurse Practitioner

## 2021-11-18 DIAGNOSIS — G894 Chronic pain syndrome: Secondary | ICD-10-CM | POA: Diagnosis not present

## 2021-11-20 DIAGNOSIS — M9902 Segmental and somatic dysfunction of thoracic region: Secondary | ICD-10-CM | POA: Diagnosis not present

## 2021-11-20 DIAGNOSIS — M9903 Segmental and somatic dysfunction of lumbar region: Secondary | ICD-10-CM | POA: Diagnosis not present

## 2021-11-20 DIAGNOSIS — M531 Cervicobrachial syndrome: Secondary | ICD-10-CM | POA: Diagnosis not present

## 2021-11-20 DIAGNOSIS — M9901 Segmental and somatic dysfunction of cervical region: Secondary | ICD-10-CM | POA: Diagnosis not present

## 2021-11-22 ENCOUNTER — Telehealth: Payer: Self-pay

## 2021-11-22 DIAGNOSIS — M542 Cervicalgia: Secondary | ICD-10-CM | POA: Diagnosis not present

## 2021-11-22 DIAGNOSIS — M545 Low back pain, unspecified: Secondary | ICD-10-CM | POA: Diagnosis not present

## 2021-11-22 NOTE — Telephone Encounter (Signed)
Called to discuss PREP program options at [facility], left voicemail.

## 2021-11-25 ENCOUNTER — Telehealth: Payer: Self-pay | Admitting: Hematology

## 2021-11-25 ENCOUNTER — Telehealth: Payer: Self-pay

## 2021-11-25 ENCOUNTER — Other Ambulatory Visit: Payer: Self-pay

## 2021-11-25 ENCOUNTER — Ambulatory Visit: Payer: Medicare HMO | Attending: Nurse Practitioner

## 2021-11-25 VITALS — Wt 208.5 lb

## 2021-11-25 DIAGNOSIS — Z483 Aftercare following surgery for neoplasm: Secondary | ICD-10-CM | POA: Insufficient documentation

## 2021-11-25 DIAGNOSIS — Z809 Family history of malignant neoplasm, unspecified: Secondary | ICD-10-CM | POA: Diagnosis not present

## 2021-11-25 NOTE — Therapy (Addendum)
OUTPATIENT PHYSICAL THERAPY SOZO SCREENING NOTE   Patient Name: [name] MRN: [id number] DOB:03-13-1956, 66 y.o., female Today's Date: 11/25/2021  PCP: Burnis Medin, MD REFERRING PROVIDER: Alla Feeling, NP   PT End of Session - 11/25/21 0845     Visit Number 3   # unchanged due to screen only   PT Start Time 0843    PT Stop Time 0854    PT Time Calculation (min) 11 min    Activity Tolerance Patient tolerated treatment well    Behavior During Therapy [facility] for tasks assessed/performed             Past Medical History:  Diagnosis Date   Anal fissure    Atrial septal aneurysm / if pfo  echo 6 13  10/23/2011   Breast cancer ([place])    Cataract    both eyes   Depression    Fatty liver    "pre fatty liver"   Fibromyalgia    Headache(784.0)    hx of migraines when younger   Heart palpitations    hx with normal holter event monitoring   Hx: UTI (urinary tract infection)    Hyperlipidemia    Hypertension    Hypothyroidism    Obesity    Personal history of chemotherapy    Personal history of radiation therapy    Pneumonia 1972   hx of   Polyp of colon    Serrated adenoma of colon 08/2012   Skin cancer    basal, squamous cell   Sleep apnea    uses cpap   Past Surgical History:  Procedure Laterality Date   BREAST LUMPECTOMY     BREAST LUMPECTOMY WITH RADIOACTIVE SEED AND SENTINEL LYMPH NODE BIOPSY Right 07/26/2020   Procedure: RIGHT BREAST LUMPECTOMY WITH RADIOACTIVE SEED AND SENTINEL LYMPH NODE BIOPSY;  Surgeon: Jovita Kussmaul, MD;  Location: [place];  Service: General;  Laterality: Right;   BREAST LUMPECTOMY WITH RADIOACTIVE SEED LOCALIZATION Left 07/26/2020   Procedure: LEFT BREAST LUMPECTOMY X 2  WITH RADIOACTIVE SEED LOCALIZATION;  Surgeon: Jovita Kussmaul, MD;  Location: [place];  Service: General;  Laterality: Left;   COLONOSCOPY     DILATION AND CURETTAGE OF [place] ARTHROSCOPY     both in past    POLYPECTOMY     [place] Left 07/07/2013   Procedure: LEFT SHOULDER MINI OPEN SUBACROMIAL DECOMPRESSION ROTATOR CUFF REPAIR AND POSSIBLE PATCH GRAFT ;  Surgeon: Johnn Hai, MD;  Location: [facility];  Service: Orthopedics;  Laterality: Left;  with interscaline block   SKIN CANCER EXCISION Bilateral    arm, legs, and chest   TONSILLECTOMY     Patient Active Problem List   Diagnosis Date Noted   Malignant neoplasm of upper-outer quadrant of right breast in female, estrogen receptor positive ([place]) 06/25/2020   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   Hepatic steatosis 11/27/2014   Hx of adenomatous colonic polyps 11/27/2014   Visit for preventive health examination 03/28/2014   Hyperlipidemia 03/28/2014   Left rotator cuff tear 07/07/2013   Tear of left rotator cuff 07/07/2013   Rhinitis 06/01/2013   Chest wall deformity 04/12/2012   Atrial septal aneurysm / if pfo  echo 6 13  10/23/2011   OSA (obstructive sleep apnea) 11/27/2010   Dyslipidemia 11/01/2010   [place] 08/29/2010   Labile hypertension 08/29/2010   MUSCLE CRAMPS, FOOT 06/10/2010  OTHER SLEEP DISTURBANCES 06/10/2010   ANXIETY, SITUATIONAL 05/08/2010   VERTIGO, POSITIONAL 03/15/2010   VITAMIN D DEFICIENCY 02/18/2010   OBESITY 02/18/2010   ALLERGIC RHINITIS 02/18/2010   PLANTAR FASCIITIS 02/18/2010   TWITCHING 06/11/2009   NUMBNESS, HAND 06/11/2009   CERVICAL STRAIN, ACUTE 06/11/2009   OTHER MALAISE AND FATIGUE 07/09/2007   HYPERTENSION 01/28/2007   Hypothyroidism 11/11/2006   COMMON MIGRAINE 11/11/2006   GERD 11/11/2006   FIBROMYALGIA 11/11/2006    REFERRING DIAG: Rt breast cancer  THERAPY DIAG: Aftercare following surgery for neoplasm  PERTINENT HISTORY:   Patient was diagnosed on 05/22/2020 with 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 She underwent a left lumpectomy for DCIS and a right lumpectomy and sentinel node biopsy on 07/26/2020. She had  2 lymph nodes removed with 1 being positive for cancer on the right side. It is ER/PR positive. Left shoulder rotator cuff repair 06/2013. Last radiation on 01/15/21.  Seroma that was drained x 2-3.     PRECAUTIONS: Rt UE lymphedema risk  SUBJECTIVE: Pt returns for her 3 month L-Dex screen. "I'm still trying to decrease my fatigue since finishing radiation."  PAIN: Are you having pain? No  SOZO SCREENING: Patient was assessed today using the SOZO machine to determine the lymphedema index score. This was compared to her baseline score. It was determined that she is within the recommended range when compared to her baseline and no further action is needed at this time. She will return in 3 months for her next SOZO screen.  Plan: Will continue 3 month SOZO screens until at least 07/27/22   L-DEX FLOWSHEETS - 11/25/21 0800       L-DEX LYMPHEDEMA SCREENING   Measurement Type Unilateral    L-DEX MEASUREMENT EXTREMITY Upper Extremity    POSITION  Standing    DOMINANT SIDE Right    At Risk Side Right    BASELINE SCORE (UNILATERAL) 3.5    L-DEX SCORE (UNILATERAL) 4.1    VALUE CHANGE (UNILAT) 0.6              [name], PTA 11/25/21 8:54 AM

## 2021-11-25 NOTE — Telephone Encounter (Signed)
Patient called to move upcoming appointment due to a scheduling conflict. Patient is aware of changes.

## 2021-11-25 NOTE — Telephone Encounter (Signed)
Returning her call, she wants to attend PREP classes at [facility] next class on July 31, every M/W 8:30-9:45; assessment visit scheduled for July 25 at 2pm.

## 2021-11-28 DIAGNOSIS — R202 Paresthesia of skin: Secondary | ICD-10-CM | POA: Diagnosis not present

## 2021-11-28 DIAGNOSIS — E559 Vitamin D deficiency, unspecified: Secondary | ICD-10-CM | POA: Diagnosis not present

## 2021-11-28 DIAGNOSIS — M5412 Radiculopathy, cervical region: Secondary | ICD-10-CM | POA: Diagnosis not present

## 2021-11-28 DIAGNOSIS — G603 Idiopathic progressive neuropathy: Secondary | ICD-10-CM | POA: Diagnosis not present

## 2021-11-28 DIAGNOSIS — G609 Hereditary and idiopathic neuropathy, unspecified: Secondary | ICD-10-CM | POA: Diagnosis not present

## 2021-12-02 ENCOUNTER — Other Ambulatory Visit: Payer: Self-pay

## 2021-12-03 NOTE — Progress Notes (Signed)
[facility] PREP Evaluation  Patient Details  Name: [name] MRN: [id number] Date of Birth: October 20, 1955 Age: 66 y.o. PCP: Burnis Medin, MD  Vitals:   12/03/21 1426  BP: (!) 148/60  Pulse: 81  SpO2: 96%  Weight: 206 lb 12.8 oz (93.8 kg)     [facility] Eval - 12/03/21 1400       [facility] "PREP" Location   [facility] "PREP" Location [facility]      Referral    Referring Provider [facility]    Reason for referral Hypertension;Obesitity/Overweight    Program Start Date 12/09/21      Measurement   Waist Circumference 39.25 inches    Hip Circumference 44.34 inches    Body fat 43.8 percent      Information for Trainer   Goals --   Lose 10-15 pounds by end of program; establish a fun exercise routine   Current Exercise walking    Orthopedic Concerns --   neuropathy in feet; spinal cyst, cerv disk inpingement   Pertinent Medical History --   R breast CA, HTN     Timed Up and Go (TUGS)   Timed Up and Go Low risk <9 seconds      Mobility and Daily Activities   I find it easy to walk up or down two or more flights of stairs. 1    I have no trouble taking out the trash. 3    I do housework such as vacuuming and dusting on my own without difficulty. 2    I can easily lift a gallon of milk (8lbs). 4    I can easily walk a mile. 1    I have no trouble reaching into high cupboards or reaching down to pick up something from the floor. 4    I do not have trouble doing out-door work such as Armed forces logistics/support/administrative officer, raking leaves, or gardening. 2      Mobility and Daily Activities   I feel younger than my age. 4    I feel independent. 4    I feel energetic. 3    I live an active life.  2    I feel strong. 2    I feel healthy. 2    I feel active as other people my age. 3      How fit and strong are you.   Fit and Strong Total Score 37            Past Medical History:  Diagnosis Date   Anal fissure    Atrial septal aneurysm / if pfo  echo 6 13  10/23/2011   Breast cancer ([place])     Cataract    both eyes   Depression    Fatty liver    "pre fatty liver"   Fibromyalgia    Headache(784.0)    hx of migraines when younger   Heart palpitations    hx with normal holter event monitoring   Hx: UTI (urinary tract infection)    Hyperlipidemia    Hypertension    Hypothyroidism    Obesity    Personal history of chemotherapy    Personal history of radiation therapy    Pneumonia 1972   hx of   Polyp of colon    Serrated adenoma of colon 08/2012   Skin cancer    basal, squamous cell   Sleep apnea    uses cpap   Past Surgical History:  Procedure Laterality Date   BREAST LUMPECTOMY  BREAST LUMPECTOMY WITH RADIOACTIVE SEED AND SENTINEL LYMPH NODE BIOPSY Right 07/26/2020   Procedure: RIGHT BREAST LUMPECTOMY WITH RADIOACTIVE SEED AND SENTINEL LYMPH NODE BIOPSY;  Surgeon: Jovita Kussmaul, MD;  Location: [place];  Service: General;  Laterality: Right;   BREAST LUMPECTOMY WITH RADIOACTIVE SEED LOCALIZATION Left 07/26/2020   Procedure: LEFT BREAST LUMPECTOMY X 2  WITH RADIOACTIVE SEED LOCALIZATION;  Surgeon: Jovita Kussmaul, MD;  Location: [place];  Service: General;  Laterality: Left;   COLONOSCOPY     DILATION AND CURETTAGE OF [place] ARTHROSCOPY     both in past   POLYPECTOMY     SHOULDER OPEN [place] Left 07/07/2013   Procedure: LEFT SHOULDER MINI OPEN SUBACROMIAL DECOMPRESSION ROTATOR CUFF REPAIR AND POSSIBLE PATCH GRAFT ;  Surgeon: Johnn Hai, MD;  Location[facility];  Service: Orthopedics;  Laterality: Left;  with interscaline block   SKIN CANCER EXCISION Bilateral    arm, legs, and chest   TONSILLECTOMY     Social History   Tobacco Use  Smoking Status Never  Smokeless Tobacco Never  To begin PREP class at [facility] July 31, every M/W 8:30-9:45a  Yevonne Aline 12/03/2021, 2:30 PM

## 2021-12-09 ENCOUNTER — Encounter: Payer: Self-pay | Admitting: *Deleted

## 2021-12-10 ENCOUNTER — Telehealth: Payer: Self-pay | Admitting: Pharmacist

## 2021-12-10 NOTE — Chronic Care Management (AMB) (Signed)
    Chronic Care Management Pharmacy Assistant   Name: [name]  MRN: [id number] DOB: January 06, 1956  12/10/21 APPOINTMENT REMINDER   Called Patient No answer, left message of appointment on 12/11/21 at 11:30 via telephone visit with Jeni Salles, Pharm D.   Notified to have all medications, supplements, blood pressure and/or blood sugar logs available during appointment and to return call if need to reschedule.     Care Gaps: COVID Vaccine - Overdue Zoster Vaccine - Overdue Flu Vaccine - Overdue PNA Vaccine - Postponed BP- 129/66 10/22/21 AWV- 05/28/21  Star Rating Drug: none    Medications: Outpatient Encounter Medications as of 12/10/2021  Medication Sig   amLODipine (NORVASC) 5 MG tablet amlodipine 5 mg tablet  TAKE 1 TABLET BY MOUTH ONCE DAILY   Ascorbic Acid (VITAMIN C) 1000 MG tablet Take 1,000 mg by mouth daily.   b complex vitamins capsule Take 1 capsule by mouth daily.   Bioflavonoid Products (QUERCETIN COMPLEX IMMUNE PO) Take 1 capsule by mouth as needed.   Cholecalciferol (VITAMIN D) 50 MCG (2000 UT) tablet Take 4,000 Units by mouth daily.   hydrochlorothiazide (HYDRODIURIL) 25 MG tablet TAKE 1 TABLET BY MOUTH EVERY DAY (Patient taking differently: Take 12.5 mg by mouth daily.)   letrozole (FEMARA) 2.5 MG tablet Take 0.5 tablets (1.25 mg total) by mouth daily.   MAGNESIUM CARBONATE PO Take 1 tablet by mouth daily.   melatonin 5 MG TABS Take 5 mg by mouth at bedtime.   Menaquinone-7 (VITAMIN K2) 100 MCG CAPS Take 100 mcg by mouth daily.   Multiple Vitamin (MULTIVITAMIN) tablet Take 1 tablet by mouth daily.   NON FORMULARY Take 48.6 mg by mouth See admin instructions. Thyroid desiccated (procine) sr 2 capsules 2 days a week (Sat and Sun), 3 capsules (Mon through Friday)   OLIVE LEAF EXTRACT PO Take 1 capsule by mouth daily.   OVER THE COUNTER MEDICATION Take 2 capsules by mouth in the morning and at bedtime. Infla Med   OVER THE COUNTER MEDICATION Take 1  capsule by mouth daily. Essential Pro   potassium chloride SA (KLOR-CON M) 20 MEQ tablet TAKE ONE TABLET BY MOUTH ONE TIME DAILY   Probiotic Product (PROBIOTIC PO) Take 1 capsule by mouth daily.   UBIQUINOL PO Take by mouth.   ZINC GLUCONATE PO Take 10 mg by mouth daily.   No facility-administered encounter medications on file as of 12/10/2021.      [place] Clinical Pharmacist Assistant [phone]

## 2021-12-11 ENCOUNTER — Ambulatory Visit: Payer: Medicare HMO | Admitting: Pharmacist

## 2021-12-11 DIAGNOSIS — M9902 Segmental and somatic dysfunction of thoracic region: Secondary | ICD-10-CM | POA: Diagnosis not present

## 2021-12-11 DIAGNOSIS — M9903 Segmental and somatic dysfunction of lumbar region: Secondary | ICD-10-CM | POA: Diagnosis not present

## 2021-12-11 DIAGNOSIS — C50411 Malignant neoplasm of upper-outer quadrant of right female breast: Secondary | ICD-10-CM

## 2021-12-11 DIAGNOSIS — M9901 Segmental and somatic dysfunction of cervical region: Secondary | ICD-10-CM | POA: Diagnosis not present

## 2021-12-11 DIAGNOSIS — R0989 Other specified symptoms and signs involving the circulatory and respiratory systems: Secondary | ICD-10-CM

## 2021-12-11 DIAGNOSIS — M531 Cervicobrachial syndrome: Secondary | ICD-10-CM | POA: Diagnosis not present

## 2021-12-11 NOTE — Progress Notes (Signed)
Chronic Care Management Pharmacy Note  12/11/2021 Name:  [name] MRN:  [id number] DOB:  [birth year]  Summary: LDL not at goal < 100 Pt is not regularly checking BP BP is not ideally at goal < 130/80  Recommendations/Changes made from today's visit: -Recommended repeat lipid panel and consider cholesterol lowering therapy -Recommended routine BP monitoring at home -Requested urine testing from PCP  Plan: Pt will sent over BP readings on mychart Follow up in 4-5 months   Subjective: [name] is an 66 y.o. year old female who is a primary patient of Panosh, Standley Brooking, MD.  The CCM team was consulted for assistance with disease management and care coordination needs.    Engaged with patient by telephone for follow up visit in response to provider referral for pharmacy case management and/or care coordination services.   Consent to Services:  The patient was given information about Chronic Care Management services, agreed to services, and gave verbal consent prior to initiation of services.  Please see initial visit note for detailed documentation.   Patient Care Team: Panosh, Standley Brooking, MD as PCP - General Arvella Nigh, MD as Consulting Physician (Obstetrics and Gynecology) Dr Peggye Form, MD as Consulting Physician (Orthopedic Surgery) Ladene Artist, MD as Consulting Physician (Gastroenterology) Melina Schools, MD as Consulting Physician (Orthopedic Surgery) Rockwell Germany, RN as Oncology Nurse Navigator Mauro Kaufmann, RN as Oncology Nurse Navigator Jovita Kussmaul, MD as Consulting Physician (General Surgery) Truitt Merle, MD as Consulting Physician (Hematology) Eppie Gibson, MD as Attending Physician (Radiation Oncology) Jari Pigg, MD as Consulting Physician (Dermatology) Serita Kyle, MD as Referring Physician (Cardiology) Alla Feeling, NP as Nurse Practitioner (Nurse Practitioner) [name], [facility] as Pharmacist  (Pharmacist)  Recent office visits: 11/07/21 Burnis Medin, MD: Patient presented for tick bite.  Recent consult visits: 12/09/21 Norris Cross, RN (cardiology): Patient presented for PREP class.  11/25/21 [name], PTA (outpatient rehab): Patient presented for PT treatment for aftercare following surgery for neoplasm.  11/06/21 Beverlyn Roux (pain medicine): Patient presented for radiculopathy. Unable to access notes.  10/23/21 Mayra Neer, NP (neurology): Patient presented for OSA follow up. Follow up in 1 year.  10/22/21 Cira Rue, NP (oncology): Patient presented for breast cancer follow up. Prescribed 1/2 tablet of Letrozole and follow up in 8 weeks.  09/24/21 Autumn Messing, MD (surgery): Patient presented for left breast lumpectomy follow up.   09/19/21 Dina Rich, MD (cardiology): Patient presented for HTN follow up. Increased amlodipine to 10 mg daily.  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 No medication changes.  07/16/21 Claris Pong, PT - Patient presented for Aftercare following surgery for neoplasm. No medication changes.   Hospital visits: None in previous 6 months   Objective:  Lab Results  Component Value Date   CREATININE 0.73 10/22/2021   BUN 15 10/22/2021   GFR 86.85 12/30/2018   GFRNONAA >60 10/22/2021   GFRAA 95 01/09/2020   NA 142 10/22/2021   K 3.7 10/22/2021   CALCIUM 9.7 10/22/2021   CO2 28 10/22/2021   GLUCOSE 93 10/22/2021    Lab Results  Component Value Date/Time   HGBA1C 5.2 06/01/2013 04:45 PM   HGBA1C 5.4 12/27/2012 01:55 PM   GFR 86.85 12/30/2018 03:50 PM   GFR 76.89 07/02/2017 03:46 PM    Last diabetic Eye exam: No results found  for: "HMDIABEYEEXA"  Last diabetic Foot exam: No results found for: "HMDIABFOOTEX"   Lab Results  Component Value Date   CHOL 226 (A) 06/05/2020   HDL 57  06/05/2020   LDLCALC 156 06/05/2020   LDLDIRECT 165.2 12/27/2012   TRIG 56 08/05/2019   CHOLHDL 2.9 04/12/2019       Latest Ref Rng & Units 10/22/2021    8:27 AM 07/10/2021   10:28 AM 04/11/2021    1:40 PM  Hepatic Function  Total Protein 6.5 - 8.1 g/dL 7.2  7.1  7.6   Albumin 3.5 - 5.0 g/dL 4.4  4.3  4.2   AST 15 - 41 U/L 32  26  26   ALT 0 - 44 U/L 46  30  33   Alk Phosphatase 38 - 126 U/L 104  101  117   Total Bilirubin 0.3 - 1.2 mg/dL 0.6  0.6  0.5     Lab Results  Component Value Date/Time   TSH 0.674 04/11/2021 01:40 PM   TSH 2.33 06/05/2020 12:00 AM   TSH 3.55 01/09/2020 12:00 AM   TSH 1.51 12/30/2018 03:50 PM       Latest Ref Rng & Units 10/23/2021   12:00 AM 10/22/2021    8:27 AM 07/10/2021   10:28 AM  CBC  WBC  8.3     6.0  5.8   Hemoglobin 12.0 - 16.0 13.9     13.1  13.1   Hematocrit 36 - 46 42     38.9  39.6   Platelets 150 - 400 K/uL 226     186  194      This result is from an external source.    Lab Results  Component Value Date/Time   VD25OH 51.73 10/22/2021 10:02 AM   VD25OH 48.14 04/11/2021 01:40 PM   VD25OH 42.89 12/30/2018 03:50 PM   VD25OH 37.07 08/10/2014 11:10 AM    Clinical ASCVD: No  The 10-year ASCVD risk score (Arnett DK, et al., 2019) is: 12.6%*   Values used to calculate the score:     Age: 7 years     Sex: Female     Is Non-Hispanic African American: No     Diabetic: No     Tobacco smoker: No     Systolic Blood Pressure: 417 mmHg     Is BP treated: Yes     HDL Cholesterol: 57 mg/dL*     Total Cholesterol: 226 mg/dL*     * - Cholesterol units were assumed for this score calculation       05/28/2021   11:26 AM 06/11/2020    1:38 PM 07/30/2016   11:55 AM  Depression screen PHQ 2/9  Decreased Interest 0 0 0  Down, Depressed, Hopeless 0 0 0  PHQ - 2 Score 0 0 0  Altered sleeping  0   Tired, decreased energy  0   Change in appetite  0   Feeling bad or failure about yourself   0   Trouble concentrating  0   Moving slowly or  fidgety/restless  0   Suicidal thoughts  0   PHQ-9 Score  0   Difficult doing work/chores  Not difficult at all        Social History   Tobacco Use  Smoking Status Never  Smokeless Tobacco Never   BP Readings from Last 3 Encounters:  12/03/21 (!) 148/60  10/22/21 129/66  08/29/21 (!) 148/73   Pulse Readings from Last 3 Encounters:  12/03/21 81  10/22/21 74  08/29/21 73   Wt Readings from Last 3 Encounters:  12/03/21 206 lb 12.8 oz (93.8 kg)  11/25/21 208 lb 8 oz (94.6 kg)  10/22/21 210 lb (95.3 kg)   BMI Readings from Last 3 Encounters:  12/03/21 40.39 kg/m  11/25/21 40.72 kg/m  10/22/21 41.01 kg/m    Assessment/Interventions: Review of patient past medical history, allergies, medications, health status, including review of consultants reports, laboratory and other test data, was performed as part of comprehensive evaluation and provision of chronic care management services.   SDOH:  (Social Determinants of Health) assessments and interventions performed: Yes (last 07/24/21)   SDOH Screenings   Alcohol Screen: Low Risk  (06/11/2020)   Alcohol Screen    Last Alcohol Screening Score (AUDIT): 1  Depression (PHQ2-9): Low Risk  (05/28/2021)   Depression (PHQ2-9)    PHQ-2 Score: 0  Financial Resource Strain: Low Risk  (07/24/2021)   Overall Financial Resource Strain (CARDIA)    Difficulty of Paying Living Expenses: Not hard at all  Food Insecurity: No Food Insecurity (05/28/2021)   Hunger Vital Sign    Worried About Running Out of Food in the Last Year: Never true    [place] in the Last Year: Never true  Housing: Low Risk  (05/28/2021)   Housing    Last Housing Risk Score: 0  Physical Activity: Insufficiently Active (05/28/2021)   Exercise Vital Sign    Days of Exercise per Week: 5 days    Minutes of Exercise per Session: 20 min  Social Connections: Socially Integrated (05/28/2021)   Social Connection and Isolation Panel [NHANES]    Frequency of  Communication with Friends and Family: More than three times a week    Frequency of Social Gatherings with Friends and Family: More than three times a week    Attends Religious Services: More than 4 times per year    Active Member of [employer] or Organizations: Yes    Attends Archivist Meetings: More than 4 times per year    Marital Status: Married  Stress: No Stress Concern Present (05/28/2021)   [place]    Feeling of Stress : Not at all  Tobacco Use: Low Risk  (10/22/2021)   Patient History    Smoking Tobacco Use: Never    Smokeless Tobacco Use: Never    Passive Exposure: Not on file  Transportation Needs: No Transportation Needs (07/24/2021)   PRAPARE - Transportation    Lack of Transportation (Medical): No    Lack of Transportation (Non-Medical): No    CCM Care Plan  Allergies  Allergen Reactions   Penicillins Itching, Other (See Comments) and Swelling    REACTION: swelling up as a child REACTION: swelling up as a child   Taxotere [Docetaxel] Anaphylaxis    08/30/20 Constricted throat sensation of throat and tongue swelling and chest pressure Docetaxel paused and given Pepcid 20 mg IV, Benadryl 25 mg IV, Ativan 0.5 mg IV x1.  Ativan 0.5 mg IV x1 was repeated after the restart of the patient's chemotherapy. The patient's symptoms abated and docetaxel restarted.  Then had a recurrence of constriction of throat and was given Ativan 0.5 mg IV x1 for total dose of 1 mg of Ativan.  She was able to complete her treatment with out any further issues or concerns.   Cefdinir Swelling, Itching and Rash    REACTION: Rash, swelling and itching   Norvasc [Amlodipine  Besylate] Other (See Comments)    Fatigue and dizziness   Cetirizine Other (See Comments)    unsure   Irbesartan Other (See Comments)    See 1 20 note  Heart burn , dizziness when taken with diuretic  See 1 20 note  Heart burn , dizziness when taken with  diuretic    Molds & Smuts     Mold and mildew   Pegfilgrastim     Bad reaction- unspecified    Tylenol [Acetaminophen]     Pre fatty liver: does not takes these    Medications Reviewed Today     Reviewed by Alla Feeling, NP (Nurse Practitioner) on 10/22/21 at 1418  Med List Status: <None>   Medication Order Taking? Sig Documenting Provider Last Dose Status Informant  amLODipine (NORVASC) 5 MG tablet [id number] No amlodipine 5 mg tablet  TAKE 1 TABLET BY MOUTH ONCE DAILY [provider] Taking Active   Ascorbic Acid (VITAMIN C) 1000 MG tablet [id number] No Take 1,000 mg by mouth daily. [provider] Taking Active Self  b complex vitamins capsule [id number] No Take 1 capsule by mouth daily. [provider] Taking Active Self  Bioflavonoid Products (QUERCETIN COMPLEX IMMUNE PO) [id number] No Take 1 capsule by mouth as needed. [provider] Taking Active Self  Cholecalciferol (VITAMIN D) 50 MCG (2000 UT) tablet [id number] No Take 4,000 Units by mouth daily. [provider] Taking Active Self  hydrochlorothiazide (HYDRODIURIL) 25 MG tablet [id number] No TAKE 1 TABLET BY MOUTH EVERY DAY  Patient taking differently: Take 12.5 mg by mouth daily.   Panosh, Standley Brooking, MD Taking Active   letrozole [facility]) 2.5 MG tablet [id number] Yes Take 0.5 tablets (1.25 mg total) by mouth daily. Alla Feeling, NP  Active   MAGNESIUM CARBONATE PO [id number] No Take 1 tablet by mouth daily. [provider] Taking Active Self  melatonin 5 MG TABS [id number] No Take 5 mg by mouth at bedtime. [provider] Taking Active Self  Menaquinone-7 (VITAMIN K2) 100 MCG CAPS [id number] No Take 100 mcg by mouth daily. [provider] Taking Active Self  Multiple Vitamin (MULTIVITAMIN) tablet [id number] No Take 1 tablet by mouth daily. [provider] Taking Active Self  NON FORMULARY [id number] No Take 48.6 mg by mouth See admin instructions. Thyroid  desiccated (procine) sr 2 capsules 2 days a week (Sat and Sun), 3 capsules (Mon through Friday) [provider] Taking Active Self  OLIVE LEAF EXTRACT PO [id number] No Take 1 capsule by mouth daily. [provider] Taking Active Self  OVER THE COUNTER MEDICATION [id number] No Take 2 capsules by mouth in the morning and at bedtime. Infla Med [provider] Taking Active Self  OVER THE COUNTER MEDICATION [id number] No Take 1 capsule by mouth daily. Essential Pro [provider] Taking Active Self  potassium chloride SA (KLOR-CON M) 20 MEQ tablet [id number] No TAKE ONE TABLET BY MOUTH ONE TIME DAILY Panosh, Standley Brooking, MD Taking Active   Probiotic Product (PROBIOTIC PO) [id number] No Take 1 capsule by mouth daily. [provider] Taking Active Self  UBIQUINOL PO [id number] No Take by mouth. [provider] Taking Active   ZINC GLUCONATE PO [id number] No Take 10 mg by mouth daily. 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   Hepatic steatosis 11/27/2014   Hx of adenomatous colonic polyps 11/27/2014   Visit for preventive health examination 03/28/2014   Hyperlipidemia 03/28/2014   Left rotator cuff tear 07/07/2013   Tear of left rotator cuff 07/07/2013   Rhinitis 06/01/2013   Chest wall deformity 04/12/2012   Atrial septal aneurysm / if pfo  echo 6 13  10/23/2011   OSA (obstructive sleep apnea) 11/27/2010   Dyslipidemia 11/01/2010   Flushing 08/29/2010   Labile hypertension 08/29/2010   MUSCLE CRAMPS, FOOT 06/10/2010   OTHER SLEEP DISTURBANCES 06/10/2010   ANXIETY, SITUATIONAL 05/08/2010   VERTIGO, POSITIONAL 03/15/2010   VITAMIN D DEFICIENCY 02/18/2010   OBESITY 02/18/2010   ALLERGIC RHINITIS 02/18/2010    PLANTAR FASCIITIS 02/18/2010   TWITCHING 06/11/2009   NUMBNESS, HAND 06/11/2009   CERVICAL STRAIN, ACUTE 06/11/2009   OTHER MALAISE AND FATIGUE 07/09/2007   HYPERTENSION 01/28/2007   Hypothyroidism 11/11/2006   COMMON MIGRAINE 11/11/2006   GERD 11/11/2006   FIBROMYALGIA 11/11/2006    Immunization History  Administered Date(s) Administered   Hep A / Hep B 08/02/2014, 09/20/2014, 06/20/2015   Influenza Split 03/11/2011   Influenza Whole 02/18/2010   Td 01/10/1998   Tdap 03/28/2014   Patient is still having a negative reaction with her estrogen medication. She has a lot of questions about her hormones and wants to see endocrinology. She has yet to hear back about the referral that was placed.  Patient reported her urine is darker than she is used to and wanted to see if there was protein in urine and she wanted to make sure ketones aren't in urine. Patient reports it is just darker in color and she has a prolapsed bladder. Patient wears a pessary and is drinking a lot of water and some tea about 8-10 cups a day.  Patient just started an exercise class for the bulging disc in back and lower neck hurts. She started it this week and then was going to PT to brush up on exercises.  Patient will be going twice a week for 12 weeks. This gets her out and around people. She feels like it's so good to have people around her age there.  Patient reports she doesn't know if this is covered for her insurance but she paid $100 for the 12 weeks.  Patient doesn't eat sugar and doesn't eat flour much. Patient reports wheat and dairy gives her a belly ache. Patient started doing a food diary and this helped. She wants her husband to cook for himself because she is eating really well and he is not eating that food.   Conditions to be addressed/monitored:  Hypertension, Hyperlipidemia, Hypothyroidism, Osteopenia, and Breast cancer  Conditions addressed this visit: Hypertension, osteopenia  There are no  care plans that you recently modified to display for this patient.    Medication Assistance: None required.  Patient affirms current coverage meets needs.  Compliance/Adherence/Medication fill history: Care Gaps: COVID vaccine, shingrix, influenza, Prevnar20 BP- 129/66 10/22/21  Star-Rating Drugs: None  Patient's preferred pharmacy is:  [facility] [place], [place] [address] [place] [address] Phone: [phone] Fax: [phone]  [facility] [place], [place] [address] Phone: [phone] Fax: [phone] [address] [place], [place] [address] [place]  [place] [address] Phone: [phone] Fax: [phone]  Uses pill box? Yes - for thyroid medication and HCTZ; supplements not in pillbox Pt endorses 90% compliance  We discussed: Current pharmacy is preferred with insurance plan and patient is satisfied with pharmacy services Patient decided to: Continue current medication management strategy  Care Plan and Follow Up Patient Decision:  Patient agrees to Care Plan and Follow-up.  Plan: Telephone follow up appointment with care management team member scheduled for:  4-5 months  Jeni Salles, PharmD, [place] [facility] [place] [phone]

## 2021-12-16 NOTE — Progress Notes (Signed)
[facility] PREP Weekly Session  Patient Details  Name: [name] MRN: [id number] Date of Birth: [birth year] Age: 66 y.o. PCP: Burnis Medin, MD  Vitals:   12/16/21 1028  Weight: 205 lb 9.6 oz (93.3 kg)     [facility] Weekly seesion - 12/16/21 1000       [facility] "PREP" Location   [facility] "PREP" Location [facility]      Weekly Session   Topic Discussed Importance of resistance training;Other ways to be active   150 min cardio/wk; strength training 2-3 times/wk, 20-40 minutes   Minutes exercised this week 225 minutes    Classes attended to date [place] 12/16/2021, 10:29 AM

## 2021-12-19 DIAGNOSIS — M545 Low back pain, unspecified: Secondary | ICD-10-CM | POA: Diagnosis not present

## 2021-12-19 DIAGNOSIS — G603 Idiopathic progressive neuropathy: Secondary | ICD-10-CM | POA: Diagnosis not present

## 2021-12-19 DIAGNOSIS — G5603 Carpal tunnel syndrome, bilateral upper limbs: Secondary | ICD-10-CM | POA: Diagnosis not present

## 2021-12-19 DIAGNOSIS — R202 Paresthesia of skin: Secondary | ICD-10-CM | POA: Diagnosis not present

## 2021-12-19 DIAGNOSIS — G3184 Mild cognitive impairment, so stated: Secondary | ICD-10-CM | POA: Diagnosis not present

## 2021-12-23 ENCOUNTER — Inpatient Hospital Stay (HOSPITAL_BASED_OUTPATIENT_CLINIC_OR_DEPARTMENT_OTHER): Payer: Medicare HMO | Admitting: Hematology

## 2021-12-23 ENCOUNTER — Ambulatory Visit: Payer: Medicare HMO | Admitting: Hematology

## 2021-12-23 ENCOUNTER — Encounter: Payer: Self-pay | Admitting: Hematology

## 2021-12-23 ENCOUNTER — Other Ambulatory Visit: Payer: Medicare HMO

## 2021-12-23 ENCOUNTER — Other Ambulatory Visit: Payer: Self-pay

## 2021-12-23 ENCOUNTER — Inpatient Hospital Stay: Payer: Medicare HMO | Attending: Hematology

## 2021-12-23 VITALS — BP 136/67 | HR 81 | Temp 98.8°F | Resp 14 | Ht 60.0 in | Wt 202.0 lb

## 2021-12-23 DIAGNOSIS — Z170 Estrogen receptor positive status [ER+]: Secondary | ICD-10-CM

## 2021-12-23 DIAGNOSIS — Z79811 Long term (current) use of aromatase inhibitors: Secondary | ICD-10-CM | POA: Insufficient documentation

## 2021-12-23 DIAGNOSIS — D0512 Intraductal carcinoma in situ of left breast: Secondary | ICD-10-CM

## 2021-12-23 DIAGNOSIS — K760 Fatty (change of) liver, not elsewhere classified: Secondary | ICD-10-CM | POA: Insufficient documentation

## 2021-12-23 DIAGNOSIS — M8588 Other specified disorders of bone density and structure, other site: Secondary | ICD-10-CM | POA: Insufficient documentation

## 2021-12-23 DIAGNOSIS — R7401 Elevation of levels of liver transaminase levels: Secondary | ICD-10-CM | POA: Insufficient documentation

## 2021-12-23 DIAGNOSIS — Z923 Personal history of irradiation: Secondary | ICD-10-CM | POA: Diagnosis not present

## 2021-12-23 DIAGNOSIS — G629 Polyneuropathy, unspecified: Secondary | ICD-10-CM | POA: Insufficient documentation

## 2021-12-23 DIAGNOSIS — Z85828 Personal history of other malignant neoplasm of skin: Secondary | ICD-10-CM | POA: Insufficient documentation

## 2021-12-23 DIAGNOSIS — Z853 Personal history of malignant neoplasm of breast: Secondary | ICD-10-CM | POA: Diagnosis not present

## 2021-12-23 DIAGNOSIS — I10 Essential (primary) hypertension: Secondary | ICD-10-CM | POA: Insufficient documentation

## 2021-12-23 DIAGNOSIS — C50411 Malignant neoplasm of upper-outer quadrant of right female breast: Secondary | ICD-10-CM

## 2021-12-23 LAB — CBC WITH DIFFERENTIAL (CANCER CENTER ONLY)
Abs Immature Granulocytes: 0.03 10*3/uL (ref 0.00–0.07)
Basophils Absolute: 0 10*3/uL (ref 0.0–0.1)
Basophils Relative: 0 %
Eosinophils Absolute: 0 10*3/uL (ref 0.0–0.5)
Eosinophils Relative: 0 %
HCT: 40.2 % (ref 36.0–46.0)
Hemoglobin: 13.7 g/dL (ref 12.0–15.0)
Immature Granulocytes: 0 %
Lymphocytes Relative: 15 %
Lymphs Abs: 1.3 10*3/uL (ref 0.7–4.0)
MCH: 30.6 pg (ref 26.0–34.0)
MCHC: 34.1 g/dL (ref 30.0–36.0)
MCV: 89.7 fL (ref 80.0–100.0)
Monocytes Absolute: 0.6 10*3/uL (ref 0.1–1.0)
Monocytes Relative: 7 %
Neutro Abs: 6.6 10*3/uL (ref 1.7–7.7)
Neutrophils Relative %: 78 %
Platelet Count: 215 10*3/uL (ref 150–400)
RBC: 4.48 MIL/uL (ref 3.87–5.11)
RDW: 13.4 % (ref 11.5–15.5)
WBC Count: 8.6 10*3/uL (ref 4.0–10.5)
nRBC: 0 % (ref 0.0–0.2)

## 2021-12-23 LAB — CMP (CANCER CENTER ONLY)
ALT: 29 U/L (ref 0–44)
AST: 19 U/L (ref 15–41)
Albumin: 4.7 g/dL (ref 3.5–5.0)
Alkaline Phosphatase: 113 U/L (ref 38–126)
Anion gap: 5 (ref 5–15)
BUN: 20 mg/dL (ref 8–23)
CO2: 30 mmol/L (ref 22–32)
Calcium: 9.8 mg/dL (ref 8.9–10.3)
Chloride: 103 mmol/L (ref 98–111)
Creatinine: 0.77 mg/dL (ref 0.44–1.00)
GFR, Estimated: >60 mL/min (ref >60)
Glucose, Bld: 109 mg/dL — ABNORMAL HIGH (ref 70–99)
Potassium: 3.9 mmol/L (ref 3.5–5.1)
Sodium: 138 mmol/L (ref 135–145)
Total Bilirubin: 0.7 mg/dL (ref 0.3–1.2)
Total Protein: 7.4 g/dL (ref 6.5–8.1)

## 2021-12-23 NOTE — Progress Notes (Signed)
[name]   [phone] Fax:([phone]   Clinic Follow up Note   Patient Care Team: Panosh, Standley Brooking, MD as PCP - General Arvella Nigh, MD as Consulting Physician (Obstetrics and Gynecology) Dr Peggye Form, MD as Consulting Physician (Orthopedic Surgery) Ladene Artist, MD as Consulting Physician (Gastroenterology) Melina Schools, MD as Consulting Physician (Orthopedic Surgery) Rockwell Germany, RN as Oncology Nurse Navigator Mauro Kaufmann, RN as Oncology Nurse Navigator Jovita Kussmaul, MD as Consulting Physician (General Surgery) Truitt Merle, MD as Consulting Physician (Hematology) Eppie Gibson, MD as Attending Physician (Radiation Oncology) Jari Pigg, MD as Consulting Physician (Dermatology) Serita Kyle, MD as Referring Physician (Cardiology) Alla Feeling, NP as Nurse Practitioner (Nurse Practitioner) Viona Gilmore, [facility] as Pharmacist (Pharmacist)  Date of Service:  12/23/2021  CHIEF COMPLAINT: f/u of right breast cancer, left breast DCIS  CURRENT THERAPY:  Exemestane started 02/07/21  ASSESSMENT & PLAN:  [name] is a 66 y.o. post-menopausal female with   1. 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 Her surgical path showed: left breast with no residual DCIS on path; right breast with grade II, 1.5cm invasive ductal carcinoma, with clear margins and 1/2 positive LNs. -Mammaprint showed high risk, she completed adjuvant chemo with TC.  Due to allergy reaction to docetaxel, it was changed to Abraxane from cycle 3. -She received adjuvant radiation under Dr. Isidore Moos 12/03/20 - 01/15/21. -most recent mammogram 06/10/21 was benign. -she started exemestane in late 01/2021 but did not tolerate due to a number of side effects  (arthralgia, fatigue, brain fog, depression, HTN), stopped 08/2021. She was switched to low-dose letrozole 10/22/21. She is tolerating better with far less side effects. I recommend she increase to full dose. If she does not tolerate, I will switch her to tamoxifen as a last effort for antiestrogen therapy. We again reviewed the potential benefit and side effect of antiestrogen therapy, she agreed to continue.  -she reports continued soreness in her breast, likely related to previous surgery and radiation.  Labs reviewed, no concern. Physical exam was unremarkable. There is no clinical concern for recurrence. -she will continue letrozole and surveillance.   2. Bone Health -repeat DEXA 04/10/21 again showed osteopenia, T-score of -2.1 at [facility] spine.   3. Comorbidities: Depression, Fibromyalgia, HLD, HTN, Hypothyroidism, H/o Skin cancer (squamous and basal cell), sleep apnea -she was changed to amlodipine due to worsening HTN from exemestane. -On medications, continue to f/u with her other physicians.    4.  Fatty liver and transaminitis -She has chronic intermittent transaminitis, which she contributes to fatty liver.  She is on low sugar diet and drinks a lot of water -Liver morphology was unremarkable on CT 09/05/20 -She does not drink alcohol, she knows to avoid Tylenol. -LFTs are overall WNL in the last year.     PLAN:  -continue letrozole, increase to full dose 2.$RemoveBef'5mg'UvoBTXDdzs$  dialy.  If she develops significant side effects again, we will switch to tamoxifen. -labs and f/u with NP Lacie in 3 months   No problem-specific Assessment & Plan notes found for this encounter.   SUMMARY OF ONCOLOGIC HISTORY: Oncology History Overview Note  Cancer Staging Ductal carcinoma in situ (DCIS) of left breast Staging form: Breast, AJCC 8th Edition - Clinical stage from 06/21/2020: Stage 0 (  cTis (DCIS), cN0, cM0, G2, ER+, PR+, HER2: Not Assessed) - Signed by Truitt Merle, MD on 06/26/2020 Stage prefix: Initial  diagnosis  Malignant neoplasm of upper-outer quadrant of right breast in female, estrogen receptor positive ([place]) Staging form: Breast, AJCC 8th Edition - Clinical stage from 06/21/2020: Stage IA (cT1b, cN0, cM0, G2, ER+, PR+, HER2-) - Signed by Truitt Merle, MD on 06/26/2020 Stage prefix: Initial diagnosis - Pathologic stage from 07/26/2020: Stage IA (pT1c, pN1a, cM0, G2, ER+, PR+, HER2-) - Signed by Truitt Merle, MD on 08/09/2020 Stage prefix: Initial diagnosis Nuclear grade: G2 Histologic grading system: 3 grade system Residual tumor (R): R0 - None    Malignant neoplasm of upper-outer quadrant of right breast in female, estrogen receptor positive ([place])  06/08/2020 Mammogram   IMPRESSION: 1. 9 x 7 x 6 mm mass in the 12 o'clock position of the right breast, 2cmfn with imaging features highly suspicious for malignancy. 2. 4 mm group of indeterminate calcifications in the 12 o'clock position of the left breast and 4 mm group of indeterminate calcifications in the 1 o'clock position of the left breast. Together, the groups span an area measuring 3.9 cm.   06/21/2020 Cancer Staging   Staging form: Breast, AJCC 8th Edition - Clinical stage from 06/21/2020: Stage IA (cT1b, cN0, cM0, G2, ER+, PR+, HER2-) - Signed by Truitt Merle, MD on 06/26/2020 Stage prefix: Initial diagnosis   06/21/2020 Initial Biopsy   Diagnosis 1. Breast, right, needle core biopsy, 12 oc - INVASIVE MAMMARY CARCINOMA - MAMMARY CARCINOMA IN SITU - SEE COMMENT 2. Breast, left, needle core biopsy, 12 oc - MAMMARY CARCINOMA IN-SITU WITH NECROSIS AND CALCIFICATIONS - SEE COMMENT 3. Breast, left, needle core biopsy, 1 oc - MAMMARY CARCINOMA IN-SITU WITH NECROSIS AND CALCIFICATIONS - SEE COMMENT Microscopic Comment 1. The biopsy material shows an infiltrative proliferation of cells with large vesicular nuclei with inconspicuous nucleoli, arranged linearly and in small clusters. Based on the biopsy, the carcinoma appears Nottingham  grade 2 of 3.  Addendum: 1. E-cadherin is POSITIVE supporting a ductal origin. 2. E-cadherin is POSITIVE supporting a ductal origin.  3. E-cadherin is positive supporting a ductal origin. The focus is less pronounced on the deeper sections and in isolation would likely be considered atypical ductal hyperplasia.   06/21/2020 Receptors her2   1. PROGNOSTIC INDICATORS Results: IMMUNOHISTOCHEMICAL AND MORPHOMETRIC ANALYSIS PERFORMED MANUALLY The tumor cells are EQUIVOCAL for Her2 (2+). HER2 by FISH will be performed and the results reported separately Estrogen Receptor: 95%, POSITIVE, STRONG STAINING INTENSITY Progesterone Receptor: 40%, POSITIVE, MODERATE STAINING INTENSITY Proliferation Marker Ki67: 10%  1. FLUORESCENCE IN-SITU HYBRIDIZATION Results: GROUP 5: HER2 **NEGATIVE** Equivocal form of amplification of the HER2 gene was detected in the IHC 2+ tissue sample received from this individual. HER2 FISH was performed by a technologist and cell imaging and analysis on the [employer].   06/25/2020 Initial Diagnosis   Malignant neoplasm of upper-outer quadrant of right breast in female, estrogen receptor positive ([place])   07/03/2020 Breast MRI   IMPRESSION: 1. Known RIGHT breast cancer, 12 o'clock axis, at anterior depth, measuring 1 cm greatest extent, manifesting as a spiculated enhancing mass on MRI, with associated biopsy clip. Expected post biopsy changes are seen within the adjacent outer RIGHT breast. 2. No evidence of additional multifocal or multicentric disease within the RIGHT breast. 3. Known LEFT breast DCIS within the slightly outer LEFT breast, at anterior depth, corresponding to the biopsy site labeled 1 o'clock axis, with associated enhancement only at the margins  of the biopsy cavity measuring up to 5 mm greatest dimension. 4. Known LEFT breast DCIS within the upper central LEFT breast, at middle depth, corresponding to the biopsy site labeled 12 o'clock axis. Contiguous  linear non-mass enhancement extends 2.3 cm superior-medial to the biopsy cavity, most likely post biopsy change but possibly contiguous extent of disease. 5. No evidence of additional multifocal or multicentric disease within the LEFT breast. 6. No evidence of metastatic lymphadenopathy.     07/26/2020 Cancer Staging   Staging form: Breast, AJCC 8th Edition - Pathologic stage from 07/26/2020: Stage IA (pT1c, pN1a, cM0, G2, ER+, PR+, HER2-) - Signed by Truitt Merle, MD on 08/09/2020 Stage prefix: Initial diagnosis Nuclear grade: G2 Histologic grading system: 3 grade system Residual tumor (R): R0 - None   07/26/2020 Surgery   RIGHT BREAST LUMPECTOMY WITH RADIOACTIVE SEED AND SENTINEL LYMPH NODE BIOPSY by Dr Marlou Starks   07/26/2020 Pathology Results   FINAL MICROSCOPIC DIAGNOSIS:   A. LYMPH NODE, RIGHT AXILLARY #1, SENTINEL, EXCISION:  - Lymph node, negative for carcinoma (0/1)   B. LYMPH NODE, RIGHT AXILLARY, SENTINEL, EXCISION:  - Benign fibroadipose tissue, negative for carcinoma   C. BREAST, RIGHT, LUMPECTOMY:  - Invasive ductal carcinoma, 1.5 cm, grade 2  - Ductal carcinoma in situ, low grade  - Resection margins are negative for carcinoma; closest is the anterior margin of 0.2 cm  - Biopsy site changes  - See oncology table   D. BREAST, LEFT, LUMPECTOMY:  - Benign breast parenchyma with prominent biopsy-related changes  - Negative for residual ductal carcinoma in situ  - See oncology table   E. LYMPH NODE, RIGHT AXILLARY #2, SENTINEL, EXCISION:  - Invasive ductal carcinoma, see comment  COMMENT:  E.  Lymph node tissue is not identified.  Findings likely represent an entirely replaced lymph node with foci of extranodal extension.    07/26/2020 Miscellaneous   Mammaprint High Risk of Luminla Type B 29% risk of recurrence in 10 years if untreated.  Her Mammaprint index is -0.175 She has 94.6% benefit of chemotherapy and hormaonal therapy.      08/20/2020 Imaging   CT  C/A/P IMPRESSION: 1. No definitive imaging findings to suggest metastatic disease in the chest, abdomen or pelvis. 2. Postoperative changes of bilateral lumpectomy and right axillary lymph node dissection with what appears to be a large postoperative seroma in the right axilla, as detailed above. Attention on follow-up studies is recommended to ensure the stability or regression of this collection. 3. Additional incidental findings, as above.   08/28/2020 Imaging   Bone Scan IMPRESSION: No definite scintigraphic evidence of osseous metastases.   08/30/2020 - 11/02/2020 Chemotherapy   Adjuvant Docetaxel and Cytoxan q3weeks for 4 cycles starting 08/30/20. Given skin rash, changes taxol to Abraxane starting with C2 (09/21/20). Completed 11/02/20.    12/03/2020 - 01/15/2021 Radiation Therapy   Bilateral breast radiation and right regional lymph nodes   02/2021 -  Anti-estrogen oral therapy   Adjuvant exemestane   04/11/2021 Survivorship   SCP delivered by Cira Rue, NP   Ductal carcinoma in situ (DCIS) of left breast  06/08/2020 Mammogram   IMPRESSION: 1. 9 x 7 x 6 mm mass in the 12 o'clock position of the right breast, 2cmfn with imaging features highly suspicious for malignancy. 2. 4 mm group of indeterminate calcifications in the 12 o'clock position of the left breast and 4 mm group of indeterminate calcifications in the 1 o'clock position of the left breast. Together, the groups span an  area measuring 3.9 cm.   06/21/2020 Cancer Staging   Staging form: Breast, AJCC 8th Edition - Clinical stage from 06/21/2020: Stage 0 (cTis (DCIS), cN0, cM0, G2, ER+, PR+, HER2: Not Assessed) - Signed by Truitt Merle, MD on 06/26/2020 Stage prefix: Initial diagnosis   06/21/2020 Initial Biopsy   Diagnosis 1. Breast, right, needle core biopsy, 12 oc - INVASIVE MAMMARY CARCINOMA - MAMMARY CARCINOMA IN SITU - SEE COMMENT 2. Breast, left, needle core biopsy, 12 oc - MAMMARY CARCINOMA IN-SITU WITH  NECROSIS AND CALCIFICATIONS - SEE COMMENT 3. Breast, left, needle core biopsy, 1 oc - MAMMARY CARCINOMA IN-SITU WITH NECROSIS AND CALCIFICATIONS - SEE COMMENT Microscopic Comment 1. The biopsy material shows an infiltrative proliferation of cells with large vesicular nuclei with inconspicuous nucleoli, arranged linearly and in small clusters. Based on the biopsy, the carcinoma appears Nottingham grade 2 of 3.  Addendum: 1. E-cadherin is POSITIVE supporting a ductal origin. 2. E-cadherin is POSITIVE supporting a ductal origin.  3. E-cadherin is positive supporting a ductal origin. The focus is less pronounced on the deeper sections and in isolation would likely be considered atypical ductal hyperplasia.   06/21/2020 Receptors her2   2. PROGNOSTIC INDICATORS Results: IMMUNOHISTOCHEMICAL AND MORPHOMETRIC ANALYSIS PERFORMED MANUALLY Estrogen Receptor: 95%, POSITIVE, STRONG STAINING INTENSITY Progesterone Receptor: 30%, POSITIVE, STRONG STAINING INTENSITY   06/25/2020 Initial Diagnosis   Ductal carcinoma in situ (DCIS) of left breast   07/03/2020 Breast MRI   IMPRESSION: 1. Known RIGHT breast cancer, 12 o'clock axis, at anterior depth, measuring 1 cm greatest extent, manifesting as a spiculated enhancing mass on MRI, with associated biopsy clip. Expected post biopsy changes are seen within the adjacent outer RIGHT breast. 2. No evidence of additional multifocal or multicentric disease within the RIGHT breast. 3. Known LEFT breast DCIS within the slightly outer LEFT breast, at anterior depth, corresponding to the biopsy site labeled 1 o'clock axis, with associated enhancement only at the margins of the biopsy cavity measuring up to 5 mm greatest dimension. 4. Known LEFT breast DCIS within the upper central LEFT breast, at middle depth, corresponding to the biopsy site labeled 12 o'clock axis. Contiguous linear non-mass enhancement extends 2.3 cm superior-medial to the biopsy cavity, most  likely post biopsy change but possibly contiguous extent of disease. 5. No evidence of additional multifocal or multicentric disease within the LEFT breast. 6. No evidence of metastatic lymphadenopathy.     07/26/2020 Surgery   LEFT BREAST LUMPECTOMY X 2  WITH RADIOACTIVE SEED LOCALIZATION by Dr Marlou Starks   07/26/2020 Pathology Results   FINAL MICROSCOPIC DIAGNOSIS:   A. LYMPH NODE, RIGHT AXILLARY #1, SENTINEL, EXCISION:  - Lymph node, negative for carcinoma (0/1)   B. LYMPH NODE, RIGHT AXILLARY, SENTINEL, EXCISION:  - Benign fibroadipose tissue, negative for carcinoma   C. BREAST, RIGHT, LUMPECTOMY:  - Invasive ductal carcinoma, 1.5 cm, grade 2  - Ductal carcinoma in situ, low grade  - Resection margins are negative for carcinoma; closest is the anterior margin of 0.2 cm  - Biopsy site changes  - See oncology table   D. BREAST, LEFT, LUMPECTOMY:  - Benign breast parenchyma with prominent biopsy-related changes  - Negative for residual ductal carcinoma in situ  - See oncology table   E. LYMPH NODE, RIGHT AXILLARY #2, SENTINEL, EXCISION:  - Invasive ductal carcinoma, see comment  COMMENT:  E.  Lymph node tissue is not identified.  Findings likely represent an entirely replaced lymph node with foci of extranodal extension.    12/03/2020 -  01/15/2021 Radiation Therapy   Bilateral breast radiation and right regional lymph nodes   02/2021 -  Anti-estrogen oral therapy   Adjuvant exemestane   04/11/2021 Survivorship   SCP delivered by Cira Rue, NP      INTERVAL HISTORY:  [name] is here for a follow up of breast cancer. She was last seen by NP Lacie on 10/22/21. She presents to the clinic alone. 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 She explains she is now doing more exercise, taking vit B12, and received shots from her neurologist. She reports she still has residual soreness to her breast.   All other systems were  reviewed with the patient and are negative.  MEDICAL HISTORY:  Past Medical History:  Diagnosis Date   Anal fissure    Atrial septal aneurysm / if pfo  echo 6 13  10/23/2011   Breast cancer ([place])    Cataract    both eyes   Depression    Fatty liver    "pre fatty liver"   Fibromyalgia    Headache(784.0)    hx of migraines when younger   Heart palpitations    hx with normal holter event monitoring   Hx: UTI (urinary tract infection)    Hyperlipidemia    Hypertension    Hypothyroidism    Obesity    Personal history of chemotherapy    Personal history of radiation therapy    Pneumonia 1972   hx of   Polyp of colon    Serrated adenoma of colon 08/2012   Skin cancer    basal, squamous cell   Sleep apnea    uses cpap    SURGICAL HISTORY: Past Surgical History:  Procedure Laterality Date   BREAST LUMPECTOMY     BREAST LUMPECTOMY WITH RADIOACTIVE SEED AND SENTINEL LYMPH NODE BIOPSY Right 07/26/2020   Procedure: RIGHT BREAST LUMPECTOMY WITH RADIOACTIVE SEED AND SENTINEL LYMPH NODE BIOPSY;  Surgeon: Jovita Kussmaul, MD;  Location: [facility];  Service: General;  Laterality: Right;   BREAST LUMPECTOMY WITH RADIOACTIVE SEED LOCALIZATION Left 07/26/2020   Procedure: LEFT BREAST LUMPECTOMY X 2  WITH RADIOACTIVE SEED LOCALIZATION;  Surgeon: Jovita Kussmaul, MD;  Location: [facility];  Service: General;  Laterality: Left;   COLONOSCOPY     DILATION AND CURETTAGE OF [place] ARTHROSCOPY     both in past   POLYPECTOMY     [place] Left 07/07/2013   Procedure: LEFT SHOULDER MINI OPEN SUBACROMIAL DECOMPRESSION ROTATOR CUFF REPAIR AND POSSIBLE PATCH GRAFT ;  Surgeon: Johnn Hai, MD;  Location: [facility];  Service: Orthopedics;  Laterality: Left;  with interscaline block   SKIN CANCER EXCISION Bilateral    arm, legs, and chest   TONSILLECTOMY      I have reviewed the social history and family history with the patient and  they are unchanged from previous note.  ALLERGIES:  is allergic to penicillins, taxotere [docetaxel], cefdinir, norvasc [amlodipine besylate], cetirizine, irbesartan, molds & smuts, pegfilgrastim, and tylenol [acetaminophen].  MEDICATIONS:  Current Outpatient Medications  Medication Sig Dispense Refill   amLODipine (NORVASC) 10 MG tablet Take 10 mg by mouth daily.     Ascorbic Acid (VITAMIN C) 1000 MG tablet Take 1,000 mg by mouth daily.     b complex vitamins capsule Take 1 capsule by mouth daily.     Bioflavonoid Products (QUERCETIN COMPLEX  IMMUNE PO) Take 1 capsule by mouth as needed.     Cholecalciferol (VITAMIN D) 50 MCG (2000 UT) tablet Take 4,000 Units by mouth daily.     hydrochlorothiazide (HYDRODIURIL) 25 MG tablet Take 12.5 mg by mouth daily.     letrozole (FEMARA) 2.5 MG tablet Take 0.5 tablets (1.25 mg total) by mouth daily. (Patient not taking: Reported on 12/11/2021) 30 tablet 3   MAGNESIUM CARBONATE PO Take 1 tablet by mouth daily.     melatonin 5 MG TABS Take 5 mg by mouth at bedtime.     Menaquinone-7 (VITAMIN K2) 100 MCG CAPS Take 100 mcg by mouth daily.     Multiple Vitamin (MULTIVITAMIN) tablet Take 1 tablet by mouth daily.     NON FORMULARY Take 48.6 mg by mouth See admin instructions. Thyroid desiccated (procine) sr 2 capsules 2 days a week (Sat and Sun), 3 capsules (Mon through Friday)     OLIVE LEAF EXTRACT PO Take 1 capsule by mouth daily.     OVER THE COUNTER MEDICATION Take 2 capsules by mouth in the morning and at bedtime. Infla Med     OVER THE COUNTER MEDICATION Take 1 capsule by mouth daily. Essential Pro     potassium chloride SA (KLOR-CON M) 20 MEQ tablet TAKE ONE TABLET BY MOUTH ONE TIME DAILY 90 tablet 3   Probiotic Product (PROBIOTIC PO) Take 1 capsule by mouth daily.     UBIQUINOL PO Take by mouth.     ZINC GLUCONATE PO Take 10 mg by mouth daily.     No current facility-administered medications for this visit.    PHYSICAL EXAMINATION: ECOG  PERFORMANCE STATUS: 0 - Asymptomatic  Vitals:   12/23/21 1046  BP: 136/67  Pulse: 81  Resp: 14  Temp: 98.8 F (37.1 C)  SpO2: 100%   Wt Readings from Last 3 Encounters:  12/23/21 202 lb 3.2 oz (91.7 kg)  12/23/21 202 lb (91.6 kg)  12/16/21 205 lb 9.6 oz (93.3 kg)     GENERAL:alert, no distress and comfortable SKIN: skin color, texture, turgor are normal, no rashes or significant lesions EYES: normal, Conjunctiva are pink and non-injected, sclera clear  NECK: supple, thyroid normal size, non-tender, without nodularity LYMPH:  no palpable lymphadenopathy in the cervical, axillary LUNGS: clear to auscultation and percussion with normal breathing effort HEART: regular rate & rhythm and no murmurs and no lower extremity edema ABDOMEN:abdomen soft, non-tender and normal bowel sounds Musculoskeletal:no cyanosis of digits and no clubbing  NEURO: alert & oriented x 3 with fluent speech, no focal motor/sensory deficits BREAST: No palpable mass, nodules or adenopathy bilaterally. (+) warmth to right breast  LABORATORY DATA:  I have reviewed the data as listed    Latest Ref Rng & Units 12/23/2021   10:32 AM 10/23/2021   12:00 AM 10/22/2021    8:27 AM  CBC  WBC 4.0 - 10.5 K/uL 8.6  8.3     6.0   Hemoglobin 12.0 - 15.0 g/dL 13.7  13.9     13.1   Hematocrit 36.0 - 46.0 % 40.2  42     38.9   Platelets 150 - 400 K/uL 215  226     186      This result is from an external source.        Latest Ref Rng & Units 12/23/2021   10:32 AM 10/22/2021    8:27 AM 07/10/2021   10:28 AM  CMP  Glucose 70 - 99 mg/dL 109  93  89   BUN 8 - 23 mg/dL $Remove'20  15  23   'uBJyIjf$ Creatinine 0.44 - 1.00 mg/dL 0.77  0.73  0.78   Sodium 135 - 145 mmol/L 138  142  141   Potassium 3.5 - 5.1 mmol/L 3.9  3.7  3.9   Chloride 98 - 111 mmol/L 103  107  105   CO2 22 - 32 mmol/L $RemoveB'30  28  30   'hOMzeAKr$ Calcium 8.9 - 10.3 mg/dL 9.8  9.7  9.7   Total Protein 6.5 - 8.1 g/dL 7.4  7.2  7.1   Total Bilirubin 0.3 - 1.2 mg/dL 0.7  0.6  0.6    Alkaline Phos 38 - 126 U/L 113  104  101   AST 15 - 41 U/L 19  32  26   ALT 0 - 44 U/L 29  46  30       RADIOGRAPHIC STUDIES: I have personally reviewed the radiological images as listed and agreed with the findings in the report. No results found.    No orders of the defined types were placed in this encounter.  All questions were answered. The patient knows to call the clinic with any problems, questions or concerns. No barriers to learning was detected. The total time spent in the appointment was 30 minutes.     Truitt Merle, MD 12/23/2021   I, Wilburn Mylar, am acting as scribe for Truitt Merle, MD.   I have reviewed the above documentation for accuracy and completeness, and I agree with the above.

## 2021-12-23 NOTE — Progress Notes (Signed)
[facility] PREP Weekly Session  Patient Details  Name: [name] MRN: [id number] Date of Birth: 06-Jul-1955 Age: 66 y.o. PCP: Burnis Medin, MD  Vitals:   12/23/21 1126  Weight: 202 lb 3.2 oz (91.7 kg)     [facility] Weekly seesion - 12/23/21 1100       [facility] "PREP" Location   [facility] "PREP" Location [facility]      Weekly Session   Topic Discussed Healthy eating tips   Introduced YUKA app; water: 1/2 body wt in oz. or 64 oz initially   Minutes exercised this week 160 minutes    Classes attended to date [place] 12/23/2021, 11:26 AM

## 2021-12-27 NOTE — Patient Instructions (Signed)
Hi Cindy,  Don't forget to send me some blood pressure readings over the next few weeks!  Please reach out to me if you have any questions or need anything!  Best, Maddie  Jeni Salles, PharmD, [place] Pharmacist [place] [facility]

## 2021-12-30 DIAGNOSIS — D485 Neoplasm of uncertain behavior of skin: Secondary | ICD-10-CM | POA: Diagnosis not present

## 2021-12-30 DIAGNOSIS — C44729 Squamous cell carcinoma of skin of left lower limb, including hip: Secondary | ICD-10-CM | POA: Diagnosis not present

## 2021-12-30 NOTE — Progress Notes (Signed)
[facility] PREP Weekly Session  Patient Details  Name: [name] MRN: [id number] Date of Birth: 03/07/1956 Age: 66 y.o. PCP: Burnis Medin, MD  Vitals:   12/30/21 1043  Weight: 201 lb 3.2 oz (91.3 kg)     [facility] Weekly seesion - 12/30/21 1000       [facility] "PREP" Location   [facility] "PREP" Location [facility]      Weekly Session   Topic Discussed Health habits   Sugar demo; 24 gm for women, 36 gm for men added sugar daily limit   Minutes exercised this week 110 minutes    Classes attended to date [place] 12/30/2021, 10:44 AM

## 2022-01-01 DIAGNOSIS — M531 Cervicobrachial syndrome: Secondary | ICD-10-CM | POA: Diagnosis not present

## 2022-01-01 DIAGNOSIS — M9902 Segmental and somatic dysfunction of thoracic region: Secondary | ICD-10-CM | POA: Diagnosis not present

## 2022-01-01 DIAGNOSIS — M9903 Segmental and somatic dysfunction of lumbar region: Secondary | ICD-10-CM | POA: Diagnosis not present

## 2022-01-01 DIAGNOSIS — M9901 Segmental and somatic dysfunction of cervical region: Secondary | ICD-10-CM | POA: Diagnosis not present

## 2022-01-02 ENCOUNTER — Other Ambulatory Visit: Payer: Self-pay

## 2022-01-02 DIAGNOSIS — I10 Essential (primary) hypertension: Secondary | ICD-10-CM | POA: Diagnosis not present

## 2022-01-02 DIAGNOSIS — R5383 Other fatigue: Secondary | ICD-10-CM | POA: Diagnosis not present

## 2022-01-02 DIAGNOSIS — R3989 Other symptoms and signs involving the genitourinary system: Secondary | ICD-10-CM

## 2022-01-02 DIAGNOSIS — R002 Palpitations: Secondary | ICD-10-CM | POA: Diagnosis not present

## 2022-01-02 DIAGNOSIS — E049 Nontoxic goiter, unspecified: Secondary | ICD-10-CM | POA: Diagnosis not present

## 2022-01-02 DIAGNOSIS — E039 Hypothyroidism, unspecified: Secondary | ICD-10-CM | POA: Diagnosis not present

## 2022-01-02 DIAGNOSIS — M609 Myositis, unspecified: Secondary | ICD-10-CM | POA: Diagnosis not present

## 2022-01-02 DIAGNOSIS — C50919 Malignant neoplasm of unspecified site of unspecified female breast: Secondary | ICD-10-CM | POA: Diagnosis not present

## 2022-01-02 DIAGNOSIS — R5381 Other malaise: Secondary | ICD-10-CM | POA: Diagnosis not present

## 2022-01-02 DIAGNOSIS — K760 Fatty (change of) liver, not elsewhere classified: Secondary | ICD-10-CM | POA: Diagnosis not present

## 2022-01-02 DIAGNOSIS — N951 Menopausal and female climacteric states: Secondary | ICD-10-CM | POA: Diagnosis not present

## 2022-01-02 NOTE — Progress Notes (Signed)
Attempted to reach patient regarding to urinalysis order request by Dr. Regis Bill from [facility] charts. Left a voicemail to call back.

## 2022-01-02 NOTE — Progress Notes (Signed)
Spoke to patient. Schedule lab appointment for patient to drop off urine sample on Monday 8/28.  Lab order is placed.

## 2022-01-06 ENCOUNTER — Other Ambulatory Visit: Payer: Medicare HMO

## 2022-01-06 DIAGNOSIS — D485 Neoplasm of uncertain behavior of skin: Secondary | ICD-10-CM | POA: Diagnosis not present

## 2022-01-06 DIAGNOSIS — R3989 Other symptoms and signs involving the genitourinary system: Secondary | ICD-10-CM

## 2022-01-06 DIAGNOSIS — C44729 Squamous cell carcinoma of skin of left lower limb, including hip: Secondary | ICD-10-CM | POA: Diagnosis not present

## 2022-01-06 LAB — URINALYSIS
Bilirubin Urine: NEGATIVE
Hgb urine dipstick: NEGATIVE
Ketones, ur: NEGATIVE
Leukocytes,Ua: NEGATIVE
Nitrite: NEGATIVE
Specific Gravity, Urine: 1.01 (ref 1.000–1.030)
Total Protein, Urine: NEGATIVE
Urine Glucose: NEGATIVE
Urobilinogen, UA: 0.2 (ref 0.0–1.0)
pH: 7.5 (ref 5.0–8.0)

## 2022-01-06 NOTE — Progress Notes (Signed)
[facility] PREP Weekly Session  Patient Details  Name: [name] MRN: [id number] Date of Birth: [birth year] Age: 66 y.o. PCP: Burnis Medin, MD  Vitals:   01/06/22 1006  Weight: 203 lb (92.1 kg)     [facility] Weekly seesion - 01/06/22 1000       [facility] "PREP" Location   [facility] "PREP" Location [facility]      Weekly Session   Topic Discussed Restaurant Eating   Na intake 1500-'2300mg'$ /day; salt demo   Minutes exercised this week 170 minutes    Classes attended to date [place] 01/06/2022, 10:08 AM

## 2022-01-07 ENCOUNTER — Encounter: Payer: Self-pay | Admitting: Nurse Practitioner

## 2022-01-10 ENCOUNTER — Ambulatory Visit: Payer: Medicare HMO | Admitting: Adult Health

## 2022-01-10 ENCOUNTER — Other Ambulatory Visit: Payer: Medicare HMO

## 2022-01-10 ENCOUNTER — Other Ambulatory Visit: Payer: Self-pay | Admitting: Internal Medicine

## 2022-01-10 DIAGNOSIS — Z1231 Encounter for screening mammogram for malignant neoplasm of breast: Secondary | ICD-10-CM

## 2022-01-14 NOTE — Progress Notes (Signed)
Normal urinalyis

## 2022-01-15 NOTE — Progress Notes (Signed)
[facility] PREP Weekly Session  Patient Details  Name: [name] MRN: [id number] Date of Birth: 1955/08/27 Age: 66 y.o. PCP: Burnis Medin, MD  Vitals:   01/15/22 1100  Weight: 200 lb 3.2 oz (90.8 kg)     [facility] Weekly seesion - 01/15/22 1100       [facility] "PREP" Location   [facility] "PREP" Location [facility]      Weekly Session   Topic Discussed Stress management and problem solving   fingertip mudra meditation; importance of sleep; introduced CALM app and InsightTimer app   Minutes exercised this week 120 minutes    Classes attended to date [place] 01/15/2022, 11:00 AM

## 2022-01-20 ENCOUNTER — Encounter: Payer: Self-pay | Admitting: *Deleted

## 2022-01-20 NOTE — Progress Notes (Signed)
[facility] PREP Weekly Session  Patient Details  Name: [name] MRN: [id number] Date of Birth: 08-10-1955 Age: 66 y.o. PCP: Burnis Medin, MD  Vitals:   01/20/22 1027  Weight: 200 lb 6.4 oz (90.9 kg)     [facility] Weekly seesion - 01/20/22 1000       [facility] "PREP" Location   [facility] "PREP" Location [facility]      Weekly Session   Topic Discussed Expectations and non-scale victories   Restating goals. Focusing on realistic expectations and managing challenges.   Minutes exercised this week 165 minutes    Classes attended to date [place], [place] 01/20/2022, 10:37 AM

## 2022-01-22 DIAGNOSIS — M531 Cervicobrachial syndrome: Secondary | ICD-10-CM | POA: Diagnosis not present

## 2022-01-22 DIAGNOSIS — M9903 Segmental and somatic dysfunction of lumbar region: Secondary | ICD-10-CM | POA: Diagnosis not present

## 2022-01-22 DIAGNOSIS — M9901 Segmental and somatic dysfunction of cervical region: Secondary | ICD-10-CM | POA: Diagnosis not present

## 2022-01-22 DIAGNOSIS — M9902 Segmental and somatic dysfunction of thoracic region: Secondary | ICD-10-CM | POA: Diagnosis not present

## 2022-01-23 DIAGNOSIS — G3184 Mild cognitive impairment, so stated: Secondary | ICD-10-CM | POA: Diagnosis not present

## 2022-01-23 DIAGNOSIS — G603 Idiopathic progressive neuropathy: Secondary | ICD-10-CM | POA: Diagnosis not present

## 2022-01-23 DIAGNOSIS — M545 Low back pain, unspecified: Secondary | ICD-10-CM | POA: Diagnosis not present

## 2022-01-23 DIAGNOSIS — M5417 Radiculopathy, lumbosacral region: Secondary | ICD-10-CM | POA: Diagnosis not present

## 2022-01-24 ENCOUNTER — Telehealth: Payer: Self-pay | Admitting: Internal Medicine

## 2022-01-24 ENCOUNTER — Telehealth: Payer: Self-pay | Admitting: Hematology

## 2022-01-24 DIAGNOSIS — R051 Acute cough: Secondary | ICD-10-CM | POA: Diagnosis not present

## 2022-01-24 DIAGNOSIS — J209 Acute bronchitis, unspecified: Secondary | ICD-10-CM | POA: Diagnosis not present

## 2022-01-24 NOTE — Telephone Encounter (Signed)
Patient called to tell me she wanted to leave a message for her nurse with some symptoms she has been having, sent message to Dr. Ernestina Penna nurse

## 2022-01-24 NOTE — Telephone Encounter (Signed)
Spoke to patient. She is aware we have no available slot to schedule her in to be seen today. Inform that triage nurse advise for her to go to urgent care. Agreed with triage nurse. Patient states she will walk in an urgent care nearby today.

## 2022-01-24 NOTE — Telephone Encounter (Signed)
Patient states: -She was calling to see if she could get scheduled with Dr. Yong Channel due to PCP not being available until 09/25 - She has been having a dry cough that has caused shortness of breath - It feels as though her throat is closing up  - She is concerned especially since she has cancer  Due to this, patient has been transferred to home office-[place] [facility] for triage.

## 2022-01-27 ENCOUNTER — Encounter: Payer: Self-pay | Admitting: *Deleted

## 2022-01-27 NOTE — Progress Notes (Signed)
[facility] PREP Weekly Session  Patient Details  Name: [name] MRN: [id number] Date of Birth: 1956-01-28 Age: 66 y.o. PCP: Burnis Medin, MD  Vitals:   01/27/22 1002  Weight: 200 lb 12.8 oz (91.1 kg)     [facility] Weekly seesion - 01/27/22 1000       [facility] "PREP" Location   [facility] "PREP" Location [facility]      Weekly Session   Topic Discussed Other   Portion control, deceptive food label review, portion size visual aid suitcase.   Minutes exercised this week 130 minutes    Classes attended to date [place], [place] 01/27/2022, 10:07 AM

## 2022-01-28 ENCOUNTER — Telehealth: Payer: Self-pay | Admitting: Hematology

## 2022-01-28 ENCOUNTER — Encounter: Payer: Self-pay | Admitting: Physician Assistant

## 2022-01-28 NOTE — Telephone Encounter (Signed)
Called patient to follow up to make sure we didn't still need to see her in our [facility], she advised all is well she is on some medication and will still see her PCP as scheduled on next Monday and no follow up is needed at this time with our clinic, I did forward message to Dr. Burr Medico.

## 2022-01-30 DIAGNOSIS — H35033 Hypertensive retinopathy, bilateral: Secondary | ICD-10-CM | POA: Diagnosis not present

## 2022-02-02 NOTE — Progress Notes (Signed)
Chief Complaint  Patient presents with   Cough    Pt reports she had went to urgent care in [facility] on 9/15 for cough. Given zpak. Completed. The treatments help. Still feeling tightness and clogged in the throat. Clear to white phlegms   Muscle Pain    Muscle cramp   Medication Reaction    Pt reports she thinks she has reaction to Amlodipine '10mg'$ . Causing her to feel dizzy sometimes    HPI: [name] 66 y.o. come in for  on going cough off and on   months possibly July 2023 Throat felt like closing up and  on cpap and was high pressure .  [facility]   and [facility] and   advised increase cleaning of cpap machinr and now still throat clearing.   Shots in back of neck for tingling .  Sees  thyroid specialist and adrenals  Is having constant throat clearing kind of cough but no fever dyspnea active wheezing. Amlodipine has been very helpful for her blood pressure but feels dizziness soon after taking.  Consider other options early takes HCTZ. ROS: See pertinent positives and negatives per HPI.  Past Medical History:  Diagnosis Date   Anal fissure    Atrial septal aneurysm / if pfo  echo 6 13  10/23/2011   Breast cancer ([place])    Cataract    both eyes   Depression    Fatty liver    "pre fatty liver"   Fibromyalgia    Headache(784.0)    hx of migraines when younger   Heart palpitations    hx with normal holter event monitoring   Hx: UTI (urinary tract infection)    Hyperlipidemia    Hypertension    Hypothyroidism    Obesity    Personal history of chemotherapy    Personal history of radiation therapy    Pneumonia 1972   hx of   Polyp of colon    Serrated adenoma of colon 08/2012   Skin cancer    basal, squamous cell   Sleep apnea    uses cpap    Family History  Problem Relation Age of Onset   Hypertension Mother        low borderline   Osteoporosis Mother    Hypertension Father    Liver disease Father        amyloid deceased   Hyperlipidemia  Father    Melanoma Sister    Cancer Sister        melanoma    Juvenile Diabetes Daughter    ADD / ADHD Child    Hyperlipidemia Other        Maternal grandmother   Colon cancer Neg Hx    Stomach cancer Neg Hx    Colon polyps Neg Hx    Esophageal cancer Neg Hx    Rectal cancer Neg Hx     Social History   Socioeconomic History   Marital status: Married    Spouse name: Not on file   Number of children: 6   Years of education: Not on file   Highest education level: Not on file  Occupational History   Occupation: TEACHER ASST    Employer: [employer]  Tobacco Use   Smoking status: Never   Smokeless tobacco: Never  Vaping Use   Vaping Use: Never used  Substance and Sexual Activity   Alcohol use: Yes    Comment: rare   Drug use: No  Sexual activity: Not on file  Other Topics Concern   Not on file  Social History Narrative   Teachers Aide [name]  Not workking since last February . 15 left shoulder surgery    Married   Special educ   HH of 4   3 outside dogs, 8 goats, 40 chickens and quail. [place]   Job stresses   Ex husband passes away   Not working after injur at school  Shoulder neck                Social Determinants of Health   Financial Resource Strain: [place]  (07/24/2021)   Overall Financial Resource Strain (CARDIA)    Difficulty of Paying Living Expenses: Not hard at all  Food Insecurity: No Food Insecurity (05/28/2021)   Hunger Vital Sign    Worried About Running Out of Food in the Last Year: Never true    Ran Out of Food in the Last Year: Never true  Transportation Needs: No Transportation Needs (07/24/2021)   PRAPARE - Hydrologist (Medical): No    Lack of Transportation (Non-Medical): No  Physical Activity: Insufficiently Active (05/28/2021)   Exercise Vital Sign    Days of Exercise per Week: 5 days    Minutes of Exercise per Session: 20 min  Stress: No Stress Concern Present  (05/28/2021)   [place]    Feeling of Stress : Not at all  Social Connections: [place] (05/28/2021)   Social Connection and Isolation Panel [NHANES]    Frequency of Communication with Friends and Family: More than three times a week    Frequency of Social Gatherings with Friends and Family: More than three times a week    Attends Religious Services: More than 4 times per year    Active Member of [employer] or Organizations: Yes    Attends Music therapist: More than 4 times per year    Marital Status: Married    Outpatient Medications Prior to Visit  Medication Sig Dispense Refill   amLODipine (NORVASC) 10 MG tablet Take 10 mg by mouth daily.     Ascorbic Acid (VITAMIN C) 1000 MG tablet Take 1,000 mg by mouth daily.     b complex vitamins capsule Take 1 capsule by mouth daily.     Bioflavonoid Products (QUERCETIN COMPLEX IMMUNE PO) Take 1 capsule by mouth as needed.     Cholecalciferol (VITAMIN D) 50 MCG (2000 UT) tablet Take 4,000 Units by mouth daily.     hydrochlorothiazide (HYDRODIURIL) 25 MG tablet Take 12.5 mg by mouth daily.     MAGNESIUM CARBONATE PO Take 1 tablet by mouth daily.     melatonin 5 MG TABS Take 5 mg by mouth at bedtime.     Menaquinone-7 (VITAMIN K2) 100 MCG CAPS Take 100 mcg by mouth daily.     Multiple Vitamin (MULTIVITAMIN) tablet Take 1 tablet by mouth daily.     NON FORMULARY Take 48.6 mg by mouth See admin instructions. Thyroid desiccated (procine) sr 2 capsules 2 days a week (Sat and Sun), 3 capsules (Mon through Friday)     OVER THE COUNTER MEDICATION Take 2 capsules by mouth in the morning and at bedtime. Infla Med     OVER THE COUNTER MEDICATION Take 1 capsule by mouth daily. Essential Pro     potassium chloride SA (KLOR-CON M) 20 MEQ tablet TAKE ONE TABLET BY  MOUTH ONE TIME DAILY 90 tablet 3   Probiotic Product (PROBIOTIC PO) Take 1 capsule by mouth daily.      UBIQUINOL PO Take by mouth.     ZINC GLUCONATE PO Take 10 mg by mouth daily.     letrozole (FEMARA) 2.5 MG tablet Take 0.5 tablets (1.25 mg total) by mouth daily. (Patient not taking: Reported on 12/11/2021) 30 tablet 3   OLIVE LEAF EXTRACT PO Take 1 capsule by mouth daily.     No facility-administered medications prior to visit.     EXAM:  BP 132/70 (BP Location: Left Arm, Patient Position: Sitting, Cuff Size: Large)   Pulse 75   Temp 98.2 F (36.8 C) (Oral)   Wt 206 lb (93.4 kg)   LMP  (LMP Unknown)   SpO2 97%   BMI 40.23 kg/m   Body mass index is 40.23 kg/m. BP Readings from Last 3 Encounters:  02/03/22 132/70  12/23/21 136/67  12/03/21 (!) 148/60    GENERAL: vitals reviewed and listed above, alert, oriented, appears well hydrated and in no acute distress respiratory distress voice is fairly clear HEENT: atraumatic, conjunctiva  clear, no obvious abnormalities on inspection of external nose and ears TMs no acute findings OP : no lesion edema or exudate  NECK: no obvious masses on inspection palpation no adenopathy LUNGS: clear to auscultation bilaterally, no wheezes, rales or rhonchi, good air movement CV: HRRR, no clubbing cyanosis or  peripheral edema nl cap refill  MS: moves all extremities without noticeable focal  abnormality PSYCH: pleasant and cooperative, no obvious depression or anxiety Lab Results  Component Value Date   WBC 8.6 12/23/2021   HGB 13.7 12/23/2021   HCT 40.2 12/23/2021   PLT 215 12/23/2021   GLUCOSE 109 (H) 12/23/2021   CHOL 226 (A) 06/05/2020   TRIG 56 08/05/2019   HDL 57 06/05/2020   LDLDIRECT 165.2 12/27/2012   LDLCALC 156 06/05/2020   ALT 29 12/23/2021   AST 19 12/23/2021   NA 138 12/23/2021   K 3.9 12/23/2021   CL 103 12/23/2021   CREATININE 0.77 12/23/2021   BUN 20 12/23/2021   CO2 30 12/23/2021   TSH 0.674 04/11/2021   HGBA1C 5.2 06/01/2013   BP Readings from Last 3 Encounters:  02/03/22 132/70  12/23/21 136/67  12/03/21  (!) 148/60    ASSESSMENT AND PLAN:  Discussed the following assessment and plan:  Cough, persistent  Throat clearing - Feels has gravelly voice history of all allergy woodstove at home on CPAP no other obvious airway irritants  Dizziness - Short-lived after 10 mg amlodipine  Malignant neoplasm of upper-outer quadrant of right breast in female, estrogen receptor positive (HCC) - 3 radiation right  Uses continuous positive airway pressure (CPAP) ventilation at home History of breast cancer and radiation right side no recent chest x-ray or imaging.  Although this sounds like upper respiratory airway laryngeal symptoms and could be from silent reflux and/or irritants.  Advised risk benefit of a chest x-ray to "rule out surprises". Patient wants to avoid extra radiation unless the chest x-ray is a compelling reason.  So will avoid today options discussed differential diagnosis discussed fortunately her exam is good today does not sound infectious. Discussed allergy mitigation Okay to take amlodipine 5 mg twice daily or taking the amlodipine closer to the evening but not right before bed to avoid any potential reflux symptoms. -Patient advised to return or notify health care team  if  new concerns arise.  Patient Instructions  Pepcid ac  twice a day for 2 weeks. +  and see if helps.   Silent reflux can be a cause of  atypical cough and throat clearing .  Chest exam is clear today .   Consider  chest x ray . If on going.  Consider environmental  irritants.  Ok to take  amlodipine in evening but not before bed or to split dose  to 5 twice a day   Let us know how doing in 2-4 weeks  or as needed      Mariann Laster K. Kadeshia Kasparian M.D.

## 2022-02-03 ENCOUNTER — Encounter: Payer: Self-pay | Admitting: Internal Medicine

## 2022-02-03 ENCOUNTER — Telehealth: Payer: Self-pay | Admitting: Internal Medicine

## 2022-02-03 ENCOUNTER — Ambulatory Visit (INDEPENDENT_AMBULATORY_CARE_PROVIDER_SITE_OTHER): Payer: Medicare HMO | Admitting: Internal Medicine

## 2022-02-03 VITALS — BP 132/70 | HR 75 | Temp 98.2°F | Wt 206.0 lb

## 2022-02-03 DIAGNOSIS — R0989 Other specified symptoms and signs involving the circulatory and respiratory systems: Secondary | ICD-10-CM

## 2022-02-03 DIAGNOSIS — Z9989 Dependence on other enabling machines and devices: Secondary | ICD-10-CM | POA: Diagnosis not present

## 2022-02-03 DIAGNOSIS — R053 Chronic cough: Secondary | ICD-10-CM | POA: Diagnosis not present

## 2022-02-03 DIAGNOSIS — C50411 Malignant neoplasm of upper-outer quadrant of right female breast: Secondary | ICD-10-CM

## 2022-02-03 DIAGNOSIS — Z170 Estrogen receptor positive status [ER+]: Secondary | ICD-10-CM

## 2022-02-03 DIAGNOSIS — Z853 Personal history of malignant neoplasm of breast: Secondary | ICD-10-CM

## 2022-02-03 DIAGNOSIS — R42 Dizziness and giddiness: Secondary | ICD-10-CM | POA: Diagnosis not present

## 2022-02-03 NOTE — Telephone Encounter (Signed)
Lease order a chest x-ray either [facility] or [facility] what ever she prefers diagnosis persisting cough history of breast cancer.

## 2022-02-03 NOTE — Telephone Encounter (Signed)
Ok  please place order  for dg chest x ray either here or [facility]  dx  persistent cough  hx of breast cancer

## 2022-02-03 NOTE — Telephone Encounter (Signed)
Pt states a chest xray was discussed at her appointment earlier and she declined but has changed her mind.

## 2022-02-03 NOTE — Patient Instructions (Addendum)
Pepcid ac  twice a day for 2 weeks. +  and see if helps.   Silent reflux can be a cause of  atypical cough and throat clearing .  Chest exam is clear today .   Consider  chest x ray . If on going.  Consider environmental  irritants.  Ok to take  amlodipine in evening but not before bed or to split dose  to 5 twice a day   Let us know how doing in 2-4 weeks  or as needed

## 2022-02-04 NOTE — Telephone Encounter (Signed)
X-ray order placed.  Lab appt scheduled for x-ray.

## 2022-02-05 ENCOUNTER — Ambulatory Visit (INDEPENDENT_AMBULATORY_CARE_PROVIDER_SITE_OTHER): Payer: Medicare HMO

## 2022-02-05 ENCOUNTER — Other Ambulatory Visit: Payer: Medicare HMO

## 2022-02-05 DIAGNOSIS — R053 Chronic cough: Secondary | ICD-10-CM | POA: Diagnosis not present

## 2022-02-05 DIAGNOSIS — Z853 Personal history of malignant neoplasm of breast: Secondary | ICD-10-CM

## 2022-02-05 DIAGNOSIS — R059 Cough, unspecified: Secondary | ICD-10-CM | POA: Diagnosis not present

## 2022-02-10 ENCOUNTER — Encounter: Payer: Self-pay | Admitting: *Deleted

## 2022-02-10 NOTE — Progress Notes (Signed)
[facility] PREP Weekly Session  Patient Details  Name: [name] MRN: [id number] Date of Birth: [birth year] Age: 66 y.o. PCP: Burnis Medin, MD  Vitals:   02/10/22 1019  Weight: 202 lb 3.2 oz (91.7 kg)     [facility] Weekly seesion - 02/10/22 1000       [facility] "PREP" Location   [facility] "PREP" Location [facility]      Weekly Session   Topic Discussed Finding support   Accountability partners, identifying ways to stay on track.   Minutes exercised this week 80 minutes    Classes attended to date [place], [place] 02/10/2022, 10:21 AM

## 2022-02-12 DIAGNOSIS — M9901 Segmental and somatic dysfunction of cervical region: Secondary | ICD-10-CM | POA: Diagnosis not present

## 2022-02-12 DIAGNOSIS — M531 Cervicobrachial syndrome: Secondary | ICD-10-CM | POA: Diagnosis not present

## 2022-02-12 DIAGNOSIS — M9902 Segmental and somatic dysfunction of thoracic region: Secondary | ICD-10-CM | POA: Diagnosis not present

## 2022-02-12 DIAGNOSIS — M9903 Segmental and somatic dysfunction of lumbar region: Secondary | ICD-10-CM | POA: Diagnosis not present

## 2022-02-14 DIAGNOSIS — G4733 Obstructive sleep apnea (adult) (pediatric): Secondary | ICD-10-CM | POA: Diagnosis not present

## 2022-02-14 NOTE — Telephone Encounter (Signed)
Chest xray was placed. Patient done the xray. Waiting on result from PCP.   Patient is aware PCP is out of office and be back next week.   Please advise.

## 2022-02-17 ENCOUNTER — Encounter: Payer: Self-pay | Admitting: *Deleted

## 2022-02-17 NOTE — Progress Notes (Signed)
[facility] PREP Weekly Session  Patient Details  Name: [name] MRN: [id number] Date of Birth: 10-Jan-1956 Age: 66 y.o. PCP: Burnis Medin, MD  Vitals:   02/17/22 1029  Weight: 203 lb 6.4 oz (92.3 kg)     [facility] Weekly seesion - 02/17/22 1000       [facility] "PREP" Location   [facility] "PREP" Location [facility]      Weekly Session   Topic Discussed Calorie breakdown   Discuss quality macronutrients, eating healthy on a budget, review.   Minutes exercised this week 120 minutes    Classes attended to date [place], [place] 02/17/2022, 10:30 AM

## 2022-02-22 NOTE — Telephone Encounter (Signed)
Fortunately no finding on  x ray . Reassuring .

## 2022-02-22 NOTE — Progress Notes (Signed)
NO acute findings . Reassuring

## 2022-02-24 ENCOUNTER — Encounter: Payer: Self-pay | Admitting: *Deleted

## 2022-02-24 NOTE — Progress Notes (Signed)
[facility] PREP Weekly Session  Patient Details  Name: [name] MRN: [id number] Date of Birth: 1956/03/05 Age: 66 y.o. PCP: Burnis Medin, MD  Vitals:   02/24/22 1007  Weight: 201 lb 3.2 oz (91.3 kg)     [facility] Weekly seesion - 02/24/22 1000       [facility] "PREP" Location   [facility] "PREP" Location [facility]      Weekly Session   Topic Discussed Hitting roadblocks   Review and questions. [facility] membership talk by Yetta Flock, Final Fitness assessment, How Fit and [employer].   Classes attended to date [place], [place] 02/24/2022, 10:08 AM

## 2022-02-26 ENCOUNTER — Encounter: Payer: Self-pay | Admitting: *Deleted

## 2022-02-26 NOTE — Progress Notes (Signed)
[facility] PREP Evaluation  Patient Details  Name: [name] MRN: [id number] Date of Birth: 18-Dec-1955 Age: 66 y.o. PCP: Burnis Medin, MD  Vitals:   02/26/22 1405  BP: 120/64  Pulse: 74  Resp: 20  SpO2: 98%  Weight: 201 lb 3.2 oz (91.3 kg)     [facility] Eval - 02/26/22 1400       [facility] "PREP" Location   [facility] "PREP" Location [facility]      Referral    Referring Provider [facility]    Reason for referral Hypertension;Obesitity/Overweight    Program Start Date 12/09/21      Measurement   Waist Circumference 38 inches    Hip Circumference 41.25 inches    Body fat 43.5 percent      Mobility and Daily Activities   I find it easy to walk up or down two or more flights of stairs. 1    I have no trouble taking out the trash. 4    I do housework such as vacuuming and dusting on my own without difficulty. 4    I can easily lift a gallon of milk (8lbs). 4    I can easily walk a mile. 2    I have no trouble reaching into high cupboards or reaching down to pick up something from the floor. 4    I do not have trouble doing out-door work such as Armed forces logistics/support/administrative officer, raking leaves, or gardening. 4      Mobility and Daily Activities   I feel younger than my age. 4    I feel independent. 4    I feel energetic. 2    I live an active life.  3    I feel strong. 3    I feel healthy. 3    I feel active as other people my age. 3      How fit and strong are you.   Fit and Strong Total Score 45            Past Medical History:  Diagnosis Date   Anal fissure    Atrial septal aneurysm / if pfo  echo 6 13  10/23/2011   Breast cancer ([place])    Cataract    both eyes   Depression    Fatty liver    "pre fatty liver"   Fibromyalgia    Headache(784.0)    hx of migraines when younger   Heart palpitations    hx with normal holter event monitoring   Hx: UTI (urinary tract infection)    Hyperlipidemia    Hypertension    Hypothyroidism    Obesity    Personal history of  chemotherapy    Personal history of radiation therapy    Pneumonia 1972   hx of   Polyp of colon    Serrated adenoma of colon 08/2012   Skin cancer    basal, squamous cell   Sleep apnea    uses cpap   Past Surgical History:  Procedure Laterality Date   BREAST LUMPECTOMY     BREAST LUMPECTOMY WITH RADIOACTIVE SEED AND SENTINEL LYMPH NODE BIOPSY Right 07/26/2020   Procedure: RIGHT BREAST LUMPECTOMY WITH RADIOACTIVE SEED AND SENTINEL LYMPH NODE BIOPSY;  Surgeon: Jovita Kussmaul, MD;  Location: [facility];  Service: General;  Laterality: Right;   BREAST LUMPECTOMY WITH RADIOACTIVE SEED LOCALIZATION Left 07/26/2020   Procedure: LEFT BREAST LUMPECTOMY X 2  WITH RADIOACTIVE SEED LOCALIZATION;  Surgeon: Jovita Kussmaul, MD;  Location: [facility];  Service: General;  Laterality: Left;   COLONOSCOPY     DILATION AND CURETTAGE OF [place] ARTHROSCOPY     both in past   POLYPECTOMY     SHOULDER OPEN ROTATOR CUFF REPAIR Left 07/07/2013   Procedure: LEFT SHOULDER MINI OPEN SUBACROMIAL DECOMPRESSION ROTATOR CUFF REPAIR AND POSSIBLE PATCH GRAFT ;  Surgeon: Johnn Hai, MD;  Location: [facility];  Service: Orthopedics;  Laterality: Left;  with interscaline block   SKIN CANCER EXCISION Bilateral    arm, legs, and chest   TONSILLECTOMY     Social History   Tobacco Use  Smoking Status Never  Smokeless Tobacco Never    [place] 02/26/2022, 2:10 PM  Completed PREP. Attended 11 out of 11 educational classes and 10 out of 10 exercise classes. Gained 8 points on her  Fit and Strong survey. Weight loss 5.6 lbs and 4.75 inches lost (hip/waist ratio).Measurable improvement on fitness assessment.

## 2022-02-27 NOTE — Progress Notes (Signed)
02/28/2022 KANYLAH MUENCH [id number] 1956-01-12  Referring provider: Burnis Medin, MD Primary GI doctor: Dr. Fuller Plan  ASSESSMENT AND PLAN:   Hemorrhoids small vascular hemorrhoid and internal hemorrhoids Likely secondary to pelvic floor dysfunction and straining -Sitz baths, increase fiber, increase water -Hydrocortisone external cream sent in.  - Due for colonoscopy, will schedule, afterwards can discuss referral to gen surgery if enlarging/not improved with conservative measures or possible banding.   Cystocele with prolapse and constipation Likely component of pelvis floor dysfunction with history and symptoms.  Will treat with miralax/fiber, squatty potty.  Refer to pelvic floor PT Can consider anal manometry.   Hx of adenomatous colonic polyps 01/26/2017 colonoscopy , recall 5 years.  We have discussed the risks of bleeding, infection, perforation, medication reactions, and remote risk of death associated with colonoscopy. All questions were answered and the patient acknowledges these risk and wishes to proceed.  Hepatic steatosis --Continue to work on risk factor modification including diet exercise and control of risk factors including blood sugars. - monitor q 6 months.   Gastroesophageal reflux disease without esophagitis Lifestyle changes discussed, avoid NSAIDS, ETOH Well controlled at this time  Patient Care Team: Burnis Medin, MD as PCP - General Arvella Nigh, MD as Consulting Physician (Obstetrics and Gynecology) Dr Peggye Form, MD as Consulting Physician (Orthopedic Surgery) Ladene Artist, MD as Consulting Physician (Gastroenterology) Melina Schools, MD as Consulting Physician (Orthopedic Surgery) Rockwell Germany, RN as Oncology Nurse Navigator Mauro Kaufmann, RN as Oncology Nurse Navigator Jovita Kussmaul, MD as Consulting Physician (General Surgery) Truitt Merle, MD as Consulting Physician (Hematology) Eppie Gibson, MD as Attending  Physician (Radiation Oncology) Jari Pigg, MD as Consulting Physician (Dermatology) Serita Kyle, MD as Referring Physician (Cardiology) Alla Feeling, NP as Nurse Practitioner (Nurse Practitioner) Viona Gilmore, [facility] as Pharmacist (Pharmacist)  HISTORY OF PRESENT ILLNESS: 66 y.o. female with a past medical history of hypertension, hyperlipidemia, hypothyroidism, CPAP, breast cancer status post radiation chemotherapy hepatic steatosis, personal history of adenomatous polyps and others listed below presents for evaluation of hemorrhoids.   11/27/2014 office visit Dr. Fuller Plan for elevated LFTs suggested monitoring every 6 months, borderline elevated AMA. 01/26/2017 colonoscopy due to personal history of adenomatous polyps prep excellent 3 polyps 5 to 8 mm sigmoid, internal hemorrhoids recall 5 years. 01/2022 09/05/2020 CT angio chest abdomen pelvis for abdominal pain during chemotherapy for her breast cancer after medication concern for aortic dissection showed no pathology, unremarkable  She has had 6 children, then around her anus, looks purple and swollen, was having some itching and burning when first started about a month ago.  No rectal pain other wise, symptoms have resolved and no blood in stool.  She is on metamucil and has pessary, she has to take that out to have BM.  Can have large firm stool then soft BM's afterwards, states this is unchanged for her.  She has started to go to the gym, had to pee every hour and wears a pad, has stress and urge incontinence. She has neuropathy from chemotherapy and some muscle pain.   She  reports that she has never smoked. She has never used smokeless tobacco. She reports current alcohol use. She reports that she does not use drugs.  Current Medications:   Current Outpatient Medications (Endocrine & Metabolic):    ARMOUR THYROID PO,   Current Outpatient Medications (Cardiovascular):    amLODipine (NORVASC) 10 MG tablet, Take 10 mg by mouth  daily.   hydrochlorothiazide (HYDRODIURIL) 25 MG tablet, Take 12.5 mg by mouth daily. (Patient not taking: Reported on 02/28/2022)     Current Outpatient Medications (Other):    Ascorbic Acid (VITAMIN C) 1000 MG tablet, Take 1,000 mg by mouth daily.   b complex vitamins capsule, Take 1 capsule by mouth daily.   Bioflavonoid Products (QUERCETIN COMPLEX IMMUNE PO), Take 1 capsule by mouth as needed.   Cholecalciferol (VITAMIN D) 50 MCG (2000 UT) tablet, Take 4,000 Units by mouth daily.   hydrocortisone (ANUSOL-HC) 2.5 % rectal cream, Place 1 Application rectally 2 (two) times daily.   MAGNESIUM CARBONATE PO, Take 1 tablet by mouth daily.   melatonin 5 MG TABS, Take 5 mg by mouth at bedtime.   Menaquinone-7 (VITAMIN K2) 100 MCG CAPS, Take 100 mcg by mouth daily.   Multiple Vitamin (MULTIVITAMIN) tablet, Take 1 tablet by mouth daily.   OVER THE COUNTER MEDICATION, Take 2 capsules by mouth in the morning and at bedtime. Infla Med   OVER THE COUNTER MEDICATION, Take 1 capsule by mouth daily. Essential Pro   potassium chloride SA (KLOR-CON M) 20 MEQ tablet, TAKE ONE TABLET BY MOUTH ONE TIME DAILY   Probiotic Product (PROBIOTIC PO), Take 1 capsule by mouth daily.   UBIQUINOL PO, Take by mouth.   ZINC GLUCONATE PO, Take 10 mg by mouth daily.   letrozole (FEMARA) 2.5 MG tablet, Take 0.5 tablets (1.25 mg total) by mouth daily. (Patient not taking: Reported on 02/28/2022)   NON FORMULARY, Take 48.6 mg by mouth See admin instructions. Thyroid desiccated (procine) sr 2 capsules 2 days a week (Sat and Sun), 3 capsules (Mon through Friday) (Patient not taking: Reported on 02/28/2022)  Medical History:  Past Medical History:  Diagnosis Date   Anal fissure    Atrial septal aneurysm / if pfo  echo 6 13  10/23/2011   Breast cancer ([place])    Cataract    both eyes   Depression    Fatty liver    "pre fatty liver"   Fibromyalgia    Headache(784.0)    hx of migraines when younger   Heart palpitations     hx with normal holter event monitoring   Hx: UTI (urinary tract infection)    Hyperlipidemia    Hypertension    Hypothyroidism    Obesity    Personal history of chemotherapy    Personal history of radiation therapy    Pneumonia 1972   hx of   Polyp of colon    Serrated adenoma of colon 08/2012   Skin cancer    basal, squamous cell   Sleep apnea    uses cpap   Allergies:  Allergies  Allergen Reactions   Penicillins Itching, Swelling and Other (See Comments)    REACTION: swelling up as an adult-66 yrs old REACTION: swelling up as a child   Taxotere [Docetaxel] Anaphylaxis    08/30/20 Constricted throat sensation of throat and tongue swelling and chest pressure Docetaxel paused and given Pepcid 20 mg IV, Benadryl 25 mg IV, Ativan 0.5 mg IV x1.  Ativan 0.5 mg IV x1 was repeated after the restart of the patient's chemotherapy. The patient's symptoms abated and docetaxel restarted.  Then had a recurrence of constriction of throat and was given Ativan 0.5 mg IV x1 for total dose of 1 mg of Ativan.  She was able to complete her treatment with out any further issues or concerns.   Cefdinir Swelling, Itching and Rash  REACTION: Rash, swelling and itching   Norvasc [Amlodipine Besylate] Other (See Comments)    Fatigue and dizziness   Cetirizine Other (See Comments)    unsure   Femara [Letrozole] Other (See Comments)    Pt reports severe muscle cramp and exhaustion.    Irbesartan Other (See Comments)    See 1 20 note  Heart burn , dizziness when taken with diuretic  See 1 20 note  Heart burn , dizziness when taken with diuretic    Molds & Smuts     Mold and mildew   Pegfilgrastim     Bad reaction- unspecified    Tylenol [Acetaminophen]     Pre fatty liver: does not takes these     Surgical History:  She  has a past surgical history that includes Tonsillectomy; Ectopic pregnancy surgery (1983); Knee arthroscopy; Skin cancer excision (Bilateral); Dilation and curettage of uterus  (1978); Shoulder open rotator cuff repair (Left, 07/07/2013); Colonoscopy; Polypectomy; Breast lumpectomy with radioactive seed localization (Left, 07/26/2020); Breast lumpectomy with radioactive seed and sentinel lymph node biopsy (Right, 07/26/2020); and Breast lumpectomy. Family History:  Her family history includes ADD / ADHD in her child; Cancer in her sister; Hyperlipidemia in her father and another family member; Hypertension in her father and mother; Juvenile Diabetes in her daughter; Liver disease in her father; Melanoma in her sister; Osteoporosis in her mother.  REVIEW OF SYSTEMS  : All other systems reviewed and negative except where noted in the History of Present Illness.  PHYSICAL EXAM: BP 136/70   Pulse 98   Ht '5\' 5"'$  (1.651 m)   Wt 202 lb 2 oz (91.7 kg)   LMP  (LMP Unknown)   BMI 33.64 kg/m  General:   Pleasant, well developed female in no acute distress Head:   Normocephalic and atraumatic. Eyes:  sclerae anicteric,conjunctive pink  Heart:   regular rate and rhythm Pulm:  Clear anteriorly; no wheezing Abdomen:   Soft, Obese AB, Active bowel sounds. No tenderness . , No organomegaly appreciated. Rectal: 4-5 mm small thrombosed/vascular external hemorrhoid, normal rectal tone, + internal hemorrhoids appreciated, no masses, non tender, brown stool, hemoccult Negative Extremities:  Without edema. Msk: Symmetrical without gross deformities. Peripheral pulses intact.  Neurologic:  Alert and  oriented x4;  No focal deficits.  Skin:   Dry and intact without significant lesions or rashes. Psychiatric:  Cooperative. Normal mood and affect.    Vladimir Crofts, PA-C 2:36 PM

## 2022-02-28 ENCOUNTER — Encounter: Payer: Self-pay | Admitting: Physician Assistant

## 2022-02-28 ENCOUNTER — Ambulatory Visit (INDEPENDENT_AMBULATORY_CARE_PROVIDER_SITE_OTHER): Payer: Medicare HMO | Admitting: Physician Assistant

## 2022-02-28 DIAGNOSIS — K219 Gastro-esophageal reflux disease without esophagitis: Secondary | ICD-10-CM

## 2022-02-28 DIAGNOSIS — K760 Fatty (change of) liver, not elsewhere classified: Secondary | ICD-10-CM

## 2022-02-28 DIAGNOSIS — K649 Unspecified hemorrhoids: Secondary | ICD-10-CM

## 2022-02-28 DIAGNOSIS — Z86010 Personal history of colonic polyps: Secondary | ICD-10-CM

## 2022-02-28 DIAGNOSIS — N814 Uterovaginal prolapse, unspecified: Secondary | ICD-10-CM

## 2022-02-28 DIAGNOSIS — Z860101 Personal history of adenomatous and serrated colon polyps: Secondary | ICD-10-CM

## 2022-02-28 MED ORDER — HYDROCORTISONE (PERIANAL) 2.5 % EX CREA: 1.0000 | TOPICAL_CREAM | Freq: Two times a day (BID) | CUTANEOUS | 2 refills | Status: DC

## 2022-02-28 NOTE — Patient Instructions (Addendum)
_______________________________________________________  If you are age 68 or older, your body mass index should be between 23-30. Your Body mass index is 33.64 kg/m. If this is out of the aforementioned range listed, please consider follow up with your Primary Care Provider. ________________________________________________________  The [place] [facility] providers would like to encourage you to use [facility] to communicate with providers for non-urgent requests or questions.  Due to long hold times on the telephone, sending your provider a message by [facility] may be a faster and more efficient way to get a response.  Please allow 48 business hours for a response.  Please remember that this is for non-urgent requests.  _______________________________________________________  [name] have been scheduled for a colonoscopy. Please follow written instructions given to you at your visit today.  Please pick up your prep supplies at the pharmacy within the next 1-3 days. If you use inhalers (even only as needed), please bring them with you on the day of your procedure.  Due to recent changes in healthcare laws, you may see the results of your imaging and laboratory studies on MyChart before your provider has had a chance to review them.  We understand that in some cases there may be results that are confusing or concerning to you. Not all laboratory results come back in the same time frame and the provider may be waiting for multiple results in order to interpret others.  Please give [place] 48 hours in order for your provider to thoroughly review all the results before contacting the office for clarification of your results.   You have been referred to Pelvic Floor therapy.  Someone will contact you regarding an appointment.  We have sent the following medications to your pharmacy for you to pick up at your convenience:  START: Hydrortisone rectal cream twice daily.   Here some information about pelvic floor  dysfunction. This may be contributing to some of your symptoms. Continue fiber.  We could also refer to pelvic floor physical therapy.   Toileting tips to help with your constipation - Drink at least 64-80 ounces of water/liquid per day. - Establish a time to try to move your bowels every day.  For many people, this is after a cup of coffee or after a meal such as breakfast. - Sit all of the way back on the toilet keeping your back fairly straight and while sitting up, try to rest the tops of your forearms on your upper thighs.   - Raising your feet with a step stool/squatty potty can be helpful to improve the angle that allows your stool to pass through the rectum. - Relax the rectum feeling it bulge toward the toilet water.  If you feel your rectum raising toward your body, you are contracting rather than relaxing. - Breathe in and slowly exhale. "Belly breath" by expanding your belly towards your belly button. Keep belly expanded as you gently direct pressure down and back to the anus.  A low pitched GRRR sound can assist with increasing intra-abdominal pressure.  - Repeat 3-4 times. If unsuccessful, contract the pelvic floor to restore normal tone and get off the toilet.  Avoid excessive straining. - To reduce excessive wiping by teaching your anus to normally contract, place hands on outer aspect of knees and resist knee movement outward.  Hold 5-10 second then place hands just inside of knees and resist inward movement of knees.  Hold 5 seconds.  Repeat a few times each way.  Pelvic Floor Dysfunction, Female Pelvic floor dysfunction (  PFD) is a condition that results when the group of muscles and connective tissues that support the organs in the pelvis (pelvic floor muscles) do not work well. These muscles and their connections form a sling that supports the colon and bladder. In women, they also support the uterus. PFD causes pelvic floor muscles to be too weak, too tight, or both. In PFD,  muscle movements are not coordinated. This may cause bowel or bladder problems. It may also cause pain. What are the causes? This condition may be caused by an injury to the pelvic area or by a weakening of pelvic muscles. This often results from pregnancy and childbirth or other types of strain. In many cases, the exact cause is not known. What increases the risk? The following factors may make you more likely to develop this condition: Having chronic bladder tissue inflammation (interstitial cystitis). Being an older person. Being overweight. History of radiation treatment for cancer in the pelvic region. Previous pelvic surgery, such as removal of the uterus (hysterectomy). What are the signs or symptoms? Symptoms of this condition vary and may include: Bladder symptoms, such as: Trouble starting urination and emptying the bladder. Frequent urinary tract infections. Leaking urine when coughing, laughing, or exercising (stress incontinence). Having to pass urine urgently or frequently. Pain when passing urine. Bowel symptoms, such as: Constipation. Urgent or frequent bowel movements. Incomplete bowel movements. Painful bowel movements. Leaking stool or gas. Unexplained genital or rectal pain. Genital or rectal muscle spasms. Low back pain. Other symptoms may include: A heavy, full, or aching feeling in the vagina. A bulge that protrudes into the vagina. Pain during or after sex. How is this diagnosed? This condition may be diagnosed based on: Your symptoms and medical history. A physical exam. During the exam, your health care provider may check your pelvic muscles for tightness, spasm, pain, or weakness. This may include a rectal exam and a pelvic exam. In some cases, you may have diagnostic tests, such as: Electrical muscle function tests. Urine flow testing. X-ray tests of bowel function. Ultrasound of the pelvic organs. How is this treated? Treatment for this condition  depends on the symptoms. Treatment options include: Physical therapy. This may include Kegel exercises to help relax or strengthen the pelvic floor muscles. Biofeedback. This type of therapy provides feedback on how tight your pelvic floor muscles are so that you can learn to control them. Internal or external massage therapy. A treatment that involves electrical stimulation of the pelvic floor muscles to help control pain (transcutaneous electrical nerve stimulation, or TENS). Sound wave therapy (ultrasound) to reduce muscle spasms. Medicines, such as: Muscle relaxants. Bladder control medicines. Surgery to reconstruct or support pelvic floor muscles may be an option if other treatments do not help. Follow these instructions at home: Activity Do your usual activities as told by your health care provider. Ask your health care provider if you should modify any activities. Do pelvic floor strengthening or relaxing exercises at home as told by your physical therapist. Lifestyle Maintain a healthy weight. Eat foods that are high in fiber, such as beans, whole grains, and fresh fruits and vegetables. Limit foods that are high in fat and processed sugars, such as fried or sweet foods. Manage stress with relaxation techniques such as yoga or meditation. General instructions If you have problems with leakage: Use absorbable pads or wear padded underwear. Wash frequently with mild soap. Keep your genital and anal area as clean and dry as possible. Ask your health care provider if  you should try a barrier cream to prevent skin irritation. Take warm baths to relieve pelvic muscle tension or spasms. Take over-the-counter and prescription medicines only as told by your health care provider. Keep all follow-up visits. How is this prevented? The cause of PFD is not always known, but there are a few things you can do to reduce the risk of developing this condition, including: Staying at a healthy  weight. Getting regular exercise. Managing stress. Contact a health care provider if: Your symptoms are not improving with home care. You have signs or symptoms of PFD that get worse at home. You develop new signs or symptoms. You have signs of a urinary tract infection, such as: Fever. Chills. Increased urinary frequency. A burning feeling when urinating. You have not had a bowel movement in 3 days (constipation). Summary Pelvic floor dysfunction results when the muscles and connective tissues in your pelvic floor do not work well. These muscles and their connections form a sling that supports your colon and bladder. In women, they also support the uterus. PFD may be caused by an injury to the pelvic area or by a weakening of pelvic muscles. PFD causes pelvic floor muscles to be too weak, too tight, or a combination of both. Symptoms may vary from person to person. In most cases, PFD can be treated with physical therapies and medicines. Surgery may be an option if other treatments do not help. This information is not intended to replace advice given to you by your health care provider. Make sure you discuss any questions you have with your health care provider. Document Revised: 09/05/2020 Document Reviewed: 09/05/2020 Elsevier Patient Education  2022 [place] you for entrusting me with your care and choosing [facility].  Vicie Mutters, PA-C

## 2022-03-03 ENCOUNTER — Ambulatory Visit: Payer: Medicare HMO | Attending: Nurse Practitioner

## 2022-03-03 VITALS — Wt 201.4 lb

## 2022-03-03 DIAGNOSIS — Z483 Aftercare following surgery for neoplasm: Secondary | ICD-10-CM | POA: Insufficient documentation

## 2022-03-03 NOTE — Therapy (Signed)
OUTPATIENT PHYSICAL THERAPY SOZO SCREENING NOTE   Patient Name: [name] MRN: [id number] DOB:12/17/1955, 66 y.o., female Today's Date: 03/03/2022  PCP: Burnis Medin, MD REFERRING PROVIDER: Burnis Medin, MD   PT End of Session - 03/03/22 1534     Visit Number 3   # unchanged due to screen only   PT Start Time 1531    PT Stop Time 1536    PT Time Calculation (min) 5 min    Activity Tolerance Patient tolerated treatment well    Behavior During Therapy WFL for tasks assessed/performed             Past Medical History:  Diagnosis Date   Anal fissure    Atrial septal aneurysm / if pfo  echo 6 13  10/23/2011   Breast cancer ([place])    Cataract    both eyes   Depression    Fatty liver    "pre fatty liver"   Fibromyalgia    Headache(784.0)    hx of migraines when younger   Heart palpitations    hx with normal holter event monitoring   Hx: UTI (urinary tract infection)    Hyperlipidemia    Hypertension    Hypothyroidism    Obesity    Personal history of chemotherapy    Personal history of radiation therapy    Pneumonia 1972   hx of   Polyp of colon    Serrated adenoma of colon 08/2012   Skin cancer    basal, squamous cell   Sleep apnea    uses cpap   Past Surgical History:  Procedure Laterality Date   BREAST LUMPECTOMY     BREAST LUMPECTOMY WITH RADIOACTIVE SEED AND SENTINEL LYMPH NODE BIOPSY Right 07/26/2020   Procedure: RIGHT BREAST LUMPECTOMY WITH RADIOACTIVE SEED AND SENTINEL LYMPH NODE BIOPSY;  Surgeon: Jovita Kussmaul, MD;  Location: [facility];  Service: General;  Laterality: Right;   BREAST LUMPECTOMY WITH RADIOACTIVE SEED LOCALIZATION Left 07/26/2020   Procedure: LEFT BREAST LUMPECTOMY X 2  WITH RADIOACTIVE SEED LOCALIZATION;  Surgeon: Jovita Kussmaul, MD;  Location: [place] [facility];  Service: General;  Laterality: Left;   COLONOSCOPY     DILATION AND CURETTAGE OF [place] ARTHROSCOPY     both in past    POLYPECTOMY     [place] Left 07/07/2013   Procedure: LEFT SHOULDER MINI OPEN SUBACROMIAL DECOMPRESSION ROTATOR CUFF REPAIR AND POSSIBLE PATCH GRAFT ;  Surgeon: Johnn Hai, MD;  Location: [facility];  Service: Orthopedics;  Laterality: Left;  with interscaline block   SKIN CANCER EXCISION Bilateral    arm, legs, and chest   TONSILLECTOMY     Patient Active Problem List   Diagnosis Date Noted   Malignant neoplasm of upper-outer quadrant of right breast in female, estrogen receptor positive ([place]) 06/25/2020   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   Hepatic steatosis 11/27/2014   Hx of adenomatous colonic polyps 11/27/2014   Visit for preventive health examination 03/28/2014   Hyperlipidemia 03/28/2014   Left rotator cuff tear 07/07/2013   Tear of left rotator cuff 07/07/2013   Rhinitis 06/01/2013   Chest wall deformity 04/12/2012   Atrial septal aneurysm / if pfo  echo 6 13  10/23/2011   OSA (obstructive sleep apnea) 11/27/2010   Dyslipidemia 11/01/2010   Flushing 08/29/2010   Labile hypertension 08/29/2010   MUSCLE CRAMPS, FOOT 06/10/2010  OTHER SLEEP DISTURBANCES 06/10/2010   ANXIETY, SITUATIONAL 05/08/2010   VERTIGO, POSITIONAL 03/15/2010   VITAMIN D DEFICIENCY 02/18/2010   OBESITY 02/18/2010   ALLERGIC RHINITIS 02/18/2010   PLANTAR FASCIITIS 02/18/2010   TWITCHING 06/11/2009   NUMBNESS, HAND 06/11/2009   CERVICAL STRAIN, ACUTE 06/11/2009   OTHER MALAISE AND FATIGUE 07/09/2007   HYPERTENSION 01/28/2007   Hypothyroidism 11/11/2006   COMMON MIGRAINE 11/11/2006   GERD 11/11/2006   FIBROMYALGIA 11/11/2006    REFERRING DIAG: Rt breast cancer  THERAPY DIAG: Aftercare following surgery for neoplasm  PERTINENT HISTORY:   Patient was diagnosed on 05/22/2020 with 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 She underwent a left lumpectomy for DCIS and a right lumpectomy and sentinel node biopsy on 07/26/2020. She had  2 lymph nodes removed with 1 being positive for cancer on the right side. It is ER/PR positive. Left shoulder rotator cuff repair 06/2013. Last radiation on 01/15/21.  Seroma that was drained x 2-3.     PRECAUTIONS: Rt UE lymphedema risk  SUBJECTIVE: Pt returns for her 3 month L-Dex screen.   PAIN: Are you having pain? No  SOZO SCREENING: Patient was assessed today using the SOZO machine to determine the lymphedema index score. This was compared to her baseline score. It was determined that she is within the recommended range when compared to her baseline and no further action is needed at this time. She will return in 3 months for her next SOZO screen.  Plan: Will continue 3 month SOZO screens until at least 07/27/22   L-DEX FLOWSHEETS - 03/03/22 1500       L-DEX LYMPHEDEMA SCREENING   Measurement Type Unilateral    L-DEX MEASUREMENT EXTREMITY Upper Extremity    POSITION  Standing    DOMINANT SIDE Right    At Risk Side Right    BASELINE SCORE (UNILATERAL) 3.5    L-DEX SCORE (UNILATERAL) 5    VALUE CHANGE (UNILAT) 1.5              [name], PTA 03/03/22 3:37 PM

## 2022-03-04 ENCOUNTER — Other Ambulatory Visit: Payer: Self-pay

## 2022-03-04 DIAGNOSIS — Z5189 Encounter for other specified aftercare: Secondary | ICD-10-CM | POA: Diagnosis not present

## 2022-03-04 DIAGNOSIS — C44729 Squamous cell carcinoma of skin of left lower limb, including hip: Secondary | ICD-10-CM | POA: Diagnosis not present

## 2022-03-05 DIAGNOSIS — M531 Cervicobrachial syndrome: Secondary | ICD-10-CM | POA: Diagnosis not present

## 2022-03-05 DIAGNOSIS — M9903 Segmental and somatic dysfunction of lumbar region: Secondary | ICD-10-CM | POA: Diagnosis not present

## 2022-03-05 DIAGNOSIS — M9901 Segmental and somatic dysfunction of cervical region: Secondary | ICD-10-CM | POA: Diagnosis not present

## 2022-03-05 DIAGNOSIS — M9902 Segmental and somatic dysfunction of thoracic region: Secondary | ICD-10-CM | POA: Diagnosis not present

## 2022-03-07 ENCOUNTER — Other Ambulatory Visit: Payer: Self-pay

## 2022-03-10 ENCOUNTER — Encounter: Payer: Self-pay | Admitting: Gastroenterology

## 2022-03-10 ENCOUNTER — Ambulatory Visit (AMBULATORY_SURGERY_CENTER): Payer: Medicare HMO | Admitting: Gastroenterology

## 2022-03-10 VITALS — BP 148/76 | HR 72 | Temp 98.0°F | Resp 18 | Ht 65.0 in | Wt 202.0 lb

## 2022-03-10 DIAGNOSIS — Z86010 Personal history of colonic polyps: Secondary | ICD-10-CM

## 2022-03-10 DIAGNOSIS — D123 Benign neoplasm of transverse colon: Secondary | ICD-10-CM

## 2022-03-10 DIAGNOSIS — D124 Benign neoplasm of descending colon: Secondary | ICD-10-CM | POA: Diagnosis not present

## 2022-03-10 DIAGNOSIS — E039 Hypothyroidism, unspecified: Secondary | ICD-10-CM | POA: Diagnosis not present

## 2022-03-10 DIAGNOSIS — Z09 Encounter for follow-up examination after completed treatment for conditions other than malignant neoplasm: Secondary | ICD-10-CM

## 2022-03-10 DIAGNOSIS — Z860101 Personal history of adenomatous and serrated colon polyps: Secondary | ICD-10-CM

## 2022-03-10 DIAGNOSIS — I10 Essential (primary) hypertension: Secondary | ICD-10-CM | POA: Diagnosis not present

## 2022-03-10 DIAGNOSIS — M797 Fibromyalgia: Secondary | ICD-10-CM | POA: Diagnosis not present

## 2022-03-10 DIAGNOSIS — G4733 Obstructive sleep apnea (adult) (pediatric): Secondary | ICD-10-CM | POA: Diagnosis not present

## 2022-03-10 MED ORDER — SODIUM CHLORIDE 0.9 % IV SOLN: 500.0000 mL | Freq: Once | INTRAVENOUS | Status: DC

## 2022-03-10 NOTE — Patient Instructions (Signed)
Handouts on polyps and hemorrhoids given to patient. Await pathology results. Resume previous diet and continue present medications. Repeat colonoscopy for surveillance will be determined based off of pathology results.   YOU HAD AN ENDOSCOPIC PROCEDURE TODAY AT THE [facility]:   Refer to the procedure report that was given to you for any specific questions about what was found during the examination.  If the procedure report does not answer your questions, please call your gastroenterologist to clarify.  If you requested that your care partner not be given the details of your procedure findings, then the procedure report has been included in a sealed envelope for you to review at your convenience later.  YOU SHOULD EXPECT: Some feelings of bloating in the abdomen. Passage of more gas than usual.  Walking can help get rid of the air that was put into your GI tract during the procedure and reduce the bloating. If you had a lower endoscopy (such as a colonoscopy or flexible sigmoidoscopy) you may notice spotting of blood in your stool or on the toilet paper. If you underwent a bowel prep for your procedure, you may not have a normal bowel movement for a few days.  Please Note:  You might notice some irritation and congestion in your nose or some drainage.  This is from the oxygen used during your procedure.  There is no need for concern and it should clear up in a day or so.  SYMPTOMS TO REPORT IMMEDIATELY:  Following lower endoscopy (colonoscopy or flexible sigmoidoscopy):  Excessive amounts of blood in the stool  Significant tenderness or worsening of abdominal pains  Swelling of the abdomen that is new, acute  Fever of 100F or higher  For urgent or emergent issues, a gastroenterologist can be reached at any hour by calling [phone]. Do not use MyChart messaging for urgent concerns.    DIET:  We do recommend a small meal at first, but then you may proceed to your regular  diet.  Drink plenty of fluids but you should avoid alcoholic beverages for 24 hours.  ACTIVITY:  You should plan to take it easy for the rest of today and you should NOT DRIVE or use heavy machinery until tomorrow (because of the sedation medicines used during the test).    FOLLOW UP: Our staff will call the number listed on your records the next business day following your procedure.  We will call around 7:15- 8:00 am to check on you and address any questions or concerns that you may have regarding the information given to you following your procedure. If we do not reach you, we will leave a message.     If any biopsies were taken you will be contacted by phone or by letter within the next 1-3 weeks.  Please call us at [phone] if you have not heard about the biopsies in 3 weeks.    SIGNATURES/CONFIDENTIALITY: You and/or your care partner have signed paperwork which will be entered into your electronic medical record.  These signatures attest to the fact that that the information above on your After Visit Summary has been reviewed and is understood.  Full responsibility of the confidentiality of this discharge information lies with you and/or your care-partner. 

## 2022-03-10 NOTE — Progress Notes (Signed)
Pt's states no medical or surgical changes since previsit or office visit. VS assessed by D.T 

## 2022-03-10 NOTE — Progress Notes (Signed)
Pt resting comfortably. VSS. Airway intact. SBAR complete to RN. All questions answered.   

## 2022-03-10 NOTE — Progress Notes (Signed)
Pt's BP cuff on pt's right calf. BP cuff repositioned. VSS.

## 2022-03-10 NOTE — Op Note (Signed)
[facility] Patient Name: [name] Procedure Date: 03/10/2022 10:38 AM MRN: [id number] Endoscopist: Ladene Artist , MD, [id number] Age: 66 Referring MD:  Date of Birth: [birth year] Gender: Female Account #: [id number] Procedure:                Colonoscopy Indications:              Surveillance: Personal history of adenomatous                            polyps on last colonoscopy 5 years ago Medicines:                Monitored Anesthesia Care Procedure:                Pre-Anesthesia Assessment:                           - Prior to the procedure, a History and Physical                            was performed, and patient medications and                            allergies were reviewed. The patient's tolerance of                            previous anesthesia was also reviewed. The risks                            and benefits of the procedure and the sedation                            options and risks were discussed with the patient.                            All questions were answered, and informed consent                            was obtained. Prior Anticoagulants: The patient has                            taken no anticoagulant or antiplatelet agents. ASA                            Grade Assessment: III - A patient with severe                            systemic disease. After reviewing the risks and                            benefits, the patient was deemed in satisfactory                            condition to undergo the procedure.  After obtaining informed consent, the colonoscope                            was passed under direct vision. Throughout the                            procedure, the patient's blood pressure, pulse, and                            oxygen saturations were monitored continuously. The                            CF [id number] #[id number] was introduced through the anus                            and advanced  to the the cecum, identified by                            appendiceal orifice and ileocecal valve. The                            ileocecal valve, appendiceal orifice, and rectum                            were photographed. The quality of the bowel                            preparation was adequate. The colonoscopy was                            performed without difficulty. The patient tolerated                            the procedure well. Scope In: 10:50:42 AM Scope Out: 11:07:57 AM Scope Withdrawal Time: 0 hours 12 minutes 48 seconds  Total Procedure Duration: 0 hours 17 minutes 15 seconds  Findings:                 The perianal and digital rectal examinations were                            normal except for small external hemorrhoids and                            skin tags.                           A 7 mm polyp was found in the descending colon. The                            polyp was sessile. The polyp was removed with a                            cold snare. Resection and retrieval were complete.  Two sessile polyps were found in the transverse                            colon. The polyps were 2 to 3 mm in size. These                            polyps were removed with a cold biopsy forceps.                            Resection and retrieval were complete.                           Internal hemorrhoids were found during                            retroflexion. The hemorrhoids were small and Grade                            I (internal hemorrhoids that do not prolapse).                           The exam was otherwise without abnormality on                            direct and retroflexion views. Complications:            No immediate complications. Estimated blood loss:                            None. Estimated Blood Loss:     Estimated blood loss: none. Impression:               - One 7 mm polyp in the descending colon, removed                             with a cold snare. Resected and retrieved.                           - Two 2 to 3 mm polyps in the transverse colon,                            removed with a cold biopsy forceps. Resected and                            retrieved.                           - Small external hemorrhoids and skin tags.                           - Small internal hemorrhoids. Recommendation:           - Repeat colonoscopy after studies are complete for  surveillance based on pathology results.                           - Patient has a contact number available for                            emergencies. The signs and symptoms of potential                            delayed complications were discussed with the                            patient. Return to normal activities tomorrow.                            Written discharge instructions were provided to the                            patient.                           - Resume previous diet.                           - Continue present medications.                           - Await pathology results. Ladene Artist, MD 03/10/2022 11:12:40 AM This report has been signed electronically.

## 2022-03-10 NOTE — Progress Notes (Signed)
Called to room to assist during endoscopic procedure.  Patient ID and intended procedure confirmed with present staff. Received instructions for my participation in the procedure from the performing physician.  

## 2022-03-10 NOTE — Progress Notes (Signed)
See 02/28/2022 H&P, no changes

## 2022-03-10 NOTE — Progress Notes (Signed)
BP cuff moved from pt's right calf to pt's left FA. Right arm restriction due to pt hx. Left FA 130/68 to Right calf 174/68. Will monitor.

## 2022-03-11 ENCOUNTER — Telehealth: Payer: Self-pay

## 2022-03-11 NOTE — Telephone Encounter (Signed)
  Follow up Call-     03/10/2022    9:57 AM  Call back number  Post procedure Call Back phone  # [phone]  Permission to leave phone message Yes     Patient questions:  Do you have a fever, pain , or abdominal swelling? No. Pain Score  0 *  Have you tolerated food without any problems? Yes.    Have you been able to return to your normal activities? Yes.    Do you have any questions about your discharge instructions: Diet   No. Medications  No. Follow up visit  No.  Do you have questions or concerns about your Care? No.  Actions: * If pain score is 4 or above: No action needed, pain <4.

## 2022-03-17 ENCOUNTER — Ambulatory Visit: Payer: Medicare HMO

## 2022-03-18 DIAGNOSIS — H35033 Hypertensive retinopathy, bilateral: Secondary | ICD-10-CM | POA: Diagnosis not present

## 2022-03-18 DIAGNOSIS — H524 Presbyopia: Secondary | ICD-10-CM | POA: Diagnosis not present

## 2022-03-20 ENCOUNTER — Encounter: Payer: Self-pay | Admitting: Gastroenterology

## 2022-03-20 NOTE — Therapy (Signed)
OUTPATIENT PHYSICAL THERAPY FEMALE PELVIC EVALUATION   Patient Name: [name] MRN: [id number] DOB:[birth year], 66 y.o., female Today's Date: 03/24/2022   PT End of Session - 03/24/22 0939     Visit Number 1    Date for PT Re-Evaluation 05/19/22    Authorization Type [employer]    PT Start Time [phone]    PT Stop Time 1018    PT Time Calculation (min) 47 min    Activity Tolerance Patient tolerated treatment well    Behavior During Therapy [facility] for tasks assessed/performed             Past Medical History:  Diagnosis Date   Anal fissure    Atrial septal aneurysm / if pfo  echo 6 13  10/23/2011   Breast cancer ([place])    Cataract    both eyes   Depression    Fatty liver    "pre fatty liver"   Fibromyalgia    Headache(784.0)    hx of migraines when younger   Heart palpitations    hx with normal holter event monitoring   Hx: UTI (urinary tract infection)    Hyperlipidemia    Hypertension    Hypothyroidism    Obesity    Personal history of chemotherapy    Personal history of radiation therapy    Pneumonia 1972   hx of   Polyp of colon    Serrated adenoma of colon 08/2012   Skin cancer    basal, squamous cell   Sleep apnea    uses cpap   Past Surgical History:  Procedure Laterality Date   BREAST LUMPECTOMY     BREAST LUMPECTOMY WITH RADIOACTIVE SEED AND SENTINEL LYMPH NODE BIOPSY Right 07/26/2020   Procedure: RIGHT BREAST LUMPECTOMY WITH RADIOACTIVE SEED AND SENTINEL LYMPH NODE BIOPSY;  Surgeon: Jovita Kussmaul, MD;  Location: [facility];  Service: General;  Laterality: Right;   BREAST LUMPECTOMY WITH RADIOACTIVE SEED LOCALIZATION Left 07/26/2020   Procedure: LEFT BREAST LUMPECTOMY X 2  WITH RADIOACTIVE SEED LOCALIZATION;  Surgeon: Jovita Kussmaul, MD;  Location: [facility];  Service: General;  Laterality: Left;   COLONOSCOPY     DILATION AND CURETTAGE OF [place] ARTHROSCOPY     both in past   POLYPECTOMY      [place] Left 07/07/2013   Procedure: LEFT SHOULDER MINI OPEN SUBACROMIAL DECOMPRESSION ROTATOR CUFF REPAIR AND POSSIBLE PATCH GRAFT ;  Surgeon: Johnn Hai, MD;  Location: [facility];  Service: Orthopedics;  Laterality: Left;  with interscaline block   SKIN CANCER EXCISION Bilateral    arm, legs, and chest   TONSILLECTOMY     Patient Active Problem List   Diagnosis Date Noted   Malignant neoplasm of upper-outer quadrant of right breast in female, estrogen receptor positive ([place]) 06/25/2020   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   Hepatic steatosis 11/27/2014   Hx of adenomatous colonic polyps 11/27/2014   Visit for preventive health examination 03/28/2014   Hyperlipidemia 03/28/2014   Left rotator cuff tear 07/07/2013   Tear of left rotator cuff 07/07/2013   Rhinitis 06/01/2013   Chest wall deformity 04/12/2012   Atrial septal aneurysm / if pfo  echo 6 13  10/23/2011   OSA (obstructive sleep apnea) 11/27/2010   Dyslipidemia 11/01/2010   Flushing 08/29/2010   Labile hypertension 08/29/2010   MUSCLE CRAMPS, FOOT 06/10/2010   OTHER SLEEP  DISTURBANCES 06/10/2010   ANXIETY, SITUATIONAL 05/08/2010   VERTIGO, POSITIONAL 03/15/2010   VITAMIN D DEFICIENCY 02/18/2010   OBESITY 02/18/2010   ALLERGIC RHINITIS 02/18/2010   PLANTAR FASCIITIS 02/18/2010   TWITCHING 06/11/2009   NUMBNESS, HAND 06/11/2009   CERVICAL STRAIN, ACUTE 06/11/2009   OTHER MALAISE AND FATIGUE 07/09/2007   HYPERTENSION 01/28/2007   Hypothyroidism 11/11/2006   COMMON MIGRAINE 11/11/2006   GERD 11/11/2006   FIBROMYALGIA 11/11/2006    PCP: Burnis Medin, MD  REFERRING PROVIDER: Vladimir Crofts, PA-C  REFERRING DIAG: [phone] (ICD-10-CM) - Hemorrhoids, unspecified hemorrhoid type N81.4 (ICD-10-CM) - Cystocele with prolapse  THERAPY DIAG:  Abnormal posture  Muscle weakness (generalized)  Unspecified lack of coordination  Rationale for Evaluation and Treatment:  Rehabilitation  ONSET DATE: 4 years  SUBJECTIVE:                                                                                                                                                                                           03/24/22 SUBJECTIVE STATEMENT: Pt states that when she has to go to the bathroom, she really has to go. She has a drive to [place] and is concerned. She does wear pessary, but has to take it out when she has bowel movement. Patient does states that she has breast pain - she is going to MD tomorrow. 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  Fluid intake: Yes: she limits fluid to some extent to stop urgency; she does drink 11 glasses of water a day and herbal tea    PAIN:  Are you having pain? Yes DDD NPRS scale: 1/10 Pain location:  low back  Pain type: aching Pain description: intermittent   Aggravating factors: not doing exercises Relieving factors: exercises  PRECAUTIONS: Other: previous cancer dx  WEIGHT BEARING RESTRICTIONS: No  FALLS:  Has patient fallen in last 6 months? No  LIVING ENVIRONMENT: Lives with: lives with their family Lives in: House/apartment   OCCUPATION: reitred  PLOF: Independent  PATIENT GOALS: improve urgency/leaking  PERTINENT HISTORY:  Breast cancer dx 05/22/20, fibromyalgia, anal fissure, G6P6 Sexual abuse: No  BOWEL MOVEMENT: Pain with bowel movement: No Type of bowel movement:Frequency 2x/day, has to take pessary out and Strain No Fully empty rectum: Yes: - Leakage: No Pads: No Fiber supplement: Yes: psyllium husk  URINATION: Pain with urination: No Fully empty bladder: Yes: double voids Stream: Strong Urgency: Yes: if she does not go right away she will leak Frequency: every hour and a half - two hours Leakage: Urge to void, Walking to the bathroom, Coughing, Sneezing, and Laughing Pads: Yes: small pad  INTERCOURSE: Pain  with intercourse:  no pain Ability to have vaginal penetration:  Yes:  - Climax: - Marinoff Scale: 0/3  PREGNANCY: Vaginal deliveries 6 Tearing Yes: episiotomy C-section deliveries 0 Currently pregnant No  PROLAPSE: Cystocele pressure   OBJECTIVE:   03/20/22:  COGNITION: Overall cognitive status: Within functional limits for tasks assessed     SENSATION: Light touch: Appears intact other than tingling in bil LE Proprioception: Appears intact   MUSCLE LENGTH:   FUNCTIONAL TESTS:    GAIT:  Comments: WNL  POSTURE: rounded shoulders, forward head, decreased lumbar lordosis, and posterior pelvic tilt    LUMBARAROM/PROM:  A/PROM A/PROM  eval  Flexion   Extension   Right lateral flexion   Left lateral flexion   Right rotation   Left rotation    (Blank rows = not tested)    LOWER EXTREMITY MMT:   PALPATION:   General  abdominal tenderness at midline just inferior to strenum                External Perineal Exam dryness/redness                             Internal Pelvic Floor WNL  Patient confirms identification and approves PT to assess internal pelvic floor and treatment Yes  PELVIC MMT:   MMT eval  Vaginal 1-2/5, varying coordination with notable abdominal bracing and LE contraction; 3 second endurance; 6 repeat contractions with poor motor control  Internal Anal Sphincter   External Anal Sphincter   Puborectalis   Diastasis Recti 1 finger width separation with mild distortion   (Blank rows = not tested)        TONE: low  PROLAPSE: Grade 3 anterior vaginal wall laxity without pessary in  TODAY'S TREATMENT:                                                                                                                              DATE: 03/24/22 EVAL  Neuromuscular re-education: Pelvic floor contraction training: Pelvic floor contraction strengthening with breath coordination Quick flicks 54Y Long holds 5 x 10 seconds Urge suppression flicks Self-care: Urge suppression technique Not performing  strengthening while on toilet    PATIENT EDUCATION:  Education details: see above self-care Person educated: Patient Education method: Explanation, Demonstration, Tactile cues, Verbal cues, and Handouts Education comprehension: verbalized understanding  HOME EXERCISE PROGRAM: Written handout given (medbridge down)  ASSESSMENT:  CLINICAL IMPRESSION: Patient is a 66 y.o. female who was seen today for physical therapy evaluation and treatment for urinary urgency/incontinence. Exam findings notable for abnormal posture, midline abdominal tenderness, pelvic floor weakness and poor motor control 1-2/5, decreased pelvic floor endurance 3 seconds, difficulty with 6 repeat contractions with varying motor control, and grade 3 anterior vaginal wall laxity without wearing pessary. Signs and symptoms are most consistent with pelvic floor weakness and motor control. Initial treatment included pelvic floor contraction training and initial strengthening  program and education on urge suppression technique. She will benefit from skilled PT intervention in order to improve pelvic floor strength and coordination, begin and progress pelvic floor/core functional strengthening program, and decrease urinary urgency/incontinence in order to improve QOL.   OBJECTIVE IMPAIRMENTS: decreased activity tolerance, decreased coordination, decreased endurance, decreased strength, increased fascial restrictions, increased muscle spasms, impaired tone, postural dysfunction, and pain.   ACTIVITY LIMITATIONS: continence  PARTICIPATION LIMITATIONS: community activity  PERSONAL FACTORS: 1-2 comorbidities: Breast cancer dx 05/22/20, fibromyalgia, anal fissure, G6P6  are also affecting patient's functional outcome.   REHAB POTENTIAL: Good  CLINICAL DECISION MAKING: Stable/uncomplicated  EVALUATION COMPLEXITY: Low   GOALS: Goals reviewed with patient? Yes  SHORT TERM GOALS: Target date: 04/21/2022  Pt will be independent  with HEP.   Baseline: Goal status: INITIAL  2.  Pt will be independent with the knack, urge suppression technique, and double voiding in order to improve bladder habits and decrease urinary incontinence.   Baseline:  Goal status: INITIAL  3.  Pt will be able to correctly perform diaphragmatic breathing and appropriate pressure management in order to prevent worsening vaginal wall laxity and improve pelvic floor A/ROM.   Baseline:  Goal status: INITIAL    LONG TERM GOALS: Target date: 05/19/2022   Pt will be independent with advanced HEP.   Baseline:  Goal status: INITIAL  2.  Pt will be independent with the knack, urge suppression technique, and double voiding in order to improve bladder habits and decrease urinary incontinence.   Baseline:  Goal status: INITIAL  3.  Pt will be able to go 2-3 hours in between voids without urgency or incontinence in order to improve QOL and perform all functional activities with less difficulty.   Baseline:  Goal status: INITIAL  4.  Pt will be able to go on long car trips without having to stop frequently to urinate.  Baseline:  Goal status: INITIAL  5. Pt will report no episodes of urinary incontinence in order to improve confidence in community activities and personal hygiene.   Baseline:  Goal status: INITIAL    PLAN:  PT FREQUENCY: 1x/week  PT DURATION: 8 weeks  PLANNED INTERVENTIONS: Therapeutic exercises, Therapeutic activity, Neuromuscular re-education, Balance training, Gait training, Patient/Family education, Self Care, Joint mobilization, Dry Needling, Biofeedback, and Manual therapy  PLAN FOR NEXT SESSION: Progress pelvic floor strengthening to various standing positions; begin core strengthening.    Heather Roberts, PT, DPT11/13/2310:48 AM

## 2022-03-21 ENCOUNTER — Telehealth: Payer: Self-pay | Admitting: Pharmacist

## 2022-03-21 NOTE — Chronic Care Management (AMB) (Signed)
Chronic Care Management Pharmacy Assistant   Name: [name]  MRN: [id number] DOB: Oct 07, 1955  Reason for Encounter: Disease State   Conditions to be addressed/monitored: HTN  Recent office visits:  02/03/22 Frances Medin, MD - Patient presented for Cough Persistent and other concerns. Stopped [place].   Recent consult visits:  03/04/22 [name] (Dermatology) - Patient presented for wound check. No medication changes.   03/03/22 [name], PTA - Patient presented for Aftercare following surgery for neoplasm. No medication changes.   02/28/22 Frances Crofts, PA-C [name]) - Patient presented for Hemorrhoids unspecified hemorrhoid type and other concerns. Prescribed Hydrocortisone.   02/26/22 Frances Cross, RN [facility]) - Patient presented for Prep Final Assessment. No medication changes.   02/24/22 Frances Cross, RN [facility]) - Patient presented for Prep Class. No medication changes.   01/30/22 [name] (Optometry) - Claims encounter for Hypertensive retinopathy. No other visit details available.  01/24/22 [name], [place] encounter for Acute bronchitis and other concerns. No other visit details available.  01/23/22 [name] (Neurology) - Claims encounter for Low back pain and other concerns. No other visit details available.   01/22/22 Manning, [place] encounter for Segmental and somatic dysfunction of cervical region and other concerns. No other visit details available.   01/01/22  Manning, [place] encounter for Segmental and somatic dysfunction of cervical region and other concerns. No other visit details available.   12/30/21 [name], [place] - Claims encounter for Neoplasm of uncertain behavior of skin and other concerns. No other visit details available.   12/23/21 Frances Merle, MD (Oncology) - Patient presented for Malignant neoplasm of upper-outer quadrant of right breast in female estrogen receptor  positive and other concerns.  12/19/21 [name]  - Claims encounter for Idiopathic progressive neuropathy and other concerns. No other visit details available.   Hospital visits:  None in previous 6 months  Medications: Outpatient Encounter Medications as of 03/21/2022  Medication Sig   amLODipine (NORVASC) 10 MG tablet Take 10 mg by mouth daily.   ARMOUR THYROID PO    Ascorbic Acid (VITAMIN C) 1000 MG tablet Take 1,000 mg by mouth daily.   b complex vitamins capsule Take 1 capsule by mouth daily.   Bioflavonoid Products (QUERCETIN COMPLEX IMMUNE PO) Take 1 capsule by mouth as needed.   Cholecalciferol (VITAMIN D) 50 MCG (2000 UT) tablet Take 4,000 Units by mouth daily.   hydrochlorothiazide (HYDRODIURIL) 25 MG tablet Take 12.5 mg by mouth daily. (Patient not taking: Reported on 02/28/2022)   hydrocortisone (ANUSOL-HC) 2.5 % rectal cream Place 1 Application rectally 2 (two) times daily. (Patient not taking: Reported on 03/10/2022)   letrozole (FEMARA) 2.5 MG tablet Take 0.5 tablets (1.25 mg total) by mouth daily. (Patient not taking: Reported on 02/28/2022)   MAGNESIUM CARBONATE PO Take 1 tablet by mouth daily.   melatonin 5 MG TABS Take 5 mg by mouth at bedtime.   Menaquinone-7 (VITAMIN K2) 100 MCG CAPS Take 100 mcg by mouth daily.   Multiple Vitamin (MULTIVITAMIN) tablet Take 1 tablet by mouth daily.   NON FORMULARY Take 48.6 mg by mouth See admin instructions. Thyroid desiccated (procine) sr 2 capsules 2 days a week (Sat and Sun), 3 capsules (Mon through Friday) (Patient not taking: Reported on 02/28/2022)   OVER THE COUNTER MEDICATION Take 2 capsules by mouth in the morning and at bedtime. Infla Med   OVER THE COUNTER MEDICATION Take 1 capsule by mouth daily.  Essential Pro   potassium chloride SA (KLOR-CON M) 20 MEQ tablet TAKE ONE TABLET BY MOUTH ONE TIME DAILY   Probiotic Product (PROBIOTIC PO) Take 1 capsule by mouth daily.   UBIQUINOL PO Take by mouth.   ZINC GLUCONATE PO  Take 10 mg by mouth daily.   No facility-administered encounter medications on file as of 03/21/2022.   Reviewed chart prior to disease state call. Spoke with patient regarding BP  Recent Office Vitals: BP Readings from Last 3 Encounters:  03/10/22 (!) 148/76  02/28/22 136/70  02/26/22 120/64   Pulse Readings from Last 3 Encounters:  03/10/22 72  02/28/22 98  02/26/22 74    Wt Readings from Last 3 Encounters:  03/10/22 202 lb (91.6 kg)  03/03/22 201 lb 6 oz (91.3 kg)  02/28/22 202 lb 2 oz (91.7 kg)     Kidney Function Lab Results  Component Value Date/Time   CREATININE 0.77 12/23/2021 10:32 AM   CREATININE 0.73 10/22/2021 08:27 AM   CREATININE 0.86 02/06/2011 09:37 AM   GFR 86.85 12/30/2018 03:50 PM   GFRNONAA >60 12/23/2021 10:32 AM   GFRNONAA >60 02/06/2011 09:37 AM   GFRAA 95 01/09/2020 12:00 AM   GFRAA >60 02/06/2011 09:37 AM       Latest Ref Rng & Units 12/23/2021   10:32 AM 10/22/2021    8:27 AM 07/10/2021   10:28 AM  BMP  Glucose 70 - 99 mg/dL 109  93  89   BUN 8 - 23 mg/dL '20  15  23   '$ Creatinine 0.44 - 1.00 mg/dL 0.77  0.73  0.78   Sodium 135 - 145 mmol/L 138  142  141   Potassium 3.5 - 5.1 mmol/L 3.9  3.7  3.9   Chloride 98 - 111 mmol/L 103  107  105   CO2 22 - 32 mmol/L '30  28  30   '$ Calcium 8.9 - 10.3 mg/dL 9.8  9.7  9.7     Current antihypertensive regimen:  amLODipine (NORVASC) 10 MG tablet   hydrochlorothiazide (HYDRODIURIL) 25 MG tablet   How often are you checking your Blood Pressure? 1-2x per week Current home BP readings: 120/70 130/80 What recent interventions/DTPs have been made by any provider to improve Blood Pressure control since last CPP Visit: Patient reports she has tried the half  of her blood pressure medication in the morning and the other half at night as recommended to help with dizziness and she reports that it has helped mostly resolve the issue. She denies any hypertensive symptoms.  Any recent hospitalizations or ED visits  since last visit with CPP? No Notes: Patient notes she will be going out of town on this upcoming Wed and driving to [place] with her children. She notes she has had a slow moving infection of sorts with greenish brown firm nasal congestion for the past week, no cough, fever or any other symptoms to report. Patient states shed like a call from the office to see if she may be seen prior to address before her trip. Forwarded information to office so they may call her to schedule.   Adherence Review: Is the patient currently on ACE/ARB medication? No Does the patient have >5 day gap between last estimated fill dates? No     Care Gaps: COVID Booster - Overdue Zoster Vaccine - Overdue PNA Vaccine - Postponed Flu Vaccine - Postponed CCM - 2/24  Star Rating Drugs: None    Ned Clines [place] Clinical Pharmacist Assistant [phone]

## 2022-03-24 ENCOUNTER — Ambulatory Visit: Payer: Medicare HMO | Attending: Physician Assistant

## 2022-03-24 ENCOUNTER — Encounter: Payer: Self-pay | Admitting: Internal Medicine

## 2022-03-24 ENCOUNTER — Telehealth (INDEPENDENT_AMBULATORY_CARE_PROVIDER_SITE_OTHER): Payer: Medicare HMO | Admitting: Internal Medicine

## 2022-03-24 ENCOUNTER — Other Ambulatory Visit: Payer: Self-pay

## 2022-03-24 VITALS — BP 137/74 | HR 86 | Ht 65.5 in | Wt 202.0 lb

## 2022-03-24 DIAGNOSIS — M6281 Muscle weakness (generalized): Secondary | ICD-10-CM | POA: Diagnosis not present

## 2022-03-24 DIAGNOSIS — R293 Abnormal posture: Secondary | ICD-10-CM | POA: Insufficient documentation

## 2022-03-24 DIAGNOSIS — J329 Chronic sinusitis, unspecified: Secondary | ICD-10-CM | POA: Diagnosis not present

## 2022-03-24 DIAGNOSIS — R279 Unspecified lack of coordination: Secondary | ICD-10-CM | POA: Diagnosis not present

## 2022-03-24 DIAGNOSIS — J3489 Other specified disorders of nose and nasal sinuses: Secondary | ICD-10-CM | POA: Diagnosis not present

## 2022-03-24 MED ORDER — DOXYCYCLINE HYCLATE 100 MG PO TABS: 100.0000 mg | ORAL_TABLET | Freq: Two times a day (BID) | ORAL | 0 refills | Status: DC

## 2022-03-24 NOTE — Patient Instructions (Signed)
Pelvic floor muscle strengthening:  Long holds: 5 x 10 seconds Quick flicks: 16I, inhale relax, exhale contract  *Repeat these 15 contractions 3x/day    Urge suppression technique: A technique to help you hold urine until it's an appropriate time to go, whether this is making it home or trying to reach a specific voiding time frame according to your schedule. It helps to send signals from the bladder to the brain that say you don't actually have to void urine right now. This most likely only give you several minutes of relief at first, but repeat as needed; the benefit will last longer as you use this technique more and get into better bladder habits.   The technique: o Perform 5 quick flicks (Kegels) rapidly, not worrying about fully relaxing in between each (only in this technique). o Then perform several deep belly breaths while focusing on relaxing the pelvic floor. o Go do something else to help distract yourself from the urge to urinate. o Repeat as needed.

## 2022-03-24 NOTE — Progress Notes (Signed)
Virtual Visit via Video Note  I connected with [name] on 03/24/22 at  4:00 PM EST by a video enabled telemedicine application and verified that I am speaking with the correct person using two identifiers. Location patient: home Location provider:work office Persons participating in the virtual visit: patient, provider   Patient aware  of the limitations of evaluation and management by telemedicine and  availability of in person appointments. and agreed to proceed.   HPI: [name] presents for video visit for ur congestion sx  asymmetrical right more than left can breath though nose but congestion and having some green brown dc  . No fever sig cough  process going on about 10 days .  To travel road trip driving to [facility] and back (family) Some ear discomfort on r side  . No sig cough   cannot take pcn  ROS: See pertinent positives and negatives per HPI.  Past Medical History:  Diagnosis Date   Anal fissure    Atrial septal aneurysm / if pfo  echo 6 13  10/23/2011   Breast cancer ([place])    Cataract    both eyes   Depression    Fatty liver    "pre fatty liver"   Fibromyalgia    Headache(784.0)    hx of migraines when younger   Heart palpitations    hx with normal holter event monitoring   Hx: UTI (urinary tract infection)    Hyperlipidemia    Hypertension    Hypothyroidism    Obesity    Personal history of chemotherapy    Personal history of radiation therapy    Pneumonia 1972   hx of   Polyp of colon    Serrated adenoma of colon 08/2012   Skin cancer    basal, squamous cell   Sleep apnea    uses cpap    Past Surgical History:  Procedure Laterality Date   BREAST LUMPECTOMY     BREAST LUMPECTOMY WITH RADIOACTIVE SEED AND SENTINEL LYMPH NODE BIOPSY Right 07/26/2020   Procedure: RIGHT BREAST LUMPECTOMY WITH RADIOACTIVE SEED AND SENTINEL LYMPH NODE BIOPSY;  Surgeon: Jovita Kussmaul, MD;  Location: [facility];  Service: General;  Laterality: Right;    BREAST LUMPECTOMY WITH RADIOACTIVE SEED LOCALIZATION Left 07/26/2020   Procedure: LEFT BREAST LUMPECTOMY X 2  WITH RADIOACTIVE SEED LOCALIZATION;  Surgeon: Jovita Kussmaul, MD;  Location: [facility];  Service: General;  Laterality: Left;   COLONOSCOPY     DILATION AND CURETTAGE OF [place] ARTHROSCOPY     both in past   POLYPECTOMY     [place] Left 07/07/2013   Procedure: LEFT SHOULDER MINI OPEN SUBACROMIAL DECOMPRESSION ROTATOR CUFF REPAIR AND POSSIBLE PATCH GRAFT ;  Surgeon: Johnn Hai, MD;  Location: [facility];  Service: Orthopedics;  Laterality: Left;  with interscaline block   SKIN CANCER EXCISION Bilateral    arm, legs, and chest   TONSILLECTOMY      Family History  Problem Relation Age of Onset   Hypertension Mother        low borderline   Osteoporosis Mother    Hypertension Father    Liver disease Father        amyloid deceased   Hyperlipidemia Father    Melanoma Sister    Cancer Sister        melanoma    Juvenile Diabetes Daughter  ADD / ADHD Child    Hyperlipidemia Other        Maternal grandmother   Colon cancer Neg Hx    Stomach cancer Neg Hx    Colon polyps Neg Hx    Esophageal cancer Neg Hx    Rectal cancer Neg Hx     Social History   Tobacco Use   Smoking status: Never   Smokeless tobacco: Never  Vaping Use   Vaping Use: Never used  Substance Use Topics   Alcohol use: Yes    Comment: rare   Drug use: No      Current Outpatient Medications:    amLODipine (NORVASC) 10 MG tablet, Take 10 mg by mouth daily., Disp: , Rfl:    ARMOUR THYROID PO, , Disp: , Rfl:    Ascorbic Acid (VITAMIN C) 1000 MG tablet, Take 1,000 mg by mouth daily., Disp: , Rfl:    b complex vitamins capsule, Take 1 capsule by mouth daily., Disp: , Rfl:    Bioflavonoid Products (QUERCETIN COMPLEX IMMUNE PO), Take 1 capsule by mouth as needed., Disp: , Rfl:    Cholecalciferol (VITAMIN D) 50 MCG (2000 UT) tablet,  Take 4,000 Units by mouth daily., Disp: , Rfl:    doxycycline (VIBRA-TABS) 100 MG tablet, Take 1 tablet (100 mg total) by mouth 2 (two) times daily. If needed for sinusitis, Disp: 14 tablet, Rfl: 0   MAGNESIUM CARBONATE PO, Take 1 tablet by mouth daily., Disp: , Rfl:    melatonin 5 MG TABS, Take 5 mg by mouth at bedtime., Disp: , Rfl:    Menaquinone-7 (VITAMIN K2) 100 MCG CAPS, Take 100 mcg by mouth daily., Disp: , Rfl:    Multiple Vitamin (MULTIVITAMIN) tablet, Take 1 tablet by mouth daily., Disp: , Rfl:    OVER THE COUNTER MEDICATION, Take 2 capsules by mouth in the morning and at bedtime. Infla Med, Disp: , Rfl:    OVER THE COUNTER MEDICATION, Take 1 capsule by mouth daily. Essential Pro, Disp: , Rfl:    potassium chloride SA (KLOR-CON M) 20 MEQ tablet, TAKE ONE TABLET BY MOUTH ONE TIME DAILY, Disp: 90 tablet, Rfl: 3   Probiotic Product (PROBIOTIC PO), Take 1 capsule by mouth daily., Disp: , Rfl:    UBIQUINOL PO, Take by mouth., Disp: , Rfl:    ZINC GLUCONATE PO, Take 10 mg by mouth daily., Disp: , Rfl:    hydrochlorothiazide (HYDRODIURIL) 25 MG tablet, Take 12.5 mg by mouth daily. (Patient not taking: Reported on 02/28/2022), Disp: , Rfl:    hydrocortisone (ANUSOL-HC) 2.5 % rectal cream, Place 1 Application rectally 2 (two) times daily. (Patient not taking: Reported on 03/10/2022), Disp: 30 g, Rfl: 2  EXAM: BP Readings from Last 3 Encounters:  03/24/22 137/74  03/10/22 (!) 148/76  02/28/22 136/70    VITALS per patient if applicable:  nl bp no fever   GENERAL: alert, oriented,  in no acute distress no stridor o and speech normal   HEENT: atraumatic, conjunttiva clear, no obvious abnormalities on inspection of external nose and ears unable to visualize    no obvious SOB, gasping or wheezing   PSYCH/NEURO: pleasant and cooperative, no obvious depression or anxiety, speech and thought processing grossly intact   ASSESSMENT AND PLAN:  Discussed the following assessment and  plan:    ICD-10-CM   1. Nasal drainage  J34.89    asymmetric    2. Sinusitis, unspecified chronicity, unspecified location  J32.9      Most likely uri  viral  w some localizing   sx ;  local care saline washes  If  persistent or progressive and localized can add antibiotic  Going on road trip and can get filled to take in case needed Counseled.   Expectant management and discussion of plan and treatment with opportunity to ask questions and all were answered. The patient agreed with the plan and demonstrated an understanding of the instructions.   Advised to call back or seek an in-person evaluation if worsening  or having  further concerns  in interim. Return if symptoms worsen or fail to improve as expected.    Shanon Ace, MD

## 2022-03-25 ENCOUNTER — Inpatient Hospital Stay: Payer: Medicare HMO | Attending: Hematology | Admitting: Nurse Practitioner

## 2022-03-25 ENCOUNTER — Inpatient Hospital Stay: Payer: Medicare HMO

## 2022-03-25 ENCOUNTER — Other Ambulatory Visit: Payer: Self-pay | Admitting: Internal Medicine

## 2022-03-25 ENCOUNTER — Encounter: Payer: Self-pay | Admitting: Nurse Practitioner

## 2022-03-25 VITALS — BP 153/76 | HR 81 | Temp 98.4°F | Resp 18 | Ht 65.5 in | Wt 201.0 lb

## 2022-03-25 DIAGNOSIS — D0512 Intraductal carcinoma in situ of left breast: Secondary | ICD-10-CM | POA: Insufficient documentation

## 2022-03-25 DIAGNOSIS — K760 Fatty (change of) liver, not elsewhere classified: Secondary | ICD-10-CM | POA: Insufficient documentation

## 2022-03-25 DIAGNOSIS — C50411 Malignant neoplasm of upper-outer quadrant of right female breast: Secondary | ICD-10-CM

## 2022-03-25 DIAGNOSIS — Z9221 Personal history of antineoplastic chemotherapy: Secondary | ICD-10-CM | POA: Insufficient documentation

## 2022-03-25 DIAGNOSIS — Z170 Estrogen receptor positive status [ER+]: Secondary | ICD-10-CM | POA: Diagnosis not present

## 2022-03-25 DIAGNOSIS — M797 Fibromyalgia: Secondary | ICD-10-CM | POA: Insufficient documentation

## 2022-03-25 DIAGNOSIS — Z79899 Other long term (current) drug therapy: Secondary | ICD-10-CM | POA: Diagnosis not present

## 2022-03-25 DIAGNOSIS — R7401 Elevation of levels of liver transaminase levels: Secondary | ICD-10-CM | POA: Insufficient documentation

## 2022-03-25 DIAGNOSIS — Z8719 Personal history of other diseases of the digestive system: Secondary | ICD-10-CM | POA: Insufficient documentation

## 2022-03-25 DIAGNOSIS — E039 Hypothyroidism, unspecified: Secondary | ICD-10-CM | POA: Insufficient documentation

## 2022-03-25 DIAGNOSIS — E785 Hyperlipidemia, unspecified: Secondary | ICD-10-CM | POA: Insufficient documentation

## 2022-03-25 DIAGNOSIS — Z923 Personal history of irradiation: Secondary | ICD-10-CM | POA: Insufficient documentation

## 2022-03-25 DIAGNOSIS — C50911 Malignant neoplasm of unspecified site of right female breast: Secondary | ICD-10-CM

## 2022-03-25 LAB — CMP (CANCER CENTER ONLY)
ALT: 52 U/L — ABNORMAL HIGH (ref 0–44)
AST: 25 U/L (ref 15–41)
Albumin: 4.4 g/dL (ref 3.5–5.0)
Alkaline Phosphatase: 128 U/L — ABNORMAL HIGH (ref 38–126)
Anion gap: 5 (ref 5–15)
BUN: 21 mg/dL (ref 8–23)
CO2: 31 mmol/L (ref 22–32)
Calcium: 9.6 mg/dL (ref 8.9–10.3)
Chloride: 104 mmol/L (ref 98–111)
Creatinine: 0.68 mg/dL (ref 0.44–1.00)
GFR, Estimated: >60 mL/min (ref >60)
Glucose, Bld: 85 mg/dL (ref 70–99)
Potassium: 4.4 mmol/L (ref 3.5–5.1)
Sodium: 140 mmol/L (ref 135–145)
Total Bilirubin: 0.7 mg/dL (ref 0.3–1.2)
Total Protein: 7.3 g/dL (ref 6.5–8.1)

## 2022-03-25 LAB — CBC WITH DIFFERENTIAL (CANCER CENTER ONLY)
Abs Immature Granulocytes: 0.01 10*3/uL (ref 0.00–0.07)
Basophils Absolute: 0 10*3/uL (ref 0.0–0.1)
Basophils Relative: 0 %
Eosinophils Absolute: 0.1 10*3/uL (ref 0.0–0.5)
Eosinophils Relative: 2 %
HCT: 39.7 % (ref 36.0–46.0)
Hemoglobin: 13.4 g/dL (ref 12.0–15.0)
Immature Granulocytes: 0 %
Lymphocytes Relative: 23 %
Lymphs Abs: 1.5 10*3/uL (ref 0.7–4.0)
MCH: 30.9 pg (ref 26.0–34.0)
MCHC: 33.8 g/dL (ref 30.0–36.0)
MCV: 91.5 fL (ref 80.0–100.0)
Monocytes Absolute: 0.6 10*3/uL (ref 0.1–1.0)
Monocytes Relative: 9 %
Neutro Abs: 4.3 10*3/uL (ref 1.7–7.7)
Neutrophils Relative %: 66 %
Platelet Count: 214 10*3/uL (ref 150–400)
RBC: 4.34 MIL/uL (ref 3.87–5.11)
RDW: 12.9 % (ref 11.5–15.5)
WBC Count: 6.5 10*3/uL (ref 4.0–10.5)
nRBC: 0 % (ref 0.0–0.2)

## 2022-03-25 NOTE — Progress Notes (Signed)
[place]   [phone] [phone]   Clinic Follow up Note   Patient Care Team: Panosh, Standley Brooking, MD as PCP - General Arvella Nigh, MD as Consulting Physician (Obstetrics and Gynecology) Dr Peggye Form, MD as Consulting Physician (Orthopedic Surgery) Ladene Artist, MD as Consulting Physician (Gastroenterology) Melina Schools, MD as Consulting Physician (Orthopedic Surgery) Rockwell Germany, RN as Oncology Nurse Navigator Mauro Kaufmann, RN as Oncology Nurse Navigator Jovita Kussmaul, MD as Consulting Physician (General Surgery) Truitt Merle, MD as Consulting Physician (Hematology) Eppie Gibson, MD as Attending Physician (Radiation Oncology) Jari Pigg, MD as Consulting Physician (Dermatology) Serita Kyle, MD as Referring Physician (Cardiology) Alla Feeling, NP as Nurse Practitioner (Nurse Practitioner) Viona Gilmore, [facility] as Pharmacist (Pharmacist) 03/25/2022  CHIEF COMPLAINT: 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  SUMMARY OF ONCOLOGIC HISTORY: Oncology History Overview Note  Cancer Staging Ductal carcinoma in situ (DCIS) of left breast Staging form: Breast, AJCC 8th Edition - Clinical stage from 06/21/2020: Stage 0 (cTis (DCIS), cN0, cM0, G2, ER+, PR+, HER2: Not Assessed) - Signed by Truitt Merle, MD on 06/26/2020 Stage prefix: Initial diagnosis  Malignant neoplasm of upper-outer quadrant of right breast in female, estrogen receptor positive ([place]) Staging form: Breast, AJCC 8th Edition - Clinical stage from 06/21/2020: Stage IA (cT1b, cN0, cM0, G2, ER+, PR+, HER2-) - Signed by Truitt Merle, MD on 06/26/2020 Stage prefix: Initial diagnosis - Pathologic stage from 07/26/2020: Stage IA (pT1c, pN1a, cM0, G2, ER+, PR+, HER2-) - Signed by Truitt Merle, MD on 08/09/2020 Stage prefix: Initial diagnosis Nuclear grade: G2 Histologic grading system: 3 grade system Residual tumor (R): R0 - None    Malignant neoplasm of upper-outer  quadrant of right breast in female, estrogen receptor positive ([place])  06/08/2020 Mammogram   IMPRESSION: 1. 9 x 7 x 6 mm mass in the 12 o'clock position of the right breast, 2cmfn with imaging features highly suspicious for malignancy. 2. 4 mm group of indeterminate calcifications in the 12 o'clock position of the left breast and 4 mm group of indeterminate calcifications in the 1 o'clock position of the left breast. Together, the groups span an area measuring 3.9 cm.   06/21/2020 Cancer Staging   Staging form: Breast, AJCC 8th Edition - Clinical stage from 06/21/2020: Stage IA (cT1b, cN0, cM0, G2, ER+, PR+, HER2-) - Signed by Truitt Merle, MD on 06/26/2020 Stage prefix: Initial diagnosis   06/21/2020 Initial Biopsy   Diagnosis 1. Breast, right, needle core biopsy, 12 oc - INVASIVE MAMMARY CARCINOMA - MAMMARY CARCINOMA IN SITU - SEE COMMENT 2. Breast, left, needle core biopsy, 12 oc - MAMMARY CARCINOMA IN-SITU WITH NECROSIS AND CALCIFICATIONS - SEE COMMENT 3. Breast, left, needle core biopsy, 1 oc - MAMMARY CARCINOMA IN-SITU WITH NECROSIS AND CALCIFICATIONS - SEE COMMENT Microscopic Comment 1. The biopsy material shows an infiltrative proliferation of cells with large vesicular nuclei with inconspicuous nucleoli, arranged linearly and in small clusters. Based on the biopsy, the carcinoma appears Nottingham grade 2 of 3.  Addendum: 1. E-cadherin is POSITIVE supporting a ductal origin. 2. E-cadherin is POSITIVE supporting a ductal origin.  3. E-cadherin is positive supporting a ductal origin. The focus is less pronounced on the deeper sections and in isolation would likely be considered atypical ductal hyperplasia.   06/21/2020 Receptors her2   1. PROGNOSTIC INDICATORS Results: IMMUNOHISTOCHEMICAL AND MORPHOMETRIC ANALYSIS PERFORMED MANUALLY The tumor cells are EQUIVOCAL for Her2 (2+). HER2 by FISH will be performed  and the results reported separately Estrogen Receptor: 95%, POSITIVE,  STRONG STAINING INTENSITY Progesterone Receptor: 40%, POSITIVE, MODERATE STAINING INTENSITY Proliferation Marker Ki67: 10%  1. FLUORESCENCE IN-SITU HYBRIDIZATION Results: GROUP 5: HER2 **NEGATIVE** Equivocal form of amplification of the HER2 gene was detected in the IHC 2+ tissue sample received from this individual. HER2 FISH was performed by a technologist and cell imaging and analysis on the [employer].   06/25/2020 Initial Diagnosis   Malignant neoplasm of upper-outer quadrant of right breast in female, estrogen receptor positive ([place])   07/03/2020 Breast MRI   IMPRESSION: 1. Known RIGHT breast cancer, 12 o'clock axis, at anterior depth, measuring 1 cm greatest extent, manifesting as a spiculated enhancing mass on MRI, with associated biopsy clip. Expected post biopsy changes are seen within the adjacent outer RIGHT breast. 2. No evidence of additional multifocal or multicentric disease within the RIGHT breast. 3. Known LEFT breast DCIS within the slightly outer LEFT breast, at anterior depth, corresponding to the biopsy site labeled 1 o'clock axis, with associated enhancement only at the margins of the biopsy cavity measuring up to 5 mm greatest dimension. 4. Known LEFT breast DCIS within the upper central LEFT breast, at middle depth, corresponding to the biopsy site labeled 12 o'clock axis. Contiguous linear non-mass enhancement extends 2.3 cm superior-medial to the biopsy cavity, most likely post biopsy change but possibly contiguous extent of disease. 5. No evidence of additional multifocal or multicentric disease within the LEFT breast. 6. No evidence of metastatic lymphadenopathy.     07/26/2020 Cancer Staging   Staging form: Breast, AJCC 8th Edition - Pathologic stage from 07/26/2020: Stage IA (pT1c, pN1a, cM0, G2, ER+, PR+, HER2-) - Signed by Truitt Merle, MD on 08/09/2020 Stage prefix: Initial diagnosis Nuclear grade: G2 Histologic grading system: 3 grade system Residual tumor  (R): R0 - None   07/26/2020 Surgery   RIGHT BREAST LUMPECTOMY WITH RADIOACTIVE SEED AND SENTINEL LYMPH NODE BIOPSY by Dr Marlou Starks   07/26/2020 Pathology Results   FINAL MICROSCOPIC DIAGNOSIS:   A. LYMPH NODE, RIGHT AXILLARY #1, SENTINEL, EXCISION:  - Lymph node, negative for carcinoma (0/1)   B. LYMPH NODE, RIGHT AXILLARY, SENTINEL, EXCISION:  - Benign fibroadipose tissue, negative for carcinoma   C. BREAST, RIGHT, LUMPECTOMY:  - Invasive ductal carcinoma, 1.5 cm, grade 2  - Ductal carcinoma in situ, low grade  - Resection margins are negative for carcinoma; closest is the anterior margin of 0.2 cm  - Biopsy site changes  - See oncology table   D. BREAST, LEFT, LUMPECTOMY:  - Benign breast parenchyma with prominent biopsy-related changes  - Negative for residual ductal carcinoma in situ  - See oncology table   E. LYMPH NODE, RIGHT AXILLARY #2, SENTINEL, EXCISION:  - Invasive ductal carcinoma, see comment  COMMENT:  E.  Lymph node tissue is not identified.  Findings likely represent an entirely replaced lymph node with foci of extranodal extension.    07/26/2020 Miscellaneous   Mammaprint High Risk of Luminla Type B 29% risk of recurrence in 10 years if untreated.  Her Mammaprint index is -0.175 She has 94.6% benefit of chemotherapy and hormaonal therapy.      08/20/2020 Imaging   CT C/A/P IMPRESSION: 1. No definitive imaging findings to suggest metastatic disease in the chest, abdomen or pelvis. 2. Postoperative changes of bilateral lumpectomy and right axillary lymph node dissection with what appears to be a large postoperative seroma in the right axilla, as detailed above. Attention on follow-up studies is recommended to ensure  the stability or regression of this collection. 3. Additional incidental findings, as above.   08/28/2020 Imaging   Bone Scan IMPRESSION: No definite scintigraphic evidence of osseous metastases.   08/30/2020 - 11/02/2020 Chemotherapy    Adjuvant Docetaxel and Cytoxan q3weeks for 4 cycles starting 08/30/20. Given skin rash, changes taxol to Abraxane starting with C2 (09/21/20). Completed 11/02/20.    12/03/2020 - 01/15/2021 Radiation Therapy   Bilateral breast radiation and right regional lymph nodes   02/2021 -  Anti-estrogen oral therapy   Adjuvant exemestane   04/11/2021 Survivorship   SCP delivered by Cira Rue, NP   Ductal carcinoma in situ (DCIS) of left breast  06/08/2020 Mammogram   IMPRESSION: 1. 9 x 7 x 6 mm mass in the 12 o'clock position of the right breast, 2cmfn with imaging features highly suspicious for malignancy. 2. 4 mm group of indeterminate calcifications in the 12 o'clock position of the left breast and 4 mm group of indeterminate calcifications in the 1 o'clock position of the left breast. Together, the groups span an area measuring 3.9 cm.   06/21/2020 Cancer Staging   Staging form: Breast, AJCC 8th Edition - Clinical stage from 06/21/2020: Stage 0 (cTis (DCIS), cN0, cM0, G2, ER+, PR+, HER2: Not Assessed) - Signed by Truitt Merle, MD on 06/26/2020 Stage prefix: Initial diagnosis   06/21/2020 Initial Biopsy   Diagnosis 1. Breast, right, needle core biopsy, 12 oc - INVASIVE MAMMARY CARCINOMA - MAMMARY CARCINOMA IN SITU - SEE COMMENT 2. Breast, left, needle core biopsy, 12 oc - MAMMARY CARCINOMA IN-SITU WITH NECROSIS AND CALCIFICATIONS - SEE COMMENT 3. Breast, left, needle core biopsy, 1 oc - MAMMARY CARCINOMA IN-SITU WITH NECROSIS AND CALCIFICATIONS - SEE COMMENT Microscopic Comment 1. The biopsy material shows an infiltrative proliferation of cells with large vesicular nuclei with inconspicuous nucleoli, arranged linearly and in small clusters. Based on the biopsy, the carcinoma appears Nottingham grade 2 of 3.  Addendum: 1. E-cadherin is POSITIVE supporting a ductal origin. 2. E-cadherin is POSITIVE supporting a ductal origin.  3. E-cadherin is positive supporting a ductal origin. The focus is  less pronounced on the deeper sections and in isolation would likely be considered atypical ductal hyperplasia.   06/21/2020 Receptors her2   2. PROGNOSTIC INDICATORS Results: IMMUNOHISTOCHEMICAL AND MORPHOMETRIC ANALYSIS PERFORMED MANUALLY Estrogen Receptor: 95%, POSITIVE, STRONG STAINING INTENSITY Progesterone Receptor: 30%, POSITIVE, STRONG STAINING INTENSITY   06/25/2020 Initial Diagnosis   Ductal carcinoma in situ (DCIS) of left breast   07/03/2020 Breast MRI   IMPRESSION: 1. Known RIGHT breast cancer, 12 o'clock axis, at anterior depth, measuring 1 cm greatest extent, manifesting as a spiculated enhancing mass on MRI, with associated biopsy clip. Expected post biopsy changes are seen within the adjacent outer RIGHT breast. 2. No evidence of additional multifocal or multicentric disease within the RIGHT breast. 3. Known LEFT breast DCIS within the slightly outer LEFT breast, at anterior depth, corresponding to the biopsy site labeled 1 o'clock axis, with associated enhancement only at the margins of the biopsy cavity measuring up to 5 mm greatest dimension. 4. Known LEFT breast DCIS within the upper central LEFT breast, at middle depth, corresponding to the biopsy site labeled 12 o'clock axis. Contiguous linear non-mass enhancement extends 2.3 cm superior-medial to the biopsy cavity, most likely post biopsy change but possibly contiguous extent of disease. 5. No evidence of additional multifocal or multicentric disease within the LEFT breast. 6. No evidence of metastatic lymphadenopathy.     07/26/2020 Surgery   LEFT BREAST  LUMPECTOMY X 2  WITH RADIOACTIVE SEED LOCALIZATION by Dr Marlou Starks   07/26/2020 Pathology Results   FINAL MICROSCOPIC DIAGNOSIS:   A. LYMPH NODE, RIGHT AXILLARY #1, SENTINEL, EXCISION:  - Lymph node, negative for carcinoma (0/1)   B. LYMPH NODE, RIGHT AXILLARY, SENTINEL, EXCISION:  - Benign fibroadipose tissue, negative for carcinoma   C. BREAST, RIGHT,  LUMPECTOMY:  - Invasive ductal carcinoma, 1.5 cm, grade 2  - Ductal carcinoma in situ, low grade  - Resection margins are negative for carcinoma; closest is the anterior margin of 0.2 cm  - Biopsy site changes  - See oncology table   D. BREAST, LEFT, LUMPECTOMY:  - Benign breast parenchyma with prominent biopsy-related changes  - Negative for residual ductal carcinoma in situ  - See oncology table   E. LYMPH NODE, RIGHT AXILLARY #2, SENTINEL, EXCISION:  - Invasive ductal carcinoma, see comment  COMMENT:  E.  Lymph node tissue is not identified.  Findings likely represent an entirely replaced lymph node with foci of extranodal extension.    12/03/2020 - 01/15/2021 Radiation Therapy   Bilateral breast radiation and right regional lymph nodes   02/2021 -  Anti-estrogen oral therapy   Adjuvant exemestane   04/11/2021 Survivorship   SCP delivered by Cira Rue, NP     CURRENT THERAPY: Exemestane started 02/07/2021, switch to low-dose letrozole 10/22/2021  INTERVAL HISTORY: Ms. Sherman returns for follow-up as scheduled, last seen by Dr. Burr Medico 12/23/2021.  She underwent screening colonoscopy by Dr. Fuller Plan 03/10/2022 which showed 3 polyps, small internal and external hemorrhoids.  Path showed tubular adenomas.  She is on a 7-year recall.  She saw PCP yesterday for nasal drainage.  Today she presents by herself.  She tells me now she is in the anger phase of addressing her breast cancer, anger towards medication/chemo and side effects.  She feels less coordinated and off balance which her neurologist attributed to late effects of chemo. No fall. For past 2 months she has a mild tremor in her hands, plans to address at next visit with neuro.  She has been intermittently fasting, shaking is better after eating.  She also feels nervous all the time.  She plans to see endocrinology at [facility], NP on 11/29.  She has been doing her part to exercise, eat better although does "cheat"  occasionally" and does not drink wine.  She tried full dose letrozole for 2-3 weeks but could not tolerate it due to severe cramping and toes curling under making it difficult to stand/walk and negative impact on quality of life.  She stopped late August or September and feels better.  All other systems were reviewed with the patient and are negative.  MEDICAL HISTORY:  Past Medical History:  Diagnosis Date   Anal fissure    Atrial septal aneurysm / if pfo  echo 6 13  10/23/2011   Breast cancer ([place])    Cataract    both eyes   Depression    Fatty liver    "pre fatty liver"   Fibromyalgia    Headache(784.0)    hx of migraines when younger   Heart palpitations    hx with normal holter event monitoring   Hx: UTI (urinary tract infection)    Hyperlipidemia    Hypertension    Hypothyroidism    Obesity    Personal history of chemotherapy    Personal history of radiation therapy    Pneumonia 1972   hx of   Polyp of colon  Serrated adenoma of colon 08/2012   Skin cancer    basal, squamous cell   Sleep apnea    uses cpap    SURGICAL HISTORY: Past Surgical History:  Procedure Laterality Date   BREAST LUMPECTOMY     BREAST LUMPECTOMY WITH RADIOACTIVE SEED AND SENTINEL LYMPH NODE BIOPSY Right 07/26/2020   Procedure: RIGHT BREAST LUMPECTOMY WITH RADIOACTIVE SEED AND SENTINEL LYMPH NODE BIOPSY;  Surgeon: Jovita Kussmaul, MD;  Location: [place];  Service: General;  Laterality: Right;   BREAST LUMPECTOMY WITH RADIOACTIVE SEED LOCALIZATION Left 07/26/2020   Procedure: LEFT BREAST LUMPECTOMY X 2  WITH RADIOACTIVE SEED LOCALIZATION;  Surgeon: Jovita Kussmaul, MD;  Location: [place];  Service: General;  Laterality: Left;   COLONOSCOPY     DILATION AND CURETTAGE OF [place] ARTHROSCOPY     both in past   POLYPECTOMY     SHOULDER OPEN [place] Left 07/07/2013   Procedure: LEFT SHOULDER MINI OPEN SUBACROMIAL DECOMPRESSION ROTATOR CUFF  REPAIR AND POSSIBLE PATCH GRAFT ;  Surgeon: Johnn Hai, MD;  Location: [facility];  Service: Orthopedics;  Laterality: Left;  with interscaline block   SKIN CANCER EXCISION Bilateral    arm, legs, and chest   TONSILLECTOMY      I have reviewed the social history and family history with the patient and they are unchanged from previous note.  ALLERGIES:  is allergic to penicillins, taxotere [docetaxel], cefdinir, norvasc [amlodipine besylate], cetirizine, femara [letrozole], irbesartan, molds & smuts, pegfilgrastim, and tylenol [acetaminophen].  MEDICATIONS:  Current Outpatient Medications  Medication Sig Dispense Refill   amLODipine (NORVASC) 10 MG tablet Take 10 mg by mouth daily.     ARMOUR THYROID PO      Ascorbic Acid (VITAMIN C) 1000 MG tablet Take 1,000 mg by mouth daily.     b complex vitamins capsule Take 1 capsule by mouth daily.     Bioflavonoid Products (QUERCETIN COMPLEX IMMUNE PO) Take 1 capsule by mouth as needed.     Cholecalciferol (VITAMIN D) 50 MCG (2000 UT) tablet Take 4,000 Units by mouth daily.     doxycycline (VIBRA-TABS) 100 MG tablet Take 1 tablet (100 mg total) by mouth 2 (two) times daily. If needed for sinusitis 14 tablet 0   hydrochlorothiazide (HYDRODIURIL) 25 MG tablet Take 12.5 mg by mouth daily. (Patient not taking: Reported on 02/28/2022)     hydrocortisone (ANUSOL-HC) 2.5 % rectal cream Place 1 Application rectally 2 (two) times daily. (Patient not taking: Reported on 03/10/2022) 30 g 2   MAGNESIUM CARBONATE PO Take 1 tablet by mouth daily.     melatonin 5 MG TABS Take 5 mg by mouth at bedtime.     Menaquinone-7 (VITAMIN K2) 100 MCG CAPS Take 100 mcg by mouth daily.     Multiple Vitamin (MULTIVITAMIN) tablet Take 1 tablet by mouth daily.     OVER THE COUNTER MEDICATION Take 2 capsules by mouth in the morning and at bedtime. Infla Med     OVER THE COUNTER MEDICATION Take 1 capsule by mouth daily. Essential Pro     potassium chloride SA (KLOR-CON M) 20 MEQ  tablet TAKE ONE TABLET BY MOUTH ONE TIME DAILY 90 tablet 3   Probiotic Product (PROBIOTIC PO) Take 1 capsule by mouth daily.     UBIQUINOL PO Take by mouth.     ZINC GLUCONATE PO Take 10 mg by mouth daily.  No current facility-administered medications for this visit.    PHYSICAL EXAMINATION: ECOG PERFORMANCE STATUS: 1 - Symptomatic but completely ambulatory  Vitals:   03/25/22 1004  BP: (!) 153/76  Pulse: 81  Resp: 18  Temp: 98.4 F (36.9 C)  SpO2: 100%   Filed Weights   03/25/22 1004  Weight: 201 lb (91.2 kg)    GENERAL:alert, no distress and comfortable SKIN: no rash  EYES: sclera clear NECK: without mass LYMPH:  no palpable cervical or supraclavicular lymphadenopathy LUNGS: normal breathing effort HEART: no lower extremity edema ABDOMEN:abdomen soft, non-tender and normal bowel sounds NEURO: alert & oriented x 3 with fluent speech.  Mild b/l hand tremor Breast exam: Symmetrical without nipple discharge or inversion.  S/p right lumpectomy, incisions completely healed.  Mild lymphedema at the central breast near the areola.  No palpable mass or nodularity in either breast or axilla that I could appreciate.  LABORATORY DATA:  I have reviewed the data as listed    Latest Ref Rng & Units 03/25/2022    9:38 AM 12/23/2021   10:32 AM 10/23/2021   12:00 AM  CBC  WBC 4.0 - 10.5 K/uL 6.5  8.6  8.3      Hemoglobin 12.0 - 15.0 g/dL 13.4  13.7  13.9      Hematocrit 36.0 - 46.0 % 39.7  40.2  42      Platelets 150 - 400 K/uL 214  215  226         This result is from an external source.        Latest Ref Rng & Units 03/25/2022    9:38 AM 12/23/2021   10:32 AM 10/22/2021    8:27 AM  CMP  Glucose 70 - 99 mg/dL 85  109  93   BUN 8 - 23 mg/dL _0 Creatinine 0.44 - 1.00 mg/dL 0.68  0.77  0.73   Sodium 135 - 145 mmol/L 140  138  142   Potassium 3.5 - 5.1 mmol/L 4.4  3.9  3.7   Chloride 98 - 111 mmol/L 104  103  107   CO2 22 - 32 mmol/L _1 Calcium 8.9 -  10.3 mg/dL 9.6  9.8  9.7   Total Protein 6.5 - 8.1 g/dL 7.3  7.4  7.2   Total Bilirubin 0.3 - 1.2 mg/dL 0.7  0.7  0.6   Alkaline Phos 38 - 126 U/L 128  113  104   AST 15 - 41 U/L 25  19  32   ALT 0 - 44 U/L 52  29  46       RADIOGRAPHIC STUDIES: I have personally reviewed the radiological images as listed and agreed with the findings in the report. No results found.   ASSESSMENT & PLAN: [name] is a 66 y.o. female with         2. 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  -Mammaprint showed high risk Luminal Type B with 29% risk of distant recurrence.  -She completed adjuvant chemo with TC.  Due to allergy reaction to docetaxel, it was changed to Abraxane from cycle 3. -She received adjuvant radiation under Dr. Isidore Moos 7/25-01/15/21. -she started exemestane in late 01/2021. She is receiving financial assistance through "[employer]  [employer]" (see MyChart note from 06/11/21). -most recent mammogram 06/10/21 was benign. -surveillance -exemestane stopped 08/2021 for SE's, see #2.  She switched to low-dose letrozole in 10/22/2021 and increased to full dose in 12/2021 but did not tolerate it after 2-3 weeks and stopped.  Main side effects were severe cramping and toes curling making it difficult to ambulate and poor quality of life.  SEs have improved since stopping AI -Ms. Paterson appears stable.  Exam is benign, labs unremarkable except mild ALT/alk phos elevation.  No clinical concern for breast cancer recurrence. -We discussed antiestrogen therapy.  She did not tolerate exemestane or letrozole, she likely will not tolerate anastrozole.  Discussed proceeding with tamoxifen, starting at low dose; I reviewed the potential side effects and benefits.  She does not think she will try but is willing  to consider it, and will let me know her decision after the holidays -We will switch screening mammogram on 06/11/2022 to diagnostic.  If she proceeds with surveillance alone we discussed adding a screening MRI 6 months apart from mammograms, she is interested and will start in 2024 -Follow-up in 4 months, or sooner if needed  2.Symptom management: Generalized and right shoulder joint pain, breast/muscle pain, fatigue, brain fog, depressed mood, elevated BP  -tolerated exemestane from 03/2021 until 08/2021 with intolerable side effects including generalized bone/joint pain, breast/pectoralis pain, fatigue, brain fog, mood disturbance and elevated BP.   -stopped exemestane 08/2021 and SE's have essentially resolved. She much better overall -She switched to low-dose letrozole in 10/2021 and increased to full dose in 12/2021.  She stopped after 2-3 weeks due to severe cramps and toes curling with difficulty ambulating and poor quality of life  -We discussed trying tamoxifen as a last effort for antiestrogen therapy, she will consider it and let me know -She was previously referred to endocrinology but could not get an appointment with [place], she will see Jacolyn Reedy, NP at [place] on 11/29 -Space provided for reflection on her breast cancer journey and willingness about her current status  3.  Tremor -She has 2 month history of mild non-resting tremor in her hands -It does improve when she eats after intermittent fasting -We reviewed other potential etiologies including anxiety, med side effect, or multiple others -She plans to discuss with neurology at next visit 11/30   4. Bone Health -she reports her last DEXA was in 03/2019, showing osteopenia.  -repeat per Dr. Radene Knee   5. Comorbidities: Depression, Fibromyalgia, HLD, HTN, Hypothyroidism, H/o Skin cancer (squamous and basal cell), sleep apnea -She is followed for HTN. She takes her BP at home.  -On medications, continue to f/u with her other  physicians.    6.  Fatty liver and transaminitis -She has chronic intermittent transaminitis, attributes to fatty liver.  She is on low sugar diet, drinks a lot of water, does not drink alcohol, and avoids Tylenol -Liver morphology was unremarkable on staging CT CAP 4/22 -Her LFTs fluctuate, ALT/alk phos mildly elevated today -Continue monitoring   Plan: -Labs reviewed -Continue breast cancer surveillance -Diagnostic mammogram 06/11/2022 -She will consider Tamoxifen and let me know her decision after the holidays -F/up in 4 months, or sooner if needed -F/up tremor with neuro   All questions were answered. The patient knows to call the clinic with any problems, questions or concerns. No barriers to learning was detected. I spent 25 minutes counseling the patient face to face. The total time spent in the appointment was 40 minutes and more than 50% was  on counseling and review of test results.      Alla Feeling, NP 03/25/22

## 2022-03-26 DIAGNOSIS — M9901 Segmental and somatic dysfunction of cervical region: Secondary | ICD-10-CM | POA: Diagnosis not present

## 2022-03-26 DIAGNOSIS — M9903 Segmental and somatic dysfunction of lumbar region: Secondary | ICD-10-CM | POA: Diagnosis not present

## 2022-03-26 DIAGNOSIS — M531 Cervicobrachial syndrome: Secondary | ICD-10-CM | POA: Diagnosis not present

## 2022-03-26 DIAGNOSIS — M9902 Segmental and somatic dysfunction of thoracic region: Secondary | ICD-10-CM | POA: Diagnosis not present

## 2022-04-06 ENCOUNTER — Other Ambulatory Visit: Payer: Self-pay | Admitting: Internal Medicine

## 2022-04-07 ENCOUNTER — Ambulatory Visit: Payer: Medicare HMO

## 2022-04-07 DIAGNOSIS — E782 Mixed hyperlipidemia: Secondary | ICD-10-CM | POA: Diagnosis not present

## 2022-04-07 DIAGNOSIS — K760 Fatty (change of) liver, not elsewhere classified: Secondary | ICD-10-CM | POA: Diagnosis not present

## 2022-04-07 DIAGNOSIS — C50919 Malignant neoplasm of unspecified site of unspecified female breast: Secondary | ICD-10-CM | POA: Diagnosis not present

## 2022-04-07 DIAGNOSIS — R7309 Other abnormal glucose: Secondary | ICD-10-CM | POA: Diagnosis not present

## 2022-04-07 DIAGNOSIS — E039 Hypothyroidism, unspecified: Secondary | ICD-10-CM | POA: Diagnosis not present

## 2022-04-07 DIAGNOSIS — R5383 Other fatigue: Secondary | ICD-10-CM | POA: Diagnosis not present

## 2022-04-07 DIAGNOSIS — I10 Essential (primary) hypertension: Secondary | ICD-10-CM | POA: Diagnosis not present

## 2022-04-07 DIAGNOSIS — E041 Nontoxic single thyroid nodule: Secondary | ICD-10-CM | POA: Diagnosis not present

## 2022-04-10 DIAGNOSIS — E039 Hypothyroidism, unspecified: Secondary | ICD-10-CM | POA: Diagnosis not present

## 2022-04-10 DIAGNOSIS — M5459 Other low back pain: Secondary | ICD-10-CM | POA: Diagnosis not present

## 2022-04-10 DIAGNOSIS — G3184 Mild cognitive impairment, so stated: Secondary | ICD-10-CM | POA: Diagnosis not present

## 2022-04-10 DIAGNOSIS — R202 Paresthesia of skin: Secondary | ICD-10-CM | POA: Diagnosis not present

## 2022-04-10 DIAGNOSIS — M5417 Radiculopathy, lumbosacral region: Secondary | ICD-10-CM | POA: Diagnosis not present

## 2022-04-10 DIAGNOSIS — R259 Unspecified abnormal involuntary movements: Secondary | ICD-10-CM | POA: Diagnosis not present

## 2022-04-16 DIAGNOSIS — M531 Cervicobrachial syndrome: Secondary | ICD-10-CM | POA: Diagnosis not present

## 2022-04-16 DIAGNOSIS — M9902 Segmental and somatic dysfunction of thoracic region: Secondary | ICD-10-CM | POA: Diagnosis not present

## 2022-04-16 DIAGNOSIS — M9901 Segmental and somatic dysfunction of cervical region: Secondary | ICD-10-CM | POA: Diagnosis not present

## 2022-04-16 DIAGNOSIS — M9903 Segmental and somatic dysfunction of lumbar region: Secondary | ICD-10-CM | POA: Diagnosis not present

## 2022-04-22 IMAGING — MG MM BREAST BX W LOC DEV EA AD LESION IMG BX SPEC STEREO GUIDE*L*
5 series · 5 of 17 positions shown · non-contrast
Comparison: Previous exams.
COMPARISON: Previous exams.

Addendum:
CLINICAL DATA: 64-year-old female with an indeterminate 0.4 cm
group of calcifications in the left breast at the 12 o'clock
position and an indeterminate 0.4 cm group of calcifications in the
left breast at the 1 o'clock position.

EXAM:
LEFT BREAST STEREOTACTIC CORE NEEDLE BIOPSY

[L CC (1 of 2)]
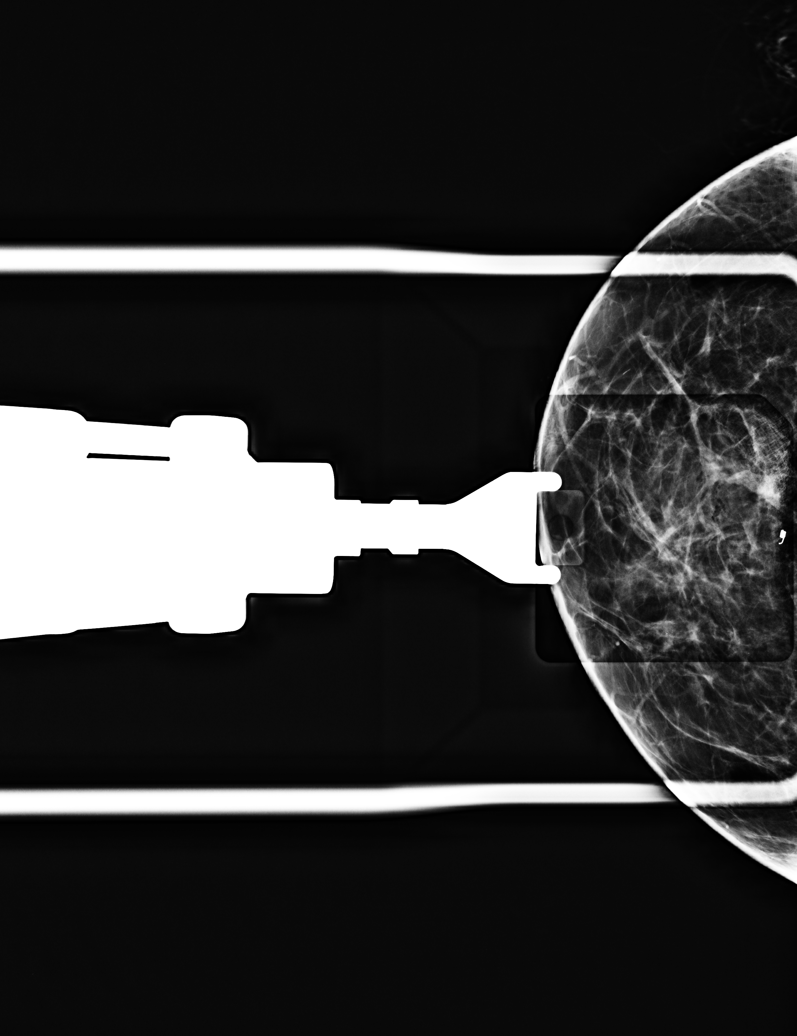

[L CC (2 of 2)]
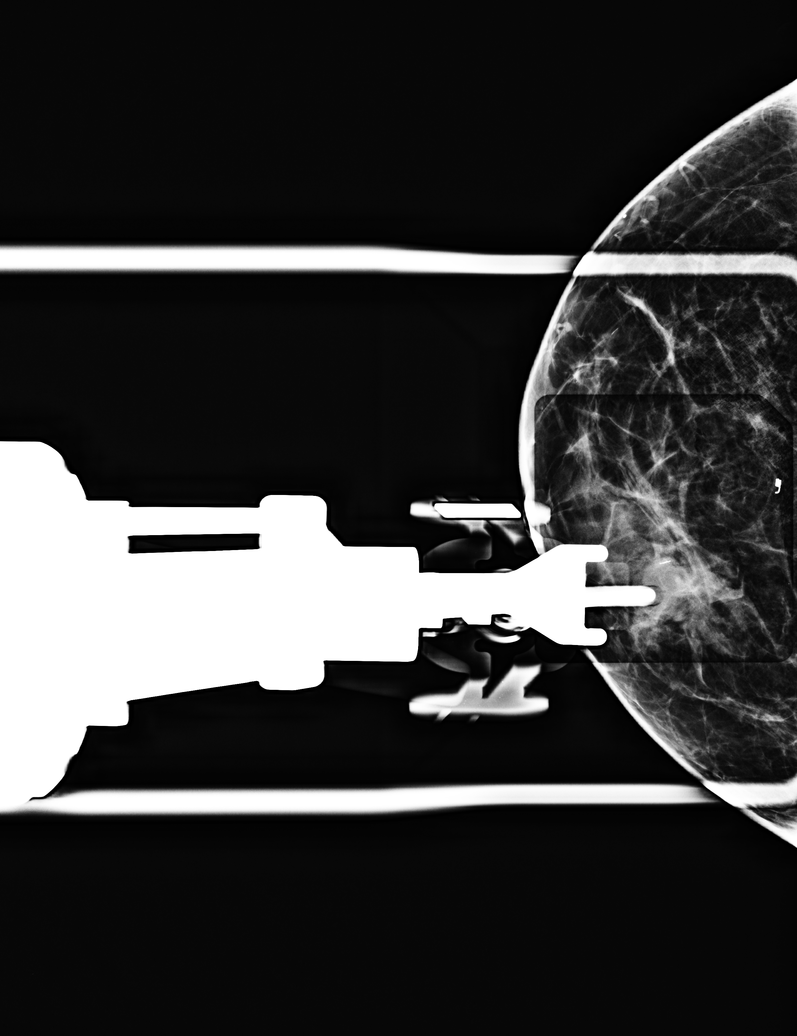

[L CC tomo (1 of 3) · tomo slice 25/50.0]
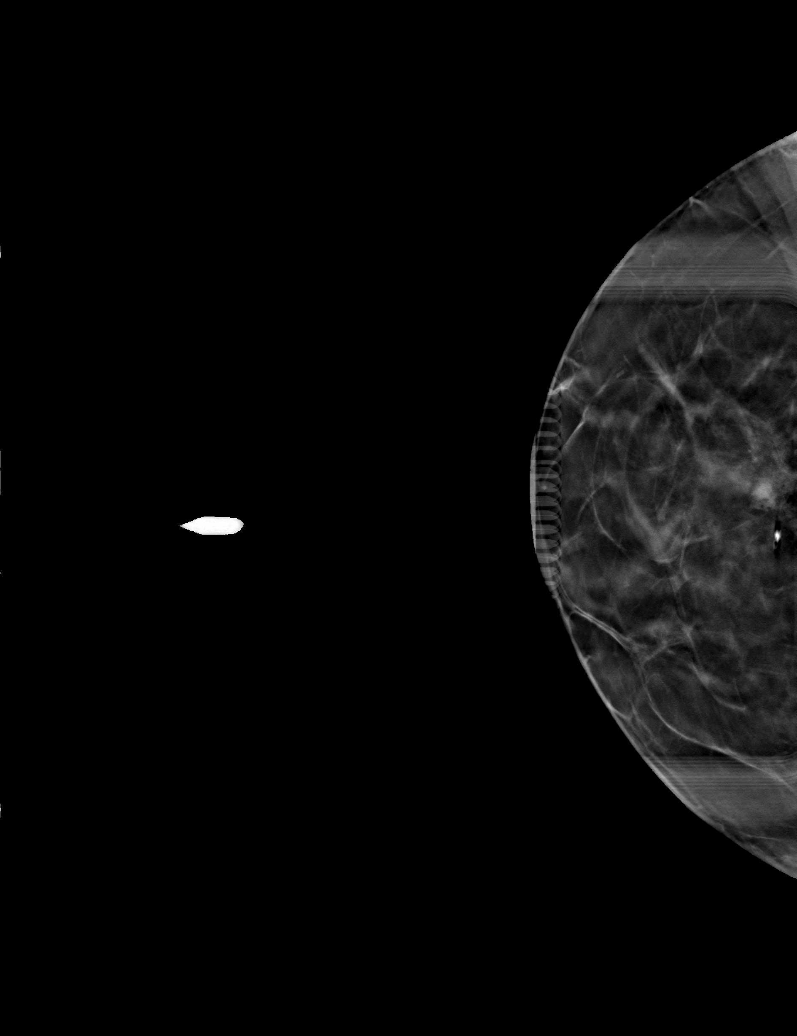

[L CC tomo (2 of 3) · tomo slice 27/52.0]
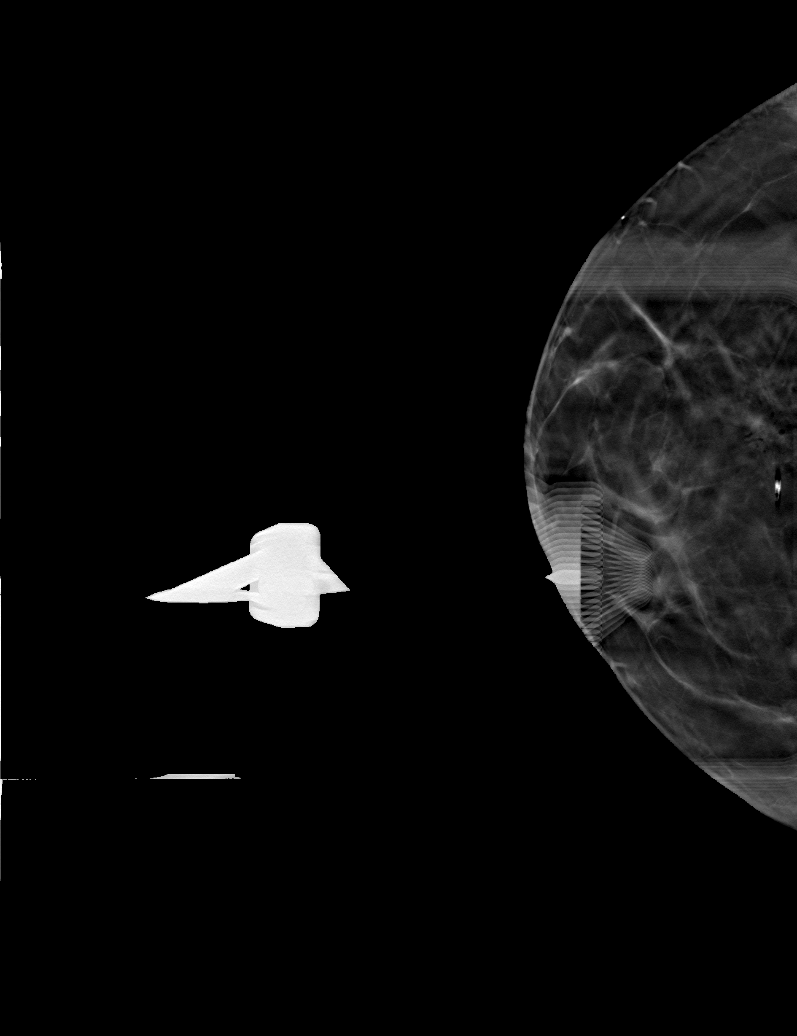

[L CC tomo (3 of 3) · tomo slice 27/52.0]
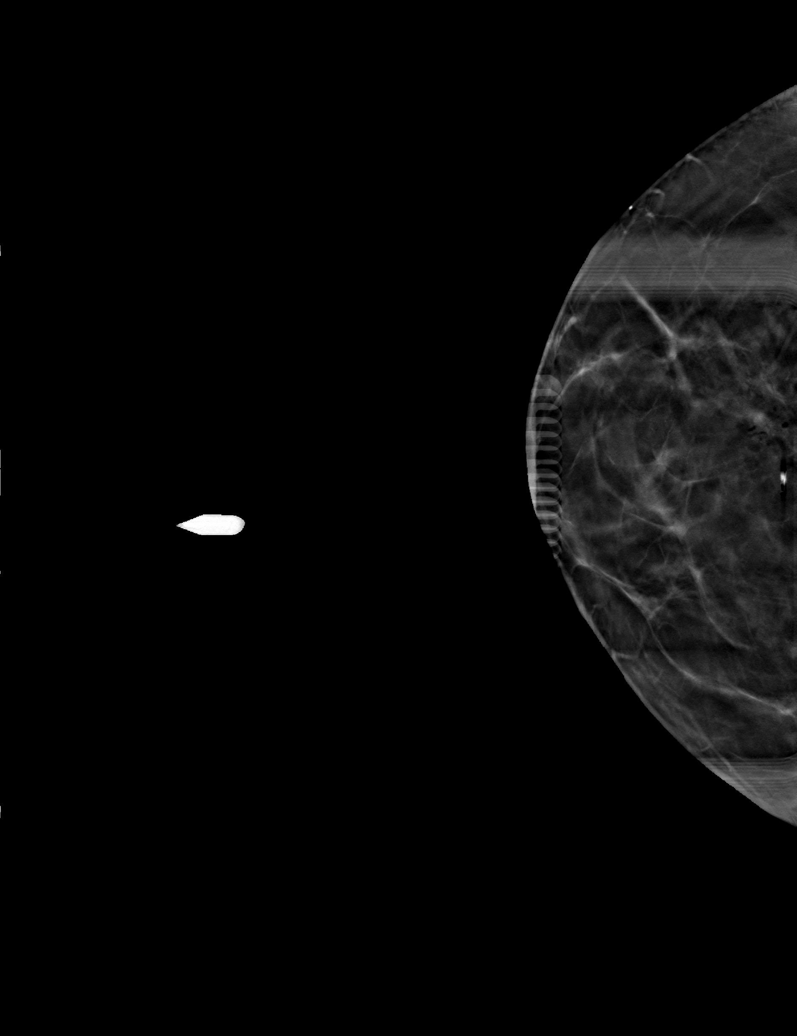

[5 of 17 positions shown; findings below may reference images not displayed]



SITE 1: LEFT BREAST 12 O'CLOCK: CALCIFICATIONS: Using sterile
technique and 1% Lidocaine as local anesthetic, under stereotactic
guidance, a 9 gauge vacuum assisted device was used to perform core
needle biopsy of the calcifications in the left breast at the 12
o'clock position using a superior to inferior approach. Specimen
radiograph was performed showing the presence of calcifications.
Specimens with calcifications are identified for pathology.

Lesion quadrant: Upper-outer

At the conclusion of the procedure, a coil shaped tissue marker clip
was deployed into the biopsy cavity. Follow-up 2-view mammogram was
performed and dictated separately.

SITE 2: LEFT BREAST 1 O'CLOCK: CALCIFICATIONS: Using sterile
technique and 1% Lidocaine as local anesthetic, under stereotactic
guidance, a 9 gauge vacuum assisted device was used to perform core
needle biopsy of the calcifications in the left breast at the 1
o'clock position using a superior to inferior approach. Specimen
radiograph was performed showing the presence of calcifications.
Specimens with calcifications are identified for pathology.

Lesion quadrant: Upper-outer

At the conclusion of the procedure, an X shaped tissue marker clip
was deployed into the biopsy cavity. Follow-up 2-view mammogram was
performed and dictated separately.
IMPRESSION: 1. Stereotactic guided biopsy of calcifications in the left breast
at the 12 o'clock position, at site of coil shaped biopsy marking
clip.

2. Stereotactic guided biopsy of calcifications in the left breast
at the 1 o'clock position, at site of X shaped biopsy marking clip.

ADDENDUM:
Pathology revealed GRADE II INVASIVE MAMMARY CARCINOMA, MAMMARY
CARCINOMA IN SITU of the RIGHT breast, 12 o'clock. E-cadherin is
POSITIVE supporting a ductal origin. This was found to be concordant
by Dr. Kleeven Arossout.

Pathology revealed MAMMARY CARCINOMA IN-SITU WITH NECROSIS AND
CALCIFICATIONS of the LEFT breast, 12 o'clock. E-cadherin is
POSITIVE supporting a ductal origin. This was found to be concordant
by Dr. Kleeven Arossout.

Pathology revealed MAMMARY CARCINOMA IN-SITU WITH NECROSIS AND
CALCIFICATIONS of the LEFT breast, 1 o'clock. E-cadherin is POSITIVE
supporting a ductal origin. This was found to be concordant by Dr.
Kleeven Arossout.

Pathology results were discussed with the patient by telephone. The
patient reported doing well after the biopsies with tenderness at
the sites. Post biopsy instructions and care were reviewed and
questions were answered. The patient was encouraged to call The

The patient was referred to [REDACTED]
[REDACTED] at [REDACTED] on
June 27, 2020. Consider bilateral breast MRI.

Pathology results reported by Aba Pinter RN on 06/25/2020.



SITE 1: LEFT BREAST 12 O'CLOCK: CALCIFICATIONS: Using sterile
technique and 1% Lidocaine as local anesthetic, under stereotactic
guidance, a 9 gauge vacuum assisted device was used to perform core
needle biopsy of the calcifications in the left breast at the 12
o'clock position using a superior to inferior approach. Specimen
radiograph was performed showing the presence of calcifications.
Specimens with calcifications are identified for pathology.

Lesion quadrant: Upper-outer

At the conclusion of the procedure, a coil shaped tissue marker clip
was deployed into the biopsy cavity. Follow-up 2-view mammogram was
performed and dictated separately.

SITE 2: LEFT BREAST 1 O'CLOCK: CALCIFICATIONS: Using sterile
technique and 1% Lidocaine as local anesthetic, under stereotactic
guidance, a 9 gauge vacuum assisted device was used to perform core
needle biopsy of the calcifications in the left breast at the 1
o'clock position using a superior to inferior approach. Specimen
radiograph was performed showing the presence of calcifications.
Specimens with calcifications are identified for pathology.

Lesion quadrant: Upper-outer

At the conclusion of the procedure, an X shaped tissue marker clip
was deployed into the biopsy cavity. Follow-up 2-view mammogram was
performed and dictated separately.
IMPRESSION: 1. Stereotactic guided biopsy of calcifications in the left breast
at the 12 o'clock position, at site of coil shaped biopsy marking
clip.

2. Stereotactic guided biopsy of calcifications in the left breast
at the 1 o'clock position, at site of X shaped biopsy marking clip.

## 2022-04-22 IMAGING — US US  BREAST BX W/ LOC DEV 1ST LESION IMG BX SPEC US GUIDE*R*
1 series · 11 of 11 positions shown · non-contrast
Comparison: Previous exam(s).
COMPARISON: Previous exam(s).

Addendum:
CLINICAL DATA: 64-year-old female with a suspicious 0.9 cm mass in
the right breast at the 12 o'clock position.

EXAM:
ULTRASOUND GUIDED RIGHT BREAST CORE NEEDLE BIOPSY

[Series 1: us breast bx w/ loc dev 1st lesion img bx spec us  · 0.06mm/px · 11 of 11 slices shown]
[im 1/11]
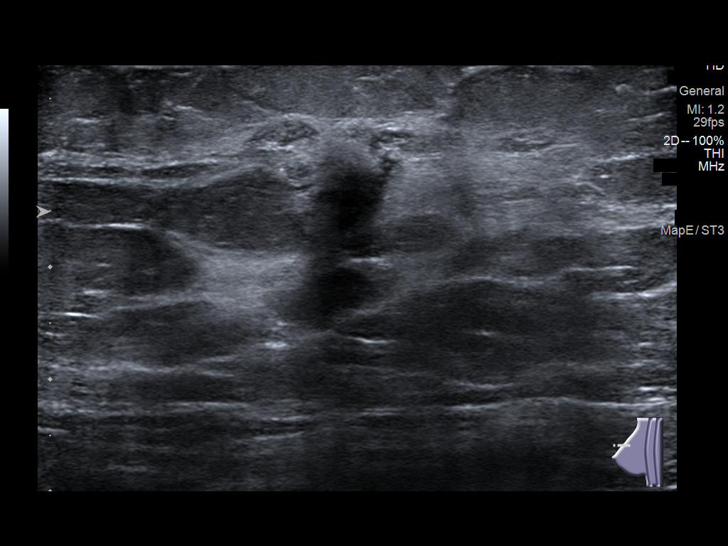
[im 2/11]
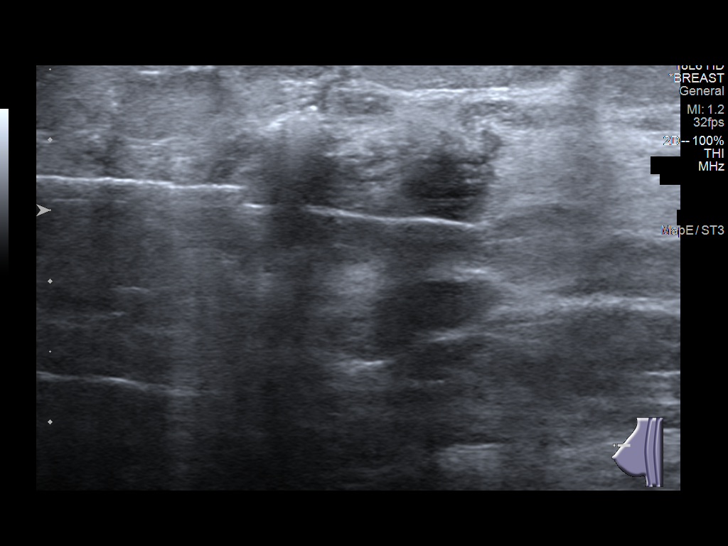
[im 3/11]
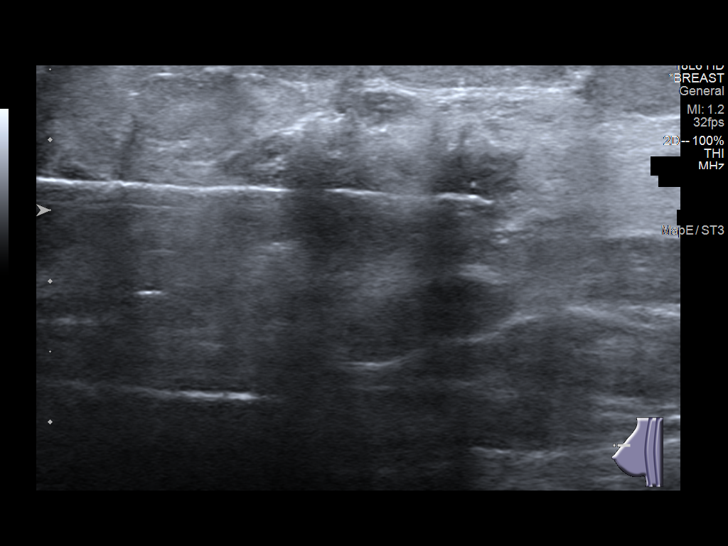
[im 4/11]
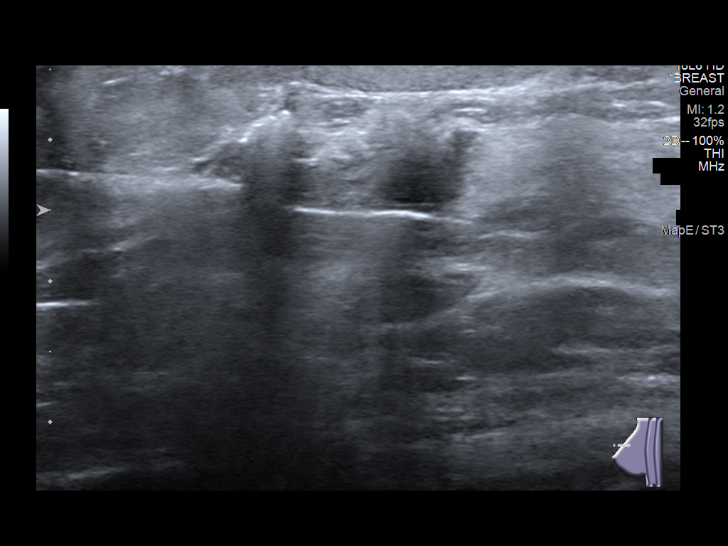
[im 5/11]
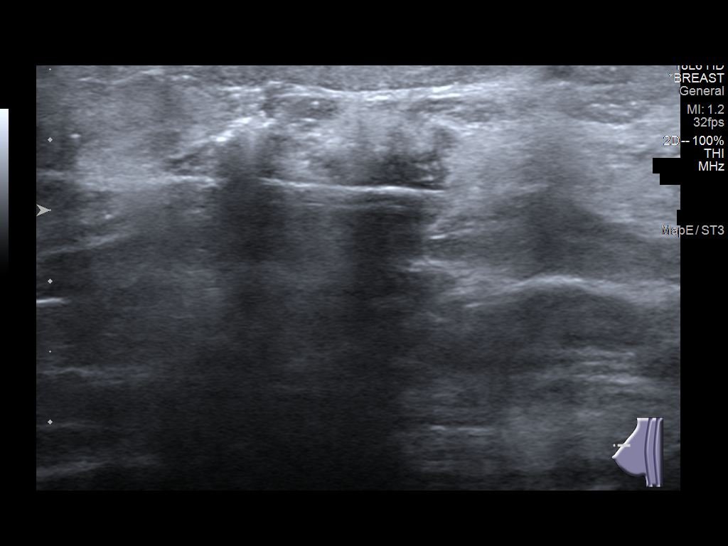
[im 6/11]
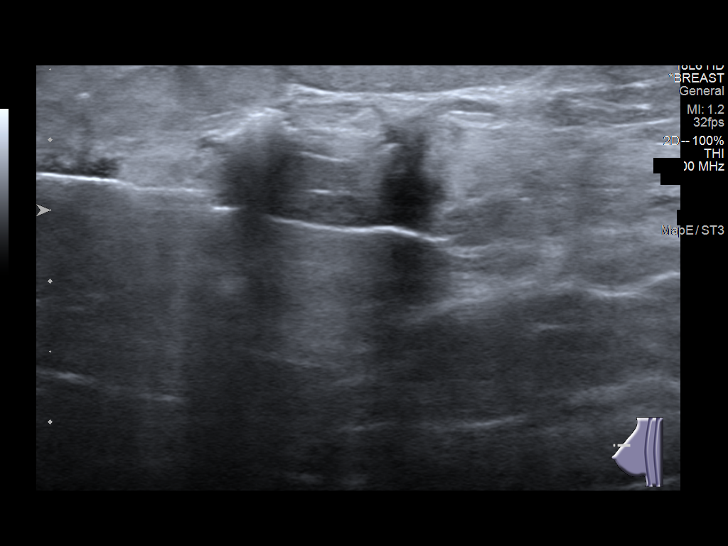
[im 7/11]
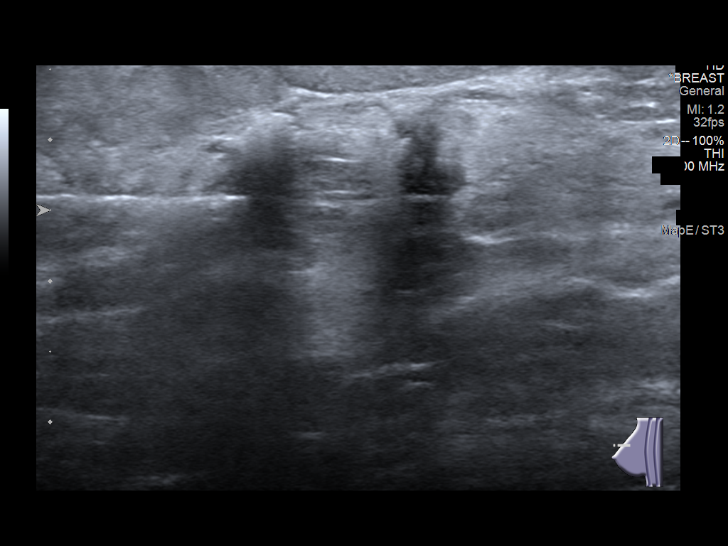
[im 8/11]
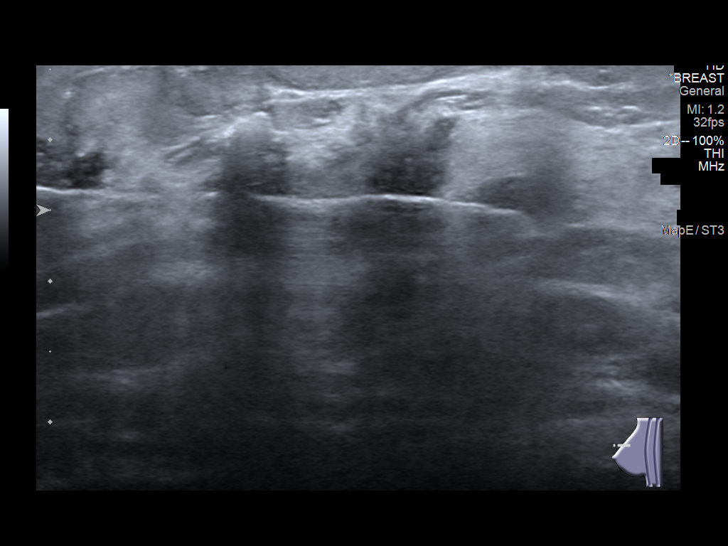
[im 9/11]
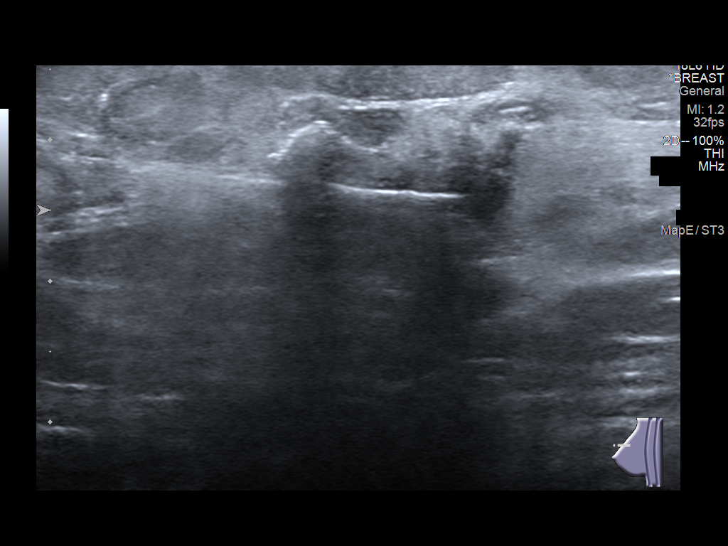
[im 10/11]
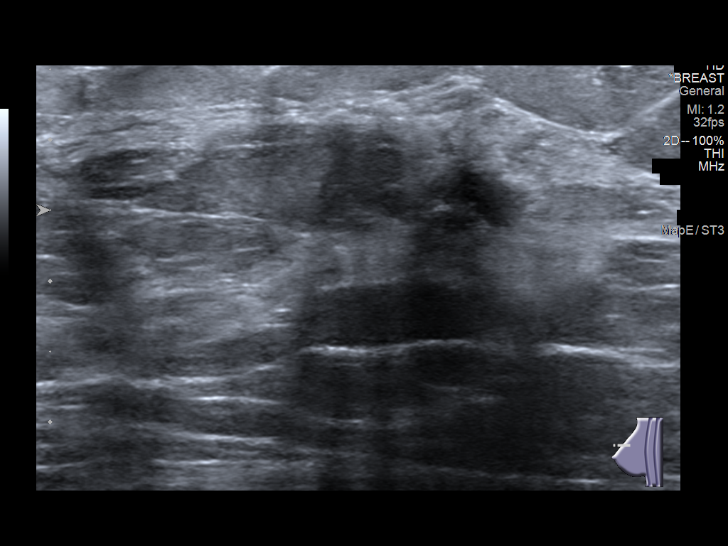
[im 11/11]
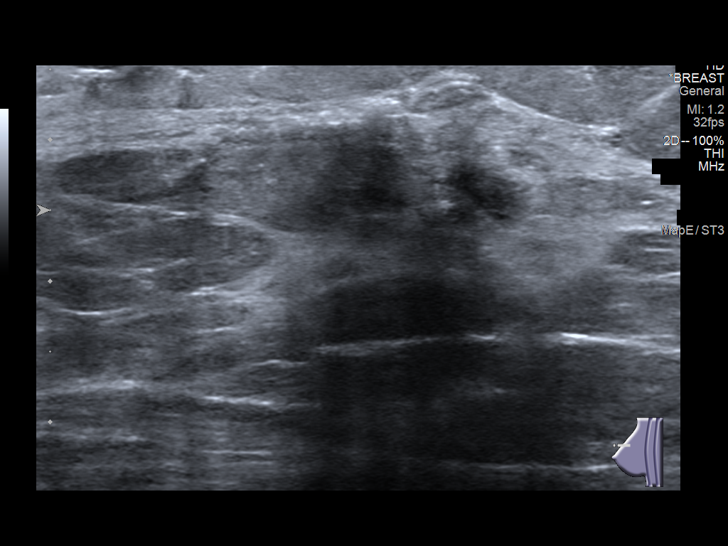

[11 of 11 positions shown; findings below may reference images not displayed]



Lesion quadrant: Upper inner

Using sterile technique and 1% Lidocaine as local anesthetic, under
direct ultrasound visualization, a 14 gauge Meiling device was
used to perform biopsy of the mass in the right breast at the 12
o'clock position using a lateral to medial approach. At the
conclusion of the procedure a ribbon shaped tissue marker clip was
deployed into the biopsy cavity. Follow up 2 view mammogram was
performed and dictated separately.
IMPRESSION: Ultrasound guided biopsy of the mass in the right breast at the 12
o'clock position. No apparent complications.

ADDENDUM:
Pathology revealed GRADE II INVASIVE MAMMARY CARCINOMA, MAMMARY
CARCINOMA IN SITU of the RIGHT breast, 12 o'clock. E-cadherin is
POSITIVE supporting a ductal origin. This was found to be concordant
by Dr. Adonay Badilla.

Pathology revealed MAMMARY CARCINOMA IN-SITU WITH NECROSIS AND
CALCIFICATIONS of the LEFT breast, 12 o'clock. E-cadherin is
POSITIVE supporting a ductal origin. This was found to be concordant
by Dr. Adonay Badilla.

Pathology revealed MAMMARY CARCINOMA IN-SITU WITH NECROSIS AND
CALCIFICATIONS of the LEFT breast, 1 o'clock. E-cadherin is POSITIVE
supporting a ductal origin. This was found to be concordant by Dr.
Adonay Badilla.

Pathology results were discussed with the patient by telephone. The
patient reported doing well after the biopsies with tenderness at
the sites. Post biopsy instructions and care were reviewed and
questions were answered. The patient was encouraged to call The

The patient was referred to [REDACTED]
[REDACTED] at [REDACTED] on
June 27, 2020. Consider bilateral breast MRI.

Pathology results reported by Ingunn Harpa Ronlor RN on 06/25/2020.



Lesion quadrant: Upper inner

Using sterile technique and 1% Lidocaine as local anesthetic, under
direct ultrasound visualization, a 14 gauge Meiling device was
used to perform biopsy of the mass in the right breast at the 12
o'clock position using a lateral to medial approach. At the
conclusion of the procedure a ribbon shaped tissue marker clip was
deployed into the biopsy cavity. Follow up 2 view mammogram was
performed and dictated separately.
IMPRESSION: Ultrasound guided biopsy of the mass in the right breast at the 12
o'clock position. No apparent complications.

## 2022-04-24 ENCOUNTER — Ambulatory Visit: Payer: Medicare HMO

## 2022-04-28 ENCOUNTER — Ambulatory Visit: Payer: Medicare HMO | Attending: Physician Assistant

## 2022-04-28 DIAGNOSIS — R279 Unspecified lack of coordination: Secondary | ICD-10-CM | POA: Insufficient documentation

## 2022-04-28 DIAGNOSIS — R293 Abnormal posture: Secondary | ICD-10-CM | POA: Diagnosis not present

## 2022-04-28 DIAGNOSIS — M62838 Other muscle spasm: Secondary | ICD-10-CM | POA: Diagnosis not present

## 2022-04-28 DIAGNOSIS — M6281 Muscle weakness (generalized): Secondary | ICD-10-CM | POA: Insufficient documentation

## 2022-04-28 NOTE — Patient Instructions (Signed)
Urge suppression technique: A technique to help you hold urine until it's an appropriate time to go, whether this is making it home or trying to reach a specific voiding time frame according to your schedule. It helps to send signals from the bladder to the brain that say you don't actually have to void urine right now. This most likely only give you several minutes of relief at first, but repeat as needed; the benefit will last longer as you use this technique more and get into better bladder habits. ?  The technique: o Perform 5 quick flicks (Kegels) rapidly, not worrying about fully relaxing in between each (only in this technique). o Then perform several deep belly breaths while focusing on relaxing the pelvic floor. o Go do something else to help distract yourself from the urge to urinate. o Repeat as needed.   Bladder retraining:  Drink 4-8oz an hour ONLY WATER Stop water intake 3 hours before bed Try to go at least 2-3 hours between trips to the bathroom - go whether you need to or not.  For two weeks    [facility] [address], [place], [place] [address] Phone # [phone] Fax [phone]

## 2022-04-28 NOTE — Therapy (Signed)
OUTPATIENT PHYSICAL THERAPY TREATMENT NOTE   Patient Name: [name] MRN: [id number] DOB:09/04/1955, 66 y.o., female Today's Date: 04/28/2022  PCP: Burnis Medin, MD REFERRING PROVIDER: Vladimir Crofts, PA-C  END OF SESSION:   PT End of Session - 04/28/22 1145     Visit Number 2    Date for PT Re-Evaluation 05/19/22    Authorization Type [employer]    PT Start Time 1145    PT Stop Time 1225    PT Time Calculation (min) 40 min    Activity Tolerance Patient tolerated treatment well    Behavior During Therapy WFL for tasks assessed/performed             Past Medical History:  Diagnosis Date   Anal fissure    Atrial septal aneurysm / if pfo  echo 6 13  10/23/2011   Breast cancer ([place])    Cataract    both eyes   Depression    Fatty liver    "pre fatty liver"   Fibromyalgia    Headache(784.0)    hx of migraines when younger   Heart palpitations    hx with normal holter event monitoring   Hx: UTI (urinary tract infection)    Hyperlipidemia    Hypertension    Hypothyroidism    Obesity    Personal history of chemotherapy    Personal history of radiation therapy    Pneumonia 1972   hx of   Polyp of colon    Serrated adenoma of colon 08/2012   Skin cancer    basal, squamous cell   Sleep apnea    uses cpap   Past Surgical History:  Procedure Laterality Date   BREAST LUMPECTOMY     BREAST LUMPECTOMY WITH RADIOACTIVE SEED AND SENTINEL LYMPH NODE BIOPSY Right 07/26/2020   Procedure: RIGHT BREAST LUMPECTOMY WITH RADIOACTIVE SEED AND SENTINEL LYMPH NODE BIOPSY;  Surgeon: Jovita Kussmaul, MD;  Location: [place];  Service: General;  Laterality: Right;   BREAST LUMPECTOMY WITH RADIOACTIVE SEED LOCALIZATION Left 07/26/2020   Procedure: LEFT BREAST LUMPECTOMY X 2  WITH RADIOACTIVE SEED LOCALIZATION;  Surgeon: Jovita Kussmaul, MD;  Location: [place];  Service: General;  Laterality: Left;   COLONOSCOPY     DILATION AND CURETTAGE OF [place] ARTHROSCOPY     both in past   POLYPECTOMY     [place] Left 07/07/2013   Procedure: LEFT SHOULDER MINI OPEN SUBACROMIAL DECOMPRESSION ROTATOR CUFF REPAIR AND POSSIBLE PATCH GRAFT ;  Surgeon: Johnn Hai, MD;  Location: [facility];  Service: Orthopedics;  Laterality: Left;  with interscaline block   SKIN CANCER EXCISION Bilateral    arm, legs, and chest   TONSILLECTOMY     Patient Active Problem List   Diagnosis Date Noted   Malignant neoplasm of upper-outer quadrant of right breast in female, estrogen receptor positive ([place]) 06/25/2020   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   Hepatic steatosis 11/27/2014   Hx of adenomatous colonic polyps 11/27/2014   Visit for preventive health examination 03/28/2014   Hyperlipidemia 03/28/2014   Left rotator cuff tear 07/07/2013   Tear of left rotator cuff 07/07/2013   Rhinitis 06/01/2013   Chest wall deformity 04/12/2012   Atrial septal aneurysm / if pfo  echo 6 13  10/23/2011   OSA (obstructive sleep apnea) 11/27/2010   Dyslipidemia 11/01/2010   Flushing 08/29/2010  Labile hypertension 08/29/2010   MUSCLE CRAMPS, FOOT 06/10/2010   OTHER SLEEP DISTURBANCES 06/10/2010   ANXIETY, SITUATIONAL 05/08/2010   VERTIGO, POSITIONAL 03/15/2010   VITAMIN D DEFICIENCY 02/18/2010   OBESITY 02/18/2010   ALLERGIC RHINITIS 02/18/2010   PLANTAR FASCIITIS 02/18/2010   TWITCHING 06/11/2009   NUMBNESS, HAND 06/11/2009   CERVICAL STRAIN, ACUTE 06/11/2009   OTHER MALAISE AND FATIGUE 07/09/2007   HYPERTENSION 01/28/2007   Hypothyroidism 11/11/2006   COMMON MIGRAINE 11/11/2006   GERD 11/11/2006   FIBROMYALGIA 11/11/2006    REFERRING DIAG: K64.9 (ICD-10-CM) - Hemorrhoids, unspecified hemorrhoid type N81.4 (ICD-10-CM) - Cystocele with prolapse  THERAPY DIAG:  Abnormal posture  Muscle weakness (generalized)  Unspecified lack of coordination  Rationale for Evaluation and  Treatment Rehabilitation  PERTINENT HISTORY: Breast cancer dx 05/22/20, fibromyalgia, anal fissure, G6P6  PRECAUTIONS: hx breast cancer  SUBJECTIVE:                                                                                                                                                                                      SUBJECTIVE STATEMENT:  Pt states that she has been worse. She has tried the urge suppression technique some, but feels like she is not doing it correctly due to not being helpful. She would like to see a urogynecologist due to pessary not feeling like it fits well. She is still getting up multiple times a night to urinate.    PAIN:  Are you having pain? No   03/24/22 SUBJECTIVE STATEMENT: Pt states that when she has to go to the bathroom, she really has to go. She has a drive to [place] and is concerned. She does wear pessary, but has to take it out when she has bowel movement. Patient does states that she has breast pain - she is going to MD tomorrow. 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  Fluid intake: Yes: she limits fluid to some extent to stop urgency; she does drink 11 glasses of water a day and herbal tea     PAIN:  Are you having pain? Yes DDD NPRS scale: 1/10 Pain location:  low back   Pain type: aching Pain description: intermittent    Aggravating factors: not doing exercises Relieving factors: exercises   PRECAUTIONS: Other: previous cancer dx   WEIGHT BEARING RESTRICTIONS: No   FALLS:  Has patient fallen in last 6 months? No   LIVING ENVIRONMENT: Lives with: lives with their family Lives in: House/apartment     OCCUPATION: reitred   PLOF: Independent   PATIENT GOALS: improve urgency/leaking   PERTINENT HISTORY:  Breast cancer dx 05/22/20, fibromyalgia, anal fissure, G6P6 Sexual abuse: No  BOWEL MOVEMENT: Pain with bowel movement: No Type of bowel movement:Frequency 2x/day, has to take pessary out and Strain No Fully  empty rectum: Yes: - Leakage: No Pads: No Fiber supplement: Yes: psyllium husk   URINATION: Pain with urination: No Fully empty bladder: Yes: double voids Stream: Strong Urgency: Yes: if she does not go right away she will leak Frequency: every hour and a half - two hours Leakage: Urge to void, Walking to the bathroom, Coughing, Sneezing, and Laughing Pads: Yes: small pad   INTERCOURSE: Pain with intercourse:  no pain Ability to have vaginal penetration:  Yes: - Climax: - Marinoff Scale: 0/3   PREGNANCY: Vaginal deliveries 6 Tearing Yes: episiotomy C-section deliveries 0 Currently pregnant No   PROLAPSE: Cystocele pressure     OBJECTIVE:    03/20/22:   COGNITION: Overall cognitive status: Within functional limits for tasks assessed                          SENSATION: Light touch: Appears intact other than tingling in bil LE Proprioception: Appears intact    MUSCLE LENGTH:     FUNCTIONAL TESTS:      GAIT:   Comments: WNL   POSTURE: rounded shoulders, forward head, decreased lumbar lordosis, and posterior pelvic tilt       LUMBARAROM/PROM:   A/PROM A/PROM  eval  Flexion    Extension    Right lateral flexion    Left lateral flexion    Right rotation    Left rotation     (Blank rows = not tested)       LOWER EXTREMITY MMT:     PALPATION:   General  abdominal tenderness at midline just inferior to strenum                 External Perineal Exam dryness/redness                             Internal Pelvic Floor WNL   Patient confirms identification and approves PT to assess internal pelvic floor and treatment Yes   PELVIC MMT:   MMT eval  Vaginal 1-2/5, varying coordination with notable abdominal bracing and LE contraction; 3 second endurance; 6 repeat contractions with poor motor control  Internal Anal Sphincter    External Anal Sphincter    Puborectalis    Diastasis Recti 1 finger width separation with mild distortion   (Blank rows =  not tested)         TONE: low   PROLAPSE: Grade 3 anterior vaginal wall laxity without pessary in   TODAY'S TREATMENT 04/28/22:    Manual Internal vaginal pelvic floor exam No emotional/communication barriers or cognitive limitation. Patient is motivated to learn. Patient understands and agrees with treatment goals and plan. PT explains patient will be examined in standing, sitting, and lying down to see how their muscles and joints work. When they are ready, they will be asked to remove their underwear so PT can examine their perineum. The patient is also given the option of providing their own chaperone as one is not provided in our facility. The patient also has the right and is explained the right to defer or refuse any part of the evaluation or treatment including the internal exam. With the patient's consent, PT will use one gloved finger to gently assess the muscles of the pelvic floor, seeing how well it contracts and relaxes and if  there is muscle symmetry. After, the patient will get dressed and PT and patient will discuss exam findings and plan of care. PT and patient discuss plan of care, schedule, attendance policy and HEP activities.  Neuromuscular re-education: Pelvic floor contraction strengthening with breath coordination Quick flicks 10X Long holds 5 x 10 seconds Urge suppression flicks Therapeutic Activity: Bladder retraining Urge suppression technique                                                                                                                 DATE: 03/24/22 EVAL  Neuromuscular re-education: Pelvic floor contraction training: Pelvic floor contraction strengthening with breath coordination Quick flicks 32T Long holds 5 x 10 seconds Urge suppression flicks Self-care: Urge suppression technique Not performing strengthening while on toilet       PATIENT EDUCATION:  Education details: see above self-care Person educated: Patient Education  method: Explanation, Demonstration, Tactile cues, Verbal cues, and Handouts Education comprehension: verbalized understanding   HOME EXERCISE PROGRAM: Written handout given (medbridge down)   ASSESSMENT:   CLINICAL IMPRESSION: Due to not having been to PT since initial evaluation over a month ago, patient has not seen any improvement in condition. Pelvic floor exam performed today with findings of improved and more consistent pelvic floor contraction with breath. She was able to perform quick flicks, long holds, and urge suppression technique with better motor control. We discussed strict bladder retraining in order to start helping reduce nocturia and improve urgency/leaking symptoms. She will continue to benefit from skilled PT intervention in order to improve pelvic floor strength and coordination, begin and progress pelvic floor/core functional strengthening program, and decrease urinary urgency/incontinence in order to improve QOL.    OBJECTIVE IMPAIRMENTS: decreased activity tolerance, decreased coordination, decreased endurance, decreased strength, increased fascial restrictions, increased muscle spasms, impaired tone, postural dysfunction, and pain.    ACTIVITY LIMITATIONS: continence   PARTICIPATION LIMITATIONS: community activity   PERSONAL FACTORS: 1-2 comorbidities: Breast cancer dx 05/22/20, fibromyalgia, anal fissure, G6P6  are also affecting patient's functional outcome.    REHAB POTENTIAL: Good   CLINICAL DECISION MAKING: Stable/uncomplicated   EVALUATION COMPLEXITY: Low     GOALS: Goals reviewed with patient? Yes   SHORT TERM GOALS: Target date: 04/21/2022 - updated 04/28/22   Pt will be independent with HEP.    Baseline: Goal status: MET 04/28/22   2.  Pt will be independent with the knack, urge suppression technique, and double voiding in order to improve bladder habits and decrease urinary incontinence.    Baseline:  Goal status: IN PROGRESS   3.  Pt will  be able to correctly perform diaphragmatic breathing and appropriate pressure management in order to prevent worsening vaginal wall laxity and improve pelvic floor A/ROM.    Baseline:  Goal status: IN PROGRESS       LONG TERM GOALS: Target date: 05/19/2022 - updated 04/28/22   Pt will be independent with advanced HEP.    Baseline:  Goal status: IN PROGRESS   2.  Pt will be  independent with the knack, urge suppression technique, and double voiding in order to improve bladder habits and decrease urinary incontinence.    Baseline:  Goal status: INITIALIN PROGRESS  3.  Pt will be able to go 2-3 hours in between voids without urgency or incontinence in order to improve QOL and perform all functional activities with less difficulty.    Baseline:  Goal status: IN PROGRESS   4.  Pt will be able to go on long car trips without having to stop frequently to urinate.  Baseline:  Goal status:IN PROGRESS   5. Pt will report no episodes of urinary incontinence in order to improve confidence in community activities and personal hygiene.    Baseline:  Goal status: IN PROGRESS       PLAN:   PT FREQUENCY: 1x/week   PT DURATION: 8 weeks   PLANNED INTERVENTIONS: Therapeutic exercises, Therapeutic activity, Neuromuscular re-education, Balance training, Gait training, Patient/Family education, Self Care, Joint mobilization, Dry Needling, Biofeedback, and Manual therapy   PLAN FOR NEXT SESSION: Progress pelvic floor strengthening to various standing positions and with different weight shifting; begin core strengthening.   Heather Roberts, PT, DPT12/18/2310:43 AM  Heather Roberts, PT, DPT12/18/2312:31 PM

## 2022-05-07 ENCOUNTER — Ambulatory Visit: Payer: Medicare HMO

## 2022-05-07 DIAGNOSIS — R279 Unspecified lack of coordination: Secondary | ICD-10-CM | POA: Diagnosis not present

## 2022-05-07 DIAGNOSIS — M6281 Muscle weakness (generalized): Secondary | ICD-10-CM

## 2022-05-07 DIAGNOSIS — M62838 Other muscle spasm: Secondary | ICD-10-CM | POA: Diagnosis not present

## 2022-05-07 DIAGNOSIS — R293 Abnormal posture: Secondary | ICD-10-CM

## 2022-05-07 NOTE — Therapy (Signed)
OUTPATIENT PHYSICAL THERAPY TREATMENT NOTE   Patient Name: [name] MRN: [id number] DOB:[birth year], 66 y.o., female Today's Date: 05/07/2022  PCP: Burnis Medin, MD REFERRING PROVIDER: Vladimir Crofts, PA-C  END OF SESSION:   PT End of Session - 05/07/22 1143     Visit Number 3    Date for PT Re-Evaluation 05/19/22    Authorization Type [employer]    PT Start Time 1145    PT Stop Time 1225    PT Time Calculation (min) 40 min    Activity Tolerance Patient tolerated treatment well    Behavior During Therapy WFL for tasks assessed/performed              Past Medical History:  Diagnosis Date   Anal fissure    Atrial septal aneurysm / if pfo  echo 6 13  10/23/2011   Breast cancer ([place])    Cataract    both eyes   Depression    Fatty liver    "pre fatty liver"   Fibromyalgia    Headache(784.0)    hx of migraines when younger   Heart palpitations    hx with normal holter event monitoring   Hx: UTI (urinary tract infection)    Hyperlipidemia    Hypertension    Hypothyroidism    Obesity    Personal history of chemotherapy    Personal history of radiation therapy    Pneumonia 1972   hx of   Polyp of colon    Serrated adenoma of colon 08/2012   Skin cancer    basal, squamous cell   Sleep apnea    uses cpap   Past Surgical History:  Procedure Laterality Date   BREAST LUMPECTOMY     BREAST LUMPECTOMY WITH RADIOACTIVE SEED AND SENTINEL LYMPH NODE BIOPSY Right 07/26/2020   Procedure: RIGHT BREAST LUMPECTOMY WITH RADIOACTIVE SEED AND SENTINEL LYMPH NODE BIOPSY;  Surgeon: Jovita Kussmaul, MD;  Location: [facility];  Service: General;  Laterality: Right;   BREAST LUMPECTOMY WITH RADIOACTIVE SEED LOCALIZATION Left 07/26/2020   Procedure: LEFT BREAST LUMPECTOMY X 2  WITH RADIOACTIVE SEED LOCALIZATION;  Surgeon: Jovita Kussmaul, MD;  Location: [facility];  Service: General;  Laterality: Left;   COLONOSCOPY     DILATION AND CURETTAGE OF [place] ARTHROSCOPY     both in past   POLYPECTOMY     [place] Left 07/07/2013   Procedure: LEFT SHOULDER MINI OPEN SUBACROMIAL DECOMPRESSION ROTATOR CUFF REPAIR AND POSSIBLE PATCH GRAFT ;  Surgeon: Johnn Hai, MD;  Location: [facility];  Service: Orthopedics;  Laterality: Left;  with interscaline block   SKIN CANCER EXCISION Bilateral    arm, legs, and chest   TONSILLECTOMY     Patient Active Problem List   Diagnosis Date Noted   Malignant neoplasm of upper-outer quadrant of right breast in female, estrogen receptor positive ([place]) 06/25/2020   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   Hepatic steatosis 11/27/2014   Hx of adenomatous colonic polyps 11/27/2014   Visit for preventive health examination 03/28/2014   Hyperlipidemia 03/28/2014   Left rotator cuff tear 07/07/2013   Tear of left rotator cuff 07/07/2013   Rhinitis 06/01/2013   Chest wall deformity 04/12/2012   Atrial septal aneurysm / if pfo  echo 6 13  10/23/2011   OSA (obstructive sleep apnea) 11/27/2010   Dyslipidemia 11/01/2010   Flushing  08/29/2010   Labile hypertension 08/29/2010   MUSCLE CRAMPS, FOOT 06/10/2010   OTHER SLEEP DISTURBANCES 06/10/2010   ANXIETY, SITUATIONAL 05/08/2010   VERTIGO, POSITIONAL 03/15/2010   VITAMIN D DEFICIENCY 02/18/2010   OBESITY 02/18/2010   ALLERGIC RHINITIS 02/18/2010   PLANTAR FASCIITIS 02/18/2010   TWITCHING 06/11/2009   NUMBNESS, HAND 06/11/2009   CERVICAL STRAIN, ACUTE 06/11/2009   OTHER MALAISE AND FATIGUE 07/09/2007   HYPERTENSION 01/28/2007   Hypothyroidism 11/11/2006   COMMON MIGRAINE 11/11/2006   GERD 11/11/2006   FIBROMYALGIA 11/11/2006    REFERRING DIAG: K64.9 (ICD-10-CM) - Hemorrhoids, unspecified hemorrhoid type N81.4 (ICD-10-CM) - Cystocele with prolapse  THERAPY DIAG:  Abnormal posture  Muscle weakness (generalized)  Other muscle spasm  Unspecified lack of coordination  Rationale  for Evaluation and Treatment Rehabilitation  PERTINENT HISTORY: Breast cancer dx 05/22/20, fibromyalgia, anal fissure, G6P6  PRECAUTIONS: hx breast cancer  SUBJECTIVE:                                                                                                                                                                                      SUBJECTIVE STATEMENT:  Pt states that she feels like urge suppression technique is very helpful and has stopped dribbling when she is undressing. She had a hard time stopping fluids prior to bed, but she is working on getting more water in throughout the day.    PAIN:  Are you having pain? No   03/24/22 SUBJECTIVE STATEMENT: Pt states that when she has to go to the bathroom, she really has to go. She has a drive to [place] and is concerned. She does wear pessary, but has to take it out when she has bowel movement. Patient does states that she has breast pain - she is going to MD tomorrow. 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  Fluid intake: Yes: she limits fluid to some extent to stop urgency; she does drink 11 glasses of water a day and herbal tea     PAIN:  Are you having pain? Yes DDD NPRS scale: 1/10 Pain location:  low back   Pain type: aching Pain description: intermittent    Aggravating factors: not doing exercises Relieving factors: exercises   PRECAUTIONS: Other: previous cancer dx   WEIGHT BEARING RESTRICTIONS: No   FALLS:  Has patient fallen in last 6 months? No   LIVING ENVIRONMENT: Lives with: lives with their family Lives in: House/apartment     OCCUPATION: reitred   PLOF: Independent   PATIENT GOALS: improve urgency/leaking   PERTINENT HISTORY:  Breast cancer dx 05/22/20, fibromyalgia, anal fissure, G6P6 Sexual abuse: No   BOWEL MOVEMENT: Pain with bowel  movement: No Type of bowel movement:Frequency 2x/day, has to take pessary out and Strain No Fully empty rectum: Yes: - Leakage: No Pads:  No Fiber supplement: Yes: psyllium husk   URINATION: Pain with urination: No Fully empty bladder: Yes: double voids Stream: Strong Urgency: Yes: if she does not go right away she will leak Frequency: every hour and a half - two hours Leakage: Urge to void, Walking to the bathroom, Coughing, Sneezing, and Laughing Pads: Yes: small pad   INTERCOURSE: Pain with intercourse:  no pain Ability to have vaginal penetration:  Yes: - Climax: - Marinoff Scale: 0/3   PREGNANCY: Vaginal deliveries 6 Tearing Yes: episiotomy C-section deliveries 0 Currently pregnant No   PROLAPSE: Cystocele pressure     OBJECTIVE:    03/20/22:   COGNITION: Overall cognitive status: Within functional limits for tasks assessed                          SENSATION: Light touch: Appears intact other than tingling in bil LE Proprioception: Appears intact    MUSCLE LENGTH:     FUNCTIONAL TESTS:      GAIT:   Comments: WNL   POSTURE: rounded shoulders, forward head, decreased lumbar lordosis, and posterior pelvic tilt       LUMBARAROM/PROM:   A/PROM A/PROM  eval  Flexion    Extension    Right lateral flexion    Left lateral flexion    Right rotation    Left rotation     (Blank rows = not tested)       LOWER EXTREMITY MMT:     PALPATION:   General  abdominal tenderness at midline just inferior to strenum                 External Perineal Exam dryness/redness                             Internal Pelvic Floor WNL   Patient confirms identification and approves PT to assess internal pelvic floor and treatment Yes   PELVIC MMT:   MMT eval  Vaginal 1-2/5, varying coordination with notable abdominal bracing and LE contraction; 3 second endurance; 6 repeat contractions with poor motor control  Internal Anal Sphincter    External Anal Sphincter    Puborectalis    Diastasis Recti 1 finger width separation with mild distortion   (Blank rows = not tested)         TONE: low    PROLAPSE: Grade 3 anterior vaginal wall laxity without pessary in   TODAY'S TREATMENT 05/07/22 Neuromuscular re-education: Pelvic floor contractions in standing Regular stance Wide stance Tandem stance Seated hip adduction with ball 10x Seated hip abduction with blue band 10x Seated horizontal abduction with green band 10x   TREATMENT 04/28/22:    Manual Internal vaginal pelvic floor exam No emotional/communication barriers or cognitive limitation. Patient is motivated to learn. Patient understands and agrees with treatment goals and plan. PT explains patient will be examined in standing, sitting, and lying down to see how their muscles and joints work. When they are ready, they will be asked to remove their underwear so PT can examine their perineum. The patient is also given the option of providing their own chaperone as one is not provided in our facility. The patient also has the right and is explained the right to defer or refuse any part of the evaluation or treatment  including the internal exam. With the patient's consent, PT will use one gloved finger to gently assess the muscles of the pelvic floor, seeing how well it contracts and relaxes and if there is muscle symmetry. After, the patient will get dressed and PT and patient will discuss exam findings and plan of care. PT and patient discuss plan of care, schedule, attendance policy and HEP activities.  Neuromuscular re-education: Pelvic floor contraction strengthening with breath coordination Quick flicks 36U Long holds 5 x 10 seconds Urge suppression flicks Therapeutic Activity: Bladder retraining Urge suppression technique                                                                                                                 DATE: 03/24/22 EVAL  Neuromuscular re-education: Pelvic floor contraction training: Pelvic floor contraction strengthening with breath coordination Quick flicks 44I Long holds 5 x 10  seconds Urge suppression flicks Self-care: Urge suppression technique Not performing strengthening while on toilet       PATIENT EDUCATION:  Education details: see above self-care Person educated: Patient Education method: Explanation, Demonstration, Tactile cues, Verbal cues, and Handouts Education comprehension: verbalized understanding   HOME EXERCISE PROGRAM: [id number]   ASSESSMENT:   CLINICAL IMPRESSION: Pt doing very well with increased pelvic floor awareness. She did very well with standing pelvic floor contractions; she had more difficulty with Rt tandem stance due to Rt sided issues in general. Good ability to find core contraction with pelvic floor and on exhale. Good tolerance to all functional strengthening exercise progressions. She will continue to benefit from skilled PT intervention in order to improve pelvic floor strength and coordination, begin and progress pelvic floor/core functional strengthening program, and decrease urinary urgency/incontinence in order to improve QOL.    OBJECTIVE IMPAIRMENTS: decreased activity tolerance, decreased coordination, decreased endurance, decreased strength, increased fascial restrictions, increased muscle spasms, impaired tone, postural dysfunction, and pain.    ACTIVITY LIMITATIONS: continence   PARTICIPATION LIMITATIONS: community activity   PERSONAL FACTORS: 1-2 comorbidities: Breast cancer dx 05/22/20, fibromyalgia, anal fissure, G6P6  are also affecting patient's functional outcome.    REHAB POTENTIAL: Good   CLINICAL DECISION MAKING: Stable/uncomplicated   EVALUATION COMPLEXITY: Low     GOALS: Goals reviewed with patient? Yes   SHORT TERM GOALS: Target date: 04/21/2022 - updated 04/28/22   Pt will be independent with HEP.    Baseline: Goal status: MET 04/28/22   2.  Pt will be independent with the knack, urge suppression technique, and double voiding in order to improve bladder habits and decrease urinary  incontinence.    Baseline:  Goal status: IN PROGRESS   3.  Pt will be able to correctly perform diaphragmatic breathing and appropriate pressure management in order to prevent worsening vaginal wall laxity and improve pelvic floor A/ROM.    Baseline:  Goal status: IN PROGRESS       LONG TERM GOALS: Target date: 05/19/2022 - updated 04/28/22   Pt will be independent with advanced HEP.    Baseline:  Goal status:  IN PROGRESS   2.  Pt will be independent with the knack, urge suppression technique, and double voiding in order to improve bladder habits and decrease urinary incontinence.    Baseline:  Goal status: INITIALIN PROGRESS  3.  Pt will be able to go 2-3 hours in between voids without urgency or incontinence in order to improve QOL and perform all functional activities with less difficulty.    Baseline:  Goal status: IN PROGRESS   4.  Pt will be able to go on long car trips without having to stop frequently to urinate.  Baseline:  Goal status:IN PROGRESS   5. Pt will report no episodes of urinary incontinence in order to improve confidence in community activities and personal hygiene.    Baseline:  Goal status: IN PROGRESS       PLAN:   PT FREQUENCY: 1x/week   PT DURATION: 8 weeks   PLANNED INTERVENTIONS: Therapeutic exercises, Therapeutic activity, Neuromuscular re-education, Balance training, Gait training, Patient/Family education, Self Care, Joint mobilization, Dry Needling, Biofeedback, and Manual therapy   PLAN FOR NEXT SESSION: Progress pelvic floor strengthening to various standing positions and with different weight shifting; begin core strengthening.   Heather Roberts, PT, DPT12/18/2310:43 AM  Heather Roberts, PT, DPT12/27/2312:33 PM

## 2022-05-13 ENCOUNTER — Ambulatory Visit: Payer: Medicare HMO | Attending: Nurse Practitioner

## 2022-05-13 DIAGNOSIS — L578 Other skin changes due to chronic exposure to nonionizing radiation: Secondary | ICD-10-CM | POA: Diagnosis not present

## 2022-05-13 DIAGNOSIS — L710 Perioral dermatitis: Secondary | ICD-10-CM | POA: Diagnosis not present

## 2022-05-13 DIAGNOSIS — M6281 Muscle weakness (generalized): Secondary | ICD-10-CM | POA: Insufficient documentation

## 2022-05-13 DIAGNOSIS — D2271 Melanocytic nevi of right lower limb, including hip: Secondary | ICD-10-CM | POA: Diagnosis not present

## 2022-05-13 DIAGNOSIS — Z85828 Personal history of other malignant neoplasm of skin: Secondary | ICD-10-CM | POA: Diagnosis not present

## 2022-05-13 DIAGNOSIS — L821 Other seborrheic keratosis: Secondary | ICD-10-CM | POA: Diagnosis not present

## 2022-05-13 DIAGNOSIS — D2261 Melanocytic nevi of right upper limb, including shoulder: Secondary | ICD-10-CM | POA: Diagnosis not present

## 2022-05-13 DIAGNOSIS — L570 Actinic keratosis: Secondary | ICD-10-CM | POA: Diagnosis not present

## 2022-05-13 DIAGNOSIS — D225 Melanocytic nevi of trunk: Secondary | ICD-10-CM | POA: Diagnosis not present

## 2022-05-13 DIAGNOSIS — R293 Abnormal posture: Secondary | ICD-10-CM | POA: Insufficient documentation

## 2022-05-13 DIAGNOSIS — Z483 Aftercare following surgery for neoplasm: Secondary | ICD-10-CM | POA: Insufficient documentation

## 2022-05-13 DIAGNOSIS — Z86018 Personal history of other benign neoplasm: Secondary | ICD-10-CM | POA: Diagnosis not present

## 2022-05-13 DIAGNOSIS — M62838 Other muscle spasm: Secondary | ICD-10-CM | POA: Insufficient documentation

## 2022-05-13 DIAGNOSIS — R279 Unspecified lack of coordination: Secondary | ICD-10-CM | POA: Diagnosis not present

## 2022-05-13 DIAGNOSIS — D2272 Melanocytic nevi of left lower limb, including hip: Secondary | ICD-10-CM | POA: Diagnosis not present

## 2022-05-13 DIAGNOSIS — D2372 Other benign neoplasm of skin of left lower limb, including hip: Secondary | ICD-10-CM | POA: Diagnosis not present

## 2022-05-13 NOTE — Therapy (Signed)
OUTPATIENT PHYSICAL THERAPY TREATMENT NOTE   Patient Name: [name] MRN: [id number] DOB:05-Dec-1955, 67 y.o., female Today's Date: 05/13/2022  PCP: Burnis Medin, MD REFERRING PROVIDER: Vladimir Crofts, PA-C  END OF SESSION:   PT End of Session - 05/13/22 1443     Visit Number 4    Date for PT Re-Evaluation 05/19/22    Authorization Type [employer]    PT Start Time 4627    PT Stop Time 1525    PT Time Calculation (min) 40 min    Activity Tolerance Patient tolerated treatment well    Behavior During Therapy WFL for tasks assessed/performed              Past Medical History:  Diagnosis Date   Anal fissure    Atrial septal aneurysm / if pfo  echo 6 13  10/23/2011   Breast cancer ([place])    Cataract    both eyes   Depression    Fatty liver    "pre fatty liver"   Fibromyalgia    Headache(784.0)    hx of migraines when younger   Heart palpitations    hx with normal holter event monitoring   Hx: UTI (urinary tract infection)    Hyperlipidemia    Hypertension    Hypothyroidism    Obesity    Personal history of chemotherapy    Personal history of radiation therapy    Pneumonia 1972   hx of   Polyp of colon    Serrated adenoma of colon 08/2012   Skin cancer    basal, squamous cell   Sleep apnea    uses cpap   Past Surgical History:  Procedure Laterality Date   BREAST LUMPECTOMY     BREAST LUMPECTOMY WITH RADIOACTIVE SEED AND SENTINEL LYMPH NODE BIOPSY Right 07/26/2020   Procedure: RIGHT BREAST LUMPECTOMY WITH RADIOACTIVE SEED AND SENTINEL LYMPH NODE BIOPSY;  Surgeon: Jovita Kussmaul, MD;  Location: [facility];  Service: General;  Laterality: Right;   BREAST LUMPECTOMY WITH RADIOACTIVE SEED LOCALIZATION Left 07/26/2020   Procedure: LEFT BREAST LUMPECTOMY X 2  WITH RADIOACTIVE SEED LOCALIZATION;  Surgeon: Jovita Kussmaul, MD;  Location: [facility];  Service: General;  Laterality: Left;   COLONOSCOPY     DILATION AND CURETTAGE OF [place] ARTHROSCOPY     both in past   POLYPECTOMY     [place] Left 07/07/2013   Procedure: LEFT SHOULDER MINI OPEN SUBACROMIAL DECOMPRESSION ROTATOR CUFF REPAIR AND POSSIBLE PATCH GRAFT ;  Surgeon: Johnn Hai, MD;  Location: [facility];  Service: Orthopedics;  Laterality: Left;  with interscaline block   SKIN CANCER EXCISION Bilateral    arm, legs, and chest   TONSILLECTOMY     Patient Active Problem List   Diagnosis Date Noted   Malignant neoplasm of upper-outer quadrant of right breast in female, estrogen receptor positive ([place]) 06/25/2020   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   Hepatic steatosis 11/27/2014   Hx of adenomatous colonic polyps 11/27/2014   Visit for preventive health examination 03/28/2014   Hyperlipidemia 03/28/2014   Left rotator cuff tear 07/07/2013   Tear of left rotator cuff 07/07/2013   Rhinitis 06/01/2013   Chest wall deformity 04/12/2012   Atrial septal aneurysm / if pfo  echo 6 13  10/23/2011   OSA (obstructive sleep apnea) 11/27/2010   Dyslipidemia 11/01/2010   Flushing  08/29/2010   Labile hypertension 08/29/2010   MUSCLE CRAMPS, FOOT 06/10/2010   OTHER SLEEP DISTURBANCES 06/10/2010   ANXIETY, SITUATIONAL 05/08/2010   VERTIGO, POSITIONAL 03/15/2010   VITAMIN D DEFICIENCY 02/18/2010   OBESITY 02/18/2010   ALLERGIC RHINITIS 02/18/2010   PLANTAR FASCIITIS 02/18/2010   TWITCHING 06/11/2009   NUMBNESS, HAND 06/11/2009   CERVICAL STRAIN, ACUTE 06/11/2009   OTHER MALAISE AND FATIGUE 07/09/2007   HYPERTENSION 01/28/2007   Hypothyroidism 11/11/2006   COMMON MIGRAINE 11/11/2006   GERD 11/11/2006   FIBROMYALGIA 11/11/2006    REFERRING DIAG: K64.9 (ICD-10-CM) - Hemorrhoids, unspecified hemorrhoid type N81.4 (ICD-10-CM) - Cystocele with prolapse  THERAPY DIAG:  Abnormal posture  Muscle weakness (generalized)  Other muscle spasm  Unspecified lack of coordination  Rationale  for Evaluation and Treatment Rehabilitation  PERTINENT HISTORY: Breast cancer dx 05/22/20, fibromyalgia, anal fissure, G6P6  PRECAUTIONS: hx breast cancer  SUBJECTIVE:                                                                                                                                                                                      SUBJECTIVE STATEMENT:  Pt states that she woke up 4x last night to urinate. She is using urge suppression technique to help reduce the urgency/leaking on the way to the bathroom. She is also working on pelvic floor strengthening program. Her blood pressure medication makes her dizzy and she was instructed to split it up, but still take all of it.    PAIN:  Are you having pain? No   03/24/22 SUBJECTIVE STATEMENT: Pt states that when she has to go to the bathroom, she really has to go. She has a drive to [place] and is concerned. She does wear pessary, but has to take it out when she has bowel movement. Patient does states that she has breast pain - she is going to MD tomorrow. 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  Fluid intake: Yes: she limits fluid to some extent to stop urgency; she does drink 11 glasses of water a day and herbal tea     PAIN:  Are you having pain? Yes DDD NPRS scale: 1/10 Pain location:  low back   Pain type: aching Pain description: intermittent    Aggravating factors: not doing exercises Relieving factors: exercises   PRECAUTIONS: Other: previous cancer dx   WEIGHT BEARING RESTRICTIONS: No   FALLS:  Has patient fallen in last 6 months? No   LIVING ENVIRONMENT: Lives with: lives with their family Lives in: House/apartment     OCCUPATION: reitred   PLOF: Independent   PATIENT GOALS: improve urgency/leaking   PERTINENT HISTORY:  Breast cancer  dx 05/22/20, fibromyalgia, anal fissure, G6P6 Sexual abuse: No   BOWEL MOVEMENT: Pain with bowel movement: No Type of bowel movement:Frequency 2x/day, has  to take pessary out and Strain No Fully empty rectum: Yes: - Leakage: No Pads: No Fiber supplement: Yes: psyllium husk   URINATION: Pain with urination: No Fully empty bladder: Yes: double voids Stream: Strong Urgency: Yes: if she does not go right away she will leak Frequency: every hour and a half - two hours Leakage: Urge to void, Walking to the bathroom, Coughing, Sneezing, and Laughing Pads: Yes: small pad   INTERCOURSE: Pain with intercourse:  no pain Ability to have vaginal penetration:  Yes: - Climax: - Marinoff Scale: 0/3   PREGNANCY: Vaginal deliveries 6 Tearing Yes: episiotomy C-section deliveries 0 Currently pregnant No   PROLAPSE: Cystocele pressure     OBJECTIVE:    03/20/22:   COGNITION: Overall cognitive status: Within functional limits for tasks assessed                          SENSATION: Light touch: Appears intact other than tingling in bil LE Proprioception: Appears intact    MUSCLE LENGTH:     FUNCTIONAL TESTS:      GAIT:   Comments: WNL   POSTURE: rounded shoulders, forward head, decreased lumbar lordosis, and posterior pelvic tilt       LUMBARAROM/PROM:   A/PROM A/PROM  eval  Flexion    Extension    Right lateral flexion    Left lateral flexion    Right rotation    Left rotation     (Blank rows = not tested)       LOWER EXTREMITY MMT:     PALPATION:   General  abdominal tenderness at midline just inferior to strenum                 External Perineal Exam dryness/redness                             Internal Pelvic Floor WNL   Patient confirms identification and approves PT to assess internal pelvic floor and treatment Yes   PELVIC MMT:   MMT eval  Vaginal 1-2/5, varying coordination with notable abdominal bracing and LE contraction; 3 second endurance; 6 repeat contractions with poor motor control  Internal Anal Sphincter    External Anal Sphincter    Puborectalis    Diastasis Recti 1 finger width  separation with mild distortion   (Blank rows = not tested)         TONE: low   PROLAPSE: Grade 3 anterior vaginal wall laxity without pessary in   TODAY'S TREATMENT 05/13/21:  Neuromuscular re-education: Transversus abdominus training with multimodal cues for improved motor control and breath coordination Supine march 2 x 10 Exercises: Sidelying clam shell 2 x 10 bil Sidelying reverse clam shell 2 x 10 Open books 10x bil Bridge with hip adduction 2 x 10  Therapeutic activities: Encouraged to talk to MD about getting dizzy on blood pressure medication    TREATMENT 05/07/22 Neuromuscular re-education: Pelvic floor contractions in standing Regular stance Wide stance Tandem stance Seated hip adduction with ball 10x Seated hip abduction with blue band 10x Seated horizontal abduction with green band 10x   TREATMENT 04/28/22:    Manual Internal vaginal pelvic floor exam No emotional/communication barriers or cognitive limitation. Patient is motivated to learn. Patient understands and agrees with treatment  goals and plan. PT explains patient will be examined in standing, sitting, and lying down to see how their muscles and joints work. When they are ready, they will be asked to remove their underwear so PT can examine their perineum. The patient is also given the option of providing their own chaperone as one is not provided in our facility. The patient also has the right and is explained the right to defer or refuse any part of the evaluation or treatment including the internal exam. With the patient's consent, PT will use one gloved finger to gently assess the muscles of the pelvic floor, seeing how well it contracts and relaxes and if there is muscle symmetry. After, the patient will get dressed and PT and patient will discuss exam findings and plan of care. PT and patient discuss plan of care, schedule, attendance policy and HEP activities.  Neuromuscular re-education: Pelvic  floor contraction strengthening with breath coordination Quick flicks 79G Long holds 5 x 10 seconds Urge suppression flicks Therapeutic Activity: Bladder retraining Urge suppression technique                                                                                                     PATIENT EDUCATION:  Education details: see above self-care Person educated: Patient Education method: Explanation, Demonstration, Tactile cues, Verbal cues, and Handouts Education comprehension: verbalized understanding   HOME EXERCISE PROGRAM: [id number]   ASSESSMENT:   CLINICAL IMPRESSION: Pt overall doing well with decreased urgency and ability to make it to the bathroom without leaking. She did have 4 trips to the bathroom last night; believe working on bladder retraining more will be helpful. She did well with core and hip strengthening progressions with appropriate pressure management. We discussed how this strengthening will help to improve vaginal wall position and reduce symptoms of prolapse, hopefully decrease sensation of vaginal bulge that bothers her. She will continue to benefit from skilled PT intervention in order to improve pelvic floor strength and coordination, begin and progress pelvic floor/core functional strengthening program, and decrease urinary urgency/incontinence in order to improve QOL.    OBJECTIVE IMPAIRMENTS: decreased activity tolerance, decreased coordination, decreased endurance, decreased strength, increased fascial restrictions, increased muscle spasms, impaired tone, postural dysfunction, and pain.    ACTIVITY LIMITATIONS: continence   PARTICIPATION LIMITATIONS: community activity   PERSONAL FACTORS: 1-2 comorbidities: Breast cancer dx 05/22/20, fibromyalgia, anal fissure, G6P6  are also affecting patient's functional outcome.    REHAB POTENTIAL: Good   CLINICAL DECISION MAKING: Stable/uncomplicated   EVALUATION COMPLEXITY: Low     GOALS: Goals reviewed  with patient? Yes   SHORT TERM GOALS: Target date: 04/21/2022 - updated 04/28/22   Pt will be independent with HEP.    Baseline: Goal status: MET 04/28/22   2.  Pt will be independent with the knack, urge suppression technique, and double voiding in order to improve bladder habits and decrease urinary incontinence.    Baseline:  Goal status: IN PROGRESS   3.  Pt will be able to correctly perform diaphragmatic breathing and appropriate pressure management in order to prevent worsening  vaginal wall laxity and improve pelvic floor A/ROM.    Baseline:  Goal status: IN PROGRESS       LONG TERM GOALS: Target date: 05/19/2022 - updated 04/28/22   Pt will be independent with advanced HEP.    Baseline:  Goal status: IN PROGRESS   2.  Pt will be independent with the knack, urge suppression technique, and double voiding in order to improve bladder habits and decrease urinary incontinence.    Baseline:  Goal status: INITIALIN PROGRESS  3.  Pt will be able to go 2-3 hours in between voids without urgency or incontinence in order to improve QOL and perform all functional activities with less difficulty.    Baseline:  Goal status: IN PROGRESS   4.  Pt will be able to go on long car trips without having to stop frequently to urinate.  Baseline:  Goal status:IN PROGRESS   5. Pt will report no episodes of urinary incontinence in order to improve confidence in community activities and personal hygiene.    Baseline:  Goal status: IN PROGRESS       PLAN:   PT FREQUENCY: 1x/week   PT DURATION: 8 weeks   PLANNED INTERVENTIONS: Therapeutic exercises, Therapeutic activity, Neuromuscular re-education, Balance training, Gait training, Patient/Family education, Self Care, Joint mobilization, Dry Needling, Biofeedback, and Manual therapy   PLAN FOR NEXT SESSION: Progress core/hip strengthening to standing positions.    Heather Roberts, PT, DPT12/18/2310:43 AM  Heather Roberts, PT,  DPT01/02/243:29 PM

## 2022-05-14 DIAGNOSIS — M9902 Segmental and somatic dysfunction of thoracic region: Secondary | ICD-10-CM | POA: Diagnosis not present

## 2022-05-14 DIAGNOSIS — M531 Cervicobrachial syndrome: Secondary | ICD-10-CM | POA: Diagnosis not present

## 2022-05-14 DIAGNOSIS — M9903 Segmental and somatic dysfunction of lumbar region: Secondary | ICD-10-CM | POA: Diagnosis not present

## 2022-05-14 DIAGNOSIS — M9901 Segmental and somatic dysfunction of cervical region: Secondary | ICD-10-CM | POA: Diagnosis not present

## 2022-05-18 DIAGNOSIS — G4733 Obstructive sleep apnea (adult) (pediatric): Secondary | ICD-10-CM | POA: Diagnosis not present

## 2022-05-19 ENCOUNTER — Ambulatory Visit: Payer: Medicare HMO

## 2022-05-19 DIAGNOSIS — M6281 Muscle weakness (generalized): Secondary | ICD-10-CM

## 2022-05-19 DIAGNOSIS — M62838 Other muscle spasm: Secondary | ICD-10-CM | POA: Diagnosis not present

## 2022-05-19 DIAGNOSIS — R293 Abnormal posture: Secondary | ICD-10-CM

## 2022-05-19 DIAGNOSIS — Z483 Aftercare following surgery for neoplasm: Secondary | ICD-10-CM | POA: Diagnosis not present

## 2022-05-19 DIAGNOSIS — R279 Unspecified lack of coordination: Secondary | ICD-10-CM

## 2022-05-19 NOTE — Therapy (Signed)
OUTPATIENT PHYSICAL THERAPY TREATMENT NOTE/RE-EVALUATION   Patient Name: [name] MRN: [id number] DOB:04-02-1956, 67 y.o., female Today's Date: 05/19/2022  PCP: Burnis Medin, MD REFERRING PROVIDER: Vladimir Crofts, PA-C  END OF SESSION:   PT End of Session - 05/19/22 1230     Visit Number 5    Date for PT Re-Evaluation 07/28/22    Authorization Type [employer]    PT Start Time 1145    PT Stop Time 1230    PT Time Calculation (min) 45 min    Activity Tolerance Patient tolerated treatment well    Behavior During Therapy WFL for tasks assessed/performed               Past Medical History:  Diagnosis Date   Anal fissure    Atrial septal aneurysm / if pfo  echo 6 13  10/23/2011   Breast cancer ([place])    Cataract    both eyes   Depression    Fatty liver    "pre fatty liver"   Fibromyalgia    Headache(784.0)    hx of migraines when younger   Heart palpitations    hx with normal holter event monitoring   Hx: UTI (urinary tract infection)    Hyperlipidemia    Hypertension    Hypothyroidism    Obesity    Personal history of chemotherapy    Personal history of radiation therapy    Pneumonia 1972   hx of   Polyp of colon    Serrated adenoma of colon 08/2012   Skin cancer    basal, squamous cell   Sleep apnea    uses cpap   Past Surgical History:  Procedure Laterality Date   BREAST LUMPECTOMY     BREAST LUMPECTOMY WITH RADIOACTIVE SEED AND SENTINEL LYMPH NODE BIOPSY Right 07/26/2020   Procedure: RIGHT BREAST LUMPECTOMY WITH RADIOACTIVE SEED AND SENTINEL LYMPH NODE BIOPSY;  Surgeon: Jovita Kussmaul, MD;  Location: [facility];  Service: General;  Laterality: Right;   BREAST LUMPECTOMY WITH RADIOACTIVE SEED LOCALIZATION Left 07/26/2020   Procedure: LEFT BREAST LUMPECTOMY X 2  WITH RADIOACTIVE SEED LOCALIZATION;  Surgeon: Jovita Kussmaul, MD;  Location: [facility];  Service: General;  Laterality: Left;   COLONOSCOPY     DILATION AND CURETTAGE OF [place] ARTHROSCOPY     both in past   POLYPECTOMY     [place] Left 07/07/2013   Procedure: LEFT SHOULDER MINI OPEN SUBACROMIAL DECOMPRESSION ROTATOR CUFF REPAIR AND POSSIBLE PATCH GRAFT ;  Surgeon: Johnn Hai, MD;  Location: [facility];  Service: Orthopedics;  Laterality: Left;  with interscaline block   SKIN CANCER EXCISION Bilateral    arm, legs, and chest   TONSILLECTOMY     Patient Active Problem List   Diagnosis Date Noted   Malignant neoplasm of upper-outer quadrant of right breast in female, estrogen receptor positive ([place]) 06/25/2020   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   Hepatic steatosis 11/27/2014   Hx of adenomatous colonic polyps 11/27/2014   Visit for preventive health examination 03/28/2014   Hyperlipidemia 03/28/2014   Left rotator cuff tear 07/07/2013   Tear of left rotator cuff 07/07/2013   Rhinitis 06/01/2013   Chest wall deformity 04/12/2012   Atrial septal aneurysm / if pfo  echo 6 13  10/23/2011   OSA (obstructive sleep apnea) 11/27/2010   Dyslipidemia 11/01/2010  Flushing 08/29/2010   Labile hypertension 08/29/2010   MUSCLE CRAMPS, FOOT 06/10/2010   OTHER SLEEP DISTURBANCES 06/10/2010   ANXIETY, SITUATIONAL 05/08/2010   VERTIGO, POSITIONAL 03/15/2010   VITAMIN D DEFICIENCY 02/18/2010   OBESITY 02/18/2010   ALLERGIC RHINITIS 02/18/2010   PLANTAR FASCIITIS 02/18/2010   TWITCHING 06/11/2009   NUMBNESS, HAND 06/11/2009   CERVICAL STRAIN, ACUTE 06/11/2009   OTHER MALAISE AND FATIGUE 07/09/2007   HYPERTENSION 01/28/2007   Hypothyroidism 11/11/2006   COMMON MIGRAINE 11/11/2006   GERD 11/11/2006   FIBROMYALGIA 11/11/2006    REFERRING DIAG: K64.9 (ICD-10-CM) - Hemorrhoids, unspecified hemorrhoid type N81.4 (ICD-10-CM) - Cystocele with prolapse  THERAPY DIAG:  Abnormal posture  Muscle weakness (generalized)  Unspecified lack of coordination  Rationale for  Evaluation and Treatment Rehabilitation  PERTINENT HISTORY: Breast cancer dx 05/22/20, fibromyalgia, anal fissure, G6P6  PRECAUTIONS: hx breast cancer  SUBJECTIVE:                                                                                                                                                                                      SUBJECTIVE STATEMENT:  Pt is still waking up 3-4x a night even with stopping fluid intake early - she wonders if this is due to lying down and she is able to get fluid off. She continues to have trouble with urgency throughout the day. She feels like she is steadily making progress.    PAIN:  Are you having pain? No   03/24/22 SUBJECTIVE STATEMENT: Pt states that when she has to go to the bathroom, she really has to go. She has a drive to [place] and is concerned. She does wear pessary, but has to take it out when she has bowel movement. Patient does states that she has breast pain - she is going to MD tomorrow. 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  Fluid intake: Yes: she limits fluid to some extent to stop urgency; she does drink 11 glasses of water a day and herbal tea     PAIN:  Are you having pain? Yes DDD NPRS scale: 1/10 Pain location:  low back   Pain type: aching Pain description: intermittent    Aggravating factors: not doing exercises Relieving factors: exercises   PRECAUTIONS: Other: previous cancer dx   WEIGHT BEARING RESTRICTIONS: No   FALLS:  Has patient fallen in last 6 months? No   LIVING ENVIRONMENT: Lives with: lives with their family Lives in: House/apartment     OCCUPATION: reitred   PLOF: Independent   PATIENT GOALS: improve urgency/leaking   PERTINENT HISTORY:  Breast cancer dx 05/22/20, fibromyalgia, anal fissure, G6P6 Sexual abuse: No  BOWEL MOVEMENT: Pain with bowel movement: No Type of bowel movement:Frequency 2x/day, has to take pessary out and Strain No Fully empty rectum: Yes:  - Leakage: No Pads: No Fiber supplement: Yes: psyllium husk   URINATION: Pain with urination: No Fully empty bladder: Yes: double voids Stream: Strong Urgency: Yes: if she does not go right away she will leak Frequency: every hour and a half - two hours Leakage: Urge to void, Walking to the bathroom, Coughing, Sneezing, and Laughing Pads: Yes: small pad   INTERCOURSE: Pain with intercourse:  no pain Ability to have vaginal penetration:  Yes: - Climax: - Marinoff Scale: 0/3   PREGNANCY: Vaginal deliveries 6 Tearing Yes: episiotomy C-section deliveries 0 Currently pregnant No   PROLAPSE: Cystocele pressure     OBJECTIVE:   05/19/22: PALPATION:   General  abdominal tenderness at midline just inferior to strenum                 External Perineal Exam dryness/redness                             Internal Pelvic Floor WNL   Patient confirms identification and approves PT to assess internal pelvic floor and treatment Yes   PELVIC MMT:   MMT eval  Vaginal   Internal Anal Sphincter    External Anal Sphincter    Puborectalis    Diastasis Recti 1 finger width separation with mild distortion   (Blank rows = not tested)         TONE: low   PROLAPSE: Grade 3 anterior vaginal wall laxity without pessary in   03/20/22:  COGNITION: Overall cognitive status: Within functional limits for tasks assessed                          SENSATION: Light touch: Appears intact other than tingling in bil LE Proprioception: Appears intact    MUSCLE LENGTH:     FUNCTIONAL TESTS:      GAIT:   Comments: WNL   POSTURE: rounded shoulders, forward head, decreased lumbar lordosis, and posterior pelvic tilt       LUMBARAROM/PROM:   A/PROM A/PROM  eval  Flexion    Extension    Right lateral flexion    Left lateral flexion    Right rotation    Left rotation     (Blank rows = not tested)       LOWER EXTREMITY MMT:     PALPATION:   General  abdominal tenderness at  midline just inferior to strenum                 External Perineal Exam dryness/redness                             Internal Pelvic Floor WNL   Patient confirms identification and approves PT to assess internal pelvic floor and treatment Yes   PELVIC MMT:   MMT eval  Vaginal 1-2/5, varying coordination with notable abdominal bracing and LE contraction; 3 second endurance; 6 repeat contractions with poor motor control  Internal Anal Sphincter    External Anal Sphincter    Puborectalis    Diastasis Recti 1 finger width separation with mild distortion   (Blank rows = not tested)         TONE: low   PROLAPSE: Grade 3 anterior vaginal wall laxity without  pessary in   TODAY'S TREATMENT 05/19/21 RE-EVALUATION Manual: Pt provides verbal consent for internal vaginal/rectal pelvic floor exam Neuromuscular re-education: Standing slow march 2 x 10 Standing row 2 x 10 green band Standing shoulder extensions 2 x 10 green band Pallof press 10x bil green band  TREATMENT 05/13/21:  Neuromuscular re-education: Transversus abdominus training with multimodal cues for improved motor control and breath coordination Supine march 2 x 10 Exercises: Sidelying clam shell 2 x 10 bil Sidelying reverse clam shell 2 x 10 Open books 10x bil Bridge with hip adduction 2 x 10  Therapeutic activities: Encouraged to talk to MD about getting dizzy on blood pressure medication    TREATMENT 05/07/22 Neuromuscular re-education: Pelvic floor contractions in standing Regular stance Wide stance Tandem stance Seated hip adduction with ball 10x Seated hip abduction with blue band 10x Seated horizontal abduction with green band 10x   PATIENT EDUCATION:  Education details: see above self-care Person educated: Patient Education method: Explanation, Demonstration, Tactile cues, Verbal cues, and Handouts Education comprehension: verbalized understanding   HOME EXERCISE PROGRAM: [id number]   ASSESSMENT:    CLINICAL IMPRESSION: Pt overall is making great progress with less urgency and incontinence.  Pt is overall doing very well with urgency and decreased leaking. She feels like she is 25% better. She has the most difficulty with waking up at night. We discussed trying to take more breaks throughout the day to lie down to help get water out of legs. Pelvic floor strength improved to 2-3/5 with endurance of 5 seconds; her anterior vaginal wall laxity only appeared as grade 2 instead of 3 today. She tolerated progressions to standing core/pelvic floor strengthening very well with good breath coordination. Believe that addressing low back/hip pain will be important moving forward to help optimize pelvic floor strengthening/coordination. She will continue to benefit from skilled PT intervention in order to improve pelvic floor strength and coordination, begin and progress pelvic floor/core functional strengthening program, and decrease urinary urgency/incontinence in order to improve QOL.    OBJECTIVE IMPAIRMENTS: decreased activity tolerance, decreased coordination, decreased endurance, decreased strength, increased fascial restrictions, increased muscle spasms, impaired tone, postural dysfunction, and pain.    ACTIVITY LIMITATIONS: continence   PARTICIPATION LIMITATIONS: community activity   PERSONAL FACTORS: 1-2 comorbidities: Breast cancer dx 05/22/20, fibromyalgia, anal fissure, G6P6  are also affecting patient's functional outcome.    REHAB POTENTIAL: Good   CLINICAL DECISION MAKING: Stable/uncomplicated   EVALUATION COMPLEXITY: Low     GOALS: Goals reviewed with patient? Yes   SHORT TERM GOALS: Target date: 04/21/2022 - updated 04/28/22 - updated 05/19/21   Pt will be independent with HEP.    Baseline: Goal status: MET 04/28/22   2.  Pt will be independent with the knack, urge suppression technique, and double voiding in order to improve bladder habits and decrease urinary incontinence.     Baseline:  Goal status: MET 05/19/22   3.  Pt will be able to correctly perform diaphragmatic breathing and appropriate pressure management in order to prevent worsening vaginal wall laxity and improve pelvic floor A/ROM.    Baseline:  Goal status: MET 05/19/22       LONG TERM GOALS: Target date: 05/19/2022 - updated 04/28/22 - updated 05/19/21 new target 07/28/2022    Pt will be independent with advanced HEP.    Baseline:  Goal status: IN PROGRESS   2.  Pt will be independent with the knack, urge suppression technique, and double voiding in order to improve bladder habits and decrease  urinary incontinence.    Baseline:  Goal status: MET 05/19/22  3.  Pt will be able to go 2-3 hours in between voids without urgency or incontinence in order to improve QOL and perform all functional activities with less difficulty.    Baseline: most of the time she is getting to 2 hours, but sometimes leaking on the way to the bathroom Goal status: IN PROGRESS   4.  Pt will be able to go on long car trips without having to stop frequently to urinate.  Baseline: improving  Goal status:IN PROGRESS   5. Pt will report no episodes of urinary incontinence in order to improve confidence in community activities and personal hygiene.    Baseline:  Goal status: IN PROGRESS       PLAN:   PT FREQUENCY: 1x/week   PT DURATION: 8 weeks   PLANNED INTERVENTIONS: Therapeutic exercises, Therapeutic activity, Neuromuscular re-education, Balance training, Gait training, Patient/Family education, Self Care, Joint mobilization, Dry Needling, Biofeedback, and Manual therapy   PLAN FOR NEXT SESSION: Progress core/hip strengthening to standing positions.  Work on addressing back/bil hip pain to optimize ability to strengthen pelvic floor.    Heather Roberts, PT, DPT01/12/2410:44 PM

## 2022-05-29 ENCOUNTER — Telehealth: Payer: Medicare HMO | Admitting: Family Medicine

## 2022-06-02 ENCOUNTER — Ambulatory Visit: Payer: Medicare HMO

## 2022-06-02 VITALS — Wt 208.2 lb

## 2022-06-02 DIAGNOSIS — Z483 Aftercare following surgery for neoplasm: Secondary | ICD-10-CM

## 2022-06-02 NOTE — Therapy (Signed)
OUTPATIENT PHYSICAL THERAPY [facility]ZO SCREENING NOTE   Patient Name: [name] MRN: [id number] DOB:12-26-1955, 67 y.o., female Today's Date: 06/02/2022  PCP: Burnis Medin, MD REFERRING PROVIDER: Alla Feeling, NP   PT End of Session - 06/02/22 1636     Visit Number 5   # ucnhanged due to screen only   PT Start Time 1633    PT Stop Time 1638    PT Time Calculation (min) 5 min    Activity Tolerance Patient tolerated treatment well    Behavior During Therapy WFL for tasks assessed/performed             Past Medical History:  Diagnosis Date   Anal fissure    Atrial septal aneurysm / if pfo  echo 6 13  10/23/2011   Breast cancer ([place])    Cataract    both eyes   Depression    Fatty liver    "pre fatty liver"   Fibromyalgia    Headache(784.0)    hx of migraines when younger   Heart palpitations    hx with normal holter event monitoring   Hx: UTI (urinary tract infection)    Hyperlipidemia    Hypertension    Hypothyroidism    Obesity    Personal history of chemotherapy    Personal history of radiation therapy    Pneumonia 1972   hx of   Polyp of colon    Serrated adenoma of colon 08/2012   Skin cancer    basal, squamous cell   Sleep apnea    uses cpap   Past Surgical History:  Procedure Laterality Date   BREAST LUMPECTOMY     BREAST LUMPECTOMY WITH RADIOACTIVE SEED AND SENTINEL LYMPH NODE BIOPSY Right 07/26/2020   Procedure: RIGHT BREAST LUMPECTOMY WITH RADIOACTIVE SEED AND SENTINEL LYMPH NODE BIOPSY;  Surgeon: Jovita Kussmaul, MD;  Location: [facility];  Service: General;  Laterality: Right;   BREAST LUMPECTOMY WITH RADIOACTIVE SEED LOCALIZATION Left 07/26/2020   Procedure: LEFT BREAST LUMPECTOMY X 2  WITH RADIOACTIVE SEED LOCALIZATION;  Surgeon: Jovita Kussmaul, MD;  Location: [facility];  Service: General;  Laterality: Left;   COLONOSCOPY     DILATION AND CURETTAGE OF [place] ARTHROSCOPY     both in past    POLYPECTOMY     SHOULDER OPEN [place] Left 07/07/2013   Procedure: LEFT SHOULDER MINI OPEN SUBACROMIAL DECOMPRESSION ROTATOR CUFF REPAIR AND POSSIBLE PATCH GRAFT ;  Surgeon: Johnn Hai, MD;  Location[facility];  Service: Orthopedics;  Laterality: Left;  with interscaline block   SKIN CANCER EXCISION Bilateral    arm, legs, and chest   TONSILLECTOMY     Patient Active Problem List   Diagnosis Date Noted   Malignant neoplasm of upper-outer quadrant of right breast in female, estrogen receptor positive ([place]) 06/25/2020   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   Hepatic steatosis 11/27/2014   Hx of adenomatous colonic polyps 11/27/2014   Visit for preventive health examination 03/28/2014   Hyperlipidemia 03/28/2014   Left rotator cuff tear 07/07/2013   Tear of left rotator cuff 07/07/2013   Rhinitis 06/01/2013   Chest wall deformity 04/12/2012   Atrial septal aneurysm / if pfo  echo 6 13  10/23/2011   OSA (obstructive sleep apnea) 11/27/2010   Dyslipidemia 11/01/2010   Flushing 08/29/2010   Labile hypertension 08/29/2010   MUSCLE CRAMPS, FOOT 06/10/2010  OTHER SLEEP DISTURBANCES 06/10/2010   ANXIETY, SITUATIONAL 05/08/2010   VERTIGO, POSITIONAL 03/15/2010   VITAMIN D DEFICIENCY 02/18/2010   OBESITY 02/18/2010   ALLERGIC RHINITIS 02/18/2010   PLANTAR FASCIITIS 02/18/2010   TWITCHING 06/11/2009   NUMBNESS, HAND 06/11/2009   CERVICAL STRAIN, ACUTE 06/11/2009   OTHER MALAISE AND FATIGUE 07/09/2007   HYPERTENSION 01/28/2007   Hypothyroidism 11/11/2006   COMMON MIGRAINE 11/11/2006   GERD 11/11/2006   FIBROMYALGIA 11/11/2006    REFERRING DIAG: Rt breast cancer  THERAPY DIAG: Aftercare following surgery for neoplasm  PERTINENT HISTORY:   Patient was diagnosed on 05/22/2020 with 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 She underwent a left lumpectomy for DCIS and a right lumpectomy and sentinel node biopsy on 07/26/2020. She had  2 lymph nodes removed with 1 being positive for cancer on the right side. It is ER/PR positive. Left shoulder rotator cuff repair 06/2013. Last radiation on 01/15/21.  Seroma that was drained x 2-3.     PRECAUTIONS: Rt UE lymphedema risk  SUBJECTIVE: Pt returns for her 3 month L-Dex screen.   PAIN: Are you having pain? No  SOZO SCREENING: Patient was assessed today using the SOZO machine to determine the lymphedema index score. This was compared to her baseline score. It was determined that she is within the recommended range when compared to her baseline and no further action is needed at this time. She will return in 3 months for her next SOZO screen.  Plan: Pt will begin 6 month screens.   L-DEX FLOWSHEETS - 06/02/22 1600       L-DEX LYMPHEDEMA SCREENING   Measurement Type Unilateral    L-DEX MEASUREMENT EXTREMITY Upper Extremity    POSITION  Standing    DOMINANT SIDE Right    At Risk Side Right    BASELINE SCORE (UNILATERAL) 3.5    L-DEX SCORE (UNILATERAL) -0.6    VALUE CHANGE (UNILAT) -4.1              [name], PTA 06/02/22 4:37 PM

## 2022-06-11 ENCOUNTER — Ambulatory Visit
Admission: RE | Admit: 2022-06-11 | Discharge: 2022-06-11 | Disposition: A | Discharge status: Home / Self Care | Payer: Medicare HMO | Source: Ambulatory Visit | Attending: Internal Medicine | Admitting: Internal Medicine

## 2022-06-11 ENCOUNTER — Ambulatory Visit: Payer: Medicare HMO

## 2022-06-11 DIAGNOSIS — Z853 Personal history of malignant neoplasm of breast: Secondary | ICD-10-CM | POA: Diagnosis not present

## 2022-06-11 DIAGNOSIS — C779 Secondary and unspecified malignant neoplasm of lymph node, unspecified: Secondary | ICD-10-CM

## 2022-06-12 DIAGNOSIS — N959 Unspecified menopausal and perimenopausal disorder: Secondary | ICD-10-CM | POA: Diagnosis not present

## 2022-06-12 DIAGNOSIS — R7989 Other specified abnormal findings of blood chemistry: Secondary | ICD-10-CM | POA: Diagnosis not present

## 2022-06-12 DIAGNOSIS — C50919 Malignant neoplasm of unspecified site of unspecified female breast: Secondary | ICD-10-CM | POA: Diagnosis not present

## 2022-06-12 DIAGNOSIS — M9901 Segmental and somatic dysfunction of cervical region: Secondary | ICD-10-CM | POA: Diagnosis not present

## 2022-06-12 DIAGNOSIS — M9902 Segmental and somatic dysfunction of thoracic region: Secondary | ICD-10-CM | POA: Diagnosis not present

## 2022-06-12 DIAGNOSIS — E049 Nontoxic goiter, unspecified: Secondary | ICD-10-CM | POA: Diagnosis not present

## 2022-06-12 DIAGNOSIS — I10 Essential (primary) hypertension: Secondary | ICD-10-CM | POA: Diagnosis not present

## 2022-06-12 DIAGNOSIS — M9903 Segmental and somatic dysfunction of lumbar region: Secondary | ICD-10-CM | POA: Diagnosis not present

## 2022-06-12 DIAGNOSIS — K760 Fatty (change of) liver, not elsewhere classified: Secondary | ICD-10-CM | POA: Diagnosis not present

## 2022-06-12 DIAGNOSIS — M531 Cervicobrachial syndrome: Secondary | ICD-10-CM | POA: Diagnosis not present

## 2022-06-12 DIAGNOSIS — R5381 Other malaise: Secondary | ICD-10-CM | POA: Diagnosis not present

## 2022-06-12 DIAGNOSIS — E039 Hypothyroidism, unspecified: Secondary | ICD-10-CM | POA: Diagnosis not present

## 2022-06-12 DIAGNOSIS — E782 Mixed hyperlipidemia: Secondary | ICD-10-CM | POA: Diagnosis not present

## 2022-06-16 DIAGNOSIS — G603 Idiopathic progressive neuropathy: Secondary | ICD-10-CM | POA: Diagnosis not present

## 2022-06-16 DIAGNOSIS — M5412 Radiculopathy, cervical region: Secondary | ICD-10-CM | POA: Diagnosis not present

## 2022-06-16 NOTE — Progress Notes (Signed)
Care Management & Coordination Services Pharmacy Note  06/16/2022 Name:  [name] MRN:  [id number] DOB:  Oct 06, 1955  Summary: -Taking bp meds differently than written instructions (amlodipine 45m 1/2 tab in morning, 1/2 tab in evening; hctz 286mprn ankle swelling) -Reports well-controlled bp at home (checking infrequently though) and denies any hypo/hypertensive sx -Pt due for AWV and updated lipid panel, declines to sch today   Recommendations/Changes made from today's visit: -Needs to schedule AWV (pt declined to sch today) and get updated lipid panel -Recommend checking BP at home once weekly and keep a log  Follow up plan: HTN call in 2 months Pharmacist visit in 6 months   Subjective: [name] an [birth year]67 year old female who is a primary patient of Panosh, WaStandley BrookingMD.  The care coordination team was consulted for assistance with disease management and care coordination needs.    Engaged with patient by telephone for follow up visit.  Recent office visits: None  Recent consult visits: 06/02/22 VaMateo Flow. RoDebbe BalesFor PT  05/19/22; 05/13/22; 05/07/22; 04/28/22 Kristen A. StJanne NapoleonFor PT  05/13/22 KaJari PiggDermatology) - Routine exam 04/07/22 Miya Marrie McKnight (Endo) - For hypothyriodism. No med changes. Instructed to hold any supplements such as biotin at least 3 days prior to any TSH checks. 03/25/22 LaAlla FeelingNP (Oncology) - No medication changes   Hospital visits: None in previous 6 months.   Objective:  Lab Results  Component Value Date   CREATININE 0.68 03/25/2022   BUN 21 03/25/2022   GFR 86.85 12/30/2018   GFRNONAA >60 03/25/2022   GFRAA 95 01/09/2020   NA 140 03/25/2022   K 4.4 03/25/2022   CALCIUM 9.6 03/25/2022   CO2 31 03/25/2022   GLUCOSE 85 03/25/2022    Lab Results  Component Value Date/Time   HGBA1C 5.2 06/01/2013 04:45 PM   HGBA1C 5.4 12/27/2012 01:55 PM   GFR 86.85 12/30/2018 03:50 PM   GFR 76.89  07/02/2017 03:46 PM    Last diabetic Eye exam: No results found for: "HMDIABEYEEXA"  Last diabetic Foot exam: No results found for: "HMDIABFOOTEX"   Lab Results  Component Value Date   CHOL 226 (A) 06/05/2020   HDL 57 06/05/2020   LDLCALC 156 06/05/2020   LDLDIRECT 165.2 12/27/2012   TRIG 56 08/05/2019   CHOLHDL 2.9 04/12/2019       Latest Ref Rng & Units 03/25/2022    9:38 AM 12/23/2021   10:32 AM 10/22/2021    8:27 AM  Hepatic Function  Total Protein 6.5 - 8.1 g/dL 7.3  7.4  7.2   Albumin 3.5 - 5.0 g/dL 4.4  4.7  4.4   AST 15 - 41 U/L 25  19  32   ALT 0 - 44 U/L 52  29  46   Alk Phosphatase 38 - 126 U/L 128  113  104   Total Bilirubin 0.3 - 1.2 mg/dL 0.7  0.7  0.6     Lab Results  Component Value Date/Time   TSH 0.674 04/11/2021 01:40 PM   TSH 2.33 06/05/2020 12:00 AM   TSH 3.55 01/09/2020 12:00 AM   TSH 1.51 12/30/2018 03:50 PM       Latest Ref Rng & Units 03/25/2022    9:38 AM 12/23/2021   10:32 AM 10/23/2021   12:00 AM  CBC  WBC 4.0 - 10.5 K/uL 6.5  8.6  8.3      Hemoglobin 12.0 - 15.0 g/dL 13.4  13.7  13.9      Hematocrit 36.0 - 46.0 % 39.7  40.2  42      Platelets 150 - 400 K/uL 214  215  226         This result is from an external source.    Lab Results  Component Value Date/Time   VD25OH 51.73 10/22/2021 10:02 AM   VD25OH 48.14 04/11/2021 01:40 PM   VD25OH 42.89 12/30/2018 03:50 PM   VD25OH 37.07 08/10/2014 11:10 AM   VITAMINB12 2,539 (H) 04/11/2021 01:40 PM    Clinical ASCVD: No  The 10-year ASCVD risk score (Arnett DK, et al., 2019) is: 15.2%*   Values used to calculate the score:     Age: 67 years     Sex: Female     Is Non-Hispanic African American: No     Diabetic: No     Tobacco smoker: No     Systolic Blood Pressure: 99991111 mmHg     Is BP treated: Yes     HDL Cholesterol: 57 mg/dL*     Total Cholesterol: 226 mg/dL*     * - Cholesterol units were assumed for this score calculation    DEXA: 04/10/21 (Osteopenia) -2.1 Spine, -1.6 and  -1.7 in femoral neck     02/03/2022    2:05 PM 05/28/2021   11:26 AM 06/11/2020    1:38 PM  Depression screen PHQ 2/9  Decreased Interest 0 0 0  Down, Depressed, Hopeless 0 0 0  PHQ - 2 Score 0 0 0  Altered sleeping 1  0  Tired, decreased energy 1  0  Change in appetite 0  0  Feeling bad or failure about yourself  0  0  Trouble concentrating 0  0  Moving slowly or fidgety/restless 0  0  Suicidal thoughts 0  0  PHQ-9 Score 2  0  Difficult doing work/chores Not difficult at all  Not difficult at all     Social History   Tobacco Use  Smoking Status Never  Smokeless Tobacco Never   BP Readings from Last 3 Encounters:  03/25/22 (!) 153/76  03/24/22 137/74  03/10/22 (!) 148/76   Pulse Readings from Last 3 Encounters:  03/25/22 81  03/24/22 86  03/10/22 72   Wt Readings from Last 3 Encounters:  06/02/22 208 lb 4 oz (94.5 kg)  03/25/22 201 lb (91.2 kg)  03/24/22 202 lb (91.6 kg)   BMI Readings from Last 3 Encounters:  06/02/22 34.13 kg/m  03/25/22 32.94 kg/m  03/24/22 33.10 kg/m    Allergies  Allergen Reactions   Penicillins Itching, Swelling and Other (See Comments)    REACTION: swelling up as an adult-67 yrs old REACTION: swelling up as a child   Taxotere [Docetaxel] Anaphylaxis    08/30/20 Constricted throat sensation of throat and tongue swelling and chest pressure Docetaxel paused and given Pepcid 20 mg IV, Benadryl 25 mg IV, Ativan 0.5 mg IV x1.  Ativan 0.5 mg IV x1 was repeated after the restart of the patient's chemotherapy. The patient's symptoms abated and docetaxel restarted.  Then had a recurrence of constriction of throat and was given Ativan 0.5 mg IV x1 for total dose of 1 mg of Ativan.  She was able to complete her treatment with out any further issues or concerns.   Cefdinir Swelling, Itching and Rash    REACTION: Rash, swelling and itching   Norvasc [Amlodipine Besylate] Other (See Comments)    Fatigue and dizziness   Cetirizine  Other (See  Comments)    unsure   Femara [Letrozole] Other (See Comments)    Pt reports severe muscle cramp and exhaustion.    Irbesartan Other (See Comments)    See 1 20 note  Heart burn , dizziness when taken with diuretic  See 1 20 note  Heart burn , dizziness when taken with diuretic    Molds & Smuts     Mold and mildew   Pegfilgrastim     Bad reaction- unspecified    Tylenol [Acetaminophen]     Pre fatty liver: does not takes these    Medications Reviewed Today     Reviewed by Jule Economy, PT (Physical Therapist) on 05/19/22 at 1244  Med List Status: <None>   Medication Order Taking? Sig Documenting Provider Last Dose Status Informant  amLODipine (NORVASC) 10 MG tablet [id number] No Take 10 mg by mouth daily. [provider] Taking Active   ARMOUR THYROID PO [id number] No  [provider] Taking Active   Ascorbic Acid (VITAMIN C) 1000 MG tablet [id number] No Take 1,000 mg by mouth daily. [provider] Taking Active Self  b complex vitamins capsule [id number] No Take 1 capsule by mouth daily. [provider] Taking Active Self  Bioflavonoid Products (QUERCETIN COMPLEX IMMUNE PO) [id number] No Take 1 capsule by mouth as needed. [provider] Taking Active Self  Cholecalciferol (VITAMIN D) 50 MCG (2000 UT) tablet [id number] No Take 4,000 Units by mouth daily. [provider] Taking Active Self  doxycycline (VIBRA-TABS) 100 MG tablet [id number]  Take 1 tablet (100 mg total) by mouth 2 (two) times daily. If needed for sinusitis Panosh, Standley Brooking, MD  Active   hydrochlorothiazide (HYDRODIURIL) 25 MG tablet [id number] No Take 12.5 mg by mouth daily.  Patient not taking: Reported on 02/28/2022   [provider] Not Taking Active   hydrocortisone (ANUSOL-HC) 2.5 % rectal cream [id number] No Place 1 Application rectally 2 (two) times daily.  Patient not taking: Reported on 03/10/2022   Delfina Redwood Not Taking Active   MAGNESIUM  CARBONATE PO [id number] No Take 1 tablet by mouth daily. [provider] Taking Active Self  melatonin 5 MG TABS [id number] No Take 5 mg by mouth at bedtime. [provider] Taking Active Self  Menaquinone-7 (VITAMIN K2) 100 MCG CAPS [id number] No Take 100 mcg by mouth daily. [provider] Taking Active Self  Multiple Vitamin (MULTIVITAMIN) tablet [id number] No Take 1 tablet by mouth daily. [provider] Taking Active Self  OVER THE COUNTER MEDICATION [id number] No Take 2 capsules by mouth in the morning and at bedtime. Infla Med [provider] Taking Active Self  OVER THE COUNTER MEDICATION [id number] No Take 1 capsule by mouth daily. Essential Pro [provider] Taking Active Self  potassium chloride SA (KLOR-CON M) 20 MEQ tablet [id number]  TAKE ONE TABLET BY MOUTH ONE TIME DAILY Panosh, Standley Brooking, MD  Active   Probiotic Product (PROBIOTIC PO) [id number] No Take 1 capsule by mouth daily. [provider] Taking Active Self  UBIQUINOL PO [id number] No Take by mouth. [provider] Taking Active   ZINC GLUCONATE PO [id number] No Take 10 mg by mouth daily. [provider] Taking Active Self            SDOH:  (Social Determinants of Health) assessments and interventions performed: No SDOH Interventions    Flowsheet Row Chronic Care Management from 07/24/2021 in [facility]  from 06/11/2020 in [facility] at [facility] Interventions -- Intervention Not Indicated  Housing Interventions -- Intervention Not Indicated  Transportation Interventions Intervention Not Indicated Intervention Not Indicated  Depression Interventions/Treatment  -- PHQ2-9 Score <4 Follow-up Not Indicated  Financial Strain Interventions Intervention Not Indicated Intervention Not Indicated  Physical Activity Interventions -- Intervention Not  Indicated  Stress Interventions -- Intervention Not Indicated  Social Connections Interventions -- Intervention Not Indicated       Medication Assistance: None required.  Patient affirms current coverage meets needs.  Medication Access: Within the past 30 days, how often has patient missed a dose of medication? None Is a pillbox or other method used to improve adherence? Yes  Factors that may affect medication adherence? nonadherence to medications Are meds synced by current pharmacy? No  Are meds delivered by current pharmacy? No  Does patient experience delays in picking up medications due to transportation concerns? No   [facility] Reviewed: Is patient disadvantaged to use [facility]?: Yes  Current Rx insurance plan: [employer] Name and location of Current pharmacy:  [facility] # [place], [place] [address] Phone: [phone] Fax: [phone]  [facility] [place], [place] [id number] [place] [address] Phone: [phone] Fax: [place] [address], [place] [address] [place] [address] Phone: [phone] Fax: [phone]  [facility] services reviewed with patient today?: No  Patient requests to transfer care to [facility]?: No  Reason patient declined to change pharmacies: Disadvantaged due to insurance/mail order  Compliance/Adherence/Medication fill history: Care Gaps: AWV (was scheduled 1/18 but canc) Vaccines: COVID, PNA, Shingles  Star-Rating Drugs: None   Assessment/Plan   Hypertension (BP goal <130/80) -Not ideally controlled -Current treatment: HCTZ 62m 1 tab daily (taking prn for swelling) Appropriate, Effective, Safe, Accessible Amlodipine 135m1/2 tab in morning and 1/2 tab qpm Appropriate, Effective, Safe, Accessible -Medications  previously tried: Carvedilol, Irbesartan, Valsartan, Verapamil -Current home readings: 118/79 -Current dietary habits: not discussed -Current exercise habits: not discussed -Denies hypotensive/hypertensive symptoms -Educated on BP goals and benefits of medications for prevention of heart attack, stroke and kidney damage; Daily salt intake goal < 2300 mg; Importance of home blood pressure monitoring; -Counseled to monitor BP at home at least once weekly, document, and provide log at future appointments -Recommended to continue current medication  Hyperlipidemia: (LDL goal < 100) -Uncontrolled -Current treatment: None -Medications previously tried: None  -Current dietary patterns: pt declined to discuss -Current exercise habits: pt declined to discuss -Educated on Cholesterol goals;  -[phone]

## 2022-06-19 ENCOUNTER — Telehealth: Payer: Self-pay

## 2022-06-19 ENCOUNTER — Ambulatory Visit: Payer: Medicare HMO | Attending: Nurse Practitioner

## 2022-06-19 DIAGNOSIS — M62838 Other muscle spasm: Secondary | ICD-10-CM | POA: Diagnosis not present

## 2022-06-19 DIAGNOSIS — R279 Unspecified lack of coordination: Secondary | ICD-10-CM | POA: Insufficient documentation

## 2022-06-19 DIAGNOSIS — M6281 Muscle weakness (generalized): Secondary | ICD-10-CM | POA: Diagnosis not present

## 2022-06-19 DIAGNOSIS — R293 Abnormal posture: Secondary | ICD-10-CM | POA: Insufficient documentation

## 2022-06-19 NOTE — Patient Instructions (Signed)

## 2022-06-19 NOTE — Progress Notes (Signed)
Patient ID: [name], female   DOB: 20-Mar-1956, 67 y.o.   MRN: [id number]  Care Management & Coordination Services Pharmacy Team  Reason for Encounter: Appointment Reminder  Contacted patient to confirm telephone appointment with [name] PharmD on 06/20/22 at 10. Spoke with patient on 06/19/2022   Do you have any problems getting your medications? No    Star Rating Drugs:  None   Care Gaps: COVID Booster - Overdue PNA Vaccine - Overdue [place] Vaccine - Overdue AWV- 05/28/21 Flu Vaccine - [place] Clinical Pharmacist Assistant [phone]

## 2022-06-19 NOTE — Therapy (Signed)
OUTPATIENT PHYSICAL THERAPY TREATMENT NOTE/RE-EVALUATION   Patient Name: [name] MRN: [id number] DOB:1955/10/29, 67 y.o., female Today's Date: 06/19/2022  PCP: Burnis Medin, MD REFERRING PROVIDER: Vladimir Crofts, PA-C  END OF SESSION:   PT End of Session - 06/19/22 0846     Visit Number 6    Date for PT Re-Evaluation 07/28/22    Authorization Type [employer]    PT Start Time 0845    PT Stop Time 0930    PT Time Calculation (min) 45 min    Activity Tolerance Patient tolerated treatment well    Behavior During Therapy [facility] for tasks assessed/performed               Past Medical History:  Diagnosis Date   Anal fissure    Atrial septal aneurysm / if pfo  echo 6 13  10/23/2011   Breast cancer ([place])    Cataract    both eyes   Depression    Fatty liver    "pre fatty liver"   Fibromyalgia    Headache(784.0)    hx of migraines when younger   Heart palpitations    hx with normal holter event monitoring   Hx: UTI (urinary tract infection)    Hyperlipidemia    Hypertension    Hypothyroidism    Obesity    Personal history of chemotherapy    Personal history of radiation therapy    Pneumonia 1972   hx of   Polyp of colon    Serrated adenoma of colon 08/2012   Skin cancer    basal, squamous cell   Sleep apnea    uses cpap   Past Surgical History:  Procedure Laterality Date   BREAST LUMPECTOMY     BREAST LUMPECTOMY WITH RADIOACTIVE SEED AND SENTINEL LYMPH NODE BIOPSY Right 07/26/2020   Procedure: RIGHT BREAST LUMPECTOMY WITH RADIOACTIVE SEED AND SENTINEL LYMPH NODE BIOPSY;  Surgeon: Jovita Kussmaul, MD;  Location: [facility];  Service: General;  Laterality: Right;   BREAST LUMPECTOMY WITH RADIOACTIVE SEED LOCALIZATION Left 07/26/2020   Procedure: LEFT BREAST LUMPECTOMY X 2  WITH RADIOACTIVE SEED LOCALIZATION;  Surgeon: Jovita Kussmaul, MD;  Location: [facility];  Service: General;  Laterality: Left;   COLONOSCOPY     DILATION AND CURETTAGE OF [place] ARTHROSCOPY     both in past   POLYPECTOMY     [place] Left 07/07/2013   Procedure: LEFT SHOULDER MINI OPEN SUBACROMIAL DECOMPRESSION ROTATOR CUFF REPAIR AND POSSIBLE PATCH GRAFT ;  Surgeon: Johnn Hai, MD;  Location: [facility];  Service: Orthopedics;  Laterality: Left;  with interscaline block   SKIN CANCER EXCISION Bilateral    arm, legs, and chest   TONSILLECTOMY     Patient Active Problem List   Diagnosis Date Noted   Malignant neoplasm of upper-outer quadrant of right breast in female, estrogen receptor positive ([place]) 06/25/2020   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   Hepatic steatosis 11/27/2014   Hx of adenomatous colonic polyps 11/27/2014   Visit for preventive health examination 03/28/2014   Hyperlipidemia 03/28/2014   Left rotator cuff tear 07/07/2013   Tear of left rotator cuff 07/07/2013   Rhinitis 06/01/2013   Chest wall deformity 04/12/2012   Atrial septal aneurysm / if pfo  echo 6 13  10/23/2011   OSA (obstructive sleep apnea) 11/27/2010   Dyslipidemia 11/01/2010  [place] 08/29/2010   Labile hypertension 08/29/2010   MUSCLE CRAMPS, FOOT 06/10/2010   OTHER SLEEP DISTURBANCES 06/10/2010   ANXIETY, SITUATIONAL 05/08/2010   VERTIGO, POSITIONAL 03/15/2010   VITAMIN D DEFICIENCY 02/18/2010   OBESITY 02/18/2010   ALLERGIC RHINITIS 02/18/2010   PLANTAR FASCIITIS 02/18/2010   TWITCHING 06/11/2009   NUMBNESS, HAND 06/11/2009   CERVICAL STRAIN, ACUTE 06/11/2009   OTHER MALAISE AND FATIGUE 07/09/2007   HYPERTENSION 01/28/2007   Hypothyroidism 11/11/2006   COMMON MIGRAINE 11/11/2006   GERD 11/11/2006   FIBROMYALGIA 11/11/2006    REFERRING DIAG: K64.9 (ICD-10-CM) - Hemorrhoids, unspecified hemorrhoid type N81.4 (ICD-10-CM) - Cystocele with prolapse  THERAPY DIAG:  Abnormal posture  Muscle weakness (generalized)  Other muscle spasm  Unspecified lack of  coordination  Rationale for Evaluation and Treatment Rehabilitation  PERTINENT HISTORY: Breast cancer dx 05/22/20, fibromyalgia, anal fissure, G6P6  PRECAUTIONS: hx breast cancer  SUBJECTIVE:                                                                                                                                                                                      SUBJECTIVE STATEMENT: Pt states that she has only been working on pelvic floor contractions, but no other exercises. She feels like her nerves are raw due to life stress. She is only waking up 1-2x/night recently - she is working very hard on not drinking before bed. She is working hard on urge drill. She states that low back was feeling a little better, but then she picked up some heavy crates.   PAIN:  Are you having pain? No   03/24/22 SUBJECTIVE STATEMENT: Pt states that when she has to go to the bathroom, she really has to go. She has a drive to [place] and is concerned. She does wear pessary, but has to take it out when she has bowel movement. Patient does states that she has breast pain - she is going to MD tomorrow. 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  Fluid intake: Yes: she limits fluid to some extent to stop urgency; she does drink 11 glasses of water a day and herbal tea     PAIN:  Are you having pain? Yes DDD NPRS scale: 1/10 Pain location:  low back   Pain type: aching Pain description: intermittent    Aggravating factors: not doing exercises Relieving factors: exercises   PRECAUTIONS: Other: previous cancer dx   WEIGHT BEARING RESTRICTIONS: No   FALLS:  Has patient fallen in last 6 months? No   LIVING ENVIRONMENT: Lives with: lives with their family Lives in: House/apartment     OCCUPATION: reitred   PLOF: Independent  PATIENT GOALS: improve urgency/leaking   PERTINENT HISTORY:  Breast cancer dx 05/22/20, fibromyalgia, anal fissure, G6P6 Sexual abuse: No   BOWEL  MOVEMENT: Pain with bowel movement: No Type of bowel movement:Frequency 2x/day, has to take pessary out and Strain No Fully empty rectum: Yes: - Leakage: No Pads: No Fiber supplement: Yes: psyllium husk   URINATION: Pain with urination: No Fully empty bladder: Yes: double voids Stream: Strong Urgency: Yes: if she does not go right away she will leak Frequency: every hour and a half - two hours Leakage: Urge to void, Walking to the bathroom, Coughing, Sneezing, and Laughing Pads: Yes: small pad   INTERCOURSE: Pain with intercourse:  no pain Ability to have vaginal penetration:  Yes: - Climax: - Marinoff Scale: 0/3   PREGNANCY: Vaginal deliveries 6 Tearing Yes: episiotomy C-section deliveries 0 Currently pregnant No   PROLAPSE: Cystocele pressure     OBJECTIVE:   05/19/22: PALPATION:   General  abdominal tenderness at midline just inferior to strenum                 External Perineal Exam dryness/redness                             Internal Pelvic Floor WNL   Patient confirms identification and approves PT to assess internal pelvic floor and treatment Yes   PELVIC MMT:   MMT eval  Vaginal   Internal Anal Sphincter    External Anal Sphincter    Puborectalis    Diastasis Recti 1 finger width separation with mild distortion   (Blank rows = not tested)         TONE: low   PROLAPSE: Grade 3 anterior vaginal wall laxity without pessary in   03/20/22:  COGNITION: Overall cognitive status: Within functional limits for tasks assessed                          SENSATION: Light touch: Appears intact other than tingling in bil LE Proprioception: Appears intact    MUSCLE LENGTH:     FUNCTIONAL TESTS:      GAIT:   Comments: WNL   POSTURE: rounded shoulders, forward head, decreased lumbar lordosis, and posterior pelvic tilt       LUMBARAROM/PROM:   A/PROM A/PROM  eval  Flexion    Extension    Right lateral flexion    Left lateral flexion    Right  rotation    Left rotation     (Blank rows = not tested)       LOWER EXTREMITY MMT:     PALPATION:   General  abdominal tenderness at midline just inferior to strenum                 External Perineal Exam dryness/redness                             Internal Pelvic Floor WNL   Patient confirms identification and approves PT to assess internal pelvic floor and treatment Yes   PELVIC MMT:   MMT eval  Vaginal 1-2/5, varying coordination with notable abdominal bracing and LE contraction; 3 second endurance; 6 repeat contractions with poor motor control  Internal Anal Sphincter    External Anal Sphincter    Puborectalis    Diastasis Recti 1 finger width separation with mild distortion   (Blank rows =  not tested)         TONE: low   PROLAPSE: Grade 3 anterior vaginal wall laxity without pessary in   TODAY'S TREATMENT 06/19/22 Manual: Trigger Point Dry-Needling  Treatment instructions: Expect mild to moderate muscle soreness. S/S of pneumothorax if dry needled over a lung field, and to seek immediate medical attention should they occur. Patient verbalized understanding of these instructions and education.  Patient Consent Given: Yes Education handout provided: Yes Muscles treated: L4-5 lumbar paraspinals, bil glutes Electrical stimulation performed: No Parameters: N/A Treatment response/outcome: twitch response and release Negative pressure soft tissue mobilization to bil lumbar paraspinals Soft tissue mobilization to bil lumbar paraspinals Neuromuscular re-education: Seated hip abduction yellow loop 10x Seated hip adduction ball squeeze 10x Seated hip IR yellow loop 10x    TREATMENT 05/19/21 RE-EVALUATION Manual: Pt provides verbal consent for internal vaginal/rectal pelvic floor exam Neuromuscular re-education: Standing slow march 2 x 10 Standing row 2 x 10 green band Standing shoulder extensions 2 x 10 green band Pallof press 10x bil green band  TREATMENT  05/13/21:  Neuromuscular re-education: Transversus abdominus training with multimodal cues for improved motor control and breath coordination Supine march 2 x 10 Exercises: Sidelying clam shell 2 x 10 bil Sidelying reverse clam shell 2 x 10 Open books 10x bil Bridge with hip adduction 2 x 10  Therapeutic activities: Encouraged to talk to MD about getting dizzy on blood pressure medication    PATIENT EDUCATION:  Education details: see above self-care Person educated: Patient Education method: Explanation, Demonstration, Tactile cues, Verbal cues, and Handouts Education comprehension: verbalized understanding   HOME EXERCISE PROGRAM: [id number]   ASSESSMENT:   CLINICAL IMPRESSION: Pt is seeing some good progress demonstrated by only waking 1-2x/night instead of 3-4x/night. She has been working on pelvic floor specific training, but no other exercises. Low back and hips addressed today due to large amount of pain and restriction that is impacting pelvic floor/core function. She tolerated review of gentle hip/core/pelvic floor strengthening well with no increase in pain. Overall reduction in discomfort reported at end of session. She will continue to benefit from skilled PT intervention in order to improve pelvic floor strength and coordination, begin and progress pelvic floor/core functional strengthening program, and decrease urinary urgency/incontinence in order to improve QOL.    OBJECTIVE IMPAIRMENTS: decreased activity tolerance, decreased coordination, decreased endurance, decreased strength, increased fascial restrictions, increased muscle spasms, impaired tone, postural dysfunction, and pain.    ACTIVITY LIMITATIONS: continence   PARTICIPATION LIMITATIONS: community activity   PERSONAL FACTORS: 1-2 comorbidities: Breast cancer dx 05/22/20, fibromyalgia, anal fissure, G6P6  are also affecting patient's functional outcome.    REHAB POTENTIAL: Good   CLINICAL DECISION MAKING:  Stable/uncomplicated   EVALUATION COMPLEXITY: Low     GOALS: Goals reviewed with patient? Yes   SHORT TERM GOALS: Target date: 04/21/2022 - updated 04/28/22 - updated 05/19/21   Pt will be independent with HEP.    Baseline: Goal status: MET 04/28/22   2.  Pt will be independent with the knack, urge suppression technique, and double voiding in order to improve bladder habits and decrease urinary incontinence.    Baseline:  Goal status: MET 05/19/22   3.  Pt will be able to correctly perform diaphragmatic breathing and appropriate pressure management in order to prevent worsening vaginal wall laxity and improve pelvic floor A/ROM.    Baseline:  Goal status: MET 05/19/22       LONG TERM GOALS: Target date: 05/19/2022 - updated 04/28/22 - updated  05/19/21 new target 07/28/2022    Pt will be independent with advanced HEP.    Baseline:  Goal status: IN PROGRESS   2.  Pt will be independent with the knack, urge suppression technique, and double voiding in order to improve bladder habits and decrease urinary incontinence.    Baseline:  Goal status: MET 05/19/22  3.  Pt will be able to go 2-3 hours in between voids without urgency or incontinence in order to improve QOL and perform all functional activities with less difficulty.    Baseline: most of the time she is getting to 2 hours, but sometimes leaking on the way to the bathroom Goal status: IN PROGRESS   4.  Pt will be able to go on long car trips without having to stop frequently to urinate.  Baseline: improving  Goal status:IN PROGRESS   5. Pt will report no episodes of urinary incontinence in order to improve confidence in community activities and personal hygiene.    Baseline:  Goal status: IN PROGRESS       PLAN:   PT FREQUENCY: 1x/week   PT DURATION: 8 weeks   PLANNED INTERVENTIONS: Therapeutic exercises, Therapeutic activity, Neuromuscular re-education, Balance training, Gait training, Patient/Family education, Self  Care, Joint mobilization, Dry Needling, Biofeedback, and Manual therapy   PLAN FOR NEXT SESSION: Progress core/hip strengthening to standing positions.  Work on addressing back/bil hip pain to optimize ability to strengthen pelvic floor.    Heather Roberts, PT, DPT02/08/249:25 AM

## 2022-06-20 ENCOUNTER — Ambulatory Visit: Payer: Medicare HMO

## 2022-06-24 ENCOUNTER — Ambulatory Visit: Payer: Medicare HMO

## 2022-06-24 DIAGNOSIS — M6281 Muscle weakness (generalized): Secondary | ICD-10-CM

## 2022-06-24 DIAGNOSIS — R279 Unspecified lack of coordination: Secondary | ICD-10-CM

## 2022-06-24 DIAGNOSIS — M62838 Other muscle spasm: Secondary | ICD-10-CM

## 2022-06-24 DIAGNOSIS — R293 Abnormal posture: Secondary | ICD-10-CM | POA: Diagnosis not present

## 2022-06-24 NOTE — Therapy (Signed)
OUTPATIENT PHYSICAL THERAPY TREATMENT NOTE/RE-EVALUATION   Patient Name: ROBBIN MONTAGNA MRN: [id number] DOB:1955/09/19, 67 y.o., female Today's Date: 06/24/2022  PCP: Burnis Medin, MD REFERRING PROVIDER: Vladimir Crofts, PA-C  END OF SESSION:   PT End of Session - 06/24/22 1149     Visit Number 7    Date for PT Re-Evaluation 07/28/22    Authorization Type [employer]    PT Start Time 1145    PT Stop Time 1225    PT Time Calculation (min) 40 min    Activity Tolerance Patient tolerated treatment well    Behavior During Therapy WFL for tasks assessed/performed                Past Medical History:  Diagnosis Date   Anal fissure    Atrial septal aneurysm / if pfo  echo 6 13  10/23/2011   Breast cancer ([place])    Cataract    both eyes   Depression    Fatty liver    "pre fatty liver"   Fibromyalgia    Headache(784.0)    hx of migraines when younger   Heart palpitations    hx with normal holter event monitoring   Hx: UTI (urinary tract infection)    Hyperlipidemia    Hypertension    Hypothyroidism    Obesity    Personal history of chemotherapy    Personal history of radiation therapy    Pneumonia 1972   hx of   Polyp of colon    Serrated adenoma of colon 08/2012   Skin cancer    basal, squamous cell   Sleep apnea    uses cpap   Past Surgical History:  Procedure Laterality Date   BREAST LUMPECTOMY     BREAST LUMPECTOMY WITH RADIOACTIVE SEED AND SENTINEL LYMPH NODE BIOPSY Right 07/26/2020   Procedure: RIGHT BREAST LUMPECTOMY WITH RADIOACTIVE SEED AND SENTINEL LYMPH NODE BIOPSY;  Surgeon: Jovita Kussmaul, MD;  Location: [facility];  Service: General;  Laterality: Right;   BREAST LUMPECTOMY WITH RADIOACTIVE SEED LOCALIZATION Left 07/26/2020   Procedure: LEFT BREAST LUMPECTOMY X 2  WITH RADIOACTIVE SEED LOCALIZATION;  Surgeon: Jovita Kussmaul, MD;  Location: [facility];  Service: General;  Laterality: Left;   COLONOSCOPY     DILATION AND CURETTAGE OF [place] ARTHROSCOPY     both in past   POLYPECTOMY     [place] Left 07/07/2013   Procedure: LEFT SHOULDER MINI OPEN SUBACROMIAL DECOMPRESSION ROTATOR CUFF REPAIR AND POSSIBLE PATCH GRAFT ;  Surgeon: Johnn Hai, MD;  Location: [facility];  Service: Orthopedics;  Laterality: Left;  with interscaline block   SKIN CANCER EXCISION Bilateral    arm, legs, and chest   TONSILLECTOMY     Patient Active Problem List   Diagnosis Date Noted   Malignant neoplasm of upper-outer quadrant of right breast in female, estrogen receptor positive ([place]) 06/25/2020   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   Hepatic steatosis 11/27/2014   Hx of adenomatous colonic polyps 11/27/2014   Visit for preventive health examination 03/28/2014   Hyperlipidemia 03/28/2014   Left rotator cuff tear 07/07/2013   Tear of left rotator cuff 07/07/2013   Rhinitis 06/01/2013   Chest wall deformity 04/12/2012   Atrial septal aneurysm / if pfo  echo 6 13  10/23/2011   OSA (obstructive sleep apnea) 11/27/2010   Dyslipidemia 11/01/2010  [place] 08/29/2010   Labile hypertension 08/29/2010   MUSCLE CRAMPS, FOOT 06/10/2010   OTHER SLEEP DISTURBANCES 06/10/2010   ANXIETY, SITUATIONAL 05/08/2010   VERTIGO, POSITIONAL 03/15/2010   VITAMIN D DEFICIENCY 02/18/2010   OBESITY 02/18/2010   ALLERGIC RHINITIS 02/18/2010   PLANTAR FASCIITIS 02/18/2010   TWITCHING 06/11/2009   NUMBNESS, HAND 06/11/2009   CERVICAL STRAIN, ACUTE 06/11/2009   OTHER MALAISE AND FATIGUE 07/09/2007   HYPERTENSION 01/28/2007   Hypothyroidism 11/11/2006   COMMON MIGRAINE 11/11/2006   GERD 11/11/2006   FIBROMYALGIA 11/11/2006    REFERRING DIAG: K64.9 (ICD-10-CM) - Hemorrhoids, unspecified hemorrhoid type N81.4 (ICD-10-CM) - Cystocele with prolapse  THERAPY DIAG:  Abnormal posture  Muscle weakness (generalized)  Other muscle spasm  Unspecified lack of  coordination  Rationale for Evaluation and Treatment Rehabilitation  PERTINENT HISTORY: Breast cancer dx 05/22/20, fibromyalgia, anal fissure, G6P6  PRECAUTIONS: hx breast cancer  SUBJECTIVE:                                                                                                                                                                                      SUBJECTIVE STATEMENT: Pt reports that she is seeing improvements with urinary urgency and is not going to the bathroom as often. Pt states that she's very sore in her posterior hips and it radiates down to her knees. She feels this soreness more when she is doing the band exercises.    PAIN:  Are you having pain? No and Yes: NPRS scale: 3/10 Pain location: low back, bil posterior hips Pain description: uncomfortable Aggravating factors: prolonged standing, lifting, exercises Relieving factors: exercises, movement   03/24/22 SUBJECTIVE STATEMENT: Pt states that when she has to go to the bathroom, she really has to go. She has a drive to [place] and is concerned. She does wear pessary, but has to take it out when she has bowel movement. Patient does states that she has breast pain - she is going to MD tomorrow. 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  Fluid intake: Yes: she limits fluid to some extent to stop urgency; she does drink 11 glasses of water a day and herbal tea     PAIN:  Are you having pain? Yes DDD NPRS scale: 1/10 Pain location:  low back   Pain type: aching Pain description: intermittent    Aggravating factors: not doing exercises Relieving factors: exercises   PRECAUTIONS: Other: previous cancer dx   WEIGHT BEARING RESTRICTIONS: No   FALLS:  Has patient fallen in last 6 months? No   LIVING ENVIRONMENT: Lives with: lives with their family Lives in: House/apartment     OCCUPATION: reitred  PLOF: Independent   PATIENT GOALS: improve urgency/leaking   PERTINENT HISTORY:  Breast  cancer dx 05/22/20, fibromyalgia, anal fissure, G6P6 Sexual abuse: No   BOWEL MOVEMENT: Pain with bowel movement: No Type of bowel movement:Frequency 2x/day, has to take pessary out and Strain No Fully empty rectum: Yes: - Leakage: No Pads: No Fiber supplement: Yes: psyllium husk   URINATION: Pain with urination: No Fully empty bladder: Yes: double voids Stream: Strong Urgency: Yes: if she does not go right away she will leak Frequency: every hour and a half - two hours Leakage: Urge to void, Walking to the bathroom, Coughing, Sneezing, and Laughing Pads: Yes: small pad   INTERCOURSE: Pain with intercourse:  no pain Ability to have vaginal penetration:  Yes: - Climax: - Marinoff Scale: 0/3   PREGNANCY: Vaginal deliveries 6 Tearing Yes: episiotomy C-section deliveries 0 Currently pregnant No   PROLAPSE: Cystocele pressure     OBJECTIVE:   05/19/22: PALPATION:   General  abdominal tenderness at midline just inferior to strenum                 External Perineal Exam dryness/redness                             Internal Pelvic Floor WNL   Patient confirms identification and approves PT to assess internal pelvic floor and treatment Yes   PELVIC MMT:   MMT eval  Vaginal   Internal Anal Sphincter    External Anal Sphincter    Puborectalis    Diastasis Recti 1 finger width separation with mild distortion   (Blank rows = not tested)         TONE: low   PROLAPSE: Grade 3 anterior vaginal wall laxity without pessary in   03/20/22:  COGNITION: Overall cognitive status: Within functional limits for tasks assessed                          SENSATION: Light touch: Appears intact other than tingling in bil LE Proprioception: Appears intact    MUSCLE LENGTH:     FUNCTIONAL TESTS:      GAIT:   Comments: WNL   POSTURE: rounded shoulders, forward head, decreased lumbar lordosis, and posterior pelvic tilt       LUMBARAROM/PROM:   A/PROM A/PROM  eval   Flexion    Extension    Right lateral flexion    Left lateral flexion    Right rotation    Left rotation     (Blank rows = not tested)       LOWER EXTREMITY MMT:     PALPATION:   General  abdominal tenderness at midline just inferior to strenum                 External Perineal Exam dryness/redness                             Internal Pelvic Floor WNL   Patient confirms identification and approves PT to assess internal pelvic floor and treatment Yes   PELVIC MMT:   MMT eval  Vaginal 1-2/5, varying coordination with notable abdominal bracing and LE contraction; 3 second endurance; 6 repeat contractions with poor motor control  Internal Anal Sphincter    External Anal Sphincter    Puborectalis    Diastasis Recti 1 finger width separation with mild distortion   (  Blank rows = not tested)         TONE: low   PROLAPSE: Grade 3 anterior vaginal wall laxity without pessary in   TODAY'S TREATMENT 06/24/22 Manual: Manual: Trigger Point Dry-Needling  Treatment instructions: Expect mild to moderate muscle soreness. S/S of pneumothorax if dry needled over a lung field, and to seek immediate medical attention should they occur. Patient verbalized understanding of these instructions and education.  Patient Consent Given: Yes Education handout provided: Yes Muscles treated: L4-5 lumbar paraspinals, bil glutes Electrical stimulation performed: No Parameters: N/A Treatment response/outcome: twitch response and release Negative pressure soft tissue mobilization to bil lumbar paraspinals Soft tissue mobilization to bil lumbar paraspinals Neuromuscular re-education: Pelvic tilts 2 x 10 Pallof press Bil extension 2 x 10, green band Exercises: Clam shells 2 x 10 bil Reverse clam shells 2 x 10 bil  Piriformis stretch 60 sec, seated   TREATMENT 06/19/22 Manual: Trigger Point Dry-Needling  Treatment instructions: Expect mild to moderate muscle soreness. S/S of pneumothorax if  dry needled over a lung field, and to seek immediate medical attention should they occur. Patient verbalized understanding of these instructions and education.  Patient Consent Given: Yes Education handout provided: Yes Muscles treated: L4-5 lumbar paraspinals, bil glutes Electrical stimulation performed: No Parameters: N/A Treatment response/outcome: twitch response and release Negative pressure soft tissue mobilization to bil lumbar paraspinals Soft tissue mobilization to bil lumbar paraspinals Neuromuscular re-education: Seated hip abduction yellow loop 10x Seated hip adduction ball squeeze 10x Seated hip IR yellow loop 10x    TREATMENT 05/19/21 RE-EVALUATION Manual: Pt provides verbal consent for internal vaginal/rectal pelvic floor exam Neuromuscular re-education: Standing slow march 2 x 10 Standing row 2 x 10 green band Standing shoulder extensions 2 x 10 green band Pallof press 10x bil green band     PATIENT EDUCATION:  Education details: see above self-care Person educated: Patient Education method: Explanation, Demonstration, Tactile cues, Verbal cues, and Handouts Education comprehension: verbalized understanding   HOME EXERCISE PROGRAM: [id number]   ASSESSMENT:   CLINICAL IMPRESSION: Pt doing well, but believe that low back and hips are bothering her due to lifting at home without awareness of good lifting mechanics/core activation. She was encouraged to practice lifting how we perform in session and activate her core with all activity until muscles can become more automatic. Good tolerance to dry needling and manual technique with large release of restriction in tissue. She could feel further release with seated piriformis stretch and pelvic tilts. She will continue to benefit from skilled PT intervention in order to improve pelvic floor strength and coordination, begin and progress pelvic floor/core functional strengthening program, and decrease urinary  urgency/incontinence in order to improve QOL.    OBJECTIVE IMPAIRMENTS: decreased activity tolerance, decreased coordination, decreased endurance, decreased strength, increased fascial restrictions, increased muscle spasms, impaired tone, postural dysfunction, and pain.    ACTIVITY LIMITATIONS: continence   PARTICIPATION LIMITATIONS: community activity   PERSONAL FACTORS: 1-2 comorbidities: Breast cancer dx 05/22/20, fibromyalgia, anal fissure, G6P6  are also affecting patient's functional outcome.    REHAB POTENTIAL: Good   CLINICAL DECISION MAKING: Stable/uncomplicated   EVALUATION COMPLEXITY: Low     GOALS: Goals reviewed with patient? Yes   SHORT TERM GOALS: Target date: 04/21/2022 - updated 04/28/22 - updated 05/19/21 - updated 06/24/22   Pt will be independent with HEP.    Baseline: Goal status: MET 04/28/22   2.  Pt will be independent with the knack, urge suppression technique, and double voiding in order  to improve bladder habits and decrease urinary incontinence.    Baseline:  Goal status: MET 05/19/22   3.  Pt will be able to correctly perform diaphragmatic breathing and appropriate pressure management in order to prevent worsening vaginal wall laxity and improve pelvic floor A/ROM.    Baseline:  Goal status: MET 05/19/22       LONG TERM GOALS: Target date: 05/19/2022 - updated 04/28/22 - updated 05/19/21 new target 07/28/2022 updated 06/24/22   Pt will be independent with advanced HEP.    Baseline:  Goal status: IN PROGRESS   2.  Pt will be independent with the knack, urge suppression technique, and double voiding in order to improve bladder habits and decrease urinary incontinence.    Baseline:  Goal status: MET 05/19/22  3.  Pt will be able to go 2-3 hours in between voids without urgency or incontinence in order to improve QOL and perform all functional activities with less difficulty.    Baseline: most of the time she is getting to 2 hours, but sometimes leaking  on the way to the bathroom Goal status: IN PROGRESS   4.  Pt will be able to go on long car trips without having to stop frequently to urinate.  Baseline: improving  Goal status:IN PROGRESS   5. Pt will report no episodes of urinary incontinence in order to improve confidence in community activities and personal hygiene.    Baseline:  Goal status: IN PROGRESS       PLAN:   PT FREQUENCY: 1x/week   PT DURATION: 8 weeks   PLANNED INTERVENTIONS: Therapeutic exercises, Therapeutic activity, Neuromuscular re-education, Balance training, Gait training, Patient/Family education, Self Care, Joint mobilization, Dry Needling, Biofeedback, and Manual therapy   PLAN FOR NEXT SESSION: Progress core/hip strengthening to standing positions.  Work on addressing back/bil hip pain to optimize ability to strengthen pelvic floor.    Heather Roberts, PT, DPT02/13/2412:29 PM

## 2022-06-25 ENCOUNTER — Telehealth: Payer: Medicare HMO | Admitting: Internal Medicine

## 2022-06-25 DIAGNOSIS — F6089 Other specific personality disorders: Secondary | ICD-10-CM | POA: Diagnosis not present

## 2022-06-25 DIAGNOSIS — R69 Illness, unspecified: Secondary | ICD-10-CM | POA: Diagnosis not present

## 2022-07-01 ENCOUNTER — Ambulatory Visit: Payer: Medicare HMO

## 2022-07-01 DIAGNOSIS — R293 Abnormal posture: Secondary | ICD-10-CM | POA: Diagnosis not present

## 2022-07-01 DIAGNOSIS — M62838 Other muscle spasm: Secondary | ICD-10-CM | POA: Diagnosis not present

## 2022-07-01 DIAGNOSIS — R69 Illness, unspecified: Secondary | ICD-10-CM | POA: Diagnosis not present

## 2022-07-01 DIAGNOSIS — M6281 Muscle weakness (generalized): Secondary | ICD-10-CM

## 2022-07-01 DIAGNOSIS — Z113 Encounter for screening for infections with a predominantly sexual mode of transmission: Secondary | ICD-10-CM | POA: Diagnosis not present

## 2022-07-01 DIAGNOSIS — R279 Unspecified lack of coordination: Secondary | ICD-10-CM | POA: Diagnosis not present

## 2022-07-01 DIAGNOSIS — Z01419 Encounter for gynecological examination (general) (routine) without abnormal findings: Secondary | ICD-10-CM | POA: Diagnosis not present

## 2022-07-01 NOTE — Therapy (Signed)
OUTPATIENT PHYSICAL THERAPY TREATMENT NOTE   Patient Name: [name] MRN: [id number] DOB:1956/05/10, 67 y.o., female Today's Date: 07/01/2022  PCP: Burnis Medin, MD REFERRING PROVIDER: Vladimir Crofts, PA-C  END OF SESSION:   PT End of Session - 07/01/22 1148     Visit Number 8    Date for PT Re-Evaluation 07/28/22    Authorization Type [employer]    PT Start Time 1145    PT Stop Time 1225    PT Time Calculation (min) 40 min    Activity Tolerance Patient tolerated treatment well    Behavior During Therapy WFL for tasks assessed/performed                Past Medical History:  Diagnosis Date   Anal fissure    Atrial septal aneurysm / if pfo  echo 6 13  10/23/2011   Breast cancer ([place])    Cataract    both eyes   Depression    Fatty liver    "pre fatty liver"   Fibromyalgia    Headache(784.0)    hx of migraines when younger   Heart palpitations    hx with normal holter event monitoring   Hx: UTI (urinary tract infection)    Hyperlipidemia    Hypertension    Hypothyroidism    Obesity    Personal history of chemotherapy    Personal history of radiation therapy    Pneumonia 1972   hx of   Polyp of colon    Serrated adenoma of colon 08/2012   Skin cancer    basal, squamous cell   Sleep apnea    uses cpap   Past Surgical History:  Procedure Laterality Date   BREAST LUMPECTOMY     BREAST LUMPECTOMY WITH RADIOACTIVE SEED AND SENTINEL LYMPH NODE BIOPSY Right 07/26/2020   Procedure: RIGHT BREAST LUMPECTOMY WITH RADIOACTIVE SEED AND SENTINEL LYMPH NODE BIOPSY;  Surgeon: Jovita Kussmaul, MD;  Location: [facility];  Service: General;  Laterality: Right;   BREAST LUMPECTOMY WITH RADIOACTIVE SEED LOCALIZATION Left 07/26/2020   Procedure: LEFT BREAST LUMPECTOMY X 2  WITH RADIOACTIVE SEED LOCALIZATION;  Surgeon: Jovita Kussmaul, MD;  Location: [facility];  Service: General;  Laterality: Left;   COLONOSCOPY     DILATION AND CURETTAGE OF [place] ARTHROSCOPY     both in past   POLYPECTOMY     [place] Left 07/07/2013   Procedure: LEFT SHOULDER MINI OPEN SUBACROMIAL DECOMPRESSION ROTATOR CUFF REPAIR AND POSSIBLE PATCH GRAFT ;  Surgeon: Johnn Hai, MD;  Location: [facility];  Service: Orthopedics;  Laterality: Left;  with interscaline block   SKIN CANCER EXCISION Bilateral    arm, legs, and chest   TONSILLECTOMY     Patient Active Problem List   Diagnosis Date Noted   Malignant neoplasm of upper-outer quadrant of right breast in female, estrogen receptor positive ([place]) 06/25/2020   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   Hepatic steatosis 11/27/2014   Hx of adenomatous colonic polyps 11/27/2014   Visit for preventive health examination 03/28/2014   Hyperlipidemia 03/28/2014   Left rotator cuff tear 07/07/2013   Tear of left rotator cuff 07/07/2013   Rhinitis 06/01/2013   Chest wall deformity 04/12/2012   Atrial septal aneurysm / if pfo  echo 6 13  10/23/2011   OSA (obstructive sleep apnea) 11/27/2010   Dyslipidemia 11/01/2010  [place] 08/29/2010   Labile hypertension 08/29/2010   MUSCLE CRAMPS, FOOT 06/10/2010   OTHER SLEEP DISTURBANCES 06/10/2010   ANXIETY, SITUATIONAL 05/08/2010   VERTIGO, POSITIONAL 03/15/2010   VITAMIN D DEFICIENCY 02/18/2010   OBESITY 02/18/2010   ALLERGIC RHINITIS 02/18/2010   PLANTAR FASCIITIS 02/18/2010   TWITCHING 06/11/2009   NUMBNESS, HAND 06/11/2009   CERVICAL STRAIN, ACUTE 06/11/2009   OTHER MALAISE AND FATIGUE 07/09/2007   HYPERTENSION 01/28/2007   Hypothyroidism 11/11/2006   COMMON MIGRAINE 11/11/2006   GERD 11/11/2006   FIBROMYALGIA 11/11/2006    REFERRING DIAG: K64.9 (ICD-10-CM) - Hemorrhoids, unspecified hemorrhoid type N81.4 (ICD-10-CM) - Cystocele with prolapse  THERAPY DIAG:  Abnormal posture  Muscle weakness (generalized)  Other muscle spasm  Unspecified lack of coordination  Rationale  for Evaluation and Treatment Rehabilitation  PERTINENT HISTORY: Breast cancer dx 05/22/20, fibromyalgia, anal fissure, G6P6  PRECAUTIONS: hx breast cancer  SUBJECTIVE:                                                                                                                                                                                      SUBJECTIVE STATEMENT: Pt states that her low back continues to be sore. She was uncomfortable first and second day after DN, but was fine by the third day. She is unsure if this was helpful. She fell several weeks ago and states that her back has hurt since. She is seeing improvement in nocturia to only 1x/night. She feels like urge suppression technique has continued to be helpful and she can calmly make it to the bathroom.    PAIN:  Are you having pain? No and Yes: NPRS scale: 3/10 Pain location: low back, bil posterior hips Pain description: uncomfortable Aggravating factors: prolonged standing, lifting, exercises, prolonged sitting Relieving factors: exercises, movement   03/24/22 SUBJECTIVE STATEMENT: Pt states that when she has to go to the bathroom, she really has to go. She has a drive to [place] and is concerned. She does wear pessary, but has to take it out when she has bowel movement. Patient does states that she has breast pain - she is going to MD tomorrow. 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  Fluid intake: Yes: she limits fluid to some extent to stop urgency; she does drink 11 glasses of water a day and herbal tea     PAIN:  Are you having pain? Yes DDD NPRS scale: 1/10 Pain location:  low back   Pain type: aching Pain description: intermittent    Aggravating factors: not doing exercises Relieving factors: exercises   PRECAUTIONS: Other: previous cancer dx   WEIGHT BEARING RESTRICTIONS: No   FALLS:  Has patient fallen in last 6 months? No   LIVING ENVIRONMENT: Lives with: lives with their family Lives in:  House/apartment     OCCUPATION: reitred   PLOF: Independent   PATIENT GOALS: improve urgency/leaking   PERTINENT HISTORY:  Breast cancer dx 05/22/20, fibromyalgia, anal fissure, G6P6 Sexual abuse: No   BOWEL MOVEMENT: Pain with bowel movement: No Type of bowel movement:Frequency 2x/day, has to take pessary out and Strain No Fully empty rectum: Yes: - Leakage: No Pads: No Fiber supplement: Yes: psyllium husk   URINATION: Pain with urination: No Fully empty bladder: Yes: double voids Stream: Strong Urgency: Yes: if she does not go right away she will leak Frequency: every hour and a half - two hours Leakage: Urge to void, Walking to the bathroom, Coughing, Sneezing, and Laughing Pads: Yes: small pad   INTERCOURSE: Pain with intercourse:  no pain Ability to have vaginal penetration:  Yes: - Climax: - Marinoff Scale: 0/3   PREGNANCY: Vaginal deliveries 6 Tearing Yes: episiotomy C-section deliveries 0 Currently pregnant No   PROLAPSE: Cystocele pressure     OBJECTIVE:   05/19/22: PALPATION:   General  abdominal tenderness at midline just inferior to strenum                 External Perineal Exam dryness/redness                             Internal Pelvic Floor WNL   Patient confirms identification and approves PT to assess internal pelvic floor and treatment Yes   PELVIC MMT:   MMT eval  Vaginal   Internal Anal Sphincter    External Anal Sphincter    Puborectalis    Diastasis Recti 1 finger width separation with mild distortion   (Blank rows = not tested)         TONE: low   PROLAPSE: Grade 3 anterior vaginal wall laxity without pessary in   03/20/22:  COGNITION: Overall cognitive status: Within functional limits for tasks assessed                          SENSATION: Light touch: Appears intact other than tingling in bil LE Proprioception: Appears intact    MUSCLE LENGTH:     FUNCTIONAL TESTS:      GAIT:   Comments: WNL   POSTURE:  rounded shoulders, forward head, decreased lumbar lordosis, and posterior pelvic tilt       LUMBARAROM/PROM:   A/PROM A/PROM  eval  Flexion    Extension    Right lateral flexion    Left lateral flexion    Right rotation    Left rotation     (Blank rows = not tested)       LOWER EXTREMITY MMT:     PALPATION:   General  abdominal tenderness at midline just inferior to strenum                 External Perineal Exam dryness/redness                             Internal Pelvic Floor WNL   Patient confirms identification and approves PT to assess internal pelvic floor and treatment Yes   PELVIC MMT:   MMT eval  Vaginal 1-2/5, varying coordination with notable abdominal bracing and LE contraction; 3 second endurance; 6 repeat contractions with poor  motor control  Internal Anal Sphincter    External Anal Sphincter    Puborectalis    Diastasis Recti 1 finger width separation with mild distortion   (Blank rows = not tested)         TONE: low   PROLAPSE: Grade 3 anterior vaginal wall laxity without pessary in   TODAY'S TREATMENT 07/01/22 Neuromuscular re-education: Seated ball squeeze with bil UE 2 x 10 Seated ball squeeze with 2 lb UE lift, 2 x 10 Seated D2/chop with ball squeeze 10x bil 2lbs Exercises: Seated piriformis stretch 60 seconds bil Pelvic tilts 2 x 10   TREATMENT 06/24/22 Manual: Manual: Trigger Point Dry-Needling  Treatment instructions: Expect mild to moderate muscle soreness. S/S of pneumothorax if dry needled over a lung field, and to seek immediate medical attention should they occur. Patient verbalized understanding of these instructions and education.  Patient Consent Given: Yes Education handout provided: Yes Muscles treated: L4-5 lumbar paraspinals, bil glutes Electrical stimulation performed: No Parameters: N/A Treatment response/outcome: twitch response and release Negative pressure soft tissue mobilization to bil lumbar paraspinals Soft  tissue mobilization to bil lumbar paraspinals Neuromuscular re-education: Pelvic tilts 2 x 10 Pallof press Bil extension 2 x 10, green band Exercises: Clam shells 2 x 10 bil Reverse clam shells 2 x 10 bil  Piriformis stretch 60 sec, seated   TREATMENT 06/19/22 Manual: Trigger Point Dry-Needling  Treatment instructions: Expect mild to moderate muscle soreness. S/S of pneumothorax if dry needled over a lung field, and to seek immediate medical attention should they occur. Patient verbalized understanding of these instructions and education.  Patient Consent Given: Yes Education handout provided: Yes Muscles treated: L4-5 lumbar paraspinals, bil glutes Electrical stimulation performed: No Parameters: N/A Treatment response/outcome: twitch response and release Negative pressure soft tissue mobilization to bil lumbar paraspinals Soft tissue mobilization to bil lumbar paraspinals Neuromuscular re-education: Seated hip abduction yellow loop 10x Seated hip adduction ball squeeze 10x Seated hip IR yellow loop 10x   PATIENT EDUCATION:  Education details: see above self-care Person educated: Patient Education method: Explanation, Demonstration, Tactile cues, Verbal cues, and Handouts Education comprehension: verbalized understanding   HOME EXERCISE PROGRAM: [id number]   ASSESSMENT:   CLINICAL IMPRESSION: Pt is doing very well with more control over urgency, longer voiding intervals, less nocturia, and less leaking. She is still having small urinary leaks. She did very well with all exercise progressions and is demonstrating more control over deep core coordination. Some low back pain with exercises initially, but as she was able to gain control of appropriate motor control, she was able stop any low back pain. She will continue to benefit from skilled PT intervention in order to improve pelvic floor strength and coordination, begin and progress pelvic floor/core functional strengthening  program, and decrease urinary urgency/incontinence in order to improve QOL.    OBJECTIVE IMPAIRMENTS: decreased activity tolerance, decreased coordination, decreased endurance, decreased strength, increased fascial restrictions, increased muscle spasms, impaired tone, postural dysfunction, and pain.    ACTIVITY LIMITATIONS: continence   PARTICIPATION LIMITATIONS: community activity   PERSONAL FACTORS: 1-2 comorbidities: Breast cancer dx 05/22/20, fibromyalgia, anal fissure, G6P6  are also affecting patient's functional outcome.    REHAB POTENTIAL: Good   CLINICAL DECISION MAKING: Stable/uncomplicated   EVALUATION COMPLEXITY: Low     GOALS: Goals reviewed with patient? Yes   SHORT TERM GOALS: Target date: 04/21/2022 - updated 04/28/22 - updated 05/19/21 - updated 06/24/22   Pt will be independent with HEP.    Baseline: Goal status:  MET 04/28/22   2.  Pt will be independent with the knack, urge suppression technique, and double voiding in order to improve bladder habits and decrease urinary incontinence.    Baseline:  Goal status: MET 05/19/22   3.  Pt will be able to correctly perform diaphragmatic breathing and appropriate pressure management in order to prevent worsening vaginal wall laxity and improve pelvic floor A/ROM.    Baseline:  Goal status: MET 05/19/22       LONG TERM GOALS: Target date: 05/19/2022 - updated 04/28/22 - updated 05/19/21 new target 07/28/2022 updated 06/24/22   Pt will be independent with advanced HEP.    Baseline:  Goal status: IN PROGRESS   2.  Pt will be independent with the knack, urge suppression technique, and double voiding in order to improve bladder habits and decrease urinary incontinence.    Baseline:  Goal status: MET 05/19/22  3.  Pt will be able to go 2-3 hours in between voids without urgency or incontinence in order to improve QOL and perform all functional activities with less difficulty.    Baseline: most of the time she is getting to 2  hours, but sometimes leaking on the way to the bathroom Goal status: IN PROGRESS   4.  Pt will be able to go on long car trips without having to stop frequently to urinate.  Baseline: improving  Goal status:IN PROGRESS   5. Pt will report no episodes of urinary incontinence in order to improve confidence in community activities and personal hygiene.    Baseline:  Goal status: IN PROGRESS       PLAN:   PT FREQUENCY: 1x/week   PT DURATION: 8 weeks   PLANNED INTERVENTIONS: Therapeutic exercises, Therapeutic activity, Neuromuscular re-education, Balance training, Gait training, Patient/Family education, Self Care, Joint mobilization, Dry Needling, Biofeedback, and Manual therapy   PLAN FOR NEXT SESSION: Progress core/hip strengthening to standing positions.  Work on addressing back/bil hip pain to optimize ability to strengthen pelvic floor.    Heather Roberts, PT, DPT02/20/2412:25 PM

## 2022-07-07 DIAGNOSIS — E039 Hypothyroidism, unspecified: Secondary | ICD-10-CM | POA: Diagnosis not present

## 2022-07-08 ENCOUNTER — Ambulatory Visit: Payer: Medicare HMO

## 2022-07-08 DIAGNOSIS — M6281 Muscle weakness (generalized): Secondary | ICD-10-CM | POA: Diagnosis not present

## 2022-07-08 DIAGNOSIS — R293 Abnormal posture: Secondary | ICD-10-CM

## 2022-07-08 DIAGNOSIS — M62838 Other muscle spasm: Secondary | ICD-10-CM | POA: Diagnosis not present

## 2022-07-08 DIAGNOSIS — R279 Unspecified lack of coordination: Secondary | ICD-10-CM

## 2022-07-08 NOTE — Therapy (Signed)
OUTPATIENT PHYSICAL THERAPY TREATMENT NOTE   Patient Name: KAMARIAH KIRSH MRN: [id number] DOB:03/27/1956, 67 y.o., female Today's Date: 07/08/2022  PCP: Burnis Medin, MD REFERRING PROVIDER: Vladimir Crofts, PA-C  END OF SESSION:   PT End of Session - 07/08/22 1058     Visit Number 9    Date for PT Re-Evaluation 07/28/22    Authorization Type [employer]    PT Start Time 1100    PT Stop Time 1140    PT Time Calculation (min) 40 min    Activity Tolerance Patient tolerated treatment well    Behavior During Therapy WFL for tasks assessed/performed                 Past Medical History:  Diagnosis Date   Anal fissure    Atrial septal aneurysm / if pfo  echo 6 13  10/23/2011   Breast cancer ([place])    Cataract    both eyes   Depression    Fatty liver    "pre fatty liver"   Fibromyalgia    Headache(784.0)    hx of migraines when younger   Heart palpitations    hx with normal holter event monitoring   Hx: UTI (urinary tract infection)    Hyperlipidemia    Hypertension    Hypothyroidism    Obesity    Personal history of chemotherapy    Personal history of radiation therapy    Pneumonia 1972   hx of   Polyp of colon    Serrated adenoma of colon 08/2012   Skin cancer    basal, squamous cell   Sleep apnea    uses cpap   Past Surgical History:  Procedure Laterality Date   BREAST LUMPECTOMY     BREAST LUMPECTOMY WITH RADIOACTIVE SEED AND SENTINEL LYMPH NODE BIOPSY Right 07/26/2020   Procedure: RIGHT BREAST LUMPECTOMY WITH RADIOACTIVE SEED AND SENTINEL LYMPH NODE BIOPSY;  Surgeon: Jovita Kussmaul, MD;  Location: [facility];  Service: General;  Laterality: Right;   BREAST LUMPECTOMY WITH RADIOACTIVE SEED LOCALIZATION Left 07/26/2020   Procedure: LEFT BREAST LUMPECTOMY X 2  WITH RADIOACTIVE SEED LOCALIZATION;  Surgeon: Jovita Kussmaul, MD;  Location: [facility];  Service: General;  Laterality: Left;   COLONOSCOPY     DILATION AND CURETTAGE OF [place] ARTHROSCOPY     both in past   POLYPECTOMY     [place] Left 07/07/2013   Procedure: LEFT SHOULDER MINI OPEN SUBACROMIAL DECOMPRESSION ROTATOR CUFF REPAIR AND POSSIBLE PATCH GRAFT ;  Surgeon: Johnn Hai, MD;  Location: [facility];  Service: Orthopedics;  Laterality: Left;  with interscaline block   SKIN CANCER EXCISION Bilateral    arm, legs, and chest   TONSILLECTOMY     Patient Active Problem List   Diagnosis Date Noted   Malignant neoplasm of upper-outer quadrant of right breast in female, estrogen receptor positive ([place]) 06/25/2020   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   Hepatic steatosis 11/27/2014   Hx of adenomatous colonic polyps 11/27/2014   Visit for preventive health examination 03/28/2014   Hyperlipidemia 03/28/2014   Left rotator cuff tear 07/07/2013   Tear of left rotator cuff 07/07/2013   Rhinitis 06/01/2013   Chest wall deformity 04/12/2012   Atrial septal aneurysm / if pfo  echo 6 13  10/23/2011   OSA (obstructive sleep apnea) 11/27/2010   Dyslipidemia 11/01/2010  [place] 08/29/2010   Labile hypertension 08/29/2010   MUSCLE CRAMPS, FOOT 06/10/2010   OTHER SLEEP DISTURBANCES 06/10/2010   ANXIETY, SITUATIONAL 05/08/2010   VERTIGO, POSITIONAL 03/15/2010   VITAMIN D DEFICIENCY 02/18/2010   OBESITY 02/18/2010   ALLERGIC RHINITIS 02/18/2010   PLANTAR FASCIITIS 02/18/2010   TWITCHING 06/11/2009   NUMBNESS, HAND 06/11/2009   CERVICAL STRAIN, ACUTE 06/11/2009   OTHER MALAISE AND FATIGUE 07/09/2007   HYPERTENSION 01/28/2007   Hypothyroidism 11/11/2006   COMMON MIGRAINE 11/11/2006   GERD 11/11/2006   FIBROMYALGIA 11/11/2006    REFERRING DIAG: K64.9 (ICD-10-CM) - Hemorrhoids, unspecified hemorrhoid type N81.4 (ICD-10-CM) - Cystocele with prolapse  THERAPY DIAG:  Abnormal posture  Muscle weakness (generalized)  Other muscle spasm  Unspecified lack of coordination  Rationale  for Evaluation and Treatment Rehabilitation  PERTINENT HISTORY: Breast cancer dx 05/22/20, fibromyalgia, anal fissure, G6P6  PRECAUTIONS: hx breast cancer  SUBJECTIVE:                                                                                                                                                                                      SUBJECTIVE STATEMENT: Pt states that she is making it to the bathroom 95% of the time. She is only getting up 1-2x/night. She is not wearing a panty liner today to see how she does.    PAIN:  Are you having pain? No and Yes: NPRS scale: 3/10 Pain location: low back, bil posterior hips Pain description: uncomfortable Aggravating factors: prolonged standing, lifting, exercises, prolonged sitting Relieving factors: exercises, movement   03/24/22 SUBJECTIVE STATEMENT: Pt states that when she has to go to the bathroom, she really has to go. She has a drive to [place] and is concerned. She does wear pessary, but has to take it out when she has bowel movement. Patient does states that she has breast pain - she is going to MD tomorrow. 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  Fluid intake: Yes: she limits fluid to some extent to stop urgency; she does drink 11 glasses of water a day and herbal tea     PAIN:  Are you having pain? Yes DDD NPRS scale: 1/10 Pain location:  low back   Pain type: aching Pain description: intermittent    Aggravating factors: not doing exercises Relieving factors: exercises   PRECAUTIONS: Other: previous cancer dx   WEIGHT BEARING RESTRICTIONS: No   FALLS:  Has patient fallen in last 6 months? No   LIVING ENVIRONMENT: Lives with: lives with their family Lives in: House/apartment     OCCUPATION: reitred   PLOF: Independent   PATIENT GOALS: improve urgency/leaking   PERTINENT HISTORY:  Breast cancer dx 05/22/20, fibromyalgia, anal fissure, G6P6 Sexual abuse: No   BOWEL MOVEMENT: Pain with bowel  movement: No Type of bowel movement:Frequency 2x/day, has to take pessary out and Strain No Fully empty rectum: Yes: - Leakage: No Pads: No Fiber supplement: Yes: psyllium husk   URINATION: Pain with urination: No Fully empty bladder: Yes: double voids Stream: Strong Urgency: Yes: if she does not go right away she will leak Frequency: every hour and a half - two hours Leakage: Urge to void, Walking to the bathroom, Coughing, Sneezing, and Laughing Pads: Yes: small pad   INTERCOURSE: Pain with intercourse:  no pain Ability to have vaginal penetration:  Yes: - Climax: - Marinoff Scale: 0/3   PREGNANCY: Vaginal deliveries 6 Tearing Yes: episiotomy C-section deliveries 0 Currently pregnant No   PROLAPSE: Cystocele pressure     OBJECTIVE:   05/19/22: PALPATION:   General  abdominal tenderness at midline just inferior to strenum                 External Perineal Exam dryness/redness                             Internal Pelvic Floor WNL   Patient confirms identification and approves PT to assess internal pelvic floor and treatment Yes   PELVIC MMT:   MMT eval  Vaginal   Internal Anal Sphincter    External Anal Sphincter    Puborectalis    Diastasis Recti 1 finger width separation with mild distortion   (Blank rows = not tested)         TONE: low   PROLAPSE: Grade 3 anterior vaginal wall laxity without pessary in   03/20/22:  COGNITION: Overall cognitive status: Within functional limits for tasks assessed                          SENSATION: Light touch: Appears intact other than tingling in bil LE Proprioception: Appears intact    MUSCLE LENGTH:     FUNCTIONAL TESTS:      GAIT:   Comments: WNL   POSTURE: rounded shoulders, forward head, decreased lumbar lordosis, and posterior pelvic tilt       LUMBARAROM/PROM:   A/PROM A/PROM  eval  Flexion    Extension    Right lateral flexion    Left lateral flexion    Right rotation    Left rotation      (Blank rows = not tested)       LOWER EXTREMITY MMT:     PALPATION:   General  abdominal tenderness at midline just inferior to strenum                 External Perineal Exam dryness/redness                             Internal Pelvic Floor WNL   Patient confirms identification and approves PT to assess internal pelvic floor and treatment Yes   PELVIC MMT:   MMT eval  Vaginal 1-2/5, varying coordination with notable abdominal bracing and LE contraction; 3 second endurance; 6 repeat contractions with poor motor control  Internal Anal Sphincter    External Anal Sphincter    Puborectalis    Diastasis Recti 1 finger width separation with mild distortion   (Blank rows = not tested)  TONE: low   PROLAPSE: Grade 3 anterior vaginal wall laxity without pessary in   TODAY'S TREATMENT 07/08/22 Neuromuscular re-education: Side lying UE ball press 10x bil Supine reverse table top on red ball, UE ball press 2 x 10 Supine reverse table top on red ball, UE chop with ball press 10x bil Reverse table top on red ball lower trunk rotations Therapeutic activities: Overhead press single arm 3lb Squats to table 2 x 10   TREATMENT 07/01/22 Neuromuscular re-education: Seated ball squeeze with bil UE 2 x 10 Seated ball squeeze with 2 lb UE lift, 2 x 10 Seated D2/chop with ball squeeze 10x bil 2lbs Exercises: Seated piriformis stretch 60 seconds bil Pelvic tilts 2 x 10   TREATMENT 06/24/22 Manual: Manual: Trigger Point Dry-Needling  Treatment instructions: Expect mild to moderate muscle soreness. S/S of pneumothorax if dry needled over a lung field, and to seek immediate medical attention should they occur. Patient verbalized understanding of these instructions and education.  Patient Consent Given: Yes Education handout provided: Yes Muscles treated: L4-5 lumbar paraspinals, bil glutes Electrical stimulation performed: No Parameters: N/A Treatment response/outcome:  twitch response and release Negative pressure soft tissue mobilization to bil lumbar paraspinals Soft tissue mobilization to bil lumbar paraspinals Neuromuscular re-education: Pelvic tilts 2 x 10 Pallof press Bil extension 2 x 10, green band Exercises: Clam shells 2 x 10 bil Reverse clam shells 2 x 10 bil  Piriformis stretch 60 sec, seated  PATIENT EDUCATION:  Education details: see above self-care Person educated: Patient Education method: Explanation, Demonstration, Tactile cues, Verbal cues, and Handouts Education comprehension: verbalized understanding   HOME EXERCISE PROGRAM: [id number]   ASSESSMENT:   CLINICAL IMPRESSION: Pt is doing remarkably well demonstrated by report of being 95% leak free and confident enough that she is not wearing a pad for the first time today. She was able to progress many deep core activation exercises with excellent form and breath coordination, understanding "exhale with effort" without cuing in new exercises. Due to progress believe she will be prepared for D/C in another 2 sessions. She will continue to benefit from skilled PT intervention in order to improve pelvic floor strength and coordination, begin and progress pelvic floor/core functional strengthening program, and decrease urinary urgency/incontinence in order to improve QOL.    OBJECTIVE IMPAIRMENTS: decreased activity tolerance, decreased coordination, decreased endurance, decreased strength, increased fascial restrictions, increased muscle spasms, impaired tone, postural dysfunction, and pain.    ACTIVITY LIMITATIONS: continence   PARTICIPATION LIMITATIONS: community activity   PERSONAL FACTORS: 1-2 comorbidities: Breast cancer dx 05/22/20, fibromyalgia, anal fissure, G6P6  are also affecting patient's functional outcome.    REHAB POTENTIAL: Good   CLINICAL DECISION MAKING: Stable/uncomplicated   EVALUATION COMPLEXITY: Low     GOALS: Goals reviewed with patient? Yes   SHORT  TERM GOALS: Target date: 04/21/2022 - updated 04/28/22 - updated 05/19/21 - updated 06/24/22   Pt will be independent with HEP.    Baseline: Goal status: MET 04/28/22   2.  Pt will be independent with the knack, urge suppression technique, and double voiding in order to improve bladder habits and decrease urinary incontinence.    Baseline:  Goal status: MET 05/19/22   3.  Pt will be able to correctly perform diaphragmatic breathing and appropriate pressure management in order to prevent worsening vaginal wall laxity and improve pelvic floor A/ROM.    Baseline:  Goal status: MET 05/19/22       LONG TERM GOALS: Target date: 05/19/2022 - updated 04/28/22 -  updated 05/19/21 new target 07/28/2022 updated 06/24/22   Pt will be independent with advanced HEP.    Baseline:  Goal status: IN PROGRESS   2.  Pt will be independent with the knack, urge suppression technique, and double voiding in order to improve bladder habits and decrease urinary incontinence.    Baseline:  Goal status: MET 05/19/22  3.  Pt will be able to go 2-3 hours in between voids without urgency or incontinence in order to improve QOL and perform all functional activities with less difficulty.    Baseline: most of the time she is getting to 2 hours, but sometimes leaking on the way to the bathroom Goal status: IN PROGRESS   4.  Pt will be able to go on long car trips without having to stop frequently to urinate.  Baseline: improving  Goal status:IN PROGRESS   5. Pt will report no episodes of urinary incontinence in order to improve confidence in community activities and personal hygiene.    Baseline:  Goal status: IN PROGRESS       PLAN:   PT FREQUENCY: 1x/week   PT DURATION: 8 weeks   PLANNED INTERVENTIONS: Therapeutic exercises, Therapeutic activity, Neuromuscular re-education, Balance training, Gait training, Patient/Family education, Self Care, Joint mobilization, Dry Needling, Biofeedback, and Manual therapy    PLAN FOR NEXT SESSION: Progress core/hip strengthening to standing positions.  Work on addressing back/bil hip pain to optimize ability to strengthen pelvic floor.  2 more appointments before D/C.    Heather Roberts, PT, DPT02/27/2412:46 PM

## 2022-07-10 DIAGNOSIS — M9902 Segmental and somatic dysfunction of thoracic region: Secondary | ICD-10-CM | POA: Diagnosis not present

## 2022-07-10 DIAGNOSIS — M531 Cervicobrachial syndrome: Secondary | ICD-10-CM | POA: Diagnosis not present

## 2022-07-10 DIAGNOSIS — M9901 Segmental and somatic dysfunction of cervical region: Secondary | ICD-10-CM | POA: Diagnosis not present

## 2022-07-10 DIAGNOSIS — M9903 Segmental and somatic dysfunction of lumbar region: Secondary | ICD-10-CM | POA: Diagnosis not present

## 2022-07-15 ENCOUNTER — Ambulatory Visit: Payer: Medicare HMO | Attending: Physician Assistant

## 2022-07-15 DIAGNOSIS — M62838 Other muscle spasm: Secondary | ICD-10-CM | POA: Insufficient documentation

## 2022-07-15 DIAGNOSIS — R293 Abnormal posture: Secondary | ICD-10-CM | POA: Insufficient documentation

## 2022-07-15 DIAGNOSIS — M6281 Muscle weakness (generalized): Secondary | ICD-10-CM | POA: Diagnosis not present

## 2022-07-15 DIAGNOSIS — R279 Unspecified lack of coordination: Secondary | ICD-10-CM | POA: Insufficient documentation

## 2022-07-15 NOTE — Therapy (Signed)
OUTPATIENT PHYSICAL THERAPY TREATMENT NOTE   Patient Name: [name] MRN: [id number] DOB:05/20/1955, 67 y.o., female Today's Date: 07/15/2022  PCP: Burnis Medin, MD REFERRING PROVIDER: Vladimir Crofts, PA-C  END OF SESSION:   PT End of Session - 07/15/22 1102     Visit Number 10    Date for PT Re-Evaluation 07/28/22    Authorization Type [employer]    PT Start Time 1100    PT Stop Time 1140    PT Time Calculation (min) 40 min    Activity Tolerance Patient tolerated treatment well    Behavior During Therapy WFL for tasks assessed/performed                  Past Medical History:  Diagnosis Date   Anal fissure    Atrial septal aneurysm / if pfo  echo 6 13  10/23/2011   Breast cancer ([place])    Cataract    both eyes   Depression    Fatty liver    "pre fatty liver"   Fibromyalgia    Headache(784.0)    hx of migraines when younger   Heart palpitations    hx with normal holter event monitoring   Hx: UTI (urinary tract infection)    Hyperlipidemia    Hypertension    Hypothyroidism    Obesity    Personal history of chemotherapy    Personal history of radiation therapy    Pneumonia 1972   hx of   Polyp of colon    Serrated adenoma of colon 08/2012   Skin cancer    basal, squamous cell   Sleep apnea    uses cpap   Past Surgical History:  Procedure Laterality Date   BREAST LUMPECTOMY     BREAST LUMPECTOMY WITH RADIOACTIVE SEED AND SENTINEL LYMPH NODE BIOPSY Right 07/26/2020   Procedure: RIGHT BREAST LUMPECTOMY WITH RADIOACTIVE SEED AND SENTINEL LYMPH NODE BIOPSY;  Surgeon: Jovita Kussmaul, MD;  Location: [facility];  Service: General;  Laterality: Right;   BREAST LUMPECTOMY WITH RADIOACTIVE SEED LOCALIZATION Left 07/26/2020   Procedure: LEFT BREAST LUMPECTOMY X 2  WITH RADIOACTIVE SEED LOCALIZATION;  Surgeon: Jovita Kussmaul, MD;  Location: [facility];  Service: General;  Laterality: Left;   COLONOSCOPY     DILATION AND CURETTAGE OF [place] ARTHROSCOPY     both in past   POLYPECTOMY     [place] Left 07/07/2013   Procedure: LEFT SHOULDER MINI OPEN SUBACROMIAL DECOMPRESSION ROTATOR CUFF REPAIR AND POSSIBLE PATCH GRAFT ;  Surgeon: Johnn Hai, MD;  Location: [facility];  Service: Orthopedics;  Laterality: Left;  with interscaline block   SKIN CANCER EXCISION Bilateral    arm, legs, and chest   TONSILLECTOMY     Patient Active Problem List   Diagnosis Date Noted   Malignant neoplasm of upper-outer quadrant of right breast in female, estrogen receptor positive ([place]) 06/25/2020   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   Hepatic steatosis 11/27/2014   Hx of adenomatous colonic polyps 11/27/2014   Visit for preventive health examination 03/28/2014   Hyperlipidemia 03/28/2014   Left rotator cuff tear 07/07/2013   Tear of left rotator cuff 07/07/2013   Rhinitis 06/01/2013   Chest wall deformity 04/12/2012   Atrial septal aneurysm / if pfo  echo 6 13  10/23/2011   OSA (obstructive sleep apnea) 11/27/2010   Dyslipidemia  11/01/2010   [place] 08/29/2010   Labile hypertension 08/29/2010   MUSCLE CRAMPS, FOOT 06/10/2010   OTHER SLEEP DISTURBANCES 06/10/2010   ANXIETY, SITUATIONAL 05/08/2010   VERTIGO, POSITIONAL 03/15/2010   VITAMIN D DEFICIENCY 02/18/2010   OBESITY 02/18/2010   ALLERGIC RHINITIS 02/18/2010   PLANTAR FASCIITIS 02/18/2010   TWITCHING 06/11/2009   NUMBNESS, HAND 06/11/2009   CERVICAL STRAIN, ACUTE 06/11/2009   OTHER MALAISE AND FATIGUE 07/09/2007   HYPERTENSION 01/28/2007   Hypothyroidism 11/11/2006   COMMON MIGRAINE 11/11/2006   GERD 11/11/2006   FIBROMYALGIA 11/11/2006    REFERRING DIAG: K64.9 (ICD-10-CM) - Hemorrhoids, unspecified hemorrhoid type N81.4 (ICD-10-CM) - Cystocele with prolapse  THERAPY DIAG:  Abnormal posture  Muscle weakness (generalized)  Other muscle spasm  Unspecified lack of  coordination  Rationale for Evaluation and Treatment Rehabilitation  PERTINENT HISTORY: Breast cancer dx 05/22/20, fibromyalgia, anal fissure, G6P6  PRECAUTIONS: hx breast cancer  SUBJECTIVE:                                                                                                                                                                                      SUBJECTIVE STATEMENT:    PAIN:  Are you having pain? No and Yes: NPRS scale: 3/10 Pain location: low back, bil posterior hips Pain description: uncomfortable Aggravating factors: prolonged standing, lifting, exercises, prolonged sitting Relieving factors: exercises, movement   03/24/22 SUBJECTIVE STATEMENT: Pt states that when she has to go to the bathroom, she really has to go. She has a drive to [place] and is concerned. She does wear pessary, but has to take it out when she has bowel movement. Patient does states that she has breast pain - she is going to MD tomorrow. 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  Fluid intake: Yes: she limits fluid to some extent to stop urgency; she does drink 11 glasses of water a day and herbal tea     PAIN:  Are you having pain? Yes DDD NPRS scale: 1/10 Pain location:  low back   Pain type: aching Pain description: intermittent    Aggravating factors: not doing exercises Relieving factors: exercises   PRECAUTIONS: Other: previous cancer dx   WEIGHT BEARING RESTRICTIONS: No   FALLS:  Has patient fallen in last 6 months? No   LIVING ENVIRONMENT: Lives with: lives with their family Lives in: House/apartment     OCCUPATION: reitred   PLOF: Independent   PATIENT GOALS: improve urgency/leaking   PERTINENT HISTORY:  Breast cancer dx 05/22/20, fibromyalgia, anal fissure, G6P6 Sexual abuse: No   BOWEL MOVEMENT: Pain with bowel movement: No Type of bowel movement:Frequency 2x/day, has to take pessary out  and Strain No Fully empty rectum: Yes: - Leakage:  No Pads: No Fiber supplement: Yes: psyllium husk   URINATION: Pain with urination: No Fully empty bladder: Yes: double voids Stream: Strong Urgency: Yes: if she does not go right away she will leak Frequency: every hour and a half - two hours Leakage: Urge to void, Walking to the bathroom, Coughing, Sneezing, and Laughing Pads: Yes: small pad   INTERCOURSE: Pain with intercourse:  no pain Ability to have vaginal penetration:  Yes: - Climax: - Marinoff Scale: 0/3   PREGNANCY: Vaginal deliveries 6 Tearing Yes: episiotomy C-section deliveries 0 Currently pregnant No   PROLAPSE: Cystocele pressure     OBJECTIVE:   05/19/22: PALPATION:   General  abdominal tenderness at midline just inferior to strenum                 External Perineal Exam dryness/redness                             Internal Pelvic Floor WNL   Patient confirms identification and approves PT to assess internal pelvic floor and treatment Yes   PELVIC MMT:   MMT eval  Vaginal   Internal Anal Sphincter    External Anal Sphincter    Puborectalis    Diastasis Recti 1 finger width separation with mild distortion   (Blank rows = not tested)         TONE: low   PROLAPSE: Grade 3 anterior vaginal wall laxity without pessary in   03/20/22:  COGNITION: Overall cognitive status: Within functional limits for tasks assessed                          SENSATION: Light touch: Appears intact other than tingling in bil LE Proprioception: Appears intact    MUSCLE LENGTH:     FUNCTIONAL TESTS:      GAIT:   Comments: WNL   POSTURE: rounded shoulders, forward head, decreased lumbar lordosis, and posterior pelvic tilt       LUMBARAROM/PROM:   A/PROM A/PROM  eval  Flexion    Extension    Right lateral flexion    Left lateral flexion    Right rotation    Left rotation     (Blank rows = not tested)       LOWER EXTREMITY MMT:     PALPATION:   General  abdominal tenderness at midline just  inferior to strenum                 External Perineal Exam dryness/redness                             Internal Pelvic Floor WNL   Patient confirms identification and approves PT to assess internal pelvic floor and treatment Yes   PELVIC MMT:   MMT eval  Vaginal 1-2/5, varying coordination with notable abdominal bracing and LE contraction; 3 second endurance; 6 repeat contractions with poor motor control  Internal Anal Sphincter    External Anal Sphincter    Puborectalis    Diastasis Recti 1 finger width separation with mild distortion   (Blank rows = not tested)         TONE: low   PROLAPSE: Grade 3 anterior vaginal wall laxity without pessary in   TODAY'S TREATMENT 07/15/22 Neuromuscular re-education: Side lying UE ball press 10x bil Supine  UE ball press 10x Bridge with march 10x Seated march 2 x 10 3lbs anteiror weight hold Therapeutic activities: Seated overhead press 5lbs 10x, single arm Seated deadlift 15lbs 10x Standing deadlift 15lbs 10x Standing deadlift 25lbs 5x    TREATMENT 07/08/22 Neuromuscular re-education: Side lying UE ball press 10x bil Supine reverse table top on red ball, UE ball press 2 x 10 Supine reverse table top on red ball, UE chop with ball press 10x bil Reverse table top on red ball lower trunk rotations Therapeutic activities: Overhead press single arm 3lb Squats to table 2 x 10   TREATMENT 07/01/22 Neuromuscular re-education: Seated ball squeeze with bil UE 2 x 10 Seated ball squeeze with 2 lb UE lift, 2 x 10 Seated D2/chop with ball squeeze 10x bil 2lbs Exercises: Seated piriformis stretch 60 seconds bil Pelvic tilts 2 x 10  PATIENT EDUCATION:  Education details: see above self-care Person educated: Patient Education method: Explanation, Demonstration, Tactile cues, Verbal cues, and Handouts Education comprehension: verbalized understanding   HOME EXERCISE PROGRAM: [id number]   ASSESSMENT:   CLINICAL IMPRESSION: Pt  continues to do well with minimal pad usage and getting very close to consistent trips every 2 hours. She did well with all core progressions, challenging coordination with more functional activities. She was encouraged that lifting is very safe for low back and pelvic floor as long as she is being mindful of form and pressure management. She is in a great position to D/C next session due to progress. She will continue to benefit from skilled PT intervention in order to improve pelvic floor strength and coordination, begin and progress pelvic floor/core functional strengthening program, and decrease urinary urgency/incontinence in order to improve QOL.    OBJECTIVE IMPAIRMENTS: decreased activity tolerance, decreased coordination, decreased endurance, decreased strength, increased fascial restrictions, increased muscle spasms, impaired tone, postural dysfunction, and pain.    ACTIVITY LIMITATIONS: continence   PARTICIPATION LIMITATIONS: community activity   PERSONAL FACTORS: 1-2 comorbidities: Breast cancer dx 05/22/20, fibromyalgia, anal fissure, G6P6  are also affecting patient's functional outcome.    REHAB POTENTIAL: Good   CLINICAL DECISION MAKING: Stable/uncomplicated   EVALUATION COMPLEXITY: Low     GOALS: Goals reviewed with patient? Yes   SHORT TERM GOALS: Target date: 04/21/2022 - updated 04/28/22 - updated 05/19/21 - updated 06/24/22   Pt will be independent with HEP.    Baseline: Goal status: MET 04/28/22   2.  Pt will be independent with the knack, urge suppression technique, and double voiding in order to improve bladder habits and decrease urinary incontinence.    Baseline:  Goal status: MET 05/19/22   3.  Pt will be able to correctly perform diaphragmatic breathing and appropriate pressure management in order to prevent worsening vaginal wall laxity and improve pelvic floor A/ROM.    Baseline:  Goal status: MET 05/19/22       LONG TERM GOALS: Target date: 05/19/2022 -  updated 04/28/22 - updated 05/19/21 new target 07/28/2022 updated 06/24/22   Pt will be independent with advanced HEP.    Baseline:  Goal status: IN PROGRESS   2.  Pt will be independent with the knack, urge suppression technique, and double voiding in order to improve bladder habits and decrease urinary incontinence.    Baseline:  Goal status: MET 05/19/22  3.  Pt will be able to go 2-3 hours in between voids without urgency or incontinence in order to improve QOL and perform all functional activities with less difficulty.    Baseline:  most of the time she is getting to 2 hours, but sometimes leaking on the way to the bathroom Goal status: IN PROGRESS   4.  Pt will be able to go on long car trips without having to stop frequently to urinate.  Baseline: improving  Goal status:IN PROGRESS   5. Pt will report no episodes of urinary incontinence in order to improve confidence in community activities and personal hygiene.    Baseline:  Goal status: IN PROGRESS       PLAN:   PT FREQUENCY: 1x/week   PT DURATION: 8 weeks   PLANNED INTERVENTIONS: Therapeutic exercises, Therapeutic activity, Neuromuscular re-education, Balance training, Gait training, Patient/Family education, Self Care, Joint mobilization, Dry Needling, Biofeedback, and Manual therapy   PLAN FOR NEXT SESSION: D/C next session   Heather Roberts, PT, DPT03/09/2410:04 PM

## 2022-07-17 DIAGNOSIS — R69 Illness, unspecified: Secondary | ICD-10-CM | POA: Diagnosis not present

## 2022-07-17 DIAGNOSIS — F6089 Other specific personality disorders: Secondary | ICD-10-CM | POA: Diagnosis not present

## 2022-07-22 ENCOUNTER — Ambulatory Visit: Payer: Medicare HMO

## 2022-07-22 DIAGNOSIS — R293 Abnormal posture: Secondary | ICD-10-CM

## 2022-07-22 DIAGNOSIS — M6281 Muscle weakness (generalized): Secondary | ICD-10-CM

## 2022-07-22 DIAGNOSIS — R279 Unspecified lack of coordination: Secondary | ICD-10-CM | POA: Diagnosis not present

## 2022-07-22 DIAGNOSIS — M62838 Other muscle spasm: Secondary | ICD-10-CM

## 2022-07-22 NOTE — Therapy (Signed)
OUTPATIENT PHYSICAL THERAPY TREATMENT NOTE   Patient Name: [name] MRN: [id number] DOB:1955-05-28, 67 y.o., female Today's Date: 07/22/2022  PCP: Burnis Medin, MD REFERRING PROVIDER: Vladimir Crofts, PA-C  END OF SESSION:   PT End of Session - 07/22/22 1058     Visit Number 11    Date for PT Re-Evaluation 07/28/22    Authorization Type [employer]    PT Start Time 1100    PT Stop Time 1140    PT Time Calculation (min) 40 min    Activity Tolerance Patient tolerated treatment well    Behavior During Therapy WFL for tasks assessed/performed                  Past Medical History:  Diagnosis Date   Anal fissure    Atrial septal aneurysm / if pfo  echo 6 13  10/23/2011   Breast cancer ([place])    Cataract    both eyes   Depression    Fatty liver    "pre fatty liver"   Fibromyalgia    Headache(784.0)    hx of migraines when younger   Heart palpitations    hx with normal holter event monitoring   Hx: UTI (urinary tract infection)    Hyperlipidemia    Hypertension    Hypothyroidism    Obesity    Personal history of chemotherapy    Personal history of radiation therapy    Pneumonia 1972   hx of   Polyp of colon    Serrated adenoma of colon 08/2012   Skin cancer    basal, squamous cell   Sleep apnea    uses cpap   Past Surgical History:  Procedure Laterality Date   BREAST LUMPECTOMY     BREAST LUMPECTOMY WITH RADIOACTIVE SEED AND SENTINEL LYMPH NODE BIOPSY Right 07/26/2020   Procedure: RIGHT BREAST LUMPECTOMY WITH RADIOACTIVE SEED AND SENTINEL LYMPH NODE BIOPSY;  Surgeon: Jovita Kussmaul, MD;  Location: [facility];  Service: General;  Laterality: Right;   BREAST LUMPECTOMY WITH RADIOACTIVE SEED LOCALIZATION Left 07/26/2020   Procedure: LEFT BREAST LUMPECTOMY X 2  WITH RADIOACTIVE SEED LOCALIZATION;  Surgeon: Jovita Kussmaul, MD;  Location: [facility];  Service: General;  Laterality: Left;   COLONOSCOPY     DILATION AND CURETTAGE OF [place] ARTHROSCOPY     both in past   POLYPECTOMY     [place] Left 07/07/2013   Procedure: LEFT SHOULDER MINI OPEN SUBACROMIAL DECOMPRESSION ROTATOR CUFF REPAIR AND POSSIBLE PATCH GRAFT ;  Surgeon: Johnn Hai, MD;  Location: [facility];  Service: Orthopedics;  Laterality: Left;  with interscaline block   SKIN CANCER EXCISION Bilateral    arm, legs, and chest   TONSILLECTOMY     Patient Active Problem List   Diagnosis Date Noted   Malignant neoplasm of upper-outer quadrant of right breast in female, estrogen receptor positive ([place]) 06/25/2020   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   Hepatic steatosis 11/27/2014   Hx of adenomatous colonic polyps 11/27/2014   Visit for preventive health examination 03/28/2014   Hyperlipidemia 03/28/2014   Left rotator cuff tear 07/07/2013   Tear of left rotator cuff 07/07/2013   Rhinitis 06/01/2013   Chest wall deformity 04/12/2012   Atrial septal aneurysm / if pfo  echo 6 13  10/23/2011   OSA (obstructive sleep apnea) 11/27/2010   Dyslipidemia  11/01/2010   [place] 08/29/2010   Labile hypertension 08/29/2010   MUSCLE CRAMPS, FOOT 06/10/2010   OTHER SLEEP DISTURBANCES 06/10/2010   ANXIETY, SITUATIONAL 05/08/2010   VERTIGO, POSITIONAL 03/15/2010   VITAMIN D DEFICIENCY 02/18/2010   OBESITY 02/18/2010   ALLERGIC RHINITIS 02/18/2010   PLANTAR FASCIITIS 02/18/2010   TWITCHING 06/11/2009   NUMBNESS, HAND 06/11/2009   CERVICAL STRAIN, ACUTE 06/11/2009   OTHER MALAISE AND FATIGUE 07/09/2007   HYPERTENSION 01/28/2007   Hypothyroidism 11/11/2006   COMMON MIGRAINE 11/11/2006   GERD 11/11/2006   FIBROMYALGIA 11/11/2006    REFERRING DIAG: K64.9 (ICD-10-CM) - Hemorrhoids, unspecified hemorrhoid type N81.4 (ICD-10-CM) - Cystocele with prolapse  THERAPY DIAG:  Abnormal posture  Muscle weakness (generalized)  Other muscle spasm  Unspecified lack of  coordination  Rationale for Evaluation and Treatment Rehabilitation  PERTINENT HISTORY: Breast cancer dx 05/22/20, fibromyalgia, anal fissure, G6P6  PRECAUTIONS: hx breast cancer  SUBJECTIVE:                                                                                                                                                                                      SUBJECTIVE STATEMENT: Pt states that she is doing very well. She reports that when she works on everything diligently, she does not have any issues. She has had one time where she went to the bathroom early this last week. She still wore a panty liner to church last weekend. She did well with lifting projects this week with no low back pain. She does feel prepared to D/C today.    PAIN:  Are you having pain? No and Yes: NPRS scale: 3/10 Pain location: low back, bil posterior hips Pain description: uncomfortable Aggravating factors: prolonged standing, lifting, exercises, prolonged sitting Relieving factors: exercises, movement   03/24/22 SUBJECTIVE STATEMENT: Pt states that when she has to go to the bathroom, she really has to go. She has a drive to [place] and is concerned. She does wear pessary, but has to take it out when she has bowel movement. Patient does states that she has breast pain - she is going to MD tomorrow. 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  Fluid intake: Yes: she limits fluid to some extent to stop urgency; she does drink 11 glasses of water a day and herbal tea     PAIN:  Are you having pain? Yes DDD NPRS scale: 1/10 Pain location:  low back   Pain type: aching Pain description: intermittent    Aggravating factors: not doing exercises Relieving factors: exercises   PRECAUTIONS: Other: previous cancer dx   WEIGHT BEARING RESTRICTIONS: No   FALLS:  Has patient fallen in  last 6 months? No   LIVING ENVIRONMENT: Lives with: lives with their family Lives in: House/apartment      OCCUPATION: reitred   PLOF: Independent   PATIENT GOALS: improve urgency/leaking   PERTINENT HISTORY:  Breast cancer dx 05/22/20, fibromyalgia, anal fissure, G6P6 Sexual abuse: No   BOWEL MOVEMENT: Pain with bowel movement: No Type of bowel movement:Frequency 2x/day, has to take pessary out and Strain No Fully empty rectum: Yes: - Leakage: No Pads: No Fiber supplement: Yes: psyllium husk   URINATION: Pain with urination: No Fully empty bladder: Yes: double voids Stream: Strong Urgency: Yes: if she does not go right away she will leak Frequency: every hour and a half - two hours Leakage: Urge to void, Walking to the bathroom, Coughing, Sneezing, and Laughing Pads: Yes: small pad   INTERCOURSE: Pain with intercourse:  no pain Ability to have vaginal penetration:  Yes: - Climax: - Marinoff Scale: 0/3   PREGNANCY: Vaginal deliveries 6 Tearing Yes: episiotomy C-section deliveries 0 Currently pregnant No   PROLAPSE: Cystocele pressure     OBJECTIVE:   05/19/22: PALPATION:   General  abdominal tenderness at midline just inferior to strenum                 External Perineal Exam dryness/redness                             Internal Pelvic Floor WNL   Patient confirms identification and approves PT to assess internal pelvic floor and treatment Yes   PELVIC MMT:   MMT eval  Vaginal   Internal Anal Sphincter    External Anal Sphincter    Puborectalis    Diastasis Recti 1 finger width separation with mild distortion   (Blank rows = not tested)         TONE: low   PROLAPSE: Grade 3 anterior vaginal wall laxity without pessary in   03/20/22:  COGNITION: Overall cognitive status: Within functional limits for tasks assessed                          SENSATION: Light touch: Appears intact other than tingling in bil LE Proprioception: Appears intact    MUSCLE LENGTH:     FUNCTIONAL TESTS:      GAIT:   Comments: WNL   POSTURE: rounded shoulders,  forward head, decreased lumbar lordosis, and posterior pelvic tilt       LUMBARAROM/PROM:   A/PROM A/PROM  eval  Flexion    Extension    Right lateral flexion    Left lateral flexion    Right rotation    Left rotation     (Blank rows = not tested)       LOWER EXTREMITY MMT:     PALPATION:   General  abdominal tenderness at midline just inferior to strenum                 External Perineal Exam dryness/redness                             Internal Pelvic Floor WNL   Patient confirms identification and approves PT to assess internal pelvic floor and treatment Yes   PELVIC MMT:   MMT eval  Vaginal 1-2/5, varying coordination with notable abdominal bracing and LE contraction; 3 second endurance; 6 repeat contractions with poor motor control  Internal  Anal Sphincter    External Anal Sphincter    Puborectalis    Diastasis Recti 1 finger width separation with mild distortion   (Blank rows = not tested)         TONE: low   PROLAPSE: Grade 3 anterior vaginal wall laxity without pessary in   TODAY'S TREATMENT 07/22/22 Manual Therapy: Trigger Point Dry-Needling  Treatment instructions: Expect mild to moderate muscle soreness. S/S of pneumothorax if dry needled over a lung field, and to seek immediate medical attention should they occur. Patient verbalized understanding of these instructions and education.  Patient Consent Given: Yes Education handout provided: Previously provided Muscles treated: Bil glutes Electrical stimulation performed: No Parameters: N/A Treatment response/outcome: twitch response/release  Soft tissue mobilization to lumbar paraspinals and bil hips Therapeutic activities: Review lifestyle intervention Review HEP   TREATMENT 07/15/22 Neuromuscular re-education: Side lying UE ball press 10x bil Supine UE ball press 10x Bridge with march 10x Seated march 2 x 10 3lbs anteiror weight hold Therapeutic activities: Seated overhead press 5lbs 10x,  single arm Seated deadlift 15lbs 10x Standing deadlift 15lbs 10x Standing deadlift 25lbs 5x    TREATMENT 07/08/22 Neuromuscular re-education: Side lying UE ball press 10x bil Supine reverse table top on red ball, UE ball press 2 x 10 Supine reverse table top on red ball, UE chop with ball press 10x bil Reverse table top on red ball lower trunk rotations Therapeutic activities: Overhead press single arm 3lb Squats to table 2 x 10   PATIENT EDUCATION:  Education details: see above self-care Person educated: Patient Education method: Explanation, Demonstration, Tactile cues, Verbal cues, and Handouts Education comprehension: verbalized understanding   HOME EXERCISE PROGRAM: [id number]   ASSESSMENT:   CLINICAL IMPRESSION: Pt has done very well in pelvic floor physical therapy. She has met all goals and not had any episodes of urinary incontinence in the last several weeks. She is not having to use a pad anymore except when she is at church (panty liner for safety). She feels confident with HEP and all lifestyle intervention. Due to progress and having met goals, she is prepared to D/C skilled PT intervention at this time.    OBJECTIVE IMPAIRMENTS: decreased activity tolerance, decreased coordination, decreased endurance, decreased strength, increased fascial restrictions, increased muscle spasms, impaired tone, postural dysfunction, and pain.    ACTIVITY LIMITATIONS: continence   PARTICIPATION LIMITATIONS: community activity   PERSONAL FACTORS: 1-2 comorbidities: Breast cancer dx 05/22/20, fibromyalgia, anal fissure, G6P6  are also affecting patient's functional outcome.    REHAB POTENTIAL: Good   CLINICAL DECISION MAKING: Stable/uncomplicated   EVALUATION COMPLEXITY: Low     GOALS: Goals reviewed with patient? Yes   SHORT TERM GOALS: Target date: 04/21/2022 - updated 04/28/22 - updated 05/19/21 - updated 06/24/22   Pt will be independent with HEP.    Baseline: Goal  status: MET 04/28/22   2.  Pt will be independent with the knack, urge suppression technique, and double voiding in order to improve bladder habits and decrease urinary incontinence.    Baseline:  Goal status: MET 05/19/22   3.  Pt will be able to correctly perform diaphragmatic breathing and appropriate pressure management in order to prevent worsening vaginal wall laxity and improve pelvic floor A/ROM.    Baseline:  Goal status: MET 05/19/22       LONG TERM GOALS: Target date: 05/19/2022 - updated 04/28/22 - updated 05/19/21 new target 07/28/2022 updated 06/24/22 - updated 07/22/22   Pt will be independent with advanced HEP.  Baseline:  Goal status: MET 07/22/22   2.  Pt will be independent with the knack, urge suppression technique, and double voiding in order to improve bladder habits and decrease urinary incontinence.    Baseline:  Goal status: MET 05/19/22  3.  Pt will be able to go 2-3 hours in between voids without urgency or incontinence in order to improve QOL and perform all functional activities with less difficulty.    Baseline: most of the time she is getting to 2 hours, but sometimes leaking on the way to the bathroom Goal status: MET 07/22/22   4.  Pt will be able to go on long car trips without having to stop frequently to urinate.  Baseline: improving  Goal status: MET 07/22/22   5. Pt will report no episodes of urinary incontinence in order to improve confidence in community activities and personal hygiene.    Baseline:  Goal status: MET 07/22/22       PLAN:   PT FREQUENCY: D/C   PT DURATION: D/C   PLANNED INTERVENTIONS: D/C   PLAN FOR NEXT SESSION: D/C  PHYSICAL THERAPY DISCHARGE SUMMARY  Visits from Start of Care: 11  Current functional level related to goals / functional outcomes: Independent   Remaining deficits: See above   Education / Equipment: HEP   Patient agrees to discharge. Patient goals were met. Patient is being discharged due to  meeting the stated rehab goals.  Heather Roberts, PT, DPT03/04/2410:43 AM

## 2022-07-26 ENCOUNTER — Other Ambulatory Visit: Payer: Self-pay | Admitting: Internal Medicine

## 2022-07-31 DIAGNOSIS — F6089 Other specific personality disorders: Secondary | ICD-10-CM | POA: Diagnosis not present

## 2022-07-31 DIAGNOSIS — R69 Illness, unspecified: Secondary | ICD-10-CM | POA: Diagnosis not present

## 2022-08-11 DIAGNOSIS — R69 Illness, unspecified: Secondary | ICD-10-CM | POA: Diagnosis not present

## 2022-08-11 DIAGNOSIS — F6089 Other specific personality disorders: Secondary | ICD-10-CM | POA: Diagnosis not present

## 2022-08-13 ENCOUNTER — Telehealth: Payer: Self-pay

## 2022-08-13 NOTE — Progress Notes (Unsigned)
Care Management & Coordination Services Pharmacy Team  Reason for Encounter: Hypertension  Contacted patient to discuss hypertension disease state. {US HC Outreach:[id number]}    Current antihypertensive regimen:  HCTZ 25mg  1 tab daily (taking prn for swelling)  Amlodipine 10mg  1/2 tab in morning and 1/2 tab qpm  Patient verbally confirms she is taking the above medications as directed. {yes/no:20286}  How often are you checking your Blood Pressure? {CHL HP BP Monitoring Frequency:[phone]}  she checks her blood pressure {timing:25218} {before/after:25217} taking her medication.  Current home BP readings: ***  DATE:             BP               PULSE   Wrist or arm cuff: Caffeine intake: Salt intake: OTC medications including pseudoephedrine or NSAIDs?  Any readings above 180/100? {yes/no:20286} If yes any symptoms of hypertensive emergency? {hypertensive emergency symptoms:25354}  What recent interventions/DTPs have been made by any provider to improve Blood Pressure control since last CPP Visit: ***  Any recent hospitalizations or ED visits since last visit with CPP? {yes/no:20286}  What diet changes have been made to improve Blood Pressure Control?  ***  What exercise is being done to improve your Blood Pressure Control?  ***  Follow up in Aug [place]?  Adherence Review: Is the patient currently on ACE/ARB medication? No Does the patient have >5 day gap between last estimated fill dates? No  Star Rating Drugs:  None   Care Gaps: COVID Booster - Overdue PNA Vaccine - Overdue Zoster Vaccine - Overdue AWV- 05/28/21 Pharm Visit :***   Chart Updates: Recent office visits:  None  Recent consult visits:  3/122/24 Jule Economy, PT - Patient presented for Abnormal posture and other concerns. No medication changes.   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 No medication changes.    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 No medication changes.   Hospital visits:  None in previous 6 months  Medications: Outpatient Encounter Medications as of 08/13/2022  Medication Sig Note   amLODipine (NORVASC) 10 MG tablet Take 10 mg by mouth daily. 06/20/2022: Taking 1/2 tab qam and 1/2 tab qpm   ARMOUR THYROID PO     Ascorbic Acid (VITAMIN C) 1000 MG tablet Take 1,000 mg by mouth daily.    b complex vitamins capsule Take 1 capsule by mouth daily.    Bioflavonoid Products (QUERCETIN COMPLEX IMMUNE PO) Take 1 capsule by mouth as needed.    Cholecalciferol (VITAMIN D) 50 MCG (2000 UT) tablet Take 4,000 Units by mouth daily.    doxycycline (VIBRA-TABS) 100 MG tablet Take 1 tablet (100 mg total) by mouth 2 (two) times daily. If needed for sinusitis    hydrochlorothiazide (HYDRODIURIL) 25 MG tablet Take 12.5 mg by mouth daily. (Patient not taking: Reported on 02/28/2022) 06/20/2022: Takes prn for ankle swelling   hydrocortisone (ANUSOL-HC) 2.5 % rectal cream Place 1 Application rectally 2 (two) times daily. (Patient not taking: Reported on 03/10/2022)    MAGNESIUM CARBONATE PO Take 1 tablet by mouth daily.    melatonin 5 MG TABS Take 5 mg by mouth at bedtime.    Menaquinone-7 (VITAMIN K2) 100 MCG CAPS Take 100 mcg by mouth daily.    Multiple Vitamin (MULTIVITAMIN) tablet Take 1 tablet by mouth daily.    OVER THE COUNTER MEDICATION Take 2 capsules  by mouth in the morning and at bedtime. Infla Med    OVER THE COUNTER MEDICATION Take 1 capsule by mouth daily. Essential Pro    potassium chloride SA (KLOR-CON M) 20 MEQ tablet TAKE ONE TABLET BY MOUTH ONE TIME DAILY    Probiotic Product (PROBIOTIC PO) Take 1 capsule by mouth daily.    UBIQUINOL PO Take by mouth.    ZINC GLUCONATE PO Take 10 mg by mouth daily.    No facility-administered encounter medications on file as of 08/13/2022.    Recent Office Vitals: BP  Readings from Last 3 Encounters:  03/25/22 (!) 153/76  03/24/22 137/74  03/10/22 (!) 148/76   Pulse Readings from Last 3 Encounters:  03/25/22 81  03/24/22 86  03/10/22 72    Wt Readings from Last 3 Encounters:  06/02/22 208 lb 4 oz (94.5 kg)  03/25/22 201 lb (91.2 kg)  03/24/22 202 lb (91.6 kg)     Kidney Function Lab Results  Component Value Date/Time   CREATININE 0.68 03/25/2022 09:38 AM   CREATININE 0.77 12/23/2021 10:32 AM   CREATININE 0.86 02/06/2011 09:37 AM   GFR 86.85 12/30/2018 03:50 PM   GFRNONAA >60 03/25/2022 09:38 AM   GFRNONAA >60 02/06/2011 09:37 AM   GFRAA 95 01/09/2020 12:00 AM   GFRAA >60 02/06/2011 09:37 AM       Latest Ref Rng & Units 03/25/2022    9:38 AM 12/23/2021   10:32 AM 10/22/2021    8:27 AM  BMP  Glucose 70 - 99 mg/dL 85  109  93   BUN 8 - 23 mg/dL 21  20  15    Creatinine 0.44 - 1.00 mg/dL 0.68  0.77  0.73   Sodium 135 - 145 mmol/L 140  138  142   Potassium 3.5 - 5.1 mmol/L 4.4  3.9  3.7   Chloride 98 - 111 mmol/L 104  103  107   CO2 22 - 32 mmol/L 31  30  28    Calcium 8.9 - 10.3 mg/dL 9.6  9.8  9.7        Ned Clines CMA Clinical Pharmacist Assistant [phone]

## 2022-08-18 DIAGNOSIS — G4733 Obstructive sleep apnea (adult) (pediatric): Secondary | ICD-10-CM | POA: Diagnosis not present

## 2022-08-21 DIAGNOSIS — M531 Cervicobrachial syndrome: Secondary | ICD-10-CM | POA: Diagnosis not present

## 2022-08-21 DIAGNOSIS — M9902 Segmental and somatic dysfunction of thoracic region: Secondary | ICD-10-CM | POA: Diagnosis not present

## 2022-08-21 DIAGNOSIS — M9901 Segmental and somatic dysfunction of cervical region: Secondary | ICD-10-CM | POA: Diagnosis not present

## 2022-08-21 DIAGNOSIS — M9903 Segmental and somatic dysfunction of lumbar region: Secondary | ICD-10-CM | POA: Diagnosis not present

## 2022-08-26 DIAGNOSIS — R69 Illness, unspecified: Secondary | ICD-10-CM | POA: Diagnosis not present

## 2022-08-26 DIAGNOSIS — F6089 Other specific personality disorders: Secondary | ICD-10-CM | POA: Diagnosis not present

## 2022-09-02 DIAGNOSIS — N959 Unspecified menopausal and perimenopausal disorder: Secondary | ICD-10-CM | POA: Diagnosis not present

## 2022-09-02 DIAGNOSIS — E559 Vitamin D deficiency, unspecified: Secondary | ICD-10-CM | POA: Diagnosis not present

## 2022-09-02 DIAGNOSIS — I10 Essential (primary) hypertension: Secondary | ICD-10-CM | POA: Diagnosis not present

## 2022-09-02 DIAGNOSIS — E039 Hypothyroidism, unspecified: Secondary | ICD-10-CM | POA: Diagnosis not present

## 2022-09-02 DIAGNOSIS — R7989 Other specified abnormal findings of blood chemistry: Secondary | ICD-10-CM | POA: Diagnosis not present

## 2022-09-02 DIAGNOSIS — R5383 Other fatigue: Secondary | ICD-10-CM | POA: Diagnosis not present

## 2022-09-09 DIAGNOSIS — F6089 Other specific personality disorders: Secondary | ICD-10-CM | POA: Diagnosis not present

## 2022-09-11 DIAGNOSIS — M531 Cervicobrachial syndrome: Secondary | ICD-10-CM | POA: Diagnosis not present

## 2022-09-11 DIAGNOSIS — M9903 Segmental and somatic dysfunction of lumbar region: Secondary | ICD-10-CM | POA: Diagnosis not present

## 2022-09-11 DIAGNOSIS — M9901 Segmental and somatic dysfunction of cervical region: Secondary | ICD-10-CM | POA: Diagnosis not present

## 2022-09-11 DIAGNOSIS — M9902 Segmental and somatic dysfunction of thoracic region: Secondary | ICD-10-CM | POA: Diagnosis not present

## 2022-09-12 ENCOUNTER — Encounter: Payer: Self-pay | Admitting: Family Medicine

## 2022-09-12 ENCOUNTER — Ambulatory Visit (INDEPENDENT_AMBULATORY_CARE_PROVIDER_SITE_OTHER): Payer: Medicare HMO | Admitting: Family Medicine

## 2022-09-12 VITALS — BP 136/74 | HR 95 | Temp 98.5°F | Resp 16 | Ht 65.5 in | Wt 210.4 lb

## 2022-09-12 DIAGNOSIS — W57XXXA Bitten or stung by nonvenomous insect and other nonvenomous arthropods, initial encounter: Secondary | ICD-10-CM | POA: Diagnosis not present

## 2022-09-12 DIAGNOSIS — H9313 Tinnitus, bilateral: Secondary | ICD-10-CM | POA: Diagnosis not present

## 2022-09-12 DIAGNOSIS — S0096XA Insect bite (nonvenomous) of unspecified part of head, initial encounter: Secondary | ICD-10-CM | POA: Diagnosis not present

## 2022-09-12 DIAGNOSIS — R519 Headache, unspecified: Secondary | ICD-10-CM

## 2022-09-12 MED ORDER — DOXYCYCLINE HYCLATE 100 MG PO TABS: 200.0000 mg | ORAL_TABLET | Freq: Once | ORAL | 0 refills | Status: AC

## 2022-09-12 NOTE — Patient Instructions (Addendum)
A few things to remember from today's visit:  Tick bite of head, initial encounter - Plan: doxycycline (VIBRA-TABS) 100 MG tablet Did not noted significant changes on area. Monitor for new symptoms. Doxycycline 200 mg once.  Do not use My Chart to request refills or for acute issues that need immediate attention. If you send a my chart message, it may take a few days to be addressed, specially if I am not in the office.  Please be sure medication list is accurate. If a new problem present, please set up appointment sooner than planned today.

## 2022-09-12 NOTE — Progress Notes (Unsigned)
ACUTE VISIT Chief Complaint  Patient presents with   Tick Removal    Happened on 4/21, started to have some soreness/pain at temple.    HPI: Ms.Samella H Shreiner is a 66 y.o. female with PMHx significant for hypertension, hypothyroidism, fibromyalgia,breast cancer,GERD, and OSA here today complaining of pain and soreness on her left temple,noted yesterday. She attributes symptom to recent tick bite in same area, which occurred on April 21st.  She reports that the tick was not engorged or embedded. She suspects it got on her skin same day she found it. She removed tick easily with her fingers, noted a small amount of skin was attached to its mouth when she removed it. She recalls that the tick was oval and brown in appearance, she thinks he may have been a dog tick.  She has not used any OTC treatments.  She has not observed any rash or other skin changes. She mentions experiencing fatigue, which she attributes to thyroid disease. Reports that her Armour Thyroid was recently adjusted.  She denies any fever, sore throat, unusual headache, or numbness.  Review of Systems  Constitutional:  Negative for activity change, appetite change and chills.  Eyes:  Negative for redness and visual disturbance.  Respiratory:  Negative for cough, shortness of breath and wheezing.   Gastrointestinal:  Negative for abdominal pain and nausea.  Musculoskeletal:  Negative for gait problem and myalgias.  Skin:  Negative for rash.  Neurological:  Negative for facial asymmetry and weakness.  See other pertinent positives and negatives in HPI.  Current Outpatient Medications on File Prior to Visit  Medication Sig Dispense Refill   amLODipine (NORVASC) 10 MG tablet Take 10 mg by mouth daily.     ARMOUR THYROID PO      Ascorbic Acid (VITAMIN C) 1000 MG tablet Take 1,000 mg by mouth daily.     b complex vitamins capsule Take 1 capsule by mouth daily.     Bioflavonoid Products (QUERCETIN COMPLEX IMMUNE PO)  Take 1 capsule by mouth as needed.     Cholecalciferol (VITAMIN D) 50 MCG (2000 UT) tablet Take 4,000 Units by mouth daily.     hydrochlorothiazide (HYDRODIURIL) 25 MG tablet Take 12.5 mg by mouth daily.     hydrocortisone (ANUSOL-HC) 2.5 % rectal cream Place 1 Application rectally 2 (two) times daily. 30 g 2   MAGNESIUM CARBONATE PO Take 1 tablet by mouth daily.     melatonin 5 MG TABS Take 5 mg by mouth at bedtime.     Menaquinone-7 (VITAMIN K2) 100 MCG CAPS Take 100 mcg by mouth daily.     Multiple Vitamin (MULTIVITAMIN) tablet Take 1 tablet by mouth daily.     OVER THE COUNTER MEDICATION Take 2 capsules by mouth in the morning and at bedtime. Infla Med     OVER THE COUNTER MEDICATION Take 1 capsule by mouth daily. Essential Pro     potassium chloride SA (KLOR-CON M) 20 MEQ tablet TAKE ONE TABLET BY MOUTH ONE TIME DAILY 90 tablet 0   Probiotic Product (PROBIOTIC PO) Take 1 capsule by mouth daily.     UBIQUINOL PO Take by mouth.     ZINC GLUCONATE PO Take 10 mg by mouth daily.     No current facility-administered medications on file prior to visit.   Past Medical History:  Diagnosis Date   Anal fissure    Atrial septal aneurysm / if pfo  echo 6 13  10/23/2011   Breast cancer (HCC)  Cataract    both eyes   Depression    Fatty liver    "pre fatty liver"   Fibromyalgia    Headache(784.0)    hx of migraines when younger   Heart palpitations    hx with normal holter event monitoring   Hx: UTI (urinary tract infection)    Hyperlipidemia    Hypertension    Hypothyroidism    Obesity    Personal history of chemotherapy    Personal history of radiation therapy    Pneumonia 1972   hx of   Polyp of colon    Serrated adenoma of colon 08/2012   Skin cancer    basal, squamous cell   Sleep apnea    uses cpap   Allergies  Allergen Reactions   Penicillins Itching, Swelling and Other (See Comments)    REACTION: swelling up as an adult-67 yrs old REACTION: swelling up as a child    Taxotere [Docetaxel] Anaphylaxis    08/30/20 Constricted throat sensation of throat and tongue swelling and chest pressure Docetaxel paused and given Pepcid 20 mg IV, Benadryl 25 mg IV, Ativan 0.5 mg IV x1.  Ativan 0.5 mg IV x1 was repeated after the restart of the patient's chemotherapy. The patient's symptoms abated and docetaxel restarted.  Then had a recurrence of constriction of throat and was given Ativan 0.5 mg IV x1 for total dose of 1 mg of Ativan.  She was able to complete her treatment with out any further issues or concerns.   Cefdinir Swelling, Itching and Rash    REACTION: Rash, swelling and itching   Cetirizine Other (See Comments)    unsure   Femara [Letrozole] Other (See Comments)    Pt reports severe muscle cramp and exhaustion.    Irbesartan Other (See Comments)    See 1 20 note  Heart burn , dizziness when taken with diuretic  See 1 20 note  Heart burn , dizziness when taken with diuretic    Molds & Smuts     Mold and mildew   Pegfilgrastim     Bad reaction- unspecified    Tylenol [Acetaminophen]     Pre fatty liver: does not takes these    Social History   Socioeconomic History   Marital status: Married    Spouse name: Not on file   Number of children: 6   Years of education: Not on file   Highest education level: Not on file  Occupational History   Occupation: TEACHER ASST    Employer: [employer]  Tobacco Use   Smoking status: Never   Smokeless tobacco: Never  Vaping Use   Vaping Use: Never used  Substance and Sexual Activity   Alcohol use: Yes    Comment: rare   Drug use: No   Sexual activity: Not on file  Other Topics Concern   Not on file  Social History Narrative   Teachers A[employer]  Not workking since last February . 15 left shoulder surgery    Married   Special educ   HH of 4   3 outside dogs, 8 goats, 40 chickens and quail. [place]   Job stresses   Ex husband passes away   Not working after  injur at school  Shoulder neck                Social Determinants of Health   Financial Resource Strain: Low Risk  (07/24/2021)   Overall Financial Resource  Strain (CARDIA)    Difficulty of Paying Living Expenses: Not hard at all  Food Insecurity: No Food Insecurity (05/28/2021)   Hunger Vital Sign    Worried About Running Out of Food in the Last Year: Never true    Ran Out of Food in the Last Year: Never true  Transportation Needs: No Transportation Needs (07/24/2021)   PRAPARE - Administrator, Civil Service (Medical): No    Lack of Transportation (Non-Medical): No  Physical Activity: Insufficiently Active (05/28/2021)   Exercise Vital Sign    Days of Exercise per Week: 5 days    Minutes of Exercise per Session: 20 min  Stress: No Stress Concern Present (05/28/2021)   [employer] of Occupational Health - Occupational Stress Questionnaire    Feeling of Stress : Not at all  Social Connections: Socially Integrated (05/28/2021)   Social Connection and Isolation Panel [NHANES]    Frequency of Communication with Friends and Family: More than three times a week    Frequency of Social Gatherings with Friends and Family: More than three times a week    Attends Religious Services: More than 4 times per year    Active Member of Clubs or Organizations: Yes    Attends Banker Meetings: More than 4 times per year    Marital Status: Married    Vitals:   09/12/22 0956  BP: 136/74  Pulse: 95  Resp: 16  Temp: 98.5 F (36.9 C)  SpO2: 97%   Body mass index is 34.48 kg/m.  Physical Exam Vitals and nursing note reviewed.  Constitutional:      General: She is not in acute distress.    Appearance: She is well-developed.  HENT:     Head: Normocephalic and atraumatic.   Eyes:     Conjunctiva/sclera: Conjunctivae normal.  Cardiovascular:     Rate and Rhythm: Normal rate and regular rhythm.     Heart sounds: No murmur heard. Pulmonary:     Effort: Pulmonary  effort is normal. No respiratory distress.     Breath sounds: Normal breath sounds.  Lymphadenopathy:     Cervical: No cervical adenopathy.  Skin:    General: Skin is warm.     Findings: No erythema or rash.  Neurological:     Mental Status: She is alert and oriented to person, place, and time.  Psychiatric:        Mood and Affect: Affect normal. Mood is anxious.   ASSESSMENT AND PLAN:  Ms. Fiacco was seen for left temple soreness and tick bite.  Tick bite of head, initial encounter No residual skin lesion on area. Based on hx the probability of complications is low. She is concerned about possibility of lyme disease, so prophylactic treatment recommended. Monitor for new symptoms. I do not think lab is needed at this time.  -     Doxycycline Hyclate; Take 2 tablets (200 mg total) by mouth once for 1 dose.  Dispense: 2 tablet; Refill: 0  Tenderness of temple region Around same area of tick bite with no skin lesions or changes. Continue monitoring for new symptoms. Instructed about warning signs.  Return if symptoms worsen or fail to improve.  Khristy Kalan G. [place], MD  [facility]. [place] office.

## 2022-09-21 NOTE — Progress Notes (Unsigned)
Patient Care Team: Panosh, [name], MD as PCP - General Richardean Chimera, MD as Consulting Physician (Obstetrics and Gynecology) Dr Massie Kluver, MD as Consulting Physician (Orthopedic Surgery) Meryl Dare, MD as Consulting Physician (Gastroenterology) Venita Lick, MD as Consulting Physician (Orthopedic Surgery) Donnelly Angelica, RN as Oncology Nurse Navigator Pershing Proud, RN as Oncology Nurse Navigator Griselda Miner, MD as Consulting Physician (General Surgery) Malachy Mood, MD as Consulting Physician (Hematology) Lonie Peak, MD as Attending Physician (Radiation Oncology) Elmon Else, MD as Consulting Physician (Dermatology) Caren Griffins, MD as Referring Physician (Cardiology) [name], NP as Nurse Practitioner (Nurse Practitioner) Sherrill Raring, [facility] (Pharmacist)   CHIEF COMPLAINT: 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   Oncology History Overview Note  Cancer Staging Ductal carcinoma in situ (DCIS) of left breast Staging form: Breast, AJCC 8th Edition - Clinical stage from 06/21/2020: Stage 0 (cTis (DCIS), cN0, cM0, G2, ER+, PR+, HER2: Not Assessed) - Signed by Malachy Mood, MD on 06/26/2020 Stage prefix: Initial diagnosis  Malignant neoplasm of upper-outer quadrant of right breast in female, estrogen receptor positive (HCC) Staging form: Breast, AJCC 8th Edition - Clinical stage from 06/21/2020: Stage IA (cT1b, cN0, cM0, G2, ER+, PR+, HER2-) - Signed by Malachy Mood, MD on 06/26/2020 Stage prefix: Initial diagnosis - Pathologic stage from 07/26/2020: Stage IA (pT1c, pN1a, cM0, G2, ER+, PR+, HER2-) - Signed by Malachy Mood, MD on 08/09/2020 Stage prefix: Initial diagnosis Nuclear grade: G2 Histologic grading system: 3 grade system Residual tumor (R): R0 - None    Malignant neoplasm of upper-outer quadrant of right breast in female, estrogen receptor positive (HCC)  06/08/2020 Mammogram   IMPRESSION: 1. 9 x 7 x 6 mm mass in the 12 o'clock  position of the right breast, 2cmfn with imaging features highly suspicious for malignancy. 2. 4 mm group of indeterminate calcifications in the 12 o'clock position of the left breast and 4 mm group of indeterminate calcifications in the 1 o'clock position of the left breast. Together, the groups span an area measuring 3.9 cm.   06/21/2020 Cancer Staging   Staging form: Breast, AJCC 8th Edition - Clinical stage from 06/21/2020: Stage IA (cT1b, cN0, cM0, G2, ER+, PR+, HER2-) - Signed by Malachy Mood, MD on 06/26/2020 Stage prefix: Initial diagnosis   06/21/2020 Initial Biopsy   Diagnosis 1. Breast, right, needle core biopsy, 12 oc - INVASIVE MAMMARY CARCINOMA - MAMMARY CARCINOMA IN SITU - SEE COMMENT 2. Breast, left, needle core biopsy, 12 oc - MAMMARY CARCINOMA IN-SITU WITH NECROSIS AND CALCIFICATIONS - SEE COMMENT 3. Breast, left, needle core biopsy, 1 oc - MAMMARY CARCINOMA IN-SITU WITH NECROSIS AND CALCIFICATIONS - SEE COMMENT Microscopic Comment 1. The biopsy material shows an infiltrative proliferation of cells with large vesicular nuclei with inconspicuous nucleoli, arranged linearly and in small clusters. Based on the biopsy, the carcinoma appears Nottingham grade 2 of 3.  Addendum: 1. E-cadherin is POSITIVE supporting a ductal origin. 2. E-cadherin is POSITIVE supporting a ductal origin.  3. E-cadherin is positive supporting a ductal origin. The focus is less pronounced on the deeper sections and in isolation would likely be considered atypical ductal hyperplasia.   06/21/2020 Receptors her2   1. PROGNOSTIC INDICATORS Results: IMMUNOHISTOCHEMICAL AND MORPHOMETRIC ANALYSIS PERFORMED MANUALLY The tumor cells are EQUIVOCAL for Her2 (2+). HER2 by FISH will be performed and the results reported separately Estrogen Receptor: 95%, POSITIVE, STRONG STAINING INTENSITY Progesterone Receptor: 40%, POSITIVE, MODERATE STAINING INTENSITY Proliferation  Marker Ki67: 10%  1. FLUORESCENCE  IN-SITU HYBRIDIZATION Results: GROUP 5: HER2 **NEGATIVE** Equivocal form of amplification of the HER2 gene was detected in the IHC 2+ tissue sample received from this individual. HER2 FISH was performed by a technologist and cell imaging and analysis on the [employer].   06/25/2020 Initial Diagnosis   Malignant neoplasm of upper-outer quadrant of right breast in female, estrogen receptor positive (HCC)   07/03/2020 Breast MRI   IMPRESSION: 1. Known RIGHT breast cancer, 12 o'clock axis, at anterior depth, measuring 1 cm greatest extent, manifesting as a spiculated enhancing mass on MRI, with associated biopsy clip. Expected post biopsy changes are seen within the adjacent outer RIGHT breast. 2. No evidence of additional multifocal or multicentric disease within the RIGHT breast. 3. Known LEFT breast DCIS within the slightly outer LEFT breast, at anterior depth, corresponding to the biopsy site labeled 1 o'clock axis, with associated enhancement only at the margins of the biopsy cavity measuring up to 5 mm greatest dimension. 4. Known LEFT breast DCIS within the upper central LEFT breast, at middle depth, corresponding to the biopsy site labeled 12 o'clock axis. Contiguous linear non-mass enhancement extends 2.3 cm superior-medial to the biopsy cavity, most likely post biopsy change but possibly contiguous extent of disease. 5. No evidence of additional multifocal or multicentric disease within the LEFT breast. 6. No evidence of metastatic lymphadenopathy.     07/26/2020 Cancer Staging   Staging form: Breast, AJCC 8th Edition - Pathologic stage from 07/26/2020: Stage IA (pT1c, pN1a, cM0, G2, ER+, PR+, HER2-) - Signed by Malachy Mood, MD on 08/09/2020 Stage prefix: Initial diagnosis Nuclear grade: G2 Histologic grading system: 3 grade system Residual tumor (R): R0 - None   07/26/2020 Surgery   RIGHT BREAST LUMPECTOMY WITH RADIOACTIVE SEED AND SENTINEL LYMPH NODE BIOPSY by Dr Carolynne Edouard   07/26/2020  Pathology Results   FINAL MICROSCOPIC DIAGNOSIS:   A. LYMPH NODE, RIGHT AXILLARY #1, SENTINEL, EXCISION:  - Lymph node, negative for carcinoma (0/1)   B. LYMPH NODE, RIGHT AXILLARY, SENTINEL, EXCISION:  - Benign fibroadipose tissue, negative for carcinoma   C. BREAST, RIGHT, LUMPECTOMY:  - Invasive ductal carcinoma, 1.5 cm, grade 2  - Ductal carcinoma in situ, low grade  - Resection margins are negative for carcinoma; closest is the anterior margin of 0.2 cm  - Biopsy site changes  - See oncology table   D. BREAST, LEFT, LUMPECTOMY:  - Benign breast parenchyma with prominent biopsy-related changes  - Negative for residual ductal carcinoma in situ  - See oncology table   E. LYMPH NODE, RIGHT AXILLARY #2, SENTINEL, EXCISION:  - Invasive ductal carcinoma, see comment  COMMENT:  E.  Lymph node tissue is not identified.  Findings likely represent an entirely replaced lymph node with foci of extranodal extension.    07/26/2020 Miscellaneous   Mammaprint High Risk of Luminla Type B 29% risk of recurrence in 10 years if untreated.  Her Mammaprint index is -0.175 She has 94.6% benefit of chemotherapy and hormaonal therapy.      08/20/2020 Imaging   CT C/A/P IMPRESSION: 1. No definitive imaging findings to suggest metastatic disease in the chest, abdomen or pelvis. 2. Postoperative changes of bilateral lumpectomy and right axillary lymph node dissection with what appears to be a large postoperative seroma in the right axilla, as detailed above. Attention on follow-up studies is recommended to ensure the stability or regression of this collection. 3. Additional incidental findings, as above.   08/28/2020 Imaging   Bone  Scan IMPRESSION: No definite scintigraphic evidence of osseous metastases.   08/30/2020 - 11/02/2020 Chemotherapy   Adjuvant Docetaxel and Cytoxan q3weeks for 4 cycles starting 08/30/20. Given skin rash, changes taxol to Abraxane starting with C2 (09/21/20).  Completed 11/02/20.    12/03/2020 - 01/15/2021 Radiation Therapy   Bilateral breast radiation and right regional lymph nodes   02/2021 -  Anti-estrogen oral therapy   Adjuvant exemestane   04/11/2021 Survivorship   SCP delivered by Santiago Glad, NP   Ductal carcinoma in situ (DCIS) of left breast  06/08/2020 Mammogram   IMPRESSION: 1. 9 x 7 x 6 mm mass in the 12 o'clock position of the right breast, 2cmfn with imaging features highly suspicious for malignancy. 2. 4 mm group of indeterminate calcifications in the 12 o'clock position of the left breast and 4 mm group of indeterminate calcifications in the 1 o'clock position of the left breast. Together, the groups span an area measuring 3.9 cm.   06/21/2020 Cancer Staging   Staging form: Breast, AJCC 8th Edition - Clinical stage from 06/21/2020: Stage 0 (cTis (DCIS), cN0, cM0, G2, ER+, PR+, HER2: Not Assessed) - Signed by Malachy Mood, MD on 06/26/2020 Stage prefix: Initial diagnosis   06/21/2020 Initial Biopsy   Diagnosis 1. Breast, right, needle core biopsy, 12 oc - INVASIVE MAMMARY CARCINOMA - MAMMARY CARCINOMA IN SITU - SEE COMMENT 2. Breast, left, needle core biopsy, 12 oc - MAMMARY CARCINOMA IN-SITU WITH NECROSIS AND CALCIFICATIONS - SEE COMMENT 3. Breast, left, needle core biopsy, 1 oc - MAMMARY CARCINOMA IN-SITU WITH NECROSIS AND CALCIFICATIONS - SEE COMMENT Microscopic Comment 1. The biopsy material shows an infiltrative proliferation of cells with large vesicular nuclei with inconspicuous nucleoli, arranged linearly and in small clusters. Based on the biopsy, the carcinoma appears Nottingham grade 2 of 3.  Addendum: 1. E-cadherin is POSITIVE supporting a ductal origin. 2. E-cadherin is POSITIVE supporting a ductal origin.  3. E-cadherin is positive supporting a ductal origin. The focus is less pronounced on the deeper sections and in isolation would likely be considered atypical ductal hyperplasia.   06/21/2020 Receptors her2    2. PROGNOSTIC INDICATORS Results: IMMUNOHISTOCHEMICAL AND MORPHOMETRIC ANALYSIS PERFORMED MANUALLY Estrogen Receptor: 95%, POSITIVE, STRONG STAINING INTENSITY Progesterone Receptor: 30%, POSITIVE, STRONG STAINING INTENSITY   06/25/2020 Initial Diagnosis   Ductal carcinoma in situ (DCIS) of left breast   07/03/2020 Breast MRI   IMPRESSION: 1. Known RIGHT breast cancer, 12 o'clock axis, at anterior depth, measuring 1 cm greatest extent, manifesting as a spiculated enhancing mass on MRI, with associated biopsy clip. Expected post biopsy changes are seen within the adjacent outer RIGHT breast. 2. No evidence of additional multifocal or multicentric disease within the RIGHT breast. 3. Known LEFT breast DCIS within the slightly outer LEFT breast, at anterior depth, corresponding to the biopsy site labeled 1 o'clock axis, with associated enhancement only at the margins of the biopsy cavity measuring up to 5 mm greatest dimension. 4. Known LEFT breast DCIS within the upper central LEFT breast, at middle depth, corresponding to the biopsy site labeled 12 o'clock axis. Contiguous linear non-mass enhancement extends 2.3 cm superior-medial to the biopsy cavity, most likely post biopsy change but possibly contiguous extent of disease. 5. No evidence of additional multifocal or multicentric disease within the LEFT breast. 6. No evidence of metastatic lymphadenopathy.     07/26/2020 Surgery   LEFT BREAST LUMPECTOMY X 2  WITH RADIOACTIVE SEED LOCALIZATION by Dr Carolynne Edouard   07/26/2020 Pathology Results   FINAL MICROSCOPIC  DIAGNOSIS:   A. LYMPH NODE, RIGHT AXILLARY #1, SENTINEL, EXCISION:  - Lymph node, negative for carcinoma (0/1)   B. LYMPH NODE, RIGHT AXILLARY, SENTINEL, EXCISION:  - Benign fibroadipose tissue, negative for carcinoma   C. BREAST, RIGHT, LUMPECTOMY:  - Invasive ductal carcinoma, 1.5 cm, grade 2  - Ductal carcinoma in situ, low grade  - Resection margins are negative for carcinoma;  closest is the anterior margin of 0.2 cm  - Biopsy site changes  - See oncology table   D. BREAST, LEFT, LUMPECTOMY:  - Benign breast parenchyma with prominent biopsy-related changes  - Negative for residual ductal carcinoma in situ  - See oncology table   E. LYMPH NODE, RIGHT AXILLARY #2, SENTINEL, EXCISION:  - Invasive ductal carcinoma, see comment  COMMENT:  E.  Lymph node tissue is not identified.  Findings likely represent an entirely replaced lymph node with foci of extranodal extension.    12/03/2020 - 01/15/2021 Radiation Therapy   Bilateral breast radiation and right regional lymph nodes   02/2021 -  Anti-estrogen oral therapy   Adjuvant exemestane   04/11/2021 Survivorship   SCP delivered by Santiago Glad, NP      CURRENT THERAPY: Exemestane started 02/07/2021, switch to low-dose letrozole 10/22/2021, did not tolerate full dose and stopped anti-estrogen in 01/2022  INTERVAL HISTORY Ms. Mcdill returns for follow up as scheduled. Last seen by me 03/25/22. Mammogram 06/11/22 was benign. She has been working with a Paramedic and sees PCP in the interim.   ROS   Past Medical History:  Diagnosis Date   Anal fissure    Atrial septal aneurysm / if pfo  echo 6 13  10/23/2011   Breast cancer (HCC)    Cataract    both eyes   Depression    Fatty liver    "pre fatty liver"   Fibromyalgia    Headache(784.0)    hx of migraines when younger   Heart palpitations    hx with normal holter event monitoring   Hx: UTI (urinary tract infection)    Hyperlipidemia    Hypertension    Hypothyroidism    Obesity    Personal history of chemotherapy    Personal history of radiation therapy    Pneumonia 1972   hx of   Polyp of colon    Serrated adenoma of colon 08/2012   Skin cancer    basal, squamous cell   Sleep apnea    uses cpap     Past Surgical History:  Procedure Laterality Date   BREAST LUMPECTOMY     BREAST LUMPECTOMY WITH RADIOACTIVE SEED AND SENTINEL LYMPH NODE BIOPSY  Right 07/26/2020   Procedure: RIGHT BREAST LUMPECTOMY WITH RADIOACTIVE SEED AND SENTINEL LYMPH NODE BIOPSY;  Surgeon: Griselda Miner, MD;  Location: [facility] OR;  Service: General;  Laterality: Right;   BREAST LUMPECTOMY WITH RADIOACTIVE SEED LOCALIZATION Left 07/26/2020   Procedure: LEFT BREAST LUMPECTOMY X 2  WITH RADIOACTIVE SEED LOCALIZATION;  Surgeon: Griselda Miner, MD;  Location: [facility] OR;  Service: General;  Laterality: Left;   COLONOSCOPY     DILATION AND CURETTAGE OF UTERUS  1978   ECTOPIC PREGNANCY SURGERY  1983   KNEE ARTHROSCOPY     both in past   POLYPECTOMY     SHOULDER OPEN ROTATOR CUFF REPAIR Left 07/07/2013   Procedure: LEFT SHOULDER MINI OPEN SUBACROMIAL DECOMPRESSION ROTATOR CUFF REPAIR AND POSSIBLE PATCH GRAFT ;  Surgeon: Javier Docker, MD;  Location: [facility];  Service: Orthopedics;  Laterality: Left;  with interscaline block   SKIN CANCER EXCISION Bilateral    arm, legs, and chest   TONSILLECTOMY       Outpatient Encounter Medications as of 09/22/2022  Medication Sig Note   amLODipine (NORVASC) 10 MG tablet Take 10 mg by mouth daily. 06/20/2022: Taking 1/2 tab qam and 1/2 tab qpm   ARMOUR THYROID PO     Ascorbic Acid (VITAMIN C) 1000 MG tablet Take 1,000 mg by mouth daily.    b complex vitamins capsule Take 1 capsule by mouth daily.    Bioflavonoid Products (QUERCETIN COMPLEX IMMUNE PO) Take 1 capsule by mouth as needed.    Cholecalciferol (VITAMIN D) 50 MCG (2000 UT) tablet Take 4,000 Units by mouth daily.    hydrochlorothiazide (HYDRODIURIL) 25 MG tablet Take 12.5 mg by mouth daily. 06/20/2022: Takes prn for ankle swelling   hydrocortisone (ANUSOL-HC) 2.5 % rectal cream Place 1 Application rectally 2 (two) times daily.    MAGNESIUM CARBONATE PO Take 1 tablet by mouth daily.    melatonin 5 MG TABS Take 5 mg by mouth at bedtime.    Menaquinone-7 (VITAMIN K2) 100 MCG CAPS Take 100 mcg by mouth daily.    Multiple Vitamin (MULTIVITAMIN) tablet Take 1 tablet by mouth daily.     OVER THE COUNTER MEDICATION Take 2 capsules by mouth in the morning and at bedtime. Infla Med    OVER THE COUNTER MEDICATION Take 1 capsule by mouth daily. Essential Pro    potassium chloride SA (KLOR-CON M) 20 MEQ tablet TAKE ONE TABLET BY MOUTH ONE TIME DAILY    Probiotic Product (PROBIOTIC PO) Take 1 capsule by mouth daily.    UBIQUINOL PO Take by mouth.    ZINC GLUCONATE PO Take 10 mg by mouth daily.    No facility-administered encounter medications on file as of 09/22/2022.     There were no vitals filed for this visit. There is no height or weight on file to calculate BMI.   PHYSICAL EXAM GENERAL:alert, no distress and comfortable SKIN: no rash  EYES: sclera clear NECK: without mass LYMPH:  no palpable cervical or supraclavicular lymphadenopathy  LUNGS: clear with normal breathing effort HEART: regular rate & rhythm, no lower extremity edema ABDOMEN: abdomen soft, non-tender and normal bowel sounds NEURO: alert & oriented x 3 with fluent speech, no focal motor/sensory deficits Breast exam:  PAC without erythema    CBC    Component Value Date/Time   WBC 6.5 03/25/2022 0938   WBC 39.4 (H) 09/08/2020 1759   RBC 4.34 03/25/2022 0938   HGB 13.4 03/25/2022 0938   HCT 39.7 03/25/2022 0938   PLT 214 03/25/2022 0938   MCV 91.5 03/25/2022 0938   MCH 30.9 03/25/2022 0938   MCHC 33.8 03/25/2022 0938   RDW 12.9 03/25/2022 0938   LYMPHSABS 1.5 03/25/2022 0938   MONOABS 0.6 03/25/2022 0938   EOSABS 0.1 03/25/2022 0938   BASOSABS 0.0 03/25/2022 0938     CMP     Component Value Date/Time   NA 140 03/25/2022 0938   NA 141 01/09/2020 0000   K 4.4 03/25/2022 0938   CL 104 03/25/2022 0938   CO2 31 03/25/2022 0938   GLUCOSE 85 03/25/2022 0938   BUN 21 03/25/2022 0938   BUN 13 06/05/2020 0000   CREATININE 0.68 03/25/2022 0938   CREATININE 0.86 02/06/2011 0937   CALCIUM 9.6 03/25/2022 0938   PROT 7.3 03/25/2022 0938   ALBUMIN 4.4 03/25/2022 0938   AST 25  03/25/2022 0938    ALT 52 (H) 03/25/2022 0938   ALKPHOS 128 (H) 03/25/2022 0938   BILITOT 0.7 03/25/2022 0938   GFRNONAA >60 03/25/2022 0938   GFRNONAA >60 02/06/2011 0937   GFRAA 95 01/09/2020 0000   GFRAA >60 02/06/2011 0937     ASSESSMENT & PLAN:[name] is a 67 y.o. female with         2. Malignant neoplasm of upper-outer quadrant of right breast, Stage IB, p(T1cN1aM0), ER+/PR+/HER2-, Grade II AND Left breast DCIS, grade II, ER+/PR+ (removed by biopsy) -diagnosed in 06/2020, s/p b/l lumpectomy and right SNLB by Dr Carolynne Edouard on 07/26/20.  -Mammaprint showed high risk Luminal Type B with 29% risk of distant recurrence.  -She completed adjuvant chemo with TC.  Due to allergy reaction to docetaxel, it was changed to Abraxane from cycle 3. -She received adjuvant radiation under Dr. Basilio Cairo 7/25-01/15/21. -she started exemestane in late 01/2021. She is receiving financial assistance through [employer]" (see MyChart note from 06/11/21). -exemestane stopped 08/2021 for SE's, see #2.  She switched to low-dose letrozole in 10/22/2021 and increased to full dose in 12/2021 but did not tolerate it after 2-3 weeks and stopped.  Main side effects were severe cramping and toes curling making it difficult to ambulate and poor quality of life.  SEs have improved since stopping AI. I recommended low dose tamoxifen but she declined *** -Mammo 06/11/22 was benign    2.Symptom management: Generalized and right shoulder joint pain, breast/muscle pain, fatigue, brain fog, depressed mood, elevated BP  -tolerated exemestane from 03/2021 until 08/2021 with intolerable side effects including generalized bone/joint pain, breast/pectoralis pain, fatigue, brain fog, mood disturbance and elevated BP.   -stopped exemestane 08/2021 and SE's have essentially resolved. She much better overall -She switched to low-dose letrozole in 10/2021 and increased to full dose in 12/2021.  She stopped after 2-3 weeks due to severe cramps and toes curling with  difficulty ambulating and poor quality of life  -We discussed trying tamoxifen as a last effort for antiestrogen therapy, she will consider it and let me know -Endo at [facility]    3.  Tremor -Onset Aug/Sept - 2023; mild non-resting tremor in her hands -It does improve when she eats after intermittent fasting -We reviewed other potential etiologies including anxiety, med side effect, or multiple others -She plans to discuss with neurology at next visit 11/30   4. Bone Health -she reports her last DEXA was in 03/2019, showing osteopenia.  -repeat per Dr. Arelia Sneddon   5. Comorbidities: Depression, Fibromyalgia, HLD, HTN, Hypothyroidism, H/o Skin cancer (squamous and basal cell), sleep apnea -She is followed for HTN. She takes her BP at home.  -On medications, continue to f/u with her other physicians.    6.  Fatty liver and transaminitis -She has chronic intermittent transaminitis, attributes to fatty liver.  She is on low sugar diet, drinks a lot of water, does not drink alcohol, and avoids Tylenol -Liver morphology was unremarkable on staging CT CAP 4/22 -Her LFTs fluctuate -Continue monitoring      PLAN:  No orders of the defined types were placed in this encounter.     All questions were answered. The patient knows to call the clinic with any problems, questions or concerns. No barriers to learning were detected. I spent *** counseling the patient face to face. The total time spent in the appointment was *** and more than 50% was on counseling, review of test results, and coordination of care.  Cira Rue, NP-C '@DATE'$ @

## 2022-09-22 ENCOUNTER — Inpatient Hospital Stay: Payer: Medicare HMO | Attending: Nurse Practitioner

## 2022-09-22 ENCOUNTER — Other Ambulatory Visit: Payer: Self-pay

## 2022-09-22 ENCOUNTER — Inpatient Hospital Stay: Payer: Medicare HMO | Admitting: Nurse Practitioner

## 2022-09-22 ENCOUNTER — Encounter: Payer: Self-pay | Admitting: Nurse Practitioner

## 2022-09-22 VITALS — BP 139/77 | HR 77 | Temp 98.8°F | Resp 17 | Wt 211.9 lb

## 2022-09-22 DIAGNOSIS — M797 Fibromyalgia: Secondary | ICD-10-CM | POA: Diagnosis not present

## 2022-09-22 DIAGNOSIS — Z170 Estrogen receptor positive status [ER+]: Secondary | ICD-10-CM | POA: Insufficient documentation

## 2022-09-22 DIAGNOSIS — K760 Fatty (change of) liver, not elsewhere classified: Secondary | ICD-10-CM | POA: Insufficient documentation

## 2022-09-22 DIAGNOSIS — M8580 Other specified disorders of bone density and structure, unspecified site: Secondary | ICD-10-CM | POA: Insufficient documentation

## 2022-09-22 DIAGNOSIS — E785 Hyperlipidemia, unspecified: Secondary | ICD-10-CM | POA: Diagnosis not present

## 2022-09-22 DIAGNOSIS — M25511 Pain in right shoulder: Secondary | ICD-10-CM | POA: Diagnosis not present

## 2022-09-22 DIAGNOSIS — G4730 Sleep apnea, unspecified: Secondary | ICD-10-CM | POA: Diagnosis not present

## 2022-09-22 DIAGNOSIS — Z85828 Personal history of other malignant neoplasm of skin: Secondary | ICD-10-CM | POA: Diagnosis not present

## 2022-09-22 DIAGNOSIS — C50411 Malignant neoplasm of upper-outer quadrant of right female breast: Secondary | ICD-10-CM

## 2022-09-22 DIAGNOSIS — I10 Essential (primary) hypertension: Secondary | ICD-10-CM | POA: Diagnosis not present

## 2022-09-22 DIAGNOSIS — R5383 Other fatigue: Secondary | ICD-10-CM | POA: Insufficient documentation

## 2022-09-22 DIAGNOSIS — D0512 Intraductal carcinoma in situ of left breast: Secondary | ICD-10-CM | POA: Diagnosis not present

## 2022-09-22 DIAGNOSIS — Z923 Personal history of irradiation: Secondary | ICD-10-CM | POA: Diagnosis not present

## 2022-09-22 DIAGNOSIS — Z79899 Other long term (current) drug therapy: Secondary | ICD-10-CM | POA: Insufficient documentation

## 2022-09-22 DIAGNOSIS — Z9221 Personal history of antineoplastic chemotherapy: Secondary | ICD-10-CM | POA: Diagnosis not present

## 2022-09-22 DIAGNOSIS — Z79811 Long term (current) use of aromatase inhibitors: Secondary | ICD-10-CM | POA: Diagnosis not present

## 2022-09-22 DIAGNOSIS — E039 Hypothyroidism, unspecified: Secondary | ICD-10-CM | POA: Insufficient documentation

## 2022-09-22 LAB — CBC WITH DIFFERENTIAL (CANCER CENTER ONLY)
Abs Immature Granulocytes: 0.01 10*3/uL (ref 0.00–0.07)
Basophils Absolute: 0 10*3/uL (ref 0.0–0.1)
Basophils Relative: 1 %
Eosinophils Absolute: 0.1 10*3/uL (ref 0.0–0.5)
Eosinophils Relative: 2 %
HCT: 41.8 % (ref 36.0–46.0)
Hemoglobin: 13.9 g/dL (ref 12.0–15.0)
Immature Granulocytes: 0 %
Lymphocytes Relative: 25 %
Lymphs Abs: 1.6 10*3/uL (ref 0.7–4.0)
MCH: 29.5 pg (ref 26.0–34.0)
MCHC: 33.3 g/dL (ref 30.0–36.0)
MCV: 88.7 fL (ref 80.0–100.0)
Monocytes Absolute: 0.4 10*3/uL (ref 0.1–1.0)
Monocytes Relative: 7 %
Neutro Abs: 4.1 10*3/uL (ref 1.7–7.7)
Neutrophils Relative %: 65 %
Platelet Count: 207 10*3/uL (ref 150–400)
RBC: 4.71 MIL/uL (ref 3.87–5.11)
RDW: 13.8 % (ref 11.5–15.5)
WBC Count: 6.4 10*3/uL (ref 4.0–10.5)
nRBC: 0 % (ref 0.0–0.2)

## 2022-09-22 LAB — CMP (CANCER CENTER ONLY)
ALT: 29 U/L (ref 0–44)
AST: 21 U/L (ref 15–41)
Albumin: 4.5 g/dL (ref 3.5–5.0)
Alkaline Phosphatase: 108 U/L (ref 38–126)
Anion gap: 8 (ref 5–15)
BUN: 17 mg/dL (ref 8–23)
CO2: 29 mmol/L (ref 22–32)
Calcium: 9.4 mg/dL (ref 8.9–10.3)
Chloride: 106 mmol/L (ref 98–111)
Creatinine: 0.71 mg/dL (ref 0.44–1.00)
GFR, Estimated: >60 mL/min (ref >60)
Glucose, Bld: 92 mg/dL (ref 70–99)
Potassium: 3.9 mmol/L (ref 3.5–5.1)
Sodium: 143 mmol/L (ref 135–145)
Total Bilirubin: 0.8 mg/dL (ref 0.3–1.2)
Total Protein: 7.3 g/dL (ref 6.5–8.1)

## 2022-09-23 DIAGNOSIS — R002 Palpitations: Secondary | ICD-10-CM | POA: Diagnosis not present

## 2022-09-23 DIAGNOSIS — E782 Mixed hyperlipidemia: Secondary | ICD-10-CM | POA: Diagnosis not present

## 2022-09-23 DIAGNOSIS — I10 Essential (primary) hypertension: Secondary | ICD-10-CM | POA: Diagnosis not present

## 2022-09-24 DIAGNOSIS — F6089 Other specific personality disorders: Secondary | ICD-10-CM | POA: Diagnosis not present

## 2022-10-08 DIAGNOSIS — F6089 Other specific personality disorders: Secondary | ICD-10-CM | POA: Diagnosis not present

## 2022-10-15 ENCOUNTER — Telehealth: Payer: Self-pay

## 2022-10-15 NOTE — Progress Notes (Signed)
Patient ID: [name], female   DOB: [birth year], 67 y.o.   MRN: [id number] Care Management & Coordination Services Pharmacy Team  Reason for Encounter: Hypertension  Contacted patient to discuss hypertension disease state. Unsuccessful outreach. Left voicemail for patient to return call.    Adherence Review: Is the patient currently on ACE/ARB medication? No Does the patient have >5 day gap between last estimated fill dates? No  Star Rating Drugs:  None   Chart Updates: Recent office visits:  09/12/22 [place], Betty G, MD - Patient presented for Tick bite of head and other concerns. Increased Doxycycline.   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 Stopped Magnesium Carbonate.  09/12/22 Frances Frizzle, MD  (Otolaryngology) - Patient presented for Tinnitus of both ears. No medication changes.  09/12/22 [name] (Audiology) - No visit details available.  09/09/22 Frances Smothers, PhD  [facility]) - Patient presented for Mixed personality disorder. No medication changes.  08/26/22   Frances Smothers, PhD  [facility]) - Patient presented for Mixed personality disorder. No medication changes.  08/21/22 [name], Casimiro Needle - Claims encounter for Segmental and somatic dysfunction of cervical region and other concerns. No other visit details available.    Hospital visits:  None in previous 6 months  Medications: Outpatient Encounter Medications as of 10/15/2022  Medication Sig Note   amLODipine (NORVASC) 10 MG tablet Take 10 mg by mouth daily. 06/20/2022: Taking 1/2 tab qam and 1/2 tab qpm   ARMOUR THYROID PO     Ascorbic Acid (VITAMIN C) 1000 MG tablet Take 1,000 mg by mouth daily.    b complex vitamins capsule Take 1 capsule by mouth daily.    Bioflavonoid Products (QUERCETIN COMPLEX IMMUNE PO)  Take 1 capsule by mouth as needed.    Cholecalciferol (VITAMIN D) 50 MCG (2000 UT) tablet Take 4,000 Units by mouth daily.    hydrocortisone (ANUSOL-HC) 2.5 % rectal cream Place 1 Application rectally 2 (two) times daily.    melatonin 5 MG TABS Take 5 mg by mouth at bedtime.    Menaquinone-7 (VITAMIN K2) 100 MCG CAPS Take 100 mcg by mouth daily.    Multiple Vitamin (MULTIVITAMIN) tablet Take 1 tablet by mouth daily.    OVER THE COUNTER MEDICATION Take 2 capsules by mouth in the morning and at bedtime. Infla Med    OVER THE COUNTER MEDICATION Take 1 capsule by mouth daily. Essential Pro    potassium chloride SA (KLOR-CON M) 20 MEQ tablet TAKE ONE TABLET BY MOUTH ONE TIME DAILY    Probiotic Product (PROBIOTIC PO) Take 1 capsule by mouth daily.    UBIQUINOL PO Take by mouth.    ZINC GLUCONATE PO Take 10 mg by mouth daily.    No facility-administered encounter medications on file as of 10/15/2022.    Recent Office Vitals: BP Readings from Last 3 Encounters:  09/22/22 139/77  09/12/22 136/74  03/25/22 (!) 153/76   Pulse Readings from Last 3 Encounters:  09/22/22 77  09/12/22 95  03/25/22 81    Wt Readings from Last 3 Encounters:  09/22/22 211 lb 14.4 oz (96.1 kg)  09/12/22 210 lb 6 oz (95.4 kg)  06/02/22 208 lb 4 oz (94.5 kg)     Kidney Function Lab Results  Component Value Date/Time   CREATININE 0.71 09/22/2022 08:41 AM   CREATININE 0.68 03/25/2022 09:38 AM  CREATININE 0.86 02/06/2011 09:37 AM   GFR 86.85 12/30/2018 03:50 PM   GFRNONAA >60 09/22/2022 08:41 AM   GFRNONAA >60 02/06/2011 09:37 AM   GFRAA 95 01/09/2020 12:00 AM   GFRAA >60 02/06/2011 09:37 AM       Latest Ref Rng & Units 09/22/2022    8:41 AM 03/25/2022    9:38 AM 12/23/2021   10:32 AM  BMP  Glucose 70 - 99 mg/dL 92  85  536   BUN 8 - 23 mg/dL 17  21  20    Creatinine 0.44 - 1.00 mg/dL 6.44  0.34  7.42   Sodium 135 - 145 mmol/L 143  140  138   Potassium 3.5 - 5.1 mmol/L 3.9  4.4  3.9   Chloride 98 - 111  mmol/L 106  104  103   CO2 22 - 32 mmol/L 29  31  30    Calcium 8.9 - 10.3 mg/dL 9.4  9.6  9.8        [name] CMA Clinical Pharmacist Assistant [phone]

## 2022-10-16 DIAGNOSIS — M531 Cervicobrachial syndrome: Secondary | ICD-10-CM | POA: Diagnosis not present

## 2022-10-16 DIAGNOSIS — M9903 Segmental and somatic dysfunction of lumbar region: Secondary | ICD-10-CM | POA: Diagnosis not present

## 2022-10-16 DIAGNOSIS — M9902 Segmental and somatic dysfunction of thoracic region: Secondary | ICD-10-CM | POA: Diagnosis not present

## 2022-10-16 DIAGNOSIS — M9901 Segmental and somatic dysfunction of cervical region: Secondary | ICD-10-CM | POA: Diagnosis not present

## 2022-10-20 DIAGNOSIS — M5412 Radiculopathy, cervical region: Secondary | ICD-10-CM | POA: Diagnosis not present

## 2022-10-20 DIAGNOSIS — F419 Anxiety disorder, unspecified: Secondary | ICD-10-CM | POA: Diagnosis not present

## 2022-10-20 DIAGNOSIS — G5603 Carpal tunnel syndrome, bilateral upper limbs: Secondary | ICD-10-CM | POA: Diagnosis not present

## 2022-10-20 DIAGNOSIS — M7918 Myalgia, other site: Secondary | ICD-10-CM | POA: Diagnosis not present

## 2022-10-22 DIAGNOSIS — F6089 Other specific personality disorders: Secondary | ICD-10-CM | POA: Diagnosis not present

## 2022-10-25 ENCOUNTER — Other Ambulatory Visit: Payer: Self-pay | Admitting: Family

## 2022-10-29 ENCOUNTER — Ambulatory Visit (INDEPENDENT_AMBULATORY_CARE_PROVIDER_SITE_OTHER): Payer: Medicare HMO

## 2022-10-29 VITALS — Ht 65.5 in | Wt 211.0 lb

## 2022-10-29 DIAGNOSIS — Z0000 Encounter for general adult medical examination without abnormal findings: Secondary | ICD-10-CM | POA: Diagnosis not present

## 2022-10-29 NOTE — Progress Notes (Signed)
Subjective:   [name] is a 67 y.o. female who presents for Medicare Annual (Subsequent) preventive examination.  Visit Complete: Virtual  I connected with  [name] on 10/29/22 by a audio enabled telemedicine application and verified that I am speaking with the correct person using two identifiers.  Patient Location: Home  Provider Location: Home Office  I discussed the limitations of evaluation and management by telemedicine. The patient expressed understanding and agreed to proceed.  Patient Medicare AWV questionnaire was completed by the patient on  ; I have confirmed that all information answered by patient is correct and no changes since this date.  Review of Systems     Cardiac Risk Factors include: advanced age (>84men, >35 women);hypertension     Objective:    Today's Vitals   10/29/22 1521  Weight: 211 lb (95.7 kg)  Height: 5' 5.5" (1.664 m)   Body mass index is 34.58 kg/m.     10/29/2022    3:28 PM 03/24/2022    9:37 AM 05/28/2021   11:33 AM 05/08/2021    4:12 PM 03/06/2021    4:01 PM 12/13/2020    1:08 PM 11/14/2020    9:55 AM  Advanced Directives  Does Patient Have a Medical Advance Directive? No No No No No No No  Would patient like information on creating a medical advance directive? No - Patient declined No - Patient declined No - Patient declined No - Patient declined No - Patient declined No - Patient declined No - Patient declined    Current Medications (verified) Outpatient Encounter Medications as of 10/29/2022  Medication Sig   amLODipine (NORVASC) 10 MG tablet Take 10 mg by mouth daily.   ARMOUR THYROID PO    Ascorbic Acid (VITAMIN C) 1000 MG tablet Take 1,000 mg by mouth daily.   b complex vitamins capsule Take 1 capsule by mouth daily.   Bioflavonoid Products (QUERCETIN COMPLEX IMMUNE PO) Take 1 capsule by mouth as needed.   Cholecalciferol (VITAMIN D) 50 MCG (2000 UT) tablet Take 4,000 Units by mouth daily.    hydrocortisone (ANUSOL-HC) 2.5 % rectal cream Place 1 Application rectally 2 (two) times daily.   melatonin 5 MG TABS Take 5 mg by mouth at bedtime.   Menaquinone-7 (VITAMIN K2) 100 MCG CAPS Take 100 mcg by mouth daily.   Multiple Vitamin (MULTIVITAMIN) tablet Take 1 tablet by mouth daily.   OVER THE COUNTER MEDICATION Take 2 capsules by mouth in the morning and at bedtime. Infla Med   OVER THE COUNTER MEDICATION Take 1 capsule by mouth daily. Essential Pro   potassium chloride SA (KLOR-CON M) 20 MEQ tablet TAKE ONE TABLET BY MOUTH ONE TIME DAILY   Probiotic Product (PROBIOTIC PO) Take 1 capsule by mouth daily.   UBIQUINOL PO Take by mouth.   ZINC GLUCONATE PO Take 10 mg by mouth daily.   No facility-administered encounter medications on file as of 10/29/2022.    Allergies (verified) Penicillins, Taxotere [docetaxel], Cefdinir, Cetirizine, Femara [letrozole], Irbesartan, Molds & smuts, Pegfilgrastim, and Tylenol [acetaminophen]   History: Past Medical History:  Diagnosis Date   Anal fissure    Atrial septal aneurysm / if pfo  echo 6 13  10/23/2011   Breast cancer (HCC)    Cataract    both eyes   Depression    Fatty liver    "pre fatty liver"   Fibromyalgia    Headache(784.0)    hx of migraines when younger   Heart palpitations  hx with normal holter event monitoring   Hx: UTI (urinary tract infection)    Hyperlipidemia    Hypertension    Hypothyroidism    Obesity    Personal history of chemotherapy    Personal history of radiation therapy    Pneumonia 1972   hx of   Polyp of colon    Serrated adenoma of colon 08/2012   Skin cancer    basal, squamous cell   Sleep apnea    uses cpap   Past Surgical History:  Procedure Laterality Date   BREAST LUMPECTOMY     BREAST LUMPECTOMY WITH RADIOACTIVE SEED AND SENTINEL LYMPH NODE BIOPSY Right 07/26/2020   Procedure: RIGHT BREAST LUMPECTOMY WITH RADIOACTIVE SEED AND SENTINEL LYMPH NODE BIOPSY;  Surgeon: Griselda Miner, MD;   Location: [facility] OR;  Service: General;  Laterality: Right;   BREAST LUMPECTOMY WITH RADIOACTIVE SEED LOCALIZATION Left 07/26/2020   Procedure: LEFT BREAST LUMPECTOMY X 2  WITH RADIOACTIVE SEED LOCALIZATION;  Surgeon: Griselda Miner, MD;  Location: [facility] OR;  Service: General;  Laterality: Left;   COLONOSCOPY     DILATION AND CURETTAGE OF UTERUS  1978   ECTOPIC PREGNANCY SURGERY  1983   KNEE ARTHROSCOPY     both in past   POLYPECTOMY     SHOULDER OPEN ROTATOR CUFF REPAIR Left 07/07/2013   Procedure: LEFT SHOULDER MINI OPEN SUBACROMIAL DECOMPRESSION ROTATOR CUFF REPAIR AND POSSIBLE PATCH GRAFT ;  Surgeon: Javier Docker, MD;  Location: [facility];  Service: Orthopedics;  Laterality: Left;  with interscaline block   SKIN CANCER EXCISION Bilateral    arm, legs, and chest   TONSILLECTOMY     Family History  Problem Relation Age of Onset   Hypertension Mother        low borderline   Osteoporosis Mother    Hypertension Father    Liver disease Father        amyloid deceased   Hyperlipidemia Father    Melanoma Sister    Cancer Sister        melanoma    Juvenile Diabetes Daughter    ADD / ADHD Child    Hyperlipidemia Other        Maternal grandmother   Colon cancer Neg Hx    Stomach cancer Neg Hx    Colon polyps Neg Hx    Esophageal cancer Neg Hx    Rectal cancer Neg Hx    Social History   Socioeconomic History   Marital status: Married    Spouse name: Not on file   Number of children: 6   Years of education: Not on file   Highest education level: Not on file  Occupational History   Occupation: TEACHER ASST    Employer: [employer]  Tobacco Use   Smoking status: Never   Smokeless tobacco: Never  Vaping Use   Vaping Use: Never used  Substance and Sexual Activity   Alcohol use: Yes    Comment: rare   Drug use: No   Sexual activity: Not on file  Other Topics Concern   Not on file  Social History Narrative   Teachers Aide [employer]  Not workking since last  February . 15 left shoulder surgery    Married   Special educ   HH of 4   3 outside dogs, 8 goats, 40 chickens and quail. [place]   Job stresses   Ex husband passes away   Not working after injur  at school  Shoulder neck                Social Determinants of Health   Financial Resource Strain: Low Risk  (10/29/2022)   Overall Financial Resource Strain (CARDIA)    Difficulty of Paying Living Expenses: Not hard at all  Food Insecurity: No Food Insecurity (10/29/2022)   Hunger Vital Sign    Worried About Running Out of Food in the Last Year: Never true    Ran Out of Food in the Last Year: Never true  Transportation Needs: No Transportation Needs (10/29/2022)   PRAPARE - Administrator, Civil Service (Medical): No    Lack of Transportation (Non-Medical): No  Physical Activity: Insufficiently Active (10/29/2022)   Exercise Vital Sign    Days of Exercise per Week: 2 days    Minutes of Exercise per Session: 20 min  Stress: No Stress Concern Present (10/29/2022)   [employer] of Occupational Health - Occupational Stress Questionnaire    Feeling of Stress : Not at all  Social Connections: Socially Integrated (10/29/2022)   Social Connection and Isolation Panel [NHANES]    Frequency of Communication with Friends and Family: More than three times a week    Frequency of Social Gatherings with Friends and Family: More than three times a week    Attends Religious Services: More than 4 times per year    Active Member of [employer] or Organizations: Yes    Attends Engineer, structural: More than 4 times per year    Marital Status: Married    Tobacco Counseling Counseling given: Not Answered   Clinical Intake:  Pre-visit preparation completed: No  Pain : No/denies pain     BMI - recorded: 34.58 Nutritional Status: BMI > 30  Obese Nutritional Risks: None Diabetes: No  How often do you need to have someone help you when you read instructions,  pamphlets, or other written materials from your doctor or pharmacy?: 1 - Never  Interpreter Needed?: No  Information entered by :: Theresa Mulligan LPN   Activities of Daily Living    10/29/2022    3:26 PM  In your present state of health, do you have any difficulty performing the following activities:  Hearing? 0  Vision? 0  Difficulty concentrating or making decisions? 0  Walking or climbing stairs? 0  Dressing or bathing? 0  Doing errands, shopping? 0  Preparing Food and eating ? N  Using the Toilet? N  In the past six months, have you accidently leaked urine? N  Do you have problems with loss of bowel control? N  Managing your Medications? N  Managing your Finances? N  Housekeeping or managing your Housekeeping? N    Patient Care Team: Panosh, [name], MD as PCP - General Richardean Chimera, MD as Consulting Physician (Obstetrics and Gynecology) Dr Massie Kluver, MD as Consulting Physician (Orthopedic Surgery) Meryl Dare, MD as Consulting Physician (Gastroenterology) Venita Lick, MD as Consulting Physician (Orthopedic Surgery) Donnelly Angelica, RN as Oncology Nurse Navigator Pershing Proud, RN as Oncology Nurse Navigator Griselda Miner, MD as Consulting Physician (General Surgery) Malachy Mood, MD as Consulting Physician (Hematology) Lonie Peak, MD as Attending Physician (Radiation Oncology) Elmon Else, MD as Consulting Physician (Dermatology) Caren Griffins, MD as Referring Physician (Cardiology) [name], NP as Nurse Practitioner (Nurse Practitioner) Sherrill Raring, [facility] (Inactive) (Pharmacist)  Indicate any recent Medical Services you may have received from other than [facility] providers in  the past year (date may be approximate).     Assessment:   This is a routine wellness examination for Asharia.  Hearing/Vision screen Hearing Screening - Comments:: Denies hearing difficulties   Vision Screening - Comments:: Wears rx glasses - up to date with  routine eye exams with  Dr Marti Sleigh  Dietary issues and exercise activities discussed:     Goals Addressed               This Visit's Progress     Increase physical activity (pt-stated)         Depression Screen    10/29/2022    3:26 PM 09/12/2022   10:19 AM 02/03/2022    2:05 PM 05/28/2021   11:26 AM 06/11/2020    1:38 PM 07/30/2016   11:55 AM 09/24/2015    4:14 PM  PHQ 2/9 Scores  PHQ - 2 Score 0 0 0 0 0 0 0  PHQ- 9 Score 0 1 2  0      Fall Risk    10/29/2022    3:27 PM 09/12/2022   10:02 AM 02/03/2022    2:05 PM 05/28/2021   11:28 AM 06/11/2020    1:37 PM  Fall Risk   Falls in the past year? 0 0 0 0 0  Number falls in past yr: 0 0 0 0 0  Injury with Fall? 0 0 0 0 0  Risk for fall due to : No Fall Risks Other (Comment) No Fall Risks  No Fall Risks  Follow up Falls prevention discussed Falls evaluation completed Falls evaluation completed  Falls evaluation completed;Falls prevention discussed    MEDICARE RISK AT HOME:  Medicare Risk at Home - 10/29/22 1534     Any stairs in or around the home? No    If so, are there any without handrails? No    Home free of loose throw rugs in walkways, pet beds, electrical cords, etc? Yes    Adequate lighting in your home to reduce risk of falls? Yes    Life alert? No    Use of a cane, walker or w/c? No    Grab bars in the bathroom? No    Shower chair or bench in shower? No    Elevated toilet seat or a handicapped toilet? No             TIMED UP AND GO:  Was the test performed?  No    Cognitive Function:        10/29/2022    3:28 PM 05/28/2021   11:30 AM  6CIT Screen  What Year? 0 points 0 points  What month? 0 points 0 points  What time? 0 points 0 points  Count back from 20 0 points 0 points  Months in reverse 0 points 0 points  Repeat phrase 0 points 0 points  Total Score 0 points 0 points    Immunizations Immunization History  Administered Date(s) Administered   Hep A / Hep B 08/02/2014, 09/20/2014,  06/20/2015   Influenza Split 03/11/2011   Influenza Whole 02/18/2010   Influenza-Unspecified 03/11/2011   Td 01/10/1998   Tdap 03/28/2014    TDAP status: Up to date  Flu Vaccine status: Declined, Education has been provided regarding the importance of this vaccine but patient still declined. Advised may receive this vaccine at local pharmacy or Health Dept. Aware to provide a copy of the vaccination record if obtained from local pharmacy or Health Dept. Verbalized acceptance and understanding.  Pneumococcal  vaccine status: Declined,  Education has been provided regarding the importance of this vaccine but patient still declined. Advised may receive this vaccine at local pharmacy or Health Dept. Aware to provide a copy of the vaccination record if obtained from local pharmacy or Health Dept. Verbalized acceptance and understanding.   Covid-19 vaccine status: Declined, Education has been provided regarding the importance of this vaccine but patient still declined. Advised may receive this vaccine at local pharmacy or Health Dept.or vaccine clinic. Aware to provide a copy of the vaccination record if obtained from local pharmacy or Health Dept. Verbalized acceptance and understanding.  Qualifies for Shingles Vaccine? Yes   Zostavax completed No   Shingrix Completed?: No.    Education has been provided regarding the importance of this vaccine. Patient has been advised to call insurance company to determine out of pocket expense if they have not yet received this vaccine. Advised may also receive vaccine at local pharmacy or Health Dept. Verbalized acceptance and understanding.  Screening Tests Health Maintenance  Topic Date Due   COVID-19 Vaccine (1) 11/14/2022 (Originally 01/18/1961)   Zoster Vaccines- Shingrix (1 of 2) 01/29/2023 (Originally 01/17/1975)   Pneumonia Vaccine 62+ Years old (1 of 2 - PCV) 10/29/2023 (Originally 01/18/1962)   INFLUENZA VACCINE  12/11/2022   Medicare Annual Wellness  (AWV)  10/29/2023   DTaP/Tdap/Td (3 - Td or Tdap) 03/28/2024   MAMMOGRAM  06/11/2024   Colonoscopy  03/10/2029   DEXA SCAN  Completed   Hepatitis C Screening  Completed   HPV VACCINES  Aged Out    Health Maintenance  There are no preventive care reminders to display for this patient.   Colorectal cancer screening: Type of screening: Colonoscopy. Completed 03/10/22. Repeat every 7 years  Mammogram status: Ordered Scheduled for 06/11/22. Pt provided with contact info and advised to call to schedule appt.   Bone Density status: Completed 04/10/21. Results reflect: Bone density results: OSTEOPENIA. Repeat every   years.  Lung Cancer Screening: (Low Dose CT Chest recommended if Age 34-80 years, 20 pack-year currently smoking OR have quit w/in 15years.) does not qualify.     Additional Screening:  Hepatitis C Screening: does qualify; Completed 08/10/14  Vision Screening: Recommended annual ophthalmology exams for early detection of glaucoma and other disorders of the eye. Is the patient up to date with their annual eye exam?  Yes  Who is the provider or what is the name of the office in which the patient attends annual eye exams? Dr Marti Sleigh If pt is not established with a provider, would they like to be referred to a provider to establish care? No .   Dental Screening: Recommended annual dental exams for proper oral hygiene   Community Resource Referral / Chronic Care Management:  CRR required this visit?  No   CCM required this visit?  No     Plan:     I have personally reviewed and noted the following in the patient's chart:   Medical and social history Use of alcohol, tobacco or illicit drugs  Current medications and supplements including opioid prescriptions. Patient is not currently taking opioid prescriptions. Functional ability and status Nutritional status Physical activity Advanced directives List of other physicians Hospitalizations, surgeries, and ER visits  in previous 12 months Vitals Screenings to include cognitive, depression, and falls Referrals and appointments  In addition, I have reviewed and discussed with patient certain preventive protocols, quality metrics, and best practice recommendations. A written personalized care plan for preventive services as well  as general preventive health recommendations were provided to patient.     Tillie Rung, LPN   1/61/0960   After Visit Summary: (MyChart) Due to this being a telephonic visit, the after visit summary with patients personalized plan was offered to patient via MyChart   Nurse Notes:  None

## 2022-10-29 NOTE — Patient Instructions (Addendum)
[name] , Thank you for taking time to come for your Medicare Wellness Visit. I appreciate your ongoing commitment to your health goals. Please review the following plan we discussed and let me know if I can assist you in the future.   These are the goals we discussed:  Goals       Exercise 150 min/wk Moderate Activity      Increase physical activity (pt-stated)      Patient Stated      I want to make scrapbooks for my entire family.      Patient Stated      I want to be more mindful and keep stress down.        This is a list of the screening recommended for you and due dates:  Health Maintenance  Topic Date Due   COVID-19 Vaccine (1) 11/14/2022*   Zoster (Shingles) Vaccine (1 of 2) 01/29/2023*   Pneumonia Vaccine (1 of 2 - PCV) 10/29/2023*   Flu Shot  12/11/2022   Medicare Annual Wellness Visit  10/29/2023   DTaP/Tdap/Td vaccine (3 - Td or Tdap) 03/28/2024   Mammogram  06/11/2024   Colon Cancer Screening  03/10/2029   DEXA scan (bone density measurement)  Completed   Hepatitis C Screening  Completed   HPV Vaccine  Aged Out  *Topic was postponed. The date shown is not the original due date.    Advanced directives: Advance directive discussed with you today. Even though you declined this today, please call our office should you change your mind, and we can give you the proper paperwork for you to fill out.   Conditions/risks identified: None  Next appointment: Follow up in one year for your annual wellness visit    Preventive Care 65 Years and Older, Female Preventive care refers to lifestyle choices and visits with your health care provider that can promote health and wellness. What does preventive care include? A yearly physical exam. This is also called an annual well check. Dental exams once or twice a year. Routine eye exams. Ask your health care provider how often you should have your eyes checked. Personal lifestyle choices, including: Daily care of your  teeth and gums. Regular physical activity. Eating a healthy diet. Avoiding tobacco and drug use. Limiting alcohol use. Practicing safe sex. Taking low-dose aspirin every day. Taking vitamin and mineral supplements as recommended by your health care provider. What happens during an annual well check? The services and screenings done by your health care provider during your annual well check will depend on your age, overall health, lifestyle risk factors, and family history of disease. Counseling  Your health care provider may ask you questions about your: Alcohol use. Tobacco use. Drug use. Emotional well-being. Home and relationship well-being. Sexual activity. Eating habits. History of falls. Memory and ability to understand (cognition). Work and work Astronomer. Reproductive health. Screening  You may have the following tests or measurements: Height, weight, and BMI. Blood pressure. Lipid and cholesterol levels. These may be checked every 5 years, or more frequently if you are over 83 years old. Skin check. Lung cancer screening. You may have this screening every year starting at age 85 if you have a 30-pack-year history of smoking and currently smoke or have quit within the past 15 years. Fecal occult blood test (FOBT) of the stool. You may have this test every year starting at age 52. Flexible sigmoidoscopy or colonoscopy. You may have a sigmoidoscopy every 5 years or a colonoscopy every  10 years starting at age 67. Hepatitis C blood test. Hepatitis B blood test. Sexually transmitted disease (STD) testing. Diabetes screening. This is done by checking your blood sugar (glucose) after you have not eaten for a while (fasting). You may have this done every 1-3 years. Bone density scan. This is done to screen for osteoporosis. You may have this done starting at age 37. Mammogram. This may be done every 1-2 years. Talk to your health care provider about how often you should have  regular mammograms. Talk with your health care provider about your test results, treatment options, and if necessary, the need for more tests. Vaccines  Your health care provider may recommend certain vaccines, such as: Influenza vaccine. This is recommended every year. Tetanus, diphtheria, and acellular pertussis (Tdap, Td) vaccine. You may need a Td booster every 10 years. Zoster vaccine. You may need this after age 36. Pneumococcal 13-valent conjugate (PCV13) vaccine. One dose is recommended after age 1. Pneumococcal polysaccharide (PPSV23) vaccine. One dose is recommended after age 1. Talk to your health care provider about which screenings and vaccines you need and how often you need them. This information is not intended to replace advice given to you by your health care provider. Make sure you discuss any questions you have with your health care provider. Document Released: 05/25/2015 Document Revised: 01/16/2016 Document Reviewed: 02/27/2015 Elsevier Interactive Patient Education  2017 [employer].  Fall Prevention in the Home Falls can cause injuries. They can happen to people of all ages. There are many things you can do to make your home safe and to help prevent falls. What can I do on the outside of my home? Regularly fix the edges of walkways and driveways and fix any cracks. Remove anything that might make you trip as you walk through a door, such as a raised step or threshold. Trim any bushes or trees on the path to your home. Use bright outdoor lighting. Clear any walking paths of anything that might make someone trip, such as rocks or tools. Regularly check to see if handrails are loose or broken. Make sure that both sides of any steps have handrails. Any raised decks and porches should have guardrails on the edges. Have any leaves, snow, or ice cleared regularly. Use sand or salt on walking paths during winter. Clean up any spills in your garage right away. This  includes oil or grease spills. What can I do in the bathroom? Use night lights. Install grab bars by the toilet and in the tub and shower. Do not use towel bars as grab bars. Use non-skid mats or decals in the tub or shower. If you need to sit down in the shower, use a plastic, non-slip stool. Keep the floor dry. Clean up any water that spills on the floor as soon as it happens. Remove soap buildup in the tub or shower regularly. Attach bath mats securely with double-sided non-slip rug tape. Do not have throw rugs and other things on the floor that can make you trip. What can I do in the bedroom? Use night lights. Make sure that you have a light by your bed that is easy to reach. Do not use any sheets or blankets that are too big for your bed. They should not hang down onto the floor. Have a firm chair that has side arms. You can use this for support while you get dressed. Do not have throw rugs and other things on the floor that can make you  trip. What can I do in the kitchen? Clean up any spills right away. Avoid walking on wet floors. Keep items that you use a lot in easy-to-reach places. If you need to reach something above you, use a strong step stool that has a grab bar. Keep electrical cords out of the way. Do not use floor polish or wax that makes floors slippery. If you must use wax, use non-skid floor wax. Do not have throw rugs and other things on the floor that can make you trip. What can I do with my stairs? Do not leave any items on the stairs. Make sure that there are handrails on both sides of the stairs and use them. Fix handrails that are broken or loose. Make sure that handrails are as long as the stairways. Check any carpeting to make sure that it is firmly attached to the stairs. Fix any carpet that is loose or worn. Avoid having throw rugs at the top or bottom of the stairs. If you do have throw rugs, attach them to the floor with carpet tape. Make sure that you  have a light switch at the top of the stairs and the bottom of the stairs. If you do not have them, ask someone to add them for you. What else can I do to help prevent falls? Wear shoes that: Do not have high heels. Have rubber bottoms. Are comfortable and fit you well. Are closed at the toe. Do not wear sandals. If you use a stepladder: Make sure that it is fully opened. Do not climb a closed stepladder. Make sure that both sides of the stepladder are locked into place. Ask someone to hold it for you, if possible. Clearly mark and make sure that you can see: Any grab bars or handrails. First and last steps. Where the edge of each step is. Use tools that help you move around (mobility aids) if they are needed. These include: Canes. Walkers. Scooters. Crutches. Turn on the lights when you go into a dark area. Replace any light bulbs as soon as they burn out. Set up your furniture so you have a clear path. Avoid moving your furniture around. If any of your floors are uneven, fix them. If there are any pets around you, be aware of where they are. Review your medicines with your doctor. Some medicines can make you feel dizzy. This can increase your chance of falling. Ask your doctor what other things that you can do to help prevent falls. This information is not intended to replace advice given to you by your health care provider. Make sure you discuss any questions you have with your health care provider. Document Released: 02/22/2009 Document Revised: 10/04/2015 Document Reviewed: 06/02/2014 Elsevier Interactive Patient Education  2017 [employer].

## 2022-11-04 DIAGNOSIS — F6089 Other specific personality disorders: Secondary | ICD-10-CM | POA: Diagnosis not present

## 2022-11-06 NOTE — Telephone Encounter (Signed)
Pt checking on progress of this refill

## 2022-11-12 DIAGNOSIS — M531 Cervicobrachial syndrome: Secondary | ICD-10-CM | POA: Diagnosis not present

## 2022-11-12 DIAGNOSIS — M9902 Segmental and somatic dysfunction of thoracic region: Secondary | ICD-10-CM | POA: Diagnosis not present

## 2022-11-12 DIAGNOSIS — M9901 Segmental and somatic dysfunction of cervical region: Secondary | ICD-10-CM | POA: Diagnosis not present

## 2022-11-12 DIAGNOSIS — M9903 Segmental and somatic dysfunction of lumbar region: Secondary | ICD-10-CM | POA: Diagnosis not present

## 2022-11-14 ENCOUNTER — Telehealth: Payer: Self-pay | Admitting: Internal Medicine

## 2022-11-14 NOTE — Telephone Encounter (Signed)
Prescription Request  11/14/2022  LOV: 02/03/2022  What is the name of the medication or equipment? potassium chloride SA potassium chloride SA (KLOR-CON M) 20 MEQ tablet  Pt said she will be completely out on Monday. Pt states she thinks Pharmacy is still waiting on PA from MD. Pt informed MD is OOO on Fridays, and will be out on Monday, as well.   Have you contacted your pharmacy to request a refill? No   Which pharmacy would you like this sent to?   [id number] - [place], [place] - [address] Phone: [phone]  Fax: [phone]      Patient notified that their request is being sent to the clinical staff for review and that they should receive a response within 2 business days.   Please advise at Mobile [phone] (mobile)

## 2022-11-14 NOTE — Telephone Encounter (Addendum)
ERROR PLEASE DISREGARD

## 2022-11-17 DIAGNOSIS — G4733 Obstructive sleep apnea (adult) (pediatric): Secondary | ICD-10-CM | POA: Diagnosis not present

## 2022-11-18 ENCOUNTER — Other Ambulatory Visit: Payer: Self-pay | Admitting: Internal Medicine

## 2022-12-01 ENCOUNTER — Ambulatory Visit: Payer: Medicare HMO | Attending: Nurse Practitioner | Admitting: Rehabilitation

## 2022-12-01 ENCOUNTER — Encounter: Payer: Self-pay | Admitting: Rehabilitation

## 2022-12-01 DIAGNOSIS — C50411 Malignant neoplasm of upper-outer quadrant of right female breast: Secondary | ICD-10-CM | POA: Insufficient documentation

## 2022-12-01 DIAGNOSIS — Z483 Aftercare following surgery for neoplasm: Secondary | ICD-10-CM | POA: Insufficient documentation

## 2022-12-01 DIAGNOSIS — Z170 Estrogen receptor positive status [ER+]: Secondary | ICD-10-CM | POA: Insufficient documentation

## 2022-12-01 NOTE — Therapy (Signed)
OUTPATIENT PHYSICAL THERAPY SOZO SCREENING NOTE   Patient Name: [name] MRN: [id number] DOB:1955/08/23, 67 y.o., female Today's Date: 12/01/2022  PCP: Madelin Headings, MD REFERRING PROVIDER: [name], NP   PT End of Session - 12/01/22 1000     Visit Number 9   screen   PT Start Time 0955    PT Stop Time 1000    PT Time Calculation (min) 5 min    Activity Tolerance Patient tolerated treatment well    Behavior During Therapy [facility] for tasks assessed/performed             Past Medical History:  Diagnosis Date   Anal fissure    Atrial septal aneurysm / if pfo  echo 6 13  10/23/2011   Breast cancer (HCC)    Cataract    both eyes   Depression    Fatty liver    "pre fatty liver"   Fibromyalgia    Headache(784.0)    hx of migraines when younger   Heart palpitations    hx with normal holter event monitoring   Hx: UTI (urinary tract infection)    Hyperlipidemia    Hypertension    Hypothyroidism    Obesity    Personal history of chemotherapy    Personal history of radiation therapy    Pneumonia 1972   hx of   Polyp of colon    Serrated adenoma of colon 08/2012   Skin cancer    basal, squamous cell   Sleep apnea    uses cpap   Past Surgical History:  Procedure Laterality Date   BREAST LUMPECTOMY     BREAST LUMPECTOMY WITH RADIOACTIVE SEED AND SENTINEL LYMPH NODE BIOPSY Right 07/26/2020   Procedure: RIGHT BREAST LUMPECTOMY WITH RADIOACTIVE SEED AND SENTINEL LYMPH NODE BIOPSY;  Surgeon: Griselda Miner, MD;  Location: [facility] OR;  Service: General;  Laterality: Right;   BREAST LUMPECTOMY WITH RADIOACTIVE SEED LOCALIZATION Left 07/26/2020   Procedure: LEFT BREAST LUMPECTOMY X 2  WITH RADIOACTIVE SEED LOCALIZATION;  Surgeon: Griselda Miner, MD;  Location: [facility] OR;  Service: General;  Laterality: Left;   COLONOSCOPY     DILATION AND CURETTAGE OF UTERUS  1978   ECTOPIC PREGNANCY SURGERY  1983   KNEE ARTHROSCOPY     both in past   POLYPECTOMY     SHOULDER  OPEN ROTATOR CUFF REPAIR Left 07/07/2013   Procedure: LEFT SHOULDER MINI OPEN SUBACROMIAL DECOMPRESSION ROTATOR CUFF REPAIR AND POSSIBLE PATCH GRAFT ;  Surgeon: Javier Docker, MD;  Location: [facility] OR[facility];  Service: Orthopedics;  Laterality: Left;  with interscaline block   SKIN CANCER EXCISION Bilateral    arm, legs, and chest   TONSILLECTOMY     Patient Active Problem List   Diagnosis Date Noted   Malignant neoplasm of upper-outer quadrant of right breast in female, estrogen receptor positive (HCC) 06/25/2020   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   Hepatic steatosis 11/27/2014   Hx of adenomatous colonic polyps 11/27/2014   Visit for preventive health examination 03/28/2014   Hyperlipidemia 03/28/2014   Left rotator cuff tear 07/07/2013   Tear of left rotator cuff 07/07/2013   Rhinitis 06/01/2013   Chest wall deformity 04/12/2012   Atrial septal aneurysm / if pfo  echo 6 13  10/23/2011   OSA (obstructive sleep apnea) 11/27/2010   Dyslipidemia 11/01/2010   Flushing 08/29/2010   Labile hypertension 08/29/2010   MUSCLE CRAMPS, FOOT 06/10/2010   OTHER SLEEP DISTURBANCES  06/10/2010   ANXIETY, SITUATIONAL 05/08/2010   VERTIGO, POSITIONAL 03/15/2010   VITAMIN D DEFICIENCY 02/18/2010   OBESITY 02/18/2010   ALLERGIC RHINITIS 02/18/2010   PLANTAR FASCIITIS 02/18/2010   TWITCHING 06/11/2009   NUMBNESS, HAND 06/11/2009   CERVICAL STRAIN, ACUTE 06/11/2009   OTHER MALAISE AND FATIGUE 07/09/2007   HYPERTENSION 01/28/2007   Hypothyroidism 11/11/2006   GERD 11/11/2006   FIBROMYALGIA 11/11/2006    REFERRING DIAG: Rt breast cancer  THERAPY DIAG: Malignant neoplasm of upper-outer quadrant of right breast in female, estrogen receptor positive (HCC)  Aftercare following surgery for neoplasm  PERTINENT HISTORY:   Patient was diagnosed on 05/22/2020 with 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 She underwent a left lumpectomy for DCIS and a right  lumpectomy and sentinel node biopsy on 07/26/2020. She had 2 lymph nodes removed with 1 being positive for cancer on the right side. It is ER/PR positive. Left shoulder rotator cuff repair 06/2013. Last radiation on 01/15/21.  Seroma that was drained x 2-3.     PRECAUTIONS: Rt UE lymphedema risk  SUBJECTIVE: Pt returns for her 3 month L-Dex screen.   PAIN: Are you having pain? No  SOZO SCREENING: Patient was assessed today using the SOZO machine to determine the lymphedema index score. This was compared to her baseline score. It was determined that she is within the recommended range when compared to her baseline and no further action is needed at this time. She will return in 3 months for her next SOZO screen.  Plan: Pt will begin 6 month screens.   L-DEX FLOWSHEETS - 12/01/22 0900       L-DEX LYMPHEDEMA SCREENING   Measurement Type Unilateral    L-DEX MEASUREMENT EXTREMITY Upper Extremity    POSITION  Standing    DOMINANT SIDE Right    At Risk Side Right    BASELINE SCORE (UNILATERAL) 3.5    L-DEX SCORE (UNILATERAL) 4.3    VALUE CHANGE (UNILAT) 0.8              Gwenevere Abbot, PT  12/01/22 10:00 AM

## 2022-12-04 ENCOUNTER — Encounter: Payer: Self-pay | Admitting: Nurse Practitioner

## 2022-12-04 DIAGNOSIS — N951 Menopausal and female climacteric states: Secondary | ICD-10-CM | POA: Diagnosis not present

## 2022-12-04 DIAGNOSIS — E782 Mixed hyperlipidemia: Secondary | ICD-10-CM | POA: Diagnosis not present

## 2022-12-04 DIAGNOSIS — R5383 Other fatigue: Secondary | ICD-10-CM | POA: Diagnosis not present

## 2022-12-04 DIAGNOSIS — K760 Fatty (change of) liver, not elsewhere classified: Secondary | ICD-10-CM | POA: Diagnosis not present

## 2022-12-04 DIAGNOSIS — E042 Nontoxic multinodular goiter: Secondary | ICD-10-CM | POA: Diagnosis not present

## 2022-12-04 DIAGNOSIS — I10 Essential (primary) hypertension: Secondary | ICD-10-CM | POA: Diagnosis not present

## 2022-12-04 DIAGNOSIS — E039 Hypothyroidism, unspecified: Secondary | ICD-10-CM | POA: Diagnosis not present

## 2022-12-10 ENCOUNTER — Ambulatory Visit
Admission: RE | Admit: 2022-12-10 | Discharge: 2022-12-10 | Disposition: A | Discharge status: Home / Self Care | Payer: Medicare HMO | Source: Ambulatory Visit | Attending: Nurse Practitioner | Admitting: Nurse Practitioner

## 2022-12-10 ENCOUNTER — Other Ambulatory Visit: Payer: Medicare HMO

## 2022-12-10 DIAGNOSIS — Z170 Estrogen receptor positive status [ER+]: Secondary | ICD-10-CM

## 2022-12-10 DIAGNOSIS — D0512 Intraductal carcinoma in situ of left breast: Secondary | ICD-10-CM

## 2022-12-10 DIAGNOSIS — Z853 Personal history of malignant neoplasm of breast: Secondary | ICD-10-CM | POA: Diagnosis not present

## 2022-12-10 MED ORDER — GADOPICLENOL 0.5 MMOL/ML IV SOLN
10.0000 mL | Freq: Once | INTRAVENOUS | Status: AC | PRN
  Administered 2022-12-10: 10 mL via INTRAVENOUS

## 2022-12-11 DIAGNOSIS — D321 Benign neoplasm of spinal meninges: Secondary | ICD-10-CM | POA: Diagnosis not present

## 2022-12-15 DIAGNOSIS — F6089 Other specific personality disorders: Secondary | ICD-10-CM | POA: Diagnosis not present

## 2022-12-17 DIAGNOSIS — M9903 Segmental and somatic dysfunction of lumbar region: Secondary | ICD-10-CM | POA: Diagnosis not present

## 2022-12-17 DIAGNOSIS — M531 Cervicobrachial syndrome: Secondary | ICD-10-CM | POA: Diagnosis not present

## 2022-12-17 DIAGNOSIS — M9902 Segmental and somatic dysfunction of thoracic region: Secondary | ICD-10-CM | POA: Diagnosis not present

## 2022-12-17 DIAGNOSIS — M9901 Segmental and somatic dysfunction of cervical region: Secondary | ICD-10-CM | POA: Diagnosis not present

## 2022-12-22 DIAGNOSIS — Z79899 Other long term (current) drug therapy: Secondary | ICD-10-CM | POA: Diagnosis not present

## 2022-12-22 DIAGNOSIS — G894 Chronic pain syndrome: Secondary | ICD-10-CM | POA: Diagnosis not present

## 2022-12-24 DIAGNOSIS — F6089 Other specific personality disorders: Secondary | ICD-10-CM | POA: Diagnosis not present

## 2023-01-07 DIAGNOSIS — F6089 Other specific personality disorders: Secondary | ICD-10-CM | POA: Diagnosis not present

## 2023-01-07 DIAGNOSIS — M9901 Segmental and somatic dysfunction of cervical region: Secondary | ICD-10-CM | POA: Diagnosis not present

## 2023-01-07 DIAGNOSIS — M531 Cervicobrachial syndrome: Secondary | ICD-10-CM | POA: Diagnosis not present

## 2023-01-07 DIAGNOSIS — M9903 Segmental and somatic dysfunction of lumbar region: Secondary | ICD-10-CM | POA: Diagnosis not present

## 2023-01-07 DIAGNOSIS — M9902 Segmental and somatic dysfunction of thoracic region: Secondary | ICD-10-CM | POA: Diagnosis not present

## 2023-01-19 ENCOUNTER — Telehealth: Payer: Self-pay

## 2023-01-19 ENCOUNTER — Other Ambulatory Visit: Payer: Self-pay

## 2023-01-19 NOTE — Telephone Encounter (Signed)
Pt called wanting the results of her MRI done on 12/10/2022.  Pt stated the results were not given at [facility] at time of test.  Pt wanted to make Santiago Glad, NP aware that she's completed the MRI and would like to speak with Lacie regarding the results.  Stated this nurse will make Lacie aware of her call.

## 2023-01-20 DIAGNOSIS — F6089 Other specific personality disorders: Secondary | ICD-10-CM | POA: Diagnosis not present

## 2023-01-22 DIAGNOSIS — M531 Cervicobrachial syndrome: Secondary | ICD-10-CM | POA: Diagnosis not present

## 2023-01-22 DIAGNOSIS — M9902 Segmental and somatic dysfunction of thoracic region: Secondary | ICD-10-CM | POA: Diagnosis not present

## 2023-01-22 DIAGNOSIS — M9901 Segmental and somatic dysfunction of cervical region: Secondary | ICD-10-CM | POA: Diagnosis not present

## 2023-01-22 DIAGNOSIS — M9903 Segmental and somatic dysfunction of lumbar region: Secondary | ICD-10-CM | POA: Diagnosis not present

## 2023-02-05 DIAGNOSIS — F6089 Other specific personality disorders: Secondary | ICD-10-CM | POA: Diagnosis not present

## 2023-02-12 ENCOUNTER — Other Ambulatory Visit: Payer: Self-pay | Admitting: Internal Medicine

## 2023-02-16 DIAGNOSIS — F6089 Other specific personality disorders: Secondary | ICD-10-CM | POA: Diagnosis not present

## 2023-03-05 DIAGNOSIS — F6089 Other specific personality disorders: Secondary | ICD-10-CM | POA: Diagnosis not present

## 2023-03-09 DIAGNOSIS — R7989 Other specified abnormal findings of blood chemistry: Secondary | ICD-10-CM | POA: Diagnosis not present

## 2023-03-09 DIAGNOSIS — R5383 Other fatigue: Secondary | ICD-10-CM | POA: Diagnosis not present

## 2023-03-09 DIAGNOSIS — E039 Hypothyroidism, unspecified: Secondary | ICD-10-CM | POA: Diagnosis not present

## 2023-03-09 DIAGNOSIS — E782 Mixed hyperlipidemia: Secondary | ICD-10-CM | POA: Diagnosis not present

## 2023-03-09 DIAGNOSIS — K760 Fatty (change of) liver, not elsewhere classified: Secondary | ICD-10-CM | POA: Diagnosis not present

## 2023-03-09 DIAGNOSIS — M26629 Arthralgia of temporomandibular joint, unspecified side: Secondary | ICD-10-CM | POA: Diagnosis not present

## 2023-03-09 DIAGNOSIS — I10 Essential (primary) hypertension: Secondary | ICD-10-CM | POA: Diagnosis not present

## 2023-03-09 DIAGNOSIS — E049 Nontoxic goiter, unspecified: Secondary | ICD-10-CM | POA: Diagnosis not present

## 2023-03-23 DIAGNOSIS — M5417 Radiculopathy, lumbosacral region: Secondary | ICD-10-CM | POA: Diagnosis not present

## 2023-03-23 DIAGNOSIS — M5412 Radiculopathy, cervical region: Secondary | ICD-10-CM | POA: Diagnosis not present

## 2023-03-23 DIAGNOSIS — G603 Idiopathic progressive neuropathy: Secondary | ICD-10-CM | POA: Diagnosis not present

## 2023-03-24 ENCOUNTER — Telehealth: Payer: Self-pay | Admitting: Nurse Practitioner

## 2023-03-24 DIAGNOSIS — D485 Neoplasm of uncertain behavior of skin: Secondary | ICD-10-CM | POA: Diagnosis not present

## 2023-03-24 DIAGNOSIS — H35033 Hypertensive retinopathy, bilateral: Secondary | ICD-10-CM | POA: Diagnosis not present

## 2023-03-24 DIAGNOSIS — L570 Actinic keratosis: Secondary | ICD-10-CM | POA: Diagnosis not present

## 2023-03-24 DIAGNOSIS — L821 Other seborrheic keratosis: Secondary | ICD-10-CM | POA: Diagnosis not present

## 2023-03-24 DIAGNOSIS — H04129 Dry eye syndrome of unspecified lacrimal gland: Secondary | ICD-10-CM | POA: Diagnosis not present

## 2023-03-24 DIAGNOSIS — H524 Presbyopia: Secondary | ICD-10-CM | POA: Diagnosis not present

## 2023-03-25 DIAGNOSIS — F6089 Other specific personality disorders: Secondary | ICD-10-CM | POA: Diagnosis not present

## 2023-03-26 ENCOUNTER — Inpatient Hospital Stay: Payer: Medicare HMO

## 2023-03-26 ENCOUNTER — Inpatient Hospital Stay: Payer: Medicare HMO | Admitting: Nurse Practitioner

## 2023-03-28 ENCOUNTER — Other Ambulatory Visit: Payer: Self-pay | Admitting: Nurse Practitioner

## 2023-03-28 DIAGNOSIS — D0512 Intraductal carcinoma in situ of left breast: Secondary | ICD-10-CM

## 2023-03-28 DIAGNOSIS — Z170 Estrogen receptor positive status [ER+]: Secondary | ICD-10-CM

## 2023-03-28 NOTE — Progress Notes (Unsigned)
Patient Care Team: Panosh, [name], MD as PCP - General Richardean Chimera, MD as Consulting Physician (Obstetrics and Gynecology) Dr Massie Kluver, MD as Consulting Physician (Orthopedic Surgery) Meryl Dare, MD as Consulting Physician (Gastroenterology) Venita Lick, MD as Consulting Physician (Orthopedic Surgery) Donnelly Angelica, RN as Oncology Nurse Navigator Pershing Proud, RN as Oncology Nurse Navigator Frances Miner, MD as Consulting Physician (General Surgery) Malachy Mood, MD as Consulting Physician (Hematology) Lonie Peak, MD as Attending Physician (Radiation Oncology) Elmon Else, MD as Consulting Physician (Dermatology) Caren Griffins, MD as Referring Physician (Cardiology) [name], NP as Nurse Practitioner (Nurse Practitioner) Sherrill Raring, [facility] (Pharmacist)   CHIEF COMPLAINT: 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  Oncology History Overview Note  Cancer Staging Ductal carcinoma in situ (DCIS) of left breast Staging form: Breast, AJCC 8th Edition - Clinical stage from 06/21/2020: Stage 0 (cTis (DCIS), cN0, cM0, G2, ER+, PR+, HER2: Not Assessed) - Signed by Malachy Mood, MD on 06/26/2020 Stage prefix: Initial diagnosis  Malignant neoplasm of upper-outer quadrant of right breast in female, estrogen receptor positive (HCC) Staging form: Breast, AJCC 8th Edition - Clinical stage from 06/21/2020: Stage IA (cT1b, cN0, cM0, G2, ER+, PR+, HER2-) - Signed by Malachy Mood, MD on 06/26/2020 Stage prefix: Initial diagnosis - Pathologic stage from 07/26/2020: Stage IA (pT1c, pN1a, cM0, G2, ER+, PR+, HER2-) - Signed by Malachy Mood, MD on 08/09/2020 Stage prefix: Initial diagnosis Nuclear grade: G2 Histologic grading system: 3 grade system Residual tumor (R): R0 - None    Malignant neoplasm of upper-outer quadrant of right breast in female, estrogen receptor positive (HCC)  06/08/2020 Mammogram   IMPRESSION: 1. 9 x 7 x 6 mm mass in the 12 o'clock position  of the right breast, 2cmfn with imaging features highly suspicious for malignancy. 2. 4 mm group of indeterminate calcifications in the 12 o'clock position of the left breast and 4 mm group of indeterminate calcifications in the 1 o'clock position of the left breast. Together, the groups span an area measuring 3.9 cm.   06/21/2020 Cancer Staging   Staging form: Breast, AJCC 8th Edition - Clinical stage from 06/21/2020: Stage IA (cT1b, cN0, cM0, G2, ER+, PR+, HER2-) - Signed by Malachy Mood, MD on 06/26/2020 Stage prefix: Initial diagnosis   06/21/2020 Initial Biopsy   Diagnosis 1. Breast, right, needle core biopsy, 12 oc - INVASIVE MAMMARY CARCINOMA - MAMMARY CARCINOMA IN SITU - SEE COMMENT 2. Breast, left, needle core biopsy, 12 oc - MAMMARY CARCINOMA IN-SITU WITH NECROSIS AND CALCIFICATIONS - SEE COMMENT 3. Breast, left, needle core biopsy, 1 oc - MAMMARY CARCINOMA IN-SITU WITH NECROSIS AND CALCIFICATIONS - SEE COMMENT Microscopic Comment 1. The biopsy material shows an infiltrative proliferation of cells with large vesicular nuclei with inconspicuous nucleoli, arranged linearly and in small clusters. Based on the biopsy, the carcinoma appears Nottingham grade 2 of 3.  Addendum: 1. E-cadherin is POSITIVE supporting a ductal origin. 2. E-cadherin is POSITIVE supporting a ductal origin.  3. E-cadherin is positive supporting a ductal origin. The focus is less pronounced on the deeper sections and in isolation would likely be considered atypical ductal hyperplasia.   06/21/2020 Receptors her2   1. PROGNOSTIC INDICATORS Results: IMMUNOHISTOCHEMICAL AND MORPHOMETRIC ANALYSIS PERFORMED MANUALLY The tumor cells are EQUIVOCAL for Her2 (2+). HER2 by FISH will be performed and the results reported separately Estrogen Receptor: 95%, POSITIVE, STRONG STAINING INTENSITY Progesterone Receptor: 40%, POSITIVE, MODERATE STAINING INTENSITY Proliferation Marker  Ki67: 10%  1. FLUORESCENCE IN-SITU  HYBRIDIZATION Results: GROUP 5: HER2 **NEGATIVE** Equivocal form of amplification of the HER2 gene was detected in the IHC 2+ tissue sample received from this individual. HER2 FISH was performed by a technologist and cell imaging and analysis on the [employer].   06/25/2020 Initial Diagnosis   Malignant neoplasm of upper-outer quadrant of right breast in female, estrogen receptor positive (HCC)   07/03/2020 Breast MRI   IMPRESSION: 1. Known RIGHT breast cancer, 12 o'clock axis, at anterior depth, measuring 1 cm greatest extent, manifesting as a spiculated enhancing mass on MRI, with associated biopsy clip. Expected post biopsy changes are seen within the adjacent outer RIGHT breast. 2. No evidence of additional multifocal or multicentric disease within the RIGHT breast. 3. Known LEFT breast DCIS within the slightly outer LEFT breast, at anterior depth, corresponding to the biopsy site labeled 1 o'clock axis, with associated enhancement only at the margins of the biopsy cavity measuring up to 5 mm greatest dimension. 4. Known LEFT breast DCIS within the upper central LEFT breast, at middle depth, corresponding to the biopsy site labeled 12 o'clock axis. Contiguous linear non-mass enhancement extends 2.3 cm superior-medial to the biopsy cavity, most likely post biopsy change but possibly contiguous extent of disease. 5. No evidence of additional multifocal or multicentric disease within the LEFT breast. 6. No evidence of metastatic lymphadenopathy.     07/26/2020 Cancer Staging   Staging form: Breast, AJCC 8th Edition - Pathologic stage from 07/26/2020: Stage IA (pT1c, pN1a, cM0, G2, ER+, PR+, HER2-) - Signed by Malachy Mood, MD on 08/09/2020 Stage prefix: Initial diagnosis Nuclear grade: G2 Histologic grading system: 3 grade system Residual tumor (R): R0 - None   07/26/2020 Surgery   RIGHT BREAST LUMPECTOMY WITH RADIOACTIVE SEED AND SENTINEL LYMPH NODE BIOPSY by Dr Carolynne Edouard   07/26/2020 Pathology  Results   FINAL MICROSCOPIC DIAGNOSIS:   A. LYMPH NODE, RIGHT AXILLARY #1, SENTINEL, EXCISION:  - Lymph node, negative for carcinoma (0/1)   B. LYMPH NODE, RIGHT AXILLARY, SENTINEL, EXCISION:  - Benign fibroadipose tissue, negative for carcinoma   C. BREAST, RIGHT, LUMPECTOMY:  - Invasive ductal carcinoma, 1.5 cm, grade 2  - Ductal carcinoma in situ, low grade  - Resection margins are negative for carcinoma; closest is the anterior margin of 0.2 cm  - Biopsy site changes  - See oncology table   D. BREAST, LEFT, LUMPECTOMY:  - Benign breast parenchyma with prominent biopsy-related changes  - Negative for residual ductal carcinoma in situ  - See oncology table   E. LYMPH NODE, RIGHT AXILLARY #2, SENTINEL, EXCISION:  - Invasive ductal carcinoma, see comment  COMMENT:  E.  Lymph node tissue is not identified.  Findings likely represent an entirely replaced lymph node with foci of extranodal extension.    07/26/2020 Miscellaneous   Mammaprint High Risk of Luminla Type B 29% risk of recurrence in 10 years if untreated.  Her Mammaprint index is -0.175 She has 94.6% benefit of chemotherapy and hormaonal therapy.      08/20/2020 Imaging   CT C/A/P IMPRESSION: 1. No definitive imaging findings to suggest metastatic disease in the chest, abdomen or pelvis. 2. Postoperative changes of bilateral lumpectomy and right axillary lymph node dissection with what appears to be a large postoperative seroma in the right axilla, as detailed above. Attention on follow-up studies is recommended to ensure the stability or regression of this collection. 3. Additional incidental findings, as above.   08/28/2020 Imaging   Bone Scan  IMPRESSION: No definite scintigraphic evidence of osseous metastases.   08/30/2020 - 11/02/2020 Chemotherapy   Adjuvant Docetaxel and Cytoxan q3weeks for 4 cycles starting 08/30/20. Given skin rash, changes taxol to Abraxane starting with C2 (09/21/20). Completed 11/02/20.     12/03/2020 - 01/15/2021 Radiation Therapy   Bilateral breast radiation and right regional lymph nodes   02/2021 -  Anti-estrogen oral therapy   Adjuvant exemestane   04/11/2021 Survivorship   SCP delivered by Santiago Glad, NP   Ductal carcinoma in situ (DCIS) of left breast  06/08/2020 Mammogram   IMPRESSION: 1. 9 x 7 x 6 mm mass in the 12 o'clock position of the right breast, 2cmfn with imaging features highly suspicious for malignancy. 2. 4 mm group of indeterminate calcifications in the 12 o'clock position of the left breast and 4 mm group of indeterminate calcifications in the 1 o'clock position of the left breast. Together, the groups span an area measuring 3.9 cm.   06/21/2020 Cancer Staging   Staging form: Breast, AJCC 8th Edition - Clinical stage from 06/21/2020: Stage 0 (cTis (DCIS), cN0, cM0, G2, ER+, PR+, HER2: Not Assessed) - Signed by Malachy Mood, MD on 06/26/2020 Stage prefix: Initial diagnosis   06/21/2020 Initial Biopsy   Diagnosis 1. Breast, right, needle core biopsy, 12 oc - INVASIVE MAMMARY CARCINOMA - MAMMARY CARCINOMA IN SITU - SEE COMMENT 2. Breast, left, needle core biopsy, 12 oc - MAMMARY CARCINOMA IN-SITU WITH NECROSIS AND CALCIFICATIONS - SEE COMMENT 3. Breast, left, needle core biopsy, 1 oc - MAMMARY CARCINOMA IN-SITU WITH NECROSIS AND CALCIFICATIONS - SEE COMMENT Microscopic Comment 1. The biopsy material shows an infiltrative proliferation of cells with large vesicular nuclei with inconspicuous nucleoli, arranged linearly and in small clusters. Based on the biopsy, the carcinoma appears Nottingham grade 2 of 3.  Addendum: 1. E-cadherin is POSITIVE supporting a ductal origin. 2. E-cadherin is POSITIVE supporting a ductal origin.  3. E-cadherin is positive supporting a ductal origin. The focus is less pronounced on the deeper sections and in isolation would likely be considered atypical ductal hyperplasia.   06/21/2020 Receptors her2   2. PROGNOSTIC  INDICATORS Results: IMMUNOHISTOCHEMICAL AND MORPHOMETRIC ANALYSIS PERFORMED MANUALLY Estrogen Receptor: 95%, POSITIVE, STRONG STAINING INTENSITY Progesterone Receptor: 30%, POSITIVE, STRONG STAINING INTENSITY   06/25/2020 Initial Diagnosis   Ductal carcinoma in situ (DCIS) of left breast   07/03/2020 Breast MRI   IMPRESSION: 1. Known RIGHT breast cancer, 12 o'clock axis, at anterior depth, measuring 1 cm greatest extent, manifesting as a spiculated enhancing mass on MRI, with associated biopsy clip. Expected post biopsy changes are seen within the adjacent outer RIGHT breast. 2. No evidence of additional multifocal or multicentric disease within the RIGHT breast. 3. Known LEFT breast DCIS within the slightly outer LEFT breast, at anterior depth, corresponding to the biopsy site labeled 1 o'clock axis, with associated enhancement only at the margins of the biopsy cavity measuring up to 5 mm greatest dimension. 4. Known LEFT breast DCIS within the upper central LEFT breast, at middle depth, corresponding to the biopsy site labeled 12 o'clock axis. Contiguous linear non-mass enhancement extends 2.3 cm superior-medial to the biopsy cavity, most likely post biopsy change but possibly contiguous extent of disease. 5. No evidence of additional multifocal or multicentric disease within the LEFT breast. 6. No evidence of metastatic lymphadenopathy.     07/26/2020 Surgery   LEFT BREAST LUMPECTOMY X 2  WITH RADIOACTIVE SEED LOCALIZATION by Dr Carolynne Edouard   07/26/2020 Pathology Results   FINAL MICROSCOPIC DIAGNOSIS:  A. LYMPH NODE, RIGHT AXILLARY #1, SENTINEL, EXCISION:  - Lymph node, negative for carcinoma (0/1)   B. LYMPH NODE, RIGHT AXILLARY, SENTINEL, EXCISION:  - Benign fibroadipose tissue, negative for carcinoma   C. BREAST, RIGHT, LUMPECTOMY:  - Invasive ductal carcinoma, 1.5 cm, grade 2  - Ductal carcinoma in situ, low grade  - Resection margins are negative for carcinoma; closest is the  anterior margin of 0.2 cm  - Biopsy site changes  - See oncology table   D. BREAST, LEFT, LUMPECTOMY:  - Benign breast parenchyma with prominent biopsy-related changes  - Negative for residual ductal carcinoma in situ  - See oncology table   E. LYMPH NODE, RIGHT AXILLARY #2, SENTINEL, EXCISION:  - Invasive ductal carcinoma, see comment  COMMENT:  E.  Lymph node tissue is not identified.  Findings likely represent an entirely replaced lymph node with foci of extranodal extension.    12/03/2020 - 01/15/2021 Radiation Therapy   Bilateral breast radiation and right regional lymph nodes   02/2021 -  Anti-estrogen oral therapy   Adjuvant exemestane   04/11/2021 Survivorship   SCP delivered by Santiago Glad, NP      CURRENT THERAPY: Exemestane started 02/07/2021, switch to low-dose letrozole 10/22/2021, did not tolerate full dose and stopped anti-estrogen in 01/2022   INTERVAL HISTORY [name] returns for follow up as scheduled, last seen by me 09/22/22. Continues to have soreness at bilateral breast scar tissue unchanged. Her "normal" pain is stable. Still feeling some effects from chemo, mainly with balance while walking on bleachers. Continues work with therapist, PT, integrative medicine, and other care providers for physical/mental/spiritual health.  ROS  All other systems reviewed and negative  Past Medical History:  Diagnosis Date   Anal fissure    Atrial septal aneurysm / if pfo  echo 6 13  10/23/2011   Breast cancer (HCC)    Cataract    both eyes   Depression    Fatty liver    "pre fatty liver"   Fibromyalgia    Headache(784.0)    hx of migraines when younger   Heart palpitations    hx with normal holter event monitoring   Hx: UTI (urinary tract infection)    Hyperlipidemia    Hypertension    Hypothyroidism    Obesity    Personal history of chemotherapy    Personal history of radiation therapy    Pneumonia 1972   hx of   Polyp of colon    Serrated adenoma of  colon 08/2012   Skin cancer    basal, squamous cell   Sleep apnea    uses cpap     Past Surgical History:  Procedure Laterality Date   BREAST LUMPECTOMY     BREAST LUMPECTOMY WITH RADIOACTIVE SEED AND SENTINEL LYMPH NODE BIOPSY Right 07/26/2020   Procedure: RIGHT BREAST LUMPECTOMY WITH RADIOACTIVE SEED AND SENTINEL LYMPH NODE BIOPSY;  Surgeon: Frances Miner, MD;  Location: [facility] OR;  Service: General;  Laterality: Right;   BREAST LUMPECTOMY WITH RADIOACTIVE SEED LOCALIZATION Left 07/26/2020   Procedure: LEFT BREAST LUMPECTOMY X 2  WITH RADIOACTIVE SEED LOCALIZATION;  Surgeon: Frances Miner, MD;  Location: [facility] OR;  Service: General;  Laterality: Left;   COLONOSCOPY     DILATION AND CURETTAGE OF UTERUS  1978   ECTOPIC PREGNANCY SURGERY  1983   KNEE ARTHROSCOPY     both in past   POLYPECTOMY     SHOULDER OPEN ROTATOR CUFF REPAIR Left 07/07/2013   Procedure:  LEFT SHOULDER MINI OPEN SUBACROMIAL DECOMPRESSION ROTATOR CUFF REPAIR AND POSSIBLE PATCH GRAFT ;  Surgeon: Javier Docker, MD;  Location: [facility];  Service: Orthopedics;  Laterality: Left;  with interscaline block   SKIN CANCER EXCISION Bilateral    arm, legs, and chest   TONSILLECTOMY       Outpatient Encounter Medications as of 03/31/2023  Medication Sig Note   ALPRAZolam (XANAX) 0.5 MG tablet Take 0.5 mg by mouth at bedtime as needed.    thyroid (ARMOUR THYROID) 90 MG tablet Take 90 mg by mouth daily. 90 mg Saturday and Sunday and 45 mg Monday through Friday    triamcinolone cream (KENALOG) 0.1 % Apply 1 Application topically.    amLODipine (NORVASC) 10 MG tablet Take 10 mg by mouth daily. 06/20/2022: Taking 1/2 tab qam and 1/2 tab qpm   Ascorbic Acid (VITAMIN C) 1000 MG tablet Take 1,000 mg by mouth daily.    b complex vitamins capsule Take 1 capsule by mouth daily.    Bioflavonoid Products (QUERCETIN COMPLEX IMMUNE PO) Take 1 capsule by mouth as needed.    Cholecalciferol (VITAMIN D) 50 MCG (2000 UT) tablet Take 4,000 Units by  mouth daily.    melatonin 5 MG TABS Take 5 mg by mouth at bedtime.    Menaquinone-7 (VITAMIN K2) 100 MCG CAPS Take 100 mcg by mouth daily.    Multiple Vitamin (MULTIVITAMIN) tablet Take 1 tablet by mouth daily.    OVER THE COUNTER MEDICATION Take 2 capsules by mouth in the morning and at bedtime. Infla Med    OVER THE COUNTER MEDICATION Take 1 capsule by mouth daily. Essential Pro    potassium chloride SA (KLOR-CON M) 20 MEQ tablet TAKE ONE TABLET BY MOUTH ONCE A DAY    Probiotic Product (PROBIOTIC PO) Take 1 capsule by mouth daily.    UBIQUINOL PO Take by mouth.    ZINC GLUCONATE PO Take 10 mg by mouth daily.    [DISCONTINUED] ARMOUR THYROID PO     [DISCONTINUED] hydrocortisone (ANUSOL-HC) 2.5 % rectal cream Place 1 Application rectally 2 (two) times daily.    No facility-administered encounter medications on file as of 03/31/2023.     Today's Vitals   03/31/23 0900 03/31/23 0924  BP:  133/67  Pulse:  78  Resp:  16  Temp:  98 F (36.7 C)  TempSrc:  Oral  SpO2:  100%  Weight:  214 lb 12.8 oz (97.4 kg)  PainSc: 1     Body mass index is 35.2 kg/m.   PHYSICAL EXAM GENERAL:alert, no distress and comfortable SKIN: no rash  EYES: sclera clear NECK: without mass LYMPH:  no palpable cervical or supraclavicular lymphadenopathy  LUNGS:  normal breathing effort HEART: regular rate & rhythm, no lower extremity edema ABDOMEN: abdomen soft, non-tender and normal bowel sounds NEURO: alert & oriented x 3 with fluent speech, no focal motor/sensory deficits Breast exam: No nipple discharge or inversion.  S/p bilateral lumpectomy and radiation, incisions completely healed with mild scar tissue.  No palpable mass or nodularity in either breast or axilla that I could appreciate.     CBC    Component Value Date/Time   WBC 7.0 03/31/2023 0856   WBC 39.4 (H) 09/08/2020 1759   RBC 4.52 03/31/2023 0856   HGB 13.6 03/31/2023 0856   HCT 41.2 03/31/2023 0856   PLT 219 03/31/2023 0856   MCV  91.2 03/31/2023 0856   MCH 30.1 03/31/2023 0856   MCHC 33.0 03/31/2023 0856  RDW 13.4 03/31/2023 0856   LYMPHSABS 1.8 03/31/2023 0856   MONOABS 0.5 03/31/2023 0856   EOSABS 0.1 03/31/2023 0856   BASOSABS 0.1 03/31/2023 0856     CMP     Component Value Date/Time   NA 140 03/31/2023 0856   NA 141 01/09/2020 0000   K 4.2 03/31/2023 0856   CL 106 03/31/2023 0856   CO2 29 03/31/2023 0856   GLUCOSE 89 03/31/2023 0856   BUN 19 03/31/2023 0856   BUN 13 06/05/2020 0000   CREATININE 0.77 03/31/2023 0856   CREATININE 0.86 02/06/2011 0937   CALCIUM 9.4 03/31/2023 0856   PROT 7.1 03/31/2023 0856   ALBUMIN 4.3 03/31/2023 0856   AST 23 03/31/2023 0856   ALT 26 03/31/2023 0856   ALKPHOS 115 03/31/2023 0856   BILITOT 0.8 03/31/2023 0856   GFRNONAA >60 03/31/2023 0856   GFRNONAA >60 02/06/2011 0937   GFRAA 95 01/09/2020 0000   GFRAA >60 02/06/2011 0937     ASSESSMENT & PLAN:[name] is a 67 y.o. female with         1. Malignant neoplasm of upper-outer quadrant of right breast, Stage IB, p(T1cN1aM0), ER+/PR+/HER2-, Grade II AND Left breast DCIS, grade II, ER+/PR+ (removed by biopsy) -diagnosed in 06/2020, s/p b/l lumpectomy and right SNLB by Dr Carolynne Edouard on 07/26/20.  -Mammaprint showed high risk Luminal Type B with 29% risk of distant recurrence.  -She completed adjuvant chemo with TC.  Due to allergy reaction to docetaxel, it was changed to Abraxane from cycle 3. -She received adjuvant radiation under Dr. Basilio Cairo 7/25-01/15/21. -she started exemestane in late 01/2021. She is receiving financial assistance through [employer]" (see MyChart note from 06/11/21). -tolerated exemestane from 03/2021 until 08/2021 with intolerable side effects including generalized bone/joint pain, breast/pectoralis pain, fatigue, brain fog, mood disturbance and elevated BP.   -She switched to low-dose letrozole in 10/22/2021 and increased to full dose in 12/2021 but did not tolerate it after 2-3 weeks and  stopped 01/2022.  Main side effects were severe cramping and toes curling making it difficult to ambulate and poor quality of life.  SEs have improved since stopping AI. She declined to try tamoxifen  -[name] is clinically doing well on surveillance, exam is benign, labs are normal.  Screening MRI 12/10/2022 was benign.  Overall no clinical concern for recurrence  -Continue breast cancer surveillance, next mammo 06/2023 -Follow-up in 6 months, or sooner if needed   2. Bone Health -she reports her last DEXA was in 03/2019, showing osteopenia.  -repeat per Dr. Arelia Sneddon   3. Comorbidities: Depression, Fibromyalgia, HLD, HTN, Hypothyroidism, H/o Skin cancer (squamous and basal cell), sleep apnea -She is followed for HTN. She takes her BP at home.  -On medications, continue to f/u with therapist and other physicians.    4.  Fatty liver and transaminitis -She has chronic intermittent transaminitis, attributes to fatty liver.  She is on low sugar diet, drinks a lot of water, does not drink alcohol, and avoids Tylenol -Liver morphology was unremarkable on staging CT CAP 4/22 -Her LFTs fluctuate, all normal lately -Continue monitoring      PLAN: -Last MRI and today's labs reviewed -Continue breast cancer surveillance -Mammogram 06/2023 -Follow-up in 6 months, or sooner if needed -Continue healthy active lifestyle and age-appropriate health  Orders Placed This Encounter  Procedures   MM DIAG BREAST TOMO BILATERAL    Standing Status:   Future    Standing Expiration Date:   03/30/2024  Order Specific Question:   Reason for Exam (SYMPTOM  OR DIAGNOSIS REQUIRED)    Answer:   h/o R breast cancer and L DCIS 2022    Order Specific Question:   Preferred imaging location?    Answer:   [facility]      All questions were answered. The patient knows to call the clinic with any problems, questions or concerns. No barriers to learning were detected. I spent 20 minutes counseling the patient  face to face. The total time spent in the appointment was 30 minutes and more than 50% was on counseling, review of test results, and coordination of care.   Santiago Glad, NP-C 03/31/2023

## 2023-03-30 ENCOUNTER — Ambulatory Visit: Payer: Medicare HMO | Admitting: Nurse Practitioner

## 2023-03-30 ENCOUNTER — Other Ambulatory Visit: Payer: Medicare HMO

## 2023-03-31 ENCOUNTER — Inpatient Hospital Stay: Payer: Medicare HMO | Attending: Nurse Practitioner

## 2023-03-31 ENCOUNTER — Encounter: Payer: Self-pay | Admitting: Nurse Practitioner

## 2023-03-31 ENCOUNTER — Inpatient Hospital Stay: Payer: Medicare HMO | Admitting: Nurse Practitioner

## 2023-03-31 VITALS — BP 133/67 | HR 78 | Temp 98.0°F | Resp 16 | Wt 214.8 lb

## 2023-03-31 DIAGNOSIS — Z9221 Personal history of antineoplastic chemotherapy: Secondary | ICD-10-CM | POA: Diagnosis not present

## 2023-03-31 DIAGNOSIS — E039 Hypothyroidism, unspecified: Secondary | ICD-10-CM | POA: Insufficient documentation

## 2023-03-31 DIAGNOSIS — D0512 Intraductal carcinoma in situ of left breast: Secondary | ICD-10-CM | POA: Insufficient documentation

## 2023-03-31 DIAGNOSIS — Z85828 Personal history of other malignant neoplasm of skin: Secondary | ICD-10-CM | POA: Diagnosis not present

## 2023-03-31 DIAGNOSIS — Z923 Personal history of irradiation: Secondary | ICD-10-CM | POA: Insufficient documentation

## 2023-03-31 DIAGNOSIS — Z170 Estrogen receptor positive status [ER+]: Secondary | ICD-10-CM | POA: Diagnosis not present

## 2023-03-31 DIAGNOSIS — K760 Fatty (change of) liver, not elsewhere classified: Secondary | ICD-10-CM | POA: Diagnosis not present

## 2023-03-31 DIAGNOSIS — M797 Fibromyalgia: Secondary | ICD-10-CM | POA: Diagnosis not present

## 2023-03-31 DIAGNOSIS — G4730 Sleep apnea, unspecified: Secondary | ICD-10-CM | POA: Diagnosis not present

## 2023-03-31 DIAGNOSIS — C50411 Malignant neoplasm of upper-outer quadrant of right female breast: Secondary | ICD-10-CM | POA: Diagnosis not present

## 2023-03-31 DIAGNOSIS — E785 Hyperlipidemia, unspecified: Secondary | ICD-10-CM | POA: Diagnosis not present

## 2023-03-31 DIAGNOSIS — R21 Rash and other nonspecific skin eruption: Secondary | ICD-10-CM | POA: Diagnosis not present

## 2023-03-31 DIAGNOSIS — I10 Essential (primary) hypertension: Secondary | ICD-10-CM | POA: Diagnosis not present

## 2023-03-31 LAB — CMP (CANCER CENTER ONLY)
ALT: 26 U/L (ref 0–44)
AST: 23 U/L (ref 15–41)
Albumin: 4.3 g/dL (ref 3.5–5.0)
Alkaline Phosphatase: 115 U/L (ref 38–126)
Anion gap: 5 (ref 5–15)
BUN: 19 mg/dL (ref 8–23)
CO2: 29 mmol/L (ref 22–32)
Calcium: 9.4 mg/dL (ref 8.9–10.3)
Chloride: 106 mmol/L (ref 98–111)
Creatinine: 0.77 mg/dL (ref 0.44–1.00)
GFR, Estimated: >60 mL/min (ref >60)
Glucose, Bld: 89 mg/dL (ref 70–99)
Potassium: 4.2 mmol/L (ref 3.5–5.1)
Sodium: 140 mmol/L (ref 135–145)
Total Bilirubin: 0.8 mg/dL (ref <1.2)
Total Protein: 7.1 g/dL (ref 6.5–8.1)

## 2023-03-31 LAB — CBC WITH DIFFERENTIAL (CANCER CENTER ONLY)
Abs Immature Granulocytes: 0.02 10*3/uL (ref 0.00–0.07)
Basophils Absolute: 0.1 10*3/uL (ref 0.0–0.1)
Basophils Relative: 1 %
Eosinophils Absolute: 0.1 10*3/uL (ref 0.0–0.5)
Eosinophils Relative: 2 %
HCT: 41.2 % (ref 36.0–46.0)
Hemoglobin: 13.6 g/dL (ref 12.0–15.0)
Immature Granulocytes: 0 %
Lymphocytes Relative: 26 %
Lymphs Abs: 1.8 10*3/uL (ref 0.7–4.0)
MCH: 30.1 pg (ref 26.0–34.0)
MCHC: 33 g/dL (ref 30.0–36.0)
MCV: 91.2 fL (ref 80.0–100.0)
Monocytes Absolute: 0.5 10*3/uL (ref 0.1–1.0)
Monocytes Relative: 7 %
Neutro Abs: 4.5 10*3/uL (ref 1.7–7.7)
Neutrophils Relative %: 64 %
Platelet Count: 219 10*3/uL (ref 150–400)
RBC: 4.52 MIL/uL (ref 3.87–5.11)
RDW: 13.4 % (ref 11.5–15.5)
WBC Count: 7 10*3/uL (ref 4.0–10.5)
nRBC: 0 % (ref 0.0–0.2)

## 2023-04-02 DIAGNOSIS — F6089 Other specific personality disorders: Secondary | ICD-10-CM | POA: Diagnosis not present

## 2023-04-06 ENCOUNTER — Encounter: Payer: Self-pay | Admitting: Family Medicine

## 2023-04-06 ENCOUNTER — Ambulatory Visit (INDEPENDENT_AMBULATORY_CARE_PROVIDER_SITE_OTHER): Payer: Medicare HMO | Admitting: Family Medicine

## 2023-04-06 VITALS — BP 142/78 | HR 88 | Temp 98.3°F | Ht 65.5 in | Wt 217.9 lb

## 2023-04-06 DIAGNOSIS — I10 Essential (primary) hypertension: Secondary | ICD-10-CM

## 2023-04-06 DIAGNOSIS — R42 Dizziness and giddiness: Secondary | ICD-10-CM | POA: Diagnosis not present

## 2023-04-06 DIAGNOSIS — Z8639 Personal history of other endocrine, nutritional and metabolic disease: Secondary | ICD-10-CM

## 2023-04-06 NOTE — Progress Notes (Signed)
Established Patient Office Visit  Subjective   Patient ID: [name], female    DOB: 05/28/1955  Age: 67 y.o. MRN: [id number]  Chief Complaint  Patient presents with   Spasms    Patient complains of muscle spasms, x4 weeks   Tingling    Patient complains of tingling, x4 weeks    Dizziness    Patient complains of dizziness, x4 weeks   Hand Pain    Patient complains of right hand pain and swelling, x1.5 weeks    HPI    [name] seen as a work in today with some dizziness intermittently.  [name] had episode about a month ago and then again on Sunday.  [name] states [name] felt "off balance".  No true vertigo.  No associated nausea.  After increasing hydration Sunday her symptoms seemed to improve.  [name] is concerned because [name] has had history of hypokalemia in the past.  [name] does take potassium supplement.  In view of her dizziness and some recent muscle cramps and intermittent tingling sensation in extremities [name] was worried [name] had an abnormal K level.  [name] did have labs done 6 days ago per oncology which included normal potassium of 4.2.  [name] has history of hypertension.  Currently on amlodipine 5 mg daily.  Did not tolerate higher dosage.  Blood pressure initially up today but came down significantly with rest and generally well-controlled.  [name] has history of bilateral breast cancer.  Some chronic lymphedema right upper extremity.  [name] has been frustrated with some weight gain recently.  [name] attributes a lot of this to stress eating.  Past Medical History:  Diagnosis Date   Anal fissure    Atrial septal aneurysm / if pfo  echo 6 13  10/23/2011   Breast cancer (HCC)    Cataract    both eyes   Depression    Fatty liver    "pre fatty liver"   Fibromyalgia    Headache(784.0)    hx of migraines when younger   Heart palpitations    hx with normal holter event monitoring   Hx: UTI (urinary tract infection)    Hyperlipidemia    Hypertension    Hypothyroidism    Obesity     Personal history of chemotherapy    Personal history of radiation therapy    Pneumonia 1972   hx of   Polyp of colon    Serrated adenoma of colon 08/2012   Skin cancer    basal, squamous cell   Sleep apnea    uses cpap   Past Surgical History:  Procedure Laterality Date   BREAST LUMPECTOMY     BREAST LUMPECTOMY WITH RADIOACTIVE SEED AND SENTINEL LYMPH NODE BIOPSY Right 07/26/2020   Procedure: RIGHT BREAST LUMPECTOMY WITH RADIOACTIVE SEED AND SENTINEL LYMPH NODE BIOPSY;  Surgeon: Griselda Miner, MD;  Location: [facility];  Service: General;  Laterality: Right;   BREAST LUMPECTOMY WITH RADIOACTIVE SEED LOCALIZATION Left 07/26/2020   Procedure: LEFT BREAST LUMPECTOMY X 2  WITH RADIOACTIVE SEED LOCALIZATION;  Surgeon: Griselda Miner, MD;  Location: [facility];  Service: General;  Laterality: Left;   COLONOSCOPY     DILATION AND CURETTAGE OF UTERUS  1978   ECTOPIC PREGNANCY SURGERY  1983   KNEE ARTHROSCOPY     both in past   POLYPECTOMY     SHOULDER OPEN ROTATOR CUFF REPAIR Left 07/07/2013   Procedure: LEFT SHOULDER MINI OPEN SUBACROMIAL DECOMPRESSION ROTATOR CUFF REPAIR AND POSSIBLE PATCH GRAFT ;  Surgeon: Javier Docker, MD;  Location: [facility];  Service: Orthopedics;  Laterality: Left;  with interscaline block   SKIN CANCER EXCISION Bilateral    arm, legs, and chest   TONSILLECTOMY      reports that [name] has never smoked. [name] has never used smokeless tobacco. [name] reports current alcohol use. [name] reports that [name] does not use drugs. family history includes ADD / ADHD in her child; Cancer in her sister; Hyperlipidemia in her father and another family member; Hypertension in her father and mother; Juvenile Diabetes in her daughter; Liver disease in her father; Melanoma in her sister; Osteoporosis in her mother. Allergies  Allergen Reactions   Penicillins Itching, Swelling and Other (See Comments)    REACTION: swelling up as an adult-67 yrs old REACTION: swelling up as a child   Taxotere  [Docetaxel] Anaphylaxis    08/30/20 Constricted throat sensation of throat and tongue swelling and chest pressure Docetaxel paused and given Pepcid 20 mg IV, Benadryl 25 mg IV, Ativan 0.5 mg IV x1.  Ativan 0.5 mg IV x1 was repeated after the restart of the patient's chemotherapy. The patient's symptoms abated and docetaxel restarted.  Then had a recurrence of constriction of throat and was given Ativan 0.5 mg IV x1 for total dose of 1 mg of Ativan.  [name] was able to complete her treatment with out any further issues or concerns.   Cefdinir Swelling, Itching and Rash    REACTION: Rash, swelling and itching   Cetirizine Other (See Comments)    unsure   Femara [Letrozole] Other (See Comments)    Pt reports severe muscle cramp and exhaustion.    Irbesartan Other (See Comments)    See 1 20 note  Heart burn , dizziness when taken with diuretic  See 1 20 note  Heart burn , dizziness when taken with diuretic    Molds & Smuts     Mold and mildew   Pegfilgrastim     Bad reaction- unspecified    Tylenol [Acetaminophen]     Pre fatty liver: does not takes these    Review of Systems  Eyes:  Negative for blurred vision.  Respiratory:  Negative for shortness of breath.   Cardiovascular:  Negative for chest pain.  Neurological:  Positive for dizziness. Negative for focal weakness, weakness and headaches.      Objective:     BP (!) 142/78 (BP Location: Left Arm, Cuff Size: Large)   Pulse 88   Temp 98.3 F (36.8 C) (Oral)   Ht 5' 5.5" (1.664 m)   Wt 217 lb 14.4 oz (98.8 kg)   LMP  (LMP Unknown)   SpO2 98%   BMI 35.71 kg/m  BP Readings from Last 3 Encounters:  04/06/23 (!) 142/78  03/31/23 133/67  09/22/22 139/77   Wt Readings from Last 3 Encounters:  04/06/23 217 lb 14.4 oz (98.8 kg)  03/31/23 214 lb 12.8 oz (97.4 kg)  10/29/22 211 lb (95.7 kg)      Physical Exam Vitals reviewed.  Constitutional:      Appearance: [name] is well-developed.  HENT:     Right Ear: Tympanic membrane  normal.     Left Ear: Tympanic membrane normal.  Eyes:     Pupils: Pupils are equal, round, and reactive to light.  Neck:     Thyroid: No thyromegaly.     Vascular: No JVD.  Cardiovascular:     Rate and Rhythm: Normal rate and regular rhythm.  Heart sounds:     No gallop.  Pulmonary:     Effort: Pulmonary effort is normal. No respiratory distress.     Breath sounds: Normal breath sounds. No wheezing or rales.  Musculoskeletal:     Cervical back: Neck supple.  Neurological:     General: No focal deficit present.     Mental Status: [name] is alert.     Cranial Nerves: No cranial nerve deficit.     Motor: No weakness.     Coordination: Coordination normal.      No results found for any visits on 04/06/23.  Last CBC Lab Results  Component Value Date   WBC 7.0 03/31/2023   HGB 13.6 03/31/2023   HCT 41.2 03/31/2023   MCV 91.2 03/31/2023   MCH 30.1 03/31/2023   RDW 13.4 03/31/2023   PLT 219 03/31/2023   Last metabolic panel Lab Results  Component Value Date   GLUCOSE 89 03/31/2023   NA 140 03/31/2023   K 4.2 03/31/2023   CL 106 03/31/2023   CO2 29 03/31/2023   BUN 19 03/31/2023   CREATININE 0.77 03/31/2023   GFRNONAA >60 03/31/2023   CALCIUM 9.4 03/31/2023   PROT 7.1 03/31/2023   ALBUMIN 4.3 03/31/2023   BILITOT 0.8 03/31/2023   ALKPHOS 115 03/31/2023   AST 23 03/31/2023   ALT 26 03/31/2023   ANIONGAP 5 03/31/2023      The 10-year ASCVD risk score (Arnett DK, et al., 2019) is: 14.3%* (Cholesterol units were assumed)    Assessment & Plan:   #1 history of hypokalemia.  Patient had recent symptoms of intermittent dizziness, muscle cramps, extremity tingling.  [name] is worried about potential hyperkalemia.  We instructed her that recent potassium through oncology labs was normal at 4.2.  [name] would like to come off recurrent potassium supplement and recheck basic metabolic in 2 weeks.  Future lab order placed.  #2 intermittent dizziness.  Etiology unclear.  Does  not really sound like vertigo.  [name] had a little bit of positional dizziness and wonders if [name] may have been a little volume contracted at the time.  Her symptoms did improve after increase hydration.  #3 Hypertension  Her blood pressure came down from initial 160/68 to 142/78.  Recommend close home monitoring and be in touch if consistently greater than 140/90.  Today's reading is atypical for her.   No follow-ups on file.    Evelena Peat, MD

## 2023-04-16 DIAGNOSIS — F6089 Other specific personality disorders: Secondary | ICD-10-CM | POA: Diagnosis not present

## 2023-04-22 ENCOUNTER — Other Ambulatory Visit (INDEPENDENT_AMBULATORY_CARE_PROVIDER_SITE_OTHER): Payer: Medicare HMO

## 2023-04-22 DIAGNOSIS — Z8639 Personal history of other endocrine, nutritional and metabolic disease: Secondary | ICD-10-CM | POA: Diagnosis not present

## 2023-04-22 LAB — BASIC METABOLIC PANEL
BUN: 21 mg/dL (ref 6–23)
CO2: 27 meq/L (ref 19–32)
Calcium: 8.8 mg/dL (ref 8.4–10.5)
Chloride: 105 meq/L (ref 96–112)
Creatinine, Ser: 0.69 mg/dL (ref 0.40–1.20)
GFR: 88.65 mL/min (ref >60.00)
Glucose, Bld: 78 mg/dL (ref 70–99)
Potassium: 3.8 meq/L (ref 3.5–5.1)
Sodium: 141 meq/L (ref 135–145)

## 2023-04-24 ENCOUNTER — Other Ambulatory Visit: Payer: Medicare HMO

## 2023-04-28 DIAGNOSIS — Z0100 Encounter for examination of eyes and vision without abnormal findings: Secondary | ICD-10-CM | POA: Diagnosis not present

## 2023-04-29 DIAGNOSIS — F6089 Other specific personality disorders: Secondary | ICD-10-CM | POA: Diagnosis not present

## 2023-05-15 DIAGNOSIS — Z03818 Encounter for observation for suspected exposure to other biological agents ruled out: Secondary | ICD-10-CM | POA: Diagnosis not present

## 2023-05-15 DIAGNOSIS — R0981 Nasal congestion: Secondary | ICD-10-CM | POA: Diagnosis not present

## 2023-05-15 DIAGNOSIS — J020 Streptococcal pharyngitis: Secondary | ICD-10-CM | POA: Diagnosis not present

## 2023-05-15 DIAGNOSIS — R051 Acute cough: Secondary | ICD-10-CM | POA: Diagnosis not present

## 2023-05-20 ENCOUNTER — Encounter: Payer: Self-pay | Admitting: Internal Medicine

## 2023-05-20 ENCOUNTER — Ambulatory Visit (INDEPENDENT_AMBULATORY_CARE_PROVIDER_SITE_OTHER): Payer: Medicare HMO | Admitting: Internal Medicine

## 2023-05-20 VITALS — BP 138/86 | HR 79 | Temp 98.1°F | Ht 65.5 in | Wt 215.2 lb

## 2023-05-20 DIAGNOSIS — J22 Unspecified acute lower respiratory infection: Secondary | ICD-10-CM

## 2023-05-20 DIAGNOSIS — R1012 Left upper quadrant pain: Secondary | ICD-10-CM

## 2023-05-20 LAB — POCT URINALYSIS DIPSTICK
Bilirubin, UA: NEGATIVE
Blood, UA: NEGATIVE
Glucose, UA: NEGATIVE
Ketones, UA: NEGATIVE
Leukocytes, UA: NEGATIVE
Nitrite, UA: NEGATIVE
Protein, UA: NEGATIVE
Spec Grav, UA: 1.015 (ref 1.010–1.025)
Urobilinogen, UA: 0.2 U/dL
pH, UA: 8 (ref 5.0–8.0)

## 2023-05-20 NOTE — Patient Instructions (Addendum)
 Lungs are clear  today  Exam is good  could be abdominal wall pain  Ordering [place] of abdomen   for review  If  persistent or progressive we can do more evaluation.  Plan fu after  [place] if  ongoing.  And after illness

## 2023-05-20 NOTE — Progress Notes (Signed)
 Chief Complaint  Patient presents with   Cough    Pt reports uc for strept last Friday. Sx-Lung feels burns when cough, exhausted, fever, drainage, sore throat, bodyache. Currently feels better but still have some congestion and cough. Feel bloated after taking the medications: promethazine  syrup, benzonatate , azithromycin.    Flank Pain    Pt reports her main reason for this visit is left side pain under left rib. Describe burning pain going on for 6 wks.    Nasal Congestion   Bloated    HPI: [name] 68 y.o. come in for  luq side pain for 6 weeks or so  In interim  had resp infection  and seen uc last week  on antibiotic  getting better but still cough  no sob or fever  Rx with   z pack ?  Had pos strep test infection.  Some bloating since on med and illness but no other change in bowel and utd  No hematuria  injury but dose lifting and hx of back issues ms  causes  no assoc NVD other comes and goes  short term and very localized  not sure of triggers . ROS: See pertinent positives and negatives per HPI.  Past Medical History:  Diagnosis Date   Anal fissure    Atrial septal aneurysm / if pfo  echo 6 13  10/23/2011   Breast cancer (HCC)    Cataract    both eyes   Depression    Fatty liver    pre fatty liver   Fibromyalgia    Headache(784.0)    hx of migraines when younger   Heart palpitations    hx with normal holter event monitoring   Hx: UTI (urinary tract infection)    Hyperlipidemia    Hypertension    Hypothyroidism    Obesity    Personal history of chemotherapy    Personal history of radiation therapy    Pneumonia 1972   hx of   Polyp of colon    Serrated adenoma of colon 08/2012   Skin cancer    basal, squamous cell   Sleep apnea    uses cpap    Family History  Problem Relation Age of Onset   Hypertension Mother        low borderline   Osteoporosis Mother    Hypertension Father    Liver disease Father        amyloid deceased    Hyperlipidemia Father    Melanoma Sister    Cancer Sister        melanoma    Juvenile Diabetes Daughter    ADD / ADHD Child    Hyperlipidemia Other        Maternal grandmother   Colon cancer Neg Hx    Stomach cancer Neg Hx    Colon polyps Neg Hx    Esophageal cancer Neg Hx    Rectal cancer Neg Hx     Social History   Socioeconomic History   Marital status: Married    Spouse name: Not on file   Number of children: 6   Years of education: Not on file   Highest education level: Not on file  Occupational History   Occupation: TEACHER ASST    Employer: [employer]  Tobacco Use   Smoking status: Never   Smokeless tobacco: Never  Vaping Use   Vaping status: Never Used  Substance and Sexual Activity   Alcohol  use: Yes  Comment: rare   Drug use: No   Sexual activity: Not on file  Other Topics Concern   Not on file  Social History Narrative   Teachers Aide [employer]  Not workking since last February . 15 left shoulder surgery    Married   Special educ   HH of 4   3 outside dogs, 8 goats, 40 chickens and quail. [place]   Job stresses   Ex husband passes away   Not working after injur at school  Shoulder neck                Social Drivers of Health   Financial Resource Strain: Low Risk  (10/29/2022)   Overall Financial Resource Strain (CARDIA)    Difficulty of Paying Living Expenses: Not hard at all  Food Insecurity: No Food Insecurity (10/29/2022)   Hunger Vital Sign    Worried About Running Out of Food in the Last Year: Never true    Ran Out of Food in the Last Year: Never true  Transportation Needs: No Transportation Needs (10/29/2022)   PRAPARE - Administrator, Civil Service (Medical): No    Lack of Transportation (Non-Medical): No  Physical Activity: Insufficiently Active (10/29/2022)   Exercise Vital Sign    Days of Exercise per Week: 2 days    Minutes of Exercise per Session: 20 min  Stress: No Stress Concern  Present (10/29/2022)   [employer] of Occupational Health - Occupational Stress Questionnaire    Feeling of Stress : Not at all  Social Connections: Socially Integrated (10/29/2022)   Social Connection and Isolation Panel [NHANES]    Frequency of Communication with Friends and Family: More than three times a week    Frequency of Social Gatherings with Friends and Family: More than three times a week    Attends Religious Services: More than 4 times per year    Active Member of [employer] or Organizations: Yes    Attends Engineer, Structural: More than 4 times per year    Marital Status: Married    Outpatient Medications Prior to Visit  Medication Sig Dispense Refill   amLODipine  (NORVASC ) 10 MG tablet Take 10 mg by mouth daily.     Ascorbic Acid  (VITAMIN C ) 1000 MG tablet Take 1,000 mg by mouth daily.     b complex vitamins capsule Take 1 capsule by mouth daily.     Cholecalciferol  (VITAMIN D ) 50 MCG (2000 UT) tablet Take 4,000 Units by mouth daily.     melatonin 5 MG TABS Take 5 mg by mouth at bedtime.     Menaquinone-7 (VITAMIN K2) 100 MCG CAPS Take 100 mcg by mouth daily.     Multiple Vitamin (MULTIVITAMIN) tablet Take 1 tablet by mouth daily.     OVER THE COUNTER MEDICATION Take 2 capsules by mouth in the morning and at bedtime. Infla Med     OVER THE COUNTER MEDICATION Take 1 capsule by mouth daily. Essential Pro     potassium chloride  SA (KLOR-CON  M) 20 MEQ tablet TAKE ONE TABLET BY MOUTH ONCE A DAY 90 tablet 0   thyroid  (ARMOUR THYROID ) 90 MG tablet Take 90 mg by mouth daily. 90 mg Saturday and Sunday and 45 mg Monday through Friday     UBIQUINOL PO Take by mouth.     ALPRAZolam (XANAX) 0.5 MG tablet Take 0.5 mg by mouth at bedtime as needed. (Patient not taking: Reported on 05/20/2023)  Bioflavonoid Products (QUERCETIN COMPLEX IMMUNE PO) Take 1 capsule by mouth as needed. (Patient not taking: Reported on 05/20/2023)     Probiotic Product (PROBIOTIC PO) Take 1 capsule by  mouth daily. (Patient not taking: Reported on 05/20/2023)     triamcinolone  cream (KENALOG) 0.1 % Apply 1 Application topically. (Patient not taking: Reported on 05/20/2023)     ZINC GLUCONATE PO Take 10 mg by mouth daily. (Patient not taking: Reported on 05/20/2023)     No facility-administered medications prior to visit.     EXAM:  BP 138/86 (BP Location: Left Arm, Patient Position: Sitting, Cuff Size: Large)   Pulse 79   Temp 98.1 F (36.7 C) (Oral)   Ht 5' 5.5 (1.664 m)   Wt 215 lb 3.2 oz (97.6 kg)   LMP  (LMP Unknown)   SpO2 98%   BMI 35.27 kg/m   Body mass index is 35.27 kg/m.  GENERAL: vitals reviewed and listed above, alert, oriented, appears well hydrated and in no acute distress congested  but non toxi and well  HEENT: atraumatic, conjunctiva  clear, no obvious abnormalities on inspection of external nose and ears  tmsclear  NECK: no obvious masses on inspection palpation  LUNGS: clear to auscultation bilaterally, no wheezes, rales or rhonchi, good air movement CV: HRRR, no clubbing cyanosis or  peripheral edema nl cap refill  Abdomen:  Sof,t normal bowel sounds without hepatosplenomegaly, no guarding rebound or masses no CVA tenderness local area orf tenderness luq no mass or g or r and no flank pain  MS: moves all extremities without noticeable focal  abnormality PSYCH: pleasant and cooperative, no obvious depression or anxiety Lab Results  Component Value Date   WBC 7.0 03/31/2023   HGB 13.6 03/31/2023   HCT 41.2 03/31/2023   PLT 219 03/31/2023   GLUCOSE 78 04/22/2023   CHOL 226 (A) 06/05/2020   TRIG 56 08/05/2019   HDL 57 06/05/2020   LDLDIRECT 165.2 12/27/2012   LDLCALC 156 06/05/2020   ALT 26 03/31/2023   AST 23 03/31/2023   NA 141 04/22/2023   K 3.8 04/22/2023   CL 105 04/22/2023   CREATININE 0.69 04/22/2023   BUN 21 04/22/2023   CO2 27 04/22/2023   TSH 0.674 04/11/2021   HGBA1C 5.2 06/01/2013   BP Readings from Last 3 Encounters:  05/20/23 138/86   04/06/23 (!) 142/78  03/31/23 133/67   Ua is normal  screen  ASSESSMENT AND PLAN:  Discussed the following assessment and plan:  Left upper quadrant abdominal pain - Plan: US  Abdomen Complete, POC Urinalysis Dipstick  Acute respiratory infection  Just under rib cage  and no mass  effect  and no assic sx  consdier abd wall and or related to ms causes  Not cw diverticulitis   disc imaging option would advise us  for now and go from there . If needed we can do ct  Get past the resp infection   no alarm sx with this and will take time.  Suspect bloating cw illness and antibiotic  med . Will follow  -Patient advised to return or notify health care team  if  new concerns arise.  Patient Instructions  Lungs are clear  today  Exam is good  could be abdominal wall pain  Ordering us  of abdomen   for review  If  persistent or progressive we can do more evaluation.  Plan fu after  US  if  ongoing.  And after illness     Apolinar POUR.  Cricket Goodlin M.D.

## 2023-05-21 ENCOUNTER — Ambulatory Visit
Admission: RE | Admit: 2023-05-21 | Discharge: 2023-05-21 | Disposition: A | Discharge status: Home / Self Care | Payer: Medicare HMO | Source: Ambulatory Visit | Attending: Internal Medicine | Admitting: Internal Medicine

## 2023-05-21 DIAGNOSIS — R1012 Left upper quadrant pain: Secondary | ICD-10-CM | POA: Diagnosis not present

## 2023-05-21 DIAGNOSIS — K824 Cholesterolosis of gallbladder: Secondary | ICD-10-CM | POA: Diagnosis not present

## 2023-05-22 DIAGNOSIS — D2272 Melanocytic nevi of left lower limb, including hip: Secondary | ICD-10-CM | POA: Diagnosis not present

## 2023-05-22 DIAGNOSIS — D2372 Other benign neoplasm of skin of left lower limb, including hip: Secondary | ICD-10-CM | POA: Diagnosis not present

## 2023-05-22 DIAGNOSIS — D2271 Melanocytic nevi of right lower limb, including hip: Secondary | ICD-10-CM | POA: Diagnosis not present

## 2023-05-22 DIAGNOSIS — D225 Melanocytic nevi of trunk: Secondary | ICD-10-CM | POA: Diagnosis not present

## 2023-05-22 DIAGNOSIS — Z86018 Personal history of other benign neoplasm: Secondary | ICD-10-CM | POA: Diagnosis not present

## 2023-05-22 DIAGNOSIS — D2261 Melanocytic nevi of right upper limb, including shoulder: Secondary | ICD-10-CM | POA: Diagnosis not present

## 2023-05-22 DIAGNOSIS — L578 Other skin changes due to chronic exposure to nonionizing radiation: Secondary | ICD-10-CM | POA: Diagnosis not present

## 2023-05-22 DIAGNOSIS — L570 Actinic keratosis: Secondary | ICD-10-CM | POA: Diagnosis not present

## 2023-05-22 DIAGNOSIS — Z85828 Personal history of other malignant neoplasm of skin: Secondary | ICD-10-CM | POA: Diagnosis not present

## 2023-05-22 DIAGNOSIS — L821 Other seborrheic keratosis: Secondary | ICD-10-CM | POA: Diagnosis not present

## 2023-05-25 ENCOUNTER — Ambulatory Visit: Payer: Medicare HMO | Attending: Nurse Practitioner

## 2023-05-25 VITALS — Wt 217.0 lb

## 2023-05-25 DIAGNOSIS — Z483 Aftercare following surgery for neoplasm: Secondary | ICD-10-CM | POA: Insufficient documentation

## 2023-05-25 NOTE — Therapy (Signed)
 OUTPATIENT PHYSICAL THERAPY SOZO SCREENING NOTE   Patient Name: [name] MRN: [id number] DOB:1956/04/08, 68 y.o., female Today's Date: 05/25/2023  PCP: Charlett Apolinar POUR, MD REFERRING PROVIDER: Ann Mayme POUR, NP   PT End of Session - 05/25/23 1011     Visit Number 9   # unchanged due to screen only   PT Start Time 1009    PT Stop Time 1013    PT Time Calculation (min) 4 min    Activity Tolerance Patient tolerated treatment well    Behavior During Therapy WFL for tasks assessed/performed             Past Medical History:  Diagnosis Date   Anal fissure    Atrial septal aneurysm / if pfo  echo 6 13  10/23/2011   Breast cancer (HCC)    Cataract    both eyes   Depression    Fatty liver    pre fatty liver   Fibromyalgia    Headache(784.0)    hx of migraines when younger   Heart palpitations    hx with normal holter event monitoring   Hx: UTI (urinary tract infection)    Hyperlipidemia    Hypertension    Hypothyroidism    Obesity    Personal history of chemotherapy    Personal history of radiation therapy    Pneumonia 1972   hx of   Polyp of colon    Serrated adenoma of colon 08/2012   Skin cancer    basal, squamous cell   Sleep apnea    uses cpap   Past Surgical History:  Procedure Laterality Date   BREAST LUMPECTOMY     BREAST LUMPECTOMY WITH RADIOACTIVE SEED AND SENTINEL LYMPH NODE BIOPSY Right 07/26/2020   Procedure: RIGHT BREAST LUMPECTOMY WITH RADIOACTIVE SEED AND SENTINEL LYMPH NODE BIOPSY;  Surgeon: Curvin Deward MOULD, MD;  Location: [facility];  Service: General;  Laterality: Right;   BREAST LUMPECTOMY WITH RADIOACTIVE SEED LOCALIZATION Left 07/26/2020   Procedure: LEFT BREAST LUMPECTOMY X 2  WITH RADIOACTIVE SEED LOCALIZATION;  Surgeon: Curvin Deward MOULD, MD;  Location: [facility];  Service: General;  Laterality: Left;   COLONOSCOPY     DILATION AND CURETTAGE OF UTERUS  1978   ECTOPIC PREGNANCY SURGERY  1983   KNEE ARTHROSCOPY     both in past    POLYPECTOMY     SHOULDER OPEN ROTATOR CUFF REPAIR Left 07/07/2013   Procedure: LEFT SHOULDER MINI OPEN SUBACROMIAL DECOMPRESSION ROTATOR CUFF REPAIR AND POSSIBLE PATCH GRAFT ;  Surgeon: Reyes JAYSON Billing, MD;  Location: [facility];  Service: Orthopedics;  Laterality: Left;  with interscaline block   SKIN CANCER EXCISION Bilateral    arm, legs, and chest   TONSILLECTOMY     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   Hepatic steatosis 11/27/2014   Hx of adenomatous colonic polyps 11/27/2014   Visit for preventive health examination 03/28/2014   Hyperlipidemia 03/28/2014   Left rotator cuff tear 07/07/2013   Tear of left rotator cuff 07/07/2013   Rhinitis 06/01/2013   Chest wall deformity 04/12/2012   Atrial septal aneurysm / if pfo  echo 6 13  10/23/2011   OSA (obstructive sleep apnea) 11/27/2010   Dyslipidemia 11/01/2010   Flushing 08/29/2010   Labile hypertension 08/29/2010  MUSCLE CRAMPS, FOOT 06/10/2010   OTHER SLEEP DISTURBANCES 06/10/2010   ANXIETY, SITUATIONAL 05/08/2010   VERTIGO, POSITIONAL 03/15/2010   VITAMIN D  DEFICIENCY 02/18/2010   OBESITY 02/18/2010   ALLERGIC RHINITIS 02/18/2010   PLANTAR FASCIITIS 02/18/2010   TWITCHING 06/11/2009   NUMBNESS, HAND 06/11/2009   CERVICAL STRAIN, ACUTE 06/11/2009   OTHER MALAISE AND FATIGUE 07/09/2007   HYPERTENSION 01/28/2007   Hypothyroidism 11/11/2006   GERD 11/11/2006   FIBROMYALGIA 11/11/2006    REFERRING DIAG: Rt breast cancer  THERAPY DIAG: Aftercare following surgery for neoplasm  PERTINENT HISTORY:   Patient was diagnosed on 05/22/2020 with 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 She underwent a left lumpectomy for DCIS and a right lumpectomy and sentinel node biopsy on  07/26/2020. She had 2 lymph nodes removed with 1 being positive for cancer on the right side. It is ER/PR positive. Left shoulder rotator cuff repair 06/2013. Last radiation on 01/15/21.  Seroma that was drained x 2-3.     PRECAUTIONS: Rt UE lymphedema risk  SUBJECTIVE: Pt returns for her 3 month L-Dex screen.   PAIN: Are you having pain? No  SOZO SCREENING: Patient was assessed today using the SOZO machine to determine the lymphedema index score. This was compared to her baseline score. It was determined that she is within the recommended range when compared to her baseline and no further action is needed at this time. She will return in 3 months for her next SOZO screen.  Plan: Pt will begin 6 month screens.   L-DEX FLOWSHEETS - 05/25/23 1000       L-DEX LYMPHEDEMA SCREENING   Measurement Type Unilateral    L-DEX MEASUREMENT EXTREMITY Upper Extremity    POSITION  Standing    DOMINANT SIDE Right    At Risk Side Right    BASELINE SCORE (UNILATERAL) 3.5    L-DEX SCORE (UNILATERAL) 5.5    VALUE CHANGE (UNILAT) 2              Berwyn Knights, PTA 05/25/23 10:13 AM

## 2023-05-26 ENCOUNTER — Ambulatory Visit: Payer: Medicare HMO | Admitting: Internal Medicine

## 2023-05-27 ENCOUNTER — Encounter: Payer: Self-pay | Admitting: Internal Medicine

## 2023-05-27 NOTE — Progress Notes (Signed)
 Nothing to explain the left side pain   Gall baldder polyps   imaging should be repeated in 6 months  to ensure not growing ( but  not causing your sx)  Kidney looks fine  .  Plan fu sx in 2-4 weeks and if still bothersome we could get Gi to see or other imagining considering .  K please order  US  abd in 6 mos for fu gall bladder polyps )

## 2023-05-27 NOTE — Telephone Encounter (Signed)
 Seem result note  give more time and then fu if ongoing  otherwise order 6 mos fu ultrasound to minitor GB polyps

## 2023-05-28 ENCOUNTER — Other Ambulatory Visit: Payer: Self-pay

## 2023-05-28 DIAGNOSIS — M4722 Other spondylosis with radiculopathy, cervical region: Secondary | ICD-10-CM | POA: Diagnosis not present

## 2023-05-28 DIAGNOSIS — K824 Cholesterolosis of gallbladder: Secondary | ICD-10-CM

## 2023-05-29 ENCOUNTER — Telehealth: Payer: Self-pay | Admitting: Internal Medicine

## 2023-05-29 ENCOUNTER — Telehealth: Payer: Self-pay

## 2023-05-29 NOTE — Telephone Encounter (Signed)
Spoke to pt today at 10am. Please see image result note message.

## 2023-05-29 NOTE — Telephone Encounter (Signed)
Spoke to [place] and explain to her the [place] order is for a follow up.   She verbalized understanding. She updates her co-worker already scheduled a 49month appt in July.   No further action is needed.

## 2023-05-29 NOTE — Telephone Encounter (Signed)
Another ultrasound of the abdomen  was sent over to [facility] again patient has already had one done  jan 9th   kay  from [facility] is wanting to know why patient needs another scan she would like a call back

## 2023-05-29 NOTE — Telephone Encounter (Signed)
Spoke to pt. Please see result note

## 2023-05-29 NOTE — Telephone Encounter (Signed)
Copied from CRM [id number]. Topic: Clinical - Lab/Test Results >> May 29, 2023  9:05 AM [place] A wrote: Reason for CRM: Patient said she missed the call from the nurse regarding her test results-please call

## 2023-05-30 DIAGNOSIS — M1711 Unilateral primary osteoarthritis, right knee: Secondary | ICD-10-CM | POA: Diagnosis not present

## 2023-06-02 DIAGNOSIS — F6089 Other specific personality disorders: Secondary | ICD-10-CM | POA: Diagnosis not present

## 2023-06-04 ENCOUNTER — Encounter: Payer: Self-pay | Admitting: Internal Medicine

## 2023-06-10 DIAGNOSIS — M1711 Unilateral primary osteoarthritis, right knee: Secondary | ICD-10-CM | POA: Diagnosis not present

## 2023-06-11 DIAGNOSIS — F6089 Other specific personality disorders: Secondary | ICD-10-CM | POA: Diagnosis not present

## 2023-06-15 ENCOUNTER — Ambulatory Visit
Admission: RE | Admit: 2023-06-15 | Discharge: 2023-06-15 | Disposition: A | Discharge status: Home / Self Care | Payer: Medicare HMO | Source: Ambulatory Visit | Attending: Nurse Practitioner | Admitting: Nurse Practitioner

## 2023-06-15 DIAGNOSIS — E039 Hypothyroidism, unspecified: Secondary | ICD-10-CM | POA: Diagnosis not present

## 2023-06-15 DIAGNOSIS — N951 Menopausal and female climacteric states: Secondary | ICD-10-CM | POA: Diagnosis not present

## 2023-06-15 DIAGNOSIS — Z853 Personal history of malignant neoplasm of breast: Secondary | ICD-10-CM | POA: Diagnosis not present

## 2023-06-15 DIAGNOSIS — R7989 Other specified abnormal findings of blood chemistry: Secondary | ICD-10-CM | POA: Diagnosis not present

## 2023-06-15 DIAGNOSIS — K760 Fatty (change of) liver, not elsewhere classified: Secondary | ICD-10-CM | POA: Diagnosis not present

## 2023-06-15 DIAGNOSIS — Z170 Estrogen receptor positive status [ER+]: Secondary | ICD-10-CM

## 2023-06-15 DIAGNOSIS — D0512 Intraductal carcinoma in situ of left breast: Secondary | ICD-10-CM

## 2023-06-15 DIAGNOSIS — R5383 Other fatigue: Secondary | ICD-10-CM | POA: Diagnosis not present

## 2023-06-16 ENCOUNTER — Ambulatory Visit: Payer: Medicare HMO | Admitting: Internal Medicine

## 2023-06-16 ENCOUNTER — Encounter: Payer: Self-pay | Admitting: Internal Medicine

## 2023-06-16 ENCOUNTER — Ambulatory Visit (INDEPENDENT_AMBULATORY_CARE_PROVIDER_SITE_OTHER): Payer: Medicare HMO | Admitting: Internal Medicine

## 2023-06-16 VITALS — BP 138/78 | HR 88 | Temp 98.1°F | Ht 65.5 in | Wt 215.2 lb

## 2023-06-16 DIAGNOSIS — R1012 Left upper quadrant pain: Secondary | ICD-10-CM | POA: Diagnosis not present

## 2023-06-16 DIAGNOSIS — K824 Cholesterolosis of gallbladder: Secondary | ICD-10-CM

## 2023-06-16 DIAGNOSIS — R140 Abdominal distension (gaseous): Secondary | ICD-10-CM

## 2023-06-16 NOTE — Patient Instructions (Addendum)
Gall bladder polyp should be followed in 6 mos for monitoring.not causing the sx.   Will refer  to GI about opinion about  localized intermittent left abd pain.   Not alarming  previously .

## 2023-06-16 NOTE — Progress Notes (Signed)
 Chief Complaint  Patient presents with   Follow-up    Pt reports  she is following up for abdominal pain. Pt reports sometimes laying on r side, feels the pain. Pt reports sometimes acid food caused stomach pain. Pt added her sx is off and on.     HPI: [name] 68 y.o. come in for FU of luft upper abd or side pain  Acted like abd wall  type  pain  with context but atypical  and worse siteh bloating and if eats a lot ( not often)  Recovered from  resp illness.   Worse if overeats  still some bloating  sx   despite   better from  resp illness . Described as a burning pain under  rib cage left mid clavicular line intermittent . ROS: See pertinent positives and negatives per HPI.  Past Medical History:  Diagnosis Date   Anal fissure    Atrial septal aneurysm / if pfo  echo 6 13  10/23/2011   Breast cancer (HCC)    Cataract    both eyes   Depression    Fatty liver    pre fatty liver   Fibromyalgia    Headache(784.0)    hx of migraines when younger   Heart palpitations    hx with normal holter event monitoring   Hx: UTI (urinary tract infection)    Hyperlipidemia    Hypertension    Hypothyroidism    Obesity    Personal history of chemotherapy    Personal history of radiation therapy    Pneumonia 1972   hx of   Polyp of colon    Serrated adenoma of colon 08/2012   Skin cancer    basal, squamous cell   Sleep apnea    uses cpap    Family History  Problem Relation Age of Onset   Hypertension Mother        low borderline   Osteoporosis Mother    Hypertension Father    Liver disease Father        amyloid deceased   Hyperlipidemia Father    Melanoma Sister    Cancer Sister        melanoma    Juvenile Diabetes Daughter    ADD / ADHD Child    Hyperlipidemia Other        Maternal grandmother   Colon cancer Neg Hx    Stomach cancer Neg Hx    Colon polyps Neg Hx    Esophageal cancer Neg Hx    Rectal cancer Neg Hx     Social History    Socioeconomic History   Marital status: Married    Spouse name: Not on file   Number of children: 6   Years of education: Not on file   Highest education level: Not on file  Occupational History   Occupation: TEACHER ASST    Employer: [employer]  Tobacco Use   Smoking status: Never   Smokeless tobacco: Never  Vaping Use   Vaping status: Never Used  Substance and Sexual Activity   Alcohol  use: Yes    Comment: rare   Drug use: No   Sexual activity: Not on file  Other Topics Concern   Not on file  Social History Narrative   Teachers Aide [employer]  Not workking since last February . 15 left shoulder surgery    Married   Special educ   HH of 4   3 outside dogs, 8  goats, 40 chickens and quail. [place]   Job stresses   Ex husband passes away   Not working after injur at school  Shoulder neck                Social Drivers of Health   Financial Resource Strain: Low Risk  (10/29/2022)   Overall Financial Resource Strain (CARDIA)    Difficulty of Paying Living Expenses: Not hard at all  Food Insecurity: No Food Insecurity (10/29/2022)   Hunger Vital Sign    Worried About Running Out of Food in the Last Year: Never true    Ran Out of Food in the Last Year: Never true  Transportation Needs: No Transportation Needs (10/29/2022)   PRAPARE - Administrator, Civil Service (Medical): No    Lack of Transportation (Non-Medical): No  Physical Activity: Insufficiently Active (10/29/2022)   Exercise Vital Sign    Days of Exercise per Week: 2 days    Minutes of Exercise per Session: 20 min  Stress: No Stress Concern Present (10/29/2022)   [employer] of Occupational Health - Occupational Stress Questionnaire    Feeling of Stress : Not at all  Social Connections: Socially Integrated (10/29/2022)   Social Connection and Isolation Panel [NHANES]    Frequency of Communication with Friends and Family: More than three times a week     Frequency of Social Gatherings with Friends and Family: More than three times a week    Attends Religious Services: More than 4 times per year    Active Member of [employer] or Organizations: Yes    Attends Engineer, Structural: More than 4 times per year    Marital Status: Married    Outpatient Medications Prior to Visit  Medication Sig Dispense Refill   amLODipine  (NORVASC ) 10 MG tablet Take 10 mg by mouth daily.     Ascorbic Acid  (VITAMIN C ) 1000 MG tablet Take 1,000 mg by mouth daily.     b complex vitamins capsule Take 1 capsule by mouth daily.     Cholecalciferol  (VITAMIN D ) 50 MCG (2000 UT) tablet Take 4,000 Units by mouth daily.     melatonin 5 MG TABS Take 5 mg by mouth at bedtime.     Menaquinone-7 (VITAMIN K2) 100 MCG CAPS Take 100 mcg by mouth daily.     Multiple Vitamin (MULTIVITAMIN) tablet Take 1 tablet by mouth daily.     OVER THE COUNTER MEDICATION Take 2 capsules by mouth in the morning and at bedtime. Infla Med     OVER THE COUNTER MEDICATION Take 1 capsule by mouth daily. Essential Pro     potassium chloride  SA (KLOR-CON  M) 20 MEQ tablet TAKE ONE TABLET BY MOUTH ONCE A DAY (Patient taking differently: Take 20 mEq by mouth daily. Taking 1/2) 90 tablet 0   thyroid  (ARMOUR THYROID ) 90 MG tablet Take 90 mg by mouth daily. 90 mg Saturday and Sunday and 45 mg Monday through Friday     ALPRAZolam (XANAX) 0.5 MG tablet Take 0.5 mg by mouth at bedtime as needed. (Patient not taking: Reported on 06/16/2023)     Bioflavonoid Products (QUERCETIN COMPLEX IMMUNE PO) Take 1 capsule by mouth as needed. (Patient not taking: Reported on 05/20/2023)     Probiotic Product (PROBIOTIC PO) Take 1 capsule by mouth daily. (Patient not taking: Reported on 05/20/2023)     triamcinolone  cream (KENALOG) 0.1 % Apply 1 Application topically. (Patient not taking: Reported on 06/16/2023)  UBIQUINOL PO Take by mouth. (Patient not taking: Reported on 06/16/2023)     ZINC GLUCONATE PO Take 10 mg by mouth  daily. (Patient not taking: Reported on 05/20/2023)     No facility-administered medications prior to visit.     EXAM:  BP 138/78 (BP Location: Left Arm, Patient Position: Sitting, Cuff Size: Large)   Pulse 88   Temp 98.1 F (36.7 C) (Oral)   Ht 5' 5.5 (1.664 m)   Wt 215 lb 3.2 oz (97.6 kg)   LMP  (LMP Unknown)   SpO2 97%   BMI 35.27 kg/m   Body mass index is 35.27 kg/m.  GENERAL: vitals reviewed and listed above, alert, oriented, appears well hydrated and in no acute distress HEENT: atraumatic, conjunctiva  clear, no obvious abnormalities on inspection of external nose and ears OP : no lesion edema or exudate  Abd points to  luq location  and no radiation  no mass standing  MS: moves all extremities without noticeable focal  abnormality PSYCH: pleasant and cooperative, no obvious depression or anxiety Lab Results  Component Value Date   WBC 7.0 03/31/2023   HGB 13.6 03/31/2023   HCT 41.2 03/31/2023   PLT 219 03/31/2023   GLUCOSE 78 04/22/2023   CHOL 226 (A) 06/05/2020   TRIG 56 08/05/2019   HDL 57 06/05/2020   LDLDIRECT 165.2 12/27/2012   LDLCALC 156 06/05/2020   ALT 26 03/31/2023   AST 23 03/31/2023   NA 141 04/22/2023   K 3.8 04/22/2023   CL 105 04/22/2023   CREATININE 0.69 04/22/2023   BUN 21 04/22/2023   CO2 27 04/22/2023   TSH 0.674 04/11/2021   HGBA1C 5.2 06/01/2013   BP Readings from Last 3 Encounters:  06/16/23 138/78  05/20/23 138/86  04/06/23 (!) 142/78   IMPRESSION: 1. No acute abnormality identified. 2. Heterogeneous increased echotexture of liver, nonspecific but can be seen in fatty infiltration of liver. 3. There are 2 polyps within the gallbladder, largest measures 7.3 mm. Recommend follow-up ultrasound in 6 months.     Electronically Signed   By: Craig Farr M.D.   On: 05/21/2023 11:44 ASSESSMENT AND PLAN:  Discussed the following assessment and plan:  Left upper quadrant abdominal pain - Plan: Ambulatory referral to  Gastroenterology  Gall bladder polyps - to have fu US   in 6 months  summer to ensure stability - Plan: Ambulatory referral to Gastroenterology  Bloating - Plan: Ambulatory referral to Gastroenterology Atypical  uncertain cause has some characteristics aof abd wall and other of intra abdominal with burning level  pain location and bloating sx . Is utd on  colon screening  Not related to the resp illness as predated . Past hx of breast cancer  and rx  Prev patient of dr Aneita who retired  wil refer to new provider  -Patient advised to return or notify health care team  if  new concerns arise.  Patient Instructions  Gall bladder polyp should be followed in 6 mos for monitoring.not causing the sx.   Will refer  to GI about opinion about  localized intermittent left abd pain.   Not alarming  previously .     Cleburn Maiolo K. Yarden Manuelito M.D.

## 2023-06-19 ENCOUNTER — Emergency Department (HOSPITAL_BASED_OUTPATIENT_CLINIC_OR_DEPARTMENT_OTHER): Payer: Medicare HMO

## 2023-06-19 ENCOUNTER — Encounter (HOSPITAL_BASED_OUTPATIENT_CLINIC_OR_DEPARTMENT_OTHER): Payer: Self-pay | Admitting: Emergency Medicine

## 2023-06-19 ENCOUNTER — Emergency Department (HOSPITAL_BASED_OUTPATIENT_CLINIC_OR_DEPARTMENT_OTHER)
Admission: EM | Admit: 2023-06-19 | Discharge: 2023-06-19 | Disposition: A | Discharge status: Home / Self Care | Payer: Medicare HMO | Attending: Emergency Medicine | Admitting: Emergency Medicine

## 2023-06-19 ENCOUNTER — Other Ambulatory Visit: Payer: Self-pay

## 2023-06-19 ENCOUNTER — Telehealth: Payer: Self-pay | Admitting: Physician Assistant

## 2023-06-19 DIAGNOSIS — K824 Cholesterolosis of gallbladder: Secondary | ICD-10-CM | POA: Diagnosis not present

## 2023-06-19 DIAGNOSIS — N281 Cyst of kidney, acquired: Secondary | ICD-10-CM | POA: Diagnosis not present

## 2023-06-19 DIAGNOSIS — D259 Leiomyoma of uterus, unspecified: Secondary | ICD-10-CM | POA: Insufficient documentation

## 2023-06-19 DIAGNOSIS — R1013 Epigastric pain: Secondary | ICD-10-CM | POA: Diagnosis present

## 2023-06-19 DIAGNOSIS — R1010 Upper abdominal pain, unspecified: Secondary | ICD-10-CM | POA: Diagnosis not present

## 2023-06-19 DIAGNOSIS — N289 Disorder of kidney and ureter, unspecified: Secondary | ICD-10-CM | POA: Diagnosis not present

## 2023-06-19 DIAGNOSIS — I10 Essential (primary) hypertension: Secondary | ICD-10-CM | POA: Diagnosis not present

## 2023-06-19 LAB — COMPREHENSIVE METABOLIC PANEL
ALT: 29 U/L (ref 0–44)
AST: 24 U/L (ref 15–41)
Albumin: 4.1 g/dL (ref 3.5–5.0)
Alkaline Phosphatase: 104 U/L (ref 38–126)
Anion gap: 8 (ref 5–15)
BUN: 18 mg/dL (ref 8–23)
CO2: 28 mmol/L (ref 22–32)
Calcium: 9.4 mg/dL (ref 8.9–10.3)
Chloride: 103 mmol/L (ref 98–111)
Creatinine, Ser: 0.65 mg/dL (ref 0.44–1.00)
GFR, Estimated: >60 mL/min (ref >60)
Glucose, Bld: 93 mg/dL (ref 70–99)
Potassium: 3.9 mmol/L (ref 3.5–5.1)
Sodium: 139 mmol/L (ref 135–145)
Total Bilirubin: 0.7 mg/dL (ref 0.0–1.2)
Total Protein: 7.2 g/dL (ref 6.5–8.1)

## 2023-06-19 LAB — CBC
HCT: 42.2 % (ref 36.0–46.0)
Hemoglobin: 14 g/dL (ref 12.0–15.0)
MCH: 30 pg (ref 26.0–34.0)
MCHC: 33.2 g/dL (ref 30.0–36.0)
MCV: 90.4 fL (ref 80.0–100.0)
Platelets: 216 10*3/uL (ref 150–400)
RBC: 4.67 MIL/uL (ref 3.87–5.11)
RDW: 13.9 % (ref 11.5–15.5)
WBC: 6.8 10*3/uL (ref 4.0–10.5)
nRBC: 0 % (ref 0.0–0.2)

## 2023-06-19 LAB — URINALYSIS, ROUTINE W REFLEX MICROSCOPIC
Bilirubin Urine: NEGATIVE
Glucose, UA: NEGATIVE mg/dL
Hgb urine dipstick: NEGATIVE
Ketones, ur: NEGATIVE mg/dL
Leukocytes,Ua: NEGATIVE
Nitrite: NEGATIVE
Protein, ur: NEGATIVE mg/dL
Specific Gravity, Urine: <1.005 — ABNORMAL LOW (ref 1.005–1.030)
pH: 6.5 (ref 5.0–8.0)

## 2023-06-19 LAB — LIPASE, BLOOD: Lipase: 23 U/L (ref 11–51)

## 2023-06-19 MED ORDER — ALUM & MAG HYDROXIDE-SIMETH 200-200-20 MG/5ML PO SUSP
30.0000 mL | Freq: Once | ORAL | Status: AC
  Administered 2023-06-19: 30 mL via ORAL
  Filled 2023-06-19: qty 30

## 2023-06-19 MED ORDER — PANTOPRAZOLE SODIUM 40 MG PO TBEC: 40.0000 mg | DELAYED_RELEASE_TABLET | Freq: Every day | ORAL | 0 refills | Status: DC

## 2023-06-19 MED ORDER — FAMOTIDINE 20 MG PO TABS
20.0000 mg | ORAL_TABLET | Freq: Once | ORAL | Status: AC
  Administered 2023-06-19: 20 mg via ORAL
  Filled 2023-06-19: qty 1

## 2023-06-19 MED ORDER — IOHEXOL 300 MG/ML  SOLN
100.0000 mL | Freq: Once | INTRAMUSCULAR | Status: AC | PRN
  Administered 2023-06-19: 100 mL via INTRAVENOUS

## 2023-06-19 NOTE — Telephone Encounter (Signed)
 I spoke to the pt and she tells me that she has left side abd pain that has been evaluated by her PCP.  She states she was told she has gallbladder polyp and fatty liver.  She was told to follow up with GI.  She was last seen in 02/2022 for hemorrhoids and colon with Dr Aneita.  Polyps and hemorrhoids seen.  Recall colon 7 years.  She has asked that an MRI is ordered.  I did make her aware that she will need to keep the appt that she has setup with Delon Failing for eval and to discuss what she may need further.  She has been added to the wait list in case there is an opening.  The pt has been advised of the information and verbalized understanding.

## 2023-06-19 NOTE — Discharge Instructions (Addendum)
 It was our pleasure to provide your ER care today - we hope that you feel better.  Drink plenty of fluids/stay well hydrated.   Take protonix  (acid blocker medication) as prescribed.   Follow up closely with your doctor/GI doctor in the next 1-2 weeks.   Return to ER if worse, new symptoms, new or worsening or severe abdominal pain, persistent vomiting, fevers, chest pain, trouble breathing, or other concern.

## 2023-06-19 NOTE — Telephone Encounter (Signed)
 Inbound call from patient, has an appointment 3/13 at 10:00 AM with Delon. She states the pain in her upper left abdomen is not subsiding. Patient is requesting for Delon to order an MRI prior to appointment so results can be in before. She states she already had an ultrasound done by her PCP and would also like for her to review it.

## 2023-06-19 NOTE — ED Provider Notes (Signed)
 [place] [facility] AT [facility] Provider Note   CSN: [id number] Arrival date & time: 06/19/23  1139     History  Chief Complaint  Patient presents with   Abdominal Pain    [name] is a 68 y.o. female.  Pt c/o pain to upper abdomen in past 3-4 months. Dull pain, epigastric and left upper abd. Dull, non radiating, at rest, without consistent exacerbating or alleviating factors. No nausea/vomiting. Having normal bms. No hx pud or gallstones. Pt notes recent u/s for same, w gb polyps but no acute process. Has plans to f/u with gi doctor in give weeks. No back/flank pain. No lower abd or pelvic pain. No dysuria or hematuria. No chest pain or discomfort. No sob. No cough or uri symptoms. No fever or chills.   The history is provided by the patient and medical records.  Abdominal Pain Associated symptoms: no chest pain, no chills, no cough, no diarrhea, no dysuria, no fever, no shortness of breath, no sore throat and no vomiting        Home Medications Prior to Admission medications   Medication Sig Start Date End Date Taking? Authorizing Provider  pantoprazole  (PROTONIX ) 40 MG tablet Take 1 tablet (40 mg total) by mouth daily. 06/19/23  Yes Dornell Grasmick, MD  ALPRAZolam (XANAX) 0.5 MG tablet Take 0.5 mg by mouth at bedtime as needed. Patient not taking: Reported on 06/16/2023 06/17/22   [provider]  amLODipine  (NORVASC ) 10 MG tablet Take 10 mg by mouth daily. 03/22/21   [provider]  Ascorbic Acid  (VITAMIN C ) 1000 MG tablet Take 1,000 mg by mouth daily.    [provider]  b complex vitamins capsule Take 1 capsule by mouth daily.    [provider]  Bioflavonoid Products (QUERCETIN COMPLEX IMMUNE PO) Take 1 capsule by mouth as needed. Patient not taking: Reported on 05/20/2023    [provider]  Cholecalciferol  (VITAMIN D ) 50 MCG (2000 UT) tablet Take 4,000 Units by mouth daily.    [provider]   melatonin 5 MG TABS Take 5 mg by mouth at bedtime.    [provider]  Menaquinone-7 (VITAMIN K2) 100 MCG CAPS Take 100 mcg by mouth daily.    [provider]  Multiple Vitamin (MULTIVITAMIN) tablet Take 1 tablet by mouth daily.    [provider]  OVER THE COUNTER MEDICATION Take 2 capsules by mouth in the morning and at bedtime. Infla Med    [provider]  OVER THE COUNTER MEDICATION Take 1 capsule by mouth daily. Essential Pro    [provider]  potassium chloride  SA (KLOR-CON  M) 20 MEQ tablet TAKE ONE TABLET BY MOUTH ONCE A DAY Patient taking differently: Take 20 mEq by mouth daily. Taking 1/2 02/12/23   Webb, Padonda B, FNP  Probiotic Product (PROBIOTIC PO) Take 1 capsule by mouth daily. Patient not taking: Reported on 05/20/2023    [provider]  thyroid  (ARMOUR THYROID ) 90 MG tablet Take 90 mg by mouth daily. 90 mg Saturday and Sunday and 45 mg Monday through Friday    [provider]  triamcinolone  cream (KENALOG) 0.1 % Apply 1 Application topically. Patient not taking: Reported on 06/16/2023 03/24/23   [provider]  UBIQUINOL PO Take by mouth. Patient not taking: Reported on 06/16/2023    [provider]  ZINC GLUCONATE PO Take 10 mg by mouth daily. Patient not taking: Reported on 05/20/2023    [provider]  Allergies    Penicillins, Taxotere  [docetaxel ], Cefdinir, Cetirizine, Femara  [letrozole ], Irbesartan , Molds & smuts, Pegfilgrastim , and Tylenol  [acetaminophen ]    Review of Systems   Review of Systems  Constitutional:  Negative for chills and fever.  HENT:  Negative for sore throat.   Eyes:  Negative for redness.  Respiratory:  Negative for cough and shortness of breath.   Cardiovascular:  Negative for chest pain and leg swelling.  Gastrointestinal:  Positive for abdominal pain. Negative for diarrhea and vomiting.  Genitourinary:  Negative for dysuria and flank pain.   Musculoskeletal:  Negative for back pain.  Skin:  Negative for rash.  Neurological:  Negative for headaches.    Physical Exam Updated Vital Signs BP (!) 159/67 (BP Location: Left Arm)   Pulse 60   Temp 98 F (36.7 C) (Oral)   Resp 16   Ht 1.651 m (5' 5)   Wt 97 kg   LMP  (LMP Unknown)   SpO2 99%   BMI 35.59 kg/m  Physical Exam Vitals and nursing note reviewed.  Constitutional:      Appearance: Normal appearance. She is well-developed.  HENT:     Head: Atraumatic.     Nose: Nose normal.     Mouth/Throat:     Mouth: Mucous membranes are moist.  Eyes:     General: No scleral icterus.    Conjunctiva/sclera: Conjunctivae normal.  Neck:     Trachea: No tracheal deviation.  Cardiovascular:     Rate and Rhythm: Normal rate and regular rhythm.     Pulses: Normal pulses.     Heart sounds: Normal heart sounds. No murmur heard.    No friction rub. No gallop.  Pulmonary:     Effort: Pulmonary effort is normal. No respiratory distress.     Breath sounds: Normal breath sounds.  Abdominal:     General: Bowel sounds are normal. There is no distension.     Palpations: Abdomen is soft. There is no mass.     Tenderness: There is abdominal tenderness. There is no guarding or rebound.     Hernia: No hernia is present.     Comments: Mild epigastric/luq tenderness. No sts. No mass.   Genitourinary:    Comments: No cva tenderness.  Musculoskeletal:        General: No swelling or tenderness.     Cervical back: Normal range of motion and neck supple. No rigidity. No muscular tenderness.  Skin:    General: Skin is warm and dry.     Findings: No rash.  Neurological:     Mental Status: She is alert.     Comments: Alert, speech normal.   Psychiatric:        Mood and Affect: Mood normal.     ED Results / Procedures / Treatments   Labs (all labs ordered are listed, but only abnormal results are displayed) Results for orders placed or performed during the hospital encounter of  06/19/23  Lipase, blood   Collection Time: 06/19/23 11:49 AM  Result Value Ref Range   Lipase 23 11 - 51 U/L  Comprehensive metabolic panel   Collection Time: 06/19/23 11:49 AM  Result Value Ref Range   Sodium 139 135 - 145 mmol/L   Potassium 3.9 3.5 - 5.1 mmol/L   Chloride 103 98 - 111 mmol/L   CO2 28 22 - 32 mmol/L   Glucose, Bld 93 70 - 99 mg/dL   BUN 18 8 - 23 mg/dL   Creatinine, Ser 9.34 0.44 -  1.00 mg/dL   Calcium 9.4 8.9 - 89.6 mg/dL   Total Protein 7.2 6.5 - 8.1 g/dL   Albumin 4.1 3.5 - 5.0 g/dL   AST 24 15 - 41 U/L   ALT 29 0 - 44 U/L   Alkaline Phosphatase 104 38 - 126 U/L   Total Bilirubin 0.7 0.0 - 1.2 mg/dL   GFR, Estimated >39 >39 mL/min   Anion gap 8 5 - 15  CBC   Collection Time: 06/19/23 11:49 AM  Result Value Ref Range   WBC 6.8 4.0 - 10.5 K/uL   RBC 4.67 3.87 - 5.11 MIL/uL   Hemoglobin 14.0 12.0 - 15.0 g/dL   HCT 57.7 63.9 - 53.9 %   MCV 90.4 80.0 - 100.0 fL   MCH 30.0 26.0 - 34.0 pg   MCHC 33.2 30.0 - 36.0 g/dL   RDW 86.0 88.4 - 84.4 %   Platelets 216 150 - 400 K/uL   nRBC 0.0 0.0 - 0.2 %  Urinalysis, Routine w reflex microscopic -Urine, Clean Catch   Collection Time: 06/19/23 11:49 AM  Result Value Ref Range   Color, Urine COLORLESS (A) YELLOW   APPearance CLEAR CLEAR   Specific Gravity, Urine <1.005 (L) 1.005 - 1.030   pH 6.5 5.0 - 8.0   Glucose, UA NEGATIVE NEGATIVE mg/dL   Hgb urine dipstick NEGATIVE NEGATIVE   Bilirubin Urine NEGATIVE NEGATIVE   Ketones, ur NEGATIVE NEGATIVE mg/dL   Protein, ur NEGATIVE NEGATIVE mg/dL   Nitrite NEGATIVE NEGATIVE   Leukocytes,Ua NEGATIVE NEGATIVE   CT ABDOMEN PELVIS W CONTRAST Result Date: 06/19/2023 CLINICAL DATA:  Non localized upper abdominal pain since November EXAM: CT ABDOMEN AND PELVIS WITH CONTRAST TECHNIQUE: Multidetector CT imaging of the abdomen and pelvis was performed using the standard protocol following bolus administration of intravenous contrast. RADIATION DOSE REDUCTION: This exam was  performed according to the departmental dose-optimization program which includes automated exposure control, adjustment of the mA and/or kV according to patient size and/or use of iterative reconstruction technique. CONTRAST:  OMNIPAQUE  IOHEXOL  300 MG/ML  SOLN COMPARISON:  05/21/2023 abdominal ultrasound.  CTA of 09/05/2020 FINDINGS: Lower chest: Clear lung bases. Normal heart size without pericardial or pleural effusion. Hepatobiliary: Normal liver. 6 mm soft tissue density in the nondependent gallbladder including on 21/2. Pancreas: Fatty replaced pancreatic head and uncinate process. No duct dilatation or acute inflammation. Spleen: Normal in size, without focal abnormality. Adrenals/Urinary Tract: Normal adrenal glands. Bilateral renal too small to characterize lesions. Lower pole left renal 2.3 cm cyst . In the absence of clinically indicated signs/symptoms require(s) no independent follow-up. No hydronephrosis. Normal urinary bladder. Stomach/Bowel: Gastric antral underdistention. Normal colon, appendix, and terminal ileum. Normal small bowel. Vascular/Lymphatic: Aortic atherosclerosis. No abdominopelvic adenopathy. Reproductive: Uterine fundal 2 cm fibroid on 70/2.  No adnexal mass. Other: Moderate pelvic floor laxity. Pessary in place. No significant free fluid. No free intraperitoneal air. Musculoskeletal: No acute osseous abnormality. IMPRESSION: 1.  No acute process or explanation for abdominal pain. 2. 6 mL soft tissue density in the nondependent gallbladder likely represents a polyp when correlated with 05/21/2023 ultrasound. Please see that report. 3. Small uterine fibroid 4.  Aortic Atherosclerosis (ICD10-I70.0). 5. 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 Electronically Signed   By: Rockey Kilts M.D.   On: 06/19/2023 15:30   MM DIAG BREAST TOMO BILATERAL Result Date: 06/15/2023 CLINICAL DATA:  68 year old female presenting for routine annual surveillance status post bilateral lumpectomies  in 2022. EXAM: DIGITAL DIAGNOSTIC BILATERAL MAMMOGRAM  WITH TOMOSYNTHESIS AND CAD TECHNIQUE: Bilateral digital diagnostic mammography and breast tomosynthesis was performed. The images were evaluated with computer-aided detection. COMPARISON:  Previous exam(s). ACR Breast Density Category b: There are scattered areas of fibroglandular density. FINDINGS: The right and left lumpectomy sites are stable. No suspicious calcifications, masses or areas of distortion are seen in the bilateral breasts. IMPRESSION: Stable bilateral breast lumpectomy sites. No mammographic evidence of malignancy in the bilateral breasts. RECOMMENDATION: Per protocol, as the patient is now 2 or more years status post lumpectomy, she may return to annual screening mammography in 1 year. However, given the history of breast cancer, the patient remains eligible for annual diagnostic mammography if preferred. I have discussed the findings and recommendations with the patient. If applicable, a reminder letter will be sent to the patient regarding the next appointment. BI-RADS CATEGORY  2: Benign. Electronically Signed   By: Rosaline Collet M.D.   On: 06/15/2023 14:02   US  Abdomen Complete Result Date: 05/21/2023 CLINICAL DATA:  Left upper quadrant abdomen pain. History of breast cancer. EXAM: ABDOMEN ULTRASOUND COMPLETE COMPARISON:  None Available. FINDINGS: Gallbladder: There are 2 polyps within the gallbladder, largest measures 7.3 mm. No wall thickening visualized. No sonographic Murphy sign noted by sonographer. Common bile duct: Diameter: 3 mm. Liver: No focal lesion identified. Heterogeneous increased echotexture of liver. Portal vein is patent on color Doppler imaging with normal direction of blood flow towards the liver. IVC: No abnormality visualized. Pancreas: Not well seen due to overlying bowel gas per ultrasound technologist. Spleen: Size and appearance within normal limits. Right Kidney: Length: 10.8 cm. Echogenicity within normal  limits. No mass or hydronephrosis visualized. Left Kidney: Length: 10.8 cm. Echogenicity within normal limits. No hydronephrosis visualized. 3.9 x 3.1 x 2.7 cm simple cyst noted. No follow-up is recommended. Abdominal aorta: No aneurysm visualized. Other findings: None. IMPRESSION: 1. No acute abnormality identified. 2. Heterogeneous increased echotexture of liver, nonspecific but can be seen in fatty infiltration of liver. 3. There are 2 polyps within the gallbladder, largest measures 7.3 mm. Recommend follow-up ultrasound in 6 months. Electronically Signed   By: Craig Farr M.D.   On: 05/21/2023 11:44     EKG EKG Interpretation Date/Time:  Friday June 19 2023 11:50:20 EST Ventricular Rate:  73 PR Interval:  168 QRS Duration:  102 QT Interval:  390 QTC Calculation: 429 R Axis:   163  Text Interpretation: Normal sinus rhythm Right axis deviation Incomplete right bundle branch block Nonspecific T wave abnormality Confirmed by Bernard Drivers ([id number]) on 06/19/2023 12:45:33 PM  Radiology CT ABDOMEN PELVIS W CONTRAST Result Date: 06/19/2023 CLINICAL DATA:  Non localized upper abdominal pain since November EXAM: CT ABDOMEN AND PELVIS WITH CONTRAST TECHNIQUE: Multidetector CT imaging of the abdomen and pelvis was performed using the standard protocol following bolus administration of intravenous contrast. RADIATION DOSE REDUCTION: This exam was performed according to the departmental dose-optimization program which includes automated exposure control, adjustment of the mA and/or kV according to patient size and/or use of iterative reconstruction technique. CONTRAST:  OMNIPAQUE  IOHEXOL  300 MG/ML  SOLN COMPARISON:  05/21/2023 abdominal ultrasound.  CTA of 09/05/2020 FINDINGS: Lower chest: Clear lung bases. Normal heart size without pericardial or pleural effusion. Hepatobiliary: Normal liver. 6 mm soft tissue density in the nondependent gallbladder including on 21/2. Pancreas: Fatty replaced  pancreatic head and uncinate process. No duct dilatation or acute inflammation. Spleen: Normal in size, without focal abnormality. Adrenals/Urinary Tract: Normal adrenal glands. Bilateral renal too small to characterize lesions.  Lower pole left renal 2.3 cm cyst . In the absence of clinically indicated signs/symptoms require(s) no independent follow-up. No hydronephrosis. Normal urinary bladder. Stomach/Bowel: Gastric antral underdistention. Normal colon, appendix, and terminal ileum. Normal small bowel. Vascular/Lymphatic: Aortic atherosclerosis. No abdominopelvic adenopathy. Reproductive: Uterine fundal 2 cm fibroid on 70/2.  No adnexal mass. Other: Moderate pelvic floor laxity. Pessary in place. No significant free fluid. No free intraperitoneal air. Musculoskeletal: No acute osseous abnormality. IMPRESSION: 1.  No acute process or explanation for abdominal pain. 2. 6 mL soft tissue density in the nondependent gallbladder likely represents a polyp when correlated with 05/21/2023 ultrasound. Please see that report. 3. Small uterine fibroid 4.  Aortic Atherosclerosis (ICD10-I70.0). 5. 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 Electronically Signed   By: Rockey Kilts M.D.   On: 06/19/2023 15:30    Procedures Procedures    Medications Ordered in ED Medications  famotidine  (PEPCID ) tablet 20 mg (20 mg Oral Given 06/19/23 1328)  alum & mag hydroxide-simeth (MAALOX/MYLANTA) 200-200-20 MG/5ML suspension 30 mL (30 mLs Oral Given 06/19/23 1328)  iohexol  (OMNIPAQUE ) 300 MG/ML solution 100 mL (100 mLs Intravenous Contrast Given 06/19/23 1356)    ED Course/ Medical Decision Making/ A&P                                 Medical Decision Making Problems Addressed: Gallbladder polyp: chronic illness or injury    Details: Has f/u already arranged Upper abdominal pain: acute illness or injury with systemic symptoms that poses a threat to life or bodily functions    Details: Acute/chronic  Amount and/or Complexity  of Data Reviewed External Data Reviewed: notes. Labs: ordered. Decision-making details documented in ED Course. Radiology: ordered and independent interpretation performed. Decision-making details documented in ED Course. ECG/medicine tests: ordered and independent interpretation performed. Decision-making details documented in ED Course.  Risk OTC drugs. Prescription drug management. Decision regarding hospitalization.   Labs ordered/sent. Imaging ordered.   Differential diagnosis includes pud, pancreatitis, biliary colic, gastritis, etc. Dispo decision including potential need for admission considered - will get labs and imaging and reassess.   Reviewed nursing notes and prior charts for additional history. External reports reviewed.   Pepcid  po. Maalox po.   Labs reviewed/interpreted by me - wbc and hgb normal. Lipase normal. Chem unremarkable.   CT reviewed/interpreted by me - no acute process. Gb polyp (known, pt has f/u).   Pt appears comfortable, nad, and appears stable for d/c.   Rec close pcp f/u.  Return precautions provided.          Final Clinical Impression(s) / ED Diagnoses Final diagnoses:  Upper abdominal pain  Gallbladder polyp    Rx / DC Orders ED Discharge Orders          Ordered    pantoprazole  (PROTONIX ) 40 MG tablet  Daily        06/19/23 1538              Bernard Drivers, MD 06/19/23 1539

## 2023-06-19 NOTE — ED Triage Notes (Signed)
 C/o upper abd pain since November but has worsen over the last two days. States it feels like a "cutting sensation". Denies CP or SHOB.

## 2023-06-25 DIAGNOSIS — F6089 Other specific personality disorders: Secondary | ICD-10-CM | POA: Diagnosis not present

## 2023-07-07 DIAGNOSIS — G894 Chronic pain syndrome: Secondary | ICD-10-CM | POA: Diagnosis not present

## 2023-07-07 DIAGNOSIS — Z79899 Other long term (current) drug therapy: Secondary | ICD-10-CM | POA: Diagnosis not present

## 2023-07-07 DIAGNOSIS — Z6836 Body mass index (BMI) 36.0-36.9, adult: Secondary | ICD-10-CM | POA: Diagnosis not present

## 2023-07-07 DIAGNOSIS — Z01419 Encounter for gynecological examination (general) (routine) without abnormal findings: Secondary | ICD-10-CM | POA: Diagnosis not present

## 2023-07-09 DIAGNOSIS — F6089 Other specific personality disorders: Secondary | ICD-10-CM | POA: Diagnosis not present

## 2023-07-23 ENCOUNTER — Encounter: Payer: Self-pay | Admitting: Physician Assistant

## 2023-07-23 ENCOUNTER — Ambulatory Visit: Payer: Medicare HMO | Admitting: Physician Assistant

## 2023-07-23 VITALS — BP 150/80 | HR 95 | Ht 65.0 in | Wt 220.0 lb

## 2023-07-23 DIAGNOSIS — K824 Cholesterolosis of gallbladder: Secondary | ICD-10-CM | POA: Diagnosis not present

## 2023-07-23 DIAGNOSIS — F418 Other specified anxiety disorders: Secondary | ICD-10-CM | POA: Diagnosis not present

## 2023-07-23 DIAGNOSIS — R1012 Left upper quadrant pain: Secondary | ICD-10-CM | POA: Diagnosis not present

## 2023-07-23 DIAGNOSIS — K760 Fatty (change of) liver, not elsewhere classified: Secondary | ICD-10-CM

## 2023-07-23 NOTE — Progress Notes (Signed)
 Chief Complaint: Left upper quadrant pain  HPI:    [name] is a 68 year old female with a past medical history as listed below including depression, fibromyalgia and multiple others, previously known to Dr. Russella Dar, who was referred to me by Panosh, [name], MD for a complaint of left upper quadrant pain.      03/10/2022 colonoscopy for survey once of adenomatous polyps with one 7 mm polyp in the descending colon, 2 2-3 mm polyps in the transverse colon, small external hemorrhoids and skin tags as well as small internal hemorrhoids.  Pathology showed adenomatous polyps and repeat recommended in 7 years.    05/21/2023 abdominal ultrasound with heterogenous increased echotexture of the liver, nonspecific but can be seen in fatty infiltration, 2 polyps in the gallbladder the largest 7.3 mm.  Recommend repeat ultrasound in 6 months.    06/19/2023 CT abdomen pelvis with contrast showed no acute process.  6 mm soft tissue density in the nondependent gallbladder likely represented a polyp.  Small uterine fibroid.    06/19/2023 CBC and CMP as well as lipase normal.    Today, the patient presents to clinic and describes that she has been having left upper quadrant pain over the past couple of months, she has been seeing her homeopathic doctor and was told this was likely gastritis or something in her stomach given that this pain occurs worse with acidic foods.  Describes it as a burning within seconds of eating.  90% of the time she does not eat the wrong thing she can avoid this pain altogether but if she does not eat right then she can have issues.  Has also noticed that sometimes if she lays in the right position she can feel this discomfort as well.  She was given Pantoprazole but never took this medication for fear of side effects.  She would like to know what is going on.    Does describe some anxiety related to a history of breast cancer.  Also tells me she had increased stress/anxiety over the past year  with some marital issues and gained about 20 pounds.    Denies fever, chills or weight loss.  Past Medical History:  Diagnosis Date   Anal fissure    Atrial septal aneurysm / if pfo  echo 6 13  10/23/2011   Breast cancer (HCC)    Cataract    both eyes   Depression    Fatty liver    "pre fatty liver"   Fibromyalgia    Headache(784.0)    hx of migraines when younger   Heart palpitations    hx with normal holter event monitoring   Hx: UTI (urinary tract infection)    Hyperlipidemia    Hypertension    Hypothyroidism    Obesity    Personal history of chemotherapy    Personal history of radiation therapy    Pneumonia 1972   hx of   Polyp of colon    Serrated adenoma of colon 08/2012   Skin cancer    basal, squamous cell   Sleep apnea    uses cpap    Past Surgical History:  Procedure Laterality Date   BREAST LUMPECTOMY     BREAST LUMPECTOMY WITH RADIOACTIVE SEED AND SENTINEL LYMPH NODE BIOPSY Right 07/26/2020   Procedure: RIGHT BREAST LUMPECTOMY WITH RADIOACTIVE SEED AND SENTINEL LYMPH NODE BIOPSY;  Surgeon: Griselda Miner, MD;  Location: [facility] OR;  Service: General;  Laterality: Right;   BREAST LUMPECTOMY WITH RADIOACTIVE SEED  LOCALIZATION Left 07/26/2020   Procedure: LEFT BREAST LUMPECTOMY X 2  WITH RADIOACTIVE SEED LOCALIZATION;  Surgeon: Griselda Miner, MD;  Location: [facility] OR;  Service: General;  Laterality: Left;   COLONOSCOPY     DILATION AND CURETTAGE OF UTERUS  1978   ECTOPIC PREGNANCY SURGERY  1983   KNEE ARTHROSCOPY     both in past   POLYPECTOMY     SHOULDER OPEN ROTATOR CUFF REPAIR Left 07/07/2013   Procedure: LEFT SHOULDER MINI OPEN SUBACROMIAL DECOMPRESSION ROTATOR CUFF REPAIR AND POSSIBLE PATCH GRAFT ;  Surgeon: Javier Docker, MD;  Location: [facility];  Service: Orthopedics;  Laterality: Left;  with interscaline block   SKIN CANCER EXCISION Bilateral    arm, legs, and chest   TONSILLECTOMY      Current Outpatient Medications  Medication Sig Dispense Refill    amLODipine (NORVASC) 10 MG tablet Take 10 mg by mouth daily.     Ascorbic Acid (VITAMIN C) 1000 MG tablet Take 1,000 mg by mouth daily.     b complex vitamins capsule Take 1 capsule by mouth daily.     Bioflavonoid Products (QUERCETIN COMPLEX IMMUNE PO) Take 1 capsule by mouth as needed.     Cholecalciferol (VITAMIN D) 50 MCG (2000 UT) tablet Take 4,000 Units by mouth daily.     melatonin 5 MG TABS Take 5 mg by mouth at bedtime.     Menaquinone-7 (VITAMIN K2) 100 MCG CAPS Take 100 mcg by mouth daily.     Multiple Vitamin (MULTIVITAMIN) tablet Take 1 tablet by mouth daily.     OVER THE COUNTER MEDICATION Take 2 capsules by mouth in the morning and at bedtime. Infla Med     OVER THE COUNTER MEDICATION Take 1 capsule by mouth daily. Essential Pro     potassium chloride SA (KLOR-CON M) 20 MEQ tablet TAKE ONE TABLET BY MOUTH ONCE A DAY (Patient taking differently: Take 20 mEq by mouth daily. Taking 1/2) 90 tablet 0   Probiotic Product (PROBIOTIC PO) Take 1 capsule by mouth daily.     thyroid (ARMOUR THYROID) 90 MG tablet Take 90 mg by mouth daily. 90 mg Saturday and Sunday and 45 mg Monday through Friday     triamcinolone cream (KENALOG) 0.1 % Apply 1 Application topically.     UBIQUINOL PO Take by mouth.     ZINC GLUCONATE PO Take 10 mg by mouth daily.     ALPRAZolam (XANAX) 0.5 MG tablet Take 0.5 mg by mouth at bedtime as needed. (Patient not taking: Reported on 05/20/2023)     pantoprazole (PROTONIX) 40 MG tablet Take 1 tablet (40 mg total) by mouth daily. (Patient not taking: Reported on 07/23/2023) 30 tablet 0   No current facility-administered medications for this visit.    Allergies as of 07/23/2023 - Review Complete 07/23/2023  Allergen Reaction Noted   Penicillins Itching, Swelling, and Other (See Comments) 01/17/1977   Taxotere [docetaxel] Anaphylaxis 09/10/2020   Cefdinir Swelling, Itching, and Rash 04/04/2009   Cetirizine Other (See Comments) 09/03/2017   Femara [letrozole] Other (See  Comments) 02/03/2022   Irbesartan Other (See Comments) 05/19/2018   Molds & smuts  09/05/2019   Pegfilgrastim  09/17/2020   Tylenol [acetaminophen]  07/04/2013    Family History  Problem Relation Age of Onset   Hypertension Mother        low borderline   Osteoporosis Mother    Hypertension Father    Liver disease Father  amyloid deceased   Hyperlipidemia Father    Melanoma Sister    Cancer Sister        melanoma    Juvenile Diabetes Daughter    ADD / ADHD Child    Hyperlipidemia Other        Maternal grandmother   Colon cancer Neg Hx    Stomach cancer Neg Hx    Colon polyps Neg Hx    Esophageal cancer Neg Hx    Rectal cancer Neg Hx     Social History   Socioeconomic History   Marital status: Married    Spouse name: Not on file   Number of children: 6   Years of education: Not on file   Highest education level: Not on file  Occupational History   Occupation: TEACHER ASST    Employer: [employer]  Tobacco Use   Smoking status: Never   Smokeless tobacco: Never  Vaping Use   Vaping status: Never Used  Substance and Sexual Activity   Alcohol use: Yes    Comment: rare   Drug use: No   Sexual activity: Not on file  Other Topics Concern   Not on file  Social History Narrative   Teachers Aide [employer]  Not workking since last February . 15 left shoulder surgery    Married   Special educ   HH of 4   3 outside dogs, 8 goats, 40 chickens and quail. [place]   Job stresses   Ex husband passes away   Not working after injur at school  Shoulder neck                Social Drivers of Health   Financial Resource Strain: Low Risk  (10/29/2022)   Overall Financial Resource Strain (CARDIA)    Difficulty of Paying Living Expenses: Not hard at all  Food Insecurity: No Food Insecurity (10/29/2022)   Hunger Vital Sign    Worried About Running Out of Food in the Last Year: Never true    Ran Out of Food in the Last Year: Never true   Transportation Needs: No Transportation Needs (10/29/2022)   PRAPARE - Administrator, Civil Service (Medical): No    Lack of Transportation (Non-Medical): No  Physical Activity: Insufficiently Active (10/29/2022)   Exercise Vital Sign    Days of Exercise per Week: 2 days    Minutes of Exercise per Session: 20 min  Stress: No Stress Concern Present (10/29/2022)   [employer] of Occupational Health - Occupational Stress Questionnaire    Feeling of Stress : Not at all  Social Connections: Socially Integrated (10/29/2022)   Social Connection and Isolation Panel [NHANES]    Frequency of Communication with Friends and Family: More than three times a week    Frequency of Social Gatherings with Friends and Family: More than three times a week    Attends Religious Services: More than 4 times per year    Active Member of [employer] or Organizations: Yes    Attends Engineer, structural: More than 4 times per year    Marital Status: Married  Catering manager Violence: Not At Risk (10/29/2022)   Humiliation, Afraid, Rape, and Kick questionnaire    Fear of Current or Ex-Partner: No    Emotionally Abused: No    Physically Abused: No    Sexually Abused: No    Review of Systems:    Constitutional: No weight loss, fever or chills Skin: No  rash  Cardiovascular: No chest pain Respiratory: No SOB  Gastrointestinal: See HPI and otherwise negative Genitourinary: No dysuria  Neurological: No headache, dizziness or syncope Musculoskeletal: No new muscle or joint pain Hematologic: No bleeding  Psychiatric: No history of depression or anxiety   Physical Exam:  Vital signs: BP (!) 150/80   Pulse 95   Ht 5\' 5"  (1.651 m)   Wt 220 lb (99.8 kg)   LMP  (LMP Unknown)   BMI 36.61 kg/m    Constitutional:   Pleasant overweight Caucasian female appears to be in NAD, Well developed, Well nourished, alert and cooperative Head:  Normocephalic and atraumatic. Eyes:   PEERL, EOMI. No  icterus. Conjunctiva pink. Ears:  Normal auditory acuity. Neck:  Supple Throat: Oral cavity and pharynx without inflammation, swelling or lesion.  Respiratory: Respirations even and unlabored. Lungs clear to auscultation bilaterally.   No wheezes, crackles, or rhonchi.  Cardiovascular: Normal S1, S2. No MRG. Regular rate and rhythm. No peripheral edema, cyanosis or pallor.  Gastrointestinal:  Soft, nondistended, nontender. No rebound or guarding. Normal bowel sounds. No appreciable masses or hepatomegaly. Rectal:  Not performed.  Msk:  Symmetrical without gross deformities. Without edema, no deformity or joint abnormality.  Neurologic:  Alert and  oriented x4;  grossly normal neurologically.  Skin:   Dry and intact without significant lesions or rashes. Psychiatric: Demonstrates good judgement and reason without abnormal affect or behaviors.  RELEVANT LABS AND IMAGING: CBC    Component Value Date/Time   WBC 6.8 06/19/2023 1149   RBC 4.67 06/19/2023 1149   HGB 14.0 06/19/2023 1149   HGB 13.6 03/31/2023 0856   HCT 42.2 06/19/2023 1149   PLT 216 06/19/2023 1149   PLT 219 03/31/2023 0856   MCV 90.4 06/19/2023 1149   MCH 30.0 06/19/2023 1149   MCHC 33.2 06/19/2023 1149   RDW 13.9 06/19/2023 1149   LYMPHSABS 1.8 03/31/2023 0856   MONOABS 0.5 03/31/2023 0856   EOSABS 0.1 03/31/2023 0856   BASOSABS 0.1 03/31/2023 0856    CMP     Component Value Date/Time   NA 139 06/19/2023 1149   NA 141 01/09/2020 0000   K 3.9 06/19/2023 1149   CL 103 06/19/2023 1149   CO2 28 06/19/2023 1149   GLUCOSE 93 06/19/2023 1149   BUN 18 06/19/2023 1149   BUN 13 06/05/2020 0000   CREATININE 0.65 06/19/2023 1149   CREATININE 0.77 03/31/2023 0856   CREATININE 0.86 02/06/2011 0937   CALCIUM 9.4 06/19/2023 1149   PROT 7.2 06/19/2023 1149   ALBUMIN 4.1 06/19/2023 1149   AST 24 06/19/2023 1149   AST 23 03/31/2023 0856   ALT 29 06/19/2023 1149   ALT 26 03/31/2023 0856   ALKPHOS 104 06/19/2023 1149    BILITOT 0.7 06/19/2023 1149   BILITOT 0.8 03/31/2023 0856   GFRNONAA >60 06/19/2023 1149   GFRNONAA >60 03/31/2023 0856   GFRNONAA >60 02/06/2011 0937   GFRAA 95 01/09/2020 0000   GFRAA >60 02/06/2011 0937    Assessment: 1.  Left upper quadrant pain: Starts within seconds after eating spicy food/acidic food, patient is avoiding these things and symptoms are 90% better but she is worried about something going on in there, very worried about PPI side effects and would like to wait to start this medicine; consider gastritis most likely +/- PUD +/- H. pylori 2.  Fatty liver: Found at time of recent imaging 3.  Gallbladder polyp: 7 mm on recent ultrasound, repeat ultrasound scheduled for 6  months out  Plan: 1.  Scheduled patient for diagnostic EGD with Dr. Myrtie Neither.  Did provide the patient a detailed list of risks for the procedure and she agrees to proceed. Patient is appropriate for endoscopic procedure(s) in the ambulatory ([facility]) setting.  2. Did discuss that if there is inflammation seen she will likely be recommended to start a PPI.  We spent time discussing the side effects of this medication. 3.  Discussed fatty liver and recommendations for slow and steady weight loss and healthy diet. 4.  Discussed gallbladder polyp.  She is already in for follow-up ultrasound which other recommendations. 5.  Patient to follow in clinic per recommendations from Dr. Myrtie Neither after time of procedure.  Hyacinth Meeker, PA-C [facility] 07/23/2023, 9:59 AM  Cc: Madelin Headings, MD

## 2023-07-23 NOTE — Patient Instructions (Signed)
 You have been scheduled for an endoscopy. Please follow written instructions given to you at your visit today.  If you use inhalers (even only as needed), please bring them with you on the day of your procedure.  If you take any of the following medications, they will need to be adjusted prior to your procedure:   DO NOT TAKE 7 DAYS PRIOR TO TEST- Trulicity (dulaglutide) Ozempic, Wegovy (semaglutide) Mounjaro (tirzepatide) Bydureon Bcise (exanatide extended release)  DO NOT TAKE 1 DAY PRIOR TO YOUR TEST Rybelsus (semaglutide) Adlyxin (lixisenatide) Victoza (liraglutide) Byetta (exanatide) ___________________________________________________________________________   Thank you for trusting me with your gastrointestinal care!  Hyacinth Meeker, PA-C   _______________________________________________________  If your blood pressure at your visit was 140/90 or greater, please contact your primary care physician to follow up on this.  _______________________________________________________  If you are age 71 or older, your body mass index should be between 23-30. Your Body mass index is 36.61 kg/m. If this is out of the aforementioned range listed, please consider follow up with your Primary Care Provider.  If you are age 90 or younger, your body mass index should be between 19-25. Your Body mass index is 36.61 kg/m. If this is out of the aformentioned range listed, please consider follow up with your Primary Care Provider.   ________________________________________________________  The [facility] providers would like to encourage you to use [facility] to communicate with providers for non-urgent requests or questions.  Due to long hold times on the telephone, sending your provider a message by [facility] may be a faster and more efficient way to get a response.  Please allow 48 business hours for a response.  Please remember that this is for non-urgent requests.   _______________________________________________________

## 2023-07-29 DIAGNOSIS — M436 Torticollis: Secondary | ICD-10-CM | POA: Diagnosis not present

## 2023-07-29 DIAGNOSIS — M542 Cervicalgia: Secondary | ICD-10-CM | POA: Diagnosis not present

## 2023-07-29 DIAGNOSIS — M6281 Muscle weakness (generalized): Secondary | ICD-10-CM | POA: Diagnosis not present

## 2023-07-29 NOTE — Progress Notes (Signed)
 ____________________________________________________________  Attending physician addendum:  Thank you for sending this case to me. I have reviewed the entire note and agree with the plan.  Sounds likely benign, though EGD warranted. F/u GB polyp with imaging  Amada Jupiter, MD  ____________________________________________________________

## 2023-08-03 ENCOUNTER — Encounter: Payer: Self-pay | Admitting: Gastroenterology

## 2023-08-05 ENCOUNTER — Telehealth: Payer: Self-pay | Admitting: Physician Assistant

## 2023-08-05 NOTE — Telephone Encounter (Signed)
 Patient called stated she is schedule for a procedure on 08/11/23 for an EGD and has a loose tooth, would like to know if that is okay.

## 2023-08-05 NOTE — Telephone Encounter (Signed)
Dr. Danis, please see note below and advise. Thanks 

## 2023-08-06 NOTE — Telephone Encounter (Signed)
 I could not say for certain because it depends on which tooth and how loose it is. There is an increased risk of dental damage from the placement of the bite block or passage of the scope.  She can either come for the procedure as scheduled and let the anesthesia provider take a look and decide whether or not the procedure should be done, or she can cancel the procedure and see her dentist first to attend to the tooth.  Ellwood Dense MD

## 2023-08-07 NOTE — Telephone Encounter (Signed)
 Per [name], patient would not be charged if she needs to reschedule when she comes for EGD & is evaluated by anesthesia. Pt stated she would like to think on this further & will give [place] a call back.

## 2023-08-07 NOTE — Telephone Encounter (Signed)
 Pt made aware of MD recommendations. It is #13 tooth & has been this way for almost a year. She'd like to potentially still come for procedure & have anesthesia evaluate her then, however would like to know if she would still be charged if they need to cancel. Advised her I would be back in touch with her once I have spoken with nurse manager.

## 2023-08-10 DIAGNOSIS — F418 Other specified anxiety disorders: Secondary | ICD-10-CM | POA: Diagnosis not present

## 2023-08-11 ENCOUNTER — Ambulatory Visit (AMBULATORY_SURGERY_CENTER): Admitting: Gastroenterology

## 2023-08-11 ENCOUNTER — Encounter: Payer: Self-pay | Admitting: Gastroenterology

## 2023-08-11 VITALS — BP 158/86 | HR 67 | Temp 98.1°F | Resp 13 | Ht 65.0 in | Wt 220.0 lb

## 2023-08-11 DIAGNOSIS — M797 Fibromyalgia: Secondary | ICD-10-CM | POA: Diagnosis not present

## 2023-08-11 DIAGNOSIS — I10 Essential (primary) hypertension: Secondary | ICD-10-CM | POA: Diagnosis not present

## 2023-08-11 DIAGNOSIS — E039 Hypothyroidism, unspecified: Secondary | ICD-10-CM | POA: Diagnosis not present

## 2023-08-11 DIAGNOSIS — G4730 Sleep apnea, unspecified: Secondary | ICD-10-CM | POA: Diagnosis not present

## 2023-08-11 DIAGNOSIS — K317 Polyp of stomach and duodenum: Secondary | ICD-10-CM

## 2023-08-11 DIAGNOSIS — K319 Disease of stomach and duodenum, unspecified: Secondary | ICD-10-CM

## 2023-08-11 DIAGNOSIS — K2950 Unspecified chronic gastritis without bleeding: Secondary | ICD-10-CM | POA: Diagnosis not present

## 2023-08-11 DIAGNOSIS — R1012 Left upper quadrant pain: Secondary | ICD-10-CM | POA: Diagnosis not present

## 2023-08-11 DIAGNOSIS — F32A Depression, unspecified: Secondary | ICD-10-CM | POA: Diagnosis not present

## 2023-08-11 MED ORDER — SODIUM CHLORIDE 0.9 % IV SOLN: 500.0000 mL | Freq: Once | INTRAVENOUS | Status: DC

## 2023-08-11 NOTE — Op Note (Signed)
 [facility] Patient Name: [name] Procedure Date: 08/11/2023 9:13 AM MRN: [id number] Endoscopist: Sherilyn Cooter L. Myrtie Neither , MD, [id number] Age: 68 Referring MD:  Date of Birth: 05/11/1956 Gender: Female Account #: [id number] Procedure:                Upper GI endoscopy Indications:              Abdominal pain in the left upper quadrant and                            epigastrium                           clinical details in 07/23/23 office note                           pain brief and burning quality, sometimes                            postprandial, sometimes positional                           [place] with fatty liver and GB polyp, CTAP unrevealing Medicines:                Monitored Anesthesia Care Procedure:                Pre-Anesthesia Assessment:                           - Prior to the procedure, a History and Physical                            was performed, and patient medications and                            allergies were reviewed. The patient's tolerance of                            previous anesthesia was also reviewed. The risks                            and benefits of the procedure and the sedation                            options and risks were discussed with the patient.                            All questions were answered, and informed consent                            was obtained. Prior Anticoagulants: The patient has                            taken no anticoagulant or antiplatelet agents. ASA  Grade Assessment: III - A patient with severe                            systemic disease. After reviewing the risks and                            benefits, the patient was deemed in satisfactory                            condition to undergo the procedure.                           After obtaining informed consent, the endoscope was                            passed under direct vision. Throughout the                             procedure, the patient's blood pressure, pulse, and                            oxygen saturations were monitored continuously. The                            Olympus Scope [id number] was introduced through the                            mouth, and advanced to the second part of duodenum.                            The upper GI endoscopy was accomplished without                            difficulty. The patient tolerated the procedure                            well. Scope In: Scope Out: Findings:                 The esophagus was normal.                           Striped mildly erythematous mucosa without bleeding                            was found in the prepyloric region of the stomach.                            Several biopsies were obtained in the gastric body                            and in the gastric antrum with cold forceps for                            histology. (one  pathology jar - r/o H pylori)                           Multiple small sessile fundic gland polyps were                            found in the gastric fundus and in the gastric                            body. (examined under [facility] and [facility])                           The exam of the stomach was otherwise normal.                           The cardia and gastric fundus were normal on                            retroflexion.                           The examined duodenum was normal. Complications:            No immediate complications. Estimated Blood Loss:     Estimated blood loss was minimal. Impression:               - Normal esophagus.                           - Erythematous mucosa in the prepyloric region of                            the stomach.                           - Multiple fundic gland polyps.                           - Normal examined duodenum.                           - Several biopsies were obtained in the gastric                            body and in the gastric antrum.                            Symptoms seem benign, somewhat dyspeptic. Recommendation:           - Patient has a contact number available for                            emergencies. The signs and symptoms of potential                            delayed complications were discussed with the  patient. Return to normal activities tomorrow.                            Written discharge instructions were provided to the                            patient.                           - Resume previous diet.                           - Continue present medications. No current                            indication for chronic acid suppression medicine.                           - Await pathology results. Gianni Mihalik L. Myrtie Neither, MD 08/11/2023 9:43:27 AM This report has been signed electronically.

## 2023-08-11 NOTE — Patient Instructions (Signed)
Discharge instructions given. Biopsies taken. Resume previous medications. YOU HAD AN ENDOSCOPIC PROCEDURE TODAY AT THE  ENDOSCOPY CENTER:   Refer to the procedure report that was given to you for any specific questions about what was found during the examination.  If the procedure report does not answer your questions, please call your gastroenterologist to clarify.  If you requested that your care partner not be given the details of your procedure findings, then the procedure report has been included in a sealed envelope for you to review at your convenience later.  YOU SHOULD EXPECT: Some feelings of bloating in the abdomen. Passage of more gas than usual.  Walking can help get rid of the air that was put into your GI tract during the procedure and reduce the bloating. If you had a lower endoscopy (such as a colonoscopy or flexible sigmoidoscopy) you may notice spotting of blood in your stool or on the toilet paper. If you underwent a bowel prep for your procedure, you may not have a normal bowel movement for a few days.  Please Note:  You might notice some irritation and congestion in your nose or some drainage.  This is from the oxygen used during your procedure.  There is no need for concern and it should clear up in a day or so.  SYMPTOMS TO REPORT IMMEDIATELY:   Following upper endoscopy (EGD)  Vomiting of blood or coffee ground material  New chest pain or pain under the shoulder blades  Painful or persistently difficult swallowing  New shortness of breath  Fever of 100F or higher  Black, tarry-looking stools  For urgent or emergent issues, a gastroenterologist can be reached at any hour by calling [phone]. Do not use MyChart messaging for urgent concerns.    DIET:  We do recommend a small meal at first, but then you may proceed to your regular diet.  Drink plenty of fluids but you should avoid alcoholic beverages for 24 hours.  ACTIVITY:  You should plan to take it  easy for the rest of today and you should NOT DRIVE or use heavy machinery until tomorrow (because of the sedation medicines used during the test).    FOLLOW UP: Our staff will call the number listed on your records the next business day following your procedure.  We will call around 7:15- 8:00 am to check on you and address any questions or concerns that you may have regarding the information given to you following your procedure. If we do not reach you, we will leave a message.     If any biopsies were taken you will be contacted by phone or by letter within the next 1-3 weeks.  Please call us at [phone] if you have not heard about the biopsies in 3 weeks.    SIGNATURES/CONFIDENTIALITY: You and/or your care partner have signed paperwork which will be entered into your electronic medical record.  These signatures attest to the fact that that the information above on your After Visit Summary has been reviewed and is understood.  Full responsibility of the confidentiality of this discharge information lies with you and/or your care-partner. 

## 2023-08-11 NOTE — Progress Notes (Signed)
 Called to room to assist during endoscopic procedure.  Patient ID and intended procedure confirmed with present staff. Received instructions for my participation in the procedure from the performing physician.

## 2023-08-11 NOTE — Progress Notes (Signed)
 History and Physical:  This patient presents for endoscopic testing for: Encounter Diagnosis  Name Primary?   LUQ pain Yes    No significant changes to clinical history since GI office visit on 07/23/23. Sharp upper pain resolved after stopping a supplement, and the intermittent burning epigastric pain is often felt in certain positions. The patient is appropriate for an endoscopic procedure in the ambulatory setting.   Patient is otherwise without complaints or active issues today.   Past Medical History: Past Medical History:  Diagnosis Date   Anal fissure    Atrial septal aneurysm / if pfo  echo 6 13  10/23/2011   Breast cancer (HCC)    Cataract    both eyes   Depression    Fatty liver    "pre fatty liver"   Fibromyalgia    Headache(784.0)    hx of migraines when younger   Heart palpitations    hx with normal holter event monitoring   Hx: UTI (urinary tract infection)    Hyperlipidemia    Hypertension    Hypothyroidism    Obesity    Personal history of chemotherapy    Personal history of radiation therapy    Pneumonia 1972   hx of   Polyp of colon    Serrated adenoma of colon 08/2012   Skin cancer    basal, squamous cell   Sleep apnea    uses cpap     Past Surgical History: Past Surgical History:  Procedure Laterality Date   BREAST LUMPECTOMY     BREAST LUMPECTOMY WITH RADIOACTIVE SEED AND SENTINEL LYMPH NODE BIOPSY Right 07/26/2020   Procedure: RIGHT BREAST LUMPECTOMY WITH RADIOACTIVE SEED AND SENTINEL LYMPH NODE BIOPSY;  Surgeon: Griselda Miner, MD;  Location: [facility] OR;  Service: General;  Laterality: Right;   BREAST LUMPECTOMY WITH RADIOACTIVE SEED LOCALIZATION Left 07/26/2020   Procedure: LEFT BREAST LUMPECTOMY X 2  WITH RADIOACTIVE SEED LOCALIZATION;  Surgeon: Griselda Miner, MD;  Location: [facility] OR;  Service: General;  Laterality: Left;   COLONOSCOPY     DILATION AND CURETTAGE OF UTERUS  1978   ECTOPIC PREGNANCY SURGERY  1983   KNEE ARTHROSCOPY     both  in past   POLYPECTOMY     SHOULDER OPEN ROTATOR CUFF REPAIR Left 07/07/2013   Procedure: LEFT SHOULDER MINI OPEN SUBACROMIAL DECOMPRESSION ROTATOR CUFF REPAIR AND POSSIBLE PATCH GRAFT ;  Surgeon: Javier Docker, MD;  Location: [facility];  Service: Orthopedics;  Laterality: Left;  with interscaline block   SKIN CANCER EXCISION Bilateral    arm, legs, and chest   TONSILLECTOMY      Allergies: Allergies  Allergen Reactions   Penicillins Itching, Swelling and Other (See Comments)    REACTION: swelling up as an adult-68 yrs old REACTION: swelling up as a child   Taxotere [Docetaxel] Anaphylaxis    08/30/20 Constricted throat sensation of throat and tongue swelling and chest pressure Docetaxel paused and given Pepcid 20 mg IV, Benadryl 25 mg IV, Ativan 0.5 mg IV x1.  Ativan 0.5 mg IV x1 was repeated after the restart of the patient's chemotherapy. The patient's symptoms abated and docetaxel restarted.  Then had a recurrence of constriction of throat and was given Ativan 0.5 mg IV x1 for total dose of 1 mg of Ativan.  She was able to complete her treatment with out any further issues or concerns.   Cefdinir Swelling, Itching and Rash    REACTION: Rash, swelling and itching   Cetirizine  Other (See Comments)    unsure   Femara [Letrozole] Other (See Comments)    Pt reports severe muscle cramp and exhaustion.    Pegfilgrastim Other (See Comments)    Bad reaction- unspecified    Irbesartan Other (See Comments)    See 1 20 note  Heart burn , dizziness when taken with diuretic  See 1 20 note  Heart burn , dizziness when taken with diuretic    Molds & Smuts Other (See Comments)    Mold and mildew   Tylenol [Acetaminophen] Other (See Comments)    Pre fatty liver: does not takes these    Outpatient Meds: Current Outpatient Medications  Medication Sig Dispense Refill   amLODipine (NORVASC) 10 MG tablet Take 10 mg by mouth daily.     Ascorbic Acid (VITAMIN C) 1000 MG tablet Take 1,000 mg by mouth  daily.     b complex vitamins capsule Take 1 capsule by mouth daily.     Cholecalciferol (VITAMIN D) 50 MCG (2000 UT) tablet Take 4,000 Units by mouth daily.     Menaquinone-7 (VITAMIN K2) 100 MCG CAPS Take 100 mcg by mouth daily.     Multiple Vitamin (MULTIVITAMIN) tablet Take 1 tablet by mouth daily.     OVER THE COUNTER MEDICATION Take 2 capsules by mouth in the morning and at bedtime. Infla Med     OVER THE COUNTER MEDICATION Take 1 capsule by mouth daily. Essential Pro     potassium chloride SA (KLOR-CON M) 20 MEQ tablet TAKE ONE TABLET BY MOUTH ONCE A DAY (Patient taking differently: Take 20 mEq by mouth daily. Taking 1/2) 90 tablet 0   Probiotic Product (PROBIOTIC PO) Take 1 capsule by mouth daily.     thyroid (ARMOUR THYROID) 90 MG tablet Take 90 mg by mouth daily. 90 mg Saturday and Sunday and 45 mg Monday through Friday     ALPRAZolam (XANAX) 0.5 MG tablet Take 0.5 mg by mouth at bedtime as needed. (Patient not taking: Reported on 08/11/2023)     Bioflavonoid Products (QUERCETIN COMPLEX IMMUNE PO) Take 1 capsule by mouth as needed.     melatonin 5 MG TABS Take 5 mg by mouth at bedtime.     pantoprazole (PROTONIX) 40 MG tablet Take 1 tablet (40 mg total) by mouth daily. (Patient not taking: Reported on 07/23/2023) 30 tablet 0   triamcinolone cream (KENALOG) 0.1 % Apply 1 Application topically. (Patient not taking: Reported on 08/11/2023)     UBIQUINOL PO Take by mouth.     ZINC GLUCONATE PO Take 10 mg by mouth daily.     Current Facility-Administered Medications  Medication Dose Route Frequency Provider Last Rate Last Admin   0.9 %  sodium chloride infusion  500 mL Intravenous Once Sherrilyn Rist, MD          ___________________________________________________________________ Objective   Exam:  BP (!) 161/57   Pulse 81   Temp 98.1 F (36.7 C)   Ht 5\' 5"  (1.651 m)   Wt 220 lb (99.8 kg)   LMP  (LMP Unknown)   SpO2 99%   BMI 36.61 kg/m   CV: regular , S1/S2 Resp: clear  to auscultation bilaterally, normal RR and effort noted GI: soft, no tenderness, with active bowel sounds. Mildly loose tooth top left, away from bite block  Assessment: Encounter Diagnosis  Name Primary?   LUQ pain Yes     Plan:  EGD  The benefits and risks of the planned procedure(s) were  described in detail with the patient or (when appropriate) their health care proxy.  Risks were outlined as including, but not limited to, bleeding, infection, perforation, adverse medication reaction leading to cardiac or pulmonary decompensation, pancreatitis (if ERCP).  The limitation of incomplete mucosal visualization was also discussed.  No guarantees or warranties were given.  The patient is appropriate for an endoscopic procedure in the ambulatory setting.   - Amada Jupiter, MD

## 2023-08-11 NOTE — Progress Notes (Signed)
 Pt's states no medical or surgical changes since previsit or office visit.

## 2023-08-12 ENCOUNTER — Telehealth: Payer: Self-pay

## 2023-08-12 DIAGNOSIS — M542 Cervicalgia: Secondary | ICD-10-CM | POA: Diagnosis not present

## 2023-08-12 DIAGNOSIS — M6281 Muscle weakness (generalized): Secondary | ICD-10-CM | POA: Diagnosis not present

## 2023-08-12 DIAGNOSIS — M436 Torticollis: Secondary | ICD-10-CM | POA: Diagnosis not present

## 2023-08-12 NOTE — Telephone Encounter (Signed)
  Follow up Call-     08/11/2023    8:53 AM 03/10/2022    9:57 AM  Call back number  Post procedure Call Back phone  # [phone]  Permission to leave phone message Yes Yes     Patient questions:  Do you have a fever, pain , or abdominal swelling? No. Pain Score  0 *  Have you tolerated food without any problems? Yes.    Have you been able to return to your normal activities? Yes.    Do you have any questions about your discharge instructions: Diet   No. Medications  No. Follow up visit  No.  Do you have questions or concerns about your Care? No.  Actions: * If pain score is 4 or above: No action needed, pain <4.

## 2023-08-13 ENCOUNTER — Encounter: Payer: Self-pay | Admitting: Gastroenterology

## 2023-08-13 LAB — SURGICAL PATHOLOGY

## 2023-08-14 ENCOUNTER — Telehealth: Payer: Self-pay | Admitting: Gastroenterology

## 2023-08-14 NOTE — Telephone Encounter (Signed)
 The pt states that after her EGD she began to have some swelling in the inside of her lip.  No pain, redness or other complaints.  She is going to apply ice and take tylenol if needed. She will call back if it does not improve. She did also mention that she has gotten cold sores in the past and it seems to be similar.  She says she has heard about amino acids for cold sores.  I advised that she will reach out to PCP in regards to the cold sore.  I will send to Dr Meridee Score as an Lorain Childes.

## 2023-08-14 NOTE — Telephone Encounter (Signed)
 PT had an EGD 4/1 and has noticed that her lip is swollen. She is very concerned. Please advise.

## 2023-08-16 NOTE — Telephone Encounter (Signed)
 Patty, Agree with your plan of action.  FYI HD on your patient.

## 2023-08-17 NOTE — Telephone Encounter (Signed)
 Thank you for the note, and I see Dr. Elesa Hacker note as well.  Most likely from bite block pressure on the lip during the endoscopy.  Ice packs, Tylenol, ibuprofen. Expected to resolve with that treatment and time.  Ellwood Dense MD

## 2023-08-19 ENCOUNTER — Telehealth: Payer: Self-pay | Admitting: Gastroenterology

## 2023-08-19 DIAGNOSIS — M436 Torticollis: Secondary | ICD-10-CM | POA: Diagnosis not present

## 2023-08-19 DIAGNOSIS — M542 Cervicalgia: Secondary | ICD-10-CM | POA: Diagnosis not present

## 2023-08-19 DIAGNOSIS — M6281 Muscle weakness (generalized): Secondary | ICD-10-CM | POA: Diagnosis not present

## 2023-08-19 NOTE — Telephone Encounter (Signed)
 Inbound call from patient stating that she had a EGD on 4/1 and is requesting a call back to discuss results. Please advise.

## 2023-08-19 NOTE — Telephone Encounter (Signed)
 The pt has been advised of the results of the pathology from EGD. I did read the letter to her and also made her aware that the letter is in My Chart and mailed to the home address.   Nothing further needed at this time

## 2023-08-20 DIAGNOSIS — F418 Other specified anxiety disorders: Secondary | ICD-10-CM | POA: Diagnosis not present

## 2023-08-26 DIAGNOSIS — M6281 Muscle weakness (generalized): Secondary | ICD-10-CM | POA: Diagnosis not present

## 2023-08-26 DIAGNOSIS — M436 Torticollis: Secondary | ICD-10-CM | POA: Diagnosis not present

## 2023-08-26 DIAGNOSIS — M542 Cervicalgia: Secondary | ICD-10-CM | POA: Diagnosis not present

## 2023-08-26 DIAGNOSIS — L821 Other seborrheic keratosis: Secondary | ICD-10-CM | POA: Diagnosis not present

## 2023-08-26 DIAGNOSIS — C44722 Squamous cell carcinoma of skin of right lower limb, including hip: Secondary | ICD-10-CM | POA: Diagnosis not present

## 2023-08-26 DIAGNOSIS — D485 Neoplasm of uncertain behavior of skin: Secondary | ICD-10-CM | POA: Diagnosis not present

## 2023-08-31 DIAGNOSIS — M47812 Spondylosis without myelopathy or radiculopathy, cervical region: Secondary | ICD-10-CM | POA: Diagnosis not present

## 2023-08-31 DIAGNOSIS — G894 Chronic pain syndrome: Secondary | ICD-10-CM | POA: Diagnosis not present

## 2023-08-31 DIAGNOSIS — Z79899 Other long term (current) drug therapy: Secondary | ICD-10-CM | POA: Diagnosis not present

## 2023-09-01 ENCOUNTER — Telehealth: Payer: Self-pay | Admitting: Gastroenterology

## 2023-09-01 NOTE — Telephone Encounter (Signed)
 The pt states that she continues to have "burning" in the stomach.  We discussed her symptoms and her medications.  She is not taking pantoprazole  as prescribed. She will begin taking that and call back with an update on her symptoms.

## 2023-09-01 NOTE — Telephone Encounter (Signed)
 Patient called and stated that she is currently having question regarding her EGD pathology report and would like to speak to some regarding those results. Patient is requesting a to speak to the nurse. Please advise.

## 2023-09-02 DIAGNOSIS — R790 Abnormal level of blood mineral: Secondary | ICD-10-CM | POA: Diagnosis not present

## 2023-09-02 DIAGNOSIS — R5383 Other fatigue: Secondary | ICD-10-CM | POA: Diagnosis not present

## 2023-09-02 DIAGNOSIS — E782 Mixed hyperlipidemia: Secondary | ICD-10-CM | POA: Diagnosis not present

## 2023-09-02 DIAGNOSIS — E559 Vitamin D deficiency, unspecified: Secondary | ICD-10-CM | POA: Diagnosis not present

## 2023-09-02 DIAGNOSIS — R7989 Other specified abnormal findings of blood chemistry: Secondary | ICD-10-CM | POA: Diagnosis not present

## 2023-09-02 DIAGNOSIS — E039 Hypothyroidism, unspecified: Secondary | ICD-10-CM | POA: Diagnosis not present

## 2023-09-02 DIAGNOSIS — N959 Unspecified menopausal and perimenopausal disorder: Secondary | ICD-10-CM | POA: Diagnosis not present

## 2023-09-02 DIAGNOSIS — K760 Fatty (change of) liver, not elsewhere classified: Secondary | ICD-10-CM | POA: Diagnosis not present

## 2023-09-02 DIAGNOSIS — N951 Menopausal and female climacteric states: Secondary | ICD-10-CM | POA: Diagnosis not present

## 2023-09-02 DIAGNOSIS — E063 Autoimmune thyroiditis: Secondary | ICD-10-CM | POA: Diagnosis not present

## 2023-09-02 DIAGNOSIS — R7309 Other abnormal glucose: Secondary | ICD-10-CM | POA: Diagnosis not present

## 2023-09-03 DIAGNOSIS — F418 Other specified anxiety disorders: Secondary | ICD-10-CM | POA: Diagnosis not present

## 2023-09-05 ENCOUNTER — Other Ambulatory Visit: Payer: Self-pay | Admitting: Family

## 2023-09-07 DIAGNOSIS — G4733 Obstructive sleep apnea (adult) (pediatric): Secondary | ICD-10-CM | POA: Diagnosis not present

## 2023-09-09 DIAGNOSIS — C44722 Squamous cell carcinoma of skin of right lower limb, including hip: Secondary | ICD-10-CM | POA: Diagnosis not present

## 2023-09-15 DIAGNOSIS — M542 Cervicalgia: Secondary | ICD-10-CM | POA: Diagnosis not present

## 2023-09-15 DIAGNOSIS — M436 Torticollis: Secondary | ICD-10-CM | POA: Diagnosis not present

## 2023-09-15 DIAGNOSIS — M6281 Muscle weakness (generalized): Secondary | ICD-10-CM | POA: Diagnosis not present

## 2023-09-17 DIAGNOSIS — F418 Other specified anxiety disorders: Secondary | ICD-10-CM | POA: Diagnosis not present

## 2023-09-17 DIAGNOSIS — M160 Bilateral primary osteoarthritis of hip: Secondary | ICD-10-CM | POA: Diagnosis not present

## 2023-09-21 ENCOUNTER — Other Ambulatory Visit: Payer: Self-pay

## 2023-09-21 DIAGNOSIS — Z170 Estrogen receptor positive status [ER+]: Secondary | ICD-10-CM

## 2023-09-21 NOTE — Progress Notes (Unsigned)
 [facility]     [phone] [phone]    Patient Care Team: Panosh, Joaquim Muir, MD as PCP - General Merryl Abraham, MD as Consulting Physician (Obstetrics and Gynecology) Dr Melvern Stack, MD as Consulting Physician (Orthopedic Surgery) Asencion Blacksmith, MD (Inactive) as Consulting Physician (Gastroenterology) Mort Ards, MD as Consulting Physician (Orthopedic Surgery) Alane Hsu, RN as Oncology Nurse Navigator Auther Bo, RN as Oncology Nurse Navigator Caralyn Chandler, MD as Consulting Physician (General Surgery) Sonja Purdy, MD as Consulting Physician (Hematology) Colie Dawes, MD as Attending Physician (Radiation Oncology) Thais Fill, MD as Consulting Physician (Dermatology) Portia Brittle, MD as Referring Physician (Cardiology) Rolin Clifton, NP as Nurse Practitioner (Nurse Practitioner) Carnell Christian, [facility] (Pharmacist)   CHIEF COMPLAINT: 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  Oncology History Overview Note  Cancer Staging Ductal carcinoma in situ (DCIS) of left breast Staging form: Breast, AJCC 8th Edition - Clinical stage from 06/21/2020: Stage 0 (cTis (DCIS), cN0, cM0, G2, ER+, PR+, HER2: Not Assessed) - Signed by Sonja Manvel, MD on 06/26/2020 Stage prefix: Initial diagnosis  Malignant neoplasm of upper-outer quadrant of right breast in female, estrogen receptor positive (HCC) Staging form: Breast, AJCC 8th Edition - Clinical stage from 06/21/2020: Stage IA (cT1b, cN0, cM0, G2, ER+, PR+, HER2-) - Signed by Sonja Newport, MD on 06/26/2020 Stage prefix: Initial diagnosis - Pathologic stage from 07/26/2020: Stage IA (pT1c, pN1a, cM0, G2, ER+, PR+, HER2-) - Signed by Sonja [place], MD on 08/09/2020 Stage prefix: Initial diagnosis Nuclear grade: G2 Histologic grading system: 3 grade system Residual tumor (R): R0 - None    Malignant neoplasm of upper-outer quadrant of right breast in female, estrogen receptor  positive (HCC)  06/08/2020 Mammogram   IMPRESSION: 1. 9 x 7 x 6 mm mass in the 12 o'clock position of the right breast, 2cmfn with imaging features highly suspicious for malignancy. 2. 4 mm group of indeterminate calcifications in the 12 o'clock position of the left breast and 4 mm group of indeterminate calcifications in the 1 o'clock position of the left breast. Together, the groups span an area measuring 3.9 cm.   06/21/2020 Cancer Staging   Staging form: Breast, AJCC 8th Edition - Clinical stage from 06/21/2020: Stage IA (cT1b, cN0, cM0, G2, ER+, PR+, HER2-) - Signed by Sonja [place], MD on 06/26/2020 Stage prefix: Initial diagnosis   06/21/2020 Initial Biopsy   Diagnosis 1. Breast, right, needle core biopsy, 12 oc - INVASIVE MAMMARY CARCINOMA - MAMMARY CARCINOMA IN SITU - SEE COMMENT 2. Breast, left, needle core biopsy, 12 oc - MAMMARY CARCINOMA IN-SITU WITH NECROSIS AND CALCIFICATIONS - SEE COMMENT 3. Breast, left, needle core biopsy, 1 oc - MAMMARY CARCINOMA IN-SITU WITH NECROSIS AND CALCIFICATIONS - SEE COMMENT Microscopic Comment 1. The biopsy material shows an infiltrative proliferation of cells with large vesicular nuclei with inconspicuous nucleoli, arranged linearly and in small clusters. Based on the biopsy, the carcinoma appears Nottingham grade 2 of 3.  Addendum: 1. E-cadherin is POSITIVE supporting a ductal origin. 2. E-cadherin is POSITIVE supporting a ductal origin.  3. E-cadherin is positive supporting a ductal origin. The focus is less pronounced on the deeper sections and in isolation would likely be considered atypical ductal hyperplasia.   06/21/2020 Receptors her2   1. PROGNOSTIC INDICATORS Results: IMMUNOHISTOCHEMICAL AND MORPHOMETRIC ANALYSIS PERFORMED MANUALLY The tumor cells are EQUIVOCAL for Her2 (2+). HER2 by FISH will be performed and  the results reported separately Estrogen Receptor: 95%, POSITIVE, STRONG STAINING INTENSITY Progesterone  Receptor: 40%,  POSITIVE, MODERATE STAINING INTENSITY Proliferation Marker Ki67: 10%  1. FLUORESCENCE IN-SITU HYBRIDIZATION Results: GROUP 5: HER2 **NEGATIVE** Equivocal form of amplification of the HER2 gene was detected in the IHC 2+ tissue sample received from this individual. HER2 FISH was performed by a technologist and cell imaging and analysis on the [employer].   06/25/2020 Initial Diagnosis   Malignant neoplasm of upper-outer quadrant of right breast in female, estrogen receptor positive (HCC)   07/03/2020 Breast MRI   IMPRESSION: 1. Known RIGHT breast cancer, 12 o'clock axis, at anterior depth, measuring 1 cm greatest extent, manifesting as a spiculated enhancing mass on MRI, with associated biopsy clip. Expected post biopsy changes are seen within the adjacent outer RIGHT breast. 2. No evidence of additional multifocal or multicentric disease within the RIGHT breast. 3. Known LEFT breast DCIS within the slightly outer LEFT breast, at anterior depth, corresponding to the biopsy site labeled 1 o'clock axis, with associated enhancement only at the margins of the biopsy cavity measuring up to 5 mm greatest dimension. 4. Known LEFT breast DCIS within the upper central LEFT breast, at middle depth, corresponding to the biopsy site labeled 12 o'clock axis. Contiguous linear non-mass enhancement extends 2.3 cm superior-medial to the biopsy cavity, most likely post biopsy change but possibly contiguous extent of disease. 5. No evidence of additional multifocal or multicentric disease within the LEFT breast. 6. No evidence of metastatic lymphadenopathy.     07/26/2020 Cancer Staging   Staging form: Breast, AJCC 8th Edition - Pathologic stage from 07/26/2020: Stage IA (pT1c, pN1a, cM0, G2, ER+, PR+, HER2-) - Signed by Sonja Ridgely, MD on 08/09/2020 Stage prefix: Initial diagnosis Nuclear grade: G2 Histologic grading system: 3 grade system Residual tumor (R): R0 - None   07/26/2020 Surgery   RIGHT BREAST  LUMPECTOMY WITH RADIOACTIVE SEED AND SENTINEL LYMPH NODE BIOPSY by Dr Alethea Andes   07/26/2020 Pathology Results   FINAL MICROSCOPIC DIAGNOSIS:   A. LYMPH NODE, RIGHT AXILLARY #1, SENTINEL, EXCISION:  - Lymph node, negative for carcinoma (0/1)   B. LYMPH NODE, RIGHT AXILLARY, SENTINEL, EXCISION:  - Benign fibroadipose tissue, negative for carcinoma   C. BREAST, RIGHT, LUMPECTOMY:  - Invasive ductal carcinoma, 1.5 cm, grade 2  - Ductal carcinoma in situ, low grade  - Resection margins are negative for carcinoma; closest is the anterior margin of 0.2 cm  - Biopsy site changes  - See oncology table   D. BREAST, LEFT, LUMPECTOMY:  - Benign breast parenchyma with prominent biopsy-related changes  - Negative for residual ductal carcinoma in situ  - See oncology table   E. LYMPH NODE, RIGHT AXILLARY #2, SENTINEL, EXCISION:  - Invasive ductal carcinoma, see comment  COMMENT:  E.  Lymph node tissue is not identified.  Findings likely represent an entirely replaced lymph node with foci of extranodal extension.    07/26/2020 Miscellaneous   Mammaprint High Risk of Luminla Type B 29% risk of recurrence in 10 years if untreated.  Her Mammaprint index is -0.175 She has 94.6% benefit of chemotherapy and hormaonal therapy.      08/20/2020 Imaging   CT C/A/P IMPRESSION: 1. No definitive imaging findings to suggest metastatic disease in the chest, abdomen or pelvis. 2. Postoperative changes of bilateral lumpectomy and right axillary lymph node dissection with what appears to be a large postoperative seroma in the right axilla, as detailed above. Attention on follow-up studies is recommended to ensure the  stability or regression of this collection. 3. Additional incidental findings, as above.   08/28/2020 Imaging   Bone Scan IMPRESSION: No definite scintigraphic evidence of osseous metastases.   08/30/2020 - 11/02/2020 Chemotherapy   Adjuvant Docetaxel  and Cytoxan  q3weeks for 4 cycles starting  08/30/20. Given skin rash, changes taxol  to Abraxane  starting with C2 (09/21/20). Completed 11/02/20.    12/03/2020 - 01/15/2021 Radiation Therapy   Bilateral breast radiation and right regional lymph nodes   02/2021 -  Anti-estrogen oral therapy   Adjuvant exemestane    04/11/2021 Survivorship   SCP delivered by Tru Rana, NP   Ductal carcinoma in situ (DCIS) of left breast  06/08/2020 Mammogram   IMPRESSION: 1. 9 x 7 x 6 mm mass in the 12 o'clock position of the right breast, 2cmfn with imaging features highly suspicious for malignancy. 2. 4 mm group of indeterminate calcifications in the 12 o'clock position of the left breast and 4 mm group of indeterminate calcifications in the 1 o'clock position of the left breast. Together, the groups span an area measuring 3.9 cm.   06/21/2020 Cancer Staging   Staging form: Breast, AJCC 8th Edition - Clinical stage from 06/21/2020: Stage 0 (cTis (DCIS), cN0, cM0, G2, ER+, PR+, HER2: Not Assessed) - Signed by Sonja Hamilton, MD on 06/26/2020 Stage prefix: Initial diagnosis   06/21/2020 Initial Biopsy   Diagnosis 1. Breast, right, needle core biopsy, 12 oc - INVASIVE MAMMARY CARCINOMA - MAMMARY CARCINOMA IN SITU - SEE COMMENT 2. Breast, left, needle core biopsy, 12 oc - MAMMARY CARCINOMA IN-SITU WITH NECROSIS AND CALCIFICATIONS - SEE COMMENT 3. Breast, left, needle core biopsy, 1 oc - MAMMARY CARCINOMA IN-SITU WITH NECROSIS AND CALCIFICATIONS - SEE COMMENT Microscopic Comment 1. The biopsy material shows an infiltrative proliferation of cells with large vesicular nuclei with inconspicuous nucleoli, arranged linearly and in small clusters. Based on the biopsy, the carcinoma appears Nottingham grade 2 of 3.  Addendum: 1. E-cadherin is POSITIVE supporting a ductal origin. 2. E-cadherin is POSITIVE supporting a ductal origin.  3. E-cadherin is positive supporting a ductal origin. The focus is less pronounced on the deeper sections and in isolation would  likely be considered atypical ductal hyperplasia.   06/21/2020 Receptors her2   2. PROGNOSTIC INDICATORS Results: IMMUNOHISTOCHEMICAL AND MORPHOMETRIC ANALYSIS PERFORMED MANUALLY Estrogen Receptor: 95%, POSITIVE, STRONG STAINING INTENSITY Progesterone  Receptor: 30%, POSITIVE, STRONG STAINING INTENSITY   06/25/2020 Initial Diagnosis   Ductal carcinoma in situ (DCIS) of left breast   07/03/2020 Breast MRI   IMPRESSION: 1. Known RIGHT breast cancer, 12 o'clock axis, at anterior depth, measuring 1 cm greatest extent, manifesting as a spiculated enhancing mass on MRI, with associated biopsy clip. Expected post biopsy changes are seen within the adjacent outer RIGHT breast. 2. No evidence of additional multifocal or multicentric disease within the RIGHT breast. 3. Known LEFT breast DCIS within the slightly outer LEFT breast, at anterior depth, corresponding to the biopsy site labeled 1 o'clock axis, with associated enhancement only at the margins of the biopsy cavity measuring up to 5 mm greatest dimension. 4. Known LEFT breast DCIS within the upper central LEFT breast, at middle depth, corresponding to the biopsy site labeled 12 o'clock axis. Contiguous linear non-mass enhancement extends 2.3 cm superior-medial to the biopsy cavity, most likely post biopsy change but possibly contiguous extent of disease. 5. No evidence of additional multifocal or multicentric disease within the LEFT breast. 6. No evidence of metastatic lymphadenopathy.     07/26/2020 Surgery   LEFT BREAST LUMPECTOMY  X 2  WITH RADIOACTIVE SEED LOCALIZATION by Dr Alethea Andes   07/26/2020 Pathology Results   FINAL MICROSCOPIC DIAGNOSIS:   A. LYMPH NODE, RIGHT AXILLARY #1, SENTINEL, EXCISION:  - Lymph node, negative for carcinoma (0/1)   B. LYMPH NODE, RIGHT AXILLARY, SENTINEL, EXCISION:  - Benign fibroadipose tissue, negative for carcinoma   C. BREAST, RIGHT, LUMPECTOMY:  - Invasive ductal carcinoma, 1.5 cm, grade 2  - Ductal  carcinoma in situ, low grade  - Resection margins are negative for carcinoma; closest is the anterior margin of 0.2 cm  - Biopsy site changes  - See oncology table   D. BREAST, LEFT, LUMPECTOMY:  - Benign breast parenchyma with prominent biopsy-related changes  - Negative for residual ductal carcinoma in situ  - See oncology table   E. LYMPH NODE, RIGHT AXILLARY #2, SENTINEL, EXCISION:  - Invasive ductal carcinoma, see comment  COMMENT:  E.  Lymph node tissue is not identified.  Findings likely represent an entirely replaced lymph node with foci of extranodal extension.    12/03/2020 - 01/15/2021 Radiation Therapy   Bilateral breast radiation and right regional lymph nodes   02/2021 -  Anti-estrogen oral therapy   Adjuvant exemestane    04/11/2021 Survivorship   SCP delivered by Armella Stogner, NP      CURRENT THERAPY: Surveillance - Exemestane  started 02/07/2021, switch to low-dose letrozole  10/22/2021, did not tolerate full dose and stopped anti-estrogen in 01/2022   INTERVAL HISTORY Ms. [name] returns for follow up as scheduled, last seen by me 03/31/23. Doing well in the interim. 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 Denies concerning changes in her breasts such as new lump/mass, nipple discharge or inversion.   ROS  All other systems reviewed and negative  Past Medical History:  Diagnosis Date   Anal fissure    Atrial septal aneurysm / if pfo  echo 6 13  10/23/2011   Breast cancer (HCC)    Cataract    both eyes   Depression    Fatty liver    "pre fatty liver"   Fibromyalgia    Headache(784.0)    hx of migraines when younger   Heart palpitations    hx with normal holter event monitoring   Hx: UTI (urinary tract infection)    Hyperlipidemia    Hypertension    Hypothyroidism    Obesity    Personal history of chemotherapy    Personal history of radiation therapy     Pneumonia 1972   hx of   Polyp of colon    Serrated adenoma of colon 08/2012   Skin cancer    basal, squamous cell   Sleep apnea    uses cpap     Past Surgical History:  Procedure Laterality Date   BREAST LUMPECTOMY     BREAST LUMPECTOMY WITH RADIOACTIVE SEED AND SENTINEL LYMPH NODE BIOPSY Right 07/26/2020   Procedure: RIGHT BREAST LUMPECTOMY WITH RADIOACTIVE SEED AND SENTINEL LYMPH NODE BIOPSY;  Surgeon: Caralyn Chandler, MD;  Location: [facility] OR;  Service: General;  Laterality: Right;   BREAST LUMPECTOMY WITH RADIOACTIVE SEED LOCALIZATION Left 07/26/2020   Procedure: LEFT BREAST LUMPECTOMY X 2  WITH RADIOACTIVE SEED LOCALIZATION;  Surgeon: Caralyn Chandler, MD;  Location: [facility] OR;  Service: General;  Laterality: Left;   COLONOSCOPY     DILATION AND CURETTAGE OF UTERUS  1978   ECTOPIC PREGNANCY SURGERY  1983  KNEE ARTHROSCOPY     both in past   POLYPECTOMY     SHOULDER OPEN ROTATOR CUFF REPAIR Left 07/07/2013   Procedure: LEFT SHOULDER MINI OPEN SUBACROMIAL DECOMPRESSION ROTATOR CUFF REPAIR AND POSSIBLE PATCH GRAFT ;  Surgeon: Loel Ring, MD;  Location: [facility] OR[facility];  Service: Orthopedics;  Laterality: Left;  with interscaline block   SKIN CANCER EXCISION Bilateral    arm, legs, and chest   TONSILLECTOMY       Outpatient Encounter Medications as of 09/22/2023  Medication Sig Note   ALPRAZolam (XANAX) 0.5 MG tablet Take 0.5 mg by mouth at bedtime as needed. (Patient not taking: Reported on 08/11/2023)    amLODipine (NORVASC) 10 MG tablet Take 10 mg by mouth daily. 06/20/2022: Taking 1/2 tab qam and 1/2 tab qpm   Ascorbic Acid  (VITAMIN C ) 1000 MG tablet Take 1,000 mg by mouth daily.    b complex vitamins capsule Take 1 capsule by mouth daily.    Bioflavonoid Products (QUERCETIN COMPLEX IMMUNE PO) Take 1 capsule by mouth as needed.    Cholecalciferol  (VITAMIN D ) 50 MCG (2000 UT) tablet Take 4,000 Units by mouth daily.    melatonin 5 MG TABS Take 5 mg by mouth at bedtime.    Menaquinone-7  (VITAMIN K2) 100 MCG CAPS Take 100 mcg by mouth daily.    Multiple Vitamin (MULTIVITAMIN) tablet Take 1 tablet by mouth daily.    OVER THE COUNTER MEDICATION Take 2 capsules by mouth in the morning and at bedtime. Infla Med    OVER THE COUNTER MEDICATION Take 1 capsule by mouth daily. Essential Pro    pantoprazole  (PROTONIX ) 40 MG tablet Take 1 tablet (40 mg total) by mouth daily. (Patient not taking: Reported on 07/23/2023)    potassium chloride  SA (KLOR-CON  M) 20 MEQ tablet TAKE ONE TABLET BY MOUTH ONCE A DAY (Patient taking differently: Take 20 mEq by mouth daily. Taking 1/2)    Probiotic Product (PROBIOTIC PO) Take 1 capsule by mouth daily.    thyroid  (ARMOUR THYROID ) 90 MG tablet Take 90 mg by mouth daily. 90 mg Saturday and Sunday and 45 mg Monday through Friday    triamcinolone  cream (KENALOG) 0.1 % Apply 1 Application topically. (Patient not taking: Reported on 08/11/2023)    UBIQUINOL PO Take by mouth.    ZINC GLUCONATE PO Take 10 mg by mouth daily.    No facility-administered encounter medications on file as of 09/22/2023.     Today's Vitals   09/22/23 1045  BP: (!) 150/90  Pulse: 73  Resp: 17  Temp: 98 F (36.7 C)  SpO2: 99%  Weight: 216 lb 11.2 oz (98.3 kg)   Body mass index is 36.06 kg/m.   ECOG PERFORMANCE STATUS: 1 - Symptomatic but completely ambulatory  PHYSICAL EXAM GENERAL:alert, no distress and comfortable SKIN: no rash  EYES: sclera clear NECK: without mass LYMPH:  no palpable cervical or supraclavicular lymphadenopathy  LUNGS:  normal breathing effort HEART: no lower extremity edema ABDOMEN: abdomen soft, non-tender and normal bowel sounds NEURO: alert & oriented x 3 with fluent speech, no focal motor/sensory deficits Breast exam: S/p bilateral lumpectomy and radiation, incisions completely healed.  No palpable mass or nodularity in either breast or axilla that I could appreciate    CBC    Latest Ref Rng & Units 09/22/2023   10:02 AM 06/19/2023   11:49 AM  03/31/2023    8:56 AM  CBC  WBC 4.0 - 10.5 K/uL 7.1  6.8  7.0  Hemoglobin 12.0 - 15.0 g/dL 10.2  72.5  36.6   Hematocrit 36.0 - 46.0 % 40.9  42.2  41.2   Platelets 150 - 400 K/uL 234  216  219       CMP     Latest Ref Rng & Units 09/22/2023   10:02 AM 06/19/2023   11:49 AM 04/22/2023    9:13 AM  CMP  Glucose 70 - 99 mg/dL 94  93  78   BUN 8 - 23 mg/dL 16  18  21    Creatinine 0.44 - 1.00 mg/dL 4.40  3.47  4.25   Sodium 135 - 145 mmol/L 141  139  141   Potassium 3.5 - 5.1 mmol/L 3.6  3.9  3.8   Chloride 98 - 111 mmol/L 106  103  105   CO2 22 - 32 mmol/L 30  28  27    Calcium 8.9 - 10.3 mg/dL 9.3  9.4  8.8   Total Protein 6.5 - 8.1 g/dL 7.3  7.2    Total Bilirubin 0.0 - 1.2 mg/dL 0.7  0.7    Alkaline Phos 38 - 126 U/L 106  104    AST 15 - 41 U/L 24  24    ALT 0 - 44 U/L 28  29        ASSESSMENT & PLAN:Tomasina H Lebeck is a 68 y.o. female with         1. Malignant neoplasm of upper-outer quadrant of right breast, Stage IB, p(T1cN1aM0), ER+/PR+/HER2-, Grade II AND Left breast DCIS, grade II, ER+/PR+ (removed by biopsy) -diagnosed in 06/2020, s/p b/l lumpectomy and right SNLB by Dr Alethea Andes on 07/26/20.  -Mammaprint showed high risk Luminal Type B with 29% risk of distant recurrence.  -She completed adjuvant chemo with TC.  Due to allergy reaction to docetaxel , it was changed to Abraxane  from cycle 3. -She received adjuvant radiation under Dr. Lurena Sally 7/25-01/15/21. -she started exemestane  in late 01/2021. She is receiving financial assistance through [employer]" (see MyChart note from 06/11/21). -tolerated exemestane  from 03/2021 until 08/2021 with intolerable side effects including generalized bone/joint pain, breast/pectoralis pain, fatigue, brain fog, mood disturbance and elevated BP.   -She switched to low-dose letrozole  in 10/22/2021 and increased to full dose in 12/2021 but did not tolerate it after 2-3 weeks and stopped 01/2022.  Main side effects were severe cramping and toes  curling making it difficult to ambulate and poor quality of life.  SEs have improved since stopping AI. She declined to try tamoxifen  -Screening MRI 12/10/2022 and mammo 06/2023 were benign.   -Ms. Schmuhl is clinically doing well.  Exam is benign, labs are normal.  Overall no clinical concern for recurrence. -Continue surveillance, I recommend contrast-enhanced mammogram in 06/2024 -Follow-up in 6 months, or sooner if needed   2. Bone Health -she reports her last DEXA was in 03/2019, showing osteopenia.  -repeat per Dr. McComb   3. Comorbidities: Depression, Fibromyalgia, HLD, HTN, Hypothyroidism, H/o Skin cancer (squamous and basal cell), sleep apnea -She is followed for HTN. She takes her BP at home.  -On medications, continue to f/u with therapist and other physicians.    4.  Fatty liver and transaminitis -She has chronic intermittent transaminitis, attributes to fatty liver.  She is on low sugar diet, drinks a lot of water , does not drink alcohol , and avoids Tylenol  -Liver morphology was unremarkable on staging CT CAP 4/22 -Her LFTs fluctuate, all normal lately -Continue monitoring  PLAN: - Recent imaging and today's labs reviewed -Continue breast cancer surveillance -Follow-up in 6 months, or sooner if needed -CEM 06/2024  Orders Placed This Encounter  Procedures   Vitamin B12    Standing Status:   Future    Expected Date:   09/22/2023    Expiration Date:   09/21/2024      All questions were answered. The patient knows to call the clinic with any problems, questions or concerns. No barriers to learning were detected.   Samaia Iwata K Jakarie Pember, NP 09/22/2023

## 2023-09-22 ENCOUNTER — Encounter: Payer: Self-pay | Admitting: Nurse Practitioner

## 2023-09-22 ENCOUNTER — Inpatient Hospital Stay: Payer: Medicare HMO | Admitting: Nurse Practitioner

## 2023-09-22 ENCOUNTER — Inpatient Hospital Stay: Payer: Medicare HMO | Attending: Nurse Practitioner

## 2023-09-22 VITALS — BP 150/90 | HR 73 | Temp 98.0°F | Resp 17 | Wt 216.7 lb

## 2023-09-22 DIAGNOSIS — R7401 Elevation of levels of liver transaminase levels: Secondary | ICD-10-CM | POA: Diagnosis not present

## 2023-09-22 DIAGNOSIS — E039 Hypothyroidism, unspecified: Secondary | ICD-10-CM | POA: Insufficient documentation

## 2023-09-22 DIAGNOSIS — Z923 Personal history of irradiation: Secondary | ICD-10-CM | POA: Diagnosis not present

## 2023-09-22 DIAGNOSIS — Z9221 Personal history of antineoplastic chemotherapy: Secondary | ICD-10-CM | POA: Insufficient documentation

## 2023-09-22 DIAGNOSIS — C50411 Malignant neoplasm of upper-outer quadrant of right female breast: Secondary | ICD-10-CM

## 2023-09-22 DIAGNOSIS — G4730 Sleep apnea, unspecified: Secondary | ICD-10-CM | POA: Insufficient documentation

## 2023-09-22 DIAGNOSIS — Z79811 Long term (current) use of aromatase inhibitors: Secondary | ICD-10-CM | POA: Insufficient documentation

## 2023-09-22 DIAGNOSIS — I10 Essential (primary) hypertension: Secondary | ICD-10-CM | POA: Insufficient documentation

## 2023-09-22 DIAGNOSIS — K760 Fatty (change of) liver, not elsewhere classified: Secondary | ICD-10-CM | POA: Diagnosis not present

## 2023-09-22 DIAGNOSIS — E785 Hyperlipidemia, unspecified: Secondary | ICD-10-CM | POA: Insufficient documentation

## 2023-09-22 DIAGNOSIS — M797 Fibromyalgia: Secondary | ICD-10-CM | POA: Diagnosis not present

## 2023-09-22 DIAGNOSIS — C50412 Malignant neoplasm of upper-outer quadrant of left female breast: Secondary | ICD-10-CM | POA: Insufficient documentation

## 2023-09-22 DIAGNOSIS — D0512 Intraductal carcinoma in situ of left breast: Secondary | ICD-10-CM | POA: Diagnosis not present

## 2023-09-22 DIAGNOSIS — Z85828 Personal history of other malignant neoplasm of skin: Secondary | ICD-10-CM | POA: Insufficient documentation

## 2023-09-22 DIAGNOSIS — Z170 Estrogen receptor positive status [ER+]: Secondary | ICD-10-CM | POA: Diagnosis not present

## 2023-09-22 DIAGNOSIS — Z5189 Encounter for other specified aftercare: Secondary | ICD-10-CM | POA: Diagnosis not present

## 2023-09-22 LAB — CBC WITH DIFFERENTIAL (CANCER CENTER ONLY)
Abs Immature Granulocytes: 0.02 10*3/uL (ref 0.00–0.07)
Basophils Absolute: 0 10*3/uL (ref 0.0–0.1)
Basophils Relative: 1 %
Eosinophils Absolute: 0.1 10*3/uL (ref 0.0–0.5)
Eosinophils Relative: 2 %
HCT: 40.9 % (ref 36.0–46.0)
Hemoglobin: 14 g/dL (ref 12.0–15.0)
Immature Granulocytes: 0 %
Lymphocytes Relative: 23 %
Lymphs Abs: 1.6 10*3/uL (ref 0.7–4.0)
MCH: 30.2 pg (ref 26.0–34.0)
MCHC: 34.2 g/dL (ref 30.0–36.0)
MCV: 88.1 fL (ref 80.0–100.0)
Monocytes Absolute: 0.5 10*3/uL (ref 0.1–1.0)
Monocytes Relative: 8 %
Neutro Abs: 4.7 10*3/uL (ref 1.7–7.7)
Neutrophils Relative %: 66 %
Platelet Count: 234 10*3/uL (ref 150–400)
RBC: 4.64 MIL/uL (ref 3.87–5.11)
RDW: 13.6 % (ref 11.5–15.5)
WBC Count: 7.1 10*3/uL (ref 4.0–10.5)
nRBC: 0 % (ref 0.0–0.2)

## 2023-09-22 LAB — CMP (CANCER CENTER ONLY)
ALT: 28 U/L (ref 0–44)
AST: 24 U/L (ref 15–41)
Albumin: 4.4 g/dL (ref 3.5–5.0)
Alkaline Phosphatase: 106 U/L (ref 38–126)
Anion gap: 5 (ref 5–15)
BUN: 16 mg/dL (ref 8–23)
CO2: 30 mmol/L (ref 22–32)
Calcium: 9.3 mg/dL (ref 8.9–10.3)
Chloride: 106 mmol/L (ref 98–111)
Creatinine: 0.69 mg/dL (ref 0.44–1.00)
GFR, Estimated: >60 mL/min (ref >60)
Glucose, Bld: 94 mg/dL (ref 70–99)
Potassium: 3.6 mmol/L (ref 3.5–5.1)
Sodium: 141 mmol/L (ref 135–145)
Total Bilirubin: 0.7 mg/dL (ref 0.0–1.2)
Total Protein: 7.3 g/dL (ref 6.5–8.1)

## 2023-09-24 DIAGNOSIS — M542 Cervicalgia: Secondary | ICD-10-CM | POA: Diagnosis not present

## 2023-09-24 DIAGNOSIS — M436 Torticollis: Secondary | ICD-10-CM | POA: Diagnosis not present

## 2023-09-24 DIAGNOSIS — M6281 Muscle weakness (generalized): Secondary | ICD-10-CM | POA: Diagnosis not present

## 2023-09-25 DIAGNOSIS — T1490XD Injury, unspecified, subsequent encounter: Secondary | ICD-10-CM | POA: Diagnosis not present

## 2023-09-25 DIAGNOSIS — L239 Allergic contact dermatitis, unspecified cause: Secondary | ICD-10-CM | POA: Diagnosis not present

## 2023-10-01 DIAGNOSIS — M6281 Muscle weakness (generalized): Secondary | ICD-10-CM | POA: Diagnosis not present

## 2023-10-01 DIAGNOSIS — M542 Cervicalgia: Secondary | ICD-10-CM | POA: Diagnosis not present

## 2023-10-01 DIAGNOSIS — F418 Other specified anxiety disorders: Secondary | ICD-10-CM | POA: Diagnosis not present

## 2023-10-01 DIAGNOSIS — M436 Torticollis: Secondary | ICD-10-CM | POA: Diagnosis not present

## 2023-10-02 ENCOUNTER — Other Ambulatory Visit: Payer: Self-pay

## 2023-10-05 MED ORDER — POTASSIUM CHLORIDE CRYS ER 20 MEQ PO TBCR: 20.0000 meq | EXTENDED_RELEASE_TABLET | Freq: Every day | ORAL | 0 refills | Status: DC

## 2023-10-08 DIAGNOSIS — M436 Torticollis: Secondary | ICD-10-CM | POA: Diagnosis not present

## 2023-10-08 DIAGNOSIS — M6281 Muscle weakness (generalized): Secondary | ICD-10-CM | POA: Diagnosis not present

## 2023-10-08 DIAGNOSIS — M542 Cervicalgia: Secondary | ICD-10-CM | POA: Diagnosis not present

## 2023-10-15 DIAGNOSIS — F418 Other specified anxiety disorders: Secondary | ICD-10-CM | POA: Diagnosis not present

## 2023-10-15 DIAGNOSIS — M542 Cervicalgia: Secondary | ICD-10-CM | POA: Diagnosis not present

## 2023-10-15 DIAGNOSIS — M6281 Muscle weakness (generalized): Secondary | ICD-10-CM | POA: Diagnosis not present

## 2023-10-15 DIAGNOSIS — M436 Torticollis: Secondary | ICD-10-CM | POA: Diagnosis not present

## 2023-10-21 DIAGNOSIS — E039 Hypothyroidism, unspecified: Secondary | ICD-10-CM | POA: Diagnosis not present

## 2023-10-21 DIAGNOSIS — K760 Fatty (change of) liver, not elsewhere classified: Secondary | ICD-10-CM | POA: Diagnosis not present

## 2023-10-21 DIAGNOSIS — R5383 Other fatigue: Secondary | ICD-10-CM | POA: Diagnosis not present

## 2023-10-21 DIAGNOSIS — N959 Unspecified menopausal and perimenopausal disorder: Secondary | ICD-10-CM | POA: Diagnosis not present

## 2023-10-21 DIAGNOSIS — E559 Vitamin D deficiency, unspecified: Secondary | ICD-10-CM | POA: Diagnosis not present

## 2023-10-21 DIAGNOSIS — N951 Menopausal and female climacteric states: Secondary | ICD-10-CM | POA: Diagnosis not present

## 2023-10-21 DIAGNOSIS — R7989 Other specified abnormal findings of blood chemistry: Secondary | ICD-10-CM | POA: Diagnosis not present

## 2023-10-22 DIAGNOSIS — M5412 Radiculopathy, cervical region: Secondary | ICD-10-CM | POA: Diagnosis not present

## 2023-10-22 DIAGNOSIS — G5603 Carpal tunnel syndrome, bilateral upper limbs: Secondary | ICD-10-CM | POA: Diagnosis not present

## 2023-10-29 DIAGNOSIS — N8182 Incompetence or weakening of pubocervical tissue: Secondary | ICD-10-CM | POA: Diagnosis not present

## 2023-10-29 DIAGNOSIS — N950 Postmenopausal bleeding: Secondary | ICD-10-CM | POA: Diagnosis not present

## 2023-11-04 ENCOUNTER — Ambulatory Visit (INDEPENDENT_AMBULATORY_CARE_PROVIDER_SITE_OTHER): Payer: Medicare HMO

## 2023-11-04 ENCOUNTER — Telehealth: Payer: Self-pay | Admitting: Gastroenterology

## 2023-11-04 ENCOUNTER — Encounter: Payer: Self-pay | Admitting: Nurse Practitioner

## 2023-11-04 VITALS — Ht 65.0 in | Wt 216.0 lb

## 2023-11-04 DIAGNOSIS — Z0000 Encounter for general adult medical examination without abnormal findings: Secondary | ICD-10-CM

## 2023-11-04 DIAGNOSIS — F418 Other specified anxiety disorders: Secondary | ICD-10-CM | POA: Diagnosis not present

## 2023-11-04 NOTE — Progress Notes (Signed)
 Subjective:   [name] is a 68 y.o. who presents for a Medicare Wellness preventive visit.  As a reminder, Annual Wellness Visits don't include a physical exam, and some assessments may be limited, especially if this visit is performed virtually. We may recommend an in-person follow-up visit with your provider if needed.  Visit Complete: Virtual I connected with  [name] on 11/04/23 by a audio enabled telemedicine application and verified that I am speaking with the correct person using two identifiers.  Patient Location: Home  Provider Location: Home Office  I discussed the limitations of evaluation and management by telemedicine. The patient expressed understanding and agreed to proceed.  Vital Signs: Because this visit was a virtual/telehealth visit, some criteria may be missing or patient reported. Any vitals not documented were not able to be obtained and vitals that have been documented are patient reported.    Persons Participating in Visit: Patient.  AWV Questionnaire: No: Patient Medicare AWV questionnaire was not completed prior to this visit.  Cardiac Risk Factors include: advanced age (>32men, >23 women);hypertension     Objective:    Today's Vitals   11/04/23 1012  Weight: 216 lb (98 kg)  Height: 5' 5 (1.651 m)   Body mass index is 35.94 kg/m.     11/04/2023   10:21 AM 06/19/2023   11:43 AM 10/29/2022    3:28 PM 03/24/2022    9:37 AM 05/28/2021   11:33 AM 05/08/2021    4:12 PM 03/06/2021    4:01 PM  Advanced Directives  Does Patient Have a Medical Advance Directive? No No No No No No No  Would patient like information on creating a medical advance directive? No - Patient declined  No - Patient declined No - Patient declined No - Patient declined No - Patient declined No - Patient declined    Current Medications (verified) Outpatient Encounter Medications as of 11/04/2023  Medication Sig   ALPRAZolam (XANAX) 0.5 MG tablet Take 0.5  mg by mouth at bedtime as needed. (Patient not taking: Reported on 08/11/2023)   amLODipine (NORVASC) 10 MG tablet Take 10 mg by mouth daily.   Ascorbic Acid  (VITAMIN C ) 1000 MG tablet Take 1,000 mg by mouth daily.   b complex vitamins capsule Take 1 capsule by mouth daily.   Bioflavonoid Products (QUERCETIN COMPLEX IMMUNE PO) Take 1 capsule by mouth as needed.   Cholecalciferol  (VITAMIN D ) 50 MCG (2000 UT) tablet Take 4,000 Units by mouth daily.   melatonin 5 MG TABS Take 5 mg by mouth at bedtime.   Menaquinone-7 (VITAMIN K2) 100 MCG CAPS Take 100 mcg by mouth daily.   Multiple Vitamin (MULTIVITAMIN) tablet Take 1 tablet by mouth daily.   OVER THE COUNTER MEDICATION Take 2 capsules by mouth in the morning and at bedtime. Infla Med   OVER THE COUNTER MEDICATION Take 1 capsule by mouth daily. Essential Pro   pantoprazole  (PROTONIX ) 40 MG tablet Take 1 tablet (40 mg total) by mouth daily. (Patient not taking: Reported on 07/23/2023)   potassium chloride  SA (KLOR-CON  M) 20 MEQ tablet Take 1 tablet (20 mEq total) by mouth daily.   Probiotic Product (PROBIOTIC PO) Take 1 capsule by mouth daily.   thyroid  (ARMOUR THYROID ) 90 MG tablet Take 90 mg by mouth daily. 90 mg Saturday and Sunday and 45 mg Monday through Friday   triamcinolone  cream (KENALOG) 0.1 % Apply 1 Application topically. (Patient not taking: Reported on 08/11/2023)   UBIQUINOL PO Take by mouth.  ZINC GLUCONATE PO Take 10 mg by mouth daily.   No facility-administered encounter medications on file as of 11/04/2023.    Allergies (verified) Penicillins, Taxotere  [docetaxel ], Cefdinir, Cetirizine, Femara  [letrozole ], Pegfilgrastim , Irbesartan , Molds & smuts, and Tylenol  [acetaminophen ]   History: Past Medical History:  Diagnosis Date   Anal fissure    Atrial septal aneurysm / if pfo  echo 6 13  10/23/2011   Breast cancer (HCC)    Cataract    both eyes   Depression    Fatty liver    pre fatty liver   Fibromyalgia     Headache(784.0)    hx of migraines when younger   Heart palpitations    hx with normal holter event monitoring   Hx: UTI (urinary tract infection)    Hyperlipidemia    Hypertension    Hypothyroidism    Obesity    Personal history of chemotherapy    Personal history of radiation therapy    Pneumonia 1972   hx of   Polyp of colon    Serrated adenoma of colon 08/2012   Skin cancer    basal, squamous cell   Sleep apnea    uses cpap   Past Surgical History:  Procedure Laterality Date   BREAST LUMPECTOMY     BREAST LUMPECTOMY WITH RADIOACTIVE SEED AND SENTINEL LYMPH NODE BIOPSY Right 07/26/2020   Procedure: RIGHT BREAST LUMPECTOMY WITH RADIOACTIVE SEED AND SENTINEL LYMPH NODE BIOPSY;  Surgeon: Curvin Deward MOULD, MD;  Location: [facility];  Service: General;  Laterality: Right;   BREAST LUMPECTOMY WITH RADIOACTIVE SEED LOCALIZATION Left 07/26/2020   Procedure: LEFT BREAST LUMPECTOMY X 2  WITH RADIOACTIVE SEED LOCALIZATION;  Surgeon: Curvin Deward MOULD, MD;  Location: [facility] OR;  Service: General;  Laterality: Left;   COLONOSCOPY     DILATION AND CURETTAGE OF UTERUS  1978   ECTOPIC PREGNANCY SURGERY  1983   KNEE ARTHROSCOPY     both in past   POLYPECTOMY     SHOULDER OPEN ROTATOR CUFF REPAIR Left 07/07/2013   Procedure: LEFT SHOULDER MINI OPEN SUBACROMIAL DECOMPRESSION ROTATOR CUFF REPAIR AND POSSIBLE PATCH GRAFT ;  Surgeon: Reyes JAYSON Billing, MD;  Location: [facility];  Service: Orthopedics;  Laterality: Left;  with interscaline block   SKIN CANCER EXCISION Bilateral    arm, legs, and chest   TONSILLECTOMY     Family History  Problem Relation Age of Onset   Hypertension Mother        low borderline   Osteoporosis Mother    Hypertension Father    Liver disease Father        amyloid deceased   Hyperlipidemia Father    Melanoma Sister    Cancer Sister        melanoma    Juvenile Diabetes Daughter    ADD / ADHD Child    Hyperlipidemia Other        Maternal grandmother   Colon cancer Neg Hx     Stomach cancer Neg Hx    Colon polyps Neg Hx    Esophageal cancer Neg Hx    Rectal cancer Neg Hx    Social History   Socioeconomic History   Marital status: Married    Spouse name: Not on file   Number of children: 6   Years of education: Not on file   Highest education level: Not on file  Occupational History   Occupation: TEACHER ASST    Employer: [employer]  Tobacco Use   Smoking status:  Never   Smokeless tobacco: Never  Vaping Use   Vaping status: Never Used  Substance and Sexual Activity   Alcohol  use: Yes    Comment: rare   Drug use: No   Sexual activity: Not on file  Other Topics Concern   Not on file  Social History Narrative   Teachers Aide [employer]  Not workking since last February . 15 left shoulder surgery    Married   Special educ   HH of 4   3 outside dogs, 8 goats, 40 chickens and quail. [place]   Job stresses   Ex husband passes away   Not working after injur at school  Shoulder neck                Social Drivers of Health   Financial Resource Strain: Low Risk  (11/04/2023)   Overall Financial Resource Strain (CARDIA)    Difficulty of Paying Living Expenses: Not hard at all  Food Insecurity: No Food Insecurity (11/04/2023)   Hunger Vital Sign    Worried About Running Out of Food in the Last Year: Never true    Ran Out of Food in the Last Year: Never true  Transportation Needs: No Transportation Needs (11/04/2023)   PRAPARE - Administrator, Civil Service (Medical): No    Lack of Transportation (Non-Medical): No  Physical Activity: Insufficiently Active (11/04/2023)   Exercise Vital Sign    Days of Exercise per Week: 6 days    Minutes of Exercise per Session: 20 min  Stress: No Stress Concern Present (11/04/2023)   [employer] of Occupational Health - Occupational Stress Questionnaire    Feeling of Stress: Not at all  Social Connections: Socially Integrated (11/04/2023)   Social Connection and  Isolation Panel    Frequency of Communication with Friends and Family: More than three times a week    Frequency of Social Gatherings with Friends and Family: More than three times a week    Attends Religious Services: More than 4 times per year    Active Member of [employer] or Organizations: Yes    Attends Engineer, structural: More than 4 times per year    Marital Status: Married    Tobacco Counseling Counseling given: Not Answered    Clinical Intake:  Pre-visit preparation completed: Yes  Pain : No/denies pain     BMI - recorded: 35.94 Nutritional Status: BMI > 30  Obese Nutritional Risks: None Diabetes: No  Lab Results  Component Value Date   HGBA1C 5.2 06/01/2013   HGBA1C 5.4 12/27/2012   HGBA1C 5.6 06/10/2010     How often do you need to have someone help you when you read instructions, pamphlets, or other written materials from your doctor or pharmacy?: 1 - Never  Interpreter Needed?: No  Information entered by :: Rojelio Blush LPN   Activities of Daily Living     11/04/2023   10:19 AM  In your present state of health, do you have any difficulty performing the following activities:  Hearing? 0  Vision? 0  Difficulty concentrating or making decisions? 0  Walking or climbing stairs? 0  Dressing or bathing? 0  Doing errands, shopping? 0  Preparing Food and eating ? N  Using the Toilet? N  In the past six months, have you accidently leaked urine? N  Do you have problems with loss of bowel control? N  Managing your Medications? N  Managing your Finances? N  Housekeeping or managing your Housekeeping? N    Patient Care Team: Panosh, Apolinar POUR, MD as PCP - General Leva Rush, MD as Consulting Physician (Obstetrics and Gynecology) Dr DELENA Duwayne Purchase, MD as Consulting Physician (Orthopedic Surgery) Aneita Gwendlyn DASEN, MD (Inactive) as Consulting Physician (Gastroenterology) Burnetta Aures, MD as Consulting Physician (Orthopedic Surgery) Tyree Nanetta SAILOR, RN as Oncology Nurse Navigator Glean, Stephane BROCKS, RN (Inactive) as Oncology Nurse Navigator Curvin Deward MOULD, MD as Consulting Physician (General Surgery) Lanny Callander, MD as Consulting Physician (Hematology) Izell Domino, MD as Attending Physician (Radiation Oncology) Robinson Pao, MD as Consulting Physician (Dermatology) Pascal Alm PARAS, MD as Referring Physician (Cardiology) Burton, Lacie K, NP as Nurse Practitioner (Nurse Practitioner) Lionell Jon DEL, [facility] (Pharmacist)  I have updated your Care Teams any recent Medical Services you may have received from other providers in the past year.     Assessment:   This is a routine wellness examination for [name].  Hearing/Vision screen Hearing Screening - Comments:: Denies hearing difficulties   Vision Screening - Comments:: Wears rx glasses - up to date with routine eye exams with  Dr Newt   Goals Addressed               This Visit's Progress     Increase physical activity (pt-stated)        Remain active and lose weight       Depression Screen     11/04/2023   10:19 AM 10/29/2022    3:26 PM 09/12/2022   10:19 AM 02/03/2022    2:05 PM 05/28/2021   11:26 AM 06/11/2020    1:38 PM 07/30/2016   11:55 AM  PHQ 2/9 Scores  PHQ - 2 Score 0 0 0 0 0 0 0  PHQ- 9 Score  0 1 2  0     Fall Risk     11/04/2023   10:20 AM 10/29/2022    3:27 PM 09/12/2022   10:02 AM 02/03/2022    2:05 PM 05/28/2021   11:28 AM  Fall Risk   Falls in the past year? 1 0 0 0 0  Number falls in past yr: 0 0 0 0 0  Injury with Fall? 0 0 0 0 0  Risk for fall due to : No Fall Risks No Fall Risks Other (Comment) No Fall Risks   Follow up Falls evaluation completed Falls prevention discussed Falls evaluation completed Falls evaluation completed       Data saved with a previous flowsheet row definition    MEDICARE RISK AT HOME:  Medicare Risk at Home Any stairs in or around the home?: Yes If so, are there any without handrails?: No Home free of loose  throw rugs in walkways, pet beds, electrical cords, etc?: Yes Adequate lighting in your home to reduce risk of falls?: Yes Life alert?: No Use of a cane, walker or w/c?: No Grab bars in the bathroom?: No Shower chair or bench in shower?: No Elevated toilet seat or a handicapped toilet?: No  TIMED UP AND GO:  Was the test performed?  No  Cognitive Function: 6CIT completed        11/04/2023   10:21 AM 10/29/2022    3:28 PM 05/28/2021   11:30 AM  6CIT Screen  What Year? 0 points 0 points 0 points  What month? 0 points 0 points 0 points  What time? 0 points 0 points 0 points  Count back from 20 0 points 0 points 0 points  Months in reverse 0 points 0 points 0 points  Repeat phrase 0 points 0 points 0 points  Total Score 0 points 0 points 0 points    Immunizations Immunization History  Administered Date(s) Administered   Fluzone Influenza virus vaccine,trivalent (IIV3), split virus 02/18/2010   Hep A / Hep B 08/02/2014, 09/20/2014, 06/20/2015   Influenza Split 03/11/2011   Influenza Whole 02/18/2010   Influenza-Unspecified 03/11/2011   Td 01/10/1998   Tdap 03/28/2014    Screening Tests Health Maintenance  Topic Date Due   COVID-19 Vaccine (1) Never done   Pneumococcal Vaccine: 50+ Years (1 of 2 - PCV) Never done   Zoster Vaccines- Shingrix (1 of 2) Never done   INFLUENZA VACCINE  12/11/2023   DTaP/Tdap/Td (3 - Td or Tdap) 03/28/2024   Medicare Annual Wellness (AWV)  11/03/2024   MAMMOGRAM  06/14/2025   Colonoscopy  03/10/2029   Hepatitis B Vaccines  Completed   DEXA SCAN  Completed   Hepatitis C Screening  Completed   HPV VACCINES  Aged Out   Meningococcal B Vaccine  Aged Out    Health Maintenance  Health Maintenance Due  Topic Date Due   COVID-19 Vaccine (1) Never done   Pneumococcal Vaccine: 50+ Years (1 of 2 - PCV) Never done   Zoster Vaccines- Shingrix (1 of 2) Never done   Health Maintenance Items Addressed:   Additional Screening:  Vision  Screening: Recommended annual ophthalmology exams for early detection of glaucoma and other disorders of the eye. Would you like a referral to an eye doctor? No    Dental Screening: Recommended annual dental exams for proper oral hygiene  Community Resource Referral / Chronic Care Management: CRR required this visit?  No   CCM required this visit?  No   Plan:    I have personally reviewed and noted the following in the patient's chart:   Medical and social history Use of alcohol , tobacco or illicit drugs  Current medications and supplements including opioid prescriptions. Patient is not currently taking opioid prescriptions. Functional ability and status Nutritional status Physical activity Advanced directives List of other physicians Hospitalizations, surgeries, and ER visits in previous 12 months Vitals Screenings to include cognitive, depression, and falls Referrals and appointments  In addition, I have reviewed and discussed with patient certain preventive protocols, quality metrics, and best practice recommendations. A written personalized care plan for preventive services as well as general preventive health recommendations were provided to patient.   Rojelio LELON Blush, LPN   3/74/7974   After Visit Summary: (MyChart) Due to this being a telephonic visit, the after visit summary with patients personalized plan was offered to patient via MyChart   Notes: Nothing significant to report at this time.

## 2023-11-04 NOTE — Patient Instructions (Addendum)
 Ms. Flippo , Thank you for taking time out of your busy schedule to complete your Annual Wellness Visit with me. I enjoyed our conversation and look forward to speaking with you again next year. I, as well as your care team,  appreciate your ongoing commitment to your health goals. Please review the following plan we discussed and let me know if I can assist you in the future. Your Game plan/ To Do List    Referrals: If you haven't heard from the office you've been referred to, please reach out to them at the phone provided.   Follow up Visits: Next Medicare AWV with our clinical staff: 11/09/24/@ 10a   Have you seen your provider in the last 6 months (3 months if uncontrolled diabetes)? 06/16/23 Next Office Visit with your provider:   Clinician Recommendations:  Aim for 30 minutes of exercise or brisk walking, 6-8 glasses of water , and 5 servings of fruits and vegetables each day.       This is a list of the screening recommended for you and due dates:  Health Maintenance  Topic Date Due   COVID-19 Vaccine (1) Never done   Pneumococcal Vaccine for age over 75 (1 of 2 - PCV) Never done   Zoster (Shingles) Vaccine (1 of 2) Never done   Flu Shot  12/11/2023   DTaP/Tdap/Td vaccine (3 - Td or Tdap) 03/28/2024   Medicare Annual Wellness Visit  11/03/2024   Mammogram  06/14/2025   Colon Cancer Screening  03/10/2029   Hepatitis B Vaccine  Completed   DEXA scan (bone density measurement)  Completed   Hepatitis C Screening  Completed   HPV Vaccine  Aged Out   Meningitis B Vaccine  Aged Out    Advanced directives: (Declined) Advance directive discussed with you today. Even though you declined this today, please call our office should you change your mind, and we can give you the proper paperwork for you to fill out. Advance Care Planning is important because it:  [x]  Makes sure you receive the medical care that is consistent with your values, goals, and preferences  [x]  It provides guidance  to your family and loved ones and reduces their decisional burden about whether or not they are making the right decisions based on your wishes.  Follow the link provided in your after visit summary or read over the paperwork we have mailed to you to help you started getting your Advance Directives in place. If you need assistance in completing these, please reach out to us  so that we can help you!  See attachments for Preventive Care and Fall Prevention Tips.

## 2023-11-04 NOTE — Telephone Encounter (Signed)
 I spoke with the pt and made her aware that we mailed a letter out with results in April. She is not sure if she received the letter so we discussed the letter in detail. No further questions or concerns.

## 2023-11-04 NOTE — Telephone Encounter (Signed)
 Patient requesting a call to discuss 08/11/23 colonoscopy results further. States she has now been able to read results more in depth and has further questions. Please advise, thank you

## 2023-11-17 DIAGNOSIS — N950 Postmenopausal bleeding: Secondary | ICD-10-CM | POA: Diagnosis not present

## 2023-11-23 ENCOUNTER — Ambulatory Visit: Payer: Medicare HMO | Attending: Nurse Practitioner

## 2023-11-23 ENCOUNTER — Ambulatory Visit
Admission: RE | Admit: 2023-11-23 | Discharge: 2023-11-23 | Disposition: A | Discharge status: Home / Self Care | Payer: Medicare HMO | Source: Ambulatory Visit | Attending: Internal Medicine | Admitting: Internal Medicine

## 2023-11-23 VITALS — Wt 216.5 lb

## 2023-11-23 DIAGNOSIS — K824 Cholesterolosis of gallbladder: Secondary | ICD-10-CM

## 2023-11-23 DIAGNOSIS — K828 Other specified diseases of gallbladder: Secondary | ICD-10-CM | POA: Diagnosis not present

## 2023-11-23 DIAGNOSIS — M545 Low back pain, unspecified: Secondary | ICD-10-CM | POA: Diagnosis not present

## 2023-11-23 DIAGNOSIS — Z483 Aftercare following surgery for neoplasm: Secondary | ICD-10-CM | POA: Insufficient documentation

## 2023-11-23 DIAGNOSIS — G8929 Other chronic pain: Secondary | ICD-10-CM | POA: Diagnosis not present

## 2023-11-23 NOTE — Therapy (Signed)
 OUTPATIENT PHYSICAL THERAPY SOZO SCREENING NOTE   Patient Name: [name] MRN: [id number] DOB:1956/02/19, 68 y.o., female Today's Date: 11/23/2023  PCP: Charlett Apolinar POUR, MD REFERRING PROVIDER: Ann Mayme POUR, NP   PT End of Session - 11/23/23 0959     Visit Number 9   # unchanged due to screen only   PT Start Time 0958    PT Stop Time 1002    PT Time Calculation (min) 4 min    Activity Tolerance Patient tolerated treatment well    Behavior During Therapy [facility] for tasks assessed/performed          Past Medical History:  Diagnosis Date   Anal fissure    Atrial septal aneurysm / if pfo  echo 6 13  10/23/2011   Breast cancer (HCC)    Cataract    both eyes   Depression    Fatty liver    pre fatty liver   Fibromyalgia    Headache(784.0)    hx of migraines when younger   Heart palpitations    hx with normal holter event monitoring   Hx: UTI (urinary tract infection)    Hyperlipidemia    Hypertension    Hypothyroidism    Obesity    Personal history of chemotherapy    Personal history of radiation therapy    Pneumonia 1972   hx of   Polyp of colon    Serrated adenoma of colon 08/2012   Skin cancer    basal, squamous cell   Sleep apnea    uses cpap   Past Surgical History:  Procedure Laterality Date   BREAST LUMPECTOMY     BREAST LUMPECTOMY WITH RADIOACTIVE SEED AND SENTINEL LYMPH NODE BIOPSY Right 07/26/2020   Procedure: RIGHT BREAST LUMPECTOMY WITH RADIOACTIVE SEED AND SENTINEL LYMPH NODE BIOPSY;  Surgeon: Curvin Deward MOULD, MD;  Location: [facility] OR;  Service: General;  Laterality: Right;   BREAST LUMPECTOMY WITH RADIOACTIVE SEED LOCALIZATION Left 07/26/2020   Procedure: LEFT BREAST LUMPECTOMY X 2  WITH RADIOACTIVE SEED LOCALIZATION;  Surgeon: Curvin Deward MOULD, MD;  Location: [facility] OR;  Service: General;  Laterality: Left;   COLONOSCOPY     DILATION AND CURETTAGE OF UTERUS  1978   ECTOPIC PREGNANCY SURGERY  1983   KNEE ARTHROSCOPY     both in past    POLYPECTOMY     SHOULDER OPEN ROTATOR CUFF REPAIR Left 07/07/2013   Procedure: LEFT SHOULDER MINI OPEN SUBACROMIAL DECOMPRESSION ROTATOR CUFF REPAIR AND POSSIBLE PATCH GRAFT ;  Surgeon: Reyes JAYSON Billing, MD;  Location: [facility];  Service: Orthopedics;  Laterality: Left;  with interscaline block   SKIN CANCER EXCISION Bilateral    arm, legs, and chest   TONSILLECTOMY     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   Hepatic steatosis 11/27/2014   Hx of adenomatous colonic polyps 11/27/2014   Visit for preventive health examination 03/28/2014   Hyperlipidemia 03/28/2014   Left rotator cuff tear 07/07/2013   Tear of left rotator cuff 07/07/2013   Rhinitis 06/01/2013   Chest wall deformity 04/12/2012   Atrial septal aneurysm / if pfo  echo 6 13  10/23/2011   OSA (obstructive sleep apnea) 11/27/2010   Dyslipidemia 11/01/2010   Flushing 08/29/2010   Labile hypertension 08/29/2010   MUSCLE  CRAMPS, FOOT 06/10/2010   OTHER SLEEP DISTURBANCES 06/10/2010   ANXIETY, SITUATIONAL 05/08/2010   VERTIGO, POSITIONAL 03/15/2010   VITAMIN D  DEFICIENCY 02/18/2010   OBESITY 02/18/2010   ALLERGIC RHINITIS 02/18/2010   PLANTAR FASCIITIS 02/18/2010   TWITCHING 06/11/2009   NUMBNESS, HAND 06/11/2009   CERVICAL STRAIN, ACUTE 06/11/2009   OTHER MALAISE AND FATIGUE 07/09/2007   HYPERTENSION 01/28/2007   Hypothyroidism 11/11/2006   GERD 11/11/2006   FIBROMYALGIA 11/11/2006    REFERRING DIAG: Rt breast cancer  THERAPY DIAG: Aftercare following surgery for neoplasm  PERTINENT HISTORY:   Patient was diagnosed on 05/22/2020 with 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 She underwent a left lumpectomy for DCIS and a right lumpectomy and sentinel node biopsy on  07/26/2020. She had 2 lymph nodes removed with 1 being positive for cancer on the right side. It is ER/PR positive. Left shoulder rotator cuff repair 06/2013. Last radiation on 01/15/21.  Seroma that was drained x 2-3.     PRECAUTIONS: Rt UE lymphedema risk  SUBJECTIVE: Pt returns for her 3 month L-Dex screen.   PAIN: Are you having pain? No  SOZO SCREENING: Patient was assessed today using the SOZO machine to determine the lymphedema index score. This was compared to her baseline score. It was determined that she is within the recommended range when compared to her baseline and no further action is needed at this time. She will return in 3 months for her next SOZO screen.  Plan: Pt will begin 6 month screens.   L-DEX FLOWSHEETS - 11/23/23 1000       L-DEX LYMPHEDEMA SCREENING   Measurement Type Unilateral    L-DEX MEASUREMENT EXTREMITY Upper Extremity    POSITION  Standing    DOMINANT SIDE Right    At Risk Side Right    BASELINE SCORE (UNILATERAL) 3.5    L-DEX SCORE (UNILATERAL) 4.8    VALUE CHANGE (UNILAT) 1.3           Berwyn Knights, PTA 11/23/23 10:00 AM

## 2023-11-24 ENCOUNTER — Telehealth: Payer: Self-pay

## 2023-11-24 ENCOUNTER — Ambulatory Visit: Payer: Self-pay | Admitting: Internal Medicine

## 2023-11-24 DIAGNOSIS — E049 Nontoxic goiter, unspecified: Secondary | ICD-10-CM | POA: Diagnosis not present

## 2023-11-24 DIAGNOSIS — E042 Nontoxic multinodular goiter: Secondary | ICD-10-CM | POA: Diagnosis not present

## 2023-11-24 DIAGNOSIS — K824 Cholesterolosis of gallbladder: Secondary | ICD-10-CM

## 2023-11-24 NOTE — Progress Notes (Signed)
 So   as y ou can see  the gall bladder polyp  meets criteria for risk  of malignancy  and  removal evaluation consultation  is in order .  Advise a referral to surgery team for cholecystectomy consult    we usually send to the surgical group in town . (If you have a different  preference let us  know ) Please place referral if patient agrees

## 2023-11-24 NOTE — Telephone Encounter (Signed)
 CRITICAL VALUE STICKER  CRITICAL VALUE:  RECEIVER (on-site recipient of call): Camelia Hind, BSN, RN  DATE & TIME NOTIFIED: 11/24/2023 0850  MESSENGER (representative from lab): Diane, [facility]  MD NOTIFIED: Dr. Charlett  TIME OF NOTIFICATION:0850  RESPONSE: Referral will be made to surgical team.

## 2023-11-30 DIAGNOSIS — G8929 Other chronic pain: Secondary | ICD-10-CM | POA: Diagnosis not present

## 2023-11-30 DIAGNOSIS — M545 Low back pain, unspecified: Secondary | ICD-10-CM | POA: Diagnosis not present

## 2023-12-04 DIAGNOSIS — I10 Essential (primary) hypertension: Secondary | ICD-10-CM | POA: Diagnosis not present

## 2023-12-04 DIAGNOSIS — R002 Palpitations: Secondary | ICD-10-CM | POA: Diagnosis not present

## 2023-12-07 DIAGNOSIS — G8929 Other chronic pain: Secondary | ICD-10-CM | POA: Diagnosis not present

## 2023-12-07 DIAGNOSIS — M545 Low back pain, unspecified: Secondary | ICD-10-CM | POA: Diagnosis not present

## 2023-12-09 ENCOUNTER — Ambulatory Visit (INDEPENDENT_AMBULATORY_CARE_PROVIDER_SITE_OTHER): Admitting: Internal Medicine

## 2023-12-09 ENCOUNTER — Encounter: Payer: Self-pay | Admitting: Internal Medicine

## 2023-12-09 VITALS — BP 140/80 | HR 84 | Temp 98.2°F | Ht 65.0 in | Wt 216.6 lb

## 2023-12-09 DIAGNOSIS — K824 Cholesterolosis of gallbladder: Secondary | ICD-10-CM | POA: Diagnosis not present

## 2023-12-09 DIAGNOSIS — R21 Rash and other nonspecific skin eruption: Secondary | ICD-10-CM | POA: Diagnosis not present

## 2023-12-09 DIAGNOSIS — R1012 Left upper quadrant pain: Secondary | ICD-10-CM

## 2023-12-09 MED ORDER — PANTOPRAZOLE SODIUM 40 MG PO TBEC: 40.0000 mg | DELAYED_RELEASE_TABLET | Freq: Every day | ORAL | 0 refills | Status: AC

## 2023-12-09 NOTE — Patient Instructions (Addendum)
 As planned and prn .

## 2023-12-09 NOTE — Progress Notes (Signed)
 Chief Complaint  Patient presents with   Medical Management of Chronic Issues    Pt is here with husband- Jackee, would like to follow up US  result. Pt reports polyp has grown.    Abdominal Pain    Pt c/o ongoing L UQ pain. Feel burning pain when eat spicy food or bitter melon. Sometimes feels sharp pain when change position. See nutritionist. Advise to eat aloe vera. Aloe vera helps some.     HPI: [name] 68 y.o. come in for disc of imaging  and fu luabd pain. And with spouse  US  showed  enlarging polyp   has appt with Dr Curvin surgeon tomorrow  has ? About  GB etc  Luq post prandial burning  and when  turns  over at times   no progression   Gi advised trial of med  that she lost bottle  ? Should she take it . Had unremarkable abd ct or luq  ( old renal cyst )  No vomiting   Rash for a week or so suprapubic area ? If  intertriginous of bite  can she use betamethasone/clotrimazole on it ( left over itches  no drainage.  ROS: See pertinent positives and negatives per HPI. Has some pelvic prolapse using pessary has appt w uro gyne in fall   Past Medical History:  Diagnosis Date   Anal fissure    Atrial septal aneurysm / if pfo  echo 6 13  10/23/2011   Breast cancer (HCC)    Cataract    both eyes   Depression    Fatty liver    pre fatty liver   Fibromyalgia    Headache(784.0)    hx of migraines when younger   Heart palpitations    hx with normal holter event monitoring   Hx: UTI (urinary tract infection)    Hyperlipidemia    Hypertension    Hypothyroidism    Obesity    Personal history of chemotherapy    Personal history of radiation therapy    Pneumonia 1972   hx of   Polyp of colon    Serrated adenoma of colon 08/2012   Skin cancer    basal, squamous cell   Sleep apnea    uses cpap    Family History  Problem Relation Age of Onset   Hypertension Mother        low borderline   Osteoporosis Mother    Hypertension Father    Liver disease Father         amyloid deceased   Hyperlipidemia Father    Melanoma Sister    Cancer Sister        melanoma    Juvenile Diabetes Daughter    ADD / ADHD Child    Hyperlipidemia Other        Maternal grandmother   Colon cancer Neg Hx    Stomach cancer Neg Hx    Colon polyps Neg Hx    Esophageal cancer Neg Hx    Rectal cancer Neg Hx     Social History   Socioeconomic History   Marital status: Married    Spouse name: Not on file   Number of children: 6   Years of education: Not on file   Highest education level: Not on file  Occupational History   Occupation: TEACHER ASST    Employer: [employer]  Tobacco Use   Smoking status: Never   Smokeless tobacco: Never  Vaping Use   Vaping  status: Never Used  Substance and Sexual Activity   Alcohol  use: Yes    Comment: rare   Drug use: No   Sexual activity: Not on file  Other Topics Concern   Not on file  Social History Narrative   Teachers Aide [employer]  Not workking since last February . 15 left shoulder surgery    Married   Special educ   HH of 4   3 outside dogs, 8 goats, 40 chickens and quail. [place]   Job stresses   Ex husband passes away   Not working after injur at school  Shoulder neck                Social Drivers of Health   Financial Resource Strain: Low Risk  (11/04/2023)   Overall Financial Resource Strain (CARDIA)    Difficulty of Paying Living Expenses: Not hard at all  Food Insecurity: No Food Insecurity (11/04/2023)   Hunger Vital Sign    Worried About Running Out of Food in the Last Year: Never true    Ran Out of Food in the Last Year: Never true  Transportation Needs: No Transportation Needs (11/04/2023)   PRAPARE - Administrator, Civil Service (Medical): No    Lack of Transportation (Non-Medical): No  Physical Activity: Insufficiently Active (11/04/2023)   Exercise Vital Sign    Days of Exercise per Week: 6 days    Minutes of Exercise per Session: 20 min   Stress: No Stress Concern Present (11/04/2023)   [employer] of Occupational Health - Occupational Stress Questionnaire    Feeling of Stress: Not at all  Social Connections: Socially Integrated (11/04/2023)   Social Connection and Isolation Panel    Frequency of Communication with Friends and Family: More than three times a week    Frequency of Social Gatherings with Friends and Family: More than three times a week    Attends Religious Services: More than 4 times per year    Active Member of [employer] or Organizations: Yes    Attends Engineer, structural: More than 4 times per year    Marital Status: Married    Outpatient Medications Prior to Visit  Medication Sig Dispense Refill   amLODipine (NORVASC) 10 MG tablet Take 10 mg by mouth daily.     Ascorbic Acid  (VITAMIN C ) 1000 MG tablet Take 1,000 mg by mouth daily.     b complex vitamins capsule Take 1 capsule by mouth daily.     Bioflavonoid Products (QUERCETIN COMPLEX IMMUNE PO) Take 1 capsule by mouth as needed.     Cholecalciferol  (VITAMIN D ) 50 MCG (2000 UT) tablet Take 4,000 Units by mouth daily.     Digestive Aids Mixture (BILE ACID FACTORS PO) Take by mouth daily.     melatonin 5 MG TABS Take 5 mg by mouth at bedtime. (Patient taking differently: Take 5 mg by mouth at bedtime. As needed.)     Menaquinone-7 (VITAMIN K2) 100 MCG CAPS Take 100 mcg by mouth daily.     Multiple Vitamin (MULTIVITAMIN) tablet Take 1 tablet by mouth daily.     OVER THE COUNTER MEDICATION Take 2 capsules by mouth in the morning and at bedtime. Infla Med     OVER THE COUNTER MEDICATION Take 1 capsule by mouth daily. Essential Pro     potassium chloride  SA (KLOR-CON  M) 20 MEQ tablet Take 1 tablet (20 mEq total) by mouth daily. 90 tablet 0  Probiotic Product (PROBIOTIC PO) Take 1 capsule by mouth daily.     thyroid  (ARMOUR THYROID ) 90 MG tablet Take 90 mg by mouth daily. 90 mg Saturday and Sunday and 45 mg Monday through Friday     UBIQUINOL PO  Take by mouth.     ALPRAZolam (XANAX) 0.5 MG tablet Take 0.5 mg by mouth at bedtime as needed. (Patient not taking: Reported on 12/09/2023)     NALTREXONE HCL PO Take 2 mg by mouth in the morning and at bedtime.     triamcinolone  cream (KENALOG) 0.1 % Apply 1 Application topically. (Patient not taking: Reported on 12/09/2023)     ZINC GLUCONATE PO Take 10 mg by mouth daily. (Patient not taking: Reported on 12/09/2023)     pantoprazole  (PROTONIX ) 40 MG tablet Take 1 tablet (40 mg total) by mouth daily. (Patient not taking: Reported on 12/09/2023) 30 tablet 0   No facility-administered medications prior to visit.     EXAM:  BP (!) 140/80 (BP Location: Left Arm, Patient Position: Sitting, Cuff Size: Large)   Pulse 84   Temp 98.2 F (36.8 C) (Oral)   Ht 5' 5 (1.651 m)   Wt 216 lb 9.6 oz (98.2 kg)   LMP  (LMP Unknown)   SpO2 98%   BMI 36.04 kg/m   Body mass index is 36.04 kg/m.  GENERAL: vitals reviewed and listed above, alert, oriented, appears well hydrated and in no acute distress HEENT: atraumatic, conjunctiva  clear, no obvious abnormalities on inspection of external nose and ears  Abd soft  skin suprapubic are owith 2.5 ovoid red scaly rash with satellite   no weeping no pustules or vesicles MS: moves all extremities without noticeable focal  abnormality PSYCH: pleasant and cooperative, no obvious depression or anxiety Lab Results  Component Value Date   WBC 7.1 09/22/2023   HGB 14.0 09/22/2023   HCT 40.9 09/22/2023   PLT 234 09/22/2023   GLUCOSE 94 09/22/2023   CHOL 226 (A) 06/05/2020   TRIG 56 08/05/2019   HDL 57 06/05/2020   LDLDIRECT 165.2 12/27/2012   LDLCALC 156 06/05/2020   ALT 28 09/22/2023   AST 24 09/22/2023   NA 141 09/22/2023   K 3.6 09/22/2023   CL 106 09/22/2023   CREATININE 0.69 09/22/2023   BUN 16 09/22/2023   CO2 30 09/22/2023   TSH 0.674 04/11/2021   HGBA1C 5.2 06/01/2013   BP Readings from Last 3 Encounters:  12/09/23 (!) 140/80  09/22/23 (!)  150/90  08/11/23 (!) 158/86    ASSESSMENT AND PLAN:  Discussed the following assessment and plan:  Gall bladder polyp - enlarged and at risk for malignancy disc  surgery evaluation pending  Left upper quadrant abdominal pain  Rash Reviewed findings   abd  CT and us  scans  Will rx the protonix  ( record review) and refill med ( bottle was lost) to try for  a month as  advised per GI .   Share with surgeon the issue of lu  abd pain esp after eating certain foods  .  Reviewed GB function and purpose and  surgeon will review  process and advice of removal. Plan.   -Patient advised to return or notify health care team  if  new concerns arise. In interim    Expectant management.  Review and counsel plan 32 minutes  Patient Instructions  As planned and prn .   Rhea Kaelin K. Dasiah Hooley M.D.

## 2023-12-10 ENCOUNTER — Ambulatory Visit: Payer: Self-pay | Admitting: General Surgery

## 2023-12-10 DIAGNOSIS — K824 Cholesterolosis of gallbladder: Secondary | ICD-10-CM | POA: Diagnosis not present

## 2023-12-14 DIAGNOSIS — G8929 Other chronic pain: Secondary | ICD-10-CM | POA: Diagnosis not present

## 2023-12-14 DIAGNOSIS — M545 Low back pain, unspecified: Secondary | ICD-10-CM | POA: Diagnosis not present

## 2023-12-21 ENCOUNTER — Encounter: Payer: Self-pay | Admitting: Nurse Practitioner

## 2023-12-22 DIAGNOSIS — Z79899 Other long term (current) drug therapy: Secondary | ICD-10-CM | POA: Diagnosis not present

## 2023-12-22 DIAGNOSIS — C50919 Malignant neoplasm of unspecified site of unspecified female breast: Secondary | ICD-10-CM | POA: Diagnosis not present

## 2023-12-22 DIAGNOSIS — Z5181 Encounter for therapeutic drug level monitoring: Secondary | ICD-10-CM | POA: Diagnosis not present

## 2023-12-22 DIAGNOSIS — E559 Vitamin D deficiency, unspecified: Secondary | ICD-10-CM | POA: Diagnosis not present

## 2023-12-22 DIAGNOSIS — K760 Fatty (change of) liver, not elsewhere classified: Secondary | ICD-10-CM | POA: Diagnosis not present

## 2023-12-22 DIAGNOSIS — R7309 Other abnormal glucose: Secondary | ICD-10-CM | POA: Diagnosis not present

## 2023-12-22 DIAGNOSIS — R945 Abnormal results of liver function studies: Secondary | ICD-10-CM | POA: Diagnosis not present

## 2023-12-22 DIAGNOSIS — E519 Thiamine deficiency, unspecified: Secondary | ICD-10-CM | POA: Diagnosis not present

## 2023-12-22 DIAGNOSIS — R7989 Other specified abnormal findings of blood chemistry: Secondary | ICD-10-CM | POA: Diagnosis not present

## 2023-12-22 DIAGNOSIS — I10 Essential (primary) hypertension: Secondary | ICD-10-CM | POA: Diagnosis not present

## 2023-12-22 DIAGNOSIS — R5383 Other fatigue: Secondary | ICD-10-CM | POA: Diagnosis not present

## 2023-12-22 DIAGNOSIS — E049 Nontoxic goiter, unspecified: Secondary | ICD-10-CM | POA: Diagnosis not present

## 2023-12-26 DIAGNOSIS — R002 Palpitations: Secondary | ICD-10-CM | POA: Diagnosis not present

## 2023-12-27 DIAGNOSIS — I471 Supraventricular tachycardia, unspecified: Secondary | ICD-10-CM | POA: Diagnosis not present

## 2023-12-29 DIAGNOSIS — G8929 Other chronic pain: Secondary | ICD-10-CM | POA: Diagnosis not present

## 2023-12-29 DIAGNOSIS — M25511 Pain in right shoulder: Secondary | ICD-10-CM | POA: Diagnosis not present

## 2024-01-01 ENCOUNTER — Other Ambulatory Visit: Payer: Self-pay | Admitting: Internal Medicine

## 2024-01-01 DIAGNOSIS — M25511 Pain in right shoulder: Secondary | ICD-10-CM | POA: Diagnosis not present

## 2024-01-04 DIAGNOSIS — M19011 Primary osteoarthritis, right shoulder: Secondary | ICD-10-CM | POA: Diagnosis not present

## 2024-01-04 DIAGNOSIS — M25511 Pain in right shoulder: Secondary | ICD-10-CM | POA: Diagnosis not present

## 2024-01-04 DIAGNOSIS — G8929 Other chronic pain: Secondary | ICD-10-CM | POA: Diagnosis not present

## 2024-01-06 ENCOUNTER — Encounter: Payer: Self-pay | Admitting: Internal Medicine

## 2024-01-06 ENCOUNTER — Encounter (HOSPITAL_COMMUNITY): Payer: Self-pay

## 2024-01-06 ENCOUNTER — Telehealth: Payer: Self-pay | Admitting: Gastroenterology

## 2024-01-06 NOTE — Telephone Encounter (Signed)
 Received a phone call from patient, is requesting f/u phone call to review last colonoscopy and in depth discussion about possible scheduling. Please review and advise  Thank you

## 2024-01-06 NOTE — Telephone Encounter (Signed)
 Spoke with the pt over the phone and discussed that she is due for repeat colon in 2030.  She was also advised to call if she has any concerns in the meantime.  All questions answered to the best of my ability.

## 2024-01-06 NOTE — Pre-Procedure Instructions (Signed)
 Surgical Instructions   Your procedure is scheduled on January 13, 2024. Report to [facility] Main Entrance A at 6:30 A.M., then check in with the Admitting office. Any questions or running late day of surgery: call [phone]  Questions prior to your surgery date: call [phone], Monday-Friday, 8am-4pm. If you experience any cold or flu symptoms such as cough, fever, chills, shortness of breath, etc. between now and your scheduled surgery, please notify us  at the above number.     Remember:  Do not eat after midnight the night before your surgery   You may drink clear liquids until 5:30 AM the morning of your surgery.   Clear liquids allowed are: Water , Non-Citrus Juices (without pulp), Carbonated Beverages, Clear Tea (no milk, honey, etc.), Black Coffee Only (NO MILK, CREAM OR POWDERED CREAMER of any kind), and Gatorade.    Take these medicines the morning of surgery with A SIP OF WATER : amLODipine (NORVASC)  colchicine  thyroid  (ARMOUR THYROID )    Please contact prescribing physician for instructions regarding your NALTREXONE.   One week prior to surgery, STOP taking any Aspirin (unless otherwise instructed by your surgeon) Aleve, Naproxen, Ibuprofen, Motrin, Advil, Goody's, BC's, all herbal medications, Omega-3 Fatty Acids (OMEGA 3 PO), and non-prescription vitamins.                     Do NOT Smoke (Tobacco/Vaping) for 24 hours prior to your procedure.  If you use a CPAP at night, you may bring your mask/headgear for your overnight stay.   You will be asked to remove any contacts, glasses, piercing's, hearing aid's, dentures/partials prior to surgery. Please bring cases for these items if needed.    Patients discharged the day of surgery will not be allowed to drive home, and someone needs to stay with them for 24 hours.  SURGICAL WAITING ROOM VISITATION Patients may have no more than 2 support people in the waiting area - these visitors may rotate.   Pre-op  nurse will coordinate an appropriate time for 1 ADULT support person, who may not rotate, to accompany patient in pre-op.  Children under the age of 67 must have an adult with them who is not the patient and must remain in the main waiting area with an adult.  If the patient needs to stay at the hospital during part of their recovery, the visitor guidelines for inpatient rooms apply.  Please refer to the [facility] website for the visitor guidelines for any additional information.   If you received a COVID test during your pre-op visit  it is requested that you wear a mask when out in public, stay away from anyone that may not be feeling well and notify your surgeon if you develop symptoms. If you have been in contact with anyone that has tested positive in the last 10 days please notify you surgeon.      Pre-operative CHG Bathing Instructions   You can play a key role in reducing the risk of infection after surgery. Your skin needs to be as free of germs as possible. You can reduce the number of germs on your skin by washing with CHG (chlorhexidine  gluconate) soap before surgery. CHG is an antiseptic soap that kills germs and continues to kill germs even after washing.   DO NOT use if you have an allergy to chlorhexidine /CHG or antibacterial soaps. If your skin becomes reddened or irritated, stop using the CHG and notify one of our RNs at [phone].  TAKE A SHOWER THE NIGHT BEFORE SURGERY AND THE DAY OF SURGERY    Please keep in mind the following:  DO NOT shave, including legs and underarms, 48 hours prior to surgery.   You may shave your face before/day of surgery.  Place clean sheets on your bed the night before surgery Use a clean washcloth (not used since being washed) for each shower. DO NOT sleep with pet's night before surgery.  CHG Shower Instructions:  Wash your face and private area with normal soap. If you choose to wash your hair, wash first with your normal  shampoo.  After you use shampoo/soap, rinse your hair and body thoroughly to remove shampoo/soap residue.  Turn the water  OFF and apply half the bottle of CHG soap to a CLEAN washcloth.  Apply CHG soap ONLY FROM YOUR NECK DOWN TO YOUR TOES (washing for 3-5 minutes)  DO NOT use CHG soap on face, private areas, open wounds, or sores.  Pay special attention to the area where your surgery is being performed.  If you are having back surgery, having someone wash your back for you may be helpful. Wait 2 minutes after CHG soap is applied, then you may rinse off the CHG soap.  Pat dry with a clean towel  Put on clean pajamas    Additional instructions for the day of surgery: DO NOT APPLY any lotions, deodorants, cologne, or perfumes.   Do not wear jewelry or makeup Do not wear nail polish, gel polish, artificial nails, or any other type of covering on natural nails (fingers and toes) Do not bring valuables to the hospital. [facility] is not responsible for valuables/personal belongings. Put on clean/comfortable clothes.  Please brush your teeth.  Ask your nurse before applying any prescription medications to the skin.

## 2024-01-07 ENCOUNTER — Other Ambulatory Visit: Payer: Self-pay

## 2024-01-07 ENCOUNTER — Encounter (HOSPITAL_COMMUNITY)
Admission: RE | Admit: 2024-01-07 | Discharge: 2024-01-07 | Disposition: A | Discharge status: Home / Self Care | Source: Ambulatory Visit | Attending: General Surgery | Admitting: General Surgery

## 2024-01-07 ENCOUNTER — Encounter (HOSPITAL_COMMUNITY): Payer: Self-pay

## 2024-01-07 VITALS — BP 139/79 | HR 74 | Temp 98.4°F | Resp 17 | Ht 65.0 in | Wt 217.0 lb

## 2024-01-07 DIAGNOSIS — K824 Cholesterolosis of gallbladder: Secondary | ICD-10-CM | POA: Diagnosis not present

## 2024-01-07 DIAGNOSIS — I119 Hypertensive heart disease without heart failure: Secondary | ICD-10-CM | POA: Diagnosis not present

## 2024-01-07 DIAGNOSIS — Z01818 Encounter for other preprocedural examination: Secondary | ICD-10-CM | POA: Insufficient documentation

## 2024-01-07 DIAGNOSIS — I700 Atherosclerosis of aorta: Secondary | ICD-10-CM | POA: Diagnosis not present

## 2024-01-07 DIAGNOSIS — I471 Supraventricular tachycardia, unspecified: Secondary | ICD-10-CM | POA: Diagnosis not present

## 2024-01-07 DIAGNOSIS — Q2112 Patent foramen ovale: Secondary | ICD-10-CM | POA: Diagnosis not present

## 2024-01-07 DIAGNOSIS — I4510 Unspecified right bundle-branch block: Secondary | ICD-10-CM | POA: Insufficient documentation

## 2024-01-07 DIAGNOSIS — D259 Leiomyoma of uterus, unspecified: Secondary | ICD-10-CM | POA: Diagnosis not present

## 2024-01-07 HISTORY — DX: Cardiac arrhythmia, unspecified: I49.9

## 2024-01-07 LAB — CBC
HCT: 42.1 % (ref 36.0–46.0)
Hemoglobin: 13.6 g/dL (ref 12.0–15.0)
MCH: 29.3 pg (ref 26.0–34.0)
MCHC: 32.3 g/dL (ref 30.0–36.0)
MCV: 90.7 fL (ref 80.0–100.0)
Platelets: 218 K/uL (ref 150–400)
RBC: 4.64 MIL/uL (ref 3.87–5.11)
RDW: 13.4 % (ref 11.5–15.5)
WBC: 7.8 K/uL (ref 4.0–10.5)
nRBC: 0 % (ref 0.0–0.2)

## 2024-01-07 LAB — COMPREHENSIVE METABOLIC PANEL WITH GFR
ALT: 36 U/L (ref 0–44)
AST: 34 U/L (ref 15–41)
Albumin: 3.9 g/dL (ref 3.5–5.0)
Alkaline Phosphatase: 113 U/L (ref 38–126)
Anion gap: 9 (ref 5–15)
BUN: 17 mg/dL (ref 8–23)
CO2: 27 mmol/L (ref 22–32)
Calcium: 9.6 mg/dL (ref 8.9–10.3)
Chloride: 104 mmol/L (ref 98–111)
Creatinine, Ser: 0.74 mg/dL (ref 0.44–1.00)
GFR, Estimated: >60 mL/min (ref >60)
Glucose, Bld: 94 mg/dL (ref 70–99)
Potassium: 4.6 mmol/L (ref 3.5–5.1)
Sodium: 140 mmol/L (ref 135–145)
Total Bilirubin: 1 mg/dL (ref 0.0–1.2)
Total Protein: 7 g/dL (ref 6.5–8.1)

## 2024-01-07 NOTE — Progress Notes (Signed)
 PCP - Dr. Apolinar [place] Cardiologist - Dr. Alm Jewett, LOV 12/04/2023  Had a Zio monitor with 3 SVT events. Patient was asymptomatic.  Did not start beta blocker at this time  PPM/ICD - denies Device Orders - na Rep Notified - na  Chest x-ray - na EKG - 06/19/2023 Stress Test - 05/16/2020 - CE ECHO - 05/16/2020 Cardiac Cath -   Sleep Study - OSA with nightly CPAP  Non-diabetic  Last dose of GLP1 agonist-  denies GLP1 instructions: na  Blood Thinner Instructions: denies Aspirin Instructions:denies  ERAS Protcol - Clears until 0530  Anesthesia review: Yes. HTN, OSA, recent Zio monitor  Patient denies shortness of breath, fever, cough and chest pain at PAT appointment   All instructions explained to the patient, with a verbal understanding of the material. Patient agrees to go over the instructions while at home for a better understanding. Patient also instructed to self quarantine after being tested for COVID-19. The opportunity to ask questions was provided.

## 2024-01-07 NOTE — Telephone Encounter (Signed)
 I agree with plan

## 2024-01-08 ENCOUNTER — Encounter (HOSPITAL_COMMUNITY): Payer: Self-pay

## 2024-01-08 NOTE — Progress Notes (Signed)
 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  She has 2 polyps in the gallbladder, 1 of which may be getting slightly larger.  Due to risk of malignancy cholecystectomy recommended.  History includes never smoker, HTN, HLD, OSA, palpitations, atrial septal aneurysm with small PFO, fatty liver, skin cancer, fibromyalgia, hypothyroidism, breast cancer (bilateral lumpectomies 07/26/2020, left DCIS, right IDC & DCIS + 1/2 right axillary LN, s/p chemoradiation), skin cancer (BCC, SCC; s/p excision).  She is being evaluated by Ortho for right shoulder rotator cuff tear.   Last cardiology evaluation with Dr. Annell was on 12/04/23. She was under a bit of stress. She reported some palpitations, rare lightheadedness, no syncope.  Due to sensitivities to numerous medications no medication changes were made and was continued on amlodipine 5 mg daily.  14-day Zio patch ordered, otherwise 1 year follow-up planned. Monitor results showed primarily sinus rhythm with 4 SVT runs with the longest lasting 16 beats for which patient was asymptomatic.  She had 3 other short runs that were symptomatic but since infrequent no plans to start beta-blocker. Of note, she was diagnosed with atrial septal aneurysm without definite PFO on 10/20/2011 TTE and was evaluated by [facility] structural heart cardiologist Wonda Sharper, MD. He wrote, I have personally reviewed the patient's echo images. 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 There has not definitive evidence of a PFO. Regardless of whether a PFO exists, the patient would not meet criteria for consideration of transcatheter  closure is she has no personal history of TIA or stroke.... He did recommend daily ASA 81 mg. Since TEE or agitated saline study would not change management, he did not recommend further studies at that time. Since then she had another TTE in 2019 that showed a small PFO with right to left flow at rest on saline microcavitation study. Dr. Vina Gull noted, no new recommendations then.  She had a negative stress echo in 2022.  She is on low dose naltrexone 2 mg BID (off-label for inflammation). I called and spoke with her and advised to hold 3 days prior to surgery if no concerns by the proscribing provider (Dr. Roetta). Rationale discussed. We also discussed holding herbal supplements until after surgery.   Anesthesia team to evaluate on the day of surgery. She reported sensitivity to anesthesia medications with her breast surgery (prolonged emergence).   VS: BP 139/79   Pulse 74   Temp 36.9 C   Resp 17   Ht 5' 5 (1.651 m)   Wt 98.4 kg   LMP  (LMP Unknown)   SpO2 99%   BMI 36.11 kg/m    PROVIDERS: Panosh, Apolinar POUR, MD is PCP  Annell Cotta, MD is cardiologist ([facility]) Roetta Blunt, MD is [facility] provider   LABS: Labs reviewed: Acceptable for surgery. (all labs ordered are listed, but only abnormal results are displayed)  Labs Reviewed  COMPREHENSIVE METABOLIC PANEL WITH GFR  CBC     IMAGES: US  Abd 11/23/2023: IMPRESSION: 1. No acute sonographic abnormality on this complete abdominal ultrasound. 2. In the lower gallbladder body, there are 2 rounded echogenicities measuring 5 mm and 10 mm, respectively, likely reflecting adherent  biliary sludge or gallbladder polyps. Follow-up is recommended, as documented below.   Polyp measuring equal to or greater than 1.0 cm: -Increased risk of malignancy. Surgical consultation for further management and possible cholecystectomy.   RECOMMENDATIONS:As per the 2021 joint European Society  guidelines, suggested management of gallbladder polyp(s) are as follows: *NB: Risk factors for gallbladder malignancy include: > 42 years old, history of underlying primary sclerosing cholangitis, Asian ethnicity, sessile polyp (including focal wall thickening > 0.4 cm).    CT Abd/pelvis 06/19/2023: IMPRESSION: 1.  No acute process or explanation for abdominal pain. 2. 6 mL soft tissue density in the nondependent gallbladder likely represents a polyp when correlated with 05/21/2023 ultrasound. Please see that report. 3. Small uterine fibroid 4.  Aortic Atherosclerosis (ICD10-I70.0). 5. 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    EKG: 06/19/2023: Normal sinus rhythm Right axis deviation Incomplete right bundle branch block Nonspecific T wave abnormality Confirmed by Bernard Drivers ([id number]) on 06/19/2023 12:45:33 PM   CV: Holter Monitor 8-14 days 12/04/2023 ([facility]): FINAL PROVIDER INTERPRETATION:  Short runs of SVT were present some associated with symptoms   PRELIMINARY FINDINGS:  Patient had a min HR of 47 bpm, max HR of 190 bpm, and avg HR of 74 bpm. Predominant underlying rhythm was Sinus Rhythm. 4 Supraventricular Tachycardia runs occurred, the run with the fastest interval lasting 5 beats with a max rate of 190 bpm, the longest lasting 16 beats with an avg rate of 143 bpm. Supraventricular Tachycardia was detected within +/- 45 seconds of symptomatic patient event(s). Isolated SVEs were rare (<1.0%), SVE Couplets were rare (<1.0%), and SVE Triplets were rare (<1.0%). Isolated VEs were rare (<1.0%), and no VE Couplets or VE Triplets were present.    Stress Echo 05/16/2020 ([facility]): SUMMARY  The patient had no chest pain.  The patient achieved 111 % of maximum predicted heart rate.  The METS achieved was 7.  Exercise capacity was average.  Normal left ventricular function and global wall motion with stress.  Negative exercise echocardiography for inducible ischemia at target   heart rate.  Negative stress ECG for inducible ischemia at target heart rate.    Echo 07/20/2017: Study Conclusions  - Left ventricle: The cavity size was normal. Wall thickness was    increased in a pattern of mild LVH. Systolic function was normal.    The estimated ejection fraction was in the range of 60% to 65%.    Wall motion was normal; there were no regional wall motion    abnormalities. Doppler parameters are consistent with abnormal    left ventricular relaxation (grade 1 diastolic dysfunction).  - Aortic valve: Transvalvular velocity was within the normal range.    There was no stenosis. There was no regurgitation.  - Mitral valve: Transvalvular velocity was within the normal range.    There was no evidence for stenosis. There was trivial    regurgitation.  - Right ventricle: The cavity size was normal. Wall thickness was    normal. Systolic function was normal.  - Atrial septum: There was a patent foramen ovale with right to    left flow at rest on saline microcavitation study.  - Tricuspid valve: There was trivial regurgitation.  - Pulmonary arteries: Systolic pressure was within the normal    range. PA peak pressure: 33 mm Hg (S).    US  Carotid 09/15/2011:  Impressions: Normal carotid arteries bilaterally.  Patent vertebral arteries with antegrade flow.   Past Medical History:  Diagnosis Date  Anal fissure    Atrial septal aneurysm / if pfo  echo 6 13  10/23/2011   Breast cancer (HCC)    Cataract    both eyes   Depression    Dysrhythmia    Palpitations   Fatty liver    pre fatty liver   Fibromyalgia    Headache(784.0)    hx of migraines when younger   Heart palpitations    hx with normal holter event monitoring   Hx: UTI (urinary tract infection)    Hyperlipidemia    Hypertension    Hypothyroidism    Obesity    Personal history of chemotherapy    Personal history of radiation therapy    PFO (patent foramen ovale) 07/20/2017   PFO with right to  left flow at rest on saline microcavitation study   Pneumonia 1972   hx of   Polyp of colon    Serrated adenoma of colon 08/2012   Skin cancer    basal, squamous cell   Sleep apnea    uses cpap    Past Surgical History:  Procedure Laterality Date   BREAST LUMPECTOMY     BREAST LUMPECTOMY WITH RADIOACTIVE SEED AND SENTINEL LYMPH NODE BIOPSY Right 07/26/2020   Procedure: RIGHT BREAST LUMPECTOMY WITH RADIOACTIVE SEED AND SENTINEL LYMPH NODE BIOPSY;  Surgeon: Curvin Deward MOULD, MD;  Location: [facility];  Service: General;  Laterality: Right;   BREAST LUMPECTOMY WITH RADIOACTIVE SEED LOCALIZATION Left 07/26/2020   Procedure: LEFT BREAST LUMPECTOMY X 2  WITH RADIOACTIVE SEED LOCALIZATION;  Surgeon: Curvin Deward MOULD, MD;  Location: [facility];  Service: General;  Laterality: Left;   COLONOSCOPY     DILATION AND CURETTAGE OF UTERUS  1978   DRUG INDUCED ENDOSCOPY     ECTOPIC PREGNANCY SURGERY  1983   KNEE ARTHROSCOPY     both in past   POLYPECTOMY     SHOULDER OPEN ROTATOR CUFF REPAIR Left 07/07/2013   Procedure: LEFT SHOULDER MINI OPEN SUBACROMIAL DECOMPRESSION ROTATOR CUFF REPAIR AND POSSIBLE PATCH GRAFT ;  Surgeon: Reyes JAYSON Billing, MD;  Location: [facility];  Service: Orthopedics;  Laterality: Left;  with interscaline block   SKIN CANCER EXCISION Bilateral    arm, legs, and chest   TONSILLECTOMY      MEDICATIONS:  ALOE VERA JUICE PO   amLODipine (NORVASC) 10 MG tablet   Ascorbic Acid  (VITAMIN C ) 1000 MG tablet   b complex vitamins capsule   colchicine 0.6 MG tablet   Digestive Aids Mixture (BILE ACID FACTORS PO)   MAGNESIUM  CITRATE PO   melatonin 5 MG TABS   Multiple Vitamin (MULTIVITAMIN) tablet   NALTREXONE HCL PO   NON FORMULARY   Omega-3 Fatty Acids (OMEGA 3 PO)   OVER THE COUNTER MEDICATION   OVER THE COUNTER MEDICATION   OVER THE COUNTER MEDICATION   pantoprazole  (PROTONIX ) 40 MG tablet   potassium chloride  SA (KLOR-CON  M) 20 MEQ tablet   Probiotic Product (PROBIOTIC PO)   thyroid   (ARMOUR THYROID ) 90 MG tablet   VITAMIN D -VITAMIN K PO   ZINC GLUCONATE PO   No current facility-administered medications for this encounter.    Isaiah Ruder, PA-C Surgical Short Stay/Anesthesiology [facility] Phone [phone] [facility] Phone [phone] 01/08/2024 6:13 PM

## 2024-01-08 NOTE — Anesthesia Preprocedure Evaluation (Addendum)
 Anesthesia Evaluation  Patient identified by MRN, date of birth, ID band Patient awake    Reviewed: Allergy & Precautions, H&P , NPO status , Patient's Chart, lab work & pertinent test results  History of Anesthesia Complications Negative for: history of anesthetic complications  Airway Mallampati: II  TM Distance: >3 FB Neck ROM: Full    Dental no notable dental hx.    Pulmonary sleep apnea    Pulmonary exam normal breath sounds clear to auscultation       Cardiovascular hypertension, Normal cardiovascular exam Rhythm:Regular Rate:Normal     Neuro/Psych  Headaches PSYCHIATRIC DISORDERS Anxiety Depression       GI/Hepatic Neg liver ROS,GERD  ,,  Endo/Other  negative endocrine ROS    Renal/GU negative Renal ROS  negative genitourinary   Musculoskeletal  (+)  Fibromyalgia -  Abdominal   Peds negative pediatric ROS (+)  Hematology negative hematology ROS (+)   Anesthesia Other Findings   Reproductive/Obstetrics negative OB ROS                              Anesthesia Physical Anesthesia Plan  ASA: 3  Anesthesia Plan: General   Post-op Pain Management: Ofirmev  IV (intra-op)*   Induction: Intravenous  PONV Risk Score and Plan: 3 and Ondansetron , Dexamethasone  and Treatment may vary due to age or medical condition  Airway Management Planned: Oral ETT  Additional Equipment: None  Intra-op Plan:   Post-operative Plan: Extubation in OR  Informed Consent: I have reviewed the patients History and Physical, chart, labs and discussed the procedure including the risks, benefits and alternatives for the proposed anesthesia with the patient or authorized representative who has indicated his/her understanding and acceptance.     Dental advisory given  Plan Discussed with: CRNA  Anesthesia Plan Comments: (PAT note written 01/08/2024 by Allison Zelenak, PA-C. Reported prolonged emergence  with last surgery.    Patient is a 68 year old female scheduled for the above procedure.  She has 2 polyps in the gallbladder, 1 of which may be getting slightly larger.  Due to risk of malignancy cholecystectomy recommended.   History includes never smoker, HTN, HLD, OSA, palpitations, atrial septal aneurysm with small PFO, fatty liver, skin cancer, fibromyalgia, hypothyroidism, breast cancer (bilateral lumpectomies 07/26/2020, left DCIS, right IDC & DCIS + 1/2 right axillary LN, s/p chemoradiation), skin cancer (BCC, SCC; s/p excision).   She is being evaluated by Ortho for right shoulder rotator cuff tear.    Last cardiology evaluation with Dr. Annell was on 12/04/23. She was under a bit of stress. She reported some palpitations, rare lightheadedness, no syncope.  Due to sensitivities to numerous medications no medication changes were made and was continued on amlodipine 5 mg daily.  14-day Zio patch ordered, otherwise 1 year follow-up planned. Monitor results showed primarily sinus rhythm with 4 SVT runs with the longest lasting 16 beats for which patient was asymptomatic.  She had 3 other short runs that were symptomatic but since infrequent no plans to start beta-blocker. Of note, she was diagnosed with atrial septal aneurysm without definite PFO on 10/20/2011 TTE and was evaluated by [facility] structural heart cardiologist Wonda Sharper, MD. He wrote, I have personally reviewed the patient's echo images. 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 There has not definitive evidence of a PFO. Regardless of whether a PFO exists, the patient would not meet criteria for consideration of transcatheter closure is she has no personal history of  TIA or stroke.... He did recommend daily ASA 81 mg. Since TEE or agitated saline study would not change management, he did not recommend further studies at that time. Since then she had another TTE in 2019 that showed a small PFO with right to left flow  at rest on saline microcavitation study. Dr. Vina Gull noted, no new recommendations then.  She had a negative stress echo in 2022.   She is on low dose naltrexone 2 mg BID (off-label for inflammation). I called and spoke with her and advised to hold 3 days prior to surgery if no concerns by the proscribing provider (Dr. Roetta). Rationale discussed. We also discussed holding herbal supplements until after surgery.  )         Anesthesia Quick Evaluation

## 2024-01-12 DIAGNOSIS — G5603 Carpal tunnel syndrome, bilateral upper limbs: Secondary | ICD-10-CM | POA: Diagnosis not present

## 2024-01-12 DIAGNOSIS — G603 Idiopathic progressive neuropathy: Secondary | ICD-10-CM | POA: Diagnosis not present

## 2024-01-12 DIAGNOSIS — G5601 Carpal tunnel syndrome, right upper limb: Secondary | ICD-10-CM | POA: Diagnosis not present

## 2024-01-12 DIAGNOSIS — M7918 Myalgia, other site: Secondary | ICD-10-CM | POA: Diagnosis not present

## 2024-01-12 DIAGNOSIS — F419 Anxiety disorder, unspecified: Secondary | ICD-10-CM | POA: Diagnosis not present

## 2024-01-12 DIAGNOSIS — M7912 Myalgia of auxiliary muscles, head and neck: Secondary | ICD-10-CM | POA: Diagnosis not present

## 2024-01-13 ENCOUNTER — Ambulatory Visit (HOSPITAL_COMMUNITY)
Admission: RE | Admit: 2024-01-13 | Discharge: 2024-01-13 | Disposition: A | Discharge status: Home / Self Care | Attending: General Surgery | Admitting: General Surgery

## 2024-01-13 ENCOUNTER — Other Ambulatory Visit: Payer: Self-pay

## 2024-01-13 ENCOUNTER — Encounter (HOSPITAL_COMMUNITY)
Admission: RE | Disposition: A | Discharge status: Home / Self Care | Payer: Self-pay | Source: Home / Self Care | Attending: General Surgery

## 2024-01-13 ENCOUNTER — Ambulatory Visit (HOSPITAL_COMMUNITY): Payer: Self-pay | Admitting: Vascular Surgery

## 2024-01-13 ENCOUNTER — Ambulatory Visit (HOSPITAL_BASED_OUTPATIENT_CLINIC_OR_DEPARTMENT_OTHER): Admitting: Anesthesiology

## 2024-01-13 ENCOUNTER — Encounter (HOSPITAL_COMMUNITY): Payer: Self-pay | Admitting: General Surgery

## 2024-01-13 DIAGNOSIS — Z86000 Personal history of in-situ neoplasm of breast: Secondary | ICD-10-CM | POA: Insufficient documentation

## 2024-01-13 DIAGNOSIS — M797 Fibromyalgia: Secondary | ICD-10-CM | POA: Diagnosis not present

## 2024-01-13 DIAGNOSIS — Z923 Personal history of irradiation: Secondary | ICD-10-CM | POA: Diagnosis not present

## 2024-01-13 DIAGNOSIS — K219 Gastro-esophageal reflux disease without esophagitis: Secondary | ICD-10-CM | POA: Insufficient documentation

## 2024-01-13 DIAGNOSIS — G4730 Sleep apnea, unspecified: Secondary | ICD-10-CM | POA: Insufficient documentation

## 2024-01-13 DIAGNOSIS — E039 Hypothyroidism, unspecified: Secondary | ICD-10-CM | POA: Diagnosis not present

## 2024-01-13 DIAGNOSIS — K824 Cholesterolosis of gallbladder: Secondary | ICD-10-CM | POA: Diagnosis not present

## 2024-01-13 DIAGNOSIS — Z853 Personal history of malignant neoplasm of breast: Secondary | ICD-10-CM | POA: Insufficient documentation

## 2024-01-13 DIAGNOSIS — K8010 Calculus of gallbladder with chronic cholecystitis without obstruction: Secondary | ICD-10-CM | POA: Insufficient documentation

## 2024-01-13 DIAGNOSIS — F418 Other specified anxiety disorders: Secondary | ICD-10-CM | POA: Insufficient documentation

## 2024-01-13 DIAGNOSIS — I10 Essential (primary) hypertension: Secondary | ICD-10-CM | POA: Diagnosis not present

## 2024-01-13 SURGERY — LAPAROSCOPIC CHOLECYSTECTOMY
Anesthesia: General | Site: Abdomen

## 2024-01-13 MED ORDER — 0.9 % SODIUM CHLORIDE (POUR BTL) OPTIME
TOPICAL | Status: DC | PRN
  Administered 2024-01-13: 1000 mL

## 2024-01-13 MED ORDER — LACTATED RINGERS IV SOLN: INTRAVENOUS | Status: DC

## 2024-01-13 MED ORDER — FENTANYL CITRATE (PF) 100 MCG/2ML IJ SOLN
INTRAMUSCULAR | Status: AC
  Filled 2024-01-13: qty 2

## 2024-01-13 MED ORDER — CHLORHEXIDINE GLUCONATE CLOTH 2 % EX PADS: 6.0000 | MEDICATED_PAD | Freq: Once | CUTANEOUS | Status: DC

## 2024-01-13 MED ORDER — BUPIVACAINE-EPINEPHRINE (PF) 0.25% -1:200000 IJ SOLN
INTRAMUSCULAR | Status: AC
  Filled 2024-01-13: qty 30

## 2024-01-13 MED ORDER — FENTANYL CITRATE (PF) 250 MCG/5ML IJ SOLN
INTRAMUSCULAR | Status: AC
  Filled 2024-01-13: qty 5

## 2024-01-13 MED ORDER — FENTANYL CITRATE (PF) 250 MCG/5ML IJ SOLN
INTRAMUSCULAR | Status: DC | PRN
  Administered 2024-01-13: 100 ug via INTRAVENOUS
  Administered 2024-01-13: 50 ug via INTRAVENOUS

## 2024-01-13 MED ORDER — SODIUM CHLORIDE 0.9 % IR SOLN
Status: DC | PRN
  Administered 2024-01-13: 1000 mL

## 2024-01-13 MED ORDER — OXYCODONE HCL 5 MG PO TABS
ORAL_TABLET | ORAL | Status: AC
  Filled 2024-01-13: qty 1

## 2024-01-13 MED ORDER — LIDOCAINE 2% (20 MG/ML) 5 ML SYRINGE
INTRAMUSCULAR | Status: AC
  Filled 2024-01-13: qty 5

## 2024-01-13 MED ORDER — MIDAZOLAM HCL 2 MG/2ML IJ SOLN
INTRAMUSCULAR | Status: AC
  Filled 2024-01-13: qty 2

## 2024-01-13 MED ORDER — DROPERIDOL 2.5 MG/ML IJ SOLN
0.6250 mg | Freq: Once | INTRAMUSCULAR | Status: AC | PRN
  Administered 2024-01-13: 0.625 mg via INTRAVENOUS

## 2024-01-13 MED ORDER — CHLORHEXIDINE GLUCONATE 0.12 % MT SOLN: 15.0000 mL | Freq: Once | OROMUCOSAL | Status: AC

## 2024-01-13 MED ORDER — CIPROFLOXACIN IN D5W 400 MG/200ML IV SOLN
INTRAVENOUS | Status: AC
  Filled 2024-01-13: qty 200

## 2024-01-13 MED ORDER — PROPOFOL 10 MG/ML IV BOLUS
INTRAVENOUS | Status: AC
  Filled 2024-01-13: qty 20

## 2024-01-13 MED ORDER — CIPROFLOXACIN IN D5W 400 MG/200ML IV SOLN
400.0000 mg | INTRAVENOUS | Status: AC
  Administered 2024-01-13: 400 mg via INTRAVENOUS

## 2024-01-13 MED ORDER — ONDANSETRON HCL 4 MG/2ML IJ SOLN
INTRAMUSCULAR | Status: AC
  Filled 2024-01-13: qty 2

## 2024-01-13 MED ORDER — ORAL CARE MOUTH RINSE: 15.0000 mL | Freq: Once | OROMUCOSAL | Status: AC

## 2024-01-13 MED ORDER — DEXAMETHASONE SODIUM PHOSPHATE 10 MG/ML IJ SOLN
INTRAMUSCULAR | Status: DC | PRN
  Administered 2024-01-13: 8 mg via INTRAVENOUS

## 2024-01-13 MED ORDER — CHLORHEXIDINE GLUCONATE 0.12 % MT SOLN
OROMUCOSAL | Status: AC
  Administered 2024-01-13: 15 mL via OROMUCOSAL
  Filled 2024-01-13: qty 15

## 2024-01-13 MED ORDER — LIDOCAINE 2% (20 MG/ML) 5 ML SYRINGE
INTRAMUSCULAR | Status: DC | PRN
  Administered 2024-01-13: 60 mg via INTRAVENOUS

## 2024-01-13 MED ORDER — DEXAMETHASONE SODIUM PHOSPHATE 10 MG/ML IJ SOLN
INTRAMUSCULAR | Status: AC
  Filled 2024-01-13: qty 1

## 2024-01-13 MED ORDER — OXYCODONE HCL 5 MG/5ML PO SOLN: 5.0000 mg | Freq: Once | ORAL | Status: AC | PRN

## 2024-01-13 MED ORDER — OXYCODONE HCL 5 MG PO TABS: 5.0000 mg | ORAL_TABLET | Freq: Four times a day (QID) | ORAL | 0 refills | Status: AC | PRN

## 2024-01-13 MED ORDER — FENTANYL CITRATE (PF) 100 MCG/2ML IJ SOLN
25.0000 ug | INTRAMUSCULAR | Status: DC | PRN
  Administered 2024-01-13 (×2): 50 ug via INTRAVENOUS

## 2024-01-13 MED ORDER — BUPIVACAINE-EPINEPHRINE 0.25% -1:200000 IJ SOLN
INTRAMUSCULAR | Status: DC | PRN
  Administered 2024-01-13: 24 mL

## 2024-01-13 MED ORDER — ROCURONIUM BROMIDE 10 MG/ML (PF) SYRINGE
PREFILLED_SYRINGE | INTRAVENOUS | Status: DC | PRN
  Administered 2024-01-13: 60 mg via INTRAVENOUS

## 2024-01-13 MED ORDER — PROPOFOL 10 MG/ML IV BOLUS
INTRAVENOUS | Status: DC | PRN
  Administered 2024-01-13: 100 mg via INTRAVENOUS

## 2024-01-13 MED ORDER — DROPERIDOL 2.5 MG/ML IJ SOLN
INTRAMUSCULAR | Status: AC
  Filled 2024-01-13: qty 2

## 2024-01-13 MED ORDER — ROCURONIUM BROMIDE 10 MG/ML (PF) SYRINGE
PREFILLED_SYRINGE | INTRAVENOUS | Status: AC
  Filled 2024-01-13: qty 10

## 2024-01-13 MED ORDER — OXYCODONE HCL 5 MG PO TABS
5.0000 mg | ORAL_TABLET | Freq: Once | ORAL | Status: AC | PRN
  Administered 2024-01-13: 5 mg via ORAL

## 2024-01-13 MED ORDER — ONDANSETRON HCL 4 MG/2ML IJ SOLN
INTRAMUSCULAR | Status: DC | PRN
  Administered 2024-01-13: 4 mg via INTRAVENOUS

## 2024-01-13 MED ORDER — SUGAMMADEX SODIUM 200 MG/2ML IV SOLN
INTRAVENOUS | Status: DC | PRN
  Administered 2024-01-13: 200 mg via INTRAVENOUS

## 2024-01-13 SURGICAL SUPPLY — 27 items
BAG COUNTER SPONGE SURGICOUNT (BAG) ×1 IMPLANT
CANISTER SUCTION 3000ML PPV (SUCTIONS) ×1 IMPLANT
CHLORAPREP W/TINT 26 (MISCELLANEOUS) ×1 IMPLANT
CLIP APPLIE 5 13 M/L LIGAMAX5 (MISCELLANEOUS) ×1 IMPLANT
COVER SURGICAL LIGHT HANDLE (MISCELLANEOUS) ×1 IMPLANT
DERMABOND ADVANCED .7 DNX12 (GAUZE/BANDAGES/DRESSINGS) ×1 IMPLANT
ELECTRODE REM PT RTRN 9FT ADLT (ELECTROSURGICAL) ×1 IMPLANT
GLOVE BIO SURGEON STRL SZ7.5 (GLOVE) ×1 IMPLANT
GOWN STRL REUS W/ TWL LRG LVL3 (GOWN DISPOSABLE) ×3 IMPLANT
IRRIGATION SUCT STRKRFLW 2 WTP (MISCELLANEOUS) ×1 IMPLANT
KIT BASIN OR (CUSTOM PROCEDURE TRAY) ×1 IMPLANT
KIT TURNOVER KIT B (KITS) ×1 IMPLANT
NS IRRIG 1000ML POUR BTL (IV SOLUTION) ×1 IMPLANT
PAD ARMBOARD POSITIONER FOAM (MISCELLANEOUS) ×1 IMPLANT
SCISSORS LAP 5X35 DISP (ENDOMECHANICALS) ×1 IMPLANT
SET TUBE SMOKE EVAC HIGH FLOW (TUBING) ×1 IMPLANT
SLEEVE Z-THREAD 5X100MM (TROCAR) ×2 IMPLANT
SPECIMEN JAR SMALL (MISCELLANEOUS) ×1 IMPLANT
SUT MNCRL AB 4-0 PS2 18 (SUTURE) ×1 IMPLANT
SYSTEM BAG RETRIEVAL 10MM (BASKET) ×2 IMPLANT
TOWEL GREEN STERILE (TOWEL DISPOSABLE) ×1 IMPLANT
TOWEL GREEN STERILE FF (TOWEL DISPOSABLE) ×1 IMPLANT
TRAY LAPAROSCOPIC MC (CUSTOM PROCEDURE TRAY) ×1 IMPLANT
TROCAR BALLN 12MMX100 BLUNT (TROCAR) ×1 IMPLANT
TROCAR Z-THREAD OPTICAL 5X100M (TROCAR) ×1 IMPLANT
WARMER LAPAROSCOPE (MISCELLANEOUS) ×1 IMPLANT
WATER STERILE IRR 1000ML POUR (IV SOLUTION) ×1 IMPLANT

## 2024-01-13 NOTE — Op Note (Signed)
 01/13/2024  9:21 AM  PATIENT:  [name]  68 y.o. female  PRE-OPERATIVE DIAGNOSIS:  GALLBLADDER POLYP  POST-OPERATIVE DIAGNOSIS:  GALLBLADDER POLYP  PROCEDURE:  Procedure(s): LAPAROSCOPIC CHOLECYSTECTOMY (N/A)  SURGEON:  Surgeons and Role:    * Curvin Deward MOULD, MD - Primary  PHYSICIAN ASSISTANT:   ASSISTANTS: Keven Piety, RNFA   ANESTHESIA:   local and general  EBL:  minimal   BLOOD ADMINISTERED:none  DRAINS: none   LOCAL MEDICATIONS USED:  MARCAINE      SPECIMEN:  Source of Specimen:  gallbladder  DISPOSITION OF SPECIMEN:  PATHOLOGY  COUNTS:  YES  TOURNIQUET:  * No tourniquets in log *  DICTATION: .Dragon Dictation    Procedure: After informed consent was obtained the patient was brought to the operating room and placed in the supine position on the operating room table. After adequate induction of general anesthesia the patient's abdomen was prepped with ChloraPrep allowed to dry and draped in usual sterile manner. An appropriate timeout was performed. The area above the umbilicus was infiltrated with quarter percent  Marcaine . A small incision was made with a 15 blade knife. The incision was carried down through the subcutaneous tissue bluntly with a hemostat and [employer] retractors. The linea alba was identified. The linea alba was incised with a 15 blade knife and each side was grasped with Coker clamps. The preperitoneal space was then probed with a hemostat until the peritoneum was opened and access was gained to the abdominal cavity. A 0 Vicryl pursestring stitch was placed in the fascia surrounding the opening. A Hassan cannula was then placed through the opening and anchored in place with the previously placed Vicryl purse string stitch. The abdomen was insufflated with carbon dioxide without difficulty. A laparoscope was inserted through the [facility] cannula in the right upper quadrant was inspected. Next the epigastric region was infiltrated with %  Marcaine . A small incision was made with a 15 blade knife. A 5 mm port was placed bluntly through this incision into the abdominal cavity under direct vision. Next 2 sites were chosen laterally on the right side of the abdomen for placement of 5 mm ports. Each of these areas was infiltrated with quarter percent Marcaine . Small stab incisions were made with a 15 blade knife. 5 mm ports were then placed bluntly through these incisions into the abdominal cavity under direct vision without difficulty. A blunt grasper was placed through the lateralmost 5 mm port and used to grasp the dome of the gallbladder and elevate it anteriorly and superiorly. Another blunt grasper was placed through the other 5 mm port and used to retract the body and neck of the gallbladder. A dissector was placed through the epigastric port and using the electrocautery the peritoneal reflection at the gallbladder neck was opened. Blunt dissection was then carried out in this area until the gallbladder neck-cystic duct junction was readily identified and a goodcritical window was created. 2 clips were placed proximally on the cystic duct and one distally and the duct was divided between the 2 sets of clips. Posterior to this the cystic artery was identified and again dissected bluntly in a circumferential manner until a good window  was created. 2 clips were placed proximally and one distally on the artery and the artery was divided between the 2 sets of clips. Next a laparoscopic hook cautery device was used to separate the gallbladder from the liver bed. Prior to completely detaching the gallbladder from the liver bed the liver bed was  inspected and several small bleeding points were coagulated with the electrocautery until the area was completely hemostatic. The gallbladder was then detached the rest of it from the liver bed without difficulty. A laparoscopic bag was inserted through the hassan port. The laparoscope was moved to the epigastric  port. The gallbladder was placed within the bag and the bag was sealed.  The bag with the gallbladder was then removed with the [facility] cannula through the infraumbilical port without difficulty. The fascial defect was then closed with the previously placed Vicryl pursestring stitch as well as with another figure-of-eight 0 Vicryl stitch. The liver bed was inspected again and found to be hemostatic. The abdomen was irrigated with copious amounts of saline until the effluent was clear. The ports were then removed under direct vision without difficulty and were found to be hemostatic. The gas was allowed to escape. No other abnormalities were noted on general inspection of the abdomen. The skin incisions were all closed with interrupted 4-0 Monocryl subcuticular stitches. Dermabond dressings were applied. The patient tolerated the procedure well. At the end of the case all needle sponge and instrument counts were correct. The patient was then awakened and taken to recovery in stable condition  PLAN OF CARE: Discharge to home after PACU  PATIENT DISPOSITION:  PACU - hemodynamically stable.   Delay start of Pharmacological VTE agent (>24hrs) due to surgical blood loss or risk of bleeding: not applicable

## 2024-01-13 NOTE — Anesthesia Postprocedure Evaluation (Signed)
 Anesthesia Post Note  Patient: [name]  Procedure(s) Performed: LAPAROSCOPIC CHOLECYSTECTOMY (Abdomen)     Patient location during evaluation: PACU Anesthesia Type: General Level of consciousness: awake and alert Pain management: pain level controlled Vital Signs Assessment: post-procedure vital signs reviewed and stable Respiratory status: spontaneous breathing, nonlabored ventilation, respiratory function stable and patient connected to nasal cannula oxygen Cardiovascular status: blood pressure returned to baseline and stable Postop Assessment: no apparent nausea or vomiting Anesthetic complications: no   No notable events documented.  Last Vitals:  Vitals:   01/13/24 1045 01/13/24 1100  BP: (!) 133/59   Pulse: (!) 50   Resp: 14   Temp:  36.4 C  SpO2: 94%     Last Pain:  Vitals:   01/13/24 1100  TempSrc:   PainSc: 4                  Thom JONELLE Peoples

## 2024-01-13 NOTE — Transfer of Care (Signed)
 Immediate Anesthesia Transfer of Care Note  Patient: [name]  Procedure(s) Performed: LAPAROSCOPIC CHOLECYSTECTOMY (Abdomen)  Patient Location: PACU  Anesthesia Type:General  Level of Consciousness: awake and drowsy  Airway & Oxygen Therapy: Patient Spontanous Breathing  Post-op Assessment: Report given to RN  Post vital signs: Reviewed and stable  Last Vitals:  Vitals Value Taken Time  BP 162/65 01/13/24 09:38  Temp 36.3 C 01/13/24 09:38  Pulse 59 01/13/24 09:42  Resp 11 01/13/24 09:42  SpO2 96 % 01/13/24 09:42  Vitals shown include unfiled device data.  Last Pain:  Vitals:   01/13/24 0705  TempSrc:   PainSc: 0-No pain         Complications: No notable events documented.

## 2024-01-13 NOTE — Anesthesia Procedure Notes (Signed)
 Procedure Name: Intubation Date/Time: 01/13/2024 8:36 AM  Performed by: Delores Duwaine SAUNDERS, CRNAPre-anesthesia Checklist: Patient identified, Emergency Drugs available, Suction available and Patient being monitored Patient Re-evaluated:Patient Re-evaluated prior to induction Oxygen Delivery Method: Circle System Utilized Preoxygenation: Pre-oxygenation with 100% oxygen Induction Type: IV induction Ventilation: Mask ventilation without difficulty Laryngoscope Size: Mac and 3 Grade View: Grade I Tube type: Oral Tube size: 7.0 mm Number of attempts: 1 Airway Equipment and Method: Stylet and Oral airway Placement Confirmation: ETT inserted through vocal cords under direct vision, positive ETCO2 and breath sounds checked- equal and bilateral Secured at: 20 cm Tube secured with: Tape Dental Injury: Teeth and Oropharynx as per pre-operative assessment

## 2024-01-13 NOTE — H&P (Signed)
 MRN: [id number] DOB: 1955/07/09 Subjective   Chief Complaint: New Problem (Eval of GB)   History of Present Illness: [name] is a 68 y.o. female who is seen today for bilateral breast cancer. 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 She was treated with radiation therapy. She could not tolerate the exemestane  because of the fatigue. She presents today with intermittent pain in the left upper quadrant of her abdomen. She underwent endoscopy which was negative. She was instructed to start acid reducers but never started. She also underwent ultrasound and CT scan. She was found to have 2 small polyps in the gallbladder which may have gotten slightly larger over the last 6 months. Because of the risk for malignancy she was sent for consideration of removal of the gallbladder. Her liver functions were normal  Review of Systems: A complete review of systems was obtained from the patient. I have reviewed this information and discussed as appropriate with the patient. See HPI as well for other ROS.  ROS   Medical History: Past Medical History:  Diagnosis Date  History of cancer  Hypertension  Liver disease  Thyroid  disease   Patient Active Problem List  Diagnosis  Ductal carcinoma in situ (DCIS) of left breast  Malignant neoplasm of upper-outer quadrant of right breast in female, estrogen receptor positive (CMS/HHS-HCC)  Gallbladder polyp   Past Surgical History:  Procedure Laterality Date  2 knee surgery  ARTHROSCOPIC ROTATOR CUFF REPAIR  LAPAROSCOPIC REMOVAL ECTOPIC PREGNANCY  MASTECTOMY PARTIAL / LUMPECTOMY    Allergies  Allergen Reactions  Cefdinir Other (See Comments), Itching, Rash and Swelling  Other reaction(s): Edema REACTION:  Rash, swelling and itching REACTION: Rash, swelling and itching REACTION: Rash, swelling and itching REACTION: Rash, swelling and itching  Docetaxel  Anaphylaxis  08/30/20 Constricted throat sensation of throat and tongue swelling and chest pressure Docetaxel  paused and given Pepcid  20 mg IV, Benadryl  25 mg IV, Ativan  0.5 mg IV x1. Ativan  0.5 mg IV x1 was repeated after the restart of the patient's chemotherapy. The patient's symptoms abated and docetaxel  restarted. Then had a recurrence of constriction of throat and was given Ativan  0.5 mg IV x1 for total dose of 1 mg of Ativan . She was able to complete her treatment with out any further issues or concerns. 08/30/20 Constricted throat sensation of throat and tongue swelling and chest pressure Docetaxel  paused and given Pepcid  20 mg IV, Benadryl  25 mg IV, Ativan  0.5 mg IV x1. Ativan  0.5 mg IV x1 was repeated after the restart of the patient's chemotherapy. The patient's symptoms abated and docetaxel  restarted. Then had a recurrence of constriction of throat and was given Ativan  0.5 mg IV x1 for total dose of 1 mg of Ativan . She was able to complete her treatment with out any further issues or concerns. 08/30/20 Constricted throat sensation of throat and tongue swelling and chest pressure Docetaxel  paused and given Pepcid  20 mg IV, Benadryl  25 mg IV, Ativan  0.5 mg IV x1. Ativan  0.5 mg IV x1 was repeated after the restart of the patient's chemotherapy. The patient's symptoms abated and docetaxel  restarted. Then had a recurrence of constriction of throat and was given Ativan  0.5 mg IV x1 for total dose of 1 mg of Ativan . She was able  to complete her treatment with out any further issues or concerns. 08/30/20 Constricted throat sensation of throat and tongue swelling and chest pressure Docetaxel  paused and given Pepcid  20 mg IV, Benadryl  25 mg IV, Ativan  0.5 mg IV x1. Ativan  0.5 mg IV x1 was repeated after the restart of the patient's chemotherapy. The  patient's symptoms abated and docetaxel  restarted. Then had a recurrence of constriction of throat and was given Ativan  0.5 mg IV x1 for total dose of 1 mg of Ativan . She was able to complete her treatment with out any further issues or concerns.  Mold Anaphylaxis  Mold and mildew Mold and mildew Mold and mildew Mold and mildew  Pegfilgrastim  Anaphylaxis  Bad reaction- unspecified  Bad reaction- unspecified  Bad reaction- unspecified  Bad reaction- unspecified   Penicillins Other (See Comments), Itching and Swelling  REACTION: swelling up as a child REACTION: swelling up as a child REACTION: swelling up as a child  Amlodipine Besylate Other (See Comments)  Other reaction(s): Other (See Comments) Fatigue and dizziness Fatigue and dizziness Fatigue and dizziness Fatigue and dizziness  Acetaminophen  Other (See Comments)  Pre fatty liver: does not takes these Pre fatty liver: does not takes these Pre fatty liver: does not takes these Pre fatty liver: does not takes these  Cetirizine Other (See Comments)  Other reaction(s): Other (See Comments) unsure unsure  Irbesartan  Other (See Comments)  Other reaction(s): Other (See Comments) See 1 20 note Heart burn , dizziness when taken with diuretic  See 1 20 note Heart burn , dizziness when taken with diuretic  See 1 20 note Heart burn , dizziness when taken with diuretic  See 1 20 note Heart burn , dizziness when taken with diuretic  See 1 20 note Heart burn , dizziness when taken with diuretic  See 1 20 note Heart burn , dizziness when taken with diuretic    Current Outpatient Medications on File Prior to Visit  Medication Sig Dispense Refill  ascorbic acid /hesperidin (BIOFLAVONOID PRODUCTS ORAL) Take 1 capsule by mouth once daily  b complex vitamins capsule Take 1 capsule by mouth once daily  cholecalciferol  (VITAMIN D3) 2,000 unit tablet Take by mouth  coQ10, ubiquinol, 200 mg Cap Take by mouth  diclofenac (VOLTAREN) 1 %  topical gel diclofenac 1 % topical gel APPLY 2 GRAMS TO THE AFFECTED AREA(S) BY TOPICAL ROUTE 4 TIMES PER DAY  IODINE ORAL Take by mouth once daily  lidocaine -prilocaine  (EMLA ) cream lidocaine -prilocaine  2.5 %-2.5 % topical cream APPLY TO AFFECTED AREA ONCE AS DIRECTED  magnesium  carbonate, bulk, Powd Take 1 tablet by mouth once daily  melatonin 5 mg Tab Take by mouth  multivitamin (MULTIVITAMIN) tablet Take 1 tablet by mouth once daily  thyroid , pork, 113.75 mg Tab Nature Thyroid   vitamin K2 100 mcg Cap Take by mouth  ascorbic acid , vitamin C , (VITAMIN C ) 1000 MG tablet Take by mouth  azithromycin (ZITHROMAX) 250 MG tablet azithromycin 250 mg tablet TAKE 2 TABLETS BY MOUTH ON DAY 1, THEN TAKE 1 TABLET DAILY FOR DAYS 2 THRU 5  COMPRO  25 mg suppository INSERT 1 SUPPOSITORY RECTALLY EVERY 12 HOURS AS NEEDED FOR NAUSEA FOR UP TO 7 DAYS  dalteparin (FRAGMIN) 2,500 anti-Xa unit/0.2 mL injection Inject subcutaneously  dexAMETHasone  (DECADRON ) 4 MG tablet dexamethasone  4 mg tablet PLEASE SEE ATTACHED FOR DETAILED DIRECTIONS  diazePAM (VALIUM) 5 MG tablet Take one tablet 30 minutes before scan and one at time of scan if needed.  exemestane  (AROMASIN ) 25 mg tablet Take by mouth  hydroCHLOROthiazide  (HYDRODIURIL ) 25 MG tablet hydrochlorothiazide  25 mg tablet TAKE 1 TABLET BY MOUTH EVERY DAY  hydrOXYchloroQUINE (PLAQUENIL) 200 mg tablet Take by mouth  metoprolol  succinate (TOPROL -XL) 25 MG XL tablet Take by mouth at bedtime as needed  ondansetron  (ZOFRAN ) 8 MG tablet ondansetron  HCl 8 mg tablet PLEASE SEE ATTACHED FOR DETAILED DIRECTIONS  ondansetron  (ZOFRAN -ODT) 4 MG disintegrating tablet ondansetron  4 mg disintegrating tablet TAKE 1 TABLET (4 MG TOTAL) BY MOUTH EVERY EIGHT (8) HOURS AS NEEDED FOR NAUSEA FOR UP TO 7 DAYS.  potassium chloride  (KLOR-CON ) 20 MEQ ER tablet potassium chloride  ER 20 mEq tablet,extended release(part/cryst)  prasterone, dhea,-calcium carb (DHEA) 10 mg-47 mg calcium Tab  dhea 8 mg capsule TAKE ONE CAPSULE BY MOUTH EVERY DAY  prasterone, dhea,-calcium carb (DHEA) 10 mg-47 mg calcium Tab dhea sr 3mg  caps TAKE ONE CAPSULE BY MOUTH EVERY MORNING  prochlorperazine  (COMPAZINE ) 10 MG tablet prochlorperazine  maleate 10 mg tablet TAKE 1 TABLET (10 MG TOTAL) BY MOUTH EVERY 6 (SIX) HOURS AS NEEDED (NAUSEA OR VOMITING).  promethazine  (PHENERGAN ) 25 MG tablet promethazine  25 mg tablet TAKE 1 TABLET (25 MG TOTAL) BY MOUTH EVERY SIX (6) HOURS AS NEEDED FOR NAUSEA FOR UP TO 7 DAYS.  traMADoL  (ULTRAM ) 50 mg tablet tramadol  50 mg tablet TAKE 1-2 TABLETS BY MOUTH EVERY 6 HOURS AS NEEDED.  triamcinolone  0.1 % cream APPLY DAILY TO SKIN TO AFFECTED AREA TWICE A DAY FOR 2 WEEKS   No current facility-administered medications on file prior to visit.   Family History  Problem Relation Age of Onset  Stroke Mother  Hyperlipidemia (Elevated cholesterol) Father  Skin cancer Sister    Social History   Tobacco Use  Smoking Status Never  Smokeless Tobacco Never    Social History   Socioeconomic History  Marital status: Unknown  Tobacco Use  Smoking status: Never  Smokeless tobacco: Never  Vaping Use  Vaping status: Unknown  Substance and Sexual Activity  Alcohol  use: Never  Drug use: Never   Social Drivers of Corporate investment banker Strain: Low Risk (11/04/2023)  Received from [facility]  Overall Financial Resource Strain (CARDIA)  How hard is it for you to pay for the very basics like food, housing, medical care, and heating?: Not hard at all  Food Insecurity: No Food Insecurity (11/04/2023)  Received from [facility]  Hunger Vital Sign  Within the past 12 months, you worried that your food would run out before you got the money to buy more.: Never true  Within the past 12 months, the food you bought just didn't last and you didn't have money to get more.: Never true  Transportation Needs: No Transportation Needs (11/04/2023)  Received from [facility] - Transportation  In the past 12 months, has lack of transportation kept you from medical appointments or from getting medications?: No  In the past 12 months, has lack of transportation kept you from meetings, work, or from getting things needed for daily living?: No  Physical Activity: Insufficiently Active (11/04/2023)  Received from [facility]  Exercise Vital Sign  On average, how many days per week do you engage in moderate to strenuous exercise (like a brisk walk)?: 6 days  On average, how many minutes do you engage in exercise at this level?: 20 min  Stress: No Stress Concern Present (11/04/2023)  Received from [facility] - Occupational Stress Questionnaire  Do you feel stress - tense, restless, nervous, or anxious, or unable  to sleep at night because your mind is troubled all the time - these days?: Not at all  Social Connections: Socially Integrated (11/04/2023)  Received from [facility]  Social Connection and Isolation Panel  In a typical week, how many times do you talk on the phone with family, friends, or neighbors?: More than three times a week  How often do you get together with friends or relatives?: More than three times a week  How often do you attend church or religious services?: More than 4 times per year  Do you belong to any clubs or organizations such as church groups, unions, fraternal or athletic groups, or school groups?: Yes  How often do you attend meetings of the clubs or organizations you belong to?: More than 4 times per year  Are you married, widowed, divorced, separated, never married, or living with a partner?: Married  Housing Stability: Unknown (12/10/2023)  Housing Stability Vital Sign  Homeless in the Last Year: No   Objective:   Vitals:  BP: (!) 157/78  Pulse: 101  Temp: 36.8 C (98.3 F)  SpO2: 96%  Weight: 99.1 kg (218 lb 6.4 oz)  Height: 165.1 cm (5' 5)  PainSc: 0-No pain  PainLoc:  Abdomen   Body mass index is 36.34 kg/m.  Physical Exam Vitals reviewed.  Constitutional:  General: She is not in acute distress. Appearance: Normal appearance.  HENT:  Head: Normocephalic and atraumatic.  Right Ear: External ear normal.  Left Ear: External ear normal.  Nose: Nose normal.  Mouth/Throat:  Mouth: Mucous membranes are moist.  Pharynx: Oropharynx is clear.  Eyes:  General: No scleral icterus. Extraocular Movements: Extraocular movements intact.  Conjunctiva/sclera: Conjunctivae normal.  Pupils: Pupils are equal, round, and reactive to light.  Cardiovascular:  Rate and Rhythm: Normal rate and regular rhythm.  Pulses: Normal pulses.  Heart sounds: Normal heart sounds.  Pulmonary:  Effort: Pulmonary effort is normal. No respiratory distress.  Breath sounds: Normal breath sounds.  Abdominal:  General: Bowel sounds are normal.  Palpations: Abdomen is soft.  Tenderness: There is no abdominal tenderness.  Musculoskeletal:  General: No swelling, tenderness or deformity. Normal range of motion.  Cervical back: Normal range of motion and neck supple.  Skin: General: Skin is warm and dry.  Coloration: Skin is not jaundiced.  Neurological:  General: No focal deficit present.  Mental Status: She is alert and oriented to person, place, and time.  Psychiatric:  Mood and Affect: Mood normal.  Behavior: Behavior normal.     Breast: There is no palpable mass in either breast. There is a small round palpable seroma cavity in the right axilla. There is no palpable adenopathy. The right breast is a little bit thicker and edematous than the left breast likely secondary to radiation.  Labs, Imaging and Diagnostic Testing:  Assessment and Plan:  Diagnoses and all orders for this visit:  Gallbladder polyp - [facility] Case Posting Request; Future    The patient is about 3-1/2 years status post bilateral lumpectomies for bilateral breast cancer. She now has 2 polyps in the  gallbladder 1 of which may be getting slightly larger. Because of the risk of malignancy the recommendation is to have the gallbladder removed. She would also like to have this done. I have discussed with her in detail the risks and benefits of the operation as well as some of the technical aspects including the risk of common bile duct injury and she understands and wishes to proceed. Plan for laparoscopic cholecystectomy

## 2024-01-13 NOTE — Interval H&P Note (Signed)
 History and Physical Interval Note:  01/13/2024 8:09 AM  [name]  has presented today for surgery, with the diagnosis of GALLBLADDER POLYP.  The various methods of treatment have been discussed with the patient and family. After consideration of risks, benefits and other options for treatment, the patient has consented to  Procedure(s): LAPAROSCOPIC CHOLECYSTECTOMY (N/A) as a surgical intervention.  The patient's history has been reviewed, patient examined, no change in status, stable for surgery.  I have reviewed the patient's chart and labs.  Questions were answered to the patient's satisfaction.     [name]

## 2024-01-14 ENCOUNTER — Encounter (HOSPITAL_COMMUNITY): Payer: Self-pay | Admitting: General Surgery

## 2024-01-14 LAB — SURGICAL PATHOLOGY

## 2024-01-15 ENCOUNTER — Ambulatory Visit: Payer: Self-pay | Admitting: General Surgery

## 2024-01-21 DIAGNOSIS — E531 Pyridoxine deficiency: Secondary | ICD-10-CM | POA: Diagnosis not present

## 2024-01-21 DIAGNOSIS — E559 Vitamin D deficiency, unspecified: Secondary | ICD-10-CM | POA: Diagnosis not present

## 2024-01-21 DIAGNOSIS — R270 Ataxia, unspecified: Secondary | ICD-10-CM | POA: Diagnosis not present

## 2024-01-21 DIAGNOSIS — E538 Deficiency of other specified B group vitamins: Secondary | ICD-10-CM | POA: Diagnosis not present

## 2024-01-21 DIAGNOSIS — G609 Hereditary and idiopathic neuropathy, unspecified: Secondary | ICD-10-CM | POA: Diagnosis not present

## 2024-01-21 DIAGNOSIS — R202 Paresthesia of skin: Secondary | ICD-10-CM | POA: Diagnosis not present

## 2024-01-22 DIAGNOSIS — M25511 Pain in right shoulder: Secondary | ICD-10-CM | POA: Diagnosis not present

## 2024-02-12 DIAGNOSIS — M25511 Pain in right shoulder: Secondary | ICD-10-CM | POA: Diagnosis not present

## 2024-02-16 ENCOUNTER — Encounter: Payer: Self-pay | Admitting: Nurse Practitioner

## 2024-02-17 ENCOUNTER — Telehealth: Payer: Self-pay | Admitting: Nurse Practitioner

## 2024-02-17 NOTE — Telephone Encounter (Signed)
 I have contacted and scheduled [name] for an appointment with Lacie on 10/13 at [facility]

## 2024-02-22 ENCOUNTER — Inpatient Hospital Stay: Attending: Nurse Practitioner | Admitting: Nurse Practitioner

## 2024-02-22 ENCOUNTER — Encounter: Payer: Self-pay | Admitting: Nurse Practitioner

## 2024-02-22 VITALS — BP 155/78 | HR 71 | Temp 98.4°F | Resp 18 | Ht 65.0 in | Wt 212.5 lb

## 2024-02-22 DIAGNOSIS — Z8701 Personal history of pneumonia (recurrent): Secondary | ICD-10-CM | POA: Diagnosis not present

## 2024-02-22 DIAGNOSIS — Z1721 Progesterone receptor positive status: Secondary | ICD-10-CM | POA: Diagnosis not present

## 2024-02-22 DIAGNOSIS — Z79811 Long term (current) use of aromatase inhibitors: Secondary | ICD-10-CM | POA: Insufficient documentation

## 2024-02-22 DIAGNOSIS — K760 Fatty (change of) liver, not elsewhere classified: Secondary | ICD-10-CM | POA: Insufficient documentation

## 2024-02-22 DIAGNOSIS — C50411 Malignant neoplasm of upper-outer quadrant of right female breast: Secondary | ICD-10-CM | POA: Diagnosis not present

## 2024-02-22 DIAGNOSIS — Z79899 Other long term (current) drug therapy: Secondary | ICD-10-CM | POA: Insufficient documentation

## 2024-02-22 DIAGNOSIS — Z1732 Human epidermal growth factor receptor 2 negative status: Secondary | ICD-10-CM | POA: Insufficient documentation

## 2024-02-22 DIAGNOSIS — Z923 Personal history of irradiation: Secondary | ICD-10-CM | POA: Diagnosis not present

## 2024-02-22 DIAGNOSIS — M797 Fibromyalgia: Secondary | ICD-10-CM | POA: Insufficient documentation

## 2024-02-22 DIAGNOSIS — I10 Essential (primary) hypertension: Secondary | ICD-10-CM | POA: Diagnosis not present

## 2024-02-22 DIAGNOSIS — Z9221 Personal history of antineoplastic chemotherapy: Secondary | ICD-10-CM | POA: Insufficient documentation

## 2024-02-22 DIAGNOSIS — M8580 Other specified disorders of bone density and structure, unspecified site: Secondary | ICD-10-CM | POA: Insufficient documentation

## 2024-02-22 DIAGNOSIS — E039 Hypothyroidism, unspecified: Secondary | ICD-10-CM | POA: Insufficient documentation

## 2024-02-22 DIAGNOSIS — Z860101 Personal history of adenomatous and serrated colon polyps: Secondary | ICD-10-CM | POA: Diagnosis not present

## 2024-02-22 DIAGNOSIS — E785 Hyperlipidemia, unspecified: Secondary | ICD-10-CM | POA: Diagnosis not present

## 2024-02-22 DIAGNOSIS — Z170 Estrogen receptor positive status [ER+]: Secondary | ICD-10-CM | POA: Diagnosis not present

## 2024-02-22 DIAGNOSIS — G4730 Sleep apnea, unspecified: Secondary | ICD-10-CM | POA: Diagnosis not present

## 2024-02-22 DIAGNOSIS — Z85828 Personal history of other malignant neoplasm of skin: Secondary | ICD-10-CM | POA: Insufficient documentation

## 2024-02-22 DIAGNOSIS — D0512 Intraductal carcinoma in situ of left breast: Secondary | ICD-10-CM | POA: Insufficient documentation

## 2024-02-22 DIAGNOSIS — Z79890 Hormone replacement therapy (postmenopausal): Secondary | ICD-10-CM | POA: Insufficient documentation

## 2024-02-22 NOTE — Progress Notes (Signed)
 [facility]   [phone] [phone]    Patient Care Team: Panosh, Apolinar POUR, MD as PCP - General Leva Rush, MD as Consulting Physician (Obstetrics and Gynecology) Dr DELENA Duwayne Purchase, MD as Consulting Physician (Orthopedic Surgery) Aneita Gwendlyn DASEN, MD (Inactive) as Consulting Physician (Gastroenterology) Burnetta Aures, MD as Consulting Physician (Orthopedic Surgery) Tyree Nanetta SAILOR, RN as Oncology Nurse Navigator Curvin Deward MOULD, MD as Consulting Physician (General Surgery) Lanny Callander, MD as Consulting Physician (Hematology) Izell Domino, MD as Attending Physician (Radiation Oncology) Robinson Pao, MD as Consulting Physician (Dermatology) Pascal Alm PARAS, MD as Referring Physician (Cardiology) Ann Mayme POUR, NP as Nurse Practitioner (Nurse Practitioner) Lionell Jon DEL, [facility] (Pharmacist)   CHIEF COMPLAINT: 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  Oncology History Overview Note  Cancer Staging Ductal carcinoma in situ (DCIS) of left breast Staging form: Breast, AJCC 8th Edition - Clinical stage from 06/21/2020: Stage 0 (cTis (DCIS), cN0, cM0, G2, ER+, PR+, HER2: Not Assessed) - Signed by Lanny Callander, MD on 06/26/2020 Stage prefix: Initial diagnosis  Malignant neoplasm of upper-outer quadrant of right breast in female, estrogen receptor positive (HCC) Staging form: Breast, AJCC 8th Edition - Clinical stage from 06/21/2020: Stage IA (cT1b, cN0, cM0, G2, ER+, PR+, HER2-) - Signed by Lanny Callander, MD on 06/26/2020 Stage prefix: Initial diagnosis - Pathologic stage from 07/26/2020: Stage IA (pT1c, pN1a, cM0, G2, ER+, PR+, HER2-) - Signed by Lanny Callander, MD on 08/09/2020 Stage prefix: Initial diagnosis Nuclear grade: G2 Histologic grading system: 3 grade system Residual tumor (R): R0 - None    Malignant neoplasm of upper-outer quadrant of right breast in female, estrogen receptor positive (HCC)  06/08/2020 Mammogram   IMPRESSION: 1. 9  x 7 x 6 mm mass in the 12 o'clock position of the right breast, 2cmfn with imaging features highly suspicious for malignancy. 2. 4 mm group of indeterminate calcifications in the 12 o'clock position of the left breast and 4 mm group of indeterminate calcifications in the 1 o'clock position of the left breast. Together, the groups span an area measuring 3.9 cm.   06/21/2020 Cancer Staging   Staging form: Breast, AJCC 8th Edition - Clinical stage from 06/21/2020: Stage IA (cT1b, cN0, cM0, G2, ER+, PR+, HER2-) - Signed by Lanny Callander, MD on 06/26/2020 Stage prefix: Initial diagnosis   06/21/2020 Initial Biopsy   Diagnosis 1. Breast, right, needle core biopsy, 12 oc - INVASIVE MAMMARY CARCINOMA - MAMMARY CARCINOMA IN SITU - SEE COMMENT 2. Breast, left, needle core biopsy, 12 oc - MAMMARY CARCINOMA IN-SITU WITH NECROSIS AND CALCIFICATIONS - SEE COMMENT 3. Breast, left, needle core biopsy, 1 oc - MAMMARY CARCINOMA IN-SITU WITH NECROSIS AND CALCIFICATIONS - SEE COMMENT Microscopic Comment 1. The biopsy material shows an infiltrative proliferation of cells with large vesicular nuclei with inconspicuous nucleoli, arranged linearly and in small clusters. Based on the biopsy, the carcinoma appears Nottingham grade 2 of 3.  Addendum: 1. E-cadherin is POSITIVE supporting a ductal origin. 2. E-cadherin is POSITIVE supporting a ductal origin.  3. E-cadherin is positive supporting a ductal origin. The focus is less pronounced on the deeper sections and in isolation would likely be considered atypical ductal hyperplasia.   06/21/2020 Receptors her2   1. PROGNOSTIC INDICATORS Results: IMMUNOHISTOCHEMICAL AND MORPHOMETRIC ANALYSIS PERFORMED MANUALLY The tumor cells are EQUIVOCAL for Her2 (2+). HER2 by FISH will be performed and the results reported separately Estrogen Receptor: 95%, POSITIVE, STRONG STAINING INTENSITY Progesterone  Receptor:  40%, POSITIVE, MODERATE STAINING INTENSITY Proliferation  Marker Ki67: 10%  1. FLUORESCENCE IN-SITU HYBRIDIZATION Results: GROUP 5: HER2 **NEGATIVE** Equivocal form of amplification of the HER2 gene was detected in the IHC 2+ tissue sample received from this individual. HER2 FISH was performed by a technologist and cell imaging and analysis on the [employer].   06/25/2020 Initial Diagnosis   Malignant neoplasm of upper-outer quadrant of right breast in female, estrogen receptor positive (HCC)   07/03/2020 Breast MRI   IMPRESSION: 1. Known RIGHT breast cancer, 12 o'clock axis, at anterior depth, measuring 1 cm greatest extent, manifesting as a spiculated enhancing mass on MRI, with associated biopsy clip. Expected post biopsy changes are seen within the adjacent outer RIGHT breast. 2. No evidence of additional multifocal or multicentric disease within the RIGHT breast. 3. Known LEFT breast DCIS within the slightly outer LEFT breast, at anterior depth, corresponding to the biopsy site labeled 1 o'clock axis, with associated enhancement only at the margins of the biopsy cavity measuring up to 5 mm greatest dimension. 4. Known LEFT breast DCIS within the upper central LEFT breast, at middle depth, corresponding to the biopsy site labeled 12 o'clock axis. Contiguous linear non-mass enhancement extends 2.3 cm superior-medial to the biopsy cavity, most likely post biopsy change but possibly contiguous extent of disease. 5. No evidence of additional multifocal or multicentric disease within the LEFT breast. 6. No evidence of metastatic lymphadenopathy.     07/26/2020 Cancer Staging   Staging form: Breast, AJCC 8th Edition - Pathologic stage from 07/26/2020: Stage IA (pT1c, pN1a, cM0, G2, ER+, PR+, HER2-) - Signed by Lanny Callander, MD on 08/09/2020 Stage prefix: Initial diagnosis Nuclear grade: G2 Histologic grading system: 3 grade system Residual tumor (R): R0 - None   07/26/2020 Surgery   RIGHT BREAST LUMPECTOMY WITH RADIOACTIVE SEED AND SENTINEL LYMPH  NODE BIOPSY by Dr Curvin   07/26/2020 Pathology Results   FINAL MICROSCOPIC DIAGNOSIS:   A. LYMPH NODE, RIGHT AXILLARY #1, SENTINEL, EXCISION:  - Lymph node, negative for carcinoma (0/1)   B. LYMPH NODE, RIGHT AXILLARY, SENTINEL, EXCISION:  - Benign fibroadipose tissue, negative for carcinoma   C. BREAST, RIGHT, LUMPECTOMY:  - Invasive ductal carcinoma, 1.5 cm, grade 2  - Ductal carcinoma in situ, low grade  - Resection margins are negative for carcinoma; closest is the anterior margin of 0.2 cm  - Biopsy site changes  - See oncology table   D. BREAST, LEFT, LUMPECTOMY:  - Benign breast parenchyma with prominent biopsy-related changes  - Negative for residual ductal carcinoma in situ  - See oncology table   E. LYMPH NODE, RIGHT AXILLARY #2, SENTINEL, EXCISION:  - Invasive ductal carcinoma, see comment  COMMENT:  E.  Lymph node tissue is not identified.  Findings likely represent an entirely replaced lymph node with foci of extranodal extension.    07/26/2020 Miscellaneous   Mammaprint High Risk of Luminla Type B 29% risk of recurrence in 10 years if untreated.  Her Mammaprint index is -0.175 She has 94.6% benefit of chemotherapy and hormaonal therapy.      08/20/2020 Imaging   CT C/A/P IMPRESSION: 1. No definitive imaging findings to suggest metastatic disease in the chest, abdomen or pelvis. 2. Postoperative changes of bilateral lumpectomy and right axillary lymph node dissection with what appears to be a large postoperative seroma in the right axilla, as detailed above. Attention on follow-up studies is recommended to ensure the stability or regression of this collection. 3. Additional incidental findings, as above.  08/28/2020 Imaging   Bone Scan IMPRESSION: No definite scintigraphic evidence of osseous metastases.   08/30/2020 - 11/02/2020 Chemotherapy   Adjuvant Docetaxel  and Cytoxan  q3weeks for 4 cycles starting 08/30/20. Given skin rash, changes taxol  to Abraxane   starting with C2 (09/21/20). Completed 11/02/20.    12/03/2020 - 01/15/2021 Radiation Therapy   Bilateral breast radiation and right regional lymph nodes   02/2021 -  Anti-estrogen oral therapy   Adjuvant exemestane    04/11/2021 Survivorship   SCP delivered by Keymoni Mccaster, NP   Ductal carcinoma in situ (DCIS) of left breast  06/08/2020 Mammogram   IMPRESSION: 1. 9 x 7 x 6 mm mass in the 12 o'clock position of the right breast, 2cmfn with imaging features highly suspicious for malignancy. 2. 4 mm group of indeterminate calcifications in the 12 o'clock position of the left breast and 4 mm group of indeterminate calcifications in the 1 o'clock position of the left breast. Together, the groups span an area measuring 3.9 cm.   06/21/2020 Cancer Staging   Staging form: Breast, AJCC 8th Edition - Clinical stage from 06/21/2020: Stage 0 (cTis (DCIS), cN0, cM0, G2, ER+, PR+, HER2: Not Assessed) - Signed by Lanny Callander, MD on 06/26/2020 Stage prefix: Initial diagnosis   06/21/2020 Initial Biopsy   Diagnosis 1. Breast, right, needle core biopsy, 12 oc - INVASIVE MAMMARY CARCINOMA - MAMMARY CARCINOMA IN SITU - SEE COMMENT 2. Breast, left, needle core biopsy, 12 oc - MAMMARY CARCINOMA IN-SITU WITH NECROSIS AND CALCIFICATIONS - SEE COMMENT 3. Breast, left, needle core biopsy, 1 oc - MAMMARY CARCINOMA IN-SITU WITH NECROSIS AND CALCIFICATIONS - SEE COMMENT Microscopic Comment 1. The biopsy material shows an infiltrative proliferation of cells with large vesicular nuclei with inconspicuous nucleoli, arranged linearly and in small clusters. Based on the biopsy, the carcinoma appears Nottingham grade 2 of 3.  Addendum: 1. E-cadherin is POSITIVE supporting a ductal origin. 2. E-cadherin is POSITIVE supporting a ductal origin.  3. E-cadherin is positive supporting a ductal origin. The focus is less pronounced on the deeper sections and in isolation would likely be considered atypical ductal hyperplasia.    06/21/2020 Receptors her2   2. PROGNOSTIC INDICATORS Results: IMMUNOHISTOCHEMICAL AND MORPHOMETRIC ANALYSIS PERFORMED MANUALLY Estrogen Receptor: 95%, POSITIVE, STRONG STAINING INTENSITY Progesterone  Receptor: 30%, POSITIVE, STRONG STAINING INTENSITY   06/25/2020 Initial Diagnosis   Ductal carcinoma in situ (DCIS) of left breast   07/03/2020 Breast MRI   IMPRESSION: 1. Known RIGHT breast cancer, 12 o'clock axis, at anterior depth, measuring 1 cm greatest extent, manifesting as a spiculated enhancing mass on MRI, with associated biopsy clip. Expected post biopsy changes are seen within the adjacent outer RIGHT breast. 2. No evidence of additional multifocal or multicentric disease within the RIGHT breast. 3. Known LEFT breast DCIS within the slightly outer LEFT breast, at anterior depth, corresponding to the biopsy site labeled 1 o'clock axis, with associated enhancement only at the margins of the biopsy cavity measuring up to 5 mm greatest dimension. 4. Known LEFT breast DCIS within the upper central LEFT breast, at middle depth, corresponding to the biopsy site labeled 12 o'clock axis. Contiguous linear non-mass enhancement extends 2.3 cm superior-medial to the biopsy cavity, most likely post biopsy change but possibly contiguous extent of disease. 5. No evidence of additional multifocal or multicentric disease within the LEFT breast. 6. No evidence of metastatic lymphadenopathy.     07/26/2020 Surgery   LEFT BREAST LUMPECTOMY X 2  WITH RADIOACTIVE SEED LOCALIZATION by Dr Curvin   07/26/2020 Pathology  Results   FINAL MICROSCOPIC DIAGNOSIS:   A. LYMPH NODE, RIGHT AXILLARY #1, SENTINEL, EXCISION:  - Lymph node, negative for carcinoma (0/1)   B. LYMPH NODE, RIGHT AXILLARY, SENTINEL, EXCISION:  - Benign fibroadipose tissue, negative for carcinoma   C. BREAST, RIGHT, LUMPECTOMY:  - Invasive ductal carcinoma, 1.5 cm, grade 2  - Ductal carcinoma in situ, low grade  - Resection margins  are negative for carcinoma; closest is the anterior margin of 0.2 cm  - Biopsy site changes  - See oncology table   D. BREAST, LEFT, LUMPECTOMY:  - Benign breast parenchyma with prominent biopsy-related changes  - Negative for residual ductal carcinoma in situ  - See oncology table   E. LYMPH NODE, RIGHT AXILLARY #2, SENTINEL, EXCISION:  - Invasive ductal carcinoma, see comment  COMMENT:  E.  Lymph node tissue is not identified.  Findings likely represent an entirely replaced lymph node with foci of extranodal extension.    12/03/2020 - 01/15/2021 Radiation Therapy   Bilateral breast radiation and right regional lymph nodes   02/2021 -  Anti-estrogen oral therapy   Adjuvant exemestane    04/11/2021 Survivorship   SCP delivered by Myracle Febres, NP      CURRENT THERAPY: Did not tolerate antiestrogen therapy, currently on surveillance  INTERVAL HISTORY [name] presents for right breast concern.  Last seen by me for routine surveillance 09/22/2023.  Sent a message 10/7 reporting soreness and swelling in the right breast with a lump that has decreased with massaging.  She has history of lymphedema.  Today she presents by herself.  She tore the right rotator cuff and bicep tendon and wonders if that triggered right breast lymphedema it has been feeling more sore lately.  She has done the massages that she learned from PT.  Denies palpable mass or nipple discharge.  She has had some allergies lately and felt a left cervical lymph node that is slightly tender.  Had a tooth pulled in April.  She continues to have neuropathy in her feet and sometimes tingles all over with achiness similar to fibromyalgia.  She is under neurology care. Continues to walk more than 4 days/week.   ROS  All other systems reviewed and negative  Past Medical History:  Diagnosis Date   Anal fissure    Atrial septal aneurysm / if pfo  echo 6 13  10/23/2011   Breast cancer (HCC)    Cataract    both eyes    Depression    Dysrhythmia    Palpitations   Fatty liver    pre fatty liver   Fibromyalgia    Headache(784.0)    hx of migraines when younger   Heart palpitations    hx with normal holter event monitoring   Hx: UTI (urinary tract infection)    Hyperlipidemia    Hypertension    Hypothyroidism    Obesity    Personal history of chemotherapy    Personal history of radiation therapy    PFO (patent foramen ovale) 07/20/2017   PFO with right to left flow at rest on saline microcavitation study   Pneumonia 1972   hx of   Polyp of colon    Serrated adenoma of colon 08/2012   Skin cancer    basal, squamous cell   Sleep apnea    uses cpap     Past Surgical History:  Procedure Laterality Date   BREAST LUMPECTOMY     BREAST LUMPECTOMY WITH RADIOACTIVE SEED AND SENTINEL LYMPH NODE BIOPSY  Right 07/26/2020   Procedure: RIGHT BREAST LUMPECTOMY WITH RADIOACTIVE SEED AND SENTINEL LYMPH NODE BIOPSY;  Surgeon: Curvin Deward MOULD, MD;  Location: [facility] OR;  Service: General;  Laterality: Right;   BREAST LUMPECTOMY WITH RADIOACTIVE SEED LOCALIZATION Left 07/26/2020   Procedure: LEFT BREAST LUMPECTOMY X 2  WITH RADIOACTIVE SEED LOCALIZATION;  Surgeon: Curvin Deward MOULD, MD;  Location: [facility] OR;  Service: General;  Laterality: Left;   CHOLECYSTECTOMY N/A 01/13/2024   Procedure: LAPAROSCOPIC CHOLECYSTECTOMY;  Surgeon: Curvin Deward MOULD, MD;  Location: [facility] OR;  Service: General;  Laterality: N/A;   COLONOSCOPY     DILATION AND CURETTAGE OF UTERUS  1978   DRUG INDUCED ENDOSCOPY     ECTOPIC PREGNANCY SURGERY  1983   KNEE ARTHROSCOPY     both in past   POLYPECTOMY     SHOULDER OPEN ROTATOR CUFF REPAIR Left 07/07/2013   Procedure: LEFT SHOULDER MINI OPEN SUBACROMIAL DECOMPRESSION ROTATOR CUFF REPAIR AND POSSIBLE PATCH GRAFT ;  Surgeon: Reyes JAYSON Billing, MD;  Location: [facility];  Service: Orthopedics;  Laterality: Left;  with interscaline block   SKIN CANCER EXCISION Bilateral    arm, legs, and chest   TONSILLECTOMY        Outpatient Encounter Medications as of 02/22/2024  Medication Sig Note   ALOE VERA JUICE PO Take 1-2 fluid ounces by mouth 3 (three) times daily with meals.    amLODipine (NORVASC) 10 MG tablet Take 5 mg by mouth daily.    Ascorbic Acid  (VITAMIN C ) 1000 MG tablet Take 1,000 mg by mouth daily.    b complex vitamins capsule Take 1 capsule by mouth daily.    colchicine 0.6 MG tablet Take 0.6 mg by mouth daily. 01/06/2024: Has not started yet   Digestive Aids Mixture (BILE ACID FACTORS PO) Take 1 tablet by mouth 3 (three) times daily with meals.    MAGNESIUM  CITRATE PO Take 1 capsule by mouth daily.    melatonin 5 MG TABS Take 5 mg by mouth at bedtime.    Multiple Vitamin (MULTIVITAMIN) tablet Take 1 tablet by mouth daily.    NON FORMULARY Pt uses a c-pap nightly    Omega-3 Fatty Acids (OMEGA 3 PO) Take 1 capsule by mouth daily.    OVER THE COUNTER MEDICATION Take 1 capsule by mouth in the morning and at bedtime. Infla Med    OVER THE COUNTER MEDICATION Take 2 capsules by mouth in the morning and at bedtime. Essential Pro    OVER THE COUNTER MEDICATION Take 1 tablet by mouth daily. MagPlex    potassium chloride  SA (KLOR-CON  M) 20 MEQ tablet TAKE ONE TABLET BY MOUTH ONCE A DAY (Patient taking differently: Take 10 mEq by mouth daily. Takes 1/2 tablet daily)    Probiotic Product (PROBIOTIC PO) Take 1 capsule by mouth daily.    thyroid  (ARMOUR THYROID ) 90 MG tablet Take 45-90 mg by mouth See admin instructions. Take 45 mg by mouth every other day, alternating with 90 mg on alternate days    VITAMIN D -VITAMIN K PO Take 2 tablets by mouth daily.    ZINC GLUCONATE PO Take 10 mg by mouth daily.    NALTREXONE HCL PO Take 2 mg by mouth in the morning and at bedtime. (Patient not taking: Reported on 02/22/2024)    oxyCODONE  (ROXICODONE ) 5 MG immediate release tablet Take 1 tablet (5 mg total) by mouth every 6 (six) hours as needed. (Patient not taking: Reported on 02/22/2024)    pantoprazole  (PROTONIX ) 40  MG  tablet Take 1 tablet (40 mg total) by mouth daily. (Patient not taking: Reported on 02/22/2024)    No facility-administered encounter medications on file as of 02/22/2024.     Today's Vitals   02/22/24 0924 02/22/24 0928 02/22/24 0934  BP: 135/82  (!) 155/78  Pulse: 71    Resp: 18    Temp: 98.4 F (36.9 C)    TempSrc: Temporal    SpO2: 100%    Weight: 212 lb 8 oz (96.4 kg)    Height: 5' 5 (1.651 m)    PainSc:  2     Body mass index is 35.36 kg/m.    PHYSICAL EXAM GENERAL:alert, no distress and comfortable SKIN: no rash  HEENT : sclera clear.  Neck without mass.  Small left submandibular/high cervical lymph node LYMPH:  no palpable supraclavicular or axillary lymphadenopathy  LUNGS: clear with normal breathing effort HEART: regular rate & rhythm, mild bilateral ankle edema ABDOMEN: abdomen soft, non-tender and normal bowel sounds NEURO: alert & oriented x 3 with fluent speech, no focal motor/sensory deficits Breast exam: No nipple discharge or inversion.  S/p bilateral lumpectomy, incisions completely healed with mild scar tissue.  Density category B with diffuse tenderness to palpation bilaterally.  No palpable mass or nodularity in either breast or axilla that I could appreciate   CBC    Latest Ref Rng & Units 01/07/2024   11:23 AM 09/22/2023   10:02 AM 06/19/2023   11:49 AM  CBC  WBC 4.0 - 10.5 K/uL 7.8  7.1  6.8   Hemoglobin 12.0 - 15.0 g/dL 86.3  85.9  85.9   Hematocrit 36.0 - 46.0 % 42.1  40.9  42.2   Platelets 150 - 400 K/uL 218  234  216       CMP     Latest Ref Rng & Units 01/07/2024   11:23 AM 09/22/2023   10:02 AM 06/19/2023   11:49 AM  CMP  Glucose 70 - 99 mg/dL 94  94  93   BUN 8 - 23 mg/dL 17  16  18    Creatinine 0.44 - 1.00 mg/dL 9.25  9.30  9.34   Sodium 135 - 145 mmol/L 140  141  139   Potassium 3.5 - 5.1 mmol/L 4.6  3.6  3.9   Chloride 98 - 111 mmol/L 104  106  103   CO2 22 - 32 mmol/L 27  30  28    Calcium 8.9 - 10.3 mg/dL 9.6  9.3  9.4    Total Protein 6.5 - 8.1 g/dL 7.0  7.3  7.2   Total Bilirubin 0.0 - 1.2 mg/dL 1.0  0.7  0.7   Alkaline Phos 38 - 126 U/L 113  106  104   AST 15 - 41 U/L 34  24  24   ALT 0 - 44 U/L 36  28  29       ASSESSMENT & PLAN: [name] is a 68 y.o. female with         1. Malignant neoplasm of upper-outer quadrant of right breast, Stage IB, p(T1cN1aM0), ER+/PR+/HER2-, Grade II AND Left breast DCIS, grade II, ER+/PR+ (removed by biopsy) -diagnosed in 06/2020, s/p b/l lumpectomy and right SNLB by Dr Curvin on 07/26/20.  -Mammaprint showed high risk Luminal Type B with 29% risk of distant recurrence.  -She completed adjuvant chemo with TC.  Due to allergy reaction to docetaxel , it was changed to Abraxane  from cycle 3. -She received adjuvant radiation under Dr. Izell  7/25-01/15/21. -she started exemestane  in late 01/2021. She is receiving financial assistance through [employer] (see MyChart note from 06/11/21). -tolerated exemestane  from 03/2021 until 08/2021 with intolerable side effects including generalized bone/joint pain, breast/pectoralis pain, fatigue, brain fog, mood disturbance and elevated BP.   -She switched to low-dose letrozole  in 10/22/2021 and increased to full dose in 12/2021 but did not tolerate it after 2-3 weeks and stopped 01/2022.  Main side effects were severe cramping and toes curling making it difficult to ambulate and poor quality of life.  SEs have improved since stopping AI. She declined to try tamoxifen  -Screening MRI 12/10/2022 and mammo 06/2023 were benign.     2. Bone Health -she reports her last DEXA was in 03/2019, showing osteopenia.  -repeat per Dr. McComb   3. Comorbidities: Depression, Fibromyalgia, HLD, HTN, Hypothyroidism, H/o Skin cancer (squamous and basal cell), sleep apnea -She is followed for HTN. She takes her BP at home.  -On medications, continue to f/u with therapist and other physicians.    4.  Fatty liver and transaminitis -She has chronic  intermittent transaminitis, attributes to fatty liver.  She is on low sugar diet, drinks a lot of water , does not drink alcohol , and avoids Tylenol  -Liver morphology was unremarkable on staging CT CAP 4/22 -Her LFTs fluctuate, all normal lately -Continue monitoring     Disposition:  Ms. Abelson appears well. Breast exam is benign except diffuse tenderness bilaterally which can be seen in dense breast tissue. Category B on last mammo. No palpable mass. She deferred screening MRI in 11/2023, will proceed now. She may consider returning to PT for lymphedema if MRI is negative.   The small palpable left submandibular/cervical LN is likely secondary to allergies. Will monitor closely.   Will keep f/up on 11/3 as is, for close f/up and review MRI results.    Orders Placed This Encounter  Procedures   MR BREAST BILATERAL W WO CONTRAST INC CAD    [facility], on [address] [place]    Standing Status:   Future    Expected Date:   03/07/2024    Expiration Date:   02/21/2025    If indicated for the ordered procedure, I authorize the administration of contrast media per Radiology protocol:   Yes    What is the patient's sedation requirement?:   No Sedation    Does the patient have a pacemaker or implanted devices?:   No    Preferred imaging location?:   External      All questions were answered. The patient knows to call the clinic with any problems, questions or concerns. No barriers to learning were detected. I spent 20 minutes counseling the patient face to face. The total time spent in the appointment was 30 minutes and more than 50% was on counseling, review of test results, and coordination of care.   Samentha Perham K Desha Bitner, NP 02/22/2024

## 2024-02-22 NOTE — Progress Notes (Signed)
 Per Mayme Silversmith, NP, referral order for MRI faxed to [facility] [place] [phone]

## 2024-02-25 ENCOUNTER — Ambulatory Visit: Admitting: Obstetrics and Gynecology

## 2024-02-25 ENCOUNTER — Encounter: Payer: Self-pay | Admitting: Nurse Practitioner

## 2024-02-25 ENCOUNTER — Encounter: Payer: Self-pay | Admitting: Obstetrics and Gynecology

## 2024-02-25 VITALS — BP 147/83 | HR 79 | Ht 64.96 in | Wt 209.4 lb

## 2024-02-25 DIAGNOSIS — N952 Postmenopausal atrophic vaginitis: Secondary | ICD-10-CM | POA: Diagnosis not present

## 2024-02-25 DIAGNOSIS — N814 Uterovaginal prolapse, unspecified: Secondary | ICD-10-CM

## 2024-02-25 DIAGNOSIS — N3281 Overactive bladder: Secondary | ICD-10-CM | POA: Diagnosis not present

## 2024-02-25 DIAGNOSIS — N8110 Cystocele, unspecified: Secondary | ICD-10-CM | POA: Diagnosis not present

## 2024-02-25 LAB — POCT URINALYSIS DIP (CLINITEK)
Bilirubin, UA: NEGATIVE
Blood, UA: NEGATIVE
Glucose, UA: NEGATIVE mg/dL
Leukocytes, UA: NEGATIVE
Nitrite, UA: NEGATIVE
POC PROTEIN,UA: NEGATIVE
Spec Grav, UA: 1.025 (ref 1.010–1.025)
Urobilinogen, UA: 0.2 U/dL
pH, UA: 5.5 (ref 5.0–8.0)

## 2024-02-25 MED ORDER — ESTRADIOL 0.01 % VA CREA: 0.5000 g | TOPICAL_CREAM | VAGINAL | 11 refills | Status: AC

## 2024-02-25 NOTE — Patient Instructions (Addendum)
 Today we talked about ways to manage bladder urgency such as altering your diet to avoid irritative beverages and foods (bladder diet) as well as attempting to decrease stress and other exacerbating factors.  You can also chew a plain Tums 1-3 times per day to make your urine less acidic, especially if you have eating/drinking acidic things.   The Most Bothersome Foods* The Least Bothersome Foods*  Coffee - Regular & Decaf Tea - caffeinated Carbonated beverages - cola, non-colas, diet & caffeine-free Alcohols - Beer, Red Wine, White Wine, [address] - Grapefruit, [place], Orange, [employer] - Cranberry, Grapefruit, Orange, Pineapple Vegetables - Tomato & Tomato Products Flavor Enhancers - Hot peppers, Spicy foods, Chili, Horseradish, Vinegar, Monosodium glutamate (MSG) Artificial Sweeteners - NutraSweet, Sweet 'N Low, Equal (sweetener), Saccharin Ethnic foods - [place], [place], [place] food Water  Milk - low-fat & whole Fruits - Bananas, Blueberries, Honeydew melon, Pears, Raisins, Watermelon Vegetables - Broccoli, [address] Sprouts, [place], [place], Cauliflower, Avella, Cucumber, Mushrooms, Peas, Radishes, Squash, Zucchini, White potatoes, Sweet potatoes & yams Poultry - Chicken, Eggs, [place], [employer] - Beef, Diplomatic Services operational officer, Lamb Seafood - Shrimp, [place] fish, Salmon Grains - Oat, Rice Snacks - Pretzels, Popcorn  *Mitch ALF et al. Diet and its role in interstitial cystitis/bladder pain syndrome (IC/BPS) and comorbid conditions. [employer]. BJU Int. 2012 Jan 11.    Up to 5gm per day of pumpkin seed extract can be helpful in reduction of spasmodic bladders  Consider your options of surgery  You can continue your pessary for now  Please use the estrogen cream nightly for 2 weeks and then after that twice a week for maintenance

## 2024-02-25 NOTE — Progress Notes (Signed)
 [facility] New Patient Evaluation and Consultation  Referring Provider: Leva Rush, MD PCP: Charlett Apolinar POUR, MD Date of Service: 02/25/2024  SUBJECTIVE Chief Complaint: New Patient (Initial Visit) [name] is a 68 y.o. female here today for female organ prolapse.)  History of Present Illness: [name] is a 68 y.o. White or Caucasian female seen in consultation at the request of Dr. Leva for evaluation of Prolapse.    Review of records significant for: Has done PT and pessary  Currently self managing a #3 ring with support that she has to remove daily and for bowel movements.  Has had multiple abdominal surgeries (Gallbladder and ectopic pregnancy surgery)  Urinary Symptoms: Does not leak urine.  Pad use: 1 liners/ mini-pads per day.   Patient is not bothered by UI symptoms.  Day time voids 9.  Nocturia: 2 times per night to void. Voiding dysfunction:  empties bladder well.  Patient does not use a catheter to empty bladder.  When urinating, patient feels a weak stream and dribbling after finishing Drinks: 60oz water , herbal tea per day  UTIs: 0 UTI's in the last year.   Denies history of blood in urine, kidney or bladder stones, pyelonephritis, bladder cancer, and kidney cancer No results found for the last 90 days.   Pelvic Organ Prolapse Symptoms:                  Patient Admits to a feeling of a bulge the vaginal area. It has been present for 2 years.  Patient Admits to seeing a bulge.  This bulge is bothersome.  Bowel Symptom: Bowel movements: 1-3 time(s) per day Stool consistency: soft  Straining: no.  Splinting: no.  Incomplete evacuation: no.  Patient Denies accidental bowel leakage / fecal incontinence Bowel regimen: diet Last colonoscopy: Date Oct 2023, Results Polyps [facility] Colonoscopy          Upcoming     Colonoscopy (Every 7 Years) Next due on 03/10/2029    03/10/2022  COLONOSCOPY   Only the first 1 history  entries have been loaded, but more history exists.                Sexual Function Sexually active: yes.  Sexual orientation: Straight Pain with sex: No  Pelvic Pain Denies pelvic pain    Past Medical History:  Past Medical History:  Diagnosis Date   Anal fissure    Atrial septal aneurysm / if pfo  echo 6 13  10/23/2011   Breast cancer (HCC)    Cataract    both eyes   Depression    Dysrhythmia    Palpitations   Fatty liver    pre fatty liver   Fibromyalgia    Headache(784.0)    hx of migraines when younger   Heart palpitations    hx with normal holter event monitoring   Hx: UTI (urinary tract infection)    Hyperlipidemia    Hypertension    Hypothyroidism    Obesity    Personal history of chemotherapy    Personal history of radiation therapy    PFO (patent foramen ovale) 07/20/2017   PFO with right to left flow at rest on saline microcavitation study   Pneumonia 1972   hx of   Polyp of colon    Serrated adenoma of colon 08/2012   Skin cancer    basal, squamous cell   Sleep apnea    uses cpap     Past Surgical History:  Past Surgical History:  Procedure Laterality Date   BREAST LUMPECTOMY     BREAST LUMPECTOMY WITH RADIOACTIVE SEED AND SENTINEL LYMPH NODE BIOPSY Right 07/26/2020   Procedure: RIGHT BREAST LUMPECTOMY WITH RADIOACTIVE SEED AND SENTINEL LYMPH NODE BIOPSY;  Surgeon: Curvin Deward MOULD, MD;  Location: [facility];  Service: General;  Laterality: Right;   BREAST LUMPECTOMY WITH RADIOACTIVE SEED LOCALIZATION Left 07/26/2020   Procedure: LEFT BREAST LUMPECTOMY X 2  WITH RADIOACTIVE SEED LOCALIZATION;  Surgeon: Curvin Deward MOULD, MD;  Location: [facility];  Service: General;  Laterality: Left;   CHOLECYSTECTOMY N/A 01/13/2024   Procedure: LAPAROSCOPIC CHOLECYSTECTOMY;  Surgeon: Curvin Deward MOULD, MD;  Location: [facility];  Service: General;  Laterality: N/A;   COLONOSCOPY     DILATION AND CURETTAGE OF UTERUS  1978   DRUG INDUCED ENDOSCOPY     ECTOPIC PREGNANCY  SURGERY  1983   KNEE ARTHROSCOPY     both in past   POLYPECTOMY     SHOULDER OPEN ROTATOR CUFF REPAIR Left 07/07/2013   Procedure: LEFT SHOULDER MINI OPEN SUBACROMIAL DECOMPRESSION ROTATOR CUFF REPAIR AND POSSIBLE PATCH GRAFT ;  Surgeon: Reyes JAYSON Billing, MD;  Location: [facility];  Service: Orthopedics;  Laterality: Left;  with interscaline block   SKIN CANCER EXCISION Bilateral    arm, legs, and chest   TONSILLECTOMY       Past OB/GYN History: [id number] Vaginal deliveries: 6,  Forceps/ Vacuum deliveries: 0, Cesarean section: 0 Menopausal: Yes, at age 29 Contraception: None. Last pap smear was Jan 2025.  Any history of abnormal pap smears: no. HM PAP   This patient has no relevant Health Maintenance data.     Medications: Patient has a current medication list which includes the following prescription(s): aloe vera, amlodipine, vitamin c , b complex vitamins, colchicine, digestive aids mixture, estradiol , magnesium  citrate, melatonin, multivitamin, naltrexone hcl, NON FORMULARY, omega-3 fatty acids, OVER THE COUNTER MEDICATION, OVER THE COUNTER MEDICATION, OVER THE COUNTER MEDICATION, oxycodone , pantoprazole , potassium chloride  sa, probiotic product, thyroid , vitamin d -vitamin k, and zinc gluconate.   Allergies: Patient is allergic to penicillins, taxotere  [docetaxel ], cefdinir, cetirizine, femara  [letrozole ], pegfilgrastim , irbesartan , molds & smuts, and tylenol  [acetaminophen ].   Social History:  Social History   Tobacco Use   Smoking status: Never   Smokeless tobacco: Never  Vaping Use   Vaping status: Never Used  Substance Use Topics   Alcohol  use: Yes    Comment: rare   Drug use: No    Relationship status: married Patient lives with husband and one son.   Patient is not employed. Regular exercise: Yes: stretches and walking History of abuse: No  Family History:   Family History  Problem Relation Age of Onset   Hypertension Mother        low borderline   Osteoporosis  Mother    Hypertension Father    Liver disease Father        amyloid deceased   Hyperlipidemia Father    Melanoma Sister    Juvenile Diabetes Daughter    ADD / ADHD Child    Hyperlipidemia Other        Maternal grandmother   Colon cancer Neg Hx    Stomach cancer Neg Hx    Colon polyps Neg Hx    Esophageal cancer Neg Hx    Rectal cancer Neg Hx    Bladder Cancer Neg Hx    Uterine cancer Neg Hx    Renal cancer Neg Hx      Review of Systems: Review  of Systems  Constitutional:  Positive for malaise/fatigue. Negative for chills and fever.  Respiratory:  Negative for cough and shortness of breath.   Cardiovascular:  Positive for palpitations. Negative for chest pain.  Gastrointestinal:  Positive for abdominal pain. Negative for blood in stool, constipation and diarrhea.  Skin:  Negative for rash.  Neurological:  Positive for dizziness. Negative for weakness.  Psychiatric/Behavioral:  Negative for depression and suicidal ideas.      OBJECTIVE Physical Exam: Vitals:   02/25/24 1005  BP: (!) 147/83  Pulse: 79  Weight: 209 lb 6.4 oz (95 kg)  Height: 5' 4.96 (1.65 m)    Physical Exam Vitals reviewed. Exam conducted with a chaperone present.  Constitutional:      Appearance: Normal appearance.  Pulmonary:     Effort: Pulmonary effort is normal.  Abdominal:     Palpations: Abdomen is soft.  Neurological:     General: No focal deficit present.     Mental Status: She is alert and oriented to person, place, and time.  Psychiatric:        Mood and Affect: Mood normal.        Behavior: Behavior normal. Behavior is cooperative.        Thought Content: Thought content normal.      GU / Detailed Urogynecologic Evaluation:  Pelvic Exam: Normal external female genitalia; Bartholin's and Skene's glands normal in appearance; urethral meatus normal in appearance, no urethral masses or discharge.   CST: negative  Speculum exam reveals normal vaginal mucosa with atrophy. Cervix  normal appearance. Uterus normal single, nontender. Adnexa normal adnexa.    With apex supported, anterior compartment defect was present  Pelvic floor strength III/V  Pelvic floor musculature: Right levator non-tender, Right obturator non-tender, Left levator non-tender, Left obturator non-tender  POP-Q:   POP-Q  1                                            Aa   1                                           Ba  -7                                              C   5.5                                            Gh  3.5                                            Pb  12                                            tvl   -1  Ap  -1                                            Bp  -8                                              D      Rectal Exam:  Normal external exam.  Post-Void Residual (PVR) by Bladder Scan: In order to evaluate bladder emptying, we discussed obtaining a postvoid residual and patient agreed to this procedure.  Procedure: The ultrasound unit was placed on the patient's abdomen in the suprapubic region after the patient had voided.    Post Void Residual - 02/25/24 1412       Post Void Residual   Post Void Residual 27 mL           Laboratory Results: Lab Results  Component Value Date   COLORU yellow 02/25/2024   CLARITYU clear 02/25/2024   GLUCOSEUR negative 02/25/2024   BILIRUBINUR negative 02/25/2024   KETONESU Negative 05/20/2023   SPECGRAV 1.025 02/25/2024   RBCUR negative 02/25/2024   PHUR 5.5 02/25/2024   PROTEINUR NEGATIVE 06/19/2023   UROBILINOGEN 0.2 02/25/2024   LEUKOCYTESUR Negative 02/25/2024    Lab Results  Component Value Date   CREATININE 0.74 01/07/2024   CREATININE 0.69 09/22/2023   CREATININE 0.65 06/19/2023    Lab Results  Component Value Date   HGBA1C 5.2 06/01/2013    Lab Results  Component Value Date   HGB 13.6 01/07/2024     ASSESSMENT AND PLAN Ms. Barren  is a 68 y.o. with:  1. Prolapse of anterior vaginal wall   2. Uterine prolapse   3. OAB (overactive bladder)   4. Vaginal atrophy    Patient is currently using a #3 RWS that she is self managing. We discussed that if she has to remove the pessary for each bowel movement, that it is most likely not a good fit. We discussed changing the pessary to one with more support that would not be pushed by the uterus. She is open to this plan of care.  We discussed options for surgery including Sacrocolpopexy, Sacrospinous hysteropexy, and colpocleisis. Patient is very much sexually active and wishes to continue to be so. She is not interested in mesh, so if she were to wish to go forward with surgery, would prefer vaginal approach. We discussed this would be better due to her high risk of having scar tissue in the abdomen.  We discussed the symptoms of overactive bladder (OAB), which include urinary urgency, urinary frequency, nocturia, with or without urge incontinence.  While we do not know the exact etiology of OAB, several treatment options exist. We discussed management including behavioral therapy (decreasing bladder irritants, urge suppression strategies, timed voids, bladder retraining), physical therapy, medication. Patient is a very holistic person and prefers more natural options. She has done PT. We discussed doing pumpkin seed extract to support her bladder as up to 5gm a day has been shown to be supportive of reduction in OAB. She will consider this.  Patient has vaginal atrophy on exam. She would benefit from estrogen cream. Patient to use a blueberry sized amount into the vagina. She may use this nightly for 2 weeks and then  twice weekly after. We discussed using her finger instead of using the applicator.    Patient to follow up for pessary fitting.     Lavanda Nevels G Kamilya Wakeman, NP

## 2024-02-26 ENCOUNTER — Encounter: Payer: Self-pay | Admitting: Obstetrics and Gynecology

## 2024-02-26 DIAGNOSIS — M6281 Muscle weakness (generalized): Secondary | ICD-10-CM | POA: Diagnosis not present

## 2024-02-26 DIAGNOSIS — M25511 Pain in right shoulder: Secondary | ICD-10-CM | POA: Diagnosis not present

## 2024-02-26 DIAGNOSIS — E039 Hypothyroidism, unspecified: Secondary | ICD-10-CM | POA: Diagnosis not present

## 2024-02-26 DIAGNOSIS — E782 Mixed hyperlipidemia: Secondary | ICD-10-CM | POA: Diagnosis not present

## 2024-02-26 DIAGNOSIS — Z5181 Encounter for therapeutic drug level monitoring: Secondary | ICD-10-CM | POA: Diagnosis not present

## 2024-02-26 DIAGNOSIS — Z79899 Other long term (current) drug therapy: Secondary | ICD-10-CM | POA: Diagnosis not present

## 2024-02-26 DIAGNOSIS — R7989 Other specified abnormal findings of blood chemistry: Secondary | ICD-10-CM | POA: Diagnosis not present

## 2024-02-29 ENCOUNTER — Ambulatory Visit (INDEPENDENT_AMBULATORY_CARE_PROVIDER_SITE_OTHER): Admitting: Family Medicine

## 2024-02-29 ENCOUNTER — Encounter: Payer: Self-pay | Admitting: Family Medicine

## 2024-02-29 ENCOUNTER — Ambulatory Visit: Payer: Self-pay

## 2024-02-29 VITALS — BP 146/80 | HR 75 | Temp 98.4°F | Ht 64.86 in | Wt 208.4 lb

## 2024-02-29 DIAGNOSIS — I10 Essential (primary) hypertension: Secondary | ICD-10-CM

## 2024-02-29 DIAGNOSIS — M25472 Effusion, left ankle: Secondary | ICD-10-CM

## 2024-02-29 DIAGNOSIS — M25471 Effusion, right ankle: Secondary | ICD-10-CM

## 2024-02-29 NOTE — Progress Notes (Signed)
 Established Patient Office Visit   Subjective  Patient ID: [name], female    DOB: [birth year]  Age: 68 y.o. MRN: [id number]  Chief Complaint  Patient presents with   Acute Visit    Patient is having some ankle  swelling numbness and tingling     Pt is a 68 yo female followed by Dr. Charlett who was seen for acute concern.  Pt states she was walking with a neighbor yesterday who noticed her ankles looked swollen.  Pt did not notice/did not have any pain.  States was told to wear compression socks by her Cardiologist, but has yet to get them.  Pt endorses increased walking yesterday while helping her son move/clean his home.  Also notes bp higher than it usually is at home.  Was 143/82 and 141/81.  Pt ate salty foods including pistachios, pickles, and frozen lasagna.     Patient inquires if she has swollen lymph nodes in her neck.  Feels a prominent area in left neck times weeks.    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   Hepatic steatosis 11/27/2014   Hx of adenomatous colonic polyps 11/27/2014   Visit for preventive health examination 03/28/2014   Hyperlipidemia 03/28/2014   Left rotator cuff tear 07/07/2013   Tear of left rotator cuff 07/07/2013   Rhinitis 06/01/2013   Chest wall deformity 04/12/2012   Atrial septal aneurysm / if pfo  echo 6 13  10/23/2011   OSA (obstructive sleep apnea) 11/27/2010   Dyslipidemia 11/01/2010   Flushing 08/29/2010   Labile hypertension 08/29/2010   MUSCLE CRAMPS, FOOT 06/10/2010   OTHER SLEEP DISTURBANCES 06/10/2010   ANXIETY, SITUATIONAL 05/08/2010   VERTIGO, POSITIONAL 03/15/2010   VITAMIN D  DEFICIENCY 02/18/2010   OBESITY 02/18/2010   ALLERGIC RHINITIS 02/18/2010   PLANTAR FASCIITIS 02/18/2010    TWITCHING 06/11/2009   NUMBNESS, HAND 06/11/2009   CERVICAL STRAIN, ACUTE 06/11/2009   OTHER MALAISE AND FATIGUE 07/09/2007   HYPERTENSION 01/28/2007   Hypothyroidism 11/11/2006   GERD 11/11/2006   FIBROMYALGIA 11/11/2006   Past Medical History:  Diagnosis Date   Anal fissure    Atrial septal aneurysm / if pfo  echo 6 13  10/23/2011   Breast cancer (HCC)    Cataract    both eyes   Depression    Dysrhythmia    Palpitations   Fatty liver    pre fatty liver   Fibromyalgia    Headache(784.0)    hx of migraines when younger   Heart palpitations    hx with normal holter event monitoring   Hx: UTI (urinary tract infection)    Hyperlipidemia    Hypertension    Hypothyroidism    Obesity    Personal history of chemotherapy    Personal history of radiation therapy    PFO (patent foramen ovale) 07/20/2017   PFO with right to left flow at rest on saline microcavitation study   Pneumonia 1972   hx of   Polyp of colon    Serrated adenoma of colon 08/2012   Skin cancer    basal, squamous cell   Sleep apnea    uses cpap   Past Surgical History:  Procedure Laterality Date   BREAST LUMPECTOMY     BREAST LUMPECTOMY WITH RADIOACTIVE SEED  AND SENTINEL LYMPH NODE BIOPSY Right 07/26/2020   Procedure: RIGHT BREAST LUMPECTOMY WITH RADIOACTIVE SEED AND SENTINEL LYMPH NODE BIOPSY;  Surgeon: Curvin Deward MOULD, MD;  Location: [facility];  Service: General;  Laterality: Right;   BREAST LUMPECTOMY WITH RADIOACTIVE SEED LOCALIZATION Left 07/26/2020   Procedure: LEFT BREAST LUMPECTOMY X 2  WITH RADIOACTIVE SEED LOCALIZATION;  Surgeon: Curvin Deward MOULD, MD;  Location: [facility];  Service: General;  Laterality: Left;   CHOLECYSTECTOMY N/A 01/13/2024   Procedure: LAPAROSCOPIC CHOLECYSTECTOMY;  Surgeon: Curvin Deward MOULD, MD;  Location: [facility];  Service: General;  Laterality: N/A;   COLONOSCOPY     DILATION AND CURETTAGE OF UTERUS  1978   DRUG INDUCED ENDOSCOPY     ECTOPIC PREGNANCY SURGERY  1983   KNEE ARTHROSCOPY      both in past   POLYPECTOMY     SHOULDER OPEN ROTATOR CUFF REPAIR Left 07/07/2013   Procedure: LEFT SHOULDER MINI OPEN SUBACROMIAL DECOMPRESSION ROTATOR CUFF REPAIR AND POSSIBLE PATCH GRAFT ;  Surgeon: Reyes JAYSON Billing, MD;  Location: [facility];  Service: Orthopedics;  Laterality: Left;  with interscaline block   SKIN CANCER EXCISION Bilateral    arm, legs, and chest   TONSILLECTOMY     Social History   Tobacco Use   Smoking status: Never   Smokeless tobacco: Never  Vaping Use   Vaping status: Never Used  Substance Use Topics   Alcohol  use: Yes    Comment: rare   Drug use: No   Family History  Problem Relation Age of Onset   Hypertension Mother        low borderline   Osteoporosis Mother    Hypertension Father    Liver disease Father        amyloid deceased   Hyperlipidemia Father    Melanoma Sister    Juvenile Diabetes Daughter    ADD / ADHD Child    Hyperlipidemia Other        Maternal grandmother   Colon cancer Neg Hx    Stomach cancer Neg Hx    Colon polyps Neg Hx    Esophageal cancer Neg Hx    Rectal cancer Neg Hx    Bladder Cancer Neg Hx    Uterine cancer Neg Hx    Renal cancer Neg Hx    Allergies  Allergen Reactions   Penicillins Itching, Swelling and Other (See Comments)    REACTION: swelling up as an adult-68 yrs old REACTION: swelling up as a child   Taxotere  [Docetaxel ] Anaphylaxis    08/30/20 Constricted throat sensation of throat and tongue swelling and chest pressure Docetaxel  paused and given Pepcid  20 mg IV, Benadryl  25 mg IV, Ativan  0.5 mg IV x1.  Ativan  0.5 mg IV x1 was repeated after the restart of the patient's chemotherapy. The patient's symptoms abated and docetaxel  restarted.  Then had a recurrence of constriction of throat and was given Ativan  0.5 mg IV x1 for total dose of 1 mg of Ativan .  She was able to complete her treatment with out any further issues or concerns.   Cefdinir Swelling, Itching and Rash    REACTION: Rash, swelling and  itching   Cetirizine Other (See Comments)    unsure   Femara  [Letrozole ] Other (See Comments)    Pt reports severe muscle cramp and exhaustion.    Pegfilgrastim  Other (See Comments)    Bad reaction- unspecified    Irbesartan  Other (See Comments)    See 1 20 note  Heart burn ,  dizziness when taken with diuretic  See 1 20 note  Heart burn , dizziness when taken with diuretic    Molds & Smuts Other (See Comments)    Mold and mildew   Tylenol  [Acetaminophen ] Other (See Comments)    Pre fatty liver: does not takes these    ROS Negative unless stated above    Objective:     BP (!) 146/80 (BP Location: Left Arm, Patient Position: Sitting, Cuff Size: Large)   Pulse 75   Temp 98.4 F (36.9 C) (Oral)   Ht 5' 4.86 (1.647 m)   Wt 208 lb 6.4 oz (94.5 kg)   LMP  (LMP Unknown)   SpO2 99%   BMI 34.83 kg/m  BP Readings from Last 3 Encounters:  02/29/24 (!) 146/80  02/25/24 (!) 147/83  02/22/24 (!) 155/78   Wt Readings from Last 3 Encounters:  02/29/24 208 lb 6.4 oz (94.5 kg)  02/25/24 209 lb 6.4 oz (95 kg)  02/22/24 212 lb 8 oz (96.4 kg)      Physical Exam Constitutional:      General: She is not in acute distress.    Appearance: Normal appearance.  HENT:     Head: Normocephalic and atraumatic.     Nose: Nose normal.     Mouth/Throat:     Mouth: Mucous membranes are moist.  Neck:     Comments: Area of concern in L neck is L side of hyoid bone. Cardiovascular:     Rate and Rhythm: Normal rate and regular rhythm.     Heart sounds: Normal heart sounds. No murmur heard.    No gallop.  Pulmonary:     Effort: Pulmonary effort is normal. No respiratory distress.     Breath sounds: Normal breath sounds. No wheezing, rhonchi or rales.  Musculoskeletal:     Right lower leg: No edema.     Left lower leg: No edema.  Skin:    General: Skin is warm and dry.  Neurological:     Mental Status: She is alert and oriented to person, place, and time.        11/04/2023   10:19 AM  10/29/2022    3:26 PM 09/12/2022   10:19 AM  Depression screen PHQ 2/9  Decreased Interest 0 0 0  Down, Depressed, Hopeless 0 0 0  PHQ - 2 Score 0 0 0  Altered sleeping  0 0  Tired, decreased energy  0 1  Change in appetite  0 0  Feeling bad or failure about yourself   0 0  Trouble concentrating  0 0  Moving slowly or fidgety/restless  0 0  Suicidal thoughts  0 0  PHQ-9 Score  0 1  Difficult doing work/chores  Not difficult at all Not difficult at all      09/12/2022   10:19 AM  GAD 7 : Generalized Anxiety Score  Nervous, Anxious, on Edge 1  Control/stop worrying 0  Worry too much - different things 0  Trouble relaxing 0  Restless 0  Easily annoyed or irritable 0  Afraid - awful might happen 0  Total GAD 7 Score 1  Anxiety Difficulty Not difficult at all     No results found for any visits on 02/29/24.    Assessment & Plan:   Ankle edema, bilateral  Essential hypertension  B/l ankle edema x 1 day, now resolved.  Edema and HTN likely due to increased activity and increased sodium intake.  Supportive care including elevating LEs, decreasing  sodium intake, and compression socks/TED hose.  Continue current medications.  F/u with pcp and specialist prn.  Return if symptoms worsen or fail to improve.   Clotilda JONELLE Single, MD

## 2024-02-29 NOTE — Telephone Encounter (Signed)
 FYI Only or Action Required?: FYI only for provider.  Patient was last seen in primary care on 12/09/2023 by Panosh, Apolinar POUR, MD.  Called Nurse Triage reporting Tingling and Foot Swelling.  Symptoms began yesterday. Ankle swelling yesterday, weakness and tingling 2 years ago  Interventions attempted: Dietary changes and Other: Wearing right tennis shoes.  Symptoms are: gradually worsening.  Triage Disposition: See HCP Within 4 Hours (Or PCP Triage)  Patient/caregiver understands and will follow disposition?: Yes  Copied from CRM #[id number]. Topic: Clinical - Red Word Triage >> Feb 29, 2024 10:56 AM Robinson H wrote: [facility] that prompted transfer to Nurse Triage: Patient having some swelling in left foot, also having some neuropathy in feet and lower legs and sometimes tingle all over. Very if she's allergic amLODipine (NORVASC) 10 MG tablet and when did she start taking it, states she seen something that she might be allergic to med. Reason for Disposition  [1] MILD weakness (e.g., does not interfere with ability to work, go to school, normal activities) AND [2] persists > 1 week  [1] Thigh or calf pain AND [2] only 1 side AND [3] present > 1 hour  Answer Assessment - Initial Assessment Questions 1. LOCATION: Which ankle is swollen? Where is the swelling?     Behind left ankle  2. ONSET: When did the swelling start?     Last night  3. SWELLING: How bad is the swelling? Or, How large is it? (e.g., mild, moderate, severe; size of localized swelling)      Mild  4. PAIN: Is there any pain? If Yes, ask: How bad is it? (Scale 0-10; or none, mild, moderate, severe)     Yes. 3/10 pain.  5. CAUSE: What do you think caused the ankle swelling?     Unsure.  6. OTHER SYMPTOMS: Do you have any other symptoms? (e.g., fever, chest pain, difficulty breathing, calf pain)     Pain behind right thigh starting 1 month ago. 2/10. No swelling or redness.  Answer Assessment -  Initial Assessment Questions Pt reports onset of mild swelling behind left ankle starting last night, as well as mild pain behind right thigh starting 1 month ago. No redness, swelling, CP or SOB. Also reports mild weakness and tingling from waist down starting 2 years ago. Able to walk over 6k steps per day if she is wearing the right shoes. No other neuro changes. Pt initially thought weakness and tingling was r/t chemotherapy she underwent in the past, but now is wondering if it's her amlodipine she start taking in 2011. Repots she is still taking the amlodipine at prescribed. Appt scheduled for today, advised UC or ER for worsening symptoms until then.  1. DESCRIPTION: Describe how you are feeling.     Weakness and tingling from the waist down  2. SEVERITY: How bad is it?  Can you stand and walk?     Able to walk as long as pt is wearing correct shoes. Mild-moderate.  3. ONSET: When did these symptoms begin? (e.g., hours, days, weeks, months)     2 years ago  4. CAUSE: What do you think is causing the weakness or fatigue? (e.g., not drinking enough fluids, medical problem, trouble sleeping)     Had chemotherapy in 2023. Pt also thinks starting amlodipine 03/2021 may be contributing.   5. NEW MEDICINES:  Have you started on any new medicines recently? (e.g., opioid pain medicines, benzodiazepines, muscle relaxants, antidepressants, antihistamines, neuroleptics, beta blockers)  Pt thinks starting amlodipine 03/2021 may be contributing. Took naltrexone for 2 months July-August 2025.  6. OTHER SYMPTOMS: Do you have any other symptoms? (e.g., chest pain, fever, cough, SOB, vomiting, diarrhea, bleeding, other areas of pain)     Tingling from waist down.  Protocols used: Ankle Swelling-A-AH, Weakness (Generalized) and Fatigue-A-AH

## 2024-03-07 ENCOUNTER — Other Ambulatory Visit: Payer: Self-pay

## 2024-03-07 ENCOUNTER — Emergency Department (HOSPITAL_BASED_OUTPATIENT_CLINIC_OR_DEPARTMENT_OTHER)
Admission: EM | Admit: 2024-03-07 | Discharge: 2024-03-07 | Disposition: A | Discharge status: Home / Self Care | Attending: Emergency Medicine | Admitting: Emergency Medicine

## 2024-03-07 ENCOUNTER — Emergency Department (HOSPITAL_BASED_OUTPATIENT_CLINIC_OR_DEPARTMENT_OTHER)

## 2024-03-07 ENCOUNTER — Ambulatory Visit: Payer: Self-pay

## 2024-03-07 ENCOUNTER — Encounter (HOSPITAL_BASED_OUTPATIENT_CLINIC_OR_DEPARTMENT_OTHER): Payer: Self-pay

## 2024-03-07 DIAGNOSIS — Z79899 Other long term (current) drug therapy: Secondary | ICD-10-CM | POA: Insufficient documentation

## 2024-03-07 DIAGNOSIS — I10 Essential (primary) hypertension: Secondary | ICD-10-CM | POA: Diagnosis not present

## 2024-03-07 DIAGNOSIS — R002 Palpitations: Secondary | ICD-10-CM | POA: Diagnosis not present

## 2024-03-07 DIAGNOSIS — Z853 Personal history of malignant neoplasm of breast: Secondary | ICD-10-CM | POA: Insufficient documentation

## 2024-03-07 LAB — BASIC METABOLIC PANEL WITH GFR
Anion gap: 10 (ref 5–15)
BUN: 16 mg/dL (ref 8–23)
CO2: 27 mmol/L (ref 22–32)
Calcium: 9.6 mg/dL (ref 8.9–10.3)
Chloride: 105 mmol/L (ref 98–111)
Creatinine, Ser: 0.82 mg/dL (ref 0.44–1.00)
GFR, Estimated: >60 mL/min (ref >60)
Glucose, Bld: 94 mg/dL (ref 70–99)
Potassium: 3.8 mmol/L (ref 3.5–5.1)
Sodium: 142 mmol/L (ref 135–145)

## 2024-03-07 LAB — CBC WITH DIFFERENTIAL/PLATELET
Abs Immature Granulocytes: 0.02 K/uL (ref 0.00–0.07)
Basophils Absolute: 0 K/uL (ref 0.0–0.1)
Basophils Relative: 0 %
Eosinophils Absolute: 0.2 K/uL (ref 0.0–0.5)
Eosinophils Relative: 2 %
HCT: 40.8 % (ref 36.0–46.0)
Hemoglobin: 13.6 g/dL (ref 12.0–15.0)
Immature Granulocytes: 0 %
Lymphocytes Relative: 18 %
Lymphs Abs: 1.6 K/uL (ref 0.7–4.0)
MCH: 30.4 pg (ref 26.0–34.0)
MCHC: 33.3 g/dL (ref 30.0–36.0)
MCV: 91.3 fL (ref 80.0–100.0)
Monocytes Absolute: 0.6 K/uL (ref 0.1–1.0)
Monocytes Relative: 7 %
Neutro Abs: 6.5 K/uL (ref 1.7–7.7)
Neutrophils Relative %: 73 %
Platelets: 215 K/uL (ref 150–400)
RBC: 4.47 MIL/uL (ref 3.87–5.11)
RDW: 13.9 % (ref 11.5–15.5)
WBC: 8.9 K/uL (ref 4.0–10.5)
nRBC: 0 % (ref 0.0–0.2)

## 2024-03-07 LAB — D-DIMER, QUANTITATIVE: D-Dimer, Quant: 0.4 ug{FEU}/mL (ref 0.00–0.50)

## 2024-03-07 LAB — TROPONIN T, HIGH SENSITIVITY: Troponin T High Sensitivity: <15 ng/L (ref 0–19)

## 2024-03-07 LAB — TSH: TSH: 1.4 u[IU]/mL (ref 0.350–4.500)

## 2024-03-07 LAB — MAGNESIUM: Magnesium: 2.5 mg/dL — ABNORMAL HIGH (ref 1.7–2.4)

## 2024-03-07 LAB — T4, FREE: Free T4: 0.76 ng/dL (ref 0.61–1.12)

## 2024-03-07 NOTE — Discharge Instructions (Addendum)
 EKG, chest x-ray and laboratory evaluation was overall reassuring.  For your blood pressure, follow-up with your primary care provider for continued outpatient management.  Regarding your palpitations, follow-up with your cardiologist.

## 2024-03-07 NOTE — ED Provider Notes (Signed)
 [place] [facility] AT [facility] Provider Note   CSN: [id number] Arrival date & time: 03/07/24  1446     Patient presents with: Palpitations and Hypertension   [name] is a 68 y.o. female.    Palpitations Hypertension     68 year old female with medical history significant for PFO, breast cancer in remission, fibromyalgia, heart palpitations not on any medication, HLD, HTN presenting to the emergency department with a chief complaint of palpitations.  Patient states that for the past few days she has had increasing frequency of palpitations.  She had previously worn a cardiac monitor and was found to have SVT 3 times.  She was offered medication but declined after following up in the clinic with her cardiologist.  Today she noticed that her blood pressure was 190/93.  She denies any headache, blurred vision, chest pain, abdominal pain, shortness of breath.  No focal neurologic deficits.  Prior to Admission medications   Medication Sig Start Date End Date Taking? Authorizing Provider  ALOE VERA JUICE PO Take 1-2 fluid ounces by mouth 3 (three) times daily with meals.    [provider]  amLODipine (NORVASC) 10 MG tablet Take 5 mg by mouth daily. 03/22/21   [provider]  Ascorbic Acid  (VITAMIN C ) 1000 MG tablet Take 1,000 mg by mouth daily.    [provider]  b complex vitamins capsule Take 1 capsule by mouth daily.    [provider]  colchicine 0.6 MG tablet Take 0.6 mg by mouth daily. 01/05/24   [provider]  Digestive Aids Mixture (BILE ACID FACTORS PO) Take 1 tablet by mouth 3 (three) times daily with meals.    [provider]  estradiol  (ESTRACE ) 0.01 % CREA vaginal cream Place 0.5 g vaginally 2 (two) times a week. Place 0.5g nightly for two weeks then twice a week after 02/25/24   Zuleta, Kaitlin G, NP  MAGNESIUM  CITRATE PO Take 1 capsule by mouth daily.    [provider]   melatonin 5 MG TABS Take 5 mg by mouth at bedtime.    [provider]  Multiple Vitamin (MULTIVITAMIN) tablet Take 1 tablet by mouth daily.    [provider]  NALTREXONE HCL PO Take 2 mg by mouth in the morning and at bedtime.    [provider]  NON FORMULARY Pt uses a c-pap nightly    [provider]  Omega-3 Fatty Acids (OMEGA 3 PO) Take 1 capsule by mouth daily.    [provider]  OVER THE COUNTER MEDICATION Take 1 capsule by mouth in the morning and at bedtime. Infla Med    [provider]  OVER THE COUNTER MEDICATION Take 2 capsules by mouth in the morning and at bedtime. Essential Pro    [provider]  OVER THE COUNTER MEDICATION Take 1 tablet by mouth daily. MagPlex    [provider]  oxyCODONE  (ROXICODONE ) 5 MG immediate release tablet Take 1 tablet (5 mg total) by mouth every 6 (six) hours as needed. 01/13/24 01/12/25  Curvin Mt III, MD  pantoprazole  (PROTONIX ) 40 MG tablet Take 1 tablet (40 mg total) by mouth daily. 12/09/23   Panosh, Wanda K, MD  potassium chloride  SA (KLOR-CON  M) 20 MEQ tablet TAKE ONE TABLET BY MOUTH ONCE A DAY Patient taking differently: Take 10 mEq by mouth daily. Takes 1/2 tablet daily 01/01/24   Panosh, Wanda K, MD  Probiotic Product (PROBIOTIC PO) Take 1 capsule by mouth daily.  [provider]  thyroid  (ARMOUR THYROID ) 90 MG tablet Take 45-90 mg by mouth See admin instructions. Take 45 mg by mouth every other day, alternating with 90 mg on alternate days    [provider]  VITAMIN D -VITAMIN K PO Take 2 tablets by mouth daily.    [provider]  ZINC GLUCONATE PO Take 10 mg by mouth daily.    [provider]    Allergies: Penicillins, Taxotere  [docetaxel ], Cefdinir, Cetirizine, Femara  [letrozole ], Pegfilgrastim , Irbesartan , Molds & smuts, and Tylenol  [acetaminophen ]    Review of Systems  Cardiovascular:  Positive for palpitations.  All other  systems reviewed and are negative.   Updated Vital Signs BP (!) 168/70   Pulse 73   Temp 98.1 F (36.7 C) (Oral)   Resp 17   LMP  (LMP Unknown)   SpO2 100%   Physical Exam Vitals and nursing note reviewed.  Constitutional:      General: She is not in acute distress.    Appearance: She is well-developed.  HENT:     Head: Normocephalic and atraumatic.  Eyes:     Conjunctiva/sclera: Conjunctivae normal.  Cardiovascular:     Rate and Rhythm: Normal rate and regular rhythm.  Pulmonary:     Effort: Pulmonary effort is normal. No respiratory distress.     Breath sounds: Normal breath sounds.  Abdominal:     Palpations: Abdomen is soft.     Tenderness: There is no abdominal tenderness.  Musculoskeletal:        General: No swelling.     Cervical back: Neck supple.  Skin:    General: Skin is warm and dry.     Capillary Refill: Capillary refill takes less than 2 seconds.  Neurological:     General: No focal deficit present.     Mental Status: She is alert. Mental status is at baseline.  Psychiatric:        Mood and Affect: Mood normal.     (all labs ordered are listed, but only abnormal results are displayed) Labs Reviewed  MAGNESIUM  - Abnormal; Notable for the following components:      Result Value   Magnesium  2.5 (*)    All other components within normal limits  CBC WITH DIFFERENTIAL/PLATELET  BASIC METABOLIC PANEL WITH GFR  D-DIMER, QUANTITATIVE  TSH  T4, FREE  TROPONIN T, HIGH SENSITIVITY    EKG: EKG Interpretation Date/Time:  Monday March 07 2024 14:53:23 EDT Ventricular Rate:  81 PR Interval:  168 QRS Duration:  104 QT Interval:  400 QTC Calculation: 464 R Axis:   -65  Text Interpretation: Normal sinus rhythm Left anterior fascicular block No significant change since last tracing Confirmed by Jerrol Agent ([id number]) on 03/07/2024 3:13:55 PM  Radiology: DG Chest Portable 1 View Result Date: 03/07/2024 CLINICAL DATA:  Palpitations, hypertension. EXAM:  PORTABLE CHEST 1 VIEW COMPARISON:  02/05/2022 FINDINGS: The cardiomediastinal contours are normal. The lungs are clear. Pulmonary vasculature is normal. No consolidation, pleural effusion, or pneumothorax. No acute osseous abnormalities are seen. IMPRESSION: No active disease. Electronically Signed   By: Andrea Gasman M.D.   On: 03/07/2024 18:00     Procedures   Medications Ordered in the ED - No data to display                                  Medical Decision Making Amount and/or Complexity of Data Reviewed Labs: ordered. Radiology: ordered.  68 year old female with medical history significant for PFO, breast cancer in remission, fibromyalgia, heart palpitations not on any medication, HLD, HTN presenting to the emergency department with a chief complaint of palpitations.  Patient states that for the past few days she has had increasing frequency of palpitations.  She had previously worn a cardiac monitor and was found to have SVT 3 times.  She was offered medication but declined after following up in the clinic with her cardiologist.  Today she noticed that her blood pressure was 190/93.  She denies any headache, blurred vision, chest pain, abdominal pain, shortness of breath.  No focal neurologic deficits.  On arrival, the patient was afebrile, not tachycardic or tachypneic, saturating well on room air, BP 169/94.  Patient on exam had an intact neurologic exam, lungs clear to auscultation bilaterally with no abdominal tenderness.  Presenting with concern for uncontrolled hypertension and heart palpitations.  Has previously worn a cardiac monitor for this and was diagnosed with SVT.  On telemetry, sinus rhythm noted.  Initial EKG revealed sinus rhythm, ventricular rate 81, no abnormal intervals or acute ischemic changes.  Workup for uncontrolled hypertension initiated and palpitations to include labs and imaging.  Chest x-ray: No acute cardiac or pulmonary disease.  Labs: D-dimer  negative, low concern for PE, magnesium  mildly elevated at 2.5, TSH normal, CBC without a leukocytosis or anemia, BMP unremarkable.  Cardiac troponin negative, patient without chest pain, do not think a repeat cardiac troponin is indicated at this time.  Cardiac telemetry was reviewed without any significant runs of SVT or significant abnormalities.  No significant electrolyte abnormality, thyroid  function normal, no evidence of endorgan damage in the setting of the patient's hypertension.  Patient with overall reassuring workup at this time.  Recommended outpatient follow-up with her PCP for her uncontrolled hypertension and outpatient follow-up with her cardiologist regarding her palpitations.  Overall stable for discharge at this time.     Final diagnoses:  Palpitations  Uncontrolled hypertension    ED Discharge Orders     None          Jerrol Agent, MD 03/07/24 2117

## 2024-03-07 NOTE — Telephone Encounter (Signed)
 FYI Only or Action Required?: FYI only for provider.  Patient was last seen in primary care on 02/29/2024 by Mercer Clotilda SAUNDERS, MD.  Called Nurse Triage reporting Palpitations.  Symptoms began several weeks ago.  Interventions attempted: Nothing.  Symptoms are: gradually worsening.  Triage Disposition: Go to ED Now (Notify PCP), See HCP Within 4 Hours (Or PCP Triage)  Patient/caregiver understands and will follow disposition?: Yes  Pt was walking in to UC while on the phone with RN.    Copied from CRM [id number]. Topic: Clinical - Red Word Triage >> Mar 07, 2024  2:34 PM Ashley R wrote: Red Word that prompted transfer to Nurse Triage: Heart palpitation, high BP but seems to be happening more often/worsening.  wants to drive to Urgent Care but unsure she can make it. Feeling cold/jittery. Reason for Disposition  [1] Skipped or extra beat(s) AND [2] occurs 4 or more times per minute  [1] Systolic BP >= 160 OR Diastolic >= 100 AND [2] cardiac (e.g., breathing difficulty, chest pain) or neurologic symptoms (e.g., new-onset blurred or double vision, unsteady gait)  Answer Assessment - Initial Assessment Questions Bp up this morning Now down 138/78 167/80 at physical therapy- 190/93- white coat syndrome Feels in bed even     1. DESCRIPTION: Please describe your heart rate or heartbeat that you are having (e.g., fast/slow, regular/irregular, skipped or extra beats, palpitations)     Heart flutter  2. ONSET: When did it start? (e.g., minutes, hours, days)      More often  3. DURATION: How long does it last (e.g., seconds, minutes, hours)     10 seconds  4. PATTERN Does it come and go, or has it been constant since it started?  Does it get worse with exertion?   Are you feeling it now?     intermittent 5. TAP: Using your hand, can you tap out what you are feeling on a chair or table in front of you, so that I can hear? Note: Not all patients can do this.        6.  HEART RATE: Can you tell me your heart rate? How many beats in 15 seconds?  Note: Not all patients can do this.        7. RECURRENT SYMPTOM: Have you ever had this before? If Yes, ask: When was the last time? and What happened that time?       8. CAUSE: What do you think is causing the palpitations?      9. CARDIAC HISTORY: Do you have any history of heart disease? (e.g., heart attack, angina, bypass surgery, angioplasty, arrhythmia)       10. OTHER SYMPTOMS: Do you have any other symptoms? (e.g., dizziness, chest pain, sweating, difficulty breathing)       Little choky bc upset.  11. PREGNANCY: Is there any chance you are pregnant? When was your last menstrual period?  Answer Assessment - Initial Assessment Questions PT called on her way to UC. Rn agreed with ER/UC as right place to be seen as she is having palpitations and the high BP and feels jittery. She states that she thinks it's anxiety because she's nervous and just talking to someone on the phone helped calm her. Pt isn't having palpitations currently but states they've been coming and going with no trigger.     1. BLOOD PRESSURE: What is your blood pressure? Did you take at least two measurements 5 minutes apart?     At PT it was  190/93 2. ONSET: When did you take your blood pressure?     This m at home it was high then it came down to 130's then before PT at home it was 160's at PT prior to working out it was 190's.  3. HOW: How did you take your blood pressure? (e.g., automatic home BP monitor, visiting nurse)     automatic 4. HISTORY: Do you have a history of high blood pressure?     Yes but never this high 5. MEDICINES: Are you taking any medicines for blood pressure? Have you missed any doses recently?     amlodipine 6. OTHER SYMPTOMS: Do you have any symptoms? (e.g., blurred vision, chest pain, difficulty breathing, headache, weakness)     Palpitations, jittery or shaky, doesn't feel  well or like self  Protocols used: Heart Rate and Heartbeat Questions-A-AH, Blood Pressure - High-A-AH

## 2024-03-07 NOTE — ED Notes (Signed)
 Pt given water  per request after checking with primary RN

## 2024-03-07 NOTE — ED Notes (Signed)
 ED Provider at bedside.

## 2024-03-07 NOTE — ED Triage Notes (Signed)
 Pt c/o wasn't feeling good, palpations x a few days, went to PT today & my BP was 190/93. Denies HA, blurred vision, CP, states she is just a little shaky.  Hx HTN, advises usually it's pretty well controlled.

## 2024-03-08 ENCOUNTER — Ambulatory Visit: Payer: Self-pay

## 2024-03-08 NOTE — Telephone Encounter (Signed)
 FYI Only or Action Required?: FYI only for provider.  Patient was last seen in primary care on 02/29/2024 by Mercer Clotilda SAUNDERS, MD.  Called Nurse Triage reporting Hypertension.  Symptoms began today.  Symptoms are: unchanged.  Triage Disposition: See Physician Within 24 Hours  Patient/caregiver understands and will follow disposition?: Yes     Copied from CRM [id number]. Topic: Clinical - Red Word Triage >> Mar 08, 2024  1:47 PM Brittany M wrote: Red Word that prompted transfer to Nurse Triage: BP high on 10/27- went to the ER- now it is reading at 180/98        Reason for Disposition  Systolic BP >= 180 OR Diastolic >= 110  Answer Assessment - Initial Assessment Questions 1. BLOOD PRESSURE: What is your blood pressure? Did you take at least two measurements 5 minutes apart?     180/98 2. ONSET: When did you take your blood pressure?     Today  3. HOW: How did you take your blood pressure? (e.g., automatic home BP monitor, visiting nurse)     Taken at a doctors office  4. HISTORY: Do you have a history of high blood pressure?     Yes 5. MEDICINES: Are you taking any medicines for blood pressure? Have you missed any doses recently?     Has not missed any doses  6. OTHER SYMPTOMS: Do you have any symptoms? (e.g., blurred vision, chest pain, difficulty breathing, headache, weakness)     I feel tired  Protocols used: Blood Pressure - High-A-AH

## 2024-03-09 ENCOUNTER — Encounter: Payer: Self-pay | Admitting: Internal Medicine

## 2024-03-09 ENCOUNTER — Ambulatory Visit (INDEPENDENT_AMBULATORY_CARE_PROVIDER_SITE_OTHER): Admitting: Internal Medicine

## 2024-03-09 VITALS — BP 152/86 | HR 80 | Temp 98.2°F | Ht 64.86 in | Wt 209.3 lb

## 2024-03-09 DIAGNOSIS — I10 Essential (primary) hypertension: Secondary | ICD-10-CM

## 2024-03-09 DIAGNOSIS — Z8679 Personal history of other diseases of the circulatory system: Secondary | ICD-10-CM | POA: Diagnosis not present

## 2024-03-09 DIAGNOSIS — Z79899 Other long term (current) drug therapy: Secondary | ICD-10-CM

## 2024-03-09 DIAGNOSIS — R002 Palpitations: Secondary | ICD-10-CM | POA: Diagnosis not present

## 2024-03-09 MED ORDER — AMLODIPINE BESYLATE 2.5 MG PO TABS: 2.5000 mg | ORAL_TABLET | Freq: Every day | ORAL | 1 refills | Status: AC

## 2024-03-09 NOTE — Progress Notes (Signed)
 Chief Complaint  Patient presents with   Hospitalization Follow-up    Follow up from  ED. Pt reports she had heart monitor 2 wks with cardiology.  BP reading on 03/07/24 153/80 before  PT visit. 190/93 check by PT.  166/93 with her BP cuff. BP yesterday 180/98 at intergrative visit.     HPI: [name] 68 y.o. come in forfu ed and lab review  doing fine today   Had ed visit for palpiatiations and elevated bp HT  currently taking 5 mg amlodipine oer day ( 1/2 of 10 mg)   Hx of svt on monitor not on meds  from cardiology in summer ( [facility])   Had neg evaluation  in ed for actice disease Also had labs via integrative doc  elevated  hs crp, alk phos   high b12   To see neuro about  feet sx poss neuropathy . Eating as healthy as possible and monitoring bp  puls usually in 70 + range  ROS: See pertinent positives and negatives per HPI.  Past Medical History:  Diagnosis Date   Anal fissure    Atrial septal aneurysm / if pfo  echo 6 13  10/23/2011   Breast cancer (HCC)    Cataract    both eyes   Depression    Dysrhythmia    Palpitations   Fatty liver    pre fatty liver   Fibromyalgia    Headache(784.0)    hx of migraines when younger   Heart palpitations    hx with normal holter event monitoring   Hx: UTI (urinary tract infection)    Hyperlipidemia    Hypertension    Hypothyroidism    Obesity    Personal history of chemotherapy    Personal history of radiation therapy    PFO (patent foramen ovale) 07/20/2017   PFO with right to left flow at rest on saline microcavitation study   Pneumonia 1972   hx of   Polyp of colon    Serrated adenoma of colon 08/2012   Skin cancer    basal, squamous cell   Sleep apnea    uses cpap    Family History  Problem Relation Age of Onset   Hypertension Mother        low borderline   Osteoporosis Mother    Hypertension Father    Liver disease Father        amyloid deceased   Hyperlipidemia Father    Melanoma Sister     Juvenile Diabetes Daughter    ADD / ADHD Child    Hyperlipidemia Other        Maternal grandmother   Colon cancer Neg Hx    Stomach cancer Neg Hx    Colon polyps Neg Hx    Esophageal cancer Neg Hx    Rectal cancer Neg Hx    Bladder Cancer Neg Hx    Uterine cancer Neg Hx    Renal cancer Neg Hx     Social History   Socioeconomic History   Marital status: Married    Spouse name: Not on file   Number of children: 6   Years of education: Not on file   Highest education level: Not on file  Occupational History   Occupation: TEACHER ASST    Employer: [employer]  Tobacco Use   Smoking status: Never   Smokeless tobacco: Never  Vaping Use   Vaping status: Never Used  Substance and Sexual Activity   Alcohol   use: Yes    Comment: rare   Drug use: No   Sexual activity: Yes    Partners: Male    Birth control/protection: Post-menopausal  Other Topics Concern   Not on file  Social History Narrative   Teachers Aide [employer]  Not workking since last February . 15 left shoulder surgery    Married   Special educ   HH of 4   3 outside dogs, 8 goats, 40 chickens and quail. [place]   Job stresses   Ex husband passes away   Not working after injur at school  Shoulder neck                Social Drivers of Health   Financial Resource Strain: Low Risk  (11/04/2023)   Overall Financial Resource Strain (CARDIA)    Difficulty of Paying Living Expenses: Not hard at all  Food Insecurity: No Food Insecurity (11/04/2023)   Hunger Vital Sign    Worried About Running Out of Food in the Last Year: Never true    Ran Out of Food in the Last Year: Never true  Transportation Needs: No Transportation Needs (11/04/2023)   PRAPARE - Administrator, Civil Service (Medical): No    Lack of Transportation (Non-Medical): No  Physical Activity: Insufficiently Active (11/04/2023)   Exercise Vital Sign    Days of Exercise per Week: 6 days    Minutes of  Exercise per Session: 20 min  Stress: No Stress Concern Present (11/04/2023)   [employer] of Occupational Health - Occupational Stress Questionnaire    Feeling of Stress: Not at all  Social Connections: Socially Integrated (11/04/2023)   Social Connection and Isolation Panel    Frequency of Communication with Friends and Family: More than three times a week    Frequency of Social Gatherings with Friends and Family: More than three times a week    Attends Religious Services: More than 4 times per year    Active Member of [employer] or Organizations: Yes    Attends Engineer, Structural: More than 4 times per year    Marital Status: Married    Outpatient Medications Prior to Visit  Medication Sig Dispense Refill   ALOE VERA JUICE PO Take 1-2 fluid ounces by mouth 3 (three) times daily with meals.     amLODipine (NORVASC) 10 MG tablet Take 5 mg by mouth daily.     Ascorbic Acid  (VITAMIN C ) 1000 MG tablet Take 1,000 mg by mouth daily.     b complex vitamins capsule Take 1 capsule by mouth daily.     colchicine 0.6 MG tablet Take 0.6 mg by mouth daily. (Patient taking differently: Take 0.6 mg by mouth daily. Taking 1/2 tablet Monday, Wednesday, Friday.)     Digestive Aids Mixture (BILE ACID FACTORS PO) Take 1 tablet by mouth 3 (three) times daily with meals.     MAGNESIUM  CITRATE PO Take 1 capsule by mouth daily.     melatonin 5 MG TABS Take 5 mg by mouth at bedtime.     Multiple Vitamin (MULTIVITAMIN) tablet Take 1 tablet by mouth daily.     NALTREXONE HCL PO Take 2 mg by mouth in the morning and at bedtime. (Patient taking differently: Take 2 mg by mouth in the morning and at bedtime. Starting this Thursday)     NON FORMULARY Pt uses a c-pap nightly     Omega-3 Fatty Acids (OMEGA 3 PO) Take 1 capsule  by mouth daily.     OVER THE COUNTER MEDICATION Take 1 capsule by mouth in the morning and at bedtime. Infla Med     potassium chloride  SA (KLOR-CON  M) 20 MEQ tablet TAKE ONE TABLET BY  MOUTH ONCE A DAY (Patient taking differently: Take 10 mEq by mouth daily. Takes 1/2 tablet daily) 90 tablet 0   Probiotic Product (PROBIOTIC PO) Take 1 capsule by mouth daily.     thyroid  (ARMOUR THYROID ) 90 MG tablet Take 45-90 mg by mouth See admin instructions. Take 45 mg by mouth every other day, alternating with 90 mg on alternate days     VITAMIN D -VITAMIN K PO Take 2 tablets by mouth daily.     ZINC GLUCONATE PO Take 10 mg by mouth daily.     estradiol  (ESTRACE ) 0.01 % CREA vaginal cream Place 0.5 g vaginally 2 (two) times a week. Place 0.5g nightly for two weeks then twice a week after (Patient not taking: Reported on 03/09/2024) 42 g 11   OVER THE COUNTER MEDICATION Take 2 capsules by mouth in the morning and at bedtime. Essential Pro     OVER THE COUNTER MEDICATION Take 1 tablet by mouth daily. MagPlex     oxyCODONE  (ROXICODONE ) 5 MG immediate release tablet Take 1 tablet (5 mg total) by mouth every 6 (six) hours as needed. (Patient not taking: Reported on 03/09/2024) 15 tablet 0   pantoprazole  (PROTONIX ) 40 MG tablet Take 1 tablet (40 mg total) by mouth daily. (Patient not taking: Reported on 03/09/2024) 30 tablet 0   No facility-administered medications prior to visit.     EXAM:  BP (!) 152/86 (BP Location: Left Arm, Patient Position: Sitting, Cuff Size: Large)   Pulse 80   Temp 98.2 F (36.8 C) (Oral)   Ht 5' 4.86 (1.647 m)   Wt 209 lb 4.8 oz (94.9 kg)   LMP  (LMP Unknown)   SpO2 97%   BMI 34.98 kg/m   Body mass index is 34.98 kg/m.  GENERAL: vitals reviewed and listed above, alert, oriented, appears well hydrated and in no acute distress HEENT: atraumatic, conjunctiva  clear, no obvious abnormalities on inspection of external nose and ears NECK: no obvious masses on inspection palpation  CV: HRRR, no clubbing cyanosis  no g or m heard nl cap refill  MS: moves all extremities without noticeable focal  abnormality PSYCH: pleasant and cooperative, no obvious depression  or anxiety Lab Results  Component Value Date   WBC 8.9 03/07/2024   HGB 13.6 03/07/2024   HCT 40.8 03/07/2024   PLT 215 03/07/2024   GLUCOSE 94 03/07/2024   CHOL 226 (A) 06/05/2020   TRIG 56 08/05/2019   HDL 57 06/05/2020   LDLDIRECT 165.2 12/27/2012   LDLCALC 156 06/05/2020   ALT 36 01/07/2024   AST 34 01/07/2024   NA 142 03/07/2024   K 3.8 03/07/2024   CL 105 03/07/2024   CREATININE 0.82 03/07/2024   BUN 16 03/07/2024   CO2 27 03/07/2024   TSH 1.400 03/07/2024   HGBA1C 5.2 06/01/2013   BP Readings from Last 3 Encounters:  03/09/24 (!) 152/86  03/07/24 (!) 168/70  02/29/24 (!) 146/80   Ed review and cards brief not from July  ASSESSMENT AND PLAN:  Discussed the following assessment and plan:  Essential hypertension  Palpitations  Medication management  History of supraventricular tachycardia  options  to continue lsi  but bp   med intensification advised  she is cautious about meds  Option to try inc amlodipine to 7.5 per day   Consideration of a med like carvedilol  because of hx of poss svt.  But many options   Make appt with her cardiologist to help make a decision about further meds  benefit risk ( I think there were issues with diuretic in past)  Interesting that son had  ablation for ? svt -Patient advised to return or notify health care team  if  new concerns arise. Record review discussion and  med plan fu 32 minutes   Patient Instructions  Lets try   total 7.5 mg per day amlodipine  Get appt with your cardiologist ; consideration of med like carvedilol  can help bp and suppress pulse( svt) .  Send in BP readings after a month or so .   Rhema Boyett K. Brandin Stetzer M.D.

## 2024-03-09 NOTE — Telephone Encounter (Signed)
 Pt was seen today.

## 2024-03-09 NOTE — Patient Instructions (Addendum)
 Lets try   total 7.5 mg per day amlodipine  Get appt with your cardiologist ; consideration of med like carvedilol  can help bp and suppress pulse( svt) .  Send in BP readings after a month or so .

## 2024-03-11 ENCOUNTER — Other Ambulatory Visit: Payer: Self-pay

## 2024-03-11 DIAGNOSIS — Z170 Estrogen receptor positive status [ER+]: Secondary | ICD-10-CM

## 2024-03-14 ENCOUNTER — Encounter: Payer: Self-pay | Admitting: Nurse Practitioner

## 2024-03-14 ENCOUNTER — Inpatient Hospital Stay: Admitting: Nurse Practitioner

## 2024-03-14 ENCOUNTER — Inpatient Hospital Stay: Attending: Nurse Practitioner

## 2024-03-14 ENCOUNTER — Telehealth: Payer: Self-pay | Admitting: Nurse Practitioner

## 2024-03-14 VITALS — BP 160/100 | HR 86 | Temp 97.8°F | Resp 17 | Wt 208.3 lb

## 2024-03-14 DIAGNOSIS — Z860101 Personal history of adenomatous and serrated colon polyps: Secondary | ICD-10-CM | POA: Insufficient documentation

## 2024-03-14 DIAGNOSIS — I10 Essential (primary) hypertension: Secondary | ICD-10-CM | POA: Diagnosis not present

## 2024-03-14 DIAGNOSIS — Z79899 Other long term (current) drug therapy: Secondary | ICD-10-CM | POA: Insufficient documentation

## 2024-03-14 DIAGNOSIS — C50411 Malignant neoplasm of upper-outer quadrant of right female breast: Secondary | ICD-10-CM | POA: Diagnosis not present

## 2024-03-14 DIAGNOSIS — Z85828 Personal history of other malignant neoplasm of skin: Secondary | ICD-10-CM | POA: Diagnosis not present

## 2024-03-14 DIAGNOSIS — M797 Fibromyalgia: Secondary | ICD-10-CM | POA: Diagnosis not present

## 2024-03-14 DIAGNOSIS — E785 Hyperlipidemia, unspecified: Secondary | ICD-10-CM | POA: Insufficient documentation

## 2024-03-14 DIAGNOSIS — Z923 Personal history of irradiation: Secondary | ICD-10-CM | POA: Insufficient documentation

## 2024-03-14 DIAGNOSIS — D0512 Intraductal carcinoma in situ of left breast: Secondary | ICD-10-CM | POA: Insufficient documentation

## 2024-03-14 DIAGNOSIS — K760 Fatty (change of) liver, not elsewhere classified: Secondary | ICD-10-CM | POA: Diagnosis not present

## 2024-03-14 DIAGNOSIS — G4730 Sleep apnea, unspecified: Secondary | ICD-10-CM | POA: Insufficient documentation

## 2024-03-14 DIAGNOSIS — Z79811 Long term (current) use of aromatase inhibitors: Secondary | ICD-10-CM | POA: Insufficient documentation

## 2024-03-14 DIAGNOSIS — M8580 Other specified disorders of bone density and structure, unspecified site: Secondary | ICD-10-CM | POA: Insufficient documentation

## 2024-03-14 DIAGNOSIS — Z170 Estrogen receptor positive status [ER+]: Secondary | ICD-10-CM | POA: Diagnosis not present

## 2024-03-14 DIAGNOSIS — Z9221 Personal history of antineoplastic chemotherapy: Secondary | ICD-10-CM | POA: Insufficient documentation

## 2024-03-14 DIAGNOSIS — E039 Hypothyroidism, unspecified: Secondary | ICD-10-CM | POA: Diagnosis not present

## 2024-03-14 DIAGNOSIS — Z1721 Progesterone receptor positive status: Secondary | ICD-10-CM | POA: Insufficient documentation

## 2024-03-14 DIAGNOSIS — E669 Obesity, unspecified: Secondary | ICD-10-CM | POA: Insufficient documentation

## 2024-03-14 DIAGNOSIS — Z1732 Human epidermal growth factor receptor 2 negative status: Secondary | ICD-10-CM | POA: Diagnosis not present

## 2024-03-14 LAB — CMP (CANCER CENTER ONLY)
ALT: 32 U/L (ref 0–44)
AST: 27 U/L (ref 15–41)
Albumin: 4.5 g/dL (ref 3.5–5.0)
Alkaline Phosphatase: 130 U/L — ABNORMAL HIGH (ref 38–126)
Anion gap: 6 (ref 5–15)
BUN: 20 mg/dL (ref 8–23)
CO2: 28 mmol/L (ref 22–32)
Calcium: 9.7 mg/dL (ref 8.9–10.3)
Chloride: 107 mmol/L (ref 98–111)
Creatinine: 0.71 mg/dL (ref 0.44–1.00)
GFR, Estimated: >60 mL/min (ref >60)
Glucose, Bld: 94 mg/dL (ref 70–99)
Potassium: 4.5 mmol/L (ref 3.5–5.1)
Sodium: 141 mmol/L (ref 135–145)
Total Bilirubin: 0.6 mg/dL (ref 0.0–1.2)
Total Protein: 7.6 g/dL (ref 6.5–8.1)

## 2024-03-14 LAB — CBC WITH DIFFERENTIAL (CANCER CENTER ONLY)
Abs Immature Granulocytes: 0.02 K/uL (ref 0.00–0.07)
Basophils Absolute: 0.1 K/uL (ref 0.0–0.1)
Basophils Relative: 1 %
Eosinophils Absolute: 0.2 K/uL (ref 0.0–0.5)
Eosinophils Relative: 2 %
HCT: 42.6 % (ref 36.0–46.0)
Hemoglobin: 14.5 g/dL (ref 12.0–15.0)
Immature Granulocytes: 0 %
Lymphocytes Relative: 25 %
Lymphs Abs: 1.7 K/uL (ref 0.7–4.0)
MCH: 30.2 pg (ref 26.0–34.0)
MCHC: 34 g/dL (ref 30.0–36.0)
MCV: 88.8 fL (ref 80.0–100.0)
Monocytes Absolute: 0.5 K/uL (ref 0.1–1.0)
Monocytes Relative: 7 %
Neutro Abs: 4.4 K/uL (ref 1.7–7.7)
Neutrophils Relative %: 65 %
Platelet Count: 218 K/uL (ref 150–400)
RBC: 4.8 MIL/uL (ref 3.87–5.11)
RDW: 13.9 % (ref 11.5–15.5)
WBC Count: 6.7 K/uL (ref 4.0–10.5)
nRBC: 0 % (ref 0.0–0.2)

## 2024-03-14 LAB — VITAMIN B12: Vitamin B-12: 1462 pg/mL — ABNORMAL HIGH (ref 180–914)

## 2024-03-14 NOTE — Progress Notes (Signed)
 [facility]   [phone] [phone]    Patient Care Team: Panosh, Apolinar POUR, MD as PCP - General Leva Rush, MD as Consulting Physician (Obstetrics and Gynecology) Dr DELENA Duwayne Purchase, MD as Consulting Physician (Orthopedic Surgery) Aneita Gwendlyn DASEN, MD (Inactive) as Consulting Physician (Gastroenterology) Burnetta Aures, MD as Consulting Physician (Orthopedic Surgery) Tyree Nanetta SAILOR, RN as Oncology Nurse Navigator Curvin Deward MOULD, MD as Consulting Physician (General Surgery) Lanny Callander, MD as Consulting Physician (Hematology) Izell Domino, MD as Attending Physician (Radiation Oncology) Robinson Pao, MD as Consulting Physician (Dermatology) Pascal Alm PARAS, MD as Referring Physician (Cardiology) Ann Mayme POUR, NP as Nurse Practitioner (Nurse Practitioner) Lionell Jon DEL, [facility] (Pharmacist)   CHIEF COMPLAINT: Follow-up breast cancer  Oncology History Overview Note  Cancer Staging Ductal carcinoma in situ (DCIS) of left breast Staging form: Breast, AJCC 8th Edition - Clinical stage from 06/21/2020: Stage 0 (cTis (DCIS), cN0, cM0, G2, ER+, PR+, HER2: Not Assessed) - Signed by Lanny Callander, MD on 06/26/2020 Stage prefix: Initial diagnosis  Malignant neoplasm of upper-outer quadrant of right breast in female, estrogen receptor positive (HCC) Staging form: Breast, AJCC 8th Edition - Clinical stage from 06/21/2020: Stage IA (cT1b, cN0, cM0, G2, ER+, PR+, HER2-) - Signed by Lanny Callander, MD on 06/26/2020 Stage prefix: Initial diagnosis - Pathologic stage from 07/26/2020: Stage IA (pT1c, pN1a, cM0, G2, ER+, PR+, HER2-) - Signed by Lanny Callander, MD on 08/09/2020 Stage prefix: Initial diagnosis Nuclear grade: G2 Histologic grading system: 3 grade system Residual tumor (R): R0 - None    Malignant neoplasm of upper-outer quadrant of right breast in female, estrogen receptor positive (HCC)  06/08/2020 Mammogram   IMPRESSION: 1. 9 x 7 x 6 mm mass in the 12  o'clock position of the right breast, 2cmfn with imaging features highly suspicious for malignancy. 2. 4 mm group of indeterminate calcifications in the 12 o'clock position of the left breast and 4 mm group of indeterminate calcifications in the 1 o'clock position of the left breast. Together, the groups span an area measuring 3.9 cm.   06/21/2020 Cancer Staging   Staging form: Breast, AJCC 8th Edition - Clinical stage from 06/21/2020: Stage IA (cT1b, cN0, cM0, G2, ER+, PR+, HER2-) - Signed by Lanny Callander, MD on 06/26/2020 Stage prefix: Initial diagnosis   06/21/2020 Initial Biopsy   Diagnosis 1. Breast, right, needle core biopsy, 12 oc - INVASIVE MAMMARY CARCINOMA - MAMMARY CARCINOMA IN SITU - SEE COMMENT 2. Breast, left, needle core biopsy, 12 oc - MAMMARY CARCINOMA IN-SITU WITH NECROSIS AND CALCIFICATIONS - SEE COMMENT 3. Breast, left, needle core biopsy, 1 oc - MAMMARY CARCINOMA IN-SITU WITH NECROSIS AND CALCIFICATIONS - SEE COMMENT Microscopic Comment 1. The biopsy material shows an infiltrative proliferation of cells with large vesicular nuclei with inconspicuous nucleoli, arranged linearly and in small clusters. Based on the biopsy, the carcinoma appears Nottingham grade 2 of 3.  Addendum: 1. E-cadherin is POSITIVE supporting a ductal origin. 2. E-cadherin is POSITIVE supporting a ductal origin.  3. E-cadherin is positive supporting a ductal origin. The focus is less pronounced on the deeper sections and in isolation would likely be considered atypical ductal hyperplasia.   06/21/2020 Receptors her2   1. PROGNOSTIC INDICATORS Results: IMMUNOHISTOCHEMICAL AND MORPHOMETRIC ANALYSIS PERFORMED MANUALLY The tumor cells are EQUIVOCAL for Her2 (2+). HER2 by FISH will be performed and the results reported separately Estrogen Receptor: 95%, POSITIVE, STRONG STAINING INTENSITY Progesterone  Receptor: 40%, POSITIVE, MODERATE STAINING INTENSITY  Proliferation Marker Ki67: 10%  1.  FLUORESCENCE IN-SITU HYBRIDIZATION Results: GROUP 5: HER2 **NEGATIVE** Equivocal form of amplification of the HER2 gene was detected in the IHC 2+ tissue sample received from this individual. HER2 FISH was performed by a technologist and cell imaging and analysis on the [employer].   06/25/2020 Initial Diagnosis   Malignant neoplasm of upper-outer quadrant of right breast in female, estrogen receptor positive (HCC)   07/03/2020 Breast MRI   IMPRESSION: 1. Known RIGHT breast cancer, 12 o'clock axis, at anterior depth, measuring 1 cm greatest extent, manifesting as a spiculated enhancing mass on MRI, with associated biopsy clip. Expected post biopsy changes are seen within the adjacent outer RIGHT breast. 2. No evidence of additional multifocal or multicentric disease within the RIGHT breast. 3. Known LEFT breast DCIS within the slightly outer LEFT breast, at anterior depth, corresponding to the biopsy site labeled 1 o'clock axis, with associated enhancement only at the margins of the biopsy cavity measuring up to 5 mm greatest dimension. 4. Known LEFT breast DCIS within the upper central LEFT breast, at middle depth, corresponding to the biopsy site labeled 12 o'clock axis. Contiguous linear non-mass enhancement extends 2.3 cm superior-medial to the biopsy cavity, most likely post biopsy change but possibly contiguous extent of disease. 5. No evidence of additional multifocal or multicentric disease within the LEFT breast. 6. No evidence of metastatic lymphadenopathy.     07/26/2020 Cancer Staging   Staging form: Breast, AJCC 8th Edition - Pathologic stage from 07/26/2020: Stage IA (pT1c, pN1a, cM0, G2, ER+, PR+, HER2-) - Signed by Lanny Callander, MD on 08/09/2020 Stage prefix: Initial diagnosis Nuclear grade: G2 Histologic grading system: 3 grade system Residual tumor (R): R0 - None   07/26/2020 Surgery   RIGHT BREAST LUMPECTOMY WITH RADIOACTIVE SEED AND SENTINEL LYMPH NODE BIOPSY by Dr Curvin    07/26/2020 Pathology Results   FINAL MICROSCOPIC DIAGNOSIS:   A. LYMPH NODE, RIGHT AXILLARY #1, SENTINEL, EXCISION:  - Lymph node, negative for carcinoma (0/1)   B. LYMPH NODE, RIGHT AXILLARY, SENTINEL, EXCISION:  - Benign fibroadipose tissue, negative for carcinoma   C. BREAST, RIGHT, LUMPECTOMY:  - Invasive ductal carcinoma, 1.5 cm, grade 2  - Ductal carcinoma in situ, low grade  - Resection margins are negative for carcinoma; closest is the anterior margin of 0.2 cm  - Biopsy site changes  - See oncology table   D. BREAST, LEFT, LUMPECTOMY:  - Benign breast parenchyma with prominent biopsy-related changes  - Negative for residual ductal carcinoma in situ  - See oncology table   E. LYMPH NODE, RIGHT AXILLARY #2, SENTINEL, EXCISION:  - Invasive ductal carcinoma, see comment  COMMENT:  E.  Lymph node tissue is not identified.  Findings likely represent an entirely replaced lymph node with foci of extranodal extension.    07/26/2020 Miscellaneous   Mammaprint High Risk of Luminla Type B 29% risk of recurrence in 10 years if untreated.  Her Mammaprint index is -0.175 She has 94.6% benefit of chemotherapy and hormaonal therapy.      08/20/2020 Imaging   CT C/A/P IMPRESSION: 1. No definitive imaging findings to suggest metastatic disease in the chest, abdomen or pelvis. 2. Postoperative changes of bilateral lumpectomy and right axillary lymph node dissection with what appears to be a large postoperative seroma in the right axilla, as detailed above. Attention on follow-up studies is recommended to ensure the stability or regression of this collection. 3. Additional incidental findings, as above.   08/28/2020 Imaging  Bone Scan IMPRESSION: No definite scintigraphic evidence of osseous metastases.   08/30/2020 - 11/02/2020 Chemotherapy   Adjuvant Docetaxel  and Cytoxan  q3weeks for 4 cycles starting 08/30/20. Given skin rash, changes taxol  to Abraxane  starting with C2  (09/21/20). Completed 11/02/20.    12/03/2020 - 01/15/2021 Radiation Therapy   Bilateral breast radiation and right regional lymph nodes   02/2021 -  Anti-estrogen oral therapy   Adjuvant exemestane    04/11/2021 Survivorship   SCP delivered by Kyo Cocuzza, NP   Ductal carcinoma in situ (DCIS) of left breast  06/08/2020 Mammogram   IMPRESSION: 1. 9 x 7 x 6 mm mass in the 12 o'clock position of the right breast, 2cmfn with imaging features highly suspicious for malignancy. 2. 4 mm group of indeterminate calcifications in the 12 o'clock position of the left breast and 4 mm group of indeterminate calcifications in the 1 o'clock position of the left breast. Together, the groups span an area measuring 3.9 cm.   06/21/2020 Cancer Staging   Staging form: Breast, AJCC 8th Edition - Clinical stage from 06/21/2020: Stage 0 (cTis (DCIS), cN0, cM0, G2, ER+, PR+, HER2: Not Assessed) - Signed by Lanny Callander, MD on 06/26/2020 Stage prefix: Initial diagnosis   06/21/2020 Initial Biopsy   Diagnosis 1. Breast, right, needle core biopsy, 12 oc - INVASIVE MAMMARY CARCINOMA - MAMMARY CARCINOMA IN SITU - SEE COMMENT 2. Breast, left, needle core biopsy, 12 oc - MAMMARY CARCINOMA IN-SITU WITH NECROSIS AND CALCIFICATIONS - SEE COMMENT 3. Breast, left, needle core biopsy, 1 oc - MAMMARY CARCINOMA IN-SITU WITH NECROSIS AND CALCIFICATIONS - SEE COMMENT Microscopic Comment 1. The biopsy material shows an infiltrative proliferation of cells with large vesicular nuclei with inconspicuous nucleoli, arranged linearly and in small clusters. Based on the biopsy, the carcinoma appears Nottingham grade 2 of 3.  Addendum: 1. E-cadherin is POSITIVE supporting a ductal origin. 2. E-cadherin is POSITIVE supporting a ductal origin.  3. E-cadherin is positive supporting a ductal origin. The focus is less pronounced on the deeper sections and in isolation would likely be considered atypical ductal hyperplasia.   06/21/2020  Receptors her2   2. PROGNOSTIC INDICATORS Results: IMMUNOHISTOCHEMICAL AND MORPHOMETRIC ANALYSIS PERFORMED MANUALLY Estrogen Receptor: 95%, POSITIVE, STRONG STAINING INTENSITY Progesterone  Receptor: 30%, POSITIVE, STRONG STAINING INTENSITY   06/25/2020 Initial Diagnosis   Ductal carcinoma in situ (DCIS) of left breast   07/03/2020 Breast MRI   IMPRESSION: 1. Known RIGHT breast cancer, 12 o'clock axis, at anterior depth, measuring 1 cm greatest extent, manifesting as a spiculated enhancing mass on MRI, with associated biopsy clip. Expected post biopsy changes are seen within the adjacent outer RIGHT breast. 2. No evidence of additional multifocal or multicentric disease within the RIGHT breast. 3. Known LEFT breast DCIS within the slightly outer LEFT breast, at anterior depth, corresponding to the biopsy site labeled 1 o'clock axis, with associated enhancement only at the margins of the biopsy cavity measuring up to 5 mm greatest dimension. 4. Known LEFT breast DCIS within the upper central LEFT breast, at middle depth, corresponding to the biopsy site labeled 12 o'clock axis. Contiguous linear non-mass enhancement extends 2.3 cm superior-medial to the biopsy cavity, most likely post biopsy change but possibly contiguous extent of disease. 5. No evidence of additional multifocal or multicentric disease within the LEFT breast. 6. No evidence of metastatic lymphadenopathy.     07/26/2020 Surgery   LEFT BREAST LUMPECTOMY X 2  WITH RADIOACTIVE SEED LOCALIZATION by Dr Curvin   07/26/2020 Pathology Results   FINAL  MICROSCOPIC DIAGNOSIS:   A. LYMPH NODE, RIGHT AXILLARY #1, SENTINEL, EXCISION:  - Lymph node, negative for carcinoma (0/1)   B. LYMPH NODE, RIGHT AXILLARY, SENTINEL, EXCISION:  - Benign fibroadipose tissue, negative for carcinoma   C. BREAST, RIGHT, LUMPECTOMY:  - Invasive ductal carcinoma, 1.5 cm, grade 2  - Ductal carcinoma in situ, low grade  - Resection margins are negative  for carcinoma; closest is the anterior margin of 0.2 cm  - Biopsy site changes  - See oncology table   D. BREAST, LEFT, LUMPECTOMY:  - Benign breast parenchyma with prominent biopsy-related changes  - Negative for residual ductal carcinoma in situ  - See oncology table   E. LYMPH NODE, RIGHT AXILLARY #2, SENTINEL, EXCISION:  - Invasive ductal carcinoma, see comment  COMMENT:  E.  Lymph node tissue is not identified.  Findings likely represent an entirely replaced lymph node with foci of extranodal extension.    12/03/2020 - 01/15/2021 Radiation Therapy   Bilateral breast radiation and right regional lymph nodes   02/2021 -  Anti-estrogen oral therapy   Adjuvant exemestane    04/11/2021 Survivorship   SCP delivered by Shiane Wenberg, NP      CURRENT THERAPY: Surveillance  INTERVAL HISTORY Ms. Chadderdon returns for routine visit, last seen by me a few weeks ago.  She went to the ER for palpitations and high blood pressure in the interim, PCP increased amlodipine and she plans to call cardiology.  She was told the small nodule in her neck is part of the bone.  Breasts remain tender right > left without other changes.  She wants to hold vaginal estrogen for now.  ROS  All other systems reviewed and negative  Past Medical History:  Diagnosis Date   Anal fissure    Atrial septal aneurysm / if pfo  echo 6 13  10/23/2011   Breast cancer (HCC)    Cataract    both eyes   Depression    Dysrhythmia    Palpitations   Fatty liver    pre fatty liver   Fibromyalgia    Headache(784.0)    hx of migraines when younger   Heart palpitations    hx with normal holter event monitoring   Hx: UTI (urinary tract infection)    Hyperlipidemia    Hypertension    Hypothyroidism    Obesity    Personal history of chemotherapy    Personal history of radiation therapy    PFO (patent foramen ovale) 07/20/2017   PFO with right to left flow at rest on saline microcavitation study   Pneumonia 1972    hx of   Polyp of colon    Serrated adenoma of colon 08/2012   Skin cancer    basal, squamous cell   Sleep apnea    uses cpap     Past Surgical History:  Procedure Laterality Date   BREAST LUMPECTOMY     BREAST LUMPECTOMY WITH RADIOACTIVE SEED AND SENTINEL LYMPH NODE BIOPSY Right 07/26/2020   Procedure: RIGHT BREAST LUMPECTOMY WITH RADIOACTIVE SEED AND SENTINEL LYMPH NODE BIOPSY;  Surgeon: Curvin Deward MOULD, MD;  Location: [facility] OR;  Service: General;  Laterality: Right;   BREAST LUMPECTOMY WITH RADIOACTIVE SEED LOCALIZATION Left 07/26/2020   Procedure: LEFT BREAST LUMPECTOMY X 2  WITH RADIOACTIVE SEED LOCALIZATION;  Surgeon: Curvin Deward MOULD, MD;  Location: [facility] OR;  Service: General;  Laterality: Left;   CHOLECYSTECTOMY N/A 01/13/2024   Procedure: LAPAROSCOPIC CHOLECYSTECTOMY;  Surgeon: Curvin Deward MOULD, MD;  Location:  [facility] OR;  Service: General;  Laterality: N/A;   COLONOSCOPY     DILATION AND CURETTAGE OF UTERUS  1978   DRUG INDUCED ENDOSCOPY     ECTOPIC PREGNANCY SURGERY  1983   KNEE ARTHROSCOPY     both in past   POLYPECTOMY     SHOULDER OPEN ROTATOR CUFF REPAIR Left 07/07/2013   Procedure: LEFT SHOULDER MINI OPEN SUBACROMIAL DECOMPRESSION ROTATOR CUFF REPAIR AND POSSIBLE PATCH GRAFT ;  Surgeon: Reyes JAYSON Billing, MD;  Location: [facility] ORS;  Service: Orthopedics;  Laterality: Left;  with interscaline block   SKIN CANCER EXCISION Bilateral    arm, legs, and chest   TONSILLECTOMY       Outpatient Encounter Medications as of 03/14/2024  Medication Sig Note   ALOE VERA JUICE PO Take 1-2 fluid ounces by mouth 3 (three) times daily with meals.    amLODipine (NORVASC) 10 MG tablet Take 5 mg by mouth daily.    amLODipine (NORVASC) 2.5 MG tablet Take 1 tablet (2.5 mg total) by mouth daily. Can take with  5 mg to make 7.5 mg per day    Ascorbic Acid  (VITAMIN C ) 1000 MG tablet Take 1,000 mg by mouth daily.    b complex vitamins capsule Take 1 capsule by mouth daily.    colchicine 0.6 MG tablet Take 0.6 mg  by mouth daily. (Patient taking differently: Take 0.6 mg by mouth daily. Taking 1/2 tablet Monday, Wednesday, Friday.) 01/06/2024: Has not started yet   Digestive Aids Mixture (BILE ACID FACTORS PO) Take 1 tablet by mouth 3 (three) times daily with meals.    estradiol  (ESTRACE ) 0.01 % CREA vaginal cream Place 0.5 g vaginally 2 (two) times a week. Place 0.5g nightly for two weeks then twice a week after    MAGNESIUM  CITRATE PO Take 1 capsule by mouth daily.    melatonin 5 MG TABS Take 5 mg by mouth at bedtime.    Multiple Vitamin (MULTIVITAMIN) tablet Take 1 tablet by mouth daily.    NALTREXONE HCL PO Take 2 mg by mouth in the morning and at bedtime. (Patient taking differently: Take 2 mg by mouth in the morning and at bedtime. Starting this Thursday)    NON FORMULARY Pt uses a c-pap nightly    Omega-3 Fatty Acids (OMEGA 3 PO) Take 1 capsule by mouth daily.    OVER THE COUNTER MEDICATION Take 1 capsule by mouth in the morning and at bedtime. Infla Med    OVER THE COUNTER MEDICATION Take 2 capsules by mouth in the morning and at bedtime. Essential Pro    OVER THE COUNTER MEDICATION Take 1 tablet by mouth daily. MagPlex    oxyCODONE  (ROXICODONE ) 5 MG immediate release tablet Take 1 tablet (5 mg total) by mouth every 6 (six) hours as needed.    pantoprazole  (PROTONIX ) 40 MG tablet Take 1 tablet (40 mg total) by mouth daily.    potassium chloride  SA (KLOR-CON  M) 20 MEQ tablet TAKE ONE TABLET BY MOUTH ONCE A DAY (Patient taking differently: Take 10 mEq by mouth daily. Takes 1/2 tablet daily)    Probiotic Product (PROBIOTIC PO) Take 1 capsule by mouth daily.    thyroid  (ARMOUR THYROID ) 90 MG tablet Take 45-90 mg by mouth See admin instructions. Take 45 mg by mouth every other day, alternating with 90 mg on alternate days 03/09/2024: 90 mg Monday thru Friday. 45 mg Saturday and Sunday.    VITAMIN D -VITAMIN K PO Take 2 tablets by mouth daily.  ZINC GLUCONATE PO Take 10 mg by mouth daily.    No  facility-administered encounter medications on file as of 03/14/2024.     Today's Vitals   03/14/24 1017 03/14/24 1021 03/14/24 1038  BP: (!) 220/100 (!) 180/100 (!) 160/100  Pulse:  86   Resp:  17   Temp: 97.8 F (36.6 C) 97.8 F (36.6 C)   TempSrc: Temporal Temporal   SpO2:  97%   Weight: 208 lb 4.8 oz (94.5 kg)    PainSc:   0-No pain   Body mass index is 34.81 kg/m.   PHYSICAL EXAM GENERAL:alert, no distress and comfortable SKIN: no rash  EYES: sclera clear NECK: without mass LYMPH: Small mobile nontender left submandibular/high cervical lymph node. no palpable supraclavicular or axillary lymphadenopathy  LUNGS: normal breathing effort HEART: no lower extremity edema ABDOMEN: abdomen soft, non-tender and normal bowel sounds NEURO: alert & oriented x 3 with fluent speech, no focal motor/sensory deficits Breast exam: No nipple discharge or inversion.  S/p bilateral lumpectomies, incisions completely healed.  Diffusely tender without palpable mass or nodularity in either breast or axilla that I could appreciate   CBC    Latest Ref Rng & Units 03/14/2024    9:57 AM 03/07/2024    4:28 PM 01/07/2024   11:23 AM  CBC  WBC 4.0 - 10.5 K/uL 6.7  8.9  7.8   Hemoglobin 12.0 - 15.0 g/dL 85.4  86.3  86.3   Hematocrit 36.0 - 46.0 % 42.6  40.8  42.1   Platelets 150 - 400 K/uL 218  215  218       CMP     Latest Ref Rng & Units 03/14/2024    9:57 AM 03/07/2024    4:28 PM 01/07/2024   11:23 AM  CMP  Glucose 70 - 99 mg/dL 94  94  94   BUN 8 - 23 mg/dL 20  16  17    Creatinine 0.44 - 1.00 mg/dL 9.28  9.17  9.25   Sodium 135 - 145 mmol/L 141  142  140   Potassium 3.5 - 5.1 mmol/L 4.5  3.8  4.6   Chloride 98 - 111 mmol/L 107  105  104   CO2 22 - 32 mmol/L 28  27  27    Calcium 8.9 - 10.3 mg/dL 9.7  9.6  9.6   Total Protein 6.5 - 8.1 g/dL 7.6   7.0   Total Bilirubin 0.0 - 1.2 mg/dL 0.6   1.0   Alkaline Phos 38 - 126 U/L 130   113   AST 15 - 41 U/L 27   34   ALT 0 - 44 U/L 32   36        ASSESSMENT & PLAN: [name] is a 68 y.o. female with         1. Malignant neoplasm of upper-outer quadrant of right breast, Stage IB, p(T1cN1aM0), ER+/PR+/HER2-, Grade II AND Left breast DCIS, grade II, ER+/PR+ (removed by biopsy) -diagnosed in 06/2020, s/p b/l lumpectomy and right SNLB by Dr Curvin on 07/26/20.  -Mammaprint showed high risk Luminal Type B with 29% risk of distant recurrence.  -She completed adjuvant chemo with TC.  Due to allergy reaction to docetaxel , it was changed to Abraxane  from cycle 3. -She received adjuvant radiation under Dr. Izell 7/25-01/15/21. -she started exemestane  in late 01/2021. She is receiving financial assistance through [employer] (see MyChart note from 06/11/21). -tolerated exemestane  from 03/2021 until 08/2021 with intolerable side effects including  generalized bone/joint pain, breast/pectoralis pain, fatigue, brain fog, mood disturbance and elevated BP.   -She switched to low-dose letrozole  in 10/22/2021 and increased to full dose in 12/2021 but did not tolerate it after 2-3 weeks and stopped 01/2022.  Main side effects were severe cramping and toes curling making it difficult to ambulate and poor quality of life.  SEs have improved since stopping AI. She declined to try tamoxifen  -Screening MRI 12/10/2022 and mammo 06/2023 were benign.   -Ms. Oros is clinically doing well, breast exam is benign, labs are unremarkable.  Suspect the breast tenderness is secondary to surgery, radiation, and dense breast tissue. Overall no clinical concern for recurrence -She will proceed with screening breast MRI and let me know which facility -Continue surveillance -Follow-up in 6 months, or sooner if needed   2. Bone Health -she reports her last DEXA was in 03/2019, showing osteopenia.  -repeat per Dr. McComb   3. Comorbidities: Depression, Fibromyalgia, HLD, HTN, Hypothyroidism, H/o Skin cancer (squamous and basal cell), sleep apnea -She is followed  for HTN. She takes her BP at home.  -On medications, continue to f/u with therapist and other physicians. -HTN uncontrolled lately, with recent ER visit for palpitations.  PCP adjusted medication; follow-up cardiology    4.  Fatty liver and transaminitis -She has chronic intermittent transaminitis, attributes to fatty liver.  She is on low sugar diet, drinks a lot of water , does not drink alcohol , and avoids Tylenol  -Liver morphology was unremarkable on staging CT CAP 4/22 -Her LFTs fluctuate, all normal lately -Continue monitoring    PLAN: -Labs reviewed  -Continue breast cancer surveillance  -Screening breast MRI, pt will let me know which facility to schedule  -Lab, f/up in 6 months, or sooner if needed     All questions were answered. The patient knows to call the clinic with any problems, questions or concerns. No barriers to learning were detected.   Jaimere Feutz K Satsuki Zillmer, NP 03/14/2024

## 2024-03-14 NOTE — Telephone Encounter (Signed)
 Called pt and scheduled form the los and patient is aware of her appt and time and date.

## 2024-03-16 DIAGNOSIS — I10 Essential (primary) hypertension: Secondary | ICD-10-CM | POA: Diagnosis not present

## 2024-03-28 DIAGNOSIS — H524 Presbyopia: Secondary | ICD-10-CM | POA: Diagnosis not present

## 2024-03-28 DIAGNOSIS — H35033 Hypertensive retinopathy, bilateral: Secondary | ICD-10-CM | POA: Diagnosis not present

## 2024-03-29 ENCOUNTER — Other Ambulatory Visit: Payer: Self-pay | Admitting: Internal Medicine

## 2024-04-01 ENCOUNTER — Ambulatory Visit: Admitting: Obstetrics and Gynecology

## 2024-04-13 ENCOUNTER — Encounter: Payer: Self-pay | Admitting: Nurse Practitioner

## 2024-04-14 ENCOUNTER — Ambulatory Visit: Payer: Self-pay

## 2024-04-14 NOTE — Telephone Encounter (Signed)
 FYI Only or Action Required?: FYI only for provider: appointment scheduled on 04/15/2024.  Patient was last seen in primary care on 03/09/2024 by Panosh, Apolinar POUR, MD.  Called Nurse Triage reporting Back Pain.  Symptoms began several weeks ago.  Interventions attempted: Nothing.  Symptoms are: unchanged.  Triage Disposition: See PCP When Office is Open (Within 3 Days)  Patient/caregiver understands and will follow disposition?: Yes  Copied from CRM #[id number]. Topic: Clinical - Red Word Triage >> Apr 14, 2024  4:59 PM [name] wrote: Red Word that prompted transfer to Nurse Triage: Patient has swollen lymph node for a month and back is hurting near kidney. Patient wants a kidney test done and an appointment.   ----------------------------------------------------------------------- From previous Reason for Contact - Scheduling: Patient/patient representative is calling to schedule an appointment. Refer to attachments for appointment information. Reason for Disposition  [1] Age > 50 AND [2] no history of prior similar back pain  Answer Assessment - Initial Assessment Questions Knot in neck.   1. ONSET: When did the pain begin?      6 weeks 2. LOCATION: Where does it hurt?      Left side 3. PATTERN Does the pain come and go, or has it been constant since it started?      Denies pain in neck 4. SEVERITY: How bad is the pain?  (Scale 0-10; or none or slight stiffness, mild, moderate, severe)     0 7. CAUSE: What do you think is causing the neck pain?     Has been evaluated and given different answers as to what this is.  9. OTHER SYMPTOMS: Do you have any other symptoms? (e.g., headache, fever, chest pain, difficulty breathing, neck swelling)     denies  Answer Assessment - Initial Assessment Questions Patient requesting urine test done.  1. ONSET: When did the pain begin? (e.g., minutes, hours, days)     A month ago 2. LOCATION: Where does it hurt? (upper,  mid or lower back)     Flank, sometimes lower back 3. SEVERITY: How bad is the pain?  (e.g., Scale 1-10; mild, moderate, or severe)     3 4. PATTERN: Is the pain constant? (e.g., yes, no; constant, intermittent)      Comes and goes 5. RADIATION: Does the pain shoot into your legs or somewhere else?     denies 6. CAUSE:  What do you think is causing the back pain?      Does have a siatica problems, but this feels a little higher 8. MEDICINES: What have you taken so far for the pain? (e.g., nothing, acetaminophen , NSAIDS)     denies 10. OTHER SYMPTOMS: Do you have any other symptoms? (e.g., fever, abdomen pain, burning with urination, blood in urine)     Darker urine recently.  Protocols used: Back Pain-A-AH, Neck Pain or Stiffness-A-AH

## 2024-04-15 ENCOUNTER — Ambulatory Visit (INDEPENDENT_AMBULATORY_CARE_PROVIDER_SITE_OTHER): Admitting: Family Medicine

## 2024-04-15 ENCOUNTER — Encounter: Payer: Self-pay | Admitting: Family Medicine

## 2024-04-15 VITALS — BP 152/80 | HR 85 | Temp 98.6°F | Ht 64.48 in | Wt 214.2 lb

## 2024-04-15 DIAGNOSIS — R591 Generalized enlarged lymph nodes: Secondary | ICD-10-CM

## 2024-04-15 DIAGNOSIS — M549 Dorsalgia, unspecified: Secondary | ICD-10-CM | POA: Diagnosis not present

## 2024-04-15 DIAGNOSIS — M797 Fibromyalgia: Secondary | ICD-10-CM | POA: Diagnosis not present

## 2024-04-15 NOTE — Progress Notes (Signed)
 "  Established Patient Office Visit   Subjective  Patient ID: [name], female    DOB: 11/16/1955  Age: 68 y.o. MRN: [id number]  Chief Complaint  Patient presents with   Acute Visit    Patient came in today for back pain, pain 3/10, concerned it might be her kidneys.    Patient is a 68 year old female followed by Dr. Charlett and seen for ongoing concerns. Pt endorses intermittent b/l low back pain x 6 wks.  Noted as dull and achy, 3/10.  Does not radiate.  Followed by chiropractor, went yesterday but it did not relieve pain.  Has not had to take anything for the pain.  Inquires if she is having problems with sciatica, liver, or her kidneys.  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  Pt anxious about health due to h/o breast ca.  Notes prior issues with fibromyalgia which improved with diet changes.  Pt has not been eating the best and drinking less water .  Noticing sensitivity in bilateral thighs similar to back.  Endorses an enlarged lymph node on left side of neck.    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   Hepatic steatosis 11/27/2014   Hx of adenomatous colonic polyps 11/27/2014   Visit for preventive health examination 03/28/2014   Hyperlipidemia 03/28/2014   Left rotator cuff tear 07/07/2013   Tear of left rotator cuff 07/07/2013   Rhinitis 06/01/2013   Chest wall deformity 04/12/2012   Atrial septal aneurysm / if pfo  echo 6 13  10/23/2011   OSA (obstructive sleep apnea) 11/27/2010   Dyslipidemia 11/01/2010   Flushing 08/29/2010   Labile hypertension 08/29/2010   MUSCLE CRAMPS, FOOT 06/10/2010   OTHER SLEEP DISTURBANCES 06/10/2010   ANXIETY, SITUATIONAL 05/08/2010   VERTIGO, POSITIONAL  03/15/2010   VITAMIN D  DEFICIENCY 02/18/2010   OBESITY 02/18/2010   ALLERGIC RHINITIS 02/18/2010   PLANTAR FASCIITIS 02/18/2010   TWITCHING 06/11/2009   NUMBNESS, HAND 06/11/2009   CERVICAL STRAIN, ACUTE 06/11/2009   OTHER MALAISE AND FATIGUE 07/09/2007   HYPERTENSION 01/28/2007   Hypothyroidism 11/11/2006   GERD 11/11/2006   FIBROMYALGIA 11/11/2006   Past Medical History:  Diagnosis Date   Anal fissure    Atrial septal aneurysm / if pfo  echo 6 13  10/23/2011   Breast cancer (HCC)    Cataract    both eyes   Depression    Dysrhythmia    Palpitations   Fatty liver    pre fatty liver   Fibromyalgia    Headache(784.0)    hx of migraines when younger   Heart palpitations    hx with normal holter event monitoring   Hx: UTI (urinary tract infection)    Hyperlipidemia    Hypertension    Hypothyroidism    Obesity    Personal history of chemotherapy    Personal history of radiation therapy    PFO (patent foramen ovale) 07/20/2017   PFO with right to left flow at rest on saline microcavitation study   Pneumonia 1972   hx of   Polyp of colon    Serrated adenoma of colon 08/2012   Skin cancer    basal, squamous cell   Sleep apnea  uses cpap   Past Surgical History:  Procedure Laterality Date   BREAST LUMPECTOMY     BREAST LUMPECTOMY WITH RADIOACTIVE SEED AND SENTINEL LYMPH NODE BIOPSY Right 07/26/2020   Procedure: RIGHT BREAST LUMPECTOMY WITH RADIOACTIVE SEED AND SENTINEL LYMPH NODE BIOPSY;  Surgeon: Curvin Deward MOULD, MD;  Location: [facility];  Service: General;  Laterality: Right;   BREAST LUMPECTOMY WITH RADIOACTIVE SEED LOCALIZATION Left 07/26/2020   Procedure: LEFT BREAST LUMPECTOMY X 2  WITH RADIOACTIVE SEED LOCALIZATION;  Surgeon: Curvin Deward MOULD, MD;  Location: [facility];  Service: General;  Laterality: Left;   CHOLECYSTECTOMY N/A 01/13/2024   Procedure: LAPAROSCOPIC CHOLECYSTECTOMY;  Surgeon: Curvin Deward MOULD, MD;  Location: [facility];  Service: General;  Laterality: N/A;    COLONOSCOPY     DILATION AND CURETTAGE OF UTERUS  1978   DRUG INDUCED ENDOSCOPY     ECTOPIC PREGNANCY SURGERY  1983   KNEE ARTHROSCOPY     both in past   POLYPECTOMY     SHOULDER OPEN ROTATOR CUFF REPAIR Left 07/07/2013   Procedure: LEFT SHOULDER MINI OPEN SUBACROMIAL DECOMPRESSION ROTATOR CUFF REPAIR AND POSSIBLE PATCH GRAFT ;  Surgeon: Reyes JAYSON Billing, MD;  Location: [facility];  Service: Orthopedics;  Laterality: Left;  with interscaline block   SKIN CANCER EXCISION Bilateral    arm, legs, and chest   TONSILLECTOMY     Social History   Tobacco Use   Smoking status: Never   Smokeless tobacco: Never  Vaping Use   Vaping status: Never Used  Substance Use Topics   Alcohol  use: Yes    Comment: rare   Drug use: No   Family History  Problem Relation Age of Onset   Hypertension Mother        low borderline   Osteoporosis Mother    Hypertension Father    Liver disease Father        amyloid deceased   Hyperlipidemia Father    Melanoma Sister    Juvenile Diabetes Daughter    ADD / ADHD Child    Hyperlipidemia Other        Maternal grandmother   Colon cancer Neg Hx    Stomach cancer Neg Hx    Colon polyps Neg Hx    Esophageal cancer Neg Hx    Rectal cancer Neg Hx    Bladder Cancer Neg Hx    Uterine cancer Neg Hx    Renal cancer Neg Hx    Allergies  Allergen Reactions   Penicillins Itching, Swelling and Other (See Comments)    REACTION: swelling up as an adult-68 yrs old REACTION: swelling up as a child   Taxotere  [Docetaxel ] Anaphylaxis    08/30/20 Constricted throat sensation of throat and tongue swelling and chest pressure Docetaxel  paused and given Pepcid  20 mg IV, Benadryl  25 mg IV, Ativan  0.5 mg IV x1.  Ativan  0.5 mg IV x1 was repeated after the restart of the patient's chemotherapy. The patient's symptoms abated and docetaxel  restarted.  Then had a recurrence of constriction of throat and was given Ativan  0.5 mg IV x1 for total dose of 1 mg of Ativan .  She was able to  complete her treatment with out any further issues or concerns.   Cefdinir Swelling, Itching and Rash    REACTION: Rash, swelling and itching   Cetirizine Other (See Comments)    unsure   Femara  [Letrozole ] Other (See Comments)    Pt reports severe muscle cramp and exhaustion.    Pegfilgrastim  Other (See  Comments)    Bad reaction- unspecified    Irbesartan  Other (See Comments)    See 1 20 note  Heart burn , dizziness when taken with diuretic  See 1 20 note  Heart burn , dizziness when taken with diuretic    Molds & Smuts Other (See Comments)    Mold and mildew   Tylenol  [Acetaminophen ] Other (See Comments)    Pre fatty liver: does not takes these    ROS Negative unless stated above    Objective:     BP (!) 152/80 (BP Location: Left Arm, Patient Position: Sitting, Cuff Size: Large)   Pulse 85   Temp 98.6 F (37 C) (Oral)   Ht 5' 4.48 (1.638 m)   Wt 214 lb 3.2 oz (97.2 kg)   LMP  (LMP Unknown)   SpO2 99%   BMI 36.22 kg/m  BP Readings from Last 3 Encounters:  04/15/24 (!) 152/80  03/14/24 (!) 160/100  03/09/24 (!) 152/86   Wt Readings from Last 3 Encounters:  04/15/24 214 lb 3.2 oz (97.2 kg)  03/14/24 208 lb 4.8 oz (94.5 kg)  03/09/24 209 lb 4.8 oz (94.9 kg)      Physical Exam Constitutional:      General: She is not in acute distress.    Appearance: Normal appearance.  HENT:     Head: Normocephalic and atraumatic.     Nose: Nose normal.     Mouth/Throat:     Mouth: Mucous membranes are moist.  Neck:      Comments: Round, mobile, 1.5 cm Cardiovascular:     Rate and Rhythm: Normal rate and regular rhythm.     Heart sounds: Normal heart sounds. No murmur heard.    No gallop.  Pulmonary:     Effort: Pulmonary effort is normal. No respiratory distress.     Breath sounds: Normal breath sounds. No wheezing, rhonchi or rales.  Skin:    General: Skin is warm and dry.  Neurological:     Mental Status: She is alert and oriented to person, place, and time.         03/14/2024   10:38 AM 11/04/2023   10:19 AM 10/29/2022    3:26 PM  Depression screen PHQ 2/9  Decreased Interest 0 0 0  Down, Depressed, Hopeless 0 0 0  PHQ - 2 Score 0 0 0  Altered sleeping   0  Tired, decreased energy   0  Change in appetite   0  Feeling bad or failure about yourself    0  Trouble concentrating   0  Moving slowly or fidgety/restless   0  Suicidal thoughts   0  PHQ-9 Score   0   Difficult doing work/chores   Not difficult at all     Data saved with a previous flowsheet row definition      09/12/2022   10:19 AM  GAD 7 : Generalized Anxiety Score  Nervous, Anxious, on Edge 1  Control/stop worrying 0  Worry too much - different things 0  Trouble relaxing 0  Restless 0  Easily annoyed or irritable 0  Afraid - awful might happen 0  Total GAD 7 Score 1  Anxiety Difficulty Not difficult at all     No results found for any visits on 04/15/24.    Assessment & Plan:   Back pain, unspecified back location, unspecified back pain laterality, unspecified chronicity -     POCT Urinalysis Dipstick (Automated)  Fibromyalgia  Lymphadenopathy -  US  SOFT TISSUE HEAD & NECK (NON-THYROID ); Future  Ongoing back pain x 6 weeks with dark urine.  Discussed possible causes including UTI, pyelo-, muscle strain, kidney stones, worsening cyst.  POC UA.  Recent increased sensitivity in extremities possibly related to fibromyalgia.  Continue supportive care.  Enlarged left cervical lymph node.  Obtain ultrasound to evaluate further given history of cancer.  Further recommendations if needed based on results.  Follow-up in clinic with PCP for continued or worsening symptoms.  Given precautions.  Return if symptoms worsen or fail to improve.   Clotilda JONELLE Single, MD "

## 2024-04-21 DIAGNOSIS — G5603 Carpal tunnel syndrome, bilateral upper limbs: Secondary | ICD-10-CM | POA: Diagnosis not present

## 2024-04-21 DIAGNOSIS — G603 Idiopathic progressive neuropathy: Secondary | ICD-10-CM | POA: Diagnosis not present

## 2024-04-21 DIAGNOSIS — M7912 Myalgia of auxiliary muscles, head and neck: Secondary | ICD-10-CM | POA: Diagnosis not present

## 2024-04-21 DIAGNOSIS — M7918 Myalgia, other site: Secondary | ICD-10-CM | POA: Diagnosis not present

## 2024-04-21 DIAGNOSIS — F419 Anxiety disorder, unspecified: Secondary | ICD-10-CM | POA: Diagnosis not present

## 2024-04-26 ENCOUNTER — Inpatient Hospital Stay: Admission: RE | Admit: 2024-04-26 | Discharge: 2024-04-26 | Attending: Family Medicine

## 2024-04-26 DIAGNOSIS — R221 Localized swelling, mass and lump, neck: Secondary | ICD-10-CM | POA: Diagnosis not present

## 2024-04-26 DIAGNOSIS — R591 Generalized enlarged lymph nodes: Secondary | ICD-10-CM

## 2024-04-28 DIAGNOSIS — R5383 Other fatigue: Secondary | ICD-10-CM | POA: Diagnosis not present

## 2024-04-28 DIAGNOSIS — N951 Menopausal and female climacteric states: Secondary | ICD-10-CM | POA: Diagnosis not present

## 2024-04-28 DIAGNOSIS — Z5181 Encounter for therapeutic drug level monitoring: Secondary | ICD-10-CM | POA: Diagnosis not present

## 2024-04-28 DIAGNOSIS — E039 Hypothyroidism, unspecified: Secondary | ICD-10-CM | POA: Diagnosis not present

## 2024-04-28 DIAGNOSIS — E559 Vitamin D deficiency, unspecified: Secondary | ICD-10-CM | POA: Diagnosis not present

## 2024-04-28 DIAGNOSIS — K760 Fatty (change of) liver, not elsewhere classified: Secondary | ICD-10-CM | POA: Diagnosis not present

## 2024-04-28 DIAGNOSIS — R790 Abnormal level of blood mineral: Secondary | ICD-10-CM | POA: Diagnosis not present

## 2024-04-28 DIAGNOSIS — N959 Unspecified menopausal and perimenopausal disorder: Secondary | ICD-10-CM | POA: Diagnosis not present

## 2024-04-28 DIAGNOSIS — Z79899 Other long term (current) drug therapy: Secondary | ICD-10-CM | POA: Diagnosis not present

## 2024-04-28 DIAGNOSIS — R7989 Other specified abnormal findings of blood chemistry: Secondary | ICD-10-CM | POA: Diagnosis not present

## 2024-04-29 ENCOUNTER — Ambulatory Visit: Payer: Self-pay | Admitting: Family Medicine

## 2024-04-29 ENCOUNTER — Telehealth: Payer: Self-pay | Admitting: Family Medicine

## 2024-04-29 DIAGNOSIS — R590 Localized enlarged lymph nodes: Secondary | ICD-10-CM

## 2024-04-29 NOTE — Telephone Encounter (Signed)
 Patient called regarding results of ultrasound soft tissue head & neck 04/26/24.  2.2 cm left neck mass suspicious for metastatic lymphadenopathy.  Further eval with contrast enhanced CT neck recommended.  Patient advised to contact her oncologist regarding this.  Will place order for CT.  Follow-up with PCP and oncology.  Frances Single, MD

## 2024-05-02 NOTE — Progress Notes (Addendum)
 TRISH MANCINELLI                                          MRN: [id number]   05/02/2024   The [facility] Quality Team Specialist reviewed this patient medical record for the purposes of chart review for care gap closure. The following were reviewed: chart review for care gap closure-controlling blood pressure.  06/07/2024- NO CBP TO CLOSE 2025    [facility] Quality Team

## 2024-05-03 ENCOUNTER — Ambulatory Visit
Admission: RE | Admit: 2024-05-03 | Discharge: 2024-05-03 | Disposition: A | Discharge status: Home / Self Care | Source: Ambulatory Visit | Attending: Family Medicine | Admitting: Family Medicine

## 2024-05-03 ENCOUNTER — Other Ambulatory Visit

## 2024-05-03 DIAGNOSIS — R590 Localized enlarged lymph nodes: Secondary | ICD-10-CM

## 2024-05-03 MED ORDER — IOPAMIDOL (ISOVUE-300) INJECTION 61%
75.0000 mL | Freq: Once | INTRAVENOUS | Status: AC | PRN
  Administered 2024-05-03: 75 mL via INTRAVENOUS

## 2024-05-10 LAB — POC URINALSYSI DIPSTICK (AUTOMATED)
Bilirubin, UA: NEGATIVE
Blood, UA: NEGATIVE
Glucose, UA: NEGATIVE
Ketones, UA: NEGATIVE
Leukocytes, UA: NEGATIVE
Nitrite, UA: NEGATIVE
Protein, UA: POSITIVE — AB
Spec Grav, UA: 1.025
Urobilinogen, UA: 0.2 U/dL
pH, UA: 6

## 2024-05-16 ENCOUNTER — Other Ambulatory Visit: Payer: Self-pay

## 2024-05-23 ENCOUNTER — Other Ambulatory Visit: Payer: Self-pay | Admitting: Internal Medicine

## 2024-05-23 ENCOUNTER — Encounter: Payer: Self-pay | Admitting: *Deleted

## 2024-05-23 ENCOUNTER — Ambulatory Visit: Payer: Self-pay | Admitting: Family Medicine

## 2024-05-23 ENCOUNTER — Ambulatory Visit: Payer: Medicare (Managed Care) | Attending: Nurse Practitioner

## 2024-05-23 VITALS — Wt 219.2 lb

## 2024-05-23 DIAGNOSIS — Z483 Aftercare following surgery for neoplasm: Secondary | ICD-10-CM | POA: Insufficient documentation

## 2024-05-23 DIAGNOSIS — Z853 Personal history of malignant neoplasm of breast: Secondary | ICD-10-CM

## 2024-05-23 NOTE — Therapy (Signed)
 " OUTPATIENT PHYSICAL THERAPY SOZO SCREENING NOTE   Patient Name: [name] MRN: [id number] DOB:March 27, 1956, 69 y.o., female Today's Date: 05/23/2024  PCP: Charlett Apolinar POUR, MD REFERRING PROVIDER: Ann Mayme POUR, NP   PT End of Session - 05/23/24 1007     Visit Number 9   # unchanged due to screen only   PT Start Time 1005    PT Stop Time 1009    PT Time Calculation (min) 4 min    Activity Tolerance Patient tolerated treatment well    Behavior During Therapy WFL for tasks assessed/performed          Past Medical History:  Diagnosis Date   Anal fissure    Atrial septal aneurysm / if pfo  echo 6 13  10/23/2011   Breast cancer (HCC)    Cataract    both eyes   Depression    Dysrhythmia    Palpitations   Fatty liver    pre fatty liver   Fibromyalgia    Headache(784.0)    hx of migraines when younger   Heart palpitations    hx with normal holter event monitoring   Hx: UTI (urinary tract infection)    Hyperlipidemia    Hypertension    Hypothyroidism    Obesity    Personal history of chemotherapy    Personal history of radiation therapy    PFO (patent foramen ovale) 07/20/2017   PFO with right to left flow at rest on saline microcavitation study   Pneumonia 1972   hx of   Polyp of colon    Serrated adenoma of colon 08/2012   Skin cancer    basal, squamous cell   Sleep apnea    uses cpap   Past Surgical History:  Procedure Laterality Date   BREAST LUMPECTOMY     BREAST LUMPECTOMY WITH RADIOACTIVE SEED AND SENTINEL LYMPH NODE BIOPSY Right 07/26/2020   Procedure: RIGHT BREAST LUMPECTOMY WITH RADIOACTIVE SEED AND SENTINEL LYMPH NODE BIOPSY;  Surgeon: Curvin Deward MOULD, MD;  Location: [facility];  Service: General;  Laterality: Right;   BREAST LUMPECTOMY WITH RADIOACTIVE SEED LOCALIZATION Left 07/26/2020   Procedure: LEFT BREAST LUMPECTOMY X 2  WITH RADIOACTIVE SEED LOCALIZATION;  Surgeon: Curvin Deward MOULD, MD;  Location: [facility];  Service: General;  Laterality: Left;    CHOLECYSTECTOMY N/A 01/13/2024   Procedure: LAPAROSCOPIC CHOLECYSTECTOMY;  Surgeon: Curvin Deward MOULD, MD;  Location: [facility];  Service: General;  Laterality: N/A;   COLONOSCOPY     DILATION AND CURETTAGE OF UTERUS  1978   DRUG INDUCED ENDOSCOPY     ECTOPIC PREGNANCY SURGERY  1983   KNEE ARTHROSCOPY     both in past   POLYPECTOMY     SHOULDER OPEN ROTATOR CUFF REPAIR Left 07/07/2013   Procedure: LEFT SHOULDER MINI OPEN SUBACROMIAL DECOMPRESSION ROTATOR CUFF REPAIR AND POSSIBLE PATCH GRAFT ;  Surgeon: Reyes JAYSON Billing, MD;  Location: [facility];  Service: Orthopedics;  Laterality: Left;  with interscaline block   SKIN CANCER EXCISION Bilateral    arm, legs, and chest   TONSILLECTOMY     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   Hepatic steatosis 11/27/2014   Hx of adenomatous colonic polyps 11/27/2014   Visit for preventive health examination 03/28/2014   Hyperlipidemia 03/28/2014  Left rotator cuff tear 07/07/2013   Tear of left rotator cuff 07/07/2013   Rhinitis 06/01/2013   Chest wall deformity 04/12/2012   Atrial septal aneurysm / if pfo  echo 6 13  10/23/2011   OSA (obstructive sleep apnea) 11/27/2010   Dyslipidemia 11/01/2010   Flushing 08/29/2010   Labile hypertension 08/29/2010   MUSCLE CRAMPS, FOOT 06/10/2010   OTHER SLEEP DISTURBANCES 06/10/2010   ANXIETY, SITUATIONAL 05/08/2010   VERTIGO, POSITIONAL 03/15/2010   VITAMIN D  DEFICIENCY 02/18/2010   OBESITY 02/18/2010   ALLERGIC RHINITIS 02/18/2010   PLANTAR FASCIITIS 02/18/2010   TWITCHING 06/11/2009   NUMBNESS, HAND 06/11/2009   CERVICAL STRAIN, ACUTE 06/11/2009   OTHER MALAISE AND FATIGUE 07/09/2007   HYPERTENSION 01/28/2007   Hypothyroidism 11/11/2006   GERD 11/11/2006   FIBROMYALGIA 11/11/2006     REFERRING DIAG: Rt breast cancer  THERAPY DIAG: Aftercare following surgery for neoplasm  PERTINENT HISTORY:   Patient was diagnosed on 05/22/2020 with 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 She underwent a left lumpectomy for DCIS and a right lumpectomy and sentinel node biopsy on 07/26/2020. She had 2 lymph nodes removed with 1 being positive for cancer on the right side. It is ER/PR positive. Left shoulder rotator cuff repair 06/2013. Last radiation on 01/15/21.  Seroma that was drained x 2-3.     PRECAUTIONS: Rt UE lymphedema risk  SUBJECTIVE: Pt returns for her 6 month L-Dex screen.   PAIN: Are you having pain? No  SOZO SCREENING: Patient was assessed today using the SOZO machine to determine the lymphedema index score. This was compared to her baseline score. It was determined that she is within the recommended range when compared to her baseline and no further action is needed at this time. She will return in 3 months for her next SOZO screen.  Plan: Pt will begin 6 month screens.   L-DEX FLOWSHEETS - 05/23/24 1000       L-DEX LYMPHEDEMA SCREENING   Measurement Type Unilateral    L-DEX MEASUREMENT EXTREMITY Upper Extremity    POSITION  Standing    DOMINANT SIDE Right    At Risk Side Right    BASELINE SCORE (UNILATERAL) 3.5    L-DEX SCORE (UNILATERAL) 3    VALUE CHANGE (UNILAT) -0.5         P: Cont every 6 months L-Dex screen until 07/2024 then can transition to annual.   Berwyn Knights, PTA 05/23/2024 10:08 AM       "

## 2024-06-15 ENCOUNTER — Ambulatory Visit
Admission: RE | Admit: 2024-06-15 | Discharge: 2024-06-15 | Disposition: A | Payer: Medicare (Managed Care) | Source: Ambulatory Visit | Attending: Internal Medicine | Admitting: Internal Medicine

## 2024-06-15 DIAGNOSIS — Z853 Personal history of malignant neoplasm of breast: Secondary | ICD-10-CM

## 2024-09-12 ENCOUNTER — Inpatient Hospital Stay

## 2024-09-12 ENCOUNTER — Inpatient Hospital Stay: Admitting: Nurse Practitioner

## 2024-11-09 ENCOUNTER — Ambulatory Visit

## 2024-11-21 ENCOUNTER — Ambulatory Visit: Payer: Medicare (Managed Care)
